# Patient Record
Sex: Female | Born: 1945
Health system: Southern US, Community
[De-identification: ages and names within clinical notes are randomized; demographics above are authoritative.]

## PROBLEM LIST (undated history)

## (undated) DIAGNOSIS — N200 Calculus of kidney: Secondary | ICD-10-CM

## (undated) DIAGNOSIS — Z7901 Long term (current) use of anticoagulants: Secondary | ICD-10-CM

## (undated) DIAGNOSIS — R1013 Epigastric pain: Secondary | ICD-10-CM

## (undated) DIAGNOSIS — I5042 Chronic combined systolic (congestive) and diastolic (congestive) heart failure: Secondary | ICD-10-CM

## (undated) DIAGNOSIS — R079 Chest pain, unspecified: Secondary | ICD-10-CM

## (undated) DIAGNOSIS — K2289 Other specified disease of esophagus: Secondary | ICD-10-CM

## (undated) DIAGNOSIS — I10 Essential (primary) hypertension: Secondary | ICD-10-CM

## (undated) DIAGNOSIS — A419 Sepsis, unspecified organism: Secondary | ICD-10-CM

## (undated) DIAGNOSIS — I5032 Chronic diastolic (congestive) heart failure: Secondary | ICD-10-CM

## (undated) DIAGNOSIS — I5031 Acute diastolic (congestive) heart failure: Secondary | ICD-10-CM

## (undated) DIAGNOSIS — C519 Malignant neoplasm of vulva, unspecified: Secondary | ICD-10-CM

## (undated) DIAGNOSIS — D122 Benign neoplasm of ascending colon: Secondary | ICD-10-CM

## (undated) DIAGNOSIS — K921 Melena: Secondary | ICD-10-CM

## (undated) DIAGNOSIS — Z87442 Personal history of urinary calculi: Secondary | ICD-10-CM

## (undated) DIAGNOSIS — J449 Chronic obstructive pulmonary disease, unspecified: Secondary | ICD-10-CM

## (undated) DIAGNOSIS — I160 Hypertensive urgency: Secondary | ICD-10-CM

## (undated) DIAGNOSIS — A630 Anogenital (venereal) warts: Secondary | ICD-10-CM

## (undated) DIAGNOSIS — Z9289 Personal history of other medical treatment: Secondary | ICD-10-CM

## (undated) DIAGNOSIS — G4733 Obstructive sleep apnea (adult) (pediatric): Secondary | ICD-10-CM

## (undated) DIAGNOSIS — I482 Chronic atrial fibrillation, unspecified: Secondary | ICD-10-CM

## (undated) DIAGNOSIS — I4891 Unspecified atrial fibrillation: Secondary | ICD-10-CM

## (undated) DIAGNOSIS — N209 Urinary calculus, unspecified: Secondary | ICD-10-CM

## (undated) DIAGNOSIS — R0600 Dyspnea, unspecified: Secondary | ICD-10-CM

## (undated) DIAGNOSIS — K219 Gastro-esophageal reflux disease without esophagitis: Secondary | ICD-10-CM

## (undated) DIAGNOSIS — E669 Obesity, unspecified: Secondary | ICD-10-CM

## (undated) DIAGNOSIS — I509 Heart failure, unspecified: Secondary | ICD-10-CM

## (undated) DIAGNOSIS — D124 Benign neoplasm of descending colon: Secondary | ICD-10-CM

## (undated) DIAGNOSIS — Z8489 Family history of other specified conditions: Secondary | ICD-10-CM

## (undated) DIAGNOSIS — R112 Nausea with vomiting, unspecified: Secondary | ICD-10-CM

## (undated) DIAGNOSIS — I4819 Other persistent atrial fibrillation: Secondary | ICD-10-CM

## (undated) DIAGNOSIS — R0602 Shortness of breath: Secondary | ICD-10-CM

## (undated) DIAGNOSIS — E88819 Insulin resistance, unspecified: Secondary | ICD-10-CM

## (undated) DIAGNOSIS — I071 Rheumatic tricuspid insufficiency: Secondary | ICD-10-CM

## (undated) DIAGNOSIS — I340 Nonrheumatic mitral (valve) insufficiency: Secondary | ICD-10-CM

## (undated) DIAGNOSIS — M1990 Unspecified osteoarthritis, unspecified site: Secondary | ICD-10-CM

## (undated) DIAGNOSIS — K228 Other specified diseases of esophagus: Secondary | ICD-10-CM

## (undated) DIAGNOSIS — K5733 Diverticulitis of large intestine without perforation or abscess with bleeding: Secondary | ICD-10-CM

## (undated) DIAGNOSIS — IMO0002 Reserved for concepts with insufficient information to code with codable children: Secondary | ICD-10-CM

## (undated) DIAGNOSIS — K5792 Diverticulitis of intestine, part unspecified, without perforation or abscess without bleeding: Secondary | ICD-10-CM

## (undated) DIAGNOSIS — I119 Hypertensive heart disease without heart failure: Secondary | ICD-10-CM

## (undated) DIAGNOSIS — Z9989 Dependence on other enabling machines and devices: Secondary | ICD-10-CM

## (undated) DIAGNOSIS — C801 Malignant (primary) neoplasm, unspecified: Secondary | ICD-10-CM

## (undated) DIAGNOSIS — D125 Benign neoplasm of sigmoid colon: Secondary | ICD-10-CM

## (undated) DIAGNOSIS — K2970 Gastritis, unspecified, without bleeding: Secondary | ICD-10-CM

## (undated) DIAGNOSIS — I5033 Acute on chronic diastolic (congestive) heart failure: Secondary | ICD-10-CM

## (undated) DIAGNOSIS — R42 Dizziness and giddiness: Secondary | ICD-10-CM

## (undated) DIAGNOSIS — R931 Abnormal findings on diagnostic imaging of heart and coronary circulation: Secondary | ICD-10-CM

## (undated) DIAGNOSIS — E8881 Metabolic syndrome: Secondary | ICD-10-CM

## (undated) DIAGNOSIS — Z9889 Other specified postprocedural states: Secondary | ICD-10-CM

## (undated) DIAGNOSIS — I493 Ventricular premature depolarization: Secondary | ICD-10-CM

## (undated) HISTORY — DX: Reserved for concepts with insufficient information to code with codable children: IMO0002

## (undated) HISTORY — DX: Benign neoplasm of sigmoid colon: D12.5

## (undated) HISTORY — PX: TONSILLECTOMY: SUR1361

## (undated) HISTORY — DX: Anogenital (venereal) warts: A63.0

## (undated) HISTORY — DX: Malignant neoplasm of vulva, unspecified: C51.9

## (undated) HISTORY — PX: CARDIOVERSION: SHX1299

## (undated) HISTORY — DX: Acute diastolic (congestive) heart failure: I50.31

## (undated) HISTORY — DX: Chronic obstructive pulmonary disease, unspecified: J44.9

## (undated) HISTORY — DX: Essential (primary) hypertension: I10

## (undated) HISTORY — DX: Epigastric pain: R10.13

## (undated) HISTORY — DX: Hypertensive urgency: I16.0

## (undated) HISTORY — DX: Malignant (primary) neoplasm, unspecified: C80.1

## (undated) HISTORY — DX: Chronic diastolic (congestive) heart failure: I50.32

## (undated) HISTORY — DX: Sepsis, unspecified organism: A41.9

## (undated) HISTORY — DX: Other specified diseases of esophagus: K22.8

## (undated) HISTORY — DX: Long term (current) use of anticoagulants: Z79.01

## (undated) HISTORY — DX: Heart failure, unspecified: I50.9

## (undated) HISTORY — DX: Acute on chronic diastolic (congestive) heart failure: I50.33

## (undated) HISTORY — DX: Nausea with vomiting, unspecified: R11.2

## (undated) HISTORY — DX: Ventricular premature depolarization: I49.3

## (undated) HISTORY — DX: Gastritis, unspecified, without bleeding: K29.70

## (undated) HISTORY — DX: Obesity, unspecified: E66.9

## (undated) HISTORY — DX: Benign neoplasm of descending colon: D12.4

## (undated) HISTORY — DX: Abnormal findings on diagnostic imaging of heart and coronary circulation: R93.1

## (undated) HISTORY — DX: Other specified disease of esophagus: K22.89

## (undated) HISTORY — DX: Metabolic syndrome: E88.81

## (undated) HISTORY — PX: LITHOTRIPSY: SUR834

## (undated) HISTORY — DX: Chest pain, unspecified: R07.9

## (undated) HISTORY — DX: Insulin resistance, unspecified: E88.819

## (undated) HISTORY — DX: Dizziness and giddiness: R42

## (undated) HISTORY — DX: Diverticulitis of intestine, part unspecified, without perforation or abscess without bleeding: K57.92

## (undated) HISTORY — DX: Melena: K92.1

## (undated) HISTORY — DX: Nonrheumatic mitral (valve) insufficiency: I34.0

## (undated) HISTORY — DX: Other persistent atrial fibrillation: I48.19

## (undated) HISTORY — DX: Obstructive sleep apnea (adult) (pediatric): G47.33

## (undated) HISTORY — PX: APPENDECTOMY: SHX54

## (undated) HISTORY — PX: ABDOMINAL HYSTERECTOMY: SHX81

## (undated) HISTORY — DX: Other specified postprocedural states: Z98.890

## (undated) HISTORY — DX: Personal history of other medical treatment: Z92.89

## (undated) HISTORY — DX: Shortness of breath: R06.02

## (undated) HISTORY — DX: Benign neoplasm of ascending colon: D12.2

## (undated) HISTORY — DX: Chronic combined systolic (congestive) and diastolic (congestive) heart failure: I50.42

## (undated) HISTORY — DX: Urinary calculus, unspecified: N20.9

## (undated) HISTORY — DX: Hypertensive heart disease without heart failure: I11.9

## (undated) HISTORY — DX: Diverticulitis of large intestine without perforation or abscess with bleeding: K57.33

## (undated) HISTORY — DX: Rheumatic tricuspid insufficiency: I07.1

---

## 1981-03-22 DIAGNOSIS — R112 Nausea with vomiting, unspecified: Secondary | ICD-10-CM

## 1981-03-22 HISTORY — DX: Other specified postprocedural states: R11.2

## 2000-05-13 ENCOUNTER — Emergency Department (HOSPITAL_COMMUNITY): Admission: EM | Admit: 2000-05-13 | Discharge: 2000-05-13 | Payer: Self-pay | Admitting: Emergency Medicine

## 2000-05-13 ENCOUNTER — Encounter: Payer: Self-pay | Admitting: Emergency Medicine

## 2000-05-23 ENCOUNTER — Encounter: Payer: Self-pay | Admitting: Urology

## 2000-05-23 ENCOUNTER — Ambulatory Visit (HOSPITAL_BASED_OUTPATIENT_CLINIC_OR_DEPARTMENT_OTHER): Admission: RE | Admit: 2000-05-23 | Discharge: 2000-05-23 | Payer: Self-pay | Admitting: Urology

## 2000-05-23 ENCOUNTER — Encounter: Admission: RE | Admit: 2000-05-23 | Discharge: 2000-05-23 | Payer: Self-pay | Admitting: Urology

## 2000-05-26 ENCOUNTER — Encounter: Payer: Self-pay | Admitting: Urology

## 2000-05-26 ENCOUNTER — Ambulatory Visit (HOSPITAL_COMMUNITY): Admission: RE | Admit: 2000-05-26 | Discharge: 2000-05-26 | Payer: Self-pay | Admitting: Urology

## 2000-06-16 ENCOUNTER — Encounter: Payer: Self-pay | Admitting: Urology

## 2000-06-16 ENCOUNTER — Ambulatory Visit (HOSPITAL_COMMUNITY): Admission: RE | Admit: 2000-06-16 | Discharge: 2000-06-16 | Payer: Self-pay | Admitting: Urology

## 2000-09-27 ENCOUNTER — Ambulatory Visit (HOSPITAL_COMMUNITY): Admission: RE | Admit: 2000-09-27 | Discharge: 2000-09-27 | Payer: Self-pay

## 2000-09-27 ENCOUNTER — Other Ambulatory Visit: Admission: RE | Admit: 2000-09-27 | Discharge: 2000-09-27 | Payer: Self-pay | Admitting: Obstetrics and Gynecology

## 2001-03-22 HISTORY — PX: BILATERAL SALPINGOOPHORECTOMY: SHX1223

## 2001-04-06 ENCOUNTER — Inpatient Hospital Stay (HOSPITAL_COMMUNITY): Admission: AD | Admit: 2001-04-06 | Discharge: 2001-04-09 | Payer: Self-pay | Admitting: Obstetrics and Gynecology

## 2001-04-06 ENCOUNTER — Encounter: Payer: Self-pay | Admitting: Obstetrics and Gynecology

## 2004-06-08 ENCOUNTER — Ambulatory Visit: Payer: Self-pay | Admitting: Internal Medicine

## 2004-06-15 ENCOUNTER — Ambulatory Visit: Payer: Self-pay | Admitting: Internal Medicine

## 2004-07-07 ENCOUNTER — Emergency Department (HOSPITAL_COMMUNITY): Admission: EM | Admit: 2004-07-07 | Discharge: 2004-07-08 | Payer: Self-pay | Admitting: *Deleted

## 2006-06-02 ENCOUNTER — Ambulatory Visit: Payer: Self-pay | Admitting: Family Medicine

## 2006-07-21 ENCOUNTER — Ambulatory Visit: Payer: Self-pay | Admitting: Gastroenterology

## 2007-03-13 ENCOUNTER — Ambulatory Visit: Payer: Self-pay | Admitting: Family Medicine

## 2009-01-09 ENCOUNTER — Ambulatory Visit: Payer: Self-pay | Admitting: Family Medicine

## 2010-04-12 ENCOUNTER — Encounter: Payer: Self-pay | Admitting: *Deleted

## 2012-04-10 ENCOUNTER — Ambulatory Visit: Payer: Self-pay | Admitting: Family Medicine

## 2013-04-16 ENCOUNTER — Ambulatory Visit: Payer: Self-pay | Admitting: Obstetrics and Gynecology

## 2013-04-16 LAB — BASIC METABOLIC PANEL
Anion Gap: 5 — ABNORMAL LOW (ref 7–16)
BUN: 18 mg/dL (ref 7–18)
Calcium, Total: 9.6 mg/dL (ref 8.5–10.1)
Chloride: 103 mmol/L (ref 98–107)
Co2: 27 mmol/L (ref 21–32)
Creatinine: 0.77 mg/dL (ref 0.60–1.30)
EGFR (African American): >60
EGFR (Non-African Amer.): >60
Glucose: 87 mg/dL (ref 65–99)
Osmolality: 271 (ref 275–301)
Potassium: 3.8 mmol/L (ref 3.5–5.1)
Sodium: 135 mmol/L — ABNORMAL LOW (ref 136–145)

## 2013-04-16 LAB — HEMOGLOBIN: HGB: 15.3 g/dL (ref 12.0–16.0)

## 2013-04-23 DIAGNOSIS — R931 Abnormal findings on diagnostic imaging of heart and coronary circulation: Secondary | ICD-10-CM | POA: Insufficient documentation

## 2013-04-23 DIAGNOSIS — I340 Nonrheumatic mitral (valve) insufficiency: Secondary | ICD-10-CM

## 2013-04-23 DIAGNOSIS — I071 Rheumatic tricuspid insufficiency: Secondary | ICD-10-CM

## 2013-04-23 HISTORY — DX: Nonrheumatic mitral (valve) insufficiency: I34.0

## 2013-04-23 HISTORY — DX: Rheumatic tricuspid insufficiency: I07.1

## 2013-06-19 ENCOUNTER — Inpatient Hospital Stay: Payer: Self-pay | Admitting: Cardiology

## 2013-06-19 LAB — CBC WITH DIFFERENTIAL/PLATELET
BASOS PCT: 0.9 %
Basophil #: 0.1 10*3/uL (ref 0.0–0.1)
Eosinophil #: 0.1 10*3/uL (ref 0.0–0.7)
Eosinophil %: 0.8 %
HCT: 43.2 % (ref 35.0–47.0)
HGB: 13.8 g/dL (ref 12.0–16.0)
Lymphocyte #: 2.9 10*3/uL (ref 1.0–3.6)
Lymphocyte %: 22.2 %
MCH: 30.2 pg (ref 26.0–34.0)
MCHC: 31.8 g/dL — AB (ref 32.0–36.0)
MCV: 95 fL (ref 80–100)
Monocyte #: 1.3 x10 3/mm — ABNORMAL HIGH (ref 0.2–0.9)
Monocyte %: 9.8 %
Neutrophil #: 8.6 10*3/uL — ABNORMAL HIGH (ref 1.4–6.5)
Neutrophil %: 66.3 %
Platelet: 259 10*3/uL (ref 150–440)
RBC: 4.56 10*6/uL (ref 3.80–5.20)
RDW: 14.4 % (ref 11.5–14.5)
WBC: 13 10*3/uL — ABNORMAL HIGH (ref 3.6–11.0)

## 2013-06-19 LAB — BASIC METABOLIC PANEL
Anion Gap: 7 (ref 7–16)
BUN: 20 mg/dL — AB (ref 7–18)
CHLORIDE: 105 mmol/L (ref 98–107)
Calcium, Total: 8.9 mg/dL (ref 8.5–10.1)
Co2: 27 mmol/L (ref 21–32)
Creatinine: 1.12 mg/dL (ref 0.60–1.30)
EGFR (African American): 58 — ABNORMAL LOW
EGFR (Non-African Amer.): 50 — ABNORMAL LOW
Glucose: 166 mg/dL — ABNORMAL HIGH (ref 65–99)
Osmolality: 284 (ref 275–301)
Potassium: 3.9 mmol/L (ref 3.5–5.1)
Sodium: 139 mmol/L (ref 136–145)

## 2013-06-19 LAB — CREATININE, SERUM
Creatinine: 1.06 mg/dL (ref 0.60–1.30)
EGFR (Non-African Amer.): 54 — ABNORMAL LOW

## 2013-06-20 LAB — TROPONIN I
TROPONIN-I: 0.04 ng/mL
Troponin-I: 0.07 ng/mL — ABNORMAL HIGH
Troponin-I: 0.04 ng/mL

## 2013-06-20 LAB — CK TOTAL AND CKMB (NOT AT ARMC)
CK, Total: 44 U/L
CK, Total: 40 U/L
CK-MB: 1.5 ng/mL (ref 0.5–3.6)
CK-MB: 1.4 ng/mL (ref 0.5–3.6)

## 2013-06-21 DIAGNOSIS — I509 Heart failure, unspecified: Secondary | ICD-10-CM

## 2013-06-21 DIAGNOSIS — Z9289 Personal history of other medical treatment: Secondary | ICD-10-CM | POA: Insufficient documentation

## 2013-06-21 LAB — CBC WITH DIFFERENTIAL/PLATELET
BASOS ABS: 0.1 10*3/uL (ref 0.0–0.1)
Basophil %: 0.8 %
Eosinophil #: 0.1 10*3/uL (ref 0.0–0.7)
Eosinophil %: 1.6 %
HCT: 41.9 % (ref 35.0–47.0)
HGB: 13.6 g/dL (ref 12.0–16.0)
Lymphocyte #: 2.5 10*3/uL (ref 1.0–3.6)
Lymphocyte %: 26.6 %
MCH: 30.3 pg (ref 26.0–34.0)
MCHC: 32.5 g/dL (ref 32.0–36.0)
MCV: 93 fL (ref 80–100)
Monocyte #: 1.1 x10 3/mm — ABNORMAL HIGH (ref 0.2–0.9)
Monocyte %: 11.7 %
NEUTROS ABS: 5.7 10*3/uL (ref 1.4–6.5)
Neutrophil %: 59.3 %
Platelet: 200 10*3/uL (ref 150–440)
RBC: 4.49 10*6/uL (ref 3.80–5.20)
RDW: 14 % (ref 11.5–14.5)
WBC: 9.6 10*3/uL (ref 3.6–11.0)

## 2013-06-21 LAB — BASIC METABOLIC PANEL
Anion Gap: 5 — ABNORMAL LOW (ref 7–16)
BUN: 18 mg/dL (ref 7–18)
CALCIUM: 8.5 mg/dL (ref 8.5–10.1)
CHLORIDE: 101 mmol/L (ref 98–107)
Co2: 34 mmol/L — ABNORMAL HIGH (ref 21–32)
Creatinine: 1.01 mg/dL (ref 0.60–1.30)
EGFR (African American): >60
EGFR (Non-African Amer.): 57 — ABNORMAL LOW
Glucose: 97 mg/dL (ref 65–99)
Osmolality: 281 (ref 275–301)
Potassium: 3.3 mmol/L — ABNORMAL LOW (ref 3.5–5.1)
SODIUM: 140 mmol/L (ref 136–145)

## 2013-06-21 LAB — CK TOTAL AND CKMB (NOT AT ARMC)
CK, Total: 34 U/L
CK-MB: 1.2 ng/mL (ref 0.5–3.6)

## 2013-06-21 LAB — TROPONIN I: Troponin-I: 0.03 ng/mL

## 2013-06-21 LAB — MAGNESIUM: Magnesium: 1.8 mg/dL

## 2013-06-22 LAB — BASIC METABOLIC PANEL
ANION GAP: 4 — AB (ref 7–16)
BUN: 20 mg/dL — AB (ref 7–18)
CREATININE: 0.99 mg/dL (ref 0.60–1.30)
Calcium, Total: 9 mg/dL (ref 8.5–10.1)
Chloride: 100 mmol/L (ref 98–107)
Co2: 33 mmol/L — ABNORMAL HIGH (ref 21–32)
EGFR (African American): >60
EGFR (Non-African Amer.): 58 — ABNORMAL LOW
Glucose: 113 mg/dL — ABNORMAL HIGH (ref 65–99)
Osmolality: 277 (ref 275–301)
POTASSIUM: 3.7 mmol/L (ref 3.5–5.1)
Sodium: 137 mmol/L (ref 136–145)

## 2013-06-22 LAB — MAGNESIUM: Magnesium: 2 mg/dL

## 2013-06-23 LAB — BASIC METABOLIC PANEL
ANION GAP: 5 — AB (ref 7–16)
BUN: 24 mg/dL — ABNORMAL HIGH (ref 7–18)
CO2: 32 mmol/L (ref 21–32)
Calcium, Total: 9 mg/dL (ref 8.5–10.1)
Chloride: 101 mmol/L (ref 98–107)
Creatinine: 1.07 mg/dL (ref 0.60–1.30)
EGFR (African American): >60
EGFR (Non-African Amer.): 53 — ABNORMAL LOW
GLUCOSE: 130 mg/dL — AB (ref 65–99)
Osmolality: 281 (ref 275–301)
POTASSIUM: 3.8 mmol/L (ref 3.5–5.1)
Sodium: 138 mmol/L (ref 136–145)

## 2013-07-24 ENCOUNTER — Ambulatory Visit: Payer: Self-pay | Admitting: Cardiology

## 2013-08-02 ENCOUNTER — Ambulatory Visit: Payer: Self-pay | Admitting: Cardiology

## 2013-08-03 DIAGNOSIS — I509 Heart failure, unspecified: Secondary | ICD-10-CM | POA: Insufficient documentation

## 2013-08-03 HISTORY — DX: Heart failure, unspecified: I50.9

## 2013-08-20 DIAGNOSIS — R0602 Shortness of breath: Secondary | ICD-10-CM | POA: Insufficient documentation

## 2013-08-20 DIAGNOSIS — R079 Chest pain, unspecified: Secondary | ICD-10-CM

## 2013-08-20 HISTORY — DX: Chest pain, unspecified: R07.9

## 2013-08-20 HISTORY — DX: Shortness of breath: R06.02

## 2014-03-22 DIAGNOSIS — C519 Malignant neoplasm of vulva, unspecified: Secondary | ICD-10-CM

## 2014-03-22 HISTORY — DX: Malignant neoplasm of vulva, unspecified: C51.9

## 2014-03-26 DIAGNOSIS — D398 Neoplasm of uncertain behavior of other specified female genital organs: Secondary | ICD-10-CM | POA: Insufficient documentation

## 2014-03-26 DIAGNOSIS — N3281 Overactive bladder: Secondary | ICD-10-CM | POA: Diagnosis not present

## 2014-04-02 DIAGNOSIS — I5032 Chronic diastolic (congestive) heart failure: Secondary | ICD-10-CM | POA: Diagnosis not present

## 2014-04-02 DIAGNOSIS — I10 Essential (primary) hypertension: Secondary | ICD-10-CM | POA: Diagnosis not present

## 2014-04-02 DIAGNOSIS — I340 Nonrheumatic mitral (valve) insufficiency: Secondary | ICD-10-CM | POA: Diagnosis not present

## 2014-04-02 DIAGNOSIS — I481 Persistent atrial fibrillation: Secondary | ICD-10-CM | POA: Diagnosis not present

## 2014-04-23 DIAGNOSIS — N909 Noninflammatory disorder of vulva and perineum, unspecified: Secondary | ICD-10-CM | POA: Diagnosis not present

## 2014-04-23 DIAGNOSIS — N3281 Overactive bladder: Secondary | ICD-10-CM | POA: Diagnosis not present

## 2014-05-02 ENCOUNTER — Ambulatory Visit: Payer: Self-pay | Admitting: Obstetrics and Gynecology

## 2014-05-02 DIAGNOSIS — R229 Localized swelling, mass and lump, unspecified: Secondary | ICD-10-CM | POA: Diagnosis not present

## 2014-05-02 DIAGNOSIS — Z01812 Encounter for preprocedural laboratory examination: Secondary | ICD-10-CM | POA: Diagnosis not present

## 2014-05-10 ENCOUNTER — Ambulatory Visit: Payer: Self-pay | Admitting: Obstetrics and Gynecology

## 2014-05-10 DIAGNOSIS — G4730 Sleep apnea, unspecified: Secondary | ICD-10-CM | POA: Diagnosis not present

## 2014-05-10 DIAGNOSIS — Z7901 Long term (current) use of anticoagulants: Secondary | ICD-10-CM | POA: Diagnosis not present

## 2014-05-10 DIAGNOSIS — M1990 Unspecified osteoarthritis, unspecified site: Secondary | ICD-10-CM | POA: Diagnosis not present

## 2014-05-10 DIAGNOSIS — C510 Malignant neoplasm of labium majus: Secondary | ICD-10-CM | POA: Diagnosis not present

## 2014-05-10 DIAGNOSIS — Z79899 Other long term (current) drug therapy: Secondary | ICD-10-CM | POA: Diagnosis not present

## 2014-05-10 DIAGNOSIS — Z87442 Personal history of urinary calculi: Secondary | ICD-10-CM | POA: Diagnosis not present

## 2014-05-10 DIAGNOSIS — Z9989 Dependence on other enabling machines and devices: Secondary | ICD-10-CM | POA: Diagnosis not present

## 2014-05-10 DIAGNOSIS — Z87891 Personal history of nicotine dependence: Secondary | ICD-10-CM | POA: Diagnosis not present

## 2014-05-10 DIAGNOSIS — J449 Chronic obstructive pulmonary disease, unspecified: Secondary | ICD-10-CM | POA: Diagnosis not present

## 2014-05-10 DIAGNOSIS — E785 Hyperlipidemia, unspecified: Secondary | ICD-10-CM | POA: Diagnosis not present

## 2014-05-10 DIAGNOSIS — I4891 Unspecified atrial fibrillation: Secondary | ICD-10-CM | POA: Diagnosis not present

## 2014-05-10 DIAGNOSIS — A630 Anogenital (venereal) warts: Secondary | ICD-10-CM | POA: Diagnosis not present

## 2014-05-10 DIAGNOSIS — C519 Malignant neoplasm of vulva, unspecified: Secondary | ICD-10-CM | POA: Insufficient documentation

## 2014-05-10 DIAGNOSIS — K219 Gastro-esophageal reflux disease without esophagitis: Secondary | ICD-10-CM | POA: Diagnosis not present

## 2014-05-10 DIAGNOSIS — I509 Heart failure, unspecified: Secondary | ICD-10-CM | POA: Diagnosis not present

## 2014-05-10 DIAGNOSIS — I10 Essential (primary) hypertension: Secondary | ICD-10-CM | POA: Diagnosis not present

## 2014-05-10 DIAGNOSIS — E669 Obesity, unspecified: Secondary | ICD-10-CM | POA: Diagnosis not present

## 2014-05-10 HISTORY — PX: VULVECTOMY: SHX1086

## 2014-05-10 HISTORY — DX: Malignant neoplasm of vulva, unspecified: C51.9

## 2014-05-21 ENCOUNTER — Ambulatory Visit: Payer: Self-pay | Admitting: Family Medicine

## 2014-05-21 DIAGNOSIS — I10 Essential (primary) hypertension: Secondary | ICD-10-CM | POA: Diagnosis not present

## 2014-05-21 DIAGNOSIS — K219 Gastro-esophageal reflux disease without esophagitis: Secondary | ICD-10-CM | POA: Diagnosis not present

## 2014-05-21 DIAGNOSIS — Z1231 Encounter for screening mammogram for malignant neoplasm of breast: Secondary | ICD-10-CM | POA: Diagnosis not present

## 2014-05-29 ENCOUNTER — Ambulatory Visit
Admit: 2014-05-29 | Disposition: A | Discharge status: Home / Self Care | Payer: Self-pay | Attending: Obstetrics and Gynecology | Admitting: Obstetrics and Gynecology

## 2014-05-29 DIAGNOSIS — A630 Anogenital (venereal) warts: Secondary | ICD-10-CM | POA: Diagnosis not present

## 2014-05-29 DIAGNOSIS — C519 Malignant neoplasm of vulva, unspecified: Secondary | ICD-10-CM | POA: Diagnosis not present

## 2014-05-29 DIAGNOSIS — R87629 Unspecified abnormal cytological findings in specimens from vagina: Secondary | ICD-10-CM | POA: Diagnosis not present

## 2014-05-29 DIAGNOSIS — Z7901 Long term (current) use of anticoagulants: Secondary | ICD-10-CM | POA: Diagnosis not present

## 2014-05-29 DIAGNOSIS — I4891 Unspecified atrial fibrillation: Secondary | ICD-10-CM | POA: Diagnosis not present

## 2014-05-29 DIAGNOSIS — Z79899 Other long term (current) drug therapy: Secondary | ICD-10-CM | POA: Diagnosis not present

## 2014-06-19 DIAGNOSIS — C519 Malignant neoplasm of vulva, unspecified: Secondary | ICD-10-CM

## 2014-06-19 HISTORY — DX: Malignant neoplasm of vulva, unspecified: C51.9

## 2014-06-20 DIAGNOSIS — C519 Malignant neoplasm of vulva, unspecified: Secondary | ICD-10-CM | POA: Diagnosis not present

## 2014-06-20 DIAGNOSIS — Z01818 Encounter for other preprocedural examination: Secondary | ICD-10-CM | POA: Diagnosis not present

## 2014-06-20 DIAGNOSIS — Z5181 Encounter for therapeutic drug level monitoring: Secondary | ICD-10-CM | POA: Diagnosis not present

## 2014-06-20 DIAGNOSIS — E8881 Metabolic syndrome: Secondary | ICD-10-CM | POA: Diagnosis not present

## 2014-06-20 DIAGNOSIS — Z7901 Long term (current) use of anticoagulants: Secondary | ICD-10-CM | POA: Diagnosis not present

## 2014-06-20 DIAGNOSIS — Z79899 Other long term (current) drug therapy: Secondary | ICD-10-CM | POA: Diagnosis not present

## 2014-06-20 DIAGNOSIS — I481 Persistent atrial fibrillation: Secondary | ICD-10-CM | POA: Diagnosis not present

## 2014-06-20 DIAGNOSIS — I10 Essential (primary) hypertension: Secondary | ICD-10-CM | POA: Diagnosis not present

## 2014-06-20 DIAGNOSIS — I361 Nonrheumatic tricuspid (valve) insufficiency: Secondary | ICD-10-CM | POA: Diagnosis not present

## 2014-06-20 DIAGNOSIS — I509 Heart failure, unspecified: Secondary | ICD-10-CM | POA: Diagnosis not present

## 2014-06-20 DIAGNOSIS — I340 Nonrheumatic mitral (valve) insufficiency: Secondary | ICD-10-CM | POA: Diagnosis not present

## 2014-06-21 ENCOUNTER — Ambulatory Visit
Admit: 2014-06-21 | Disposition: A | Discharge status: Home / Self Care | Payer: Self-pay | Attending: Obstetrics and Gynecology | Admitting: Obstetrics and Gynecology

## 2014-07-01 DIAGNOSIS — Z79899 Other long term (current) drug therapy: Secondary | ICD-10-CM | POA: Diagnosis not present

## 2014-07-01 DIAGNOSIS — E8881 Metabolic syndrome: Secondary | ICD-10-CM | POA: Diagnosis not present

## 2014-07-01 DIAGNOSIS — D398 Neoplasm of uncertain behavior of other specified female genital organs: Secondary | ICD-10-CM | POA: Diagnosis not present

## 2014-07-01 DIAGNOSIS — N904 Leukoplakia of vulva: Secondary | ICD-10-CM | POA: Diagnosis not present

## 2014-07-01 DIAGNOSIS — I10 Essential (primary) hypertension: Secondary | ICD-10-CM | POA: Diagnosis not present

## 2014-07-01 DIAGNOSIS — J449 Chronic obstructive pulmonary disease, unspecified: Secondary | ICD-10-CM | POA: Diagnosis not present

## 2014-07-01 DIAGNOSIS — I340 Nonrheumatic mitral (valve) insufficiency: Secondary | ICD-10-CM | POA: Diagnosis not present

## 2014-07-01 DIAGNOSIS — I481 Persistent atrial fibrillation: Secondary | ICD-10-CM | POA: Diagnosis not present

## 2014-07-01 DIAGNOSIS — E785 Hyperlipidemia, unspecified: Secondary | ICD-10-CM | POA: Diagnosis not present

## 2014-07-01 DIAGNOSIS — I509 Heart failure, unspecified: Secondary | ICD-10-CM | POA: Diagnosis not present

## 2014-07-01 DIAGNOSIS — Z720 Tobacco use: Secondary | ICD-10-CM | POA: Diagnosis not present

## 2014-07-01 DIAGNOSIS — Z6841 Body Mass Index (BMI) 40.0 and over, adult: Secondary | ICD-10-CM | POA: Diagnosis not present

## 2014-07-02 DIAGNOSIS — Z6841 Body Mass Index (BMI) 40.0 and over, adult: Secondary | ICD-10-CM | POA: Diagnosis not present

## 2014-07-02 DIAGNOSIS — Z79899 Other long term (current) drug therapy: Secondary | ICD-10-CM | POA: Diagnosis not present

## 2014-07-02 DIAGNOSIS — L900 Lichen sclerosus et atrophicus: Secondary | ICD-10-CM | POA: Diagnosis not present

## 2014-07-02 DIAGNOSIS — I10 Essential (primary) hypertension: Secondary | ICD-10-CM | POA: Diagnosis not present

## 2014-07-02 DIAGNOSIS — E8881 Metabolic syndrome: Secondary | ICD-10-CM | POA: Diagnosis not present

## 2014-07-02 DIAGNOSIS — I340 Nonrheumatic mitral (valve) insufficiency: Secondary | ICD-10-CM | POA: Diagnosis not present

## 2014-07-02 DIAGNOSIS — Z720 Tobacco use: Secondary | ICD-10-CM | POA: Diagnosis not present

## 2014-07-02 DIAGNOSIS — I509 Heart failure, unspecified: Secondary | ICD-10-CM | POA: Diagnosis not present

## 2014-07-02 DIAGNOSIS — J449 Chronic obstructive pulmonary disease, unspecified: Secondary | ICD-10-CM | POA: Diagnosis not present

## 2014-07-02 DIAGNOSIS — I481 Persistent atrial fibrillation: Secondary | ICD-10-CM | POA: Diagnosis not present

## 2014-07-02 DIAGNOSIS — N904 Leukoplakia of vulva: Secondary | ICD-10-CM | POA: Diagnosis not present

## 2014-07-02 DIAGNOSIS — C519 Malignant neoplasm of vulva, unspecified: Secondary | ICD-10-CM | POA: Diagnosis not present

## 2014-07-02 DIAGNOSIS — E785 Hyperlipidemia, unspecified: Secondary | ICD-10-CM | POA: Diagnosis not present

## 2014-07-02 HISTORY — PX: VULVECTOMY PARTIAL: SHX6187

## 2014-07-03 DIAGNOSIS — I340 Nonrheumatic mitral (valve) insufficiency: Secondary | ICD-10-CM | POA: Diagnosis not present

## 2014-07-03 DIAGNOSIS — E8881 Metabolic syndrome: Secondary | ICD-10-CM | POA: Diagnosis not present

## 2014-07-03 DIAGNOSIS — I509 Heart failure, unspecified: Secondary | ICD-10-CM | POA: Diagnosis not present

## 2014-07-03 DIAGNOSIS — Z79899 Other long term (current) drug therapy: Secondary | ICD-10-CM | POA: Diagnosis not present

## 2014-07-03 DIAGNOSIS — E785 Hyperlipidemia, unspecified: Secondary | ICD-10-CM | POA: Diagnosis not present

## 2014-07-03 DIAGNOSIS — Z720 Tobacco use: Secondary | ICD-10-CM | POA: Diagnosis not present

## 2014-07-03 DIAGNOSIS — N904 Leukoplakia of vulva: Secondary | ICD-10-CM | POA: Diagnosis not present

## 2014-07-03 DIAGNOSIS — Z6841 Body Mass Index (BMI) 40.0 and over, adult: Secondary | ICD-10-CM | POA: Diagnosis not present

## 2014-07-03 DIAGNOSIS — I10 Essential (primary) hypertension: Secondary | ICD-10-CM | POA: Diagnosis not present

## 2014-07-03 DIAGNOSIS — I481 Persistent atrial fibrillation: Secondary | ICD-10-CM | POA: Diagnosis not present

## 2014-07-03 DIAGNOSIS — J449 Chronic obstructive pulmonary disease, unspecified: Secondary | ICD-10-CM | POA: Diagnosis not present

## 2014-07-12 DIAGNOSIS — C519 Malignant neoplasm of vulva, unspecified: Secondary | ICD-10-CM | POA: Diagnosis not present

## 2014-07-13 NOTE — Discharge Summary (Signed)
PATIENT NAME:  Holly Smith, Holly Smith MR#:  [id number] DATE OF BIRTH:  05-06-1945  DATE OF ADMISSION:  06/19/2013 DATE OF DISCHARGE:  06/24/2013  FINAL DIAGNOSES: 1.  Congestive heart failure.  2.  Atrial fibrillation  3.  Probable sleep apnea.   DISCHARGE MEDICATIONS:  Xarelto 20 mg daily, amiodarone 400 mg b.i.d., furosemide 40 mg daily p.r.n., lisinopril 2.5 mg daily, VESIcare 5 mg daily, clobetasol topical 0.05% topical cream daily.   PROCEDURES:   1.  Transesophageal echocardiogram 06/19/13.  2.  Cardioversion 06/19/13.   HISTORY OF PRESENT ILLNESS: Please see admission H and Kim COURSE: The patient underwent elective transesophageal echocardiogram and elective cardioversion on 06/19/13 by Dr. Nehemiah Massed. The patient was converted to sinus rhythm; however, the patient developed hypoxia and congestive heart failure following cardioversion. The patient has known moderately reduced left ventricular function with LV ejection fraction of 35%. The patient returned back into atrial fibrillation with a rapid ventricular rate. The patient was treated with an amiodarone bolus and drip. The patient was seen in consultation by Dr. Lavetta Nielsen. The patient was treated with intravenous furosemide with diuresis and overall clinical improvement. While on amiodarone drip, the patient converted to sinus rhythm. The patient was started on amiodarone 400 mg b.i.d. which has been tolerated well. However, the patient again returned to atrial fibrillation with a reasonable ventricular rate of 100 to 110 BPM. The patient is asymptomatic, denies chest pain or palpitations. The patient currently denies shortness of breath and has undergone effective diuresis, has not been on any diuretic for the last 24 to 48 hours.   The patient will be discharged home on amiodarone 400 mg b.i.d. She will be seen in followup this week. The patient will either undergo repeat cardioversion or be referred to [facility] electrophysiology for  consideration of catheter ablation of atrial fibrillation. She was discharged home in stable condition.    ____________________________ Isaias Cowman, MD ap:cs D: 06/24/2013 13:22:29 ET T: 06/24/2013 18:46:49 ET JOB#: [id number]  cc: Isaias Cowman, MD, <Dictator> Isaias Cowman MD ELECTRONICALLY SIGNED 07/24/2013 12:43

## 2014-07-13 NOTE — Consult Note (Signed)
PATIENT NAME:  Holly Smith, Holly Smith MR#:  [id number] DATE OF BIRTH:  09-Nov-1945  DATE OF CONSULTATION:  06/19/2013  REFERRING PHYSICIAN:  Serafina Royals, MD CONSULTING PHYSICIAN:  Aaron Mose. Hower, MD  REASON FOR CONSULTATION:  Respiratory distress.   HISTORY OF PRESENT ILLNESS:  This is a 69 year old Caucasian female with past medical history of atrial fibrillation who presented to the hospital on March 31st for DC cardioversion per Dr. Nehemiah Massed, underwent successful DC cardioversion and is in normal sinus rhythm; however, after cardioversion she became acutely short of breath and hypoxemic, requiring supplemental O2.  He asked for consult for her dual onset of hypoxemia and respiratory distress.  She has received Lasix thus far with some minimal improvement.  She is currently on 6 liters nasal cannula, saturating in the upper 90s.  Apparently they attempted BiPAP therapy.  She was unable to tolerate this secondary to anxiety and agitation.  She still continues to complain of shortness of breath, however denies any chest pain, palpitations.  Denies any recent lower extremity edema, orthopnea, cough, fevers, chills, or any further symptomatology.   REVIEW OF SYSTEMS:  CONSTITUTIONAL:  Denies fever, fatigue, weakness.  EYES:  Denies blurred vision, double vision, eye pain.  EARS, NOSE, THROAT:  Denies tinnitus, ear pain, hearing loss.  RESPIRATORY:  Positive for shortness of breath.  Denies cough or wheeze.  CARDIOVASCULAR:  Denies chest pain, palpitations, orthopnea, edema.  GASTROINTESTINAL:  Denies nausea, vomiting, diarrhea, abdominal pain.  GENITOURINARY:  Denies dysuria, hematuria.  ENDOCRINE:  Denies nocturia or thyroid problems.  HEMATOLOGIC AND LYMPHATIC:  Denies easy bruising, bleeding.  SKIN:  Denies rashes or lesions.  MUSCULOSKELETAL:  Denies pain in neck, back, shoulder, knees, hips or arthritic symptoms. NEUROLOGIC:  Denies paralysis, paresthesia.  PSYCHIATRIC:  Positive for anxiety.   Denies depressive symptoms.   PAST MEDICAL HISTORY:  Atrial fibrillation.  Denies any other history.  She recently had "some breathing test" and may be diagnosed with COPD.  SOCIAL HISTORY:  Denies any alcohol or drug usage.   FAMILY HISTORY:  Denies any cardiovascular or pulmonary illnesses.   ALLERGIES:  No known drug allergies.   HOME MEDICATIONS:  Include Remedol 225 mg by mouth daily, Xarelto 20 mg by mouth daily, metoprolol 50 mg by mouth twice daily, VESIcare 5 mg by mouth daily.   PHYSICAL EXAMINATION: VITAL SIGNS:  Temperature 97.8, heart rate 90, respirations 28, blood pressure 168/112, saturating 96% on 6 liters nasal cannula.  Weight 95.9 kg, BMI at 42.7.  GENERAL:  Well-nourished, well-developed, Caucasian female, currently in moderate distress given respiratory status.  HEAD:  Normocephalic, atraumatic.  EYES:  Pupils equal, round, reactive to light.  Extraocular muscles intact.  No scleral icterus.  MOUTH:  Moist mucosal membranes.  Dentition intact.  No abscess noted.  EAR, NOSE, THROAT:  Throat clear without exudates.  No external lesions.  NECK:  Supple.  No thyromegaly.  No nodules.  No JVD.  PULMONARY:  Diffuse coarse breath sounds bilaterally with scattered rhonchi, she is tachypneic though without use of accessory muscles.  Good respiratory effort.  CHEST:  Nontender to palpation.  CARDIOVASCULAR:  S1, S2, regular rate and rhythm.  No murmurs, rubs, or gallops.  No edema.  Pedal pulses 2+ bilaterally.  GASTROINTESTINAL:  Soft, nontender, nondistended.  No masses.  Positive bowel sounds.  No hepatosplenomegaly.  MUSCULOSKELETAL:  No swelling, clubbing, edema.  Range of motion full in all extremities. NEUROLOGIC:  Cranial nerves II through XII intact.  No gross focal neurological  deficits.  Sensation intact.  Reflexes intact.  SKIN:  No ulcerations, lesions, rashes, cyanosis.  Skin warm, dry.  Turgor intact.  PSYCHIATRIC:  Mood and affect, appears anxious and agitated.   She is awake, alert, oriented x 3.  Insight and judgment intact.   LABORATORY DATA:  Sodium 139, potassium 3.9, chloride 105, bicarb 27, BUN 20, creatinine 1.12, glucose 166.  WBC is 13, hemoglobin 13.8, platelets of 259.  ABG performed, 7.29, CO2 of 54, O2 of 100.  TEE performed earlier today reveals EF of 30% to 35% with moderately decreased global left ventricular systolic dysfunction, mildly dilated left and right atria, mild to moderate mitral and tricuspid regurgitation.   ASSESSMENT AND PLAN:  A 69 year old Caucasian female with history of atrial fibrillation who originally presented for direct current cardioversion, had a successful cardioversion, is now short of breath.  1.  Acute systolic congestive heart failure with ejection fraction of 30% to 35%.  Continue diuresis with Lasix.  Follow ins and outs and daily weights.  Given she is acutely short of breath now we will give Lasix 40 mg intravenous x 1 as well as sublingual nitroglycerin as her blood pressure allows.  Provide DuoNeb q. 4 hours, supplemental O2 to keep oxygen saturation greater than 92%, BiPAP therapy 12/6.  She states that she was previously anxious on BiPAP and thus could not use this.  Give a small dose of Xanax 0.5 mg by mouth once with the initiation of BiPAP therapy if required.   2.  Atrial fibrillation status post DC cardioversion per cardiology, is currently in normal sinus rhythm.  Continue anticoagulation with Xarelto.  3.  Leukocytosis.  No evidence of infection at this time.  This is likely related to her cardioversion.  No antibiotics for now.  4.  Venous thromboembolism prophylaxis with Xarelto.  5.  CODE STATUS:  THE PATIENT IS A FULL CODE.   TIME SPENT:  45 minutes.    ____________________________ Aaron Mose. Hower, MD dkh:ea D: 06/20/2013 00:20:20 ET T: 06/20/2013 01:20:17 ET JOB#: [id number]  cc: Aaron Mose. Hower, MD, <Dictator> DAVID Woodfin Ganja MD ELECTRONICALLY SIGNED 06/27/2013 20:18

## 2014-07-15 LAB — SURGICAL PATHOLOGY

## 2014-07-21 NOTE — Op Note (Signed)
PATIENT NAME:  Holly Smith, Holly Smith MR#:  [id number] DATE OF BIRTH:  1945-10-24  DATE OF PROCEDURE:  05/10/2014  PREOPERATIVE DIAGNOSIS: Right labial mass.   POSTOPERATIVE DIAGNOSIS: Right labial mass.   PROCEDURE: Excisional biopsy of the superior right labial majora mass.   SURGEON: Boykin Nearing, MD  ANESTHESIA:  1. Monitored anesthesia care.  2. Local lidocaine injection.   INDICATION: A 69 year old with a 2-year right labia majora mass. 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 Lower abdomen and vagina were prepped and draped in normal sterile fashion. Bladder was catheterized, yielding 100 mL of clear urine. The superior aspect of the right labia majora mass was infiltrated with 1% lidocaine with 1:100,000 epinephrine. The lesion measures 4 x 3 cm. An elliptical incision was made, with total removal of the mass. A defect of approximately 7 x 4 cm. The defect was closed in 2 layers with a 3-0 Vicryl interrupted subcutaneous closure. The skin was reapproximated with a 4-0 Vicryl subcuticular running suture. Good hemostasis was noted. Bacitracin was applied to the incision, followed by a piece of Telfa and Tegaderm. There were no complications.   ESTIMATED BLOOD LOSS: Minimal.   INTRAOPERATIVE FLUIDS: 400 mL.   The patient tolerated the procedure well and was taken to the recovery room in good condition.    ____________________________ Boykin Nearing, MD tjs:mw D: 05/10/2014 11:24:24 ET T: 05/10/2014 11:52:47 ET JOB#: [id number]  cc: Boykin Nearing, MD, <Dictator> Boykin Nearing MD ELECTRONICALLY SIGNED 05/10/2014 19:05

## 2014-08-02 ENCOUNTER — Other Ambulatory Visit: Payer: Self-pay | Admitting: *Deleted

## 2014-08-02 ENCOUNTER — Encounter: Payer: Self-pay | Admitting: *Deleted

## 2014-08-02 DIAGNOSIS — A5131 Condyloma latum: Secondary | ICD-10-CM | POA: Insufficient documentation

## 2014-08-02 DIAGNOSIS — A630 Anogenital (venereal) warts: Secondary | ICD-10-CM

## 2014-08-02 DIAGNOSIS — Z7901 Long term (current) use of anticoagulants: Secondary | ICD-10-CM

## 2014-08-02 DIAGNOSIS — Z720 Tobacco use: Secondary | ICD-10-CM | POA: Insufficient documentation

## 2014-08-02 DIAGNOSIS — I482 Chronic atrial fibrillation, unspecified: Secondary | ICD-10-CM | POA: Insufficient documentation

## 2014-08-02 HISTORY — DX: Anogenital (venereal) warts: A63.0

## 2014-08-02 HISTORY — DX: Long term (current) use of anticoagulants: Z79.01

## 2014-08-07 ENCOUNTER — Inpatient Hospital Stay: Payer: Medicare Other | Attending: Obstetrics and Gynecology | Admitting: Obstetrics and Gynecology

## 2014-08-07 VITALS — BP 112/60 | HR 70 | Temp 97.9°F | Resp 18 | Ht 61.0 in | Wt 227.1 lb

## 2014-08-07 DIAGNOSIS — C519 Malignant neoplasm of vulva, unspecified: Secondary | ICD-10-CM

## 2014-08-07 NOTE — Patient Instructions (Signed)
Exercise to Lose Weight Exercise and a healthy diet may help you lose weight. Your doctor may suggest specific exercises. EXERCISE IDEAS AND TIPS  Choose low-cost things you enjoy doing, such as walking, bicycling, or exercising to workout videos.  Take stairs instead of the elevator.  Walk during your lunch break.  Park your car further away from work or school.  Go to a gym or an exercise class.  Start with 5 to 10 minutes of exercise each day. Build up to 30 minutes of exercise 4 to 6 days a week.  Wear shoes with good support and comfortable clothes.  Stretch before and after working out.  Work out until you breathe harder and your heart beats faster.  Drink extra water when you exercise.  Do not do so much that you hurt yourself, feel dizzy, or get very short of breath. Exercises that burn about 150 calories:  Running 1  miles in 15 minutes.  Playing volleyball for 45 to 60 minutes.  Washing and waxing a car for 45 to 60 minutes.  Playing touch football for 45 minutes.  Walking 1  miles in 35 minutes.  Pushing a stroller 1  miles in 30 minutes.  Playing basketball for 30 minutes.  Raking leaves for 30 minutes.  Bicycling 5 miles in 30 minutes.  Walking 2 miles in 30 minutes.  Dancing for 30 minutes.  Shoveling snow for 15 minutes.  Swimming laps for 20 minutes.  Walking up stairs for 15 minutes.  Bicycling 4 miles in 15 minutes.  Gardening for 30 to 45 minutes.  Jumping rope for 15 minutes.  Washing windows or floors for 45 to 60 minutes. Document Released: 04/10/2010 Document Revised: 05/31/2011 Document Reviewed: 04/10/2010 Exit[employer] Patient Information 2015 [employer]. This information is not intended to replace advice given to you by your health care provider. Make sure you discuss any questions you have with your health care provider. Calorie Counting for Weight Loss Calories are energy you get from the things you eat and drink. Your  body uses this energy to keep you going throughout the day. The number of calories you eat affects your weight. When you eat more calories than your body needs, your body stores the extra calories as fat. When you eat fewer calories than your body needs, your body burns fat to get the energy it needs. Calorie counting means keeping track of how many calories you eat and drink each day. If you make sure to eat fewer calories than your body needs, you should lose weight. In order for calorie counting to work, you will need to eat the number of calories that are right for you in a day to lose a healthy amount of weight per week. A healthy amount of weight to lose per week is usually 1-2 lb (0.5-0.9 kg). A dietitian can determine how many calories you need in a day and give you suggestions on how to reach your calorie goal.  WHAT IS MY MY PLAN? My goal is to have __________ calories per day.  If I have this many calories per day, I should lose around __________ pounds per week. WHAT DO I NEED TO KNOW ABOUT CALORIE COUNTING? In order to meet your daily calorie goal, you will need to:  Find out how many calories are in each food you would like to eat. Try to do this before you eat.  Decide how much of the food you can eat.  Write down what you ate and   how many calories it had. Doing this is called keeping a food log. WHERE DO I FIND CALORIE INFORMATION? The number of calories in a food can be found on a Nutrition Facts label. Note that all the information on a label is based on a specific serving of the food. If a food does not have a Nutrition Facts label, try to look up the calories online or ask your dietitian for help. HOW DO I DECIDE HOW MUCH TO EAT? To decide how much of the food you can eat, you will need to consider both the number of calories in one serving and the size of one serving. This information can be found on the Nutrition Facts label. If a food does not have a Nutrition Facts label, look  up the information online or ask your dietitian for help. Remember that calories are listed per serving. If you choose to have more than one serving of a food, you will have to multiply the calories per serving by the amount of servings you plan to eat. For example, the label on a package of bread might say that a serving size is 1 slice and that there are 90 calories in a serving. If you eat 1 slice, you will have eaten 90 calories. If you eat 2 slices, you will have eaten 180 calories. HOW DO I KEEP A FOOD LOG? After each meal, record the following information in your food log:  What you ate.  How much of it you ate.  How many calories it had.  Then, add up your calories. Keep your food log near you, such as in a small notebook in your pocket. Another option is to use a mobile app or website. Some programs will calculate calories for you and show you how many calories you have left each time you add an item to the log. WHAT ARE SOME CALORIE COUNTING TIPS?  Use your calories on foods and drinks that will fill you up and not leave you hungry. Some examples of this include foods like nuts and nut butters, vegetables, lean proteins, and high-fiber foods (more than 5 g fiber per serving).  Eat nutritious foods and avoid empty calories. Empty calories are calories you get from foods or beverages that do not have many nutrients, such as candy and soda. It is better to have a nutritious high-calorie food (such as an avocado) than a food with few nutrients (such as a bag of chips).  Know how many calories are in the foods you eat most often. This way, you do not have to look up how many calories they have each time you eat them.  Look out for foods that may seem like low-calorie foods but are really high-calorie foods, such as baked goods, soda, and fat-free candy.  Pay attention to calories in drinks. Drinks such as sodas, specialty coffee drinks, alcohol, and juices have a lot of calories yet do  not fill you up. Choose low-calorie drinks like water and diet drinks.  Focus your calorie counting efforts on higher calorie items. Logging the calories in a garden salad that contains only vegetables is less important than calculating the calories in a milk shake.  Find a way of tracking calories that works for you. Get creative. Most people who are successful find ways to keep track of how much they eat in a day, even if they do not count every calorie. WHAT ARE SOME PORTION CONTROL TIPS?  Know how many calories are in a   serving. This will help you know how many servings of a certain food you can have.  Use a measuring cup to measure serving sizes. This is helpful when you start out. With time, you will be able to estimate serving sizes for some foods.  Take some time to put servings of different foods on your favorite plates, bowls, and cups so you know what a serving looks like.  Try not to eat straight from a bag or box. Doing this can lead to overeating. Put the amount you would like to eat in a cup or on a plate to make sure you are eating the right portion.  Use smaller plates, glasses, and bowls to prevent overeating. This is a quick and easy way to practice portion control. If your plate is smaller, less food can fit on it.  Try not to multitask while eating, such as watching TV or using your computer. If it is time to eat, sit down at a table and enjoy your food. Doing this will help you to start recognizing when you are full. It will also make you more aware of what and how much you are eating. HOW CAN I CALORIE COUNT WHEN EATING OUT?  Ask for smaller portion sizes or child-sized portions.  Consider sharing an entree and sides instead of getting your own entree.  If you get your own entree, eat only half. Ask for a box at the beginning of your meal and put the rest of your entree in it so you are not tempted to eat it.  Look for the calories on the menu. If calories are listed,  choose the lower calorie options.  Choose dishes that include vegetables, fruits, whole grains, low-fat dairy products, and lean protein. Focusing on smart food choices from each of the 5 food groups can help you stay on track at restaurants.  Choose items that are boiled, broiled, grilled, or steamed.  Choose water, milk, unsweetened iced tea, or other drinks without added sugars. If you want an alcoholic beverage, choose a lower calorie option. For example, a regular margarita can have up to 700 calories and a glass of wine has around 150.  Stay away from items that are buttered, battered, fried, or served with cream sauce. Items labeled "crispy" are usually fried, unless stated otherwise.  Ask for dressings, sauces, and syrups on the side. These are usually very high in calories, so do not eat much of them.  Watch out for salads. Many people think salads are a healthy option, but this is often not the case. Many salads come with bacon, fried chicken, lots of cheese, fried chips, and dressing. All of these items have a lot of calories. If you want a salad, choose a garden salad and ask for grilled meats or steak. Ask for the dressing on the side, or ask for olive oil and vinegar or lemon to use as dressing.  Estimate how many servings of a food you are given. For example, a serving of cooked rice is  cup or about the size of half a tennis ball or one cupcake wrapper. Knowing serving sizes will help you be aware of how much food you are eating at restaurants. The list below tells you how big or small some common portion sizes are based on everyday objects.  1 oz--4 stacked dice.  3 oz--1 deck of cards.  1 tsp--1 dice.  1 Tbsp-- a Ping-Pong ball.  2 Tbsp--1 Ping-Pong ball.   cup--1 tennis ball   or 1 cupcake wrapper.  1 cup--1 baseball. Document Released: 03/08/2005 Document Revised: 07/23/2013 Document Reviewed: 01/11/2013 [employer] Patient Information 2015 [employer]. This  information is not intended to replace advice given to you by your health care provider. Make sure you discuss any questions you have with your health care provider.  

## 2014-08-07 NOTE — Progress Notes (Signed)
Gynecologic Oncology Interval Note  Referring MD: Dr. Laverta Baltimore.  Chief Complaint: Vulvar cancer postoperative visit.   Subjective:  Holly Smith is a 69 y.o. woman.  Patient presents today for discussion of pathology regarding her stage IB  Verrucous vulvar cancer.   On 07/02/2014 she underwent Partial right modified radical vulvectomy and right inguinal sentinel node mapping, and biopsy.    DIAGNOSIS    A. Right inguinal sentinel lymph node, excision:  One lymph node, negative for malignancy (0/1).   B. Right inguinal extra lymph node node, excision:  Benign fibroadipose tissue. No lymph node identified.  Note: The specimen was entirely submitted.   C. Right sentinel lymph node, excision:  Two lymph nodes, negative for malignancy (0/2).   D. Right vulva, partial vulvectomy:  Skin and subcutaneous tissue with: - Lichen sclerosus - Post-surgical changes.  - Benign adnexal proliferations. No evidence of residual carcinoma.  See comment.   Her drain came out spontaneously.    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 She underwent Wide local excision of right vulvaon 05/10/2014 and pathology revealed a 3.2 cm tumor,  with 2.4 mm invasion. She opted to undergo sentinel lymph node mapping and biopsy with partial vulvectomy because the tumor has > 72mm invasion.    Problem List: Patient Active Problem List   Diagnosis Date Noted  . Genital warts 08/02/2014  . Condyloma acuminatum of vulva 08/02/2014  . Condylomata lata of vulva 08/02/2014  . History of anticoagulant therapy 08/02/2014  . Atrial fibrillation, persistent 08/02/2014  . Current tobacco use 08/02/2014  . 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 Vulvar cancer 05/10/2014  . Vulvar cancer, carcinoma 05/10/2014    Past Medical History: Past Medical History  Diagnosis Date  . Vulvar cancer     invasive  verrucous carcinoma of the vulva. History of genital warts and vulvar condyloma  . HPV (human papilloma virus) anogenital infection     03/21/12 PAP + HR HPV  . Abnormal Pap smear of vagina and vaginal HPV   . Hypertension   . COPD (chronic obstructive pulmonary disease)   . Obesity   . Chronic anticoagulation     She is on Xarelto for Afib  . Atrial fibrillation   . Echocardiogram abnormal     Mitral regurgitation, tricuspic regurgitation  . Sleep apnea   . Urolithiasis   . Postoperative nausea and vomiting   . CHF (congestive heart failure)   . Insulin resistance   . Hyperlipidemia     Past Surgical History: Past Surgical History  Procedure Laterality Date  . Vulvectomy Right 05/10/2014    Excisional biopsy of the superior right labial majora mass  . Abdominal hysterectomy    . Bilateral salpingoophorectomy  2003    benign ovarian cancer  . Lithotripsy    . Vulvectomy partial  07/02/2014    Re-excision and sentinel node dissection at [facility].     Family History: Family History  Problem Relation Age of Onset  . Prostate cancer Brother 90    still living and well    Social History: History   Social History  . Marital Status: Married    Spouse Name: N/A  . Number of Children: N/A  . Years of Education: N/A   Occupational History  . Not on file.   Social History Main Topics  . Smoking status: Former Smoker -- 1.00 packs/day for 50 years    Types:  Cigarettes, E-cigarettes  . Smokeless tobacco: Not on file  . Alcohol Use: No  . Drug Use: No  . Sexual Activity: Not on file   Other Topics Concern  . Not on file   Social History Narrative    Past Gynecologic History:  Menarche: 10 Last Menstrual Period: 1982 History of OCP/HRT use: none History of Abnormal pap: none per patient. Noted a history of high risk HPV in the chart Last pap: 05/29/2014, normal History of STDs: The patient denies history of sexually transmitted disease. Contraception: status post  hysterectomy       Allergies: No Known Allergies  Current Medications: Current Outpatient Prescriptions  Medication Sig Dispense Refill  . furosemide (LASIX) 40 MG tablet Take 1 tablet by mouth daily.    Marland Kitchen lisinopril-hydrochlorothiazide (PRINZIDE,ZESTORETIC) 10-12.5 MG per tablet Take 1 tablet by mouth daily.    . metoprolol tartrate (LOPRESSOR) 25 MG tablet Take 1 tablet by mouth daily.    Marland Kitchen omeprazole (PRILOSEC) 20 MG capsule Take 20 mg by mouth daily as needed. For GERD    . rivaroxaban (XARELTO) 20 MG TABS tablet Take 20 mg by mouth daily.    . solifenacin (VESICARE) 5 MG tablet Take 1 tablet by mouth daily.    . clobetasol cream (TEMOVATE) 2.11 % Apply 1 application topically 2 (two) times a week.     No current facility-administered medications for this visit.      Review of Systems A comprehensive review of systems was negative other than shortness of breath due to her other medical issues and irritation and pain by the groin and upper vulvar incisions.  Objective:  Physical Examination:  BP 112/60 mmHg  Pulse 70  Temp(Src) 97.9 F (36.6 C) (Tympanic)  Resp 18  Ht 5\' 1"  (1.549 m)  Wt 227 lb 1.2 oz (103.001 kg)  BMI 42.93 kg/m2  ECOG Performance Status: 1 - Symptomatic but completely ambulatory   General appearance: alert, cooperative and appears stated age HEENT: ATNC Extremities: right groin incision is well healed. There may be a small 2 cm lymphocyst.  Neurological exam reveals alert, oriented, normal speech, no focal findings or movement disorder noted.  Pelvic: Vulva: normal appearing vulva other than the healing right incision. There is no erythema, infection, tenderness. There are no masses, tenderness or lesions. There is no evidence of vaginal stenosis.   Lab Review None  Assessment:  Holly Smith with a history of stage IB, grade 1 vulvar cancer with negative SLN and no residual disease on re-exicision.  No evidence of disease.  Medical  co-morbidities complicating care: Atrial fibrillation, CHF, obesity, HTN, insulin resistance.   Plan:   Problem List Items Addressed This Visit      Genitourinary   Vulvar cancer - Primary        Reassurance given. We reviewed risk of recurrence (up to 7-8% with negative SLN; prognosis excellent with approximately 80-95%), reviewed vulvar dystrophy, lifestyle issues such as obesity, exercise, nutrition. She is a former smoker and we congratulated her on quitting.   Follow up 3-6 months for 2 years then 6-12 months for the next 3-5 years then annually. We will begin alternating with Dr. Ouida Sills. She will follow up with Dr. Fransisca Connors in 3 months and we can probably extend her interval visits soon given the early stage disease. The patient understands and agrees with this plan.     Gillis Ends, MD  CC:   Dr. Laverta Baltimore.

## 2014-09-12 ENCOUNTER — Encounter: Payer: Self-pay | Admitting: *Deleted

## 2014-09-16 ENCOUNTER — Ambulatory Visit (INDEPENDENT_AMBULATORY_CARE_PROVIDER_SITE_OTHER): Payer: Medicare Other | Admitting: Interventional Cardiology

## 2014-09-16 ENCOUNTER — Encounter: Payer: Self-pay | Admitting: Interventional Cardiology

## 2014-09-16 VITALS — BP 118/82 | HR 81 | Ht 61.0 in | Wt 230.4 lb

## 2014-09-16 DIAGNOSIS — Z9229 Personal history of other drug therapy: Secondary | ICD-10-CM | POA: Diagnosis not present

## 2014-09-16 DIAGNOSIS — I481 Persistent atrial fibrillation: Secondary | ICD-10-CM

## 2014-09-16 DIAGNOSIS — R0602 Shortness of breath: Secondary | ICD-10-CM

## 2014-09-16 DIAGNOSIS — I10 Essential (primary) hypertension: Secondary | ICD-10-CM | POA: Diagnosis not present

## 2014-09-16 DIAGNOSIS — I4819 Other persistent atrial fibrillation: Secondary | ICD-10-CM

## 2014-09-16 LAB — COMPREHENSIVE METABOLIC PANEL
ALT: 13 U/L (ref 0–35)
AST: 15 U/L (ref 0–37)
Albumin: 3.7 g/dL (ref 3.5–5.2)
Alkaline Phosphatase: 48 U/L (ref 39–117)
BUN: 15 mg/dL (ref 6–23)
CALCIUM: 9.6 mg/dL (ref 8.4–10.5)
CO2: 29 meq/L (ref 19–32)
Chloride: 103 mEq/L (ref 96–112)
Creatinine, Ser: 1.04 mg/dL (ref 0.40–1.20)
GFR: 55.55 mL/min — ABNORMAL LOW (ref >60.00)
GLUCOSE: 113 mg/dL — AB (ref 70–99)
Potassium: 4.3 mEq/L (ref 3.5–5.1)
Sodium: 136 mEq/L (ref 135–145)
Total Bilirubin: 0.4 mg/dL (ref 0.2–1.2)
Total Protein: 7.4 g/dL (ref 6.0–8.3)

## 2014-09-16 LAB — BRAIN NATRIURETIC PEPTIDE: Pro B Natriuretic peptide (BNP): 119 pg/mL — ABNORMAL HIGH (ref 0.0–100.0)

## 2014-09-16 MED ORDER — LISINOPRIL 10 MG PO TABS: 10.0000 mg | ORAL_TABLET | Freq: Every day | ORAL | Status: DC

## 2014-09-16 NOTE — Progress Notes (Signed)
Patient ID: Holly Smith, female   DOB: September 12, 1945, 69 y.o.   MRN: [id number]     Cardiology Office Note   Date:  09/16/2014   ID:  Adriann, Thau [birth year], MRN [id number]  PCP:  Otilio Miu, MD    No chief complaint on file.  Atrial fibrillation, shortness of breath  Wt Readings from Last 3 Encounters:  09/16/14 230 lb 6.4 oz (104.509 kg)  08/07/14 227 lb 1.2 oz (103.001 kg)       History of Present Illness: Hinley Serai Tukes is a 69 y.o. female  Who had AFib for several years.  SHe had a  Cardioversion.  After the cardioversion, she was apparently slow to wake up. She then had significant shortness of breath and was found to be in acute on chronic diastolic heart failure. She was hospitalized and diuresed.  Ablation was considered. She declined. Diuretics were prescribed for medical management. She does not like taking her diuretics because she feels weaker on them. She has gained weight since stopping smoking. She is gained about 30 pounds she states in the past couple of years.  She prefers to come here for her cardiac care. She has not had an echocardiogram in more than a year. She is not feeling palpitations. She is taking her Zaroxolyn regularly for stroke prevention. She denies any bleeding problems.    Past Medical History  Diagnosis Date  . Vulvar cancer     invasive verrucous carcinoma of the vulva. History of genital warts and vulvar condyloma  . HPV (human papilloma virus) anogenital infection     03/21/12 PAP + HR HPV  . Abnormal Pap smear of vagina and vaginal HPV   . Hypertension   . COPD (chronic obstructive pulmonary disease)   . Obesity   . Chronic anticoagulation     She is on Xarelto for Afib  . Atrial fibrillation   . Echocardiogram abnormal     Mitral regurgitation, tricuspic regurgitation  . Sleep apnea   . Urolithiasis   . Postoperative nausea and vomiting   . CHF (congestive heart failure)   . Insulin resistance   .  Hyperlipidemia     Past Surgical History  Procedure Laterality Date  . Vulvectomy Right 05/10/2014    Excisional biopsy of the superior right labial majora mass  . Abdominal hysterectomy    . Bilateral salpingoophorectomy  2003    benign ovarian cancer  . Lithotripsy    . Vulvectomy partial  07/02/2014    Re-excision and sentinel node dissection at [facility].      Current Outpatient Prescriptions  Medication Sig Dispense Refill  . acetaminophen (TYLENOL) 500 MG tablet Take 500 mg by mouth every 6 (six) hours as needed for mild pain.    . clobetasol cream (TEMOVATE) 9.50 % Apply 1 application topically 2 (two) times a week.    . furosemide (LASIX) 40 MG tablet Take 1 tablet by mouth. Take 1 tablet three times a week    . metoprolol tartrate (LOPRESSOR) 25 MG tablet Take 1 tablet by mouth 2 (two) times daily.     Marland Kitchen omeprazole (PRILOSEC) 20 MG capsule Take 20 mg by mouth daily as needed. For GERD    . rivaroxaban (XARELTO) 20 MG TABS tablet Take 20 mg by mouth daily.    . solifenacin (VESICARE) 5 MG tablet Take 1 tablet by mouth daily.    Marland Kitchen lisinopril (PRINIVIL,ZESTRIL) 10 MG tablet Take 1 tablet (10 mg total) by mouth daily. Elberta  tablet 3   No current facility-administered medications for this visit.    Allergies:   Review of patient's allergies indicates no known allergies.    Social History:  The patient  reports that she has quit smoking. Her smoking use included Cigarettes and E-cigarettes. She has a 50 pack-year smoking history. She has never used smokeless tobacco. She reports that she does not drink alcohol or use illicit drugs.   Family History:  The patient's family history includes Diabetes in her mother; Healthy in her sister; Heart attack in her brother and father; Hypertension in her mother; Prostate cancer (age of onset: 13) in her brother; Stroke in her mother.    ROS:  Please see the history of present illness.   Otherwise, review of systems are positive for dyspnea on  exertion, weight gain.   All other systems are reviewed and negative.    PHYSICAL EXAM: VS:  BP 118/82 mmHg  Pulse 81  Ht $R'5\' 1"'Wv$  (1.549 m)  Wt 230 lb 6.4 oz (104.509 kg)  BMI 43.56 kg/m2 , BMI Body mass index is 43.56 kg/(m^2). GEN: Well nourished, well developed, in no acute distress HEENT: normal Neck: no JVD, carotid bruits, or masses Cardiac: irregularly irregular; no murmurs, rubs, or gallops,no edema  Respiratory:  clear to auscultation bilaterally, normal work of breathing GI: soft, nontender, nondistended, + BS, obese MS: no deformity or atrophy Skin: warm and dry, no rash Neuro:  Strength and sensation are intact Psych: euthymic mood, full affect   EKG:   The ekg ordered today demonstrates atrial fibrillation, rate controlled, nonspecific ST segment changes   Recent Labs: No results found for requested labs within last 365 days.   Lipid Panel No results found for: CHOL, TRIG, HDL, CHOLHDL, VLDL, LDLCALC, LDLDIRECT   Other studies Reviewed: Additional studies/ records that were reviewed today with results demonstrating: records from [facility] regarding cardioversion and amiodarone use..   ASSESSMENT AND PLAN:  1. Dyspnea on exertion: This is likely multifactorial. Obesity as well as chronic diastolic heart failure are playing a part. The diastolic heart failure is worsened by her atrial fibrillation even though she is rate controlled. Will check BNP and restart her furosemide at 40 mg 3 times a week. Will change lisinopril/ HCTZ to lisinopril 10 mg daily. Will use the furosemide as her diuretics. 2. Atrial fibrillation: Rate controlled. Anticoagulated for stroke prevention. After her fluid balance is adjusted, will then pursue again a strategy of rhythm control if her symptoms are not improved.  Previously on amiodarone. No longer taking this medicine.  May need electrophysiology evaluation after her diastolic heart failure is better controlled. 3. Obesity: We spoke about the  importance of weight loss. She has had trouble with weight gain since stopping smoking. 4. Hypertension: Continue current medications.blood pressure well controlled today. 5. Mitral regurgitation: Check echocardiogram to reassess if this could be playing a part in her fluid overload and shortness of breath.   Current medicines are reviewed at length with the patient today.  The patient concerns regarding her medicines were addressed.  The following changes have been made:    Labs/ tests ordered today include:   Orders Placed This Encounter  Procedures  . B Nat Peptide  . Comp Met (CMET)  . EKG 12-Lead  . Echocardiogram    Recommend 150 minutes/week of aerobic exercise Low fat, low carb, high fiber diet recommended  Disposition:   FU in 4 weeks   Signed, Jettie Booze., MD  09/16/2014 1:34 PM  [facility] [address], [place], [place]  [address] Phone: [phone]; Fax: [phone]

## 2014-09-16 NOTE — Patient Instructions (Signed)
Medication Instructions:  Stop taking Lisinopril/HCTZ and start taking Lisinopril 10 mg daily Increase Furosemide to 1 tablet three times a week.  Labwork: BNP and CMET today  Testing/Procedures: Your physician has requested that you have an echocardiogram. Echocardiography is a painless test that uses sound waves to create images of your heart. It provides your doctor with information about the size and shape of your heart and how well your heart's chambers and valves are working. This procedure takes approximately one hour. There are no restrictions for this procedure.   Follow-Up: Your physician recommends that you schedule a follow-up appointment in: 4 weeks

## 2014-09-25 ENCOUNTER — Ambulatory Visit (HOSPITAL_COMMUNITY): Payer: Medicare Other | Attending: Interventional Cardiology

## 2014-09-25 ENCOUNTER — Other Ambulatory Visit: Payer: Self-pay

## 2014-09-25 DIAGNOSIS — R0602 Shortness of breath: Secondary | ICD-10-CM | POA: Diagnosis not present

## 2014-09-25 DIAGNOSIS — I059 Rheumatic mitral valve disease, unspecified: Secondary | ICD-10-CM | POA: Insufficient documentation

## 2014-09-25 DIAGNOSIS — I340 Nonrheumatic mitral (valve) insufficiency: Secondary | ICD-10-CM | POA: Insufficient documentation

## 2014-09-25 DIAGNOSIS — I517 Cardiomegaly: Secondary | ICD-10-CM | POA: Insufficient documentation

## 2014-09-27 ENCOUNTER — Telehealth: Payer: Self-pay | Admitting: *Deleted

## 2014-09-27 DIAGNOSIS — R0602 Shortness of breath: Secondary | ICD-10-CM

## 2014-09-27 NOTE — Telephone Encounter (Signed)
Left message to call back  

## 2014-09-27 NOTE — Telephone Encounter (Signed)
Daughter in law returned call. Informed her that referral has been placed for her to see Dr. Elsworth Soho, Dr. Lamonte Sakai and Dr. Chase Caller. Daughter in law verbalized understanding and was in agreement with this plan.

## 2014-09-27 NOTE — Telephone Encounter (Signed)
-----   Message from Jettie Booze, MD sent at 09/26/2014  1:58 PM EDT ----- Normal LV function, normal valvular function.

## 2014-09-27 NOTE — Telephone Encounter (Signed)
DPR on file, informed daughter in law, Karena Addison of pt's echo results. While on the phone daughter in law asked about what to do going forward in regards to pt's [facility]. Daughter in law states that pt has COPD and she feels like that may be the cause but pt doesn't have a pulmonologist. Informed daughter that I would route this information to Dr. Irish Lack and see if he would possibly refer pt to pulmonology. Dee verbalized understanding and was in agreement with this plan.

## 2014-09-27 NOTE — Telephone Encounter (Signed)
OK to refer to Dr. Elsworth Soho, Dr. Lamonte Sakai, Dr. Chase Caller

## 2014-10-07 ENCOUNTER — Ambulatory Visit (INDEPENDENT_AMBULATORY_CARE_PROVIDER_SITE_OTHER)
Admission: RE | Admit: 2014-10-07 | Discharge: 2014-10-07 | Disposition: A | Discharge status: Home / Self Care | Payer: Medicare Other | Source: Ambulatory Visit | Attending: Internal Medicine | Admitting: Internal Medicine

## 2014-10-07 ENCOUNTER — Ambulatory Visit (INDEPENDENT_AMBULATORY_CARE_PROVIDER_SITE_OTHER): Payer: Medicare Other | Admitting: Internal Medicine

## 2014-10-07 ENCOUNTER — Encounter: Payer: Self-pay | Admitting: Internal Medicine

## 2014-10-07 VITALS — BP 112/70 | HR 121 | Ht 61.0 in | Wt 232.0 lb

## 2014-10-07 DIAGNOSIS — I481 Persistent atrial fibrillation: Secondary | ICD-10-CM | POA: Diagnosis not present

## 2014-10-07 DIAGNOSIS — I10 Essential (primary) hypertension: Secondary | ICD-10-CM | POA: Diagnosis not present

## 2014-10-07 DIAGNOSIS — I4819 Other persistent atrial fibrillation: Secondary | ICD-10-CM

## 2014-10-07 DIAGNOSIS — R0600 Dyspnea, unspecified: Secondary | ICD-10-CM

## 2014-10-07 DIAGNOSIS — R0602 Shortness of breath: Secondary | ICD-10-CM | POA: Diagnosis not present

## 2014-10-07 DIAGNOSIS — E669 Obesity, unspecified: Secondary | ICD-10-CM

## 2014-10-07 DIAGNOSIS — J449 Chronic obstructive pulmonary disease, unspecified: Secondary | ICD-10-CM | POA: Diagnosis not present

## 2014-10-07 HISTORY — DX: Essential (primary) hypertension: I10

## 2014-10-07 MED ORDER — IRBESARTAN 75 MG PO TABS: 75.0000 mg | ORAL_TABLET | Freq: Every day | ORAL | Status: DC

## 2014-10-07 NOTE — Assessment & Plan Note (Signed)
ACE inhibitors are problematic in  pts with airway complaints because  even experienced pulmonologists can't always distinguish ace effects from copd/asthma/pnds/ allergies etc.  By themselves they don't actually cause a problem, much like oxygen can't by itself start a fire, but they certainly serve as a powerful catalyst or enhancer for any "fire"  or inflammatory process in the upper airway, be it caused by an ET  tube or more commonly reflux (especially in the obese or pts with known GERD or who are on biphoshonates) or URI's, due to interference with bradykinin clearance.  The effects of acei on bradykinin levels occurs in 100% of pt's on acei (unless they surreptitiously stop the med!) but the classic cough is only reported in 5%.  This leaves 95% of pts on acei's  with a variety of syndromes including no identifiable symptom in most  vs non-specific symptoms that wax and wane depending on what other insult is occuring at the level of the upper airway, which would be likely secondary to gerd here.  rec trial of ibesartan 75 mg daily x 6 weeks, it's the only way to sort out whether acei contributing to any of her symptoms

## 2014-10-07 NOTE — Assessment & Plan Note (Signed)
Has been on amiodarone previously so needs pfts and perhaps hrct   For now rec increase lopressor until rate in the 60-90 range >  See instructions for specific recommendations which were reviewed directly with the patient who was given a copy with highlighter outlining the key components.

## 2014-10-07 NOTE — Assessment & Plan Note (Signed)
Body mass index is 43.86 kg/(m^2).  No results found for: TSH   Contributing to gerd tendency/ doe/ needs to achieve and maintain neg calorie balance > f/u primary care   ? Updated TSH?

## 2014-10-07 NOTE — Progress Notes (Signed)
Subjective:    Patient ID: Holly Smith, female    DOB: 07/25/1945,    MRN: [id number]  HPI  29 yowf quit smoking march 2014 at wt 185  due to bad cough which did get resolve but has persistent sense of head/ throat / chest "congestion" and sob so referred to pulmonary clinic 10/07/2014 by Dr Irish Lack   10/07/2014 1st [facility] office visit/ Wert   Chief Complaint  Patient presents with  . Pulmonary Consult    Referred by Dr. Irish Lack. Pt c/o SOB for the past 2 yrs- progressively getting worse. She states that she is SOB with or without any exertion.  She gets SOB with walking from room to room at home and with taking a shower. She also c/o occ heaviness in chest.    Indolent onset progressive x 2 years Sob some worse with talking or walking / can't talk and walk at all/ assoc with vague sense of chest congestion but no cough   uses cpap at home x one year per Dr Thornton Dales / confused with paperwork/ instructions but sleeping well on it  Assoc with overt hb despite ppi   No obvious ay to day or daytime variabilty or assoc   cp or chest tightness, subjective wheeze overt sinus or hb symptoms. No unusual exp hx or h/o childhood pna/ asthma or knowledge of premature birth.  Sleeping ok without nocturnal  or early am exacerbation  of respiratory  c/o's or need for noct saba. Also denies any obvious fluctuation of symptoms with weather or environmental changes or other aggravating or alleviating factors except as outlined above   Current Medications, Allergies, Complete Past Medical History, Past Surgical History, Family History, and Social History were reviewed in [employer] record.               Review of Systems  Constitutional: Negative for fever, chills and unexpected weight change.  HENT: Positive for congestion. Negative for dental problem, ear pain, nosebleeds, postnasal drip, rhinorrhea, sinus pressure, sneezing, sore throat, trouble swallowing  and voice change.   Eyes: Negative for visual disturbance.  Respiratory: Positive for shortness of breath. Negative for cough and choking.   Cardiovascular: Positive for leg swelling. Negative for chest pain.  Gastrointestinal: Negative for vomiting, abdominal pain and diarrhea.  Genitourinary: Negative for difficulty urinating.       Indigestion Acid heartburn  Musculoskeletal: Negative for arthralgias.  Skin: Negative for rash.  Neurological: Negative for tremors, syncope and headaches.  Hematological: Does not bruise/bleed easily.       Objective:   Physical Exam  amb obese wf nad   Wt Readings from Last 3 Encounters:  10/07/14 232 lb (105.235 kg)  09/16/14 230 lb 6.4 oz (104.509 kg)  08/07/14 227 lb 1.2 oz (103.001 kg)    Vital signs reviewed   HEENT: nl dentition, turbinates, and orophanx. Nl external ear canals without cough reflex   NECK :  without JVD/Nodes/TM/ nl carotid upstrokes bilaterally   LUNGS: no acc muscle use, clear to A and P bilaterally without cough on insp or exp maneuvers   CV:  RRR  no s3 or murmur or increase in P2,  Trace ankle edema bilaterally   ABD:  soft and nontender with nl excursion in the supine position. No bruits or organomegaly, bowel sounds nl  MS:  warm without deformities, calf tenderness, cyanosis or clubbing  SKIN: warm and dry without lesions    NEURO:  alert, approp, no  deficits     CXR PA and Lateral:   10/07/2014 :     I personally reviewed images and agree with radiology impression as follows:     Heart size upper normal and stable and vascular pattern is normal. Mild uncoiling and minimal calcification of the aorta. Vascular pattern normal. Lungs clear. No pleural effusions.  Labs  reviewed:    Lab Results  Component Value Date   PROBNP 119.0* 09/16/2014        Assessment & Plan:

## 2014-10-07 NOTE — Assessment & Plan Note (Addendum)
-   spirometry 10/07/2014 restrictive changes only - 10/07/2014   Walked RA x one lap @ 185 stopped due to  82% though HR erratic signal and pt walked nl pace s sob   Symptoms are markedly disproportionate to objective findings and not clear this is a lung problem but pt does appear to have difficult airway management issues. DDX of  difficult airways management all start with A and  include Adherence, Ace Inhibitors, Acid Reflux, Active Sinus Disease, Alpha 1 Antitripsin deficiency, Anxiety masquerading as Airways dz,  ABPA,  allergy(esp in young), Aspiration (esp in elderly), Adverse effects of meds,  Active smokers, A bunch of PE's (a small clot burden can't cause this syndrome unless there is already severe underlying pulm or vascular dz with poor reserve) plus two Bs  = Bronchiectasis and Beta blocker use..and one C= CHF  Adherence is always the initial "prime suspect" and is a multilayered concern that requires a "trust but verify" approach in every patient - starting with knowing how to use medications, especially inhalers, correctly, keeping up with refills and understanding the fundamental difference between maintenance and prns vs those medications only taken for a very short course and then stopped and not refilled.  - easily confused with details of care / multiple opinions re sob but " no one has explained it yet"  ACEi > note vague sense of throat and chest congestion quite suggestive > see hbp  ? Acid (or non-acid) GERD > always difficult to exclude as up to 75% of pts in some series report no assoc GI/ Heartburn symptoms> rec max (24h)  acid suppression and diet restrictions/ reviewed and instructions given in writing.   ? Anxiety related to wt gain / deconditioning > the obesity could explain the desats with ex if has atx in bases causing R to L shunt   ? A bunch of PE very unlikely on xarelto   CHF very unlikley with bnp so low though note  AFib not well controlled    I had an  extended discussion with the patient reviewing all relevant studies completed to date including lack of resp to rx prior to ov     Each maintenance medication was reviewed in detail including most importantly the difference between maintenance and prns and under what circumstances the prns are to be triggered using an action plan format that is not reflected in the computer generated alphabetically organized AVS.    Please see instructions for details which were reviewed in writing and the patient given a copy highlighting the part that I personally wrote and discussed at today's ov.   Total time = 46m review case with pt/ discussion/ counseling/ giving and going over instructions (see avs)

## 2014-10-07 NOTE — Patient Instructions (Addendum)
Make sure you take your prilosec Take 30-60 min before first meal of the day   GERD (REFLUX)  is an extremely common cause of respiratory symptoms just like yours , many times with no obvious heartburn at all.    It can be treated with medication, but also with lifestyle changes including elevation of the head of your bed (ideally with 6 inch  bed blocks),  Smoking cessation, avoidance of late meals, excessive alcohol, and avoid fatty foods, chocolate, peppermint, colas, red wine, and acidic juices such as orange juice.  NO MINT OR MENTHOL PRODUCTS SO NO COUGH DROPS  USE SUGARLESS CANDY INSTEAD (Jolley ranchers or Stover's or Life Savers) or even ice chips will also do - the key is to swallow to prevent all throat clearing. NO OIL BASED VITAMINS - use powdered substitutes.   Change lopressor to 25 mg three times daily with meals but if pulse is over 90 at rest then take four times a day  Stop lisinopril   Ibesartan 75  mg one daily  For now (break in half if light headed standing)  - this is only way to prove whether or not some of your symptoms aren't related to ACEi   Please see patient coordinator before you leave today  to obtain last sleep study and new cpap from our DME company pending appt with one of our sleep docs  Return to see me between now and appt with the sleep doc if not satisfied your breathing is getting better but it may take up to 6 weeks

## 2014-10-08 ENCOUNTER — Encounter: Payer: Self-pay | Admitting: Internal Medicine

## 2014-10-24 ENCOUNTER — Ambulatory Visit: Payer: Medicare Other | Admitting: Internal Medicine

## 2014-10-24 ENCOUNTER — Encounter: Payer: Self-pay | Admitting: Physician Assistant

## 2014-10-24 ENCOUNTER — Ambulatory Visit (INDEPENDENT_AMBULATORY_CARE_PROVIDER_SITE_OTHER): Payer: Medicare Other | Admitting: Physician Assistant

## 2014-10-24 VITALS — BP 140/80 | HR 99 | Ht 61.0 in | Wt 232.0 lb

## 2014-10-24 DIAGNOSIS — I340 Nonrheumatic mitral (valve) insufficiency: Secondary | ICD-10-CM

## 2014-10-24 DIAGNOSIS — I5032 Chronic diastolic (congestive) heart failure: Secondary | ICD-10-CM | POA: Diagnosis not present

## 2014-10-24 DIAGNOSIS — R079 Chest pain, unspecified: Secondary | ICD-10-CM | POA: Diagnosis not present

## 2014-10-24 DIAGNOSIS — I10 Essential (primary) hypertension: Secondary | ICD-10-CM

## 2014-10-24 DIAGNOSIS — I482 Chronic atrial fibrillation, unspecified: Secondary | ICD-10-CM

## 2014-10-24 DIAGNOSIS — E669 Obesity, unspecified: Secondary | ICD-10-CM

## 2014-10-24 MED ORDER — FUROSEMIDE 20 MG PO TABS
20.0000 mg | ORAL_TABLET | ORAL | Status: DC
Start: 2014-10-24 — End: 2014-11-18

## 2014-10-24 MED ORDER — METOPROLOL TARTRATE 50 MG PO TABS: 50.0000 mg | ORAL_TABLET | Freq: Two times a day (BID) | ORAL | Status: DC

## 2014-10-24 NOTE — Progress Notes (Signed)
Cardiology Office Note   Date:  10/24/2014   ID:  Holly Smith [birth year], MRN [id number]  PCP:  Holly Miu, MD  Cardiologist:  Dr. Casandra Smith   Electrophysiologist:  n/a  Chief Complaint  Patient presents with  . Follow-up    AFib, SOB     History of Present Illness: Holly Smith is a 69 y.o. female with a hx of probable tachycardia mediated cardiomyopathy, systolic HF and persistent/chronic AFib s/p prior DCCV, HTN, HL, OSA.  She was slow to wake up after DCCV in the past and developed acute HF.  She has been refractory to Propafenone and Amiodarone.  She has seen Holly Smith at [facility] in the past.  She declined PVI ablation in the past.  EF has been as low as 35% in the past.  She established with Dr. Casandra Smith 09/16/14 and noted symptoms of DOE.  He placed her on low dose Lasix 3 x a week.  It was felt that if her symptoms do not improve with diuresis, she will need pursue a strategy of rhythm control for her AFib. FU echo demonstrated normal LVF with mild MR.    She returns for FU.  She is here today with her son. I spent greater than 30 minutes reviewing her chart which is available through [facility]. The patient's atrial fibrillation history dates back to February 2014. She notes issues with shortness of breath around this time. She went to the hospital for preoperative evaluation for a GYN surgery. ECG demonstrated atrial fibrillation at that time. She was in the hospital at [facility] in 06/2013 and underwent cardioversion with restoration of normal sinus rhythm. Her EF was 35% at that time. She developed acute systolic heart failure in the hospital and required IV diuresis. She was then placed on IV amiodarone due to rapid rate and converted to normal sinus rhythm. She remained on amiodarone for at least 2-3 months after this. Follow-up notes indicate she was back in atrial fibrillation by June 2015. I suspect her amiodarone was stopped because of  recurrent atrial fibrillation. The patient does not recall ever being on Propafenone. Of note, her breathing was much better when she was on Amiodarone. She tells me that her breathing now is short with any type of activity. She describes NYHA 3 symptoms. She sleeps on 2 pillows chronically. She sleeps with CPAP nightly. She has dependent pedal edema without significant change. She has occasional sharp chest pains. These last a few seconds. She denies exertional symptoms. She does have a cough that is nonproductive. She notes occasional wheezing. She saw Dr. Melvyn Smith recently for pulmonology evaluation. He changed her ACE inhibitor to ARB. He also placed on PPI.      Studies/Reports Reviewed Today:  Echo 09/25/14 EF 55% to 60%, MAC, mild MR  Chest x-ray 10/07/14 No active disease  PFTs 09/2014 FEV1 69% predicted FEV1/FVC 98% predicted Moderate restriction   Past Medical History  Diagnosis Date  . Vulvar cancer     invasive verrucous carcinoma of the vulva. History of genital warts and vulvar condyloma  . HPV (human papilloma virus) anogenital infection     03/21/12 PAP + HR HPV  . Abnormal Pap smear of vagina and vaginal HPV   . Hypertension   . COPD (chronic obstructive pulmonary disease)   . Obesity   . Chronic anticoagulation     She is on Xarelto for Afib  . Atrial fibrillation   . Echocardiogram abnormal  Mitral regurgitation, tricuspic regurgitation  . Sleep apnea   . Urolithiasis   . Postoperative nausea and vomiting   . CHF (congestive heart failure)   . Insulin resistance   . Hyperlipidemia     Past Surgical History  Procedure Laterality Date  . Vulvectomy Right 05/10/2014    Excisional biopsy of the superior right labial majora mass  . Abdominal hysterectomy    . Bilateral salpingoophorectomy  2003    benign ovarian cancer  . Lithotripsy    . Vulvectomy partial  07/02/2014    Re-excision and sentinel node dissection at [facility].      Current Outpatient  Prescriptions  Medication Sig Dispense Refill  . acetaminophen (TYLENOL) 500 MG tablet Take 500 mg by mouth every 6 (six) hours as needed for mild pain.    . furosemide (LASIX) 20 MG tablet Take 1 tablet (20 mg total) by mouth every other day. Take 1 tablet three times a week 30 tablet 11  . irbesartan (AVAPRO) 75 MG tablet Take 1 tablet (75 mg total) by mouth daily. 30 tablet 11  . metoprolol tartrate (LOPRESSOR) 50 MG tablet Take 1 tablet (50 mg total) by mouth 2 (two) times daily. 60 tablet 11  . omeprazole (PRILOSEC) 20 MG capsule Take 20 mg by mouth daily as needed. For GERD    . rivaroxaban (XARELTO) 20 MG TABS tablet Take 20 mg by mouth daily.    . solifenacin (VESICARE) 5 MG tablet Take 1 tablet by mouth daily.     No current facility-administered medications for this visit.    Allergies:   Review of patient's allergies indicates no known allergies.    Social History:  The patient  reports that she quit smoking about 2 years ago. Her smoking use included Cigarettes and E-cigarettes. She has a 50 pack-year smoking history. She has never used smokeless tobacco. She reports that she does not drink alcohol or use illicit drugs.   Family History:  The patient's family history includes Diabetes in her mother; Healthy in her sister; Heart attack in her brother and father; Hypertension in her mother; Prostate cancer (age of onset: 4) in her brother; Stroke in her mother.    ROS:   Please see the history of present illness.   Review of Systems  Constitution: Positive for diaphoresis.  HENT: Positive for headaches.   Cardiovascular: Positive for chest pain, dyspnea on exertion, irregular heartbeat, leg swelling and orthopnea.  Respiratory: Positive for snoring and wheezing.   Hematologic/Lymphatic: Bruises/bleeds easily.  Musculoskeletal: Positive for back pain, joint pain and myalgias.  Gastrointestinal: Positive for abdominal pain, nausea and vomiting.  Neurological: Positive for  dizziness.  Psychiatric/Behavioral: Positive for depression. The patient is nervous/anxious.   All other systems reviewed and are negative.     PHYSICAL EXAM: VS:  BP 140/80 mmHg  Pulse 99  Ht 5\' 1"  (1.549 m)  Wt 232 lb (105.235 kg)  BMI 43.86 kg/m2  SpO2 96%    Wt Readings from Last 3 Encounters:  10/24/14 232 lb (105.235 kg)  10/07/14 232 lb (105.235 kg)  09/16/14 230 lb 6.4 oz (104.509 kg)     GEN: Well nourished, well developed, in no acute distress HEENT: normal Neck: no JVD,  no masses Cardiac:  Normal S1/S2, irreg irreg rhythm; no murmur ,  no rubs or gallops, no edema   Respiratory:  Decreased breath sounds bilaterally, no wheezing, rhonchi or rales. GI: soft, nontender, nondistended, + BS MS: no deformity or atrophy Skin: warm and  dry  Neuro:  CNs II-XII intact, Strength and sensation are intact Psych: Normal affect   EKG:  EKG is ordered today.  It demonstrates:   Atrial fibrillation, HR 99, normal axis   Recent Labs: 09/16/2014: ALT 13; BUN 15; Creatinine, Ser 1.04; Potassium 4.3; Pro B Natriuretic peptide (BNP) 119.0*; Sodium 136    Lipid Panel No results found for: CHOL, TRIG, HDL, CHOLHDL, VLDL, LDLCALC, LDLDIRECT    ASSESSMENT AND PLAN:  Chronic atrial fibrillation:  The patient has had issues with shortness of breath for the past 2 years. Her breathing improved while on amiodarone. It appears that she was at least in normal sinus rhythm for a couple of months. I suspect her shortness of breath is multifactorial and related to obesity, HF, deconditioning and atrial fibrillation. I suspect her atrial fibrillation contributes to worsening shortness of breath. She clearly felt better in sinus rhythm. I believe she needs a rhythm control strategy. As best I can tell, she failed amiodarone therapy. Notes indicate that she may have failed Propafenone therapy as well.  She does not have any known history of ischemic heart disease. However, she does complain of some  atypical chest pains. I am not certain if she could be considered for flecainide. Tikosyn may be the best option to try to restore normal sinus rhythm. She did see a physician at [facility] in 2015 to discuss PVI ablation. She apparently declined this at that time. I believe that she needs to be seen by Dr. Rayann Heman to further evaluate her atrial fibrillation and make a disposition for rhythm control.  CHADS2-VASc=3.  She requires long-term anticoagulation. She has been tolerating Xarelto. Rate control is borderline at best.   -  Continue Xarelto.  -  Increase Metoprolol Tartrate to 50 mg Twice daily   -  Arrange FU BMET, CBC  -  Arrange Lexiscan Myoview to rule out ischemia  -  Refer to Dr. Thompson Grayer for evaluation and management AFib.  Her prior PCP (Dr. Rex Kras) recommended him to her.  Chronic diastolic CHF (congestive heart failure):  Her breathing is better when she takes Lasix.  I suspect she has some diastolic HF driven by AFib.  She felt bad on Lasix 40 mg 3 x per week.    -  Decrease Lasix to 20 mg every other day.  -  BMET 2 weeks.   Essential hypertension:  Borderline control.  Adjust metoprolol as noted.  Mitral regurgitation:  Mild by recent echo.  Obesity:  This is likely contributing to dyspnea as well.   Sleep Apnea:  Continue CPAP.    Medication Changes: Current medicines are reviewed at length with the patient today.  Concerns regarding medicines are as outlined above.  The following changes have been made:   Discontinued Medications   CLOBETASOL CREAM (TEMOVATE) 0.05 %    Apply 1 application topically 2 (two) times a week.   Modified Medications   Modified Medication Previous Medication   FUROSEMIDE (LASIX) 20 MG TABLET furosemide (LASIX) 40 MG tablet      Take 1 tablet (20 mg total) by mouth every other day. Take 1 tablet three times a week    Take 1 tablet by mouth. Take 1 tablet three times a week   METOPROLOL TARTRATE (LOPRESSOR) 50 MG TABLET metoprolol tartrate  (LOPRESSOR) 25 MG tablet      Take 1 tablet (50 mg total) by mouth 2 (two) times daily.    Take 1 tablet by mouth 3 (three) times  daily.    New Prescriptions   No medications on file    Labs/ tests ordered today include:   Orders Placed This Encounter  Procedures  . CBC w/Diff  . Myocardial Perfusion Imaging  . EKG 12-Lead     Disposition:   I spent > 40 minutes reviewing her records and evaluating the patient today.  Face to Face time > 50%.  Refer to Dr. Thompson Grayer    Signed, Richardson Dopp, PA-C, MHS 10/24/2014 5:09 PM    [facility] [address], [place], [place]  [address] Phone: [phone]; Fax: [phone]

## 2014-10-24 NOTE — Patient Instructions (Addendum)
Medication Instructions:  1. INCREASE METOPROLOL TO 50 MG TWICE DAILY  2. CHANGE LASIX TO 20 MG EVERY OTHER DAY; IF AFTER THIS CHANGE AND YOU STILL FEEL BAD PER SCOTT WEAVER, PAC CHANGE LASIX TO 20 MG 3 DAYS A WEEK  Labwork: 2 WEEKS BMET, CBC  Testing/Procedures: Your physician has requested that you have a lexiscan myoview. For further information please visit [email or web address]. Please follow instruction sheet, as given.   Follow-Up:  PER SCOTT WEAVER, PAC YOU NEED TO FOLLOW UP WITH DR. ALLRED IN 2-3 WEEKS  Any Other Special Instructions Will Be Listed Below (If Applicable).

## 2014-10-25 ENCOUNTER — Telehealth (HOSPITAL_COMMUNITY): Payer: Self-pay

## 2014-10-25 NOTE — Telephone Encounter (Signed)
Patient's daughter in law Merriel Zinger) per [facility] given detailed instructions per Myocardial Perfusion Study Information Sheet for test on 10-28-2014 at 1145. Patient's daughter in law Notified to arrive 15 minutes early, and that it is imperative to arrive on time for appointment to keep from having the test rescheduled. Patient's daughter in law verbalized understanding. Oletta Lamas, Jayona Mccaig A

## 2014-10-28 ENCOUNTER — Ambulatory Visit (HOSPITAL_COMMUNITY): Payer: Medicare Other | Attending: Cardiology

## 2014-10-28 DIAGNOSIS — I482 Chronic atrial fibrillation, unspecified: Secondary | ICD-10-CM

## 2014-10-28 DIAGNOSIS — R9439 Abnormal result of other cardiovascular function study: Secondary | ICD-10-CM | POA: Insufficient documentation

## 2014-10-28 MED ORDER — REGADENOSON 0.4 MG/5ML IV SOLN
0.4000 mg | Freq: Once | INTRAVENOUS | Status: AC
  Administered 2014-10-28: 0.4 mg via INTRAVENOUS

## 2014-10-28 MED ORDER — TECHNETIUM TC 99M SESTAMIBI GENERIC - CARDIOLITE
31.9000 | Freq: Once | INTRAVENOUS | Status: AC | PRN
  Administered 2014-10-28: 31.9 via INTRAVENOUS

## 2014-10-29 ENCOUNTER — Ambulatory Visit (HOSPITAL_COMMUNITY): Payer: Medicare Other | Attending: Cardiovascular Disease

## 2014-10-29 LAB — MYOCARDIAL PERFUSION IMAGING
CHL CUP NUCLEAR SDS: 3
CHL CUP NUCLEAR SRS: 4
LV dias vol: 93 mL
LV sys vol: 54 mL
Peak HR: 99 {beats}/min
RATE: 0.31
Rest HR: 78 {beats}/min
SSS: 7
TID: 1

## 2014-10-29 MED ORDER — TECHNETIUM TC 99M SESTAMIBI GENERIC - CARDIOLITE
31.5000 | Freq: Once | INTRAVENOUS | Status: AC | PRN
  Administered 2014-10-29: 32 via INTRAVENOUS

## 2014-10-30 ENCOUNTER — Encounter: Payer: Self-pay | Admitting: Physician Assistant

## 2014-11-01 ENCOUNTER — Telehealth: Payer: Self-pay | Admitting: *Deleted

## 2014-11-01 NOTE — Telephone Encounter (Signed)
Pt notified of myoview results no blockage, recent echo showed normal LV function. Pt verbalized understanding to results given today by phone.

## 2014-11-06 ENCOUNTER — Inpatient Hospital Stay: Payer: Medicare Other

## 2014-11-08 ENCOUNTER — Other Ambulatory Visit (INDEPENDENT_AMBULATORY_CARE_PROVIDER_SITE_OTHER): Payer: Medicare Other

## 2014-11-08 DIAGNOSIS — I482 Chronic atrial fibrillation, unspecified: Secondary | ICD-10-CM

## 2014-11-09 LAB — CBC WITH DIFFERENTIAL/PLATELET
BASOS ABS: 0 10*3/uL (ref 0.0–0.2)
Basos: 0 %
EOS (ABSOLUTE): 0.1 10*3/uL (ref 0.0–0.4)
Eos: 2 %
HEMOGLOBIN: 12.9 g/dL (ref 11.1–15.9)
Hematocrit: 38.9 % (ref 34.0–46.6)
IMMATURE GRANULOCYTES: 0 %
Immature Grans (Abs): 0 10*3/uL (ref 0.0–0.1)
LYMPHS ABS: 2.4 10*3/uL (ref 0.7–3.1)
Lymphs: 30 %
MCH: 30.9 pg (ref 26.6–33.0)
MCHC: 33.2 g/dL (ref 31.5–35.7)
MCV: 93 fL (ref 79–97)
MONOCYTES: 10 %
Monocytes Absolute: 0.8 10*3/uL (ref 0.1–0.9)
NEUTROS PCT: 58 %
Neutrophils Absolute: 4.7 10*3/uL (ref 1.4–7.0)
Platelets: 243 10*3/uL (ref 150–379)
RBC: 4.17 x10E6/uL (ref 3.77–5.28)
RDW: 13.4 % (ref 12.3–15.4)
WBC: 8 10*3/uL (ref 3.4–10.8)

## 2014-11-11 ENCOUNTER — Ambulatory Visit: Payer: Medicare Other | Admitting: Internal Medicine

## 2014-11-11 ENCOUNTER — Telehealth: Payer: Self-pay | Admitting: Physician Assistant

## 2014-11-11 ENCOUNTER — Telehealth: Payer: Self-pay | Admitting: *Deleted

## 2014-11-11 DIAGNOSIS — I10 Essential (primary) hypertension: Secondary | ICD-10-CM

## 2014-11-11 NOTE — Telephone Encounter (Signed)
I rtnd pt's call about lab work. I apologized that BMET was not done and if she could get bmet one day this week. If she wants ok to get in the [place] office. I asked for ptcb to let me know. Pt did cb and will get lab Wed in [place].

## 2014-11-11 NOTE — Telephone Encounter (Signed)
New message      Pt states her son told her she needed another blood test.  Pt had labs drawn last Friday at the [place] office.  Will she need to have more lab work drawn?

## 2014-11-11 NOTE — Telephone Encounter (Signed)
s/w pt's daughter in law Diona and informed that BMET was not done. I asked if pt could come in this today or one day this week. She said she will s/w pt and cb to let me know what day so that I may make appt for lab. Diona aware CBC looks good.

## 2014-11-13 ENCOUNTER — Inpatient Hospital Stay: Payer: Medicare Other | Attending: Obstetrics and Gynecology

## 2014-11-13 ENCOUNTER — Other Ambulatory Visit (INDEPENDENT_AMBULATORY_CARE_PROVIDER_SITE_OTHER): Payer: Medicare Other | Admitting: *Deleted

## 2014-11-13 DIAGNOSIS — I10 Essential (primary) hypertension: Secondary | ICD-10-CM

## 2014-11-14 ENCOUNTER — Telehealth: Payer: Self-pay | Admitting: *Deleted

## 2014-11-14 ENCOUNTER — Encounter: Payer: Self-pay | Admitting: *Deleted

## 2014-11-14 LAB — BASIC METABOLIC PANEL
BUN / CREAT RATIO: 20 (ref 11–26)
BUN: 16 mg/dL (ref 8–27)
CO2: 23 mmol/L (ref 18–29)
CREATININE: 0.82 mg/dL (ref 0.57–1.00)
Calcium: 9.1 mg/dL (ref 8.7–10.3)
Chloride: 103 mmol/L (ref 97–108)
GFR calc Af Amer: 84 mL/min/{1.73_m2} (ref >59)
GFR calc non Af Amer: 73 mL/min/{1.73_m2} (ref >59)
GLUCOSE: 103 mg/dL — AB (ref 65–99)
Potassium: 5 mmol/L (ref 3.5–5.2)
Sodium: 141 mmol/L (ref 134–144)

## 2014-11-14 NOTE — Telephone Encounter (Signed)
Pt notified lab results as well as recommendation to limit dietary K+ . Will repeat bmet at f/u. Pt verbalized understanding.

## 2014-11-14 NOTE — Progress Notes (Signed)
No show on 11/13/14. Scheduling notified to r/s no show with Dr. Theora Gianotti or [facility] with next available gyn oncology clinic apt.

## 2014-11-16 ENCOUNTER — Telehealth: Payer: Self-pay | Admitting: Physician Assistant

## 2014-11-16 DIAGNOSIS — I482 Chronic atrial fibrillation, unspecified: Secondary | ICD-10-CM

## 2014-11-16 MED ORDER — METOPROLOL TARTRATE 50 MG PO TABS: 75.0000 mg | ORAL_TABLET | Freq: Two times a day (BID) | ORAL | Status: DC

## 2014-11-16 NOTE — Telephone Encounter (Signed)
Patient's daughter-in-law called to state her blood pressures been elevated 167/99 on several occasions. She's also complaining of shortness of breath. Her weight is up from 232-236 pounds. Heart rate is 99 206 bpm. She's currently taking Lasix 3 times per week. I've asked her to take an extra dose today and will increase her metoprolol to 75 mg twice daily if she's not improved, he'll call back tomorrow.  Tarri Fuller PAC

## 2014-11-18 ENCOUNTER — Encounter: Payer: Self-pay | Admitting: Internal Medicine

## 2014-11-18 ENCOUNTER — Ambulatory Visit (INDEPENDENT_AMBULATORY_CARE_PROVIDER_SITE_OTHER): Payer: Medicare Other | Admitting: Internal Medicine

## 2014-11-18 VITALS — BP 132/84 | HR 99 | Ht 60.0 in | Wt 232.0 lb

## 2014-11-18 DIAGNOSIS — R0600 Dyspnea, unspecified: Secondary | ICD-10-CM | POA: Diagnosis not present

## 2014-11-18 DIAGNOSIS — E669 Obesity, unspecified: Secondary | ICD-10-CM | POA: Diagnosis not present

## 2014-11-18 DIAGNOSIS — I10 Essential (primary) hypertension: Secondary | ICD-10-CM

## 2014-11-18 NOTE — Progress Notes (Signed)
Subjective:    Patient ID: Holly Smith, female    DOB: 07/29/1945,    MRN: [id number]    Brief patient profile:  45 yowf quit smoking march 2014 at wt 185  due to bad cough which did get resolve but has persistent sense of head/ throat / chest "congestion" and sob so referred to pulmonary clinic 10/07/2014 by Dr Irish Lack   10/07/2014 1st [facility] office visit/ Holly Smith   Chief Complaint  Patient presents with  . Pulmonary Consult    Referred by Dr. Irish Lack. Pt c/o SOB for the past 2 yrs- progressively getting worse. She states that she is SOB with or without any exertion.  She gets SOB with walking from room to room at home and with taking a shower. She also c/o occ heaviness in chest.    Indolent onset progressive x 2 years Sob some worse with talking or walking / can't talk and walk at all/ assoc with vague sense of chest congestion but no cough   uses cpap at home x one year per Dr Thornton Dales / confused with paperwork/ instructions but sleeping well on it  Assoc with overt hb despite ppi  rec  Make sure you take your prilosec Take 30-60 min before first meal of the day  GERD  Diet   Change lopressor to 25 mg three times daily with meals but if pulse is over 90 at rest then take four times a day Stop lisinopril  Ibesartan 75  mg one daily  For now (break in half if light headed standing)  - this is only way to prove whether or not some of your symptoms aren't related to ACEi     11/18/2014 f/u ov/Holly Smith re: unexplained doe  Chief Complaint  Patient presents with  . Follow-up    Pt states breathing has been worse for the past 4-5 days and has also had swelling in her legs.     Dyspnea correlates more with swelling than with cough. Sleep eval pending/ still sob talking/ not following gerd diet but does take ppi ac qd   No obvious day to day or daytime variability or assoc chronic cough or cp or chest tightness, subjective wheeze or overt sinus or hb symptoms. No unusual exp  hx or h/o childhood pna/ asthma or knowledge of premature birth.  Sleeping ok without nocturnal  or early am exacerbation  of respiratory  c/o's or need for noct saba. Also denies any obvious fluctuation of symptoms with weather or environmental changes or other aggravating or alleviating factors except as outlined above   Current Medications, Allergies, Complete Past Medical History, Past Surgical History, Family History, and Social History were reviewed in [employer] record.  ROS  The following are not active complaints unless bolded sore throat, dysphagia, dental problems, itching, sneezing,  nasal congestion or excess/ purulent secretions, ear ache,   fever, chills, sweats, unintended wt loss, classically pleuritic or exertional cp, hemoptysis,  orthopnea pnd or leg swelling, presyncope, palpitations, abdominal pain, anorexia, nausea, vomiting, diarrhea  or change in bowel or bladder habits, change in stools or urine, dysuria,hematuria,  rash, arthralgias, visual complaints, headache, numbness, weakness or ataxia or problems with walking or coordination,  change in mood/affect or memory.            Objective:   Physical Exam  amb obese wf nad  11/18/2014        232  Wt Readings from Last 3 Encounters:  10/07/14 232  lb (105.235 kg)  09/16/14 230 lb 6.4 oz (104.509 kg)  08/07/14 227 lb 1.2 oz (103.001 kg)    Vital signs reviewed   HEENT: nl dentition, turbinates, and orophanx. Nl external ear canals without cough reflex   NECK :  without JVD/Nodes/TM/ nl carotid upstrokes bilaterally   LUNGS: no acc muscle use, clear to A and P bilaterally without cough on insp or exp maneuvers   CV:  RRR  no s3 or murmur or increase in P2,  Trace ankle edema bilaterally   ABD:  soft and nontender with nl excursion in the supine position. No bruits or organomegaly, bowel sounds nl  MS:  warm without deformities, calf tenderness, cyanosis or clubbing  SKIN: warm and  dry without lesions    NEURO:  alert, approp, no deficits     CXR PA and Lateral:   10/07/2014 :     I personally reviewed images and agree with radiology impression as follows:    Heart size upper normal and stable and vascular pattern is normal. Mild uncoiling and minimal calcification of the aorta. Vascular pattern normal. Lungs clear. No pleural effusions.       Assessment & Plan:   Outpatient Encounter Prescriptions as of 11/18/2014  Medication Sig  . acetaminophen (TYLENOL) 500 MG tablet Take 500 mg by mouth every 6 (six) hours as needed for mild pain.  . furosemide (LASIX) 20 MG tablet Take 20 mg by mouth every other day.  . irbesartan (AVAPRO) 75 MG tablet Take 1 tablet (75 mg total) by mouth daily.  . metoprolol (LOPRESSOR) 50 MG tablet Take 75 mg by mouth 2 (two) times daily.  Marland Kitchen omeprazole (PRILOSEC) 20 MG capsule Take 20 mg by mouth daily before breakfast.  . rivaroxaban (XARELTO) 20 MG TABS tablet Take 20 mg by mouth daily.  . solifenacin (VESICARE) 5 MG tablet Take 1 tablet by mouth daily.  . [DISCONTINUED] furosemide (LASIX) 20 MG tablet Take 1 tablet (20 mg total) by mouth every other day. Take 1 tablet three times a week (Patient taking differently: Take 20 mg by mouth every other day. )  . [DISCONTINUED] metoprolol (LOPRESSOR) 50 MG tablet Take 1.5 tablets (75 mg total) by mouth 2 (two) times daily. (Patient not taking: Reported on 11/18/2014)  . [DISCONTINUED] omeprazole (PRILOSEC) 20 MG capsule Take 20 mg by mouth daily as needed. For GERD   No facility-administered encounter medications on file as of 11/18/2014.

## 2014-11-18 NOTE — Patient Instructions (Addendum)
Pepcid 20 mg one at bedtime   GERD (REFLUX)  is an extremely common cause of respiratory symptoms just like yours , many times with no obvious heartburn at all.    It can be treated with medication, but also with lifestyle changes including elevation of the head of your bed (ideally with 6 inch  bed blocks),  Smoking cessation, avoidance of late meals, excessive alcohol, and avoid fatty foods, chocolate, peppermint, colas, red wine, and acidic juices such as orange juice.  NO MINT OR MENTHOL PRODUCTS SO NO COUGH DROPS  USE SUGARLESS CANDY INSTEAD (Jolley ranchers or Stover's or Life Savers) or even ice chips will also do - the key is to swallow to prevent all throat clearing. NO OIL BASED VITAMINS - use powdered substitutes.  See if we can schedule you for pfts when return to see Dr Elsworth Soho  Need to do TSH at some point if not done by  Dr Ronnald Ramp in the past year  - check with him and if not we'll do it here on return if needed

## 2014-11-19 ENCOUNTER — Encounter: Payer: Self-pay | Admitting: Internal Medicine

## 2014-11-19 NOTE — Assessment & Plan Note (Addendum)
Trial off acei 10/07/2014  Due to atypical sob (talking = walking)> no real change 11/18/2014   Adequate control on present rx, reviewed > no change in rx needed    Note: Off acei for a little over 4 weeks s change so unlikely to have acei related symptoms but until we get her sorted out I would avoid rechallenging with acei

## 2014-11-19 NOTE — Assessment & Plan Note (Signed)
Body mass index is 45.31 - no sign change   No results found for: TSH   Contributing to gerd tendency/ doe/reviewed need  achieve and maintain neg calorie balance > defer f/u primary care including intermittently monitoring thyroid status

## 2014-11-19 NOTE — Assessment & Plan Note (Addendum)
-   Echo 09/25/14 Left ventricle: The cavity size was normal. Wall thickness was normal. Systolic function was normal. The estimated ejection fraction was in the range of 55% to 60%. - Mitral valve: Calcified annulus. Mildly thickened leaflets . There was mild regurgitation. - Left atrium: The atrium was mildly dilated. - spirometry 10/07/2014 restrictive changes only - 10/07/2014   Walked RA x one lap @ 185 stopped due to  82% though HR erratic signal and pt walked nl pace s sob  - trial off ACEi 10/07/2014 > no real change as of 11/18/2014  - 11/18/2014  Walked RA x 2 laps @ 185 ft each stopped due to leg pain, no sob or desat/ nl pace   I had an extended final summary discussion with the patient reviewing all relevant studies completed to date and  lasting 15 to 20 minutes of a 25 minute visit on the following issues:    1)  I believe the previous walking sat was erroneous as the erratic signal suggested - she really is not presently limited by sob or pulmonary mechanics  2) her main problem is obesity related restrictive change but needs to be confirmed with formal lung volumes > ordered  3) f/u needed in sleep medicine > to see Dr Elsworth Soho who can also close the loop on the doe issue  4) Each maintenance medication was reviewed in detail including most importantly the difference between maintenance and as needed and under what circumstances the prns are to be used.  Please see instructions for details which were reviewed in writing and the patient given a copy.

## 2014-11-20 ENCOUNTER — Ambulatory Visit (INDEPENDENT_AMBULATORY_CARE_PROVIDER_SITE_OTHER): Payer: Medicare Other | Admitting: Internal Medicine

## 2014-11-20 DIAGNOSIS — R0600 Dyspnea, unspecified: Secondary | ICD-10-CM

## 2014-11-20 LAB — PULMONARY FUNCTION TEST
DL/VA % PRED: 123 %
DL/VA: 5.45 ml/min/mmHg/L
DLCO unc % pred: 91 %
DLCO unc: 18.57 ml/min/mmHg
FEF 25-75 POST: 1.65 L/s
FEF 25-75 Pre: 1.09 L/sec
FEF2575-%CHANGE-POST: 51 %
FEF2575-%PRED-PRE: 64 %
FEF2575-%Pred-Post: 98 %
FEV1-%Change-Post: 9 %
FEV1-%Pred-Post: 69 %
FEV1-%Pred-Pre: 64 %
FEV1-PRE: 1.25 L
FEV1-Post: 1.37 L
FEV1FVC-%CHANGE-POST: 7 %
FEV1FVC-%Pred-Pre: 103 %
FEV6-%Change-Post: 1 %
FEV6-%PRED-PRE: 64 %
FEV6-%Pred-Post: 65 %
FEV6-Post: 1.62 L
FEV6-Pre: 1.59 L
FEV6FVC-%Change-Post: 0 %
FEV6FVC-%Pred-Post: 105 %
FEV6FVC-%Pred-Pre: 104 %
FVC-%CHANGE-POST: 1 %
FVC-%PRED-POST: 62 %
FVC-%PRED-PRE: 61 %
FVC-POST: 1.62 L
FVC-PRE: 1.6 L
POST FEV6/FVC RATIO: 100 %
PRE FEV1/FVC RATIO: 78 %
Post FEV1/FVC ratio: 84 %
Pre FEV6/FVC Ratio: 100 %
RV % pred: 91 %
RV: 1.87 L
TLC % pred: 78 %
TLC: 3.62 L

## 2014-11-20 NOTE — Progress Notes (Signed)
PFT done today. 

## 2014-11-20 NOTE — Progress Notes (Signed)
Quick Note:  Spoke with pt and notified of results per Dr. Wert. Pt verbalized understanding and denied any questions.  ______ 

## 2014-11-27 ENCOUNTER — Ambulatory Visit (INDEPENDENT_AMBULATORY_CARE_PROVIDER_SITE_OTHER): Payer: Medicare Other | Admitting: Pulmonary Disease

## 2014-11-27 ENCOUNTER — Inpatient Hospital Stay: Payer: Medicare Other

## 2014-11-27 ENCOUNTER — Encounter: Payer: Self-pay | Admitting: Pulmonary Disease

## 2014-11-27 VITALS — BP 130/84 | HR 85 | Ht 61.0 in | Wt 233.0 lb

## 2014-11-27 DIAGNOSIS — G4733 Obstructive sleep apnea (adult) (pediatric): Secondary | ICD-10-CM

## 2014-11-27 DIAGNOSIS — R0600 Dyspnea, unspecified: Secondary | ICD-10-CM | POA: Diagnosis not present

## 2014-11-27 HISTORY — DX: Obstructive sleep apnea (adult) (pediatric): G47.33

## 2014-11-27 MED ORDER — ALBUTEROL SULFATE HFA 108 (90 BASE) MCG/ACT IN AERS: 2.0000 | INHALATION_SPRAY | Freq: Three times a day (TID) | RESPIRATORY_TRACT | Status: DC | PRN

## 2014-11-27 NOTE — Progress Notes (Signed)
Subjective:    Patient ID: Holly Smith, female    DOB: 07/18/1945, 69 y.o.   MRN: [id number]  HPI  69 year old obese ex-smoker with persistent dyspnea referred for evaluation of sleep-disordered breathing. Her cardiac evaluation was normal. Spirometry suggested restriction. She smoked about 50 pack years until she quit in 2014-she has gained 36 pounds in the last 2 years. She has chronic atrial fibrillation status post DC CV, maintained on Xarelto. She saw my partner for persistent dyspnea, was taken off ACE inhibitor, but episodes of dyspnea seemed to persist. She is accompanied by her son, vernon today. PSG 07/2013-197 pounds-showed RDI 17/hour, corrected by CPAP of 8 cm. She uses a nasal mask and claims compliance Epworth sleepiness score is 8 Bedtime is around 11 PM, sleep latency is minimal, she sleeps on her side with 2 pillows, reports one nocturnal awakening-denies nocturia, is out of bed by 7 AM feeling tired with occasional dryness of mouth without headaches. She takes nap in the afternoons often and feels rested on waking up. There is no history suggestive of cataplexy, sleep paralysis or parasomnias  She is not compliant with Lasix. Her son has noted her to be in significant dyspnea on multiple occasions. She wonders if this could be related to anxiety  Past Medical History  Diagnosis Date  . Vulvar cancer     invasive verrucous carcinoma of the vulva. History of genital warts and vulvar condyloma  . HPV (human papilloma virus) anogenital infection     03/21/12 PAP + HR HPV  . Abnormal Pap smear of vagina and vaginal HPV   . Hypertension   . COPD (chronic obstructive pulmonary disease)   . Obesity   . Chronic anticoagulation     She is on Xarelto for Afib  . Atrial fibrillation   . Echocardiogram abnormal     Mitral regurgitation, tricuspic regurgitation  . Sleep apnea   . Urolithiasis   . Postoperative nausea and vomiting   . CHF (congestive heart failure)   .  Insulin resistance   . Hyperlipidemia   . History of stress test     Myoview 8/16: EF 42%, anteroseptal, inferoseptal, anterior, apical anterior and apical defect (likely represents breast attenuation versus scar), no ischemia; intermediate risk because of low EF   Past Surgical History  Procedure Laterality Date  . Vulvectomy Right 05/10/2014    Excisional biopsy of the superior right labial majora mass  . Abdominal hysterectomy    . Bilateral salpingoophorectomy  2003    benign ovarian cancer  . Lithotripsy    . Vulvectomy partial  07/02/2014    Re-excision and sentinel node dissection at [facility].     No Known Allergies  Social History   Social History  . Marital Status: Married    Spouse Name: N/A  . Number of Children: N/A  . Years of Education: N/A   Occupational History  . Not on file.   Social History Main Topics  . Smoking status: Former Smoker -- 1.00 packs/day for 50 years    Types: Cigarettes, E-cigarettes    Quit date: 03/22/2012  . Smokeless tobacco: Never Used  . Alcohol Use: No  . Drug Use: No  . Sexual Activity: Not on file   Other Topics Concern  . Not on file   Social History Narrative    Family History  Problem Relation Age of Onset  . Prostate cancer Brother 75    still living and well  . Heart attack Father   .  Stroke Mother   . Diabetes Mother   . Hypertension Mother   . Heart attack Brother   . Healthy Sister     Review of Systems  Constitutional: Negative for fever, chills and unexpected weight change.  HENT: Negative for congestion, dental problem, ear pain, nosebleeds, postnasal drip, rhinorrhea, sinus pressure, sneezing, sore throat, trouble swallowing and voice change.   Eyes: Negative for visual disturbance.  Respiratory: Positive for shortness of breath. Negative for cough and choking.   Cardiovascular: Negative for chest pain and leg swelling.  Gastrointestinal: Negative for vomiting, abdominal pain and diarrhea.    Genitourinary: Negative for difficulty urinating.  Musculoskeletal: Negative for arthralgias.  Skin: Negative for rash.  Neurological: Negative for tremors, syncope and headaches.  Hematological: Does not bruise/bleed easily.       Objective:   Physical Exam  Gen. Pleasant, obese, in no distress, normal affect ENT - no lesions, no post nasal drip, class 2-3 airway Neck: No JVD, no thyromegaly, no carotid bruits Lungs: no use of accessory muscles, no dullness to percussion, decreased without rales or rhonchi  Cardiovascular: Rhythm regular, heart sounds  normal, no murmurs or gallops, no peripheral edema Abdomen: soft and non-tender, no hepatosplenomegaly, BS normal. Musculoskeletal: No deformities, no cyanosis or clubbing Neuro:  alert, non focal, no tremors        Assessment & Plan:

## 2014-11-27 NOTE — Assessment & Plan Note (Signed)
You have moderate obstructive sleep apnea  You are on CPAP 8 cm We will provide you with a chinstrap & set you up with new DME - [facility]  Weight loss encouraged, compliance with goal of at least 4-6 hrs every night is the expectation. Advised against medications with sedative side effects Cautioned against driving when sleepy - understanding that sleepiness will vary on a day to day basis

## 2014-11-27 NOTE — Patient Instructions (Signed)
You have moderate obstructive sleep apnea  You are on CPAP 8 cm We will provide you with a chinstrap & set you up with new DME - [facility] Albuterol MDI 2 puffs as needed every 8h - Excess use may cause your heart rate to increase Take your fluid pill daily - watch your weight daily You also have a component of anxiety caused by your breathing

## 2014-11-27 NOTE — Assessment & Plan Note (Addendum)
It seems that dyspnea is multifactorial-with deconditioning and weight gain, she does not seem to have significant obstructive lung disease or cardiac disease. I will provide her with an albuterol inhaler only for rescue Albuterol MDI 2 puffs as needed every 8h - Excess use may cause your heart rate to increase Take your fluid pill daily - watch your weight daily You also have a component of anxiety caused by your breathing

## 2014-12-09 ENCOUNTER — Encounter: Payer: Self-pay | Admitting: Internal Medicine

## 2014-12-09 ENCOUNTER — Ambulatory Visit (INDEPENDENT_AMBULATORY_CARE_PROVIDER_SITE_OTHER): Payer: Medicare Other | Admitting: Internal Medicine

## 2014-12-09 VITALS — BP 118/72 | HR 96 | Ht 61.0 in | Wt 228.6 lb

## 2014-12-09 DIAGNOSIS — G4733 Obstructive sleep apnea (adult) (pediatric): Secondary | ICD-10-CM

## 2014-12-09 DIAGNOSIS — I482 Chronic atrial fibrillation, unspecified: Secondary | ICD-10-CM

## 2014-12-09 DIAGNOSIS — I10 Essential (primary) hypertension: Secondary | ICD-10-CM | POA: Diagnosis not present

## 2014-12-09 NOTE — Progress Notes (Signed)
Electrophysiology Office Note   Date:  12/09/2014   ID:  Holly, Smith 1945-06-03, MRN [id number]  PCP:  Otilio Miu, MD  Cardiologist:  Previously Dr Gigi Gin, currently Dr Irish Lack Primary Electrophysiologist: Thompson Grayer, MD    Chief Complaint  Patient presents with  . Chronic AFIB     History of Present Illness: Holly Smith is a 69 y.o. female who presents today for electrophysiology evaluation.  She has a h/o tachycardia mediated cardiomyopathy (now improved), longstanding persistent AFib, HTN, HL, OSA and obesity.  She initially presented for routine preoperative GYN evaluation and was found to have afib.  She was minimally symptomatic at that time.  She subsequently developed tachycardia mediated cardiomyopathy.  She underwent cardioversion 06/[facility] at [facility] by Dr Josefa Half previously.  Follow-up notes indicate she was back in atrial fibrillation by June 2015.  She has failed medical therapy with Propafenone and Amiodarone. She established with Dr. Casandra Doffing 09/16/14 and noted symptoms of DOE. He placed her on low dose Lasix 3 x a week. Her symptoms resolved with diuresis.  She has modest obesity.  She has recently started dieting and has lost 6 lbs.  She sleeps with CPAP nightly. She has dependent pedal edema without significant change.  She does have a cough that is nonproductive. She notes occasional wheezing. She saw Dr. Melvyn Novas recently for pulmonology evaluation. He changed her ACE inhibitor to ARB. He also placed on PPI.   Past Medical History  Diagnosis Date  . Vulvar cancer     invasive verrucous carcinoma of the vulva. History of genital warts and vulvar condyloma  . HPV (human papilloma virus) anogenital infection     03/21/12 PAP + HR HPV  . Abnormal Pap smear of vagina and vaginal HPV   . Hypertension   . COPD (chronic obstructive pulmonary disease)   . Obesity   . Chronic anticoagulation     She is on Xarelto for Afib  . Atrial  fibrillation   . Echocardiogram abnormal     Mitral regurgitation, tricuspic regurgitation  . Sleep apnea   . Urolithiasis   . Postoperative nausea and vomiting   . CHF (congestive heart failure)   . Insulin resistance   . Hyperlipidemia   . History of stress test     Myoview 8/16: EF 42%, anteroseptal, inferoseptal, anterior, apical anterior and apical defect (likely represents breast attenuation versus scar), no ischemia; intermediate risk because of low EF   Past Surgical History  Procedure Laterality Date  . Vulvectomy Right 05/10/2014    Excisional biopsy of the superior right labial majora mass  . Abdominal hysterectomy    . Bilateral salpingoophorectomy  2003    benign ovarian cancer  . Lithotripsy    . Vulvectomy partial  07/02/2014    Re-excision and sentinel node dissection at [facility].      Current Outpatient Prescriptions  Medication Sig Dispense Refill  . acetaminophen (TYLENOL) 500 MG tablet Take 500 mg by mouth every 6 (six) hours as needed for mild pain.    Marland Kitchen albuterol (PROVENTIL HFA;VENTOLIN HFA) 108 (90 BASE) MCG/ACT inhaler Inhale 2 puffs into the lungs every 8 (eight) hours as needed for wheezing or shortness of breath. 1 Inhaler 6  . furosemide (LASIX) 20 MG tablet Take 10 mg by mouth daily.     . irbesartan (AVAPRO) 75 MG tablet Take 1/2 tablet by mouth daily    . metoprolol (LOPRESSOR) 50 MG tablet Take 75 mg by mouth 2 (two) times  daily.    . omeprazole (PRILOSEC) 20 MG capsule Take 20 mg by mouth daily before breakfast.    . rivaroxaban (XARELTO) 20 MG TABS tablet Take 20 mg by mouth daily.    . solifenacin (VESICARE) 5 MG tablet Take 1 tablet by mouth daily.     No current facility-administered medications for this visit.    Allergies:   Review of patient's allergies indicates no known allergies.   Social History:  The patient  reports that she quit smoking about 2 years ago. Her smoking use included Cigarettes and E-cigarettes. She has a 50 pack-year  smoking history. She has never used smokeless tobacco. She reports that she does not drink alcohol or use illicit drugs.   Family History:  The patient's  family history includes Diabetes in her mother; Healthy in her sister; Heart attack in her brother and father; Hypertension in her mother; Prostate cancer (age of onset: 16) in her brother; Stroke in her mother.    ROS:  Please see the history of present illness.   All other systems are reviewed and negative.    PHYSICAL EXAM: VS:  BP 118/72 mmHg  Pulse 96  Ht 5\' 1"  (1.549 m)  Wt 228 lb 9.6 oz (103.692 kg)  BMI 43.22 kg/m2 , BMI Body mass index is 43.22 kg/(m^2). GEN: morbidly obese, in no acute distress HEENT: normal Neck: no JVD, carotid bruits, or masses Cardiac: iRRR; no murmurs, rubs, or gallops,no edema  Respiratory:  clear to auscultation bilaterally, normal work of breathing GI: soft, nontender, nondistended, + BS MS: no deformity or atrophy Skin: warm and dry  Neuro:  Strength and sensation are intact Psych: euthymic mood, full affect  EKG:  EKG is ordered today. The ekg ordered today shows afib, V rate 96 bpm, incomplete RBBB, nonspecific St/T changes   Recent Labs: 09/16/2014: ALT 13; Pro B Natriuretic peptide (BNP) 119.0* 11/13/2014: BUN 16; Creatinine, Ser 0.82; Potassium 5.0; Sodium 141    Lipid Panel  No results found for: CHOL, TRIG, HDL, CHOLHDL, VLDL, LDLCALC, LDLDIRECT   Wt Readings from Last 3 Encounters:  12/09/14 228 lb 9.6 oz (103.692 kg)  11/27/14 233 lb (105.688 kg)  11/18/14 232 lb (105.235 kg)      Other studies Reviewed: Additional studies/ records that were reviewed today include: epic records including Dr Hassell Done notes, prior echo, and Leta Speller notes are reviewed   ASSESSMENT AND PLAN:  1.  Longstanding persistent atrial fibrillation She has been in afib for at least a year.  She has failed medical therapy with propafenone and amiodarone.  She has been minimally symptomatic with  afib, but has had some difficulty with CHF in the setting of poor rate control. She seems to be doing a little better at this time. Therapeutic strategies for afib including rate control and rhythm control were discussed in detail with the patient today.  We discussed lifestyle modification including regular exercise and weight reduction strategies.  She is most interested in this approach.  We discussed tikosyn and ablation (reduced anticipated success rates).  She is clear that presently, she would prefer rate control. chads2vasc score is at least 4.  Continue xarelto.  2. CHF EF has improved Continue medical therapy  3. Obesity Body mass index is 43.22 kg/(m^2). Weight reduction strongly encouraged  4. OSA Compliance with CPAP is encouraged    Follow-up:  Return to see me in 3 months  Current medicines are reviewed at length with the patient today.   The  patient does not have concerns regarding her medicines.  The following changes were made today:  none  Signed, Thompson Grayer, MD  12/09/2014 5:17 PM     [address] [place] [address] [phone] (office) [phone] (fax)

## 2014-12-09 NOTE — Patient Instructions (Signed)
Medication Instructions:  Your physician recommends that you continue on your current medications as directed. Please refer to the Current Medication list given to you today.   Labwork: None ordered  Testing/Procedures: None ordered  Follow-Up: Your physician recommends that you schedule a follow-up appointment in: 3 months with Dr Allred   Any Other Special Instructions Will Be Listed Below (If Applicable).   

## 2014-12-11 ENCOUNTER — Ambulatory Visit: Payer: Medicare Other

## 2014-12-16 ENCOUNTER — Encounter: Payer: Self-pay | Admitting: *Deleted

## 2014-12-16 DIAGNOSIS — R0683 Snoring: Secondary | ICD-10-CM | POA: Insufficient documentation

## 2014-12-16 DIAGNOSIS — Z87898 Personal history of other specified conditions: Secondary | ICD-10-CM | POA: Insufficient documentation

## 2014-12-16 DIAGNOSIS — R000 Tachycardia, unspecified: Secondary | ICD-10-CM | POA: Insufficient documentation

## 2014-12-16 DIAGNOSIS — E8881 Metabolic syndrome: Secondary | ICD-10-CM

## 2014-12-16 DIAGNOSIS — J449 Chronic obstructive pulmonary disease, unspecified: Secondary | ICD-10-CM | POA: Insufficient documentation

## 2014-12-16 DIAGNOSIS — Z8768 Personal history of other (corrected) conditions arising in the perinatal period: Secondary | ICD-10-CM | POA: Insufficient documentation

## 2014-12-16 DIAGNOSIS — I493 Ventricular premature depolarization: Secondary | ICD-10-CM

## 2014-12-16 DIAGNOSIS — N393 Stress incontinence (female) (male): Secondary | ICD-10-CM | POA: Insufficient documentation

## 2014-12-16 DIAGNOSIS — I10 Essential (primary) hypertension: Secondary | ICD-10-CM

## 2014-12-16 DIAGNOSIS — E785 Hyperlipidemia, unspecified: Secondary | ICD-10-CM | POA: Insufficient documentation

## 2014-12-16 DIAGNOSIS — N200 Calculus of kidney: Secondary | ICD-10-CM | POA: Insufficient documentation

## 2014-12-16 DIAGNOSIS — N209 Urinary calculus, unspecified: Secondary | ICD-10-CM | POA: Insufficient documentation

## 2014-12-16 HISTORY — DX: Metabolic syndrome: E88.81

## 2014-12-16 HISTORY — DX: Ventricular premature depolarization: I49.3

## 2014-12-16 HISTORY — DX: Chronic obstructive pulmonary disease, unspecified: J44.9

## 2014-12-16 HISTORY — DX: Essential (primary) hypertension: I10

## 2014-12-18 ENCOUNTER — Inpatient Hospital Stay: Payer: Medicare Other | Attending: Obstetrics and Gynecology | Admitting: Obstetrics and Gynecology

## 2014-12-18 VITALS — BP 161/93 | HR 85 | Temp 97.4°F | Wt 228.8 lb

## 2014-12-18 DIAGNOSIS — Z7901 Long term (current) use of anticoagulants: Secondary | ICD-10-CM | POA: Diagnosis not present

## 2014-12-18 DIAGNOSIS — G4733 Obstructive sleep apnea (adult) (pediatric): Secondary | ICD-10-CM | POA: Insufficient documentation

## 2014-12-18 DIAGNOSIS — I482 Chronic atrial fibrillation: Secondary | ICD-10-CM | POA: Diagnosis not present

## 2014-12-18 DIAGNOSIS — G4730 Sleep apnea, unspecified: Secondary | ICD-10-CM | POA: Insufficient documentation

## 2014-12-18 DIAGNOSIS — J449 Chronic obstructive pulmonary disease, unspecified: Secondary | ICD-10-CM | POA: Insufficient documentation

## 2014-12-18 DIAGNOSIS — A630 Anogenital (venereal) warts: Secondary | ICD-10-CM | POA: Insufficient documentation

## 2014-12-18 DIAGNOSIS — Z79899 Other long term (current) drug therapy: Secondary | ICD-10-CM | POA: Insufficient documentation

## 2014-12-18 DIAGNOSIS — C519 Malignant neoplasm of vulva, unspecified: Secondary | ICD-10-CM | POA: Diagnosis not present

## 2014-12-18 DIAGNOSIS — I10 Essential (primary) hypertension: Secondary | ICD-10-CM | POA: Diagnosis not present

## 2014-12-18 DIAGNOSIS — I509 Heart failure, unspecified: Secondary | ICD-10-CM | POA: Diagnosis not present

## 2014-12-18 DIAGNOSIS — I340 Nonrheumatic mitral (valve) insufficiency: Secondary | ICD-10-CM | POA: Insufficient documentation

## 2014-12-18 DIAGNOSIS — Z87891 Personal history of nicotine dependence: Secondary | ICD-10-CM | POA: Insufficient documentation

## 2014-12-18 DIAGNOSIS — E669 Obesity, unspecified: Secondary | ICD-10-CM | POA: Diagnosis not present

## 2014-12-18 NOTE — Progress Notes (Signed)
Assisted MD with pelvic exam

## 2014-12-18 NOTE — Progress Notes (Signed)
Gynecologic Oncology Interval Note  Referring Provider: Dr. Laverta Baltimore  Chief Concern: Vulvar cancer surveillance.   Subjective:  Holly Smith is a 69 y.o. woman who presents today for continued surveillance for history of invasive vulvar cancer.   No new complaints.  Still has urinary leakage for which she uses Vesicare.  PAP 3/16 normal.  Oncology Treatment History:  Patient had warty lesion on the upper right labia for about two years.    Hysterectomy in the past for benign disease.  Then BSO in 2003 for benign ovarian tumor.  03/21/12 PAP + HR HPV vaginal bx 2 o'clock negative, 9 o'clock HPV effect  Vulvar itching and pain.  07/18/12 - vulvar bx showed condyloma  2/29/79 - vulvar bx - lichen sclerosis  8/92/11 - Exophytic mass on right anterior vulva. 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  Negative margins, but within 77mm of cancer.  No LVSI.    We discussed the rationale for re-excision of the scar to insure that there is no residual cancer present in view of the close 2 mm margin and sentinel lymph node mapping and biopsy because the tumor has > 54mm invasion.  Had partial right modified radical vulvectomy on 07/02/14 at [facility] for invasive verrucous carcinoma of the vulva. The final pathology revealed: negative lymph nodes, no evidence of residual disease.      Problem List: Patient Active Problem List   Diagnosis Date Noted  . CAFL (chronic airflow limitation) 12/16/2014  . Personal history of perinatal problems 12/16/2014  . BP (high blood pressure) 12/16/2014  . HLD (hyperlipidemia) 12/16/2014  . Dysmetabolic syndrome 94/17/4081  . Calculus of kidney 12/16/2014  . Beat, premature ventricular 12/16/2014  . Snores 12/16/2014  . Stress incontinence 12/16/2014  . Fast heart beat 12/16/2014  . Calculus (=stone) 12/16/2014  . OSA (obstructive sleep apnea) 11/27/2014  . Dyspnea 10/07/2014  .  Essential hypertension 10/07/2014  . Obesity 10/07/2014  . Genital warts 08/02/2014  . Condyloma acuminatum of vulva 08/02/2014  . Condylomata lata of vulva 08/02/2014  . History of anticoagulant therapy 08/02/2014  . Chronic atrial fibrillation 08/02/2014  . Current tobacco use 08/02/2014  . 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 Vulvar cancer 05/10/2014  . Vulvar cancer, carcinoma 05/10/2014  . 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 Chest pain 08/20/2013  . Breath shortness 08/20/2013  . CCF (congestive cardiac failure) 08/03/2013  . History of biliary T-tube placement 06/21/2013  . Abnormal echocardiogram 04/23/2013  . MI (mitral incompetence) 04/23/2013  . TI (tricuspid incompetence) 04/23/2013    Past Medical History: Past Medical History  Diagnosis Date  . Vulvar cancer     invasive verrucous carcinoma of the vulva. History of genital warts and vulvar condyloma  . HPV (human papilloma virus) anogenital infection     03/21/12 PAP + HR HPV  . Abnormal Pap smear of vagina and vaginal HPV   . Hypertension   . COPD (chronic obstructive pulmonary disease)   . Obesity   . Chronic anticoagulation     She is on Xarelto for Afib  . Atrial fibrillation   . Echocardiogram abnormal     Mitral regurgitation, tricuspic regurgitation  . Sleep apnea   . Urolithiasis   . Postoperative nausea and vomiting   . CHF (congestive heart failure)   . Insulin resistance   . Hyperlipidemia   . History of stress test     Myoview 8/16:  EF 42%, anteroseptal, inferoseptal, anterior, apical anterior and apical defect (likely represents breast attenuation versus scar), no ischemia; intermediate risk because of low EF    Past Surgical History: Past Surgical History  Procedure Laterality Date  . Vulvectomy Right 05/10/2014    Excisional biopsy of the superior right labial majora mass  . Abdominal hysterectomy    . Bilateral salpingoophorectomy  2003    benign ovarian cancer   . Lithotripsy    . Vulvectomy partial  07/02/2014    Re-excision and sentinel node dissection at [facility].     Family History: Family History  Problem Relation Age of Onset  . Prostate cancer Brother 65    still living and well  . Heart attack Father   . Stroke Mother   . Diabetes Mother   . Hypertension Mother   . Heart attack Brother   . Healthy Sister     Social History: Social History   Social History  . Marital Status: Married    Spouse Name: N/A  . Number of Children: N/A  . Years of Education: N/A   Occupational History  . Not on file.   Social History Main Topics  . Smoking status: Former Smoker -- 1.00 packs/day for 50 years    Types: Cigarettes, E-cigarettes    Quit date: 03/22/2012  . Smokeless tobacco: Never Used  . Alcohol Use: No  . Drug Use: No  . Sexual Activity: Not on file   Other Topics Concern  . Not on file   Social History Narrative    Allergies: No Known Allergies  Current Medications: Current Outpatient Prescriptions  Medication Sig Dispense Refill  . acetaminophen (TYLENOL) 500 MG tablet Take 500 mg by mouth every 6 (six) hours as needed for mild pain.    Marland Kitchen albuterol (PROVENTIL HFA;VENTOLIN HFA) 108 (90 BASE) MCG/ACT inhaler Inhale 2 puffs into the lungs every 8 (eight) hours as needed for wheezing or shortness of breath. 1 Inhaler 6  . furosemide (LASIX) 20 MG tablet Take 10 mg by mouth daily.     . irbesartan (AVAPRO) 75 MG tablet Take 1/2 tablet by mouth daily    . metoprolol (LOPRESSOR) 50 MG tablet Take 75 mg by mouth 2 (two) times daily.    Marland Kitchen omeprazole (PRILOSEC) 20 MG capsule Take 20 mg by mouth daily before breakfast.    . rivaroxaban (XARELTO) 20 MG TABS tablet Take 20 mg by mouth daily.    . solifenacin (VESICARE) 5 MG tablet Take 1 tablet by mouth daily.     No current facility-administered medications for this visit.      Review of Systems Pertinent items are noted in HPI.  Objective:  Physical Examination:   There were no vitals taken for this visit.  ECOG Performance Status: 0 - Asymptomatic  General appearance: alert, cooperative and appears stated age HEENT:PERRLA, neck supple with midline trachea and thyroid without masses Lymph node survey: non-palpable, axillary, inguinal, supraclavicular Cardiovascular: regular rate and rhythm, no murmurs or gallops Respiratory: normal air entry, lungs clear to auscultation Breast exam: not examined. Abdomen: soft, non-tender, without masses or organomegaly, no hernias and well healed incision Back: inspection of back is normal Extremities: extremities normal, atraumatic, no cyanosis or edema Skin exam - normal coloration and turgor, no rashes, no suspicious skin lesions noted. Neurological exam reveals alert, oriented, normal speech, no focal findings or movement disorder noted.  Pelvic: exam chaperoned by nurse;  Vulva: normal appearing vulva with no masses, tenderness or lesions s/p partial  vulvectomy; Vagina: normal vagina; Adnexa: normal adnexa in size, nontender and no masses; Rectal: normal rectal, no masses  Assessment:  Marcene Brawn with a history of invasive vulvar cancer s/p excision with negative SLNs.  No evidence of disease.   Plan:   Problem List Items Addressed This Visit      Musculoskeletal and Integument   Condyloma acuminatum of vulva     Genitourinary   Vulvar cancer, carcinoma - Primary       Reassurance given.  Suggested return to clinic in  4 months.  She is comfortable with the plan and had her questions answered. I offered referral to Michelene Heady for management of incontinence and she may consider this next year.   Mellody Drown, MD  CC:  Juline Patch, MD [address] [place], [place] [address] [phone]

## 2014-12-23 ENCOUNTER — Telehealth: Payer: Self-pay | Admitting: Internal Medicine

## 2014-12-23 ENCOUNTER — Telehealth: Payer: Self-pay | Admitting: Physician Assistant

## 2014-12-23 NOTE — Telephone Encounter (Deleted)
Error

## 2014-12-23 NOTE — Telephone Encounter (Signed)
Patient calling the office for samples of medication:   1.  What medication and dosage are you requesting samples for? Xarelto 20mg    2.  Are you currently out of this medication? No   3. Are you requesting samples to get you through until a mail order prescription arrives? No , patient is in the donut hole

## 2014-12-26 DIAGNOSIS — G4733 Obstructive sleep apnea (adult) (pediatric): Secondary | ICD-10-CM | POA: Diagnosis not present

## 2014-12-26 NOTE — Telephone Encounter (Signed)
Provided 1 month supply Xarelto 20 mg for patient

## 2015-01-21 ENCOUNTER — Encounter (HOSPITAL_COMMUNITY): Payer: Self-pay | Admitting: Emergency Medicine

## 2015-01-21 ENCOUNTER — Observation Stay (HOSPITAL_COMMUNITY)
Admission: EM | Admit: 2015-01-21 | Discharge: 2015-01-24 | Disposition: A | Discharge status: Home / Self Care | Payer: Medicare Other | Attending: Cardiology | Admitting: Cardiology

## 2015-01-21 ENCOUNTER — Emergency Department (HOSPITAL_COMMUNITY): Payer: Medicare Other

## 2015-01-21 ENCOUNTER — Telehealth: Payer: Self-pay | Admitting: Internal Medicine

## 2015-01-21 DIAGNOSIS — E785 Hyperlipidemia, unspecified: Secondary | ICD-10-CM | POA: Diagnosis not present

## 2015-01-21 DIAGNOSIS — I5033 Acute on chronic diastolic (congestive) heart failure: Secondary | ICD-10-CM | POA: Diagnosis not present

## 2015-01-21 DIAGNOSIS — I481 Persistent atrial fibrillation: Secondary | ICD-10-CM | POA: Diagnosis not present

## 2015-01-21 DIAGNOSIS — I110 Hypertensive heart disease with heart failure: Principal | ICD-10-CM | POA: Insufficient documentation

## 2015-01-21 DIAGNOSIS — R000 Tachycardia, unspecified: Secondary | ICD-10-CM | POA: Diagnosis not present

## 2015-01-21 DIAGNOSIS — I493 Ventricular premature depolarization: Secondary | ICD-10-CM | POA: Diagnosis not present

## 2015-01-21 DIAGNOSIS — I429 Cardiomyopathy, unspecified: Secondary | ICD-10-CM | POA: Diagnosis not present

## 2015-01-21 DIAGNOSIS — I160 Hypertensive urgency: Secondary | ICD-10-CM | POA: Diagnosis present

## 2015-01-21 DIAGNOSIS — I482 Chronic atrial fibrillation, unspecified: Secondary | ICD-10-CM | POA: Diagnosis present

## 2015-01-21 DIAGNOSIS — I5021 Acute systolic (congestive) heart failure: Secondary | ICD-10-CM | POA: Diagnosis not present

## 2015-01-21 DIAGNOSIS — K625 Hemorrhage of anus and rectum: Secondary | ICD-10-CM | POA: Insufficient documentation

## 2015-01-21 DIAGNOSIS — Z23 Encounter for immunization: Secondary | ICD-10-CM | POA: Insufficient documentation

## 2015-01-21 DIAGNOSIS — Z6841 Body Mass Index (BMI) 40.0 and over, adult: Secondary | ICD-10-CM | POA: Insufficient documentation

## 2015-01-21 DIAGNOSIS — Z7901 Long term (current) use of anticoagulants: Secondary | ICD-10-CM

## 2015-01-21 DIAGNOSIS — R110 Nausea: Secondary | ICD-10-CM | POA: Diagnosis not present

## 2015-01-21 DIAGNOSIS — Z79899 Other long term (current) drug therapy: Secondary | ICD-10-CM | POA: Diagnosis not present

## 2015-01-21 DIAGNOSIS — K921 Melena: Secondary | ICD-10-CM | POA: Diagnosis present

## 2015-01-21 DIAGNOSIS — G4733 Obstructive sleep apnea (adult) (pediatric): Secondary | ICD-10-CM | POA: Diagnosis not present

## 2015-01-21 DIAGNOSIS — Z9229 Personal history of other drug therapy: Secondary | ICD-10-CM | POA: Diagnosis not present

## 2015-01-21 DIAGNOSIS — K219 Gastro-esophageal reflux disease without esophagitis: Secondary | ICD-10-CM | POA: Diagnosis not present

## 2015-01-21 DIAGNOSIS — R0602 Shortness of breath: Secondary | ICD-10-CM | POA: Diagnosis not present

## 2015-01-21 DIAGNOSIS — J449 Chronic obstructive pulmonary disease, unspecified: Secondary | ICD-10-CM | POA: Diagnosis not present

## 2015-01-21 DIAGNOSIS — Z8544 Personal history of malignant neoplasm of other female genital organs: Secondary | ICD-10-CM | POA: Diagnosis not present

## 2015-01-21 DIAGNOSIS — Z87891 Personal history of nicotine dependence: Secondary | ICD-10-CM | POA: Insufficient documentation

## 2015-01-21 DIAGNOSIS — R0989 Other specified symptoms and signs involving the circulatory and respiratory systems: Secondary | ICD-10-CM | POA: Diagnosis not present

## 2015-01-21 DIAGNOSIS — I5031 Acute diastolic (congestive) heart failure: Secondary | ICD-10-CM

## 2015-01-21 HISTORY — DX: Melena: K92.1

## 2015-01-21 HISTORY — DX: Chronic atrial fibrillation, unspecified: I48.20

## 2015-01-21 HISTORY — DX: Calculus of kidney: N20.0

## 2015-01-21 HISTORY — DX: Acute on chronic diastolic (congestive) heart failure: I50.33

## 2015-01-21 HISTORY — DX: Gastro-esophageal reflux disease without esophagitis: K21.9

## 2015-01-21 HISTORY — DX: Unspecified osteoarthritis, unspecified site: M19.90

## 2015-01-21 HISTORY — DX: Hypertensive urgency: I16.0

## 2015-01-21 HISTORY — DX: Acute diastolic (congestive) heart failure: I50.31

## 2015-01-21 HISTORY — DX: Obstructive sleep apnea (adult) (pediatric): G47.33

## 2015-01-21 HISTORY — DX: Obstructive sleep apnea (adult) (pediatric): Z99.89

## 2015-01-21 LAB — CBC WITH DIFFERENTIAL/PLATELET
BASOS ABS: 0 10*3/uL (ref 0.0–0.1)
BASOS PCT: 0 %
Eosinophils Absolute: 0.1 10*3/uL (ref 0.0–0.7)
Eosinophils Relative: 1 %
HEMATOCRIT: 41.8 % (ref 36.0–46.0)
HEMOGLOBIN: 13.5 g/dL (ref 12.0–15.0)
LYMPHS PCT: 15 %
Lymphs Abs: 1.4 10*3/uL (ref 0.7–4.0)
MCH: 30.8 pg (ref 26.0–34.0)
MCHC: 32.3 g/dL (ref 30.0–36.0)
MCV: 95.4 fL (ref 78.0–100.0)
Monocytes Absolute: 0.7 10*3/uL (ref 0.1–1.0)
Monocytes Relative: 8 %
NEUTROS ABS: 7.6 10*3/uL (ref 1.7–7.7)
NEUTROS PCT: 76 %
Platelets: 231 10*3/uL (ref 150–400)
RBC: 4.38 MIL/uL (ref 3.87–5.11)
RDW: 13 % (ref 11.5–15.5)
WBC: 9.8 10*3/uL (ref 4.0–10.5)

## 2015-01-21 LAB — BRAIN NATRIURETIC PEPTIDE: B NATRIURETIC PEPTIDE 5: 651.5 pg/mL — AB (ref 0.0–100.0)

## 2015-01-21 LAB — I-STAT CHEM 8, ED
BUN: 20 mg/dL (ref 6–20)
CHLORIDE: 103 mmol/L (ref 101–111)
CREATININE: 0.9 mg/dL (ref 0.44–1.00)
Calcium, Ion: 1.17 mmol/L (ref 1.13–1.30)
GLUCOSE: 172 mg/dL — AB (ref 65–99)
HEMATOCRIT: 47 % — AB (ref 36.0–46.0)
HEMOGLOBIN: 16 g/dL — AB (ref 12.0–15.0)
POTASSIUM: 3.5 mmol/L (ref 3.5–5.1)
Sodium: 143 mmol/L (ref 135–145)
TCO2: 23 mmol/L (ref 0–100)

## 2015-01-21 LAB — COMPREHENSIVE METABOLIC PANEL
ALK PHOS: 56 U/L (ref 38–126)
ALT: 27 U/L (ref 14–54)
AST: 25 U/L (ref 15–41)
Albumin: 3.6 g/dL (ref 3.5–5.0)
Anion gap: 10 (ref 5–15)
BUN: 16 mg/dL (ref 6–20)
CALCIUM: 9.3 mg/dL (ref 8.9–10.3)
CHLORIDE: 105 mmol/L (ref 101–111)
CO2: 25 mmol/L (ref 22–32)
CREATININE: 0.96 mg/dL (ref 0.44–1.00)
GFR, EST NON AFRICAN AMERICAN: 58 mL/min — AB (ref >60)
Glucose, Bld: 169 mg/dL — ABNORMAL HIGH (ref 65–99)
Potassium: 3.6 mmol/L (ref 3.5–5.1)
Sodium: 140 mmol/L (ref 135–145)
Total Bilirubin: 0.9 mg/dL (ref 0.3–1.2)
Total Protein: 7.1 g/dL (ref 6.5–8.1)

## 2015-01-21 LAB — VITAMIN B12: Vitamin B-12: 362 pg/mL (ref 180–914)

## 2015-01-21 LAB — FERRITIN: Ferritin: 97 ng/mL (ref 11–307)

## 2015-01-21 LAB — PROTIME-INR
INR: 1.85 — ABNORMAL HIGH (ref 0.00–1.49)
PROTHROMBIN TIME: 21.3 s — AB (ref 11.6–15.2)

## 2015-01-21 LAB — FOLATE: FOLATE: 17.1 ng/mL (ref >5.9)

## 2015-01-21 LAB — RETICULOCYTES
RBC.: 4.38 MIL/uL (ref 3.87–5.11)
Retic Count, Absolute: 96.4 10*3/uL (ref 19.0–186.0)
Retic Ct Pct: 2.2 % (ref 0.4–3.1)

## 2015-01-21 LAB — IRON AND TIBC
IRON: 99 ug/dL (ref 28–170)
SATURATION RATIOS: 23 % (ref 10.4–31.8)
TIBC: 435 ug/dL (ref 250–450)
UIBC: 336 ug/dL

## 2015-01-21 LAB — TYPE AND SCREEN
ABO/RH(D): O NEG
Antibody Screen: NEGATIVE

## 2015-01-21 LAB — I-STAT TROPONIN, ED: Troponin i, poc: 0.01 ng/mL (ref 0.00–0.08)

## 2015-01-21 LAB — APTT: APTT: 37 s (ref 24–37)

## 2015-01-21 LAB — ABO/RH: ABO/RH(D): O NEG

## 2015-01-21 MED ORDER — NITROGLYCERIN 0.4 MG SL SUBL: 0.4000 mg | SUBLINGUAL_TABLET | SUBLINGUAL | Status: DC | PRN

## 2015-01-21 MED ORDER — ACETAMINOPHEN 325 MG PO TABS
650.0000 mg | ORAL_TABLET | ORAL | Status: DC | PRN
  Administered 2015-01-21: 650 mg via ORAL
  Filled 2015-01-21 (×2): qty 2

## 2015-01-21 MED ORDER — KETOROLAC TROMETHAMINE 30 MG/ML IJ SOLN
30.0000 mg | Freq: Once | INTRAMUSCULAR | Status: AC
  Administered 2015-01-21: 30 mg via INTRAVENOUS
  Filled 2015-01-21: qty 1

## 2015-01-21 MED ORDER — IRBESARTAN 75 MG PO TABS
37.5000 mg | ORAL_TABLET | Freq: Every day | ORAL | Status: DC
  Administered 2015-01-22 – 2015-01-24 (×3): 37.5 mg via ORAL
  Filled 2015-01-21 (×5): qty 0.5

## 2015-01-21 MED ORDER — FUROSEMIDE 10 MG/ML IJ SOLN
40.0000 mg | INTRAMUSCULAR | Status: AC
  Administered 2015-01-21: 40 mg via INTRAVENOUS
  Filled 2015-01-21: qty 4

## 2015-01-21 MED ORDER — PANTOPRAZOLE SODIUM 40 MG PO TBEC
40.0000 mg | DELAYED_RELEASE_TABLET | Freq: Every day | ORAL | Status: DC
  Administered 2015-01-22 – 2015-01-24 (×3): 40 mg via ORAL
  Filled 2015-01-21 (×3): qty 1

## 2015-01-21 MED ORDER — ONDANSETRON HCL 4 MG/2ML IJ SOLN
4.0000 mg | Freq: Four times a day (QID) | INTRAMUSCULAR | Status: DC | PRN
  Administered 2015-01-23: 4 mg via INTRAVENOUS
  Filled 2015-01-21: qty 2

## 2015-01-21 MED ORDER — FLUTICASONE PROPIONATE 50 MCG/ACT NA SUSP
1.0000 | Freq: Two times a day (BID) | NASAL | Status: DC | PRN
  Filled 2015-01-21: qty 16

## 2015-01-21 MED ORDER — ALBUTEROL SULFATE (2.5 MG/3ML) 0.083% IN NEBU: 3.0000 mL | INHALATION_SOLUTION | Freq: Three times a day (TID) | RESPIRATORY_TRACT | Status: DC | PRN

## 2015-01-21 MED ORDER — RIVAROXABAN 20 MG PO TABS
20.0000 mg | ORAL_TABLET | Freq: Every day | ORAL | Status: DC
  Administered 2015-01-21 – 2015-01-23 (×3): 20 mg via ORAL
  Filled 2015-01-21 (×3): qty 1

## 2015-01-21 MED ORDER — METOPROLOL TARTRATE 100 MG PO TABS
100.0000 mg | ORAL_TABLET | Freq: Two times a day (BID) | ORAL | Status: DC
  Administered 2015-01-21 – 2015-01-24 (×6): 100 mg via ORAL
  Filled 2015-01-21 (×6): qty 1

## 2015-01-21 MED ORDER — FUROSEMIDE 10 MG/ML IJ SOLN
40.0000 mg | Freq: Two times a day (BID) | INTRAMUSCULAR | Status: DC
  Administered 2015-01-21 – 2015-01-24 (×6): 40 mg via INTRAVENOUS
  Filled 2015-01-21 (×6): qty 4

## 2015-01-21 MED ORDER — ASPIRIN EC 81 MG PO TBEC
81.0000 mg | DELAYED_RELEASE_TABLET | Freq: Every day | ORAL | Status: DC
  Administered 2015-01-22 – 2015-01-23 (×2): 81 mg via ORAL
  Filled 2015-01-21 (×2): qty 1

## 2015-01-21 NOTE — ED Notes (Signed)
Patient states congestive heart failure and shortness of breath.  Patient also states has had blood in stools x 2 weeks and on xarelto.   Patient denies other problems.

## 2015-01-21 NOTE — Telephone Encounter (Signed)
Pt c/o Shortness Of Breath: STAT if SOB developed within the last 24 hours or pt is noticeably SOB on the phone  1. Are you currently SOB (can you hear that pt is SOB on the phone)? Yes  2. How long have you been experiencing SOB? Doesn't know  3. Are you SOB when sitting or when up moving around? Sitting  4. Are you currently experiencing any other symptoms? Doesn't know   Pt's daughter in law calling, states she is not with the pt but the pt called her husband about this. Please call back.

## 2015-01-21 NOTE — ED Notes (Signed)
MD at bedside. 

## 2015-01-21 NOTE — Telephone Encounter (Signed)
Spoke with son who was at patient's residence and he says he has "got to get her somewhere now".  He says she has been passing bright red blood from her stool for 2 weeks and yesterday developed SOB and she can "not breath" now.  He says she is "burning up" like she has a fever.  I have advised they call 911 and get her to the hospital but he says if he calls 911 they  will take her to [facility] and "that is not happening" so he is going to bring her to [facility] ER.

## 2015-01-21 NOTE — ED Notes (Signed)
Hemacult negative

## 2015-01-21 NOTE — H&P (Signed)
Patient ID: Holly Smith MRN: [id number], DOB/AGE: [birth year]   Admit date: 01/21/2015  Cardiologist: Previously Dr Gigi Gin, currently Dr Irish Lack Primary Electrophysiologist: Thompson Grayer, MD   HPI:  Holly Smith is a 69 y.o. female with a hx of probable tachycardia mediated cardiomyopathy, systolic HF and persistent/chronic AFib s/p prior DCCV, HTN, HL, OSA on CPAP whopresented to [facility] ED for evaluation worsening sob.   She went to the hospital for preoperative evaluation for a GYN surgery. ECG demonstrated atrial fibrillation at that time. She was in the hospital at [facility] in 06/2013 and underwent cardioversion with restoration of normal sinus rhythm. Her EF was 35% at that time. She developed acute systolic heart failure in the hospital and required IV diuresis. She was then placed on IV amiodarone due to rapid rate and converted to normal sinus rhythm. She remained on amiodarone for at least 2-3 months after this. Follow-up notes indicate she was back in atrial fibrillation by June 2015. She has failed medical therapy with Propafenone and Amiodarone. She has seen Dr. Josefa Half at [facility] in the past. She declined PVI ablation in the past.   She established with Dr. Casandra Doffing 09/16/14 and noted symptoms of DOE. He placed her on low dose Lasix 3 x a week. It was felt that if her symptoms do not improve with diuresis, she will need pursue a strategy of rhythm control for her AFib. FU echo 09/25/14 demonstrated normal LVF (55-60%) with mild MR.   Seen by Richardson Dopp in clinic 10/24/14 and advised to take lasix po 10mg  daily. Myoview 10/28/14 was intermediate risk study due to reduced EF (42%), medium defect of moderate severity present in the basal anteroseptal, basal inferoseptal, mid anterior, apical anterior and apex location. This most likely represents breast attenuation vs. scar  Seen by Dr. Rayann Heman for rate control vs rhythm control. Plan made to lifestyle  modification including regular exercise and weight reduction strategies, she preferred rate control.   Seen by pulmonary 11/17/17 for persistent had a/throat/chest congestion and refractory shortness of breath and felt gerd tendency. Spirometry 10/07/2014 restrictive changes only.  Since then she is compliant with medication. She had made significant lifestyle changes. She states that intermittently she is having shortness of breath for the past few weeks that has gone worsened since Saturday 10/29. She is also complaining of left-sided sharp chest pain. No radiation of pain. She cannot lay flat for the past many months. She is compliant with CPAP. She is admitted to having bright red blood in her stool intermittently for the past 3 weeks. She had a nausea 2 days ago and vomiting this morning. Also having intermittent lower extremity edema and dry cough. The patient denies , fever,  palpitations, dizziness, syncope,  congestion, abdominal pain or Hematochezia.  In ED, CXR showed cardiomegaly with diffuse interstitial prominence, likely interstitial edema. BNP of 651.5. POC troponin negative. EKG showed afib at rate of 110 and PVCs. She was given IV lasix 40mg  x 1 and IV tordadol. Diuresed 1.4L so far. Currently her breathing has improved.  Problem List  Past Medical History  Diagnosis Date  . Vulvar cancer ([place])     invasive verrucous carcinoma of the vulva. History of genital warts and vulvar condyloma  . HPV (human papilloma virus) anogenital infection     03/21/12 PAP + HR HPV  . Abnormal Pap smear of vagina and vaginal HPV   . Hypertension   . COPD (chronic obstructive pulmonary disease) ([place])   .  Obesity   . Chronic anticoagulation     She is on Xarelto for Afib  . Atrial fibrillation ([place])   . Echocardiogram abnormal     Mitral regurgitation, tricuspic regurgitation  . Sleep apnea   . Urolithiasis   . Postoperative nausea and vomiting   . CHF (congestive heart failure) ([place])   .  Insulin resistance   . Hyperlipidemia   . History of stress test     Myoview 8/16: EF 42%, anteroseptal, inferoseptal, anterior, apical anterior and apical defect (likely represents breast attenuation versus scar), no ischemia; intermediate risk because of low EF    Past Surgical History  Procedure Laterality Date  . Vulvectomy Right 05/10/2014    Excisional biopsy of the superior right labial majora mass  . Abdominal hysterectomy    . Bilateral salpingoophorectomy  2003    benign ovarian cancer  . Lithotripsy    . Vulvectomy partial  07/02/2014    Re-excision and sentinel node dissection at [facility].      Allergies  No Known Allergies   Home Medications  Prior to Admission medications   Medication Sig Start Date End Date Taking? Authorizing Provider  acetaminophen (TYLENOL) 500 MG tablet Take 500 mg by mouth every 6 (six) hours as needed for mild pain.   Yes Historical Provider, MD  fluticasone (FLONASE) 50 MCG/ACT nasal spray Place 1 spray into both nostrils 2 (two) times daily as needed for allergies or rhinitis.   Yes Historical Provider, MD  furosemide (LASIX) 20 MG tablet Take 10 mg by mouth daily.    Yes Historical Provider, MD  irbesartan (AVAPRO) 75 MG tablet Take 37.5 mg by mouth daily.    Yes Historical Provider, MD  metoprolol (LOPRESSOR) 50 MG tablet Take 75 mg by mouth 2 (two) times daily.   Yes Historical Provider, MD  omeprazole (PRILOSEC) 20 MG capsule Take 20 mg by mouth daily before breakfast.   Yes Historical Provider, MD  rivaroxaban (XARELTO) 20 MG TABS tablet Take 20 mg by mouth daily with supper.  07/15/14  Yes Historical Provider, MD  albuterol (PROVENTIL HFA;VENTOLIN HFA) 108 (90 BASE) MCG/ACT inhaler Inhale 2 puffs into the lungs every 8 (eight) hours as needed for wheezing or shortness of breath. 11/27/14   Rigoberto Noel, MD    Family History  Family History  Problem Relation Age of Onset  . Prostate cancer Brother 90    still living and well  . Heart  attack Father   . Stroke Mother   . Diabetes Mother   . Hypertension Mother   . Heart attack Brother   . Healthy Sister    Family Status  Relation Status Death Age  . Brother Alive   . Father Deceased   . Mother Deceased   . Brother Alive   . Maternal Grandmother Deceased   . Maternal Grandfather Deceased   . Paternal Grandmother Deceased   . Paternal Grandfather Deceased   . Sister Alive      Social History  Social History   Social History  . Marital Status: Married    Spouse Name: N/A  . Number of Children: N/A  . Years of Education: N/A   Occupational History  . Not on file.   Social History Main Topics  . Smoking status: Former Smoker -- 1.00 packs/day for 50 years    Types: Cigarettes, E-cigarettes    Quit date: 03/22/2012  . Smokeless tobacco: Never Used  . Alcohol Use: No  . Drug Use: No  .  Sexual Activity: Not on file   Other Topics Concern  . Not on file   Social History Narrative     All other systems reviewed and are otherwise negative except as noted above.  Physical Exam  Blood pressure 147/106, pulse 98, resp. rate 28, height 5\' 1"  (1.549 m), weight 231 lb 8 oz (105.008 kg), SpO2 97 %.  General: Pleasant, obese woman in NAD Psych: Normal affect. Neuro: Alert and oriented X 3. Moves all extremities spontaneously. HEENT: Normal  Neck: Supple without bruits.  JVD difficult to assess due to girth.  Lungs:  Resp regular and unlabored, CTA. Heart: IR IR mildly tachycardic no s3, s4, or murmurs. Abdomen: Soft, non-tender, non-distended, BS + x 4. Obese Extremities: No clubbing, cyanosis. Trace BL LE edema L>R. DP/PT/Radials 2+ and equal bilaterally.  Labs  No results for input(s): CKTOTAL, CKMB, TROPONINI in the last 72 hours. Lab Results  Component Value Date   WBC 9.8 01/21/2015   HGB 16.0* 01/21/2015   HCT 47.0* 01/21/2015   MCV 95.4 01/21/2015   PLT 231 01/21/2015    Recent Labs Lab 01/21/15 1109 01/21/15 1125  NA 140 143  K  3.6 3.5  CL 105 103  CO2 25  --   BUN 16 20  CREATININE 0.96 0.90  CALCIUM 9.3  --   PROT 7.1  --   BILITOT 0.9  --   ALKPHOS 56  --   ALT 27  --   AST 25  --   GLUCOSE 169* 172*   Echo 09/25/14 LV EF: 55% -  60%  ------------------------------------------------------------------- Indications:   R06.02 Shortness of Breath.  ------------------------------------------------------------------- History:  PMH: Acquired from the patient and from the patient&'s chart. PMH: CHF. Atrial Fibrillation. COPD. Sleep Apnea. Risk factors: Hypertension. Obese. Dyslipidemia.  ------------------------------------------------------------------- Study Conclusions  - Left ventricle: The cavity size was normal. Wall thickness was normal. Systolic function was normal. The estimated ejection fraction was in the range of 55% to 60%. - Mitral valve: Calcified annulus. Mildly thickened leaflets . There was mild regurgitation. - Left atrium: The atrium was mildly dilated. - Atrial septum: No defect or patent foramen ovale was identified.    Radiology/Studies  Dg Chest 2 View  01/21/2015  CLINICAL DATA:  Shortness of breath for 2 months. Hypertension. Remote history of smoking. EXAM: CHEST  2 VIEW COMPARISON:  10/07/2014 FINDINGS: Cardiomegaly. Diffuse interstitial prominence throughout the lungs could reflect interstitial edema. No confluent opacity or effusion. No acute bony abnormality. IMPRESSION: Cardiomegaly with diffuse interstitial prominence, likely interstitial edema. Electronically Signed   By: Rolm Baptise M.D.   On: 01/21/2015 12:49   ECG  Vent. rate 110 BPM PR interval * ms QRS duration 101 ms QT/QTc 355/480 ms  ASSESSMENT AND PLAN   1. Acute on chronic diastolic CHF - Seems multifactorial - A. Fib/obesity/chornic diastolic CHF. - Chest x-ray shows cardiomegaly with diffuse interstitial edema. She was given IV Lasix 40 mg x 1. Diuresed 1.4L. her breathing has  improved. - She is taking Lasix 10 mg daily. Will place her on IV lasix 40mg  BID.  - Most recent echo Echo 09/25/14 demonstrated normal LVF (55-60%) with mild MR. Previously EF was 35%. - Myoview 10/28/14 was intermediate risk study due to reduced EF (42%), medium defect of moderate severity present in the basal anteroseptal, basal inferoseptal, mid anterior, apical anterior and apex location. This most likely represents breast attenuation vs. scar  2. Persistent A. Fib - She has failed medical therapy with Propafenone  and Amiodarone. -  Seen by Dr. Rayann Heman 12/09/14 for rate control vs rhythm control. Had discussion for tikosyn and ablation. Plan made to lifestyle modification including regular exercise and weight reduction strategies, she preferred rate control.  - Chads2vasc score is at least 4 (sex, age, HTN, CHF). She is on Xarelto for anticoagulation. Intermittently shs noticed blood in her stool. - Will get stool guaiac study. She denies history of hemorrhoids. Continue Xarelto for now. - Her rate in 90-100s. Will increase metoprolol to 100mg  BID. Resume home dose of ARB.   3. Obesity -Estimated body mass index is 43.76 kg/(m^2) as calculated from the following:   Height as of this encounter: 5\' 1"  (1.549 m).   Weight as of this encounter: 231 lb 8 oz (105.008 kg).  - She has made significant lifestyle modification and lost approximately 10 pounds.  4. OSA - On CPAP  5. HTN - Her BP is elevated, BP was 167/104 on admission - Continue BB (increase dose) and ACE   Signed, Bhagat,Bhavinkumar, PA-C 01/21/2015, 4:08 PM Pager [phone]  Personally seen and examined. Agree with above.  69 year old female with morbid obesity admitted with acute on chronic diastolic heart failure (last EF was normal on echocardiogram), former smoker, persistent atrial fibrillation with challenging rate control, failed cardioversions, obstructive sleep apnea.  Acute on chronic diastolic heart failure  -  Agree with above. We will go ahead and place on IV Lasix 40 mg twice a day. She has already had a good response to her IV Lasix. She is feeling more comfortable but still short of breath. Admission.   - Counsel on salt restriction, fluid restriction, diet and exercise.  Hypertensive urgency  - Blood pressure markedly elevated here in the emergency room. This should respond to IV diuresis as well as increase in metoprolol from 75 mg twice a day to 100 mg twice a day which will also improve rate control of her atrial fibrillation. We will also resume her angiotensin receptor blocker irbesartan at 37.5 mg once a day. This will likely need to be increased unless she has a dramatic response in blood pressure reduction with diuresis.  - Her markedly elevated blood pressure will result in significant afterload which may cause pulmonary edema hence shortness of breath.  Former smoker  - Several pack years.  - Prior evaluation by Dr. Melvyn Novas. Unremarkable.  Morbid obesity  - Certainly playing a role in her symptoms as well. Strongly encourage weight loss.  CPAP for obstructive sleep apnea.  Prior history of tachycardia mediated cardiomyopathy  -Prior EF 35% has improved.  Persistent atrial fibrillation  - Previously has been seen by Dr. Rayann Heman.  - Continue with rate control. Increase metoprolol to 100 mg twice a day  - Not a very good candidate for atrial fibrillation ablation or antiarrhythmic therapy, Tikosyn based upon her comorbidity of morbid obesity.  - Previously failed therapy with amiodarone.  - CHADS-Vasc -4   Bright red blood per rectum  - She was attributing this to possible hemorrhoid.  - She has had an episode twice. Not persistent.  - If necessary, contact GI for further evaluation.  - Continue Xarelto  Candee Furbish, MD

## 2015-01-21 NOTE — ED Provider Notes (Signed)
CSN: [id number]     Arrival date & time 01/21/15  1031 History   First MD Initiated Contact with Patient 01/21/15 1046     Chief Complaint  Patient presents with  . Congestive Heart Failure  . Blood In Stools    on a blood thinner     (Consider location/radiation/quality/duration/timing/severity/associated sxs/prior Treatment) HPI Comments: The patient is a 69 year old female, she has a history of atrial fibrillation, she is on anticoagulation with Xarelto, she reports a couple of episodes of blood in her stools over the last week. She also has a history of increased shortness of breath over the last several days. She does not wear oxygen at home, she denies being positive for COPD or any congestive heart failure or obstructive disease. She has been told in the past that she needed to lose weight and that her shortness of breath was related to her weight however she continues to have intermittent exacerbations of this shortness of breath. This has been prickly bad over the last 2 days, family members note that she is very short of breath appearing, much more than usual. She denies smoking cigarettes but was a former smoker.  Echo done in 09/2014 - showed normal EF, no signs of CHF  Patient is a 69 y.o. female presenting with CHF. The history is provided by the patient.  Congestive Heart Failure    Past Medical History  Diagnosis Date  . Vulvar cancer ([place])     invasive verrucous carcinoma of the vulva. History of genital warts and vulvar condyloma  . HPV (human papilloma virus) anogenital infection     03/21/12 PAP + HR HPV  . Abnormal Pap smear of vagina and vaginal HPV   . Hypertension   . COPD (chronic obstructive pulmonary disease) ([place])   . Obesity   . Chronic anticoagulation     She is on Xarelto for Afib  . Atrial fibrillation ([place])   . Echocardiogram abnormal     Mitral regurgitation, tricuspic regurgitation  . Sleep apnea   . Urolithiasis   . Postoperative nausea and  vomiting   . CHF (congestive heart failure) ([place])   . Insulin resistance   . Hyperlipidemia   . History of stress test     Myoview 8/16: EF 42%, anteroseptal, inferoseptal, anterior, apical anterior and apical defect (likely represents breast attenuation versus scar), no ischemia; intermediate risk because of low EF   Past Surgical History  Procedure Laterality Date  . Vulvectomy Right 05/10/2014    Excisional biopsy of the superior right labial majora mass  . Abdominal hysterectomy    . Bilateral salpingoophorectomy  2003    benign ovarian cancer  . Lithotripsy    . Vulvectomy partial  07/02/2014    Re-excision and sentinel node dissection at [facility].    Family History  Problem Relation Age of Onset  . Prostate cancer Brother 55    still living and well  . Heart attack Father   . Stroke Mother   . Diabetes Mother   . Hypertension Mother   . Heart attack Brother   . Healthy Sister    Social History  Substance Use Topics  . Smoking status: Former Smoker -- 1.00 packs/day for 50 years    Types: Cigarettes, E-cigarettes    Quit date: 03/22/2012  . Smokeless tobacco: Never Used  . Alcohol Use: No   OB History    No data available     Review of Systems  All other systems reviewed and  are negative.     Allergies  Review of patient's allergies indicates no known allergies.  Home Medications   Prior to Admission medications   Medication Sig Start Date End Date Taking? Authorizing Provider  acetaminophen (TYLENOL) 500 MG tablet Take 500 mg by mouth every 6 (six) hours as needed for mild pain.   Yes Historical Provider, MD  fluticasone (FLONASE) 50 MCG/ACT nasal spray Place 1 spray into both nostrils 2 (two) times daily as needed for allergies or rhinitis.   Yes Historical Provider, MD  furosemide (LASIX) 20 MG tablet Take 10 mg by mouth daily.    Yes Historical Provider, MD  irbesartan (AVAPRO) 75 MG tablet Take 37.5 mg by mouth daily.    Yes Historical Provider, MD   metoprolol (LOPRESSOR) 50 MG tablet Take 75 mg by mouth 2 (two) times daily.   Yes Historical Provider, MD  omeprazole (PRILOSEC) 20 MG capsule Take 20 mg by mouth daily before breakfast.   Yes Historical Provider, MD  rivaroxaban (XARELTO) 20 MG TABS tablet Take 20 mg by mouth daily with supper.  07/15/14  Yes Historical Provider, MD  albuterol (PROVENTIL HFA;VENTOLIN HFA) 108 (90 BASE) MCG/ACT inhaler Inhale 2 puffs into the lungs every 8 (eight) hours as needed for wheezing or shortness of breath. 11/27/14   Rigoberto Noel, MD   BP 122/93 mmHg  Pulse 82  Temp(Src) 97.7 F (36.5 C) (Oral)  Resp 20  Ht 5\' 1"  (1.549 m)  Wt 227 lb 3.2 oz (103.057 kg)  BMI 42.95 kg/m2  SpO2 98% Physical Exam  Constitutional: She appears well-developed and well-nourished. No distress.  HENT:  Head: Normocephalic and atraumatic.  Mouth/Throat: Oropharynx is clear and moist. No oropharyngeal exudate.  Eyes: Conjunctivae and EOM are normal. Pupils are equal, round, and reactive to light. Right eye exhibits no discharge. Left eye exhibits no discharge. No scleral icterus.  Neck: Normal range of motion. Neck supple. No JVD present. No thyromegaly present.  Cardiovascular: Normal heart sounds and intact distal pulses.  Exam reveals no gallop and no friction rub.   No murmur heard. Tachycardia, afib  Pulmonary/Chest: She is in respiratory distress. She has no wheezes. She has no rales.  Tachypnea, no rales or wheezing  Abdominal: Soft. Bowel sounds are normal. She exhibits no distension and no mass. There is no tenderness.  Genitourinary:  Chaperone present for exam - normal appearing anus - normal tone, no masses, no fissures, no hemorrhoids, no ttp, no blood, brown / yellow stool  Musculoskeletal: Normal range of motion. She exhibits edema ( scant edema, symmetrical). She exhibits no tenderness.  Lymphadenopathy:    She has no cervical adenopathy.  Neurological: She is alert. Coordination normal.  Skin: Skin  is warm and dry. No rash noted. No erythema.  Psychiatric: She has a normal mood and affect. Her behavior is normal.  Nursing note and vitals reviewed.   ED Course  Procedures (including critical care time) Labs Review Labs Reviewed  COMPREHENSIVE METABOLIC PANEL - Abnormal; Notable for the following:    Glucose, Bld 169 (*)    GFR calc non Af Amer 58 (*)    All other components within normal limits  PROTIME-INR - Abnormal; Notable for the following:    Prothrombin Time 21.3 (*)    INR 1.85 (*)    All other components within normal limits  BRAIN NATRIURETIC PEPTIDE - Abnormal; Notable for the following:    B Natriuretic Peptide 651.5 (*)    All other components within  normal limits  BASIC METABOLIC PANEL - Abnormal; Notable for the following:    Potassium 3.1 (*)    Chloride 100 (*)    Glucose, Bld 100 (*)    Creatinine, Ser 1.10 (*)    GFR calc non Af Amer 49 (*)    GFR calc Af Amer 57 (*)    All other components within normal limits  LIPID PANEL - Abnormal; Notable for the following:    HDL 27 (*)    All other components within normal limits  I-STAT CHEM 8, ED - Abnormal; Notable for the following:    Glucose, Bld 172 (*)    Hemoglobin 16.0 (*)    HCT 47.0 (*)    All other components within normal limits  CBC WITH DIFFERENTIAL/PLATELET  APTT  VITAMIN B12  FOLATE  IRON AND TIBC  FERRITIN  RETICULOCYTES  TSH  CBC  OCCULT BLOOD X 1 CARD TO LAB, STOOL  I-STAT TROPOININ, ED  POC OCCULT BLOOD, ED  TYPE AND SCREEN  ABO/RH    Imaging Review Dg Chest 2 View  01/21/2015  CLINICAL DATA:  Shortness of breath for 2 months. Hypertension. Remote history of smoking. EXAM: CHEST  2 VIEW COMPARISON:  10/07/2014 FINDINGS: Cardiomegaly. Diffuse interstitial prominence throughout the lungs could reflect interstitial edema. No confluent opacity or effusion. No acute bony abnormality. IMPRESSION: Cardiomegaly with diffuse interstitial prominence, likely interstitial edema.  Electronically Signed   By: Rolm Baptise M.D.   On: 01/21/2015 12:49   I have personally reviewed and evaluated these images and lab results as part of my medical decision-making.   EKG Interpretation   Date/Time:  Tuesday January 21 2015 10:50:58 EDT Ventricular Rate:  110 PR Interval:    QRS Duration: 101 QT Interval:  355 QTC Calculation: 480 R Axis:   67 Text Interpretation:  Atrial fibrillation Paired ventricular premature  complexes Borderline repolarization abnormality Baseline wander in lead(s)  II III aVF V1 since last tracing no significant change Confirmed by Eddith Mentor   MD, [place] Depaz ([id number]) on 01/21/2015 11:16:46 AM      MDM   Final diagnoses:  Acute systolic congestive heart failure (HCC)    Respiratory distress, she is deathly tachypnea, she is tachycardic and an atrophic relation though her rate is between 101 110. Abdomen is nontender, lungs are clear, check a chest x-ray, workup for GI bleed as well. Patient is in agreement with the plan.  The pt has ongoing dyspnea and tachypnea  - I have personally viewed and interpreted the imaging and agree with radiologist interpretation.She had interstitial edema c/w CHF - BNP > 400, ECG without ischemia, trop neg, labs otherwise unremarkable, lasix ordered.  No gross blood in stool - has no anemia.  Will d/w Cardiology who will admit  Noemi Chapel, MD 01/22/15 [phone]

## 2015-01-22 DIAGNOSIS — Z9229 Personal history of other drug therapy: Secondary | ICD-10-CM | POA: Diagnosis not present

## 2015-01-22 DIAGNOSIS — I5033 Acute on chronic diastolic (congestive) heart failure: Secondary | ICD-10-CM | POA: Diagnosis not present

## 2015-01-22 DIAGNOSIS — I481 Persistent atrial fibrillation: Secondary | ICD-10-CM | POA: Diagnosis not present

## 2015-01-22 DIAGNOSIS — K921 Melena: Secondary | ICD-10-CM | POA: Diagnosis not present

## 2015-01-22 LAB — TSH: TSH: 0.917 u[IU]/mL (ref 0.350–4.500)

## 2015-01-22 LAB — LIPID PANEL
Cholesterol: 118 mg/dL (ref 0–200)
HDL: 27 mg/dL — AB (ref >40)
LDL CALC: 70 mg/dL (ref 0–99)
TRIGLYCERIDES: 103 mg/dL (ref <150)
Total CHOL/HDL Ratio: 4.4 RATIO
VLDL: 21 mg/dL (ref 0–40)

## 2015-01-22 LAB — CBC
HEMATOCRIT: 38.2 % (ref 36.0–46.0)
HEMOGLOBIN: 12.2 g/dL (ref 12.0–15.0)
MCH: 30.5 pg (ref 26.0–34.0)
MCHC: 31.9 g/dL (ref 30.0–36.0)
MCV: 95.5 fL (ref 78.0–100.0)
Platelets: 191 10*3/uL (ref 150–400)
RBC: 4 MIL/uL (ref 3.87–5.11)
RDW: 13 % (ref 11.5–15.5)
WBC: 8.7 10*3/uL (ref 4.0–10.5)

## 2015-01-22 LAB — BASIC METABOLIC PANEL
ANION GAP: 7 (ref 5–15)
BUN: 17 mg/dL (ref 6–20)
CHLORIDE: 100 mmol/L — AB (ref 101–111)
CO2: 32 mmol/L (ref 22–32)
Calcium: 9.1 mg/dL (ref 8.9–10.3)
Creatinine, Ser: 1.1 mg/dL — ABNORMAL HIGH (ref 0.44–1.00)
GFR calc non Af Amer: 49 mL/min — ABNORMAL LOW (ref >60)
GFR, EST AFRICAN AMERICAN: 57 mL/min — AB (ref >60)
GLUCOSE: 100 mg/dL — AB (ref 65–99)
POTASSIUM: 3.1 mmol/L — AB (ref 3.5–5.1)
Sodium: 139 mmol/L (ref 135–145)

## 2015-01-22 MED ORDER — POTASSIUM CHLORIDE CRYS ER 20 MEQ PO TBCR
40.0000 meq | EXTENDED_RELEASE_TABLET | Freq: Three times a day (TID) | ORAL | Status: AC
  Administered 2015-01-22 – 2015-01-23 (×6): 40 meq via ORAL
  Filled 2015-01-22 (×5): qty 2

## 2015-01-22 NOTE — Progress Notes (Signed)
    Subjective:  Feeling better. No signif SOB. No CP.  Out 3 liters  Objective:  Vital Signs in the last 24 hours: Temp:  [97.7 F (36.5 C)-97.9 F (36.6 C)] 97.7 F (36.5 C) (11/02 0900) Pulse Rate:  [59-146] 93 (11/02 0900) Resp:  [13-31] 20 (11/02 0900) BP: (106-172)/(64-109) 106/65 mmHg (11/02 0900) SpO2:  [96 %-100 %] 96 % (11/02 0932) Weight:  [227 lb 3.2 oz (103.057 kg)-231 lb 8 oz (105.008 kg)] 227 lb 3.2 oz (103.057 kg) (11/02 0502)  Intake/Output from previous day: 11/01 0701 - 11/02 0700 In: -  Out: 2350 [Urine:2350]   Physical Exam: General: Well developed, well nourished, in no acute distress. Head:  Normocephalic and atraumatic. Lungs: Clear to auscultation and percussion. Heart: irreg normal rate.  No murmur, rubs or gallops.  Abdomen: soft, non-tender, positive bowel sounds. obese Extremities: No clubbing or cyanosis. No edema. Neurologic: Alert and oriented x 3.    Lab Results:  Recent Labs  01/21/15 1109 01/21/15 1125 01/22/15 0455  WBC 9.8  --  8.7  HGB 13.5 16.0* 12.2  PLT 231  --  191    Recent Labs  01/21/15 1109 01/21/15 1125 01/22/15 0455  NA 140 143 139  K 3.6 3.5 3.1*  CL 105 103 100*  CO2 25  --  32  GLUCOSE 169* 172* 100*  BUN 16 20 17   CREATININE 0.96 0.90 1.10*   No results for input(s): TROPONINI in the last 72 hours.  Invalid input(s): CK, MB Hepatic Function Panel  Recent Labs  01/21/15 1109  PROT 7.1  ALBUMIN 3.6  AST 25  ALT 27  ALKPHOS 56  BILITOT 0.9    Recent Labs  01/22/15 0455  CHOL 118   No results for input(s): PROTIME in the last 72 hours.  Imaging: Dg Chest 2 View  01/21/2015  CLINICAL DATA:  Shortness of breath for 2 months. Hypertension. Remote history of smoking. EXAM: CHEST  2 VIEW COMPARISON:  10/07/2014 FINDINGS: Cardiomegaly. Diffuse interstitial prominence throughout the lungs could reflect interstitial edema. No confluent opacity or effusion. No acute bony abnormality. IMPRESSION:  Cardiomegaly with diffuse interstitial prominence, likely interstitial edema. Electronically Signed   By: Rolm Baptise M.D.   On: 01/21/2015 12:49   Personally viewed.   Telemetry: AFIB better rate control 80-100 Personally viewed.   Cardiac Studies:  ECHO p  Scheduled Meds: . aspirin EC  81 mg Oral Daily  . furosemide  40 mg Intravenous BID  . irbesartan  37.5 mg Oral Daily  . metoprolol  100 mg Oral BID  . pantoprazole  40 mg Oral Daily  . potassium chloride  40 mEq Oral TID  . rivaroxaban  20 mg Oral Q supper   Continuous Infusions:  PRN Meds:.acetaminophen, albuterol, fluticasone, nitroGLYCERIN, ondansetron (ZOFRAN) IV   Assessment/Plan:  Principal Problem:   Acute on chronic diastolic heart failure (HCC) Active Problems:   History of anticoagulant therapy   Chronic atrial fibrillation (HCC)   OSA (obstructive sleep apnea)   Hypertensive urgency   Morbid obesity (HCC)   Blood in stool   Acute diastolic CHF (congestive heart failure) ([place])   - continue with diuresis  - replete KCL  - BP improved with diuresis  - encourage weight loss  - much improved  - continue xarelto. Will stop ASA 81  - AFIB better controlled with increased metoprolol.   - Hemeocult neg.      SKAINS, [place] 01/22/2015, 12:18 PM

## 2015-01-23 ENCOUNTER — Encounter (HOSPITAL_COMMUNITY): Payer: Self-pay | Admitting: General Practice

## 2015-01-23 DIAGNOSIS — I482 Chronic atrial fibrillation: Secondary | ICD-10-CM | POA: Diagnosis not present

## 2015-01-23 DIAGNOSIS — I160 Hypertensive urgency: Secondary | ICD-10-CM | POA: Diagnosis not present

## 2015-01-23 DIAGNOSIS — Z9229 Personal history of other drug therapy: Secondary | ICD-10-CM | POA: Diagnosis not present

## 2015-01-23 DIAGNOSIS — I5033 Acute on chronic diastolic (congestive) heart failure: Secondary | ICD-10-CM | POA: Diagnosis not present

## 2015-01-23 LAB — BASIC METABOLIC PANEL
ANION GAP: 7 (ref 5–15)
BUN: 18 mg/dL (ref 6–20)
CHLORIDE: 99 mmol/L — AB (ref 101–111)
CO2: 32 mmol/L (ref 22–32)
Calcium: 9.5 mg/dL (ref 8.9–10.3)
Creatinine, Ser: 1.09 mg/dL — ABNORMAL HIGH (ref 0.44–1.00)
GFR calc non Af Amer: 50 mL/min — ABNORMAL LOW (ref >60)
GFR, EST AFRICAN AMERICAN: 58 mL/min — AB (ref >60)
GLUCOSE: 142 mg/dL — AB (ref 65–99)
Potassium: 5 mmol/L (ref 3.5–5.1)
Sodium: 138 mmol/L (ref 135–145)

## 2015-01-23 MED ORDER — PNEUMOCOCCAL VAC POLYVALENT 25 MCG/0.5ML IJ INJ
0.5000 mL | INJECTION | INTRAMUSCULAR | Status: AC
  Administered 2015-01-24: 0.5 mL via INTRAMUSCULAR
  Filled 2015-01-23: qty 0.5

## 2015-01-23 MED ORDER — DOCUSATE SODIUM 100 MG PO CAPS
100.0000 mg | ORAL_CAPSULE | Freq: Two times a day (BID) | ORAL | Status: DC
  Administered 2015-01-23 – 2015-01-24 (×3): 100 mg via ORAL
  Filled 2015-01-23 (×3): qty 1

## 2015-01-23 MED ORDER — INFLUENZA VAC SPLIT QUAD 0.5 ML IM SUSY
0.5000 mL | PREFILLED_SYRINGE | INTRAMUSCULAR | Status: AC
  Administered 2015-01-24: 0.5 mL via INTRAMUSCULAR

## 2015-01-23 NOTE — Progress Notes (Signed)
Patient Name: Holly Smith Date of Encounter: 01/23/2015  Principal Problem:   Acute on chronic diastolic heart failure ([place]) Active Problems:   History of anticoagulant therapy   Chronic atrial fibrillation (HCC)   OSA (obstructive sleep apnea)   Hypertensive urgency   Morbid obesity ([place])   Blood in stool   Acute diastolic CHF (congestive heart failure) [facility])   Primary Cardiologist: Dr Irish Lack  Patient Profile: 69 y.o. female with a hx of probable tachycardia mediated cardiomyopathy, systolic HF and persistent/chronic AFib s/p prior DCCV, HTN, HL, OSA on CPAP, Admitted 11/01 w/ SOB.   SUBJECTIVE: Breating is at baseline. Woke up in the middle of the night sweating and was nauseated this am, dry heaves. Ate a small amount which stayed down. Still queasy,   OBJECTIVE Filed Vitals:   01/22/15 0932 01/22/15 1947 01/23/15 0449 01/23/15 1010  BP:  114/79 130/91 137/78  Pulse:  95 103 101  Temp:  97.8 F (36.6 C) 97.6 F (36.4 C)   TempSrc:  Oral Oral   Resp:  20 20   Height:      Weight:   226 lb 14.4 oz (102.921 kg)   SpO2: 96% 93% 97%     Intake/Output Summary (Last 24 hours) at 01/23/15 1227 Last data filed at 01/23/15 1207  Gross per 24 hour  Intake   1080 ml  Output   3900 ml  Net  -2820 ml   Filed Weights   01/21/15 1841 01/22/15 0502 01/23/15 0449  Weight: 227 lb 3.2 oz (103.057 kg) 227 lb 3.2 oz (103.057 kg) 226 lb 14.4 oz (102.921 kg)    PHYSICAL EXAM General: Well developed, well nourished, female in no acute distress. Head: Normocephalic, atraumatic.  Neck: Supple without bruits, JVD not elevated. Lungs:  Resp regular and unlabored, decreased BS bases. Heart: Irreg irreg, S1, S2, no S3, S4, or murmur; no rub. Abdomen: Soft, non-tender, non-distended, BS + x 4.  Extremities: No clubbing, cyanosis, edema.  Neuro: Alert and oriented X 3. Moves all extremities spontaneously. Psych: Normal affect.  LABS: CBC: Recent Labs  01/21/15 1109  01/21/15 1125 01/22/15 0455  WBC 9.8  --  8.7  NEUTROABS 7.6  --   --   HGB 13.5 16.0* 12.2  HCT 41.8 47.0* 38.2  MCV 95.4  --  95.5  PLT 231  --  191   INR: Recent Labs  01/21/15 1109  INR 1.61*   Basic Metabolic Panel: Recent Labs  01/21/15 1109 01/21/15 1125 01/22/15 0455  NA 140 143 139  K 3.6 3.5 3.1*  CL 105 103 100*  CO2 25  --  32  GLUCOSE 169* 172* 100*  BUN 16 20 17   CREATININE 0.96 0.90 1.10*  CALCIUM 9.3  --  9.1   Liver Function Tests: Recent Labs  01/21/15 1109  AST 25  ALT 27  ALKPHOS 56  BILITOT 0.9  PROT 7.1  ALBUMIN 3.6    Recent Labs  01/21/15 1124  TROPIPOC 0.01   BNP:  B NATRIURETIC PEPTIDE  Date/Time Value Ref Range Status  01/21/2015 11:09 AM 651.5* 0.0 - 100.0 pg/mL Final   Fasting Lipid Panel: Recent Labs  01/22/15 0455  CHOL 118  HDL 27*  LDLCALC 70  TRIG 103  CHOLHDL 4.4   Thyroid Function Tests: Recent Labs  01/22/15 0455  TSH 0.917   Anemia Panel: Recent Labs  01/21/15 1109 01/21/15 1145  VITAMINB12 362  --   FOLATE  --  17.1  FERRITIN 97  --   TIBC 435  --   IRON 99  --   RETICCTPCT 2.2  --     TELE:  Atrial fib, rate generally OK, occ PVCs      Radiology/Studies: Dg Chest 2 View  01/21/2015  CLINICAL DATA:  Shortness of breath for 2 months. Hypertension. Remote history of smoking. EXAM: CHEST  2 VIEW COMPARISON:  10/07/2014 FINDINGS: Cardiomegaly. Diffuse interstitial prominence throughout the lungs could reflect interstitial edema. No confluent opacity or effusion. No acute bony abnormality. IMPRESSION: Cardiomegaly with diffuse interstitial prominence, likely interstitial edema. Electronically Signed   By: Rolm Baptise M.D.   On: 01/21/2015 12:49   Current Medications:  . furosemide  40 mg Intravenous BID  . irbesartan  37.5 mg Oral Daily  . metoprolol  100 mg Oral BID  . pantoprazole  40 mg Oral Daily  . potassium chloride  40 mEq Oral TID  . rivaroxaban  20 mg Oral Q supper       ASSESSMENT AND PLAN: Principal Problem:   Acute on chronic diastolic heart failure (HCC) - weight is at baseline, encourage increased activity and keeping Lasix IV one more day. PO tomorrow.  - cannot walk very much on a good day - make sure she is aggressive about managing sodium  Active Problems:   History of anticoagulant therapy - continue Xarelto    Chronic atrial fibrillation (HCC) - rate generally OK, continue BB    OSA (obstructive sleep apnea) - CPAP    Hypertensive urgency - improved w/ diuresis    Morbid obesity (HCC) - encourage dietary health    Blood in stool - no sig anemia, likely hemorrhoids    Nausea, dry heaves - possible constipation - add stool softeners, follow    Hypokalemia - s/p supplement - recheck  Plan: increase activity, d/c when medically stable.  Jonetta Speak , PA-C 12:27 PM 01/23/2015  Personally seen and examined. Agree with above. Nausea mildly improved Cramping (mustard) IV lasix one more day Possible DC tomorrow.   Candee Furbish, MD

## 2015-01-23 NOTE — Care Management Obs Status (Signed)
[place] NOTIFICATION   Patient Details  Name: Holly Smith MRN: [id number] Date of Birth: Jul 17, 1945   Medicare Observation Status Notification Given:  Yes    Royston Bake, RN 01/23/2015, 12:17 PM

## 2015-01-23 NOTE — Progress Notes (Signed)
Heart Failure Navigator Consult Note  Presentation:  Holly Smith is a 69 y.o. female with a hx of probable tachycardia mediated cardiomyopathy, systolic HF and persistent/chronic AFib s/p prior DCCV, HTN, HL, OSA on CPAP whopresented to [facility] ED for evaluation worsening sob.   She went to the hospital for preoperative evaluation for a GYN surgery. ECG demonstrated atrial fibrillation at that time. She was in the hospital at [facility] in 06/2013 and underwent cardioversion with restoration of normal sinus rhythm. Her EF was 35% at that time. She developed acute systolic heart failure in the hospital and required IV diuresis. She was then placed on IV amiodarone due to rapid rate and converted to normal sinus rhythm. She remained on amiodarone for at least 2-3 months after this. Follow-up notes indicate she was back in atrial fibrillation by June 2015. She has failed medical therapy with Propafenone and Amiodarone. She has seen Dr. Josefa Half at [facility] in the past. She declined PVI ablation in the past.   She established with Dr. Casandra Doffing 09/16/14 and noted symptoms of DOE. He placed her on low dose Lasix 3 x a week. It was felt that if her symptoms do not improve with diuresis, she will need pursue a strategy of rhythm control for her AFib. FU echo 09/25/14 demonstrated normal LVF (55-60%) with mild MR.   Seen by Richardson Dopp in clinic 10/24/14 and advised to take lasix po 10mg  daily. Myoview 10/28/14 was intermediate risk study due to reduced EF (42%), medium defect of moderate severity present in the basal anteroseptal, basal inferoseptal, mid anterior, apical anterior and apex location. This most likely represents breast attenuation vs. scar  Seen by Dr. Rayann Heman for rate control vs rhythm control. Plan made to lifestyle modification including regular exercise and weight reduction strategies, she preferred rate control.   Seen by pulmonary 11/17/17 for persistent had a/throat/chest congestion and  refractory shortness of breath and felt gerd tendency. Spirometry 10/07/2014 restrictive changes only.  Since then she is compliant with medication. She had made significant lifestyle changes. She states that intermittently she is having shortness of breath for the past few weeks that has gone worsened since Saturday 10/29. She is also complaining of left-sided sharp chest pain. No radiation of pain. She cannot lay flat for the past many months. She is compliant with CPAP. She is admitted to having bright red blood in her stool intermittently for the past 3 weeks. She had a nausea 2 days ago and vomiting this morning. Also having intermittent lower extremity edema and dry cough. The patient denies , fever, palpitations, dizziness, syncope, congestion, abdominal pain or Hematochezia.  In ED, CXR showed cardiomegaly with diffuse interstitial prominence, likely interstitial edema. BNP of 651.5. POC troponin negative. EKG showed afib at rate of 110 and PVCs. She was given IV lasix 40mg  x 1 and IV tordadol. Diuresed 1.4L so far. Currently her breathing has improved.   Past Medical History  Diagnosis Date  . Vulvar cancer ([place])     invasive verrucous carcinoma of the vulva. History of genital warts and vulvar condyloma  . HPV (human papilloma virus) anogenital infection     03/21/12 PAP + HR HPV  . Abnormal Pap smear of vagina and vaginal HPV   . Hypertension   . COPD (chronic obstructive pulmonary disease) ([place])   . Obesity   . Chronic anticoagulation     She is on Xarelto for Afib  . Atrial fibrillation ([place])   . Echocardiogram abnormal  Mitral regurgitation, tricuspic regurgitation  . Sleep apnea   . Urolithiasis   . Postoperative nausea and vomiting   . CHF (congestive heart failure) ([place])   . Insulin resistance   . Hyperlipidemia   . History of stress test     Myoview 8/16: EF 42%, anteroseptal, inferoseptal, anterior, apical anterior and apical defect (likely represents breast  attenuation versus scar), no ischemia; intermediate risk because of low EF    Social History   Social History  . Marital Status: Married    Spouse Name: N/A  . Number of Children: N/A  . Years of Education: N/A   Social History Main Topics  . Smoking status: Former Smoker -- 1.00 packs/day for 50 years    Types: Cigarettes, E-cigarettes    Quit date: 03/22/2012  . Smokeless tobacco: Never Used  . Alcohol Use: No  . Drug Use: No  . Sexual Activity: Not Asked   Other Topics Concern  . None   Social History Narrative    ECHO:Study Conclusions-- 09/25/14  - Left ventricle: The cavity size was normal. Wall thickness was normal. Systolic function was normal. The estimated ejection fraction was in the range of 55% to 60%. - Mitral valve: Calcified annulus. Mildly thickened leaflets . There was mild regurgitation. - Left atrium: The atrium was mildly dilated. - Atrial septum: No defect or patent foramen ovale was identified.  Transthoracic echocardiography. M-mode, complete 2D, spectral Doppler, and color Doppler. Birthdate: Patient birthdate: 1945-05-11. Age: Patient is 69 yr old. Sex: Gender: female. BMI: 43.5 kg/m^2. Blood pressure:   118/82 Patient status: Outpatient. Study date: Study date: 09/25/2014. Study time: 04:05 PM. Location: [place] [facility]  BNP    Component Value Date/Time   BNP 651.5* 01/21/2015 1109    ProBNP    Component Value Date/Time   PROBNP 119.0* 09/16/2014 1252     Education Assessment and Provision:  Detailed education and instructions provided on heart failure disease management including the following:  Signs and symptoms of Heart Failure When to call the physician Importance of daily weights Low sodium diet Fluid restriction Medication management Anticipated future follow-up appointments  Patient education given on each of the above topics.  Patient acknowledges understanding and acceptance of all  instructions.  I spoke with Ms. Eddington regarding her HF.  She has a scale at home and weighs each day.  I emphasized the importance of daily weights and how they relate to the signs and symptoms of HF.  She does say that she had recent weight gains and "let it go" too far before contacting the physician.  She also admits that she has been eating high sodium--"wrong foods" because she was on "Atkins Diet".  She has been eating pork rinds and mostly meats.  I have encouraged her to pay much closer attention to the sodium in her food choices /reviewed a low sodium diet and high sodium foods to avoid.  She denies any issues with getting or taking prescribed medications.  She follows with Dr Marlou Porch as an outpatient.  Education Materials:  "Living Better With Heart Failure" Booklet, Daily Weight Tracker Tool   High Risk Criteria for Readmission and/or Poor Patient Outcomes:   EF <30%- yes 55-60%  2 or more admissions in 6 months- No  Difficult social situation- No-lives alone however has a son and daughter-in law that live here in [place].  Demonstrates medication noncompliance-denies- No   Barriers of Care:  Knowledge and compliance of HF.  Discharge Planning:  Plans to discharge to home alone to [place], [place].

## 2015-01-24 ENCOUNTER — Encounter (HOSPITAL_COMMUNITY): Payer: Self-pay | Admitting: *Deleted

## 2015-01-24 DIAGNOSIS — I5033 Acute on chronic diastolic (congestive) heart failure: Secondary | ICD-10-CM | POA: Diagnosis not present

## 2015-01-24 DIAGNOSIS — I160 Hypertensive urgency: Secondary | ICD-10-CM | POA: Diagnosis not present

## 2015-01-24 DIAGNOSIS — I482 Chronic atrial fibrillation: Secondary | ICD-10-CM | POA: Diagnosis not present

## 2015-01-24 LAB — BASIC METABOLIC PANEL
ANION GAP: 10 (ref 5–15)
BUN: 20 mg/dL (ref 6–20)
CALCIUM: 9.3 mg/dL (ref 8.9–10.3)
CO2: 28 mmol/L (ref 22–32)
Chloride: 101 mmol/L (ref 101–111)
Creatinine, Ser: 1.2 mg/dL — ABNORMAL HIGH (ref 0.44–1.00)
GFR calc Af Amer: 51 mL/min — ABNORMAL LOW (ref >60)
GFR, EST NON AFRICAN AMERICAN: 44 mL/min — AB (ref >60)
GLUCOSE: 103 mg/dL — AB (ref 65–99)
POTASSIUM: 4.2 mmol/L (ref 3.5–5.1)
Sodium: 139 mmol/L (ref 135–145)

## 2015-01-24 MED ORDER — FUROSEMIDE 40 MG PO TABS: 40.0000 mg | ORAL_TABLET | Freq: Every day | ORAL | Status: DC

## 2015-01-24 MED ORDER — METOPROLOL TARTRATE 100 MG PO TABS: 100.0000 mg | ORAL_TABLET | Freq: Two times a day (BID) | ORAL | Status: DC

## 2015-01-24 NOTE — Care Management Note (Signed)
Case Management Note  Patient Details  Name: Holly Smith MRN: [id number] Date of Birth: January 09, 1946  Subjective/Objective:     Admitted with Heart Failure               Action/Plan: Patient lives alone, continues to work; PCP is Dr Nelda Bucks; has private insurance with [employer] with prescription drug coverage and does not have any problem getting her medication; pharmacy of choice is [facility]. No needs identified. CM will continue to follow for DCP.  Expected Discharge Date:    possibly 01/24/2015              Expected Discharge Plan:  Home/Self Care  Discharge planning Services  CM Consult  Status of Service:  In process, will continue to follow  Royston Bake [place] ,[place] [phone]  01/24/2015, 10:47 AM

## 2015-01-24 NOTE — Progress Notes (Signed)
IV removed per discharge order. Discharge instructions given and explained to patient with teach back. Discharged via wheelchair to daughter's care with Student Nurse present.

## 2015-01-24 NOTE — Discharge Summary (Signed)
Physician Discharge Summary     Cardiologist: Varanasi/Allred Patient ID: Holly Smith MRN: [id number] DOB/AGE: Feb 07, 1946 69 y.o.  Admit date: 01/21/2015 Discharge date: 01/24/2015  Admission Diagnoses: Acute on chronic diastolic heart failure  Discharge Diagnoses:  Principal Problem:   Acute on chronic diastolic heart failure (HCC) Active Problems:   History of anticoagulant therapy   Chronic atrial fibrillation (HCC)   OSA (obstructive sleep apnea)   Hypertensive urgency   Morbid obesity (HCC)   Blood in stool   Acute diastolic CHF (congestive heart failure) ([place])   Discharged Condition: stable  Hospital Course:   Holly Smith is a 69 y.o. female with a hx of probable tachycardia mediated cardiomyopathy, systolic HF and persistent/chronic AFib s/p prior DCCV, HTN, HL, OSA on CPAP whopresented to [facility] ED for evaluation worsening sob.   She went to the hospital for preoperative evaluation for a GYN surgery. ECG demonstrated atrial fibrillation at that time. She was in the hospital at [facility] in 06/2013 and underwent cardioversion with restoration of normal sinus rhythm. Her EF was 35% at that time. She developed acute systolic heart failure in the hospital and required IV diuresis. She was then placed on IV amiodarone due to rapid rate and converted to normal sinus rhythm. She remained on amiodarone for at least 2-3 months after this. Follow-up notes indicate she was back in atrial fibrillation by June 2015. She has failed medical therapy with Propafenone and Amiodarone. She has seen Dr. Josefa Half at [facility] in the past. She declined PVI ablation in the past.   She established with Dr. Casandra Doffing 09/16/14 and noted symptoms of DOE. He placed her on low dose Lasix 3 x a week. FU echo 09/25/14 demonstrated normal LVF (55-60%) with mild MR.   Seen by Richardson Dopp in clinic 10/24/14 and advised to take lasix po 10mg  daily. Myoview 10/28/14 was intermediate risk study due to  reduced EF (42%), medium defect of moderate severity present in the basal anteroseptal, basal inferoseptal, mid anterior, apical anterior and apex location. This most likely represents breast attenuation vs. scar  Seen by Dr. Rayann Heman for rate control vs rhythm control. Plan made to lifestyle modification including regular exercise and weight reduction strategies, she preferred rate control.   Seen by pulmonary 11/17/17 for persistent had a/throat/chest congestion and refractory shortness of breath and felt gerd tendency. Spirometry 10/07/2014 restrictive changes only.  Since then she is compliant with medication. She had made significant lifestyle changes. She states that intermittently she is having shortness of breath for the past few weeks that has gone worsened since Saturday 10/29. She is also complaining of left-sided sharp chest pain. No radiation of pain. She cannot lay flat for the past many months. She is compliant with CPAP. She is admitted to having bright red blood in her stool intermittently for the past 3 weeks. She had a nausea 2 days ago and vomiting this morning. Also having intermittent lower extremity edema and dry cough. The patient denies , fever, palpitations, dizziness, syncope, congestion, abdominal pain or Hematochezia.  In ED, CXR showed cardiomegaly with diffuse interstitial prominence, likely interstitial edema. BNP of 651.5. POC troponin negative. EKG showed afib at rate of 110 and PVCs. She was given IV lasix 40mg  x 1 and IV tordadol. Diuresed 1.4L so far. Currently her breathing has improved.  She was admitted with acute on chronic diastolic heart failure. This was thought to be old defect arterial in relation to her A. fib obesity and chronic diastolic heart  failure. She was started on IV Lasix 40 mg twice daily. Chads2vasc score is at least 4 (sex, age, HTN, CHF).  She was continued on Xarelto. Hemoglobin remained stable. Metoprolol was increased from 75-100 mg twice daily to  help rate control as well as blood pressure. Irbesartan 37.5 mg was continued. Blood pressure improved considerably. She had excellent response to diuresis with 7 L out.  We switched her to 40 mg once daily Lasix.  Low-carb diet was recommended for her obesity.  The patient was seen by Dr. Marlou Porch who felt she was stable for DC home.   Consults: None  Significant Diagnostic Studies:  CHEST 2 VIEW  COMPARISON: 10/07/2014  FINDINGS: Cardiomegaly. Diffuse interstitial prominence throughout the lungs could reflect interstitial edema. No confluent opacity or effusion. No acute bony abnormality.  IMPRESSION: Cardiomegaly with diffuse interstitial prominence, likely interstitial edema.  Treatments: See above  Discharge Exam: Blood pressure 129/75, pulse 98, temperature 98.1 F (36.7 C), temperature source Oral, resp. rate 20, height 5\' 1"  (1.549 m), weight 223 lb 3.8 oz (101.261 kg), SpO2 100 %.   Disposition:       Discharge Instructions    Diet - low sodium heart healthy    Complete by:  As directed      Discharge instructions    Complete by:  As directed   Monitor your weight every morning.  If you gain 3 pounds in 24 hours, or 5 pounds in a week, call the office for instructions.     Increase activity slowly    Complete by:  As directed             Medication List    TAKE these medications        acetaminophen 500 MG tablet  Commonly known as:  TYLENOL  Take 500 mg by mouth every 6 (six) hours as needed for mild pain.     albuterol 108 (90 BASE) MCG/ACT inhaler  Commonly known as:  PROVENTIL HFA;VENTOLIN HFA  Inhale 2 puffs into the lungs every 8 (eight) hours as needed for wheezing or shortness of breath.     fluticasone 50 MCG/ACT nasal spray  Commonly known as:  FLONASE  Place 1 spray into both nostrils 2 (two) times daily as needed for allergies or rhinitis.     furosemide 40 MG tablet  Commonly known as:  LASIX  Take 1 tablet (40 mg total) by mouth  daily.     irbesartan 75 MG tablet  Commonly known as:  AVAPRO  Take 37.5 mg by mouth daily.     metoprolol 100 MG tablet  Commonly known as:  LOPRESSOR  Take 1 tablet (100 mg total) by mouth 2 (two) times daily.     omeprazole 20 MG capsule  Commonly known as:  PRILOSEC  Take 20 mg by mouth daily before breakfast.     rivaroxaban 20 MG Tabs tablet  Commonly known as:  XARELTO  Take 20 mg by mouth daily with supper.       Follow-up Information    Follow up with [facility], NP On 02/07/2015.   Specialties:  Cardiology, Radiology   Why:  10:30    Contact information:   [address] [place] [address] [phone]      Greater than 30 minutes was spent completing the patient's discharge.    SignedTarri Fuller, [place] 01/24/2015, 1:06 PM  Personally seen and examined. Agree with above. -6.8L out 223 pounds Lasix 40 QD Salt  and fluid restrictions AFIB rate stable, Xarelto BP improved with fluid status KCL suppl. OK to DC home TOC 1-2 weeks.   Candee Furbish, MD

## 2015-01-24 NOTE — Progress Notes (Signed)
    Subjective: Breathing a lot better   Objective: Vital signs in last 24 hours: Temp:  [97.6 F (36.4 C)-98.1 F (36.7 C)] 98.1 F (36.7 C) (11/04 0607) Pulse Rate:  [73-101] 98 (11/04 0607) Resp:  [20] 20 (11/04 0607) BP: (112-137)/(40-82) 129/75 mmHg (11/04 0607) SpO2:  [93 %-100 %] 100 % (11/04 0607) Weight:  [223 lb 3.8 oz (101.261 kg)] 223 lb 3.8 oz (101.261 kg) (11/04 0607) Last BM Date: 01/23/15  Intake/Output from previous day: 11/03 0701 - 11/04 0700 In: 960 [P.O.:960] Out: 3200 [Urine:3200] Intake/Output this shift: Total I/O In: 240 [P.O.:240] Out: 100 [Urine:100]  Medications Scheduled Meds: . docusate sodium  100 mg Oral BID  . furosemide  40 mg Intravenous BID  . Influenza vac split quadrivalent PF  0.5 mL Intramuscular Tomorrow-1000  . irbesartan  37.5 mg Oral Daily  . metoprolol  100 mg Oral BID  . pantoprazole  40 mg Oral Daily  . pneumococcal 23 valent vaccine  0.5 mL Intramuscular Tomorrow-1000  . rivaroxaban  20 mg Oral Q supper   Continuous Infusions:  PRN Meds:.acetaminophen, albuterol, fluticasone, nitroGLYCERIN, ondansetron (ZOFRAN) IV  PE: General appearance: alert, cooperative and no distress Lungs: clear to auscultation bilaterally Heart: irregularly irregular rhythm Abdomen: +BS, soft, nontender Extremities: No LEE Pulses: 2+ and symmetric Skin: Warm and dry Neurologic: Grossly normal  Lab Results:   Recent Labs  01/21/15 1109 01/21/15 1125 01/22/15 0455  WBC 9.8  --  8.7  HGB 13.5 16.0* 12.2  HCT 41.8 47.0* 38.2  PLT 231  --  191   BMET  Recent Labs  01/22/15 0455 01/23/15 1445 01/24/15 0241  NA 139 138 139  K 3.1* 5.0 4.2  CL 100* 99* 101  CO2 32 32 28  GLUCOSE 100* 142* 103*  BUN 17 18 20   CREATININE 1.10* 1.09* 1.20*  CALCIUM 9.1 9.5 9.3   PT/INR  Recent Labs  01/21/15 1109  LABPROT 21.3*  INR 1.85*   Cholesterol  Recent Labs  01/22/15 0455  CHOL 118   Lipid Panel     Component Value  Date/Time   CHOL 118 01/22/2015 0455   TRIG 103 01/22/2015 0455   HDL 27* 01/22/2015 0455   CHOLHDL 4.4 01/22/2015 0455   VLDL 21 01/22/2015 0455   LDLCALC 70 01/22/2015 0455     Assessment/Plan  Principal Problem:   Acute on chronic diastolic heart failure (HCC) Net fluids:  -2.2/-7.0 L.  SCr up slightly to 1.20.  On ARB. change to PO lasix 40mg .  Daily weight monitoring and low sodium/carb diet discussed.     History of anticoagulant therapy: Xarelto    Chronic atrial fibrillation (HCC)  Rate stable. On lopressor 100bid.  On Xarelto.     OSA (obstructive sleep apnea)-CPAP   Hypertensive urgency  BP controlled on irbesartan 37.5 and lopressor 100bid.   Morbid obesity ([place])  Low carb diet advised   Blood in stool: No significant anemia.  Iron panel WNL   Acute diastolic CHF (congestive heart failure) ([place])     HAGER, BRYAN PA-C 01/24/2015 9:28 AM  Personally seen and examined. Agree with above. -6.8L out 223 pounds Lasix 40 QD Salt and fluid restrictions AFIB rate stable, Xarelto BP improved with fluid status KCL suppl. OK to DC home TOC 1-2 weeks.   Candee Furbish, MD

## 2015-01-27 ENCOUNTER — Ambulatory Visit (INDEPENDENT_AMBULATORY_CARE_PROVIDER_SITE_OTHER): Payer: Medicare Other | Admitting: Adult Health

## 2015-01-27 ENCOUNTER — Encounter: Payer: Self-pay | Admitting: Adult Health

## 2015-01-27 VITALS — BP 108/74 | HR 94 | Temp 97.6°F | Ht 61.0 in | Wt 223.0 lb

## 2015-01-27 DIAGNOSIS — E669 Obesity, unspecified: Secondary | ICD-10-CM | POA: Diagnosis not present

## 2015-01-27 DIAGNOSIS — G4733 Obstructive sleep apnea (adult) (pediatric): Secondary | ICD-10-CM

## 2015-01-27 NOTE — Patient Instructions (Signed)
Keep up good work with CPAP .At bedtime   Goal is to wear for at least 6hr each night.  Do not drive if sleepy.  Work on weight loss.  Follow up with Cardiology as planned  Follow up  Dr. Elsworth Soho in 4 months and As needed

## 2015-01-27 NOTE — Progress Notes (Signed)
Subjective:    Patient ID: Holly Smith, female    DOB: [birth year], 69 y.o.   MRN: [id number]  HPI 69 yo with moderate OSA    01/27/2015 Follow up : OSA  Pt presents for a  2 month follow up. Pt states breathing has improved and she has no new complaints. Uses CPAP every night, nasal pillows fit good. Wearing CPAP for 7 hrs each night  Says she feels better with CPAP  Download has been requested.  She denies chest pain, orthopnea, increased edema or fever.  Recent admit for CHF-she improved with diuresis .      Past Medical History  Diagnosis Date  . HPV (human papilloma virus) anogenital infection     03/21/12 PAP + HR HPV  . Abnormal Pap smear of vagina and vaginal HPV   . Hypertension   . Obesity   . Chronic anticoagulation     She is on Xarelto for Afib  . Chronic atrial fibrillation ([place])   . Echocardiogram abnormal     Mitral regurgitation, tricuspic regurgitation  . Urolithiasis   . Postoperative nausea and vomiting   . CHF (congestive heart failure) ([place])   . Insulin resistance   . History of stress test     Myoview 8/16: EF 42%, anteroseptal, inferoseptal, anterior, apical anterior and apical defect (likely represents breast attenuation versus scar), no ischemia; intermediate risk because of low EF  . Kidney stones   . Vulvar cancer ([place]) 2016    invasive verrucous carcinoma of the vulva. History of genital warts and vulvar condyloma  . COPD (chronic obstructive pulmonary disease) ([place])     "pulmonary drs said I don't have COPD fall 2016" (01/23/2015)  . OSA on CPAP   . GERD (gastroesophageal reflux disease)   . Arthritis     "bones ache" (01/23/2015)   Current Outpatient Prescriptions on File Prior to Visit  Medication Sig Dispense Refill  . acetaminophen (TYLENOL) 500 MG tablet Take 500 mg by mouth every 6 (six) hours as needed for mild pain.    Marland Kitchen albuterol (PROVENTIL HFA;VENTOLIN HFA) 108 (90 BASE) MCG/ACT inhaler Inhale 2 puffs into the lungs every 8  (eight) hours as needed for wheezing or shortness of breath. 1 Inhaler 6  . fluticasone (FLONASE) 50 MCG/ACT nasal spray Place 1 spray into both nostrils 2 (two) times daily as needed for allergies or rhinitis.    . furosemide (LASIX) 40 MG tablet Take 1 tablet (40 mg total) by mouth daily. 30 tablet 11  . irbesartan (AVAPRO) 75 MG tablet Take 37.5 mg by mouth daily.     . metoprolol (LOPRESSOR) 100 MG tablet Take 1 tablet (100 mg total) by mouth 2 (two) times daily. 60 tablet 11  . omeprazole (PRILOSEC) 20 MG capsule Take 20 mg by mouth daily before breakfast.    . rivaroxaban (XARELTO) 20 MG TABS tablet Take 20 mg by mouth daily with supper.      No current facility-administered medications on file prior to visit.    Review of Systems    Constitutional:   No  weight loss, night sweats,  Fevers, chills, + fatigue, or  lassitude.  HEENT:   No headaches,  Difficulty swallowing,  Tooth/dental problems, or  Sore throat,                No sneezing, itching, ear ache, nasal congestion, post nasal drip,   CV:  No chest pain,  Orthopnea, PND,   anasarca, dizziness, palpitations, syncope.  GI  No heartburn, indigestion, abdominal pain, nausea, vomiting, diarrhea, change in bowel habits, loss of appetite, bloody stools.   Resp:    No excess mucus, no productive cough,  No non-productive cough,  No coughing up of blood.  No change in color of mucus.  No wheezing.  No chest wall deformity  Skin: no rash or lesions.  GU: no dysuria, change in color of urine, no urgency or frequency.  No flank pain, no hematuria   MS:  No joint pain or swelling.  No decreased range of motion.  No back pain.  Psych:  No change in mood or affect. No depression or anxiety.  No memory loss.      Objective:   Physical Exam  GEN: A/Ox3; pleasant , NAD, obese   HEENT:  [place]/AT,  EACs-clear, TMs-wnl, NOSE-clear, THROAT-clear, no lesions, no postnasal drip or exudate noted.   NECK:  Supple w/ fair ROM; no JVD;  normal carotid impulses w/o bruits; no thyromegaly or nodules palpated; no lymphadenopathy.  RESP  Clear  P & A; w/o, wheezes/ rales/ or rhonchi.no accessory muscle use, no dullness to percussion  CARD:  RRR, no m/r/g  , tr  peripheral edema, pulses intact, no cyanosis or clubbing.  GI:   Soft & nt; nml bowel sounds; no organomegaly or masses detected.  Musco: Warm bil, no deformities or joint swelling noted.   Neuro: alert, no focal deficits noted.    Skin: Warm, no lesions or rashes        Assessment & Plan:

## 2015-01-28 ENCOUNTER — Observation Stay (HOSPITAL_COMMUNITY)
Admission: EM | Admit: 2015-01-28 | Discharge: 2015-01-31 | Disposition: A | Discharge status: Home / Self Care | Payer: Medicare Other | Attending: Internal Medicine | Admitting: Internal Medicine

## 2015-01-28 ENCOUNTER — Encounter: Payer: Self-pay | Admitting: Family Medicine

## 2015-01-28 ENCOUNTER — Emergency Department (HOSPITAL_COMMUNITY): Payer: Medicare Other

## 2015-01-28 ENCOUNTER — Encounter (HOSPITAL_COMMUNITY): Payer: Self-pay

## 2015-01-28 ENCOUNTER — Telehealth (HOSPITAL_COMMUNITY): Payer: Self-pay | Admitting: Surgery

## 2015-01-28 ENCOUNTER — Ambulatory Visit (INDEPENDENT_AMBULATORY_CARE_PROVIDER_SITE_OTHER): Payer: Medicare Other | Admitting: Family Medicine

## 2015-01-28 VITALS — BP 100/70 | HR 62 | Ht 61.0 in | Wt 222.0 lb

## 2015-01-28 DIAGNOSIS — Z87891 Personal history of nicotine dependence: Secondary | ICD-10-CM | POA: Insufficient documentation

## 2015-01-28 DIAGNOSIS — Z8544 Personal history of malignant neoplasm of other female genital organs: Secondary | ICD-10-CM | POA: Insufficient documentation

## 2015-01-28 DIAGNOSIS — K5733 Diverticulitis of large intestine without perforation or abscess with bleeding: Secondary | ICD-10-CM | POA: Diagnosis not present

## 2015-01-28 DIAGNOSIS — J449 Chronic obstructive pulmonary disease, unspecified: Secondary | ICD-10-CM | POA: Insufficient documentation

## 2015-01-28 DIAGNOSIS — K5792 Diverticulitis of intestine, part unspecified, without perforation or abscess without bleeding: Secondary | ICD-10-CM | POA: Diagnosis not present

## 2015-01-28 DIAGNOSIS — Z7901 Long term (current) use of anticoagulants: Secondary | ICD-10-CM | POA: Insufficient documentation

## 2015-01-28 DIAGNOSIS — I5042 Chronic combined systolic (congestive) and diastolic (congestive) heart failure: Secondary | ICD-10-CM | POA: Insufficient documentation

## 2015-01-28 DIAGNOSIS — Z79899 Other long term (current) drug therapy: Secondary | ICD-10-CM | POA: Diagnosis not present

## 2015-01-28 DIAGNOSIS — I482 Chronic atrial fibrillation, unspecified: Secondary | ICD-10-CM | POA: Diagnosis present

## 2015-01-28 DIAGNOSIS — K5732 Diverticulitis of large intestine without perforation or abscess without bleeding: Principal | ICD-10-CM | POA: Insufficient documentation

## 2015-01-28 DIAGNOSIS — I10 Essential (primary) hypertension: Secondary | ICD-10-CM | POA: Insufficient documentation

## 2015-01-28 DIAGNOSIS — Z6841 Body Mass Index (BMI) 40.0 and over, adult: Secondary | ICD-10-CM | POA: Insufficient documentation

## 2015-01-28 DIAGNOSIS — I429 Cardiomyopathy, unspecified: Secondary | ICD-10-CM | POA: Insufficient documentation

## 2015-01-28 DIAGNOSIS — L03012 Cellulitis of left finger: Secondary | ICD-10-CM

## 2015-01-28 DIAGNOSIS — R109 Unspecified abdominal pain: Secondary | ICD-10-CM

## 2015-01-28 DIAGNOSIS — G4733 Obstructive sleep apnea (adult) (pediatric): Secondary | ICD-10-CM | POA: Insufficient documentation

## 2015-01-28 HISTORY — DX: Chronic combined systolic (congestive) and diastolic (congestive) heart failure: I50.42

## 2015-01-28 HISTORY — DX: Diverticulitis of large intestine without perforation or abscess with bleeding: K57.33

## 2015-01-28 HISTORY — DX: Diverticulitis of intestine, part unspecified, without perforation or abscess without bleeding: K57.92

## 2015-01-28 LAB — URINALYSIS, ROUTINE W REFLEX MICROSCOPIC
BILIRUBIN URINE: NEGATIVE
Glucose, UA: NEGATIVE mg/dL
Ketones, ur: NEGATIVE mg/dL
Leukocytes, UA: NEGATIVE
NITRITE: POSITIVE — AB
Protein, ur: NEGATIVE mg/dL
SPECIFIC GRAVITY, URINE: 1.027 (ref 1.005–1.030)
UROBILINOGEN UA: 1 mg/dL (ref 0.0–1.0)
pH: 5 (ref 5.0–8.0)

## 2015-01-28 LAB — CBC
HEMATOCRIT: 45.6 % (ref 36.0–46.0)
HEMOGLOBIN: 14.9 g/dL (ref 12.0–15.0)
MCH: 30.6 pg (ref 26.0–34.0)
MCHC: 32.7 g/dL (ref 30.0–36.0)
MCV: 93.6 fL (ref 78.0–100.0)
Platelets: 250 10*3/uL (ref 150–400)
RBC: 4.87 MIL/uL (ref 3.87–5.11)
RDW: 13.1 % (ref 11.5–15.5)
WBC: 14.5 10*3/uL — AB (ref 4.0–10.5)

## 2015-01-28 LAB — URINE MICROSCOPIC-ADD ON

## 2015-01-28 LAB — COMPREHENSIVE METABOLIC PANEL
ALBUMIN: 3.6 g/dL (ref 3.5–5.0)
ALT: 15 U/L (ref 14–54)
AST: 16 U/L (ref 15–41)
Alkaline Phosphatase: 70 U/L (ref 38–126)
Anion gap: 12 (ref 5–15)
BILIRUBIN TOTAL: 1.1 mg/dL (ref 0.3–1.2)
BUN: 18 mg/dL (ref 6–20)
CO2: 22 mmol/L (ref 22–32)
Calcium: 9.5 mg/dL (ref 8.9–10.3)
Chloride: 98 mmol/L — ABNORMAL LOW (ref 101–111)
Creatinine, Ser: 1.16 mg/dL — ABNORMAL HIGH (ref 0.44–1.00)
GFR calc Af Amer: 54 mL/min — ABNORMAL LOW (ref >60)
GFR, EST NON AFRICAN AMERICAN: 46 mL/min — AB (ref >60)
Glucose, Bld: 110 mg/dL — ABNORMAL HIGH (ref 65–99)
POTASSIUM: 4.6 mmol/L (ref 3.5–5.1)
Sodium: 132 mmol/L — ABNORMAL LOW (ref 135–145)
TOTAL PROTEIN: 8.1 g/dL (ref 6.5–8.1)

## 2015-01-28 LAB — POCT URINALYSIS DIPSTICK
BILIRUBIN UA: NEGATIVE
GLUCOSE UA: NEGATIVE
KETONES UA: NEGATIVE
LEUKOCYTES UA: NEGATIVE
Nitrite, UA: NEGATIVE
PROTEIN UA: NEGATIVE
SPEC GRAV UA: 1.015
Urobilinogen, UA: 0.2
pH, UA: 5

## 2015-01-28 LAB — MAGNESIUM: Magnesium: 2.1 mg/dL (ref 1.7–2.4)

## 2015-01-28 LAB — TYPE AND SCREEN
ABO/RH(D): O NEG
ANTIBODY SCREEN: NEGATIVE

## 2015-01-28 LAB — LIPASE, BLOOD: Lipase: 29 U/L (ref 11–51)

## 2015-01-28 LAB — TROPONIN I: Troponin I: 0.05 ng/mL — ABNORMAL HIGH (ref <0.031)

## 2015-01-28 MED ORDER — PIPERACILLIN-TAZOBACTAM 3.375 G IVPB 30 MIN
3.3750 g | Freq: Once | INTRAVENOUS | Status: AC
  Administered 2015-01-28: 3.375 g via INTRAVENOUS

## 2015-01-28 MED ORDER — METRONIDAZOLE IN NACL 5-0.79 MG/ML-% IV SOLN
500.0000 mg | Freq: Once | INTRAVENOUS | Status: AC
  Administered 2015-01-28: 500 mg via INTRAVENOUS
  Filled 2015-01-28: qty 100

## 2015-01-28 MED ORDER — ONDANSETRON HCL 4 MG PO TABS: 4.0000 mg | ORAL_TABLET | Freq: Four times a day (QID) | ORAL | Status: DC | PRN

## 2015-01-28 MED ORDER — SODIUM CHLORIDE 0.9 % IV SOLN
INTRAVENOUS | Status: DC
  Administered 2015-01-28: 23:00:00 via INTRAVENOUS

## 2015-01-28 MED ORDER — MORPHINE SULFATE (PF) 4 MG/ML IV SOLN
4.0000 mg | Freq: Once | INTRAVENOUS | Status: AC
  Administered 2015-01-28: 4 mg via INTRAVENOUS
  Filled 2015-01-28: qty 1

## 2015-01-28 MED ORDER — ACETAMINOPHEN 325 MG PO TABS
650.0000 mg | ORAL_TABLET | Freq: Four times a day (QID) | ORAL | Status: DC | PRN
  Administered 2015-01-29 – 2015-01-30 (×3): 650 mg via ORAL
  Filled 2015-01-28 (×3): qty 2

## 2015-01-28 MED ORDER — IOHEXOL 300 MG/ML  SOLN
80.0000 mL | Freq: Once | INTRAMUSCULAR | Status: AC | PRN
  Administered 2015-01-28: 80 mL via INTRAVENOUS

## 2015-01-28 MED ORDER — PANTOPRAZOLE SODIUM 40 MG PO TBEC
40.0000 mg | DELAYED_RELEASE_TABLET | Freq: Every day | ORAL | Status: DC
  Administered 2015-01-29 – 2015-01-31 (×3): 40 mg via ORAL
  Filled 2015-01-28 (×3): qty 1

## 2015-01-28 MED ORDER — SODIUM CHLORIDE 0.9 % IJ SOLN
3.0000 mL | Freq: Two times a day (BID) | INTRAMUSCULAR | Status: DC
  Administered 2015-01-30 – 2015-01-31 (×2): 3 mL via INTRAVENOUS

## 2015-01-28 MED ORDER — ACETAMINOPHEN 650 MG RE SUPP: 650.0000 mg | Freq: Four times a day (QID) | RECTAL | Status: DC | PRN

## 2015-01-28 MED ORDER — METOPROLOL TARTRATE 50 MG PO TABS
50.0000 mg | ORAL_TABLET | Freq: Two times a day (BID) | ORAL | Status: DC
  Administered 2015-01-28 – 2015-01-31 (×6): 50 mg via ORAL
  Filled 2015-01-28 (×6): qty 1

## 2015-01-28 MED ORDER — PIPERACILLIN-TAZOBACTAM 3.375 G IVPB
3.3750 g | Freq: Three times a day (TID) | INTRAVENOUS | Status: DC
  Administered 2015-01-29 – 2015-01-31 (×7): 3.375 g via INTRAVENOUS
  Filled 2015-01-28 (×10): qty 50

## 2015-01-28 MED ORDER — ONDANSETRON HCL 4 MG/2ML IJ SOLN: 4.0000 mg | Freq: Four times a day (QID) | INTRAMUSCULAR | Status: DC | PRN

## 2015-01-28 MED ORDER — FLUTICASONE PROPIONATE 50 MCG/ACT NA SUSP
1.0000 | Freq: Two times a day (BID) | NASAL | Status: DC | PRN
  Filled 2015-01-28: qty 16

## 2015-01-28 MED ORDER — RIVAROXABAN 20 MG PO TABS
20.0000 mg | ORAL_TABLET | Freq: Every day | ORAL | Status: DC
  Administered 2015-01-29 – 2015-01-30 (×2): 20 mg via ORAL
  Filled 2015-01-28 (×2): qty 1

## 2015-01-28 MED ORDER — CIPROFLOXACIN IN D5W 400 MG/200ML IV SOLN
400.0000 mg | Freq: Once | INTRAVENOUS | Status: DC
  Filled 2015-01-28: qty 200

## 2015-01-28 MED ORDER — ONDANSETRON HCL 4 MG/2ML IJ SOLN
4.0000 mg | Freq: Once | INTRAMUSCULAR | Status: AC
  Administered 2015-01-28: 4 mg via INTRAVENOUS
  Filled 2015-01-28: qty 2

## 2015-01-28 MED ORDER — ALBUTEROL SULFATE (2.5 MG/3ML) 0.083% IN NEBU
2.5000 mg | INHALATION_SOLUTION | Freq: Four times a day (QID) | RESPIRATORY_TRACT | Status: DC
  Administered 2015-01-29: 2.5 mg via RESPIRATORY_TRACT
  Filled 2015-01-28 (×2): qty 3

## 2015-01-28 MED ORDER — SODIUM CHLORIDE 0.9 % IV SOLN
INTRAVENOUS | Status: AC
Start: 2015-01-28 — End: 2015-01-29
  Administered 2015-01-29: via INTRAVENOUS

## 2015-01-28 MED ORDER — SODIUM CHLORIDE 0.9 % IV BOLUS (SEPSIS)
1000.0000 mL | Freq: Once | INTRAVENOUS | Status: AC
  Administered 2015-01-28: 1000 mL via INTRAVENOUS

## 2015-01-28 MED ORDER — ALBUTEROL SULFATE (2.5 MG/3ML) 0.083% IN NEBU: 2.5000 mg | INHALATION_SOLUTION | Freq: Four times a day (QID) | RESPIRATORY_TRACT | Status: DC

## 2015-01-28 NOTE — Progress Notes (Signed)
Name: Holly Smith   MRN: [id number]    DOB: December 05, 1945   Date:01/28/2015       Progress Note  Subjective  Chief Complaint  Chief Complaint  Patient presents with  . Abdominal Pain    pain across front of abdomen radiating to R) side around to back. Had fluid drawn off and has been hurting since then    Abdominal Pain This is a new problem. The current episode started in the past 7 days. The onset quality is sudden. The problem occurs constantly. The most recent episode lasted 4 days. The pain is located in the RLQ and suprapubic region. The pain is at a severity of 5/10. The pain is moderate. The quality of the pain is sharp and aching. The abdominal pain radiates to the back. Associated symptoms include belching, hematochezia, nausea and weight loss. Pertinent negatives include no anorexia, arthralgias, constipation, diarrhea, dysuria, fever, flatus, frequency, headaches, hematuria, melena or vomiting. Associated symptoms comments: Last bm. The pain is aggravated by movement (running over railroad tracks). The pain is relieved by being still. She has tried nothing for the symptoms.    No problem-specific assessment & plan notes found for this encounter.   Past Medical History  Diagnosis Date  . HPV (human papilloma virus) anogenital infection     03/21/12 PAP + HR HPV  . Abnormal Pap smear of vagina and vaginal HPV   . Hypertension   . Obesity   . Chronic anticoagulation     She is on Xarelto for Afib  . Chronic atrial fibrillation ([place])   . Echocardiogram abnormal     Mitral regurgitation, tricuspic regurgitation  . Urolithiasis   . Postoperative nausea and vomiting   . CHF (congestive heart failure) ([place])   . Insulin resistance   . History of stress test     Myoview 8/16: EF 42%, anteroseptal, inferoseptal, anterior, apical anterior and apical defect (likely represents breast attenuation versus scar), no ischemia; intermediate risk because of low EF  . Kidney stones    . Vulvar cancer ([place]) 2016    invasive verrucous carcinoma of the vulva. History of genital warts and vulvar condyloma  . COPD (chronic obstructive pulmonary disease) ([place])     "pulmonary drs said I don't have COPD fall 2016" (01/23/2015)  . OSA on CPAP   . GERD (gastroesophageal reflux disease)   . Arthritis     "bones ache" (01/23/2015)    Past Surgical History  Procedure Laterality Date  . Vulvectomy Right 05/10/2014    Excisional biopsy of the superior right labial majora mass; [facility]  . Bilateral salpingoophorectomy  2003    benign ovarian cancer  . Lithotripsy    . Vulvectomy partial  07/02/2014    Re-excision and sentinel node dissection at [facility].   . Tonsillectomy    . Appendectomy    . Abdominal hysterectomy    . Cardioversion  2014-2015    "[place]"    Family History  Problem Relation Age of Onset  . Prostate cancer Brother 58    still living and well  . Heart attack Father   . Stroke Mother   . Diabetes Mother   . Hypertension Mother   . Heart attack Brother   . Healthy Sister     Social History   Social History  . Marital Status: Divorced    Spouse Name: N/A  . Number of Children: N/A  . Years of Education: N/A   Occupational History  . Not  on file.   Social History Main Topics  . Smoking status: Former Smoker -- 1.00 packs/day for 50 years    Types: Cigarettes, E-cigarettes    Quit date: 05/20/2012  . Smokeless tobacco: Never Used  . Alcohol Use: 0.0 oz/week    0 Standard drinks or equivalent per week     Comment: 01/23/2015 "I' used to drink socially a few times/month"  . Drug Use: No  . Sexual Activity: Not Currently   Other Topics Concern  . Not on file   Social History Narrative    No Known Allergies   Review of Systems  Constitutional: Positive for weight loss. Negative for fever.  Gastrointestinal: Positive for nausea, abdominal pain and hematochezia. Negative for vomiting, diarrhea, constipation, melena,  anorexia and flatus.  Genitourinary: Negative for dysuria, frequency and hematuria.  Musculoskeletal: Negative for arthralgias.  Neurological: Negative for headaches.     Objective  Filed Vitals:   01/28/15 1110  BP: 100/70  Pulse: 62  Height: 5\' 1"  (1.549 m)  Weight: 222 lb (100.699 kg)    Physical Exam  Constitutional: She is well-developed, well-nourished, and in no distress. No distress.  HENT:  Head: Normocephalic and atraumatic.  Right Ear: External ear normal.  Left Ear: External ear normal.  Nose: Nose normal.  Mouth/Throat: Oropharynx is clear and moist.  Eyes: Conjunctivae and EOM are normal. Pupils are equal, round, and reactive to light. Right eye exhibits no discharge. Left eye exhibits no discharge.  Neck: Normal range of motion. Neck supple. No JVD present. No thyromegaly present.  Cardiovascular: Normal rate, regular rhythm, normal heart sounds and intact distal pulses.  Exam reveals no gallop and no friction rub.   No murmur heard. Pulmonary/Chest: Effort normal and breath sounds normal.  Abdominal: Soft. Bowel sounds are normal. She exhibits distension. She exhibits no mass. There is tenderness. There is rebound and guarding.  Musculoskeletal: Normal range of motion. She exhibits no edema.  Lymphadenopathy:    She has no cervical adenopathy.  Neurological: She is alert.  Skin: Skin is warm and dry. She is not diaphoretic.  Psychiatric: Mood and affect normal.      Assessment & Plan  Problem List Items Addressed This Visit    None    Visit Diagnoses    Abdominal pain, unspecified abdominal location    -  Primary    hematochezia/ tenderness with guardibg and rebound/ referral to er for evaluation    Relevant Orders    POCT Urinalysis Dipstick (Completed)      Pt is being sent to ER for further evaluation. She is "seeing blood in stools", having abdominal pain and gross hematuria in urine.    Dr. Mac[facility]  01/28/2015

## 2015-01-28 NOTE — Telephone Encounter (Signed)
Heart Failure Nurse Navigator Post Discharge Telephone Call  I called Holly Smith and spoke with her daughter in law regarding her recent hospitalization.  She tells me that her mother- in- law has been doing well except for today she is "doubled over" with abdominal pain which they believe to be caused from constipation.  She is currently headed to her PCP regarding that issue.  She does say that her mother in law has been weighing each day and her weights "are down".  She also says that she is not having any swelling or SOB.  Her mother- in- law has follow- up scheduled with [facility] on 02/07/15 at 39.  I have encouraged her to call me back if they have any issues or concerns regarding her mother-in-law's HF.

## 2015-01-28 NOTE — ED Provider Notes (Signed)
I saw and evaluated the patient, reviewed the resident's note and I agree with the findings and plan.  Pertinent History: hx of recent admission for SOB, now presents with LLQ abd pain  Pertinent Exam findings: LLQ ttp, no peritoneal signs  Admit for diverticulitis    Final diagnoses:  Diverticulitis of large intestine without perforation or abscess with bleeding      Noemi Chapel, MD 01/31/15 1958

## 2015-01-28 NOTE — ED Notes (Signed)
Pt reports abdominal pain with blood in stools and hematuria x 7 days. Denies N/V/D. Pt denies fever. Denies weakness.

## 2015-01-28 NOTE — ED Provider Notes (Signed)
Arrival Date & Time: 01/28/15 & 1252 History   Chief Complaint  Patient presents with  . Abdominal Pain   HPI Holly Smith is a 69 y.o. female who presents for assessment of abdominal pain for the past week, 7 days in total. Onset sudden, and duration has been constant. Pain located in the RLQ and LLQ. Severity moderate. Worse with movement and subsides with holding still and rest. Per patient has has bright red blood per rectum per stools for 3 months. On xarelto. Mild lower flank pain on right where she has had prior stone requiring lithotripsy.  Patient endorses history of nausea weight loss and emesis with scant amount of blood. Patient has no history of liver failure does have history of CHF with stress test performed in August of 2 and 16 and showed EF of 42% no ischemia however does have wall motion abnormalities prior kidney stones with full fever cancer COPD reflux and surgical history of complete total hysterectomy with bilateral oophorectomy and appendectomy prior cardioversion and vulvectomy due to invasive cancer of vulva.  Past Medical History  I reviewed & agree with nursing's documentation on PMHx, PSHx, SHx and FHx. Past Medical History  Diagnosis Date  . HPV (human papilloma virus) anogenital infection     03/21/12 PAP + HR HPV  . Abnormal Pap smear of vagina and vaginal HPV   . Hypertension   . Obesity   . Chronic anticoagulation     She is on Xarelto for Afib  . Chronic atrial fibrillation ([place])   . Echocardiogram abnormal     Mitral regurgitation, tricuspic regurgitation  . Urolithiasis   . Postoperative nausea and vomiting   . CHF (congestive heart failure) ([place])   . Insulin resistance   . History of stress test     Myoview 8/16: EF 42%, anteroseptal, inferoseptal, anterior, apical anterior and apical defect (likely represents breast attenuation versus scar), no ischemia; intermediate risk because of low EF  . Kidney stones   . Vulvar cancer ([place]) 2016    invasive verrucous carcinoma of the vulva. History of genital warts and vulvar condyloma  . COPD (chronic obstructive pulmonary disease) ([place])     "pulmonary drs said I don't have COPD fall 2016" (01/23/2015)  . OSA on CPAP   . GERD (gastroesophageal reflux disease)   . Arthritis     "bones ache" (01/23/2015)   Past Surgical History  Procedure Laterality Date  . Vulvectomy Right 05/10/2014    [facility]  . Bilateral salpingoophorectomy  2003    benign ovarian cancer  . Lithotripsy    . Vulvectomy partial  07/02/2014    Re-excision and sentinel node dissection at [facility].   . Tonsillectomy    . Appendectomy    . Abdominal hysterectomy    . Cardioversion  2014-2015    "[place]"   Social History   Social History  . Marital Status: Divorced    Spouse Name: N/A  . Number of Children: N/A  . Years of Education: N/A   Social History Main Topics  . Smoking status: Former Smoker -- 1.00 packs/day for 50 years    Types: Cigarettes, E-cigarettes    Quit date: 05/20/2012  . Smokeless tobacco: Never Used  . Alcohol Use: 0.0 oz/week    0 Standard drinks or equivalent per week     Comment: 01/23/2015 "I' used to drink socially a few times/month"  . Drug Use: No  .  Sexual Activity: Not Currently   Other Topics Concern  . None   Social History Narrative   Family History  Problem Relation Age of Onset  . Prostate cancer Brother 21    still living and well  . Heart attack Father   . Stroke Mother   . Diabetes Mother   . Hypertension Mother   . Heart attack Brother   . Healthy Sister     Review of Systems   Complete ROS performed by me, all positives & negatives documented as above in HPI. All other ROS negative.  Allergies  Review of patient's allergies indicates no known allergies.  Home Medications   Prior to Admission medications   Medication Sig Start Date End Date Taking? Authorizing  Provider  acetaminophen (TYLENOL) 500 MG tablet Take 500 mg by mouth every 6 (six) hours as needed for mild pain.    Historical Provider, MD  albuterol (PROVENTIL HFA;VENTOLIN HFA) 108 (90 BASE) MCG/ACT inhaler Inhale 2 puffs into the lungs every 8 (eight) hours as needed for wheezing or shortness of breath. 11/27/14   Rigoberto Noel, MD  fluticasone (FLONASE) 50 MCG/ACT nasal spray Place 1 spray into both nostrils 2 (two) times daily as needed for allergies or rhinitis.    Historical Provider, MD  furosemide (LASIX) 40 MG tablet Take 1 tablet (40 mg total) by mouth daily. 01/24/15   Brett Canales, PA-C  irbesartan (AVAPRO) 75 MG tablet Take 37.5 mg by mouth daily.     Historical Provider, MD  metoprolol (LOPRESSOR) 100 MG tablet Take 1 tablet (100 mg total) by mouth 2 (two) times daily. 01/24/15   Brett Canales, PA-C  omeprazole (PRILOSEC) 20 MG capsule Take 20 mg by mouth daily before breakfast.    Historical Provider, MD  rivaroxaban (XARELTO) 20 MG TABS tablet Take 20 mg by mouth daily with supper.  07/15/14   Historical Provider, MD    Physical Exam  BP 126/75 mmHg  Pulse 100  Temp(Src) 98.1 F (36.7 C) (Oral)  Resp 18  SpO2 96% Physical Exam  Constitutional: She is oriented to person, place, and time. She appears well-developed and well-nourished.  Non-toxic appearance. She does not appear ill. No distress.  HENT:  Head: Normocephalic and atraumatic.  Right Ear: External ear normal.  Left Ear: External ear normal.  Eyes: Pupils are equal, round, and reactive to light. No scleral icterus.  Neck: Normal range of motion. Neck supple. No tracheal deviation present.  Cardiovascular: Normal heart sounds and intact distal pulses.   No murmur heard. Pulmonary/Chest: Effort normal and breath sounds normal. No stridor. No respiratory distress. She has no wheezes. She has no rales.  Abdominal: Soft. Bowel sounds are normal. She exhibits no distension. There is tenderness (generalized worse in LLQ  and RLQ.). There is rebound and guarding.  Genitourinary:  Rectal exam reveals no gross blood. Internal hemorrhoids without prolapse. No external hemorrhoids.   Musculoskeletal: Normal range of motion.  Neurological: She is alert and oriented to person, place, and time. She has normal strength and normal reflexes. No cranial nerve deficit or sensory deficit.  Skin: Skin is warm and dry. No pallor.  Psychiatric: She has a normal mood and affect. Her behavior is normal.  Nursing note and vitals reviewed.   ED Course  Procedures Labs Review Labs Reviewed  COMPREHENSIVE METABOLIC PANEL - Abnormal; Notable for the following:    Sodium 132 (*)    Chloride 98 (*)    Glucose, Bld 110 (*)  Creatinine, Ser 1.16 (*)    GFR calc non Af Amer 46 (*)    GFR calc Af Amer 54 (*)    All other components within normal limits  CBC - Abnormal; Notable for the following:    WBC 14.5 (*)    All other components within normal limits  LIPASE, BLOOD  URINALYSIS, ROUTINE W REFLEX MICROSCOPIC (NOT AT [facility])  TYPE AND SCREEN   Imaging Review No results found.  Laboratory and Imaging results were personally reviewed by myself and used in the medical decision making of this patient's treatment and disposition.  EKG Interpretation  EKG Interpretation  Date/Time:    Ventricular Rate:    PR Interval:    QRS Duration:   QT Interval:    QTC Calculation:   R Axis:     Text Interpretation:        MDM  Rayette Annetta Deiss is a 69 y.o. female with H&P as above. ED clinical course as follows: FOBT negative. Hgb appropriate. No concern for GI bleed at this time.  Abdominal exam concerning for rebound and moderate generalized ttp therefore I obtained a CT ABD PLEV w/ and personal visualization of which reveals large cecal diverticulum with acute diverticulitis. And inflammation of terminal ileum.  Therefore I provided patient with IV flagyl and zosyn along with IVF and Due to concerns for acute  diverticulitis, I consulted the hospitalist service and discussed the patient's complete H&P along with their ED clinical course to now. Specifically discussed the patient's imaging and labs. Based upon that discussion, they state the patient requires admisson for further treatment and evaluation.  Per discussion with admitting service, level of care agreed upon and requested interventions provided prior to transport.  Disposition: Admit  Clinical Impression: No diagnosis found. Patient care discussed with Dr. Sabra Heck, who oversaw their evaluation & treatment & voiced agreement. House Officer: Voncille Lo, MD, Emergency Medicine.  Voncille Lo, MD 01/30/15 [id number]  Noemi Chapel, MD 01/31/15 [id number]

## 2015-01-28 NOTE — ED Notes (Signed)
Report attempted 

## 2015-01-28 NOTE — ED Notes (Signed)
Pt made aware of bed assignement 

## 2015-01-28 NOTE — H&P (Signed)
[facility] History and Physical  Holly Smith ERX:[id number] DOB: May 14, 1945 DOA: 01/28/2015  Referring physician:  PCP: Otilio Miu, MD   Chief Complaint:   HPI:  69 yr old female with a hx of probable tachycardia mediated cardiomyopathy, systolic HF and persistent/chronic AFib s/p prior DCCV, HTN, HL, OSA on CPAP, recently admitted on 11/1-11/4 with acute on chronic diastolic heart failure in the setting of her A. fib obesity and chronic diastolic heart failure, whopresented to ED with abdominal pain which the patient thought was due to constipation.symptoms started prior to her discharge but the patient did not report them. Symptoms worsened after discharge, with worsening abdominal pain with blood in stools and intermittent hematochezia.Pain located in the RLQ and LLQ. Severity moderate. Patient states that she has noted intermittent hematochezia for several weeks and she has mentioned it to a cardiologist and has been recommended to continue with anticoagulation because the bleeding is not severe. Denies N/V/D. Pt denies fever. UA negative. CT showed Acute uncomplicated diverticulitis involving the cecum with a large inflamed cecal diverticulum measuring 3.4 cm.      Review of Systems: negative for the following  Constitutional: Denies fever, chills, diaphoresis, appetite change and fatigue.  HEENT: Denies photophobia, eye pain, redness, hearing loss, ear pain, congestion, sore throat, rhinorrhea, sneezing, mouth sores, trouble swallowing, neck pain, neck stiffness and tinnitus.  Respiratory: Denies SOB, DOE, cough, chest tightness, and wheezing.  Cardiovascular: Denies chest pain, palpitations and leg swelling.  Gastrointestinal: Positive for nausea, denies  vomiting, positive for abdominal pain, diarrhea, constipation, positive for blood in stool and abdominal distention.  Genitourinary: Denies dysuria, urgency, frequency, hematuria, flank pain and difficulty urinating.   Musculoskeletal: Denies myalgias, back pain, joint swelling, arthralgias and gait problem.  Skin: Denies pallor, rash and wound.  Neurological: Denies dizziness, seizures, syncope, weakness, light-headedness, numbness and headaches.  Hematological: Denies adenopathy. Easy bruising, personal or family bleeding history  Psychiatric/Behavioral: Denies suicidal ideation, mood changes, confusion, nervousness, sleep disturbance and agitation       Past Medical History  Diagnosis Date  . HPV (human papilloma virus) anogenital infection     03/21/12 PAP + HR HPV  . Abnormal Pap smear of vagina and vaginal HPV   . Hypertension   . Obesity   . Chronic anticoagulation     She is on Xarelto for Afib  . Chronic atrial fibrillation ([place])   . Echocardiogram abnormal     Mitral regurgitation, tricuspic regurgitation  . Urolithiasis   . Postoperative nausea and vomiting   . CHF (congestive heart failure) ([place])   . Insulin resistance   . History of stress test     Myoview 8/16: EF 42%, anteroseptal, inferoseptal, anterior, apical anterior and apical defect (likely represents breast attenuation versus scar), no ischemia; intermediate risk because of low EF  . Kidney stones   . Vulvar cancer ([place]) 2016    invasive verrucous carcinoma of the vulva. History of genital warts and vulvar condyloma  . COPD (chronic obstructive pulmonary disease) ([place])     "pulmonary drs said I don't have COPD fall 2016" (01/23/2015)  . OSA on CPAP   . GERD (gastroesophageal reflux disease)   . Arthritis     "bones ache" (01/23/2015)     Past Surgical History  Procedure Laterality Date  . Vulvectomy Right 05/10/2014    Excisional biopsy of the superior right labial majora mass; [facility]  . Bilateral salpingoophorectomy  2003    benign ovarian cancer  . Lithotripsy    .  Vulvectomy partial  07/02/2014    Re-excision and sentinel node dissection at [facility]   . Tonsillectomy    . Appendectomy    . Abdominal  hysterectomy    . Cardioversion  2014-2015    "[place]"      Social History:  reports that she quit smoking about 2 years ago. Her smoking use included Cigarettes and E-cigarettes. She has a 50 pack-year smoking history. She has never used smokeless tobacco. She reports that she drinks alcohol. She reports that she does not use illicit drugs.    No Known Allergies  Family History  Problem Relation Age of Onset  . Prostate cancer Brother 53    still living and well  . Heart attack Father   . Stroke Mother   . Diabetes Mother   . Hypertension Mother   . Heart attack Brother   . Healthy Sister           Prior to Admission medications   Medication Sig Start Date End Date Taking? Authorizing Provider  albuterol (PROVENTIL HFA;VENTOLIN HFA) 108 (90 BASE) MCG/ACT inhaler Inhale 2 puffs into the lungs every 8 (eight) hours as needed for wheezing or shortness of breath. 11/27/14  Yes Rigoberto Noel, MD  fluticasone (FLONASE) 50 MCG/ACT nasal spray Place 1 spray into both nostrils 2 (two) times daily as needed for allergies or rhinitis.   Yes Historical Provider, MD  furosemide (LASIX) 40 MG tablet Take 1 tablet (40 mg total) by mouth daily. 01/24/15  Yes Brett Canales, PA-C  irbesartan (AVAPRO) 75 MG tablet Take 37.5 mg by mouth daily.    Yes Historical Provider, MD  metoprolol (LOPRESSOR) 100 MG tablet Take 1 tablet (100 mg total) by mouth 2 (two) times daily. 01/24/15  Yes Brett Canales, PA-C  omeprazole (PRILOSEC) 20 MG capsule Take 20 mg by mouth daily before breakfast.   Yes Historical Provider, MD  rivaroxaban (XARELTO) 20 MG TABS tablet Take 20 mg by mouth daily with supper.  07/15/14  Yes Historical Provider, MD     Physical Exam: Filed Vitals:   01/28/15 1645 01/28/15 1900 01/28/15 2006 01/28/15 2030  BP: 108/81 124/77 103/64 101/76  Pulse: 110 66 107 104  Temp:      TempSrc:      Resp:      SpO2: 95% 99% 90% 95%     Constitutional: Vital signs reviewed. Patient  is a well-developed and well-nourished in no acute distress and cooperative with exam. Alert and oriented x3.  Head: Normocephalic and atraumatic  Ear: TM normal bilaterally  Mouth: no erythema or exudates, MMM  Eyes: PERRL, EOMI, conjunctivae normal, No scleral icterus.  Neck: Supple, Trachea midline normal ROM, No JVD, mass, thyromegaly, or carotid bruit present.  Cardiovascular: RRR, S1 normal, S2 normal, no MRG, pulses symmetric and intact bilaterally  Pulmonary/Chest: Effort normal and breath sounds normal. No stridor. No respiratory distress. She has no wheezes. She has no rales.  Abdominal: Soft. Bowel sounds are normal. She exhibits no distension. There is tenderness (generalized worse in LLQ and RLQ.). There is rebound and guarding.  Genitourinary:  Rectal exam reveals no gross blood. Internal hemorrhoids without prolapse. No external hemorrhoids. Musculoskeletal: No joint deformities, erythema, or stiffness, ROM full and no nontender Ext: no edema and no cyanosis, pulses palpable bilaterally (DP and PT)  Hematology: no cervical, inginal, or axillary adenopathy.  Neurological: A&O x3, Strenght is normal and symmetric bilaterally, cranial nerve II-XII are grossly intact, no focal motor deficit,  sensory intact to light touch bilaterally.  Skin: Warm, dry and intact. No rash, cyanosis, or clubbing.  Psychiatric: Normal mood and affect. speech and behavior is normal. Judgment and thought content normal. Cognition and memory are normal.      Data Review   Micro Results No results found for this or any previous visit (from the past 240 hour(s)).  Radiology Reports Dg Chest 2 View  01/21/2015  CLINICAL DATA:  Shortness of breath for 2 months. Hypertension. Remote history of smoking. EXAM: CHEST  2 VIEW COMPARISON:  10/07/2014 FINDINGS: Cardiomegaly. Diffuse interstitial prominence throughout the lungs could reflect interstitial edema. No confluent opacity or effusion. No acute bony  abnormality. IMPRESSION: Cardiomegaly with diffuse interstitial prominence, likely interstitial edema. Electronically Signed   By: Rolm Baptise M.D.   On: 01/21/2015 12:49   Ct Abdomen Pelvis W Contrast  01/28/2015  CLINICAL DATA:  Right-sided abdominal pain for approximately 1 week. EXAM: CT ABDOMEN AND PELVIS WITH CONTRAST TECHNIQUE: Multidetector CT imaging of the abdomen and pelvis was performed using the standard protocol following bolus administration of intravenous contrast. CONTRAST:  48mL OMNIPAQUE IOHEXOL 300 MG/ML  SOLN COMPARISON:  None available. FINDINGS: Lower chest: Breathing motion artifact but no definite acute pulmonary process or pleural effusion. The heart is normal in size. No pericardial effusion. Coronary artery calcifications are noted. There is a small hiatal hernia. Hepatobiliary: No focal hepatic lesions or intrahepatic biliary dilatation. The gallbladder is normal. No common bile duct dilatation. Pancreas: No mass, inflammation or ductal dilatation. Spleen: Normal size.  No focal lesions. Adrenals/Urinary Tract: The adrenal glands are normal. There are moderate scarring changes involving the left kidney but no worrisome renal lesions, renal calculi or hydronephrosis. No obstructing ureteral calculi or bladder calculi. Stomach/Bowel: The stomach is grossly normal. The duodenum and small bowel are unremarkable. There is moderate inflammation involving the cecum with what appears to be a large cecal diverticulum fill with stool and acute diverticulitis. There is associated inflammation of the adjacent terminal ileum without functional obstruction. The appendix is visualized and is normal. Advanced sigmoid diverticulosis without findings for acute diverticulitis. Vascular/Lymphatic: The aorta and branch vessels are patent. Advanced atherosclerotic calcifications at the major branch vessel ostia and involving the distal aorta and iliac arteries. No mesenteric or retroperitoneal mass or  lymphadenopathy. Small scattered lymph nodes are noted. Other: The uterus is surgically absent. The ovaries are not visualized. The bladder appears normal. No pelvic mass or free pelvic fluid collections. No inguinal mass or adenopathy. Musculoskeletal: No significant bony findings. Degenerative changes and osteoporosis are noted. IMPRESSION: 1. Acute uncomplicated diverticulitis involving the cecum with a large inflamed cecal diverticulum measuring 3.4 cm. There is also adjacent inflammation of the terminal ileum. The appendix is normal. 2. Sigmoid diverticulosis without findings for acute diverticulitis. 3. Scarring changes involving the left kidney. 4. Atherosclerotic calcifications involving the aorta and iliac artery and major branch vessel ostia. 5. Small hiatal hernia. Electronically Signed   By: Marijo Sanes M.D.   On: 01/28/2015 20:11     CBC  Recent Labs Lab 01/22/15 0455 01/28/15 1335  WBC 8.7 14.5*  HGB 12.2 14.9  HCT 38.2 45.6  PLT 191 250  MCV 95.5 93.6  MCH 30.5 30.6  MCHC 31.9 32.7  RDW 13.0 13.1    Chemistries   Recent Labs Lab 01/22/15 0455 01/23/15 1445 01/24/15 0241 01/28/15 1335  NA 139 138 139 132*  K 3.1* 5.0 4.2 4.6  CL 100* 99* 101 98*  CO2 32  32 28 22  GLUCOSE 100* 142* 103* 110*  BUN 17 18 20 18   CREATININE 1.10* 1.09* 1.20* 1.16*  CALCIUM 9.1 9.5 9.3 9.5  AST  --   --   --  16  ALT  --   --   --  15  ALKPHOS  --   --   --  70  BILITOT  --   --   --  1.1   ------------------------------------------------------------------------------------------------------------------ estimated creatinine clearance is 48.5 mL/min (by C-G formula based on Cr of 1.16). ------------------------------------------------------------------------------------------------------------------ No results for input(s): HGBA1C in the last 72 hours. ------------------------------------------------------------------------------------------------------------------ No results for  input(s): CHOL, HDL, LDLCALC, TRIG, CHOLHDL, LDLDIRECT in the last 72 hours. ------------------------------------------------------------------------------------------------------------------ No results for input(s): TSH, T4TOTAL, T3FREE, THYROIDAB in the last 72 hours.  Invalid input(s): FREET3 ------------------------------------------------------------------------------------------------------------------ No results for input(s): VITAMINB12, FOLATE, FERRITIN, TIBC, IRON, RETICCTPCT in the last 72 hours.  Coagulation profile No results for input(s): INR, PROTIME in the last 168 hours.  No results for input(s): DDIMER in the last 72 hours.  Cardiac Enzymes No results for input(s): CKMB, TROPONINI, MYOGLOBIN in the last 168 hours.  Invalid input(s): CK ------------------------------------------------------------------------------------------------------------------ Invalid input(s): POCBNP   CBG: No results for input(s): GLUCAP in the last 168 hours.     EKG: Independently reviewed.      Assessment/Plan Principal Problem: Acute diverticulitis-admit to telemetry, patient will be kept nothing by mouth, patient already received ciprofloxacin and Flagyl in the ER, will continue antibiotics, gentle hydration of the patient will be nothing by mouth and the patient received IV contrast. She did have some rectal bleeding which has been ongoing for several months according to the patient. She has mentioned it to her cardiologist and her PCP and she has been recommended to be continued on anticoagulation    Chronic atrial fibrillation (HCC)-continue xarelto in am if no evidence of hematochezia overnight, hemoglobin stable, patient states that she's had intermittent hematochezia over the last several weeks, she had a colonoscopy 3-4 years ago, she may need another colonoscopy if she continues to have intermittent hematochezia.Chads2vasc score is at least 4 (sex, age, HTN, CHF).     OSA  (obstructive sleep apnea)-we'll order CPAP    Morbid obesity (HCC)Body mass index is 41.97 kg/(m^2).    Chronic combined systolic and diastolic CHF, NYHA class 3 (HCC) resume Lasix prior to discharge, no signs of acute exacerbation.echo 09/25/14 demonstrated normal LVF (55-60%) with mild MR. .       Code Status:   full Family Communication: bedside Disposition Plan: admit   Total time spent 55 minutes.Greater than 50% of this time was spent in counseling, explanation of diagnosis, planning of further management, and coordination of care  [facility] Pager [phone]  If 7PM-7AM, please contact night-coverage [email or web address] Password [facility] 01/28/2015, 9:47 PM

## 2015-01-28 NOTE — Progress Notes (Signed)
ANTIBIOTIC CONSULT NOTE - INITIAL  Pharmacy Consult for Zosyn Indication: intra-abdominal infection  No Known Allergies  Patient Measurements:   Adjusted Body Weight:   Vital Signs: Temp: 98.1 F (36.7 C) (11/08 1332) Temp Source: Oral (11/08 1332) BP: 101/76 mmHg (11/08 2030) Pulse Rate: 104 (11/08 2030) Intake/Output from previous day:   Intake/Output from this shift:    Labs:  Recent Labs  01/28/15 1335  WBC 14.5*  HGB 14.9  PLT 250  CREATININE 1.16*   Estimated Creatinine Clearance: 48.5 mL/min (by C-G formula based on Cr of 1.16). No results for input(s): VANCOTROUGH, VANCOPEAK, VANCORANDOM, GENTTROUGH, GENTPEAK, GENTRANDOM, TOBRATROUGH, TOBRAPEAK, TOBRARND, AMIKACINPEAK, AMIKACINTROU, AMIKACIN in the last 72 hours.   Microbiology: No results found for this or any previous visit (from the past 720 hour(s)).  Medical History: Past Medical History  Diagnosis Date  . HPV (human papilloma virus) anogenital infection     03/21/12 PAP + HR HPV  . Abnormal Pap smear of vagina and vaginal HPV   . Hypertension   . Obesity   . Chronic anticoagulation     She is on Xarelto for Afib  . Chronic atrial fibrillation ([place])   . Echocardiogram abnormal     Mitral regurgitation, tricuspic regurgitation  . Urolithiasis   . Postoperative nausea and vomiting   . CHF (congestive heart failure) ([place])   . Insulin resistance   . History of stress test     Myoview 8/16: EF 42%, anteroseptal, inferoseptal, anterior, apical anterior and apical defect (likely represents breast attenuation versus scar), no ischemia; intermediate risk because of low EF  . Kidney stones   . Vulvar cancer ([place]) 2016    invasive verrucous carcinoma of the vulva. History of genital warts and vulvar condyloma  . COPD (chronic obstructive pulmonary disease) ([place])     "pulmonary drs said I don't have COPD fall 2016" (01/23/2015)  . OSA on CPAP   . GERD (gastroesophageal reflux disease)   . Arthritis    "bones ache" (01/23/2015)    Medications:   (Not in a hospital admission) Scheduled:  .  morphine injection  4 mg Intravenous Once   Infusions:  . ciprofloxacin    . metronidazole 500 mg (01/28/15 2109)   Assessment: 69yo female presents with abdominal pain. Pharmacy is consulted to dose zosyn for suspected intra-abdominal infection. Pt is afebrile, WBC 14.5, sCr 1.16.  Goal of Therapy:  Eradication of infection  Plan:  Zosyn 3.375g IV q8h Follow up culture results, renal function and clinical course  Andrey Cota. Diona Foley, PharmD Clinical Pharmacist Pager [phone] 01/28/2015,9:31 PM

## 2015-01-29 DIAGNOSIS — K5733 Diverticulitis of large intestine without perforation or abscess with bleeding: Secondary | ICD-10-CM | POA: Diagnosis not present

## 2015-01-29 LAB — BASIC METABOLIC PANEL
Anion gap: 9 (ref 5–15)
BUN: 14 mg/dL (ref 6–20)
CALCIUM: 8.7 mg/dL — AB (ref 8.9–10.3)
CO2: 25 mmol/L (ref 22–32)
Chloride: 103 mmol/L (ref 101–111)
Creatinine, Ser: 1.14 mg/dL — ABNORMAL HIGH (ref 0.44–1.00)
GFR calc Af Amer: 55 mL/min — ABNORMAL LOW (ref >60)
GFR, EST NON AFRICAN AMERICAN: 47 mL/min — AB (ref >60)
GLUCOSE: 113 mg/dL — AB (ref 65–99)
Potassium: 4.1 mmol/L (ref 3.5–5.1)
SODIUM: 137 mmol/L (ref 135–145)

## 2015-01-29 LAB — COMPREHENSIVE METABOLIC PANEL
ALBUMIN: 2.7 g/dL — AB (ref 3.5–5.0)
ALK PHOS: 52 U/L (ref 38–126)
ALT: 14 U/L (ref 14–54)
ANION GAP: 8 (ref 5–15)
AST: 13 U/L — ABNORMAL LOW (ref 15–41)
BILIRUBIN TOTAL: 1.1 mg/dL (ref 0.3–1.2)
BUN: 15 mg/dL (ref 6–20)
CALCIUM: 8.8 mg/dL — AB (ref 8.9–10.3)
CO2: 27 mmol/L (ref 22–32)
Chloride: 102 mmol/L (ref 101–111)
Creatinine, Ser: 1.11 mg/dL — ABNORMAL HIGH (ref 0.44–1.00)
GFR calc Af Amer: 57 mL/min — ABNORMAL LOW (ref >60)
GFR, EST NON AFRICAN AMERICAN: 49 mL/min — AB (ref >60)
GLUCOSE: 121 mg/dL — AB (ref 65–99)
Potassium: 4 mmol/L (ref 3.5–5.1)
Sodium: 137 mmol/L (ref 135–145)
TOTAL PROTEIN: 6.3 g/dL — AB (ref 6.5–8.1)

## 2015-01-29 LAB — CBC
HCT: 38.2 % (ref 36.0–46.0)
HEMOGLOBIN: 12.1 g/dL (ref 12.0–15.0)
MCH: 30.4 pg (ref 26.0–34.0)
MCHC: 31.7 g/dL (ref 30.0–36.0)
MCV: 96 fL (ref 78.0–100.0)
Platelets: 214 10*3/uL (ref 150–400)
RBC: 3.98 MIL/uL (ref 3.87–5.11)
RDW: 13.3 % (ref 11.5–15.5)
WBC: 10.4 10*3/uL (ref 4.0–10.5)

## 2015-01-29 LAB — TROPONIN I

## 2015-01-29 LAB — TSH: TSH: 1.023 u[IU]/mL (ref 0.350–4.500)

## 2015-01-29 MED ORDER — ALBUTEROL SULFATE (2.5 MG/3ML) 0.083% IN NEBU: 2.5000 mg | INHALATION_SOLUTION | RESPIRATORY_TRACT | Status: DC | PRN

## 2015-01-29 NOTE — Progress Notes (Signed)
[facility] PROGRESS NOTE  Holly Smith KTG:[id number] DOB: 1945/11/13 DOA: 01/28/2015 PCP: Otilio Miu, MD  Length of stay: 1   Assessment/Plan: Principal Problem:   Diverticulitis large intestine w/o perforation or abscess w/bleeding Active Problems:   Chronic atrial fibrillation (HCC)   OSA (obstructive sleep apnea)   Morbid obesity (HCC)   Chronic combined systolic and diastolic CHF, NYHA class 3 (HCC)   Acute diverticulitis    HPI:  69 yr old female with a hx of probable tachycardia mediated cardiomyopathy, systolic HF and persistent/chronic AFib s/p prior DCCV, HTN, HL, OSA on CPAP, recently admitted on 11/1-11/4 with acute on chronic diastolic heart failure in the setting of her A. fib obesity and chronic diastolic heart failure, whopresented to ED with abdominal pain which the patient thought was due to constipation.symptoms started prior to her discharge but the patient did not report them. Symptoms worsened after discharge, with worsening abdominal pain with blood in stools and intermittent hematochezia.Pain located in the RLQ and LLQ. Severity moderate. Patient states that she has noted intermittent hematochezia for several weeks and she has mentioned it to a cardiologist and has been recommended to continue with anticoagulation because the bleeding is not severe. Denies N/V/D. Pt denies fever. UA negative. CT showed Acute uncomplicated diverticulitis involving the cecum with a large inflamed cecal diverticulum measuring 3.4 cm.   Assessment and plan Acute diverticulitis-cont  telemetry, advance diet to clear liquids,   will continue antibiotics, denies rectal bleeding overnight  But  has had intermittent bleeding   for several months according to the patient. She has mentioned it to her cardiologist and her PCP and she has been recommended to be continued on anticoagulation, resume anticoagulation, dc enteric precautions as no diarrhea     Chronic atrial  fibrillation (HCC)-continue xarelto, no evidence of hematochezia overnight, hemoglobin stable, patient states that she's had intermittent hematochezia over the last several weeks, she had a colonoscopy 3-4 years ago, she may need another colonoscopy if she continues to have intermittent hematochezia.Chads2vasc score is at least 4 (sex, age, HTN, CHF).    OSA (obstructive sleep apnea)-we'll order CPAP   Morbid obesity (HCC)Body mass index is 41.97 kg/(m^2).   Chronic combined systolic and diastolic CHF, NYHA class 3 (HCC) resume Lasix prior to discharge, no signs of acute exacerbation.echo 09/25/14 demonstrated normal LVF (55-60%) with mild MR. . DC IVF     DVT prophylaxsis   Code Status:      Code Status Orders        Start     Ordered   01/28/15 2134  Full code   Continuous     01/28/15 2135     Family Communication: family updated about patient's clinical progress Disposition Plan:  1-2 days     Consultants:  None   Procedures: None   Antibiotics: Anti-infectives    Start     Dose/Rate Route Frequency Ordered Stop   01/29/15 0400  piperacillin-tazobactam (ZOSYN) IVPB 3.375 g     3.375 g 12.5 mL/hr over 240 Minutes Intravenous Every 8 hours 01/28/15 2134     01/28/15 2145  piperacillin-tazobactam (ZOSYN) IVPB 3.375 g     3.375 g 100 mL/hr over 30 Minutes Intravenous  Once 01/28/15 2133 01/28/15 2327   01/28/15 2100  metroNIDAZOLE (FLAGYL) IVPB 500 mg     500 mg 100 mL/hr over 60 Minutes Intravenous  Once 01/28/15 2059 01/28/15 2209   01/28/15 2100  ciprofloxacin (CIPRO) IVPB 400 mg  Status:  Discontinued     400 mg 200 mL/hr over 60 Minutes Intravenous  Once 01/28/15 2059 01/28/15 2133         HPI/Subjective: No rectal bleeding , no nausea , or vomiting   Objective: Filed Vitals:   01/28/15 2340 01/29/15 0130 01/29/15 0430 01/29/15 0522  BP: 98/62  97/63   Pulse: 102  104   Temp: 98.1 F (36.7 C)  97.9 F (36.6 C)   TempSrc: Oral  Oral   Resp:  18  18   Height:      Weight:    101.17 kg (223 lb 0.6 oz)  SpO2: 97% 97% 94%     Intake/Output Summary (Last 24 hours) at 01/29/15 2831 Last data filed at 01/29/15 0210  Gross per 24 hour  Intake      0 ml  Output    400 ml  Net   -400 ml    Exam:  General: No acute respiratory distress Lungs: Clear to auscultation bilaterally without wheezes or crackles Cardiovascular: Regular rate and rhythm without murmur gallop or rub normal S1 and S2 Abdomen: Nontender, nondistended, soft, bowel sounds positive, no rebound, no ascites, no appreciable mass Extremities: No significant cyanosis, clubbing, or edema bilateral lower extremities     Data Review   Micro Results No results found for this or any previous visit (from the past 240 hour(s)).  Radiology Reports Dg Chest 2 View  01/21/2015  CLINICAL DATA:  Shortness of breath for 2 months. Hypertension. Remote history of smoking. EXAM: CHEST  2 VIEW COMPARISON:  10/07/2014 FINDINGS: Cardiomegaly. Diffuse interstitial prominence throughout the lungs could reflect interstitial edema. No confluent opacity or effusion. No acute bony abnormality. IMPRESSION: Cardiomegaly with diffuse interstitial prominence, likely interstitial edema. Electronically Signed   By: Rolm Baptise M.D.   On: 01/21/2015 12:49   Ct Abdomen Pelvis W Contrast  01/28/2015  CLINICAL DATA:  Right-sided abdominal pain for approximately 1 week. EXAM: CT ABDOMEN AND PELVIS WITH CONTRAST TECHNIQUE: Multidetector CT imaging of the abdomen and pelvis was performed using the standard protocol following bolus administration of intravenous contrast. CONTRAST:  35mL OMNIPAQUE IOHEXOL 300 MG/ML  SOLN COMPARISON:  None available. FINDINGS: Lower chest: Breathing motion artifact but no definite acute pulmonary process or pleural effusion. The heart is normal in size. No pericardial effusion. Coronary artery calcifications are noted. There is a small hiatal hernia. Hepatobiliary: No focal  hepatic lesions or intrahepatic biliary dilatation. The gallbladder is normal. No common bile duct dilatation. Pancreas: No mass, inflammation or ductal dilatation. Spleen: Normal size.  No focal lesions. Adrenals/Urinary Tract: The adrenal glands are normal. There are moderate scarring changes involving the left kidney but no worrisome renal lesions, renal calculi or hydronephrosis. No obstructing ureteral calculi or bladder calculi. Stomach/Bowel: The stomach is grossly normal. The duodenum and small bowel are unremarkable. There is moderate inflammation involving the cecum with what appears to be a large cecal diverticulum fill with stool and acute diverticulitis. There is associated inflammation of the adjacent terminal ileum without functional obstruction. The appendix is visualized and is normal. Advanced sigmoid diverticulosis without findings for acute diverticulitis. Vascular/Lymphatic: The aorta and branch vessels are patent. Advanced atherosclerotic calcifications at the major branch vessel ostia and involving the distal aorta and iliac arteries. No mesenteric or retroperitoneal mass or lymphadenopathy. Small scattered lymph nodes are noted. Other: The uterus is surgically absent. The ovaries are not visualized. The bladder appears normal. No pelvic mass or free pelvic fluid collections. No  inguinal mass or adenopathy. Musculoskeletal: No significant bony findings. Degenerative changes and osteoporosis are noted. IMPRESSION: 1. Acute uncomplicated diverticulitis involving the cecum with a large inflamed cecal diverticulum measuring 3.4 cm. There is also adjacent inflammation of the terminal ileum. The appendix is normal. 2. Sigmoid diverticulosis without findings for acute diverticulitis. 3. Scarring changes involving the left kidney. 4. Atherosclerotic calcifications involving the aorta and iliac artery and major branch vessel ostia. 5. Small hiatal hernia. Electronically Signed   By: Marijo Sanes M.D.    On: 01/28/2015 20:11     CBC  Recent Labs Lab 01/28/15 1335 01/29/15 0447  WBC 14.5* 10.4  HGB 14.9 12.1  HCT 45.6 38.2  PLT 250 214  MCV 93.6 96.0  MCH 30.6 30.4  MCHC 32.7 31.7  RDW 13.1 13.3    Chemistries   Recent Labs Lab 01/23/15 1445 01/24/15 0241 01/28/15 1335 01/28/15 2203 01/29/15 0447  NA 138 139 132*  --  137  K 5.0 4.2 4.6  --  4.0  CL 99* 101 98*  --  102  CO2 32 28 22  --  27  GLUCOSE 142* 103* 110*  --  121*  BUN 18 20 18   --  15  CREATININE 1.09* 1.20* 1.16*  --  1.11*  CALCIUM 9.5 9.3 9.5  --  8.8*  MG  --   --   --  2.1  --   AST  --   --  16  --  13*  ALT  --   --  15  --  14  ALKPHOS  --   --  70  --  52  BILITOT  --   --  1.1  --  1.1   ------------------------------------------------------------------------------------------------------------------ estimated creatinine clearance is 50.8 mL/min (by C-G formula based on Cr of 1.11). ------------------------------------------------------------------------------------------------------------------ No results for input(s): HGBA1C in the last 72 hours. ------------------------------------------------------------------------------------------------------------------ No results for input(s): CHOL, HDL, LDLCALC, TRIG, CHOLHDL, LDLDIRECT in the last 72 hours. ------------------------------------------------------------------------------------------------------------------  Recent Labs  01/29/15 0447  TSH 1.023   ------------------------------------------------------------------------------------------------------------------ No results for input(s): VITAMINB12, FOLATE, FERRITIN, TIBC, IRON, RETICCTPCT in the last 72 hours.  Coagulation profile No results for input(s): INR, PROTIME in the last 168 hours.  No results for input(s): DDIMER in the last 72 hours.  Cardiac Enzymes  Recent Labs Lab 01/28/15 2203 01/29/15 0447  TROPONINI 0.05* <0.03    ------------------------------------------------------------------------------------------------------------------ Invalid input(s): POCBNP   CBG: No results for input(s): GLUCAP in the last 168 hours.     Studies: Ct Abdomen Pelvis W Contrast  01/28/2015  CLINICAL DATA:  Right-sided abdominal pain for approximately 1 week. EXAM: CT ABDOMEN AND PELVIS WITH CONTRAST TECHNIQUE: Multidetector CT imaging of the abdomen and pelvis was performed using the standard protocol following bolus administration of intravenous contrast. CONTRAST:  75mL OMNIPAQUE IOHEXOL 300 MG/ML  SOLN COMPARISON:  None available. FINDINGS: Lower chest: Breathing motion artifact but no definite acute pulmonary process or pleural effusion. The heart is normal in size. No pericardial effusion. Coronary artery calcifications are noted. There is a small hiatal hernia. Hepatobiliary: No focal hepatic lesions or intrahepatic biliary dilatation. The gallbladder is normal. No common bile duct dilatation. Pancreas: No mass, inflammation or ductal dilatation. Spleen: Normal size.  No focal lesions. Adrenals/Urinary Tract: The adrenal glands are normal. There are moderate scarring changes involving the left kidney but no worrisome renal lesions, renal calculi or hydronephrosis. No obstructing ureteral calculi or bladder calculi. Stomach/Bowel: The stomach is grossly normal. The duodenum and  small bowel are unremarkable. There is moderate inflammation involving the cecum with what appears to be a large cecal diverticulum fill with stool and acute diverticulitis. There is associated inflammation of the adjacent terminal ileum without functional obstruction. The appendix is visualized and is normal. Advanced sigmoid diverticulosis without findings for acute diverticulitis. Vascular/Lymphatic: The aorta and branch vessels are patent. Advanced atherosclerotic calcifications at the major branch vessel ostia and involving the distal aorta and iliac  arteries. No mesenteric or retroperitoneal mass or lymphadenopathy. Small scattered lymph nodes are noted. Other: The uterus is surgically absent. The ovaries are not visualized. The bladder appears normal. No pelvic mass or free pelvic fluid collections. No inguinal mass or adenopathy. Musculoskeletal: No significant bony findings. Degenerative changes and osteoporosis are noted. IMPRESSION: 1. Acute uncomplicated diverticulitis involving the cecum with a large inflamed cecal diverticulum measuring 3.4 cm. There is also adjacent inflammation of the terminal ileum. The appendix is normal. 2. Sigmoid diverticulosis without findings for acute diverticulitis. 3. Scarring changes involving the left kidney. 4. Atherosclerotic calcifications involving the aorta and iliac artery and major branch vessel ostia. 5. Small hiatal hernia. Electronically Signed   By: Marijo Sanes M.D.   On: 01/28/2015 20:11      No results found for: HGBA1C Lab Results  Component Value Date   LDLCALC 70 01/22/2015   CREATININE 1.11* 01/29/2015       Scheduled Meds: . metoprolol  50 mg Oral BID  . pantoprazole  40 mg Oral Daily  . piperacillin-tazobactam (ZOSYN)  IV  3.375 g Intravenous Q8H  . rivaroxaban  20 mg Oral Q supper  . sodium chloride  3 mL Intravenous Q12H   Continuous Infusions: . sodium chloride 125 mL/hr at 01/29/15 0016    Principal Problem:   Diverticulitis large intestine w/o perforation or abscess w/bleeding Active Problems:   Chronic atrial fibrillation (HCC)   OSA (obstructive sleep apnea)   Morbid obesity (HCC)   Chronic combined systolic and diastolic CHF, NYHA class 3 ([place])   Acute diverticulitis    Time spent: 45 minutes   [facility] Pager [phone]. If 7PM-7AM, please contact night-coverage at [email or web address], password [email or web address] 01/29/2015, 9:09 AM  LOS: 1 day

## 2015-01-29 NOTE — Progress Notes (Signed)
The patient arrived to 3E12 from the ED at 1020.  She was oriented to the unit and placed on telemetry.  CCMD was notified.  She is A&Ox4 and a standby assist to the bathroom.  She states that her abdominal pain is a 0 at this time from receiving the morphine down in the ED.  Her call bell was placed within reach and her bed alarm was turned on.

## 2015-01-30 ENCOUNTER — Encounter (HOSPITAL_COMMUNITY): Payer: Self-pay | Admitting: General Surgery

## 2015-01-30 DIAGNOSIS — K5732 Diverticulitis of large intestine without perforation or abscess without bleeding: Secondary | ICD-10-CM | POA: Diagnosis not present

## 2015-01-30 DIAGNOSIS — K5733 Diverticulitis of large intestine without perforation or abscess with bleeding: Secondary | ICD-10-CM | POA: Diagnosis not present

## 2015-01-30 LAB — CBC
HCT: 39 % (ref 36.0–46.0)
HEMOGLOBIN: 12 g/dL (ref 12.0–15.0)
MCH: 29.9 pg (ref 26.0–34.0)
MCHC: 30.8 g/dL (ref 30.0–36.0)
MCV: 97.3 fL (ref 78.0–100.0)
PLATELETS: 215 10*3/uL (ref 150–400)
RBC: 4.01 MIL/uL (ref 3.87–5.11)
RDW: 13.2 % (ref 11.5–15.5)
WBC: 7 10*3/uL (ref 4.0–10.5)

## 2015-01-30 MED ORDER — OXYCODONE HCL 5 MG PO CAPS: 5.0000 mg | ORAL_CAPSULE | Freq: Four times a day (QID) | ORAL | Status: DC | PRN

## 2015-01-30 MED ORDER — FUROSEMIDE 40 MG PO TABS: 40.0000 mg | ORAL_TABLET | Freq: Every day | ORAL | Status: DC

## 2015-01-30 MED ORDER — CIPROFLOXACIN HCL 500 MG PO TABS: 500.0000 mg | ORAL_TABLET | Freq: Two times a day (BID) | ORAL | Status: DC

## 2015-01-30 MED ORDER — METRONIDAZOLE 500 MG PO TABS: 500.0000 mg | ORAL_TABLET | Freq: Three times a day (TID) | ORAL | Status: DC

## 2015-01-30 NOTE — Progress Notes (Signed)
[facility] PROGRESS NOTE  Holly Smith [id number] DOB: [birth year] DOA: 01/28/2015 PCP: Otilio Miu, MD  Length of stay: 2   Assessment/Plan: Principal Problem:   Diverticulitis large intestine w/o perforation or abscess w/bleeding Active Problems:   Chronic atrial fibrillation (HCC)   OSA (obstructive sleep apnea)   Morbid obesity (HCC)   Chronic combined systolic and diastolic CHF, NYHA class 3 (HCC)   Acute diverticulitis    HPI:  69 yr old female with a hx of probable tachycardia mediated cardiomyopathy, systolic HF and persistent/chronic AFib s/p prior DCCV, HTN, HL, OSA on CPAP, recently admitted on 11/1-11/4 with acute on chronic diastolic heart failure in the setting of her A. fib obesity and chronic diastolic heart failure, whopresented to ED with abdominal pain which the patient thought was due to constipation.symptoms started prior to her discharge but the patient did not report them. Symptoms worsened after discharge, with worsening abdominal pain with blood in stools and intermittent hematochezia.Pain located in the RLQ and LLQ. Severity moderate. Patient states that she has noted intermittent hematochezia for several weeks and she has mentioned it to a cardiologist and has been recommended to continue with anticoagulation because the bleeding is not severe. Denies N/V/D. Pt denies fever. UA negative. CT showed Acute uncomplicated diverticulitis involving the cecum with a large inflamed cecal diverticulum measuring 3.4 cm.   Assessment and plan Acute diverticulitis-cont  telemetry, advance diet to soft  ,   will continue antibiotics, denies rectal bleeding overnight  But  has had intermittent bleeding   for several months according to the patient. She has mentioned it to her cardiologist and her PCP and she has been recommended to be continued on anticoagulation, resume anticoagulation, dc enteric precautions as no diarrhea CCS consult  - recommend low  fiber diet ,follow up with her PCP for a referral for a colonoscopy in 6 weeks .patient would like to see how she tolerates soft  Diet and DC in am.     Chronic atrial fibrillation (HCC)-continue xarelto, no evidence of hematochezia overnight, hemoglobin stable, patient states that she's had intermittent hematochezia over the last several weeks, she had a colonoscopy 3-4 years ago, she may need another colonoscopy if she continues to have intermittent hematochezia.Chads2vasc score is at least 4 (sex, age, HTN, CHF).    OSA (obstructive sleep apnea)-we'll order CPAP   Morbid obesity (HCC)Body mass index is 41.97 kg/(m^2).   Chronic combined systolic and diastolic CHF, NYHA class 3 (HCC) resume Lasix prior to discharge, no signs of acute exacerbation.echo 09/25/14 demonstrated normal LVF (55-60%) with mild MR. . DC IVF     DVT prophylaxsis xarelto  Code Status:      Code Status Orders        Start     Ordered   01/28/15 2134  Full code   Continuous     01/28/15 2135     Family Communication: family updated about patient's clinical progress Disposition Plan:  1-2 days     Consultants:  None   Procedures: None   Antibiotics: Anti-infectives    Start     Dose/Rate Route Frequency Ordered Stop   01/30/15 0000  ciprofloxacin (CIPRO) 500 MG tablet     500 mg Oral 2 times daily 01/30/15 1117     01/30/15 0000  metroNIDAZOLE (FLAGYL) 500 MG tablet     500 mg Oral 3 times daily 01/30/15 1117     01/29/15 0400  piperacillin-tazobactam (ZOSYN) IVPB 3.375  g     3.375 g 12.5 mL/hr over 240 Minutes Intravenous Every 8 hours 01/28/15 2134     01/28/15 2145  piperacillin-tazobactam (ZOSYN) IVPB 3.375 g     3.375 g 100 mL/hr over 30 Minutes Intravenous  Once 01/28/15 2133 01/28/15 2327   01/28/15 2100  metroNIDAZOLE (FLAGYL) IVPB 500 mg     500 mg 100 mL/hr over 60 Minutes Intravenous  Once 01/28/15 2059 01/28/15 2209   01/28/15 2100  ciprofloxacin (CIPRO) IVPB 400 mg  Status:   Discontinued     400 mg 200 mL/hr over 60 Minutes Intravenous  Once 01/28/15 2059 01/28/15 2133         HPI/Subjective: No rectal bleeding , still has some LLL discomfort  Objective: Filed Vitals:   01/29/15 1300 01/29/15 2136 01/30/15 0657 01/30/15 1152  BP: 95/67 107/68 118/75 109/77  Pulse: 80 78 95 95  Temp: 97.6 F (36.4 C) 97.8 F (36.6 C) 97.4 F (36.3 C) 97.4 F (36.3 C)  TempSrc: Oral Oral Oral Oral  Resp: 18 17 18    Height:      Weight:   100.562 kg (221 lb 11.2 oz)   SpO2: 95% 96% 98% 97%    Intake/Output Summary (Last 24 hours) at 01/30/15 1335 Last data filed at 01/30/15 0636  Gross per 24 hour  Intake    817 ml  Output    850 ml  Net    -33 ml    Exam:  General: No acute respiratory distress Lungs: Clear to auscultation bilaterally without wheezes or crackles Cardiovascular: Regular rate and rhythm without murmur gallop or rub normal S1 and S2 Abdomen: Nontender, nondistended, soft, bowel sounds positive, no rebound, no ascites, no appreciable mass Extremities: No significant cyanosis, clubbing, or edema bilateral lower extremities     Data Review   Micro Results No results found for this or any previous visit (from the past 240 hour(s)).  Radiology Reports Dg Chest 2 View  01/21/2015  CLINICAL DATA:  Shortness of breath for 2 months. Hypertension. Remote history of smoking. EXAM: CHEST  2 VIEW COMPARISON:  10/07/2014 FINDINGS: Cardiomegaly. Diffuse interstitial prominence throughout the lungs could reflect interstitial edema. No confluent opacity or effusion. No acute bony abnormality. IMPRESSION: Cardiomegaly with diffuse interstitial prominence, likely interstitial edema. Electronically Signed   By: Rolm Baptise M.D.   On: 01/21/2015 12:49   Ct Abdomen Pelvis W Contrast  01/28/2015  CLINICAL DATA:  Right-sided abdominal pain for approximately 1 week. EXAM: CT ABDOMEN AND PELVIS WITH CONTRAST TECHNIQUE: Multidetector CT imaging of the abdomen  and pelvis was performed using the standard protocol following bolus administration of intravenous contrast. CONTRAST:  53mL OMNIPAQUE IOHEXOL 300 MG/ML  SOLN COMPARISON:  None available. FINDINGS: Lower chest: Breathing motion artifact but no definite acute pulmonary process or pleural effusion. The heart is normal in size. No pericardial effusion. Coronary artery calcifications are noted. There is a small hiatal hernia. Hepatobiliary: No focal hepatic lesions or intrahepatic biliary dilatation. The gallbladder is normal. No common bile duct dilatation. Pancreas: No mass, inflammation or ductal dilatation. Spleen: Normal size.  No focal lesions. Adrenals/Urinary Tract: The adrenal glands are normal. There are moderate scarring changes involving the left kidney but no worrisome renal lesions, renal calculi or hydronephrosis. No obstructing ureteral calculi or bladder calculi. Stomach/Bowel: The stomach is grossly normal. The duodenum and small bowel are unremarkable. There is moderate inflammation involving the cecum with what appears to be a large cecal diverticulum fill with stool and  acute diverticulitis. There is associated inflammation of the adjacent terminal ileum without functional obstruction. The appendix is visualized and is normal. Advanced sigmoid diverticulosis without findings for acute diverticulitis. Vascular/Lymphatic: The aorta and branch vessels are patent. Advanced atherosclerotic calcifications at the major branch vessel ostia and involving the distal aorta and iliac arteries. No mesenteric or retroperitoneal mass or lymphadenopathy. Small scattered lymph nodes are noted. Other: The uterus is surgically absent. The ovaries are not visualized. The bladder appears normal. No pelvic mass or free pelvic fluid collections. No inguinal mass or adenopathy. Musculoskeletal: No significant bony findings. Degenerative changes and osteoporosis are noted. IMPRESSION: 1. Acute uncomplicated diverticulitis  involving the cecum with a large inflamed cecal diverticulum measuring 3.4 cm. There is also adjacent inflammation of the terminal ileum. The appendix is normal. 2. Sigmoid diverticulosis without findings for acute diverticulitis. 3. Scarring changes involving the left kidney. 4. Atherosclerotic calcifications involving the aorta and iliac artery and major branch vessel ostia. 5. Small hiatal hernia. Electronically Signed   By: Marijo Sanes M.D.   On: 01/28/2015 20:11     CBC  Recent Labs Lab 01/28/15 1335 01/29/15 0447 01/30/15 0443  WBC 14.5* 10.4 7.0  HGB 14.9 12.1 12.0  HCT 45.6 38.2 39.0  PLT 250 214 215  MCV 93.6 96.0 97.3  MCH 30.6 30.4 29.9  MCHC 32.7 31.7 30.8  RDW 13.1 13.3 13.2    Chemistries   Recent Labs Lab 01/23/15 1445 01/24/15 0241 01/28/15 1335 01/28/15 2203 01/29/15 0447 01/29/15 0942  NA 138 139 132*  --  137 137  K 5.0 4.2 4.6  --  4.0 4.1  CL 99* 101 98*  --  102 103  CO2 32 28 22  --  27 25  GLUCOSE 142* 103* 110*  --  121* 113*  BUN 18 20 18   --  15 14  CREATININE 1.09* 1.20* 1.16*  --  1.11* 1.14*  CALCIUM 9.5 9.3 9.5  --  8.8* 8.7*  MG  --   --   --  2.1  --   --   AST  --   --  16  --  13*  --   ALT  --   --  15  --  14  --   ALKPHOS  --   --  70  --  52  --   BILITOT  --   --  1.1  --  1.1  --    ------------------------------------------------------------------------------------------------------------------ estimated creatinine clearance is 49.2 mL/min (by C-G formula based on Cr of 1.14). ------------------------------------------------------------------------------------------------------------------ No results for input(s): HGBA1C in the last 72 hours. ------------------------------------------------------------------------------------------------------------------ No results for input(s): CHOL, HDL, LDLCALC, TRIG, CHOLHDL, LDLDIRECT in the last 72  hours. ------------------------------------------------------------------------------------------------------------------  Recent Labs  01/29/15 0447  TSH 1.023   ------------------------------------------------------------------------------------------------------------------ No results for input(s): VITAMINB12, FOLATE, FERRITIN, TIBC, IRON, RETICCTPCT in the last 72 hours.  Coagulation profile No results for input(s): INR, PROTIME in the last 168 hours.  No results for input(s): DDIMER in the last 72 hours.  Cardiac Enzymes  Recent Labs Lab 01/28/15 2203 01/29/15 0447  TROPONINI 0.05* <0.03   ------------------------------------------------------------------------------------------------------------------ Invalid input(s): POCBNP   CBG: No results for input(s): GLUCAP in the last 168 hours.     Studies: Ct Abdomen Pelvis W Contrast  01/28/2015  CLINICAL DATA:  Right-sided abdominal pain for approximately 1 week. EXAM: CT ABDOMEN AND PELVIS WITH CONTRAST TECHNIQUE: Multidetector CT imaging of the abdomen and pelvis was performed using the standard protocol following  bolus administration of intravenous contrast. CONTRAST:  16mL OMNIPAQUE IOHEXOL 300 MG/ML  SOLN COMPARISON:  None available. FINDINGS: Lower chest: Breathing motion artifact but no definite acute pulmonary process or pleural effusion. The heart is normal in size. No pericardial effusion. Coronary artery calcifications are noted. There is a small hiatal hernia. Hepatobiliary: No focal hepatic lesions or intrahepatic biliary dilatation. The gallbladder is normal. No common bile duct dilatation. Pancreas: No mass, inflammation or ductal dilatation. Spleen: Normal size.  No focal lesions. Adrenals/Urinary Tract: The adrenal glands are normal. There are moderate scarring changes involving the left kidney but no worrisome renal lesions, renal calculi or hydronephrosis. No obstructing ureteral calculi or bladder calculi.  Stomach/Bowel: The stomach is grossly normal. The duodenum and small bowel are unremarkable. There is moderate inflammation involving the cecum with what appears to be a large cecal diverticulum fill with stool and acute diverticulitis. There is associated inflammation of the adjacent terminal ileum without functional obstruction. The appendix is visualized and is normal. Advanced sigmoid diverticulosis without findings for acute diverticulitis. Vascular/Lymphatic: The aorta and branch vessels are patent. Advanced atherosclerotic calcifications at the major branch vessel ostia and involving the distal aorta and iliac arteries. No mesenteric or retroperitoneal mass or lymphadenopathy. Small scattered lymph nodes are noted. Other: The uterus is surgically absent. The ovaries are not visualized. The bladder appears normal. No pelvic mass or free pelvic fluid collections. No inguinal mass or adenopathy. Musculoskeletal: No significant bony findings. Degenerative changes and osteoporosis are noted. IMPRESSION: 1. Acute uncomplicated diverticulitis involving the cecum with a large inflamed cecal diverticulum measuring 3.4 cm. There is also adjacent inflammation of the terminal ileum. The appendix is normal. 2. Sigmoid diverticulosis without findings for acute diverticulitis. 3. Scarring changes involving the left kidney. 4. Atherosclerotic calcifications involving the aorta and iliac artery and major branch vessel ostia. 5. Small hiatal hernia. Electronically Signed   By: Marijo Sanes M.D.   On: 01/28/2015 20:11      No results found for: HGBA1C Lab Results  Component Value Date   LDLCALC 70 01/22/2015   CREATININE 1.14* 01/29/2015       Scheduled Meds: . metoprolol  50 mg Oral BID  . pantoprazole  40 mg Oral Daily  . piperacillin-tazobactam (ZOSYN)  IV  3.375 g Intravenous Q8H  . rivaroxaban  20 mg Oral Q supper  . sodium chloride  3 mL Intravenous Q12H   Continuous Infusions:    Principal  Problem:   Diverticulitis large intestine w/o perforation or abscess w/bleeding Active Problems:   Chronic atrial fibrillation (HCC)   OSA (obstructive sleep apnea)   Morbid obesity (HCC)   Chronic combined systolic and diastolic CHF, NYHA class 3 ([place])   Acute diverticulitis    Time spent: 45 minutes   [facility] Pager [phone]. If 7PM-7AM, please contact night-coverage at [email or web address], password [facility] 01/30/2015, 1:35 PM  LOS: 2 days

## 2015-01-30 NOTE — Progress Notes (Signed)
Pt able to tolerate soft diet, good appetite, denies abd tenderness or nausea.  Edward Qualia RN

## 2015-01-30 NOTE — Consult Note (Signed)
Reason for Consult: uncomplicated diverticulitis  Referring Physician: Dr. Reyne Dumas PCP: Dr. Dot Lanes)   HPI: Holly Smith is a 69 year old female with a history of cardiomyopathy, sCHF, persistent chronic AFib, HTN, OSA who was discharged from the hospital on 01/24/15 after a CHF exacerbation.  She was re-admitted 4 days later with diverticulitis.  The patient reports gradual onset lower abdominal pain about 1 week ago, approximately around her discharge date, which progressively worsened.  Location is lower abdominal.  Moderate in severity.  Time pattern is constant.  No aggravating or alleviating factors. Associated with intermittent hematochezia.  Denies nausea, vomiting, fever, chills or sweats.  Denies previous symptoms.  She had a colonoscopy about 4-5 years ago, but does not recall results.  She was not aware of diverticulitis.  She denies changes in bowel pattern, weight loss.  Denies family history of colon cancer. She has a brother in his 45s with pancreatic cancer.  She was seen by her PCP and referred to the ED.  CT of A/P acute diverticulitis involving the cecum and diverticulosis to sigmoid colon.  The patient was admitted and placed on IV antibiotics.  WBC on presentation was 14k and now has normalized.  She has been afebrile with stable vital signs.  Her pain has significantly improved. Today, she is tolerating a soft diet and taking tylenol for pain.  We have been asked to evaluate for diverticulitis and to rule out an abscess.   Past Medical History  Diagnosis Date  . HPV (human papilloma virus) anogenital infection     03/21/12 PAP + HR HPV  . Abnormal Pap smear of vagina and vaginal HPV   . Hypertension   . Obesity   . Chronic anticoagulation     She is on Xarelto for Afib  . Chronic atrial fibrillation ([place])   . Echocardiogram abnormal     Mitral regurgitation, tricuspic regurgitation  . Urolithiasis   . Postoperative nausea and vomiting   . CHF  (congestive heart failure) ([place])   . Insulin resistance   . History of stress test     Myoview 8/16: EF 42%, anteroseptal, inferoseptal, anterior, apical anterior and apical defect (likely represents breast attenuation versus scar), no ischemia; intermediate risk because of low EF  . Kidney stones   . Vulvar cancer ([place]) 2016    invasive verrucous carcinoma of the vulva. History of genital warts and vulvar condyloma  . COPD (chronic obstructive pulmonary disease) ([place])     "pulmonary drs said I don't have COPD fall 2016" (01/23/2015)  . OSA on CPAP   . GERD (gastroesophageal reflux disease)   . Arthritis     "bones ache" (01/23/2015)    Past Surgical History  Procedure Laterality Date  . Vulvectomy Right 05/10/2014    Excisional biopsy of the superior right labial majora mass; [facility]  . Bilateral salpingoophorectomy  2003    benign ovarian cancer  . Lithotripsy    . Vulvectomy partial  07/02/2014    Re-excision and sentinel node dissection at [facility].   . Tonsillectomy    . Appendectomy    . Abdominal hysterectomy    . Cardioversion  2014-2015    "[place]"    Family History  Problem Relation Age of Onset  . Prostate cancer Brother 55    still living and well  . Heart attack Father   . Stroke Mother   . Diabetes Mother   . Hypertension Mother   . Heart attack Brother   .  Pancreatic cancer Brother   . Healthy Sister     Social History:  reports that she quit smoking about 2 years ago. Her smoking use included Cigarettes and E-cigarettes. She has a 50 pack-year smoking history. She has never used smokeless tobacco. She reports that she drinks alcohol. She reports that she does not use illicit drugs.  Allergies: No Known Allergies  Medications:  No current facility-administered medications on file prior to encounter.   Current Outpatient Prescriptions on File Prior to Encounter  Medication Sig Dispense Refill  . albuterol (PROVENTIL HFA;VENTOLIN HFA)  108 (90 BASE) MCG/ACT inhaler Inhale 2 puffs into the lungs every 8 (eight) hours as needed for wheezing or shortness of breath. 1 Inhaler 6  . fluticasone (FLONASE) 50 MCG/ACT nasal spray Place 1 spray into both nostrils 2 (two) times daily as needed for allergies or rhinitis.    Marland Kitchen irbesartan (AVAPRO) 75 MG tablet Take 37.5 mg by mouth daily.     . metoprolol (LOPRESSOR) 100 MG tablet Take 1 tablet (100 mg total) by mouth 2 (two) times daily. 60 tablet 11  . omeprazole (PRILOSEC) 20 MG capsule Take 20 mg by mouth daily before breakfast.    . rivaroxaban (XARELTO) 20 MG TABS tablet Take 20 mg by mouth daily with supper.        Results for orders placed or performed during the hospital encounter of 01/28/15 (from the past 48 hour(s))  Lipase, blood     Status: None   Collection Time: 01/28/15  1:35 PM  Result Value Ref Range   Lipase 29 11 - 51 U/L  Comprehensive metabolic panel     Status: Abnormal   Collection Time: 01/28/15  1:35 PM  Result Value Ref Range   Sodium 132 (L) 135 - 145 mmol/L   Potassium 4.6 3.5 - 5.1 mmol/L   Chloride 98 (L) 101 - 111 mmol/L   CO2 22 22 - 32 mmol/L   Glucose, Bld 110 (H) 65 - 99 mg/dL   BUN 18 6 - 20 mg/dL   Creatinine, Ser 1.16 (H) 0.44 - 1.00 mg/dL   Calcium 9.5 8.9 - 10.3 mg/dL   Total Protein 8.1 6.5 - 8.1 g/dL   Albumin 3.6 3.5 - 5.0 g/dL   AST 16 15 - 41 U/L   ALT 15 14 - 54 U/L   Alkaline Phosphatase 70 38 - 126 U/L   Total Bilirubin 1.1 0.3 - 1.2 mg/dL   GFR calc non Af Amer 46 (L) >60 mL/min   GFR calc Af Amer 54 (L) >60 mL/min    Comment: (NOTE) The eGFR has been calculated using the CKD EPI equation. This calculation has not been validated in all clinical situations. eGFR's persistently <60 mL/min signify possible Chronic Kidney Disease.    Anion gap 12 5 - 15  CBC     Status: Abnormal   Collection Time: 01/28/15  1:35 PM  Result Value Ref Range   WBC 14.5 (H) 4.0 - 10.5 K/uL   RBC 4.87 3.87 - 5.11 MIL/uL   Hemoglobin 14.9 12.0  - 15.0 g/dL   HCT 45.6 36.0 - 46.0 %   MCV 93.6 78.0 - 100.0 fL   MCH 30.6 26.0 - 34.0 pg   MCHC 32.7 30.0 - 36.0 g/dL   RDW 13.1 11.5 - 15.5 %   Platelets 250 150 - 400 K/uL  Type and screen [place]     Status: None   Collection Time: 01/28/15  1:39 PM  Result Value Ref Range   ABO/RH(D) O NEG    Antibody Screen NEG    Sample Expiration 01/31/2015   Urinalysis, Routine w reflex microscopic (not at [facility])     Status: Abnormal   Collection Time: 01/28/15  8:58 PM  Result Value Ref Range   Color, Urine YELLOW YELLOW   APPearance CLEAR CLEAR   Specific Gravity, Urine 1.027 1.005 - 1.030   pH 5.0 5.0 - 8.0   Glucose, UA NEGATIVE NEGATIVE mg/dL   Hgb urine dipstick MODERATE (A) NEGATIVE   Bilirubin Urine NEGATIVE NEGATIVE   Ketones, ur NEGATIVE NEGATIVE mg/dL   Protein, ur NEGATIVE NEGATIVE mg/dL   Urobilinogen, UA 1.0 0.0 - 1.0 mg/dL   Nitrite POSITIVE (A) NEGATIVE   Leukocytes, UA NEGATIVE NEGATIVE  Urine microscopic-add on     Status: Abnormal   Collection Time: 01/28/15  8:58 PM  Result Value Ref Range   Squamous Epithelial / LPF FEW (A) RARE   WBC, UA 0-2 <3 WBC/hpf   RBC / HPF 3-6 <3 RBC/hpf   Bacteria, UA MANY (A) RARE   Casts HYALINE CASTS (A) NEGATIVE  Magnesium     Status: None   Collection Time: 01/28/15 10:03 PM  Result Value Ref Range   Magnesium 2.1 1.7 - 2.4 mg/dL  Troponin I     Status: Abnormal   Collection Time: 01/28/15 10:03 PM  Result Value Ref Range   Troponin I 0.05 (H) <0.031 ng/mL    Comment:        PERSISTENTLY INCREASED TROPONIN VALUES IN THE RANGE OF 0.04-0.49 ng/mL CAN BE SEEN IN:       -UNSTABLE ANGINA       -CONGESTIVE HEART FAILURE       -MYOCARDITIS       -CHEST TRAUMA       -ARRYHTHMIAS       -LATE PRESENTING MYOCARDIAL INFARCTION       -COPD   CLINICAL FOLLOW-UP RECOMMENDED.   Comprehensive metabolic panel     Status: Abnormal   Collection Time: 01/29/15  4:47 AM  Result Value Ref Range   Sodium 137 135 -  145 mmol/L   Potassium 4.0 3.5 - 5.1 mmol/L   Chloride 102 101 - 111 mmol/L   CO2 27 22 - 32 mmol/L   Glucose, Bld 121 (H) 65 - 99 mg/dL   BUN 15 6 - 20 mg/dL   Creatinine, Ser 1.11 (H) 0.44 - 1.00 mg/dL   Calcium 8.8 (L) 8.9 - 10.3 mg/dL   Total Protein 6.3 (L) 6.5 - 8.1 g/dL   Albumin 2.7 (L) 3.5 - 5.0 g/dL   AST 13 (L) 15 - 41 U/L   ALT 14 14 - 54 U/L   Alkaline Phosphatase 52 38 - 126 U/L   Total Bilirubin 1.1 0.3 - 1.2 mg/dL   GFR calc non Af Amer 49 (L) >60 mL/min   GFR calc Af Amer 57 (L) >60 mL/min    Comment: (NOTE) The eGFR has been calculated using the CKD EPI equation. This calculation has not been validated in all clinical situations. eGFR's persistently <60 mL/min signify possible Chronic Kidney Disease.    Anion gap 8 5 - 15  CBC     Status: None   Collection Time: 01/29/15  4:47 AM  Result Value Ref Range   WBC 10.4 4.0 - 10.5 K/uL   RBC 3.98 3.87 - 5.11 MIL/uL   Hemoglobin 12.1 12.0 - 15.0 g/dL   HCT 38.2 36.0 -  46.0 %   MCV 96.0 78.0 - 100.0 fL   MCH 30.4 26.0 - 34.0 pg   MCHC 31.7 30.0 - 36.0 g/dL   RDW 13.3 11.5 - 15.5 %   Platelets 214 150 - 400 K/uL  TSH     Status: None   Collection Time: 01/29/15  4:47 AM  Result Value Ref Range   TSH 1.023 0.350 - 4.500 uIU/mL  Troponin I     Status: None   Collection Time: 01/29/15  4:47 AM  Result Value Ref Range   Troponin I <0.03 <0.031 ng/mL    Comment:        NO INDICATION OF MYOCARDIAL INJURY.   Basic metabolic panel     Status: Abnormal   Collection Time: 01/29/15  9:42 AM  Result Value Ref Range   Sodium 137 135 - 145 mmol/L   Potassium 4.1 3.5 - 5.1 mmol/L   Chloride 103 101 - 111 mmol/L   CO2 25 22 - 32 mmol/L   Glucose, Bld 113 (H) 65 - 99 mg/dL   BUN 14 6 - 20 mg/dL   Creatinine, Ser 1.14 (H) 0.44 - 1.00 mg/dL   Calcium 8.7 (L) 8.9 - 10.3 mg/dL   GFR calc non Af Amer 47 (L) >60 mL/min   GFR calc Af Amer 55 (L) >60 mL/min    Comment: (NOTE) The eGFR has been calculated using the CKD EPI  equation. This calculation has not been validated in all clinical situations. eGFR's persistently <60 mL/min signify possible Chronic Kidney Disease.    Anion gap 9 5 - 15  CBC     Status: None   Collection Time: 01/30/15  4:43 AM  Result Value Ref Range   WBC 7.0 4.0 - 10.5 K/uL   RBC 4.01 3.87 - 5.11 MIL/uL   Hemoglobin 12.0 12.0 - 15.0 g/dL   HCT 39.0 36.0 - 46.0 %   MCV 97.3 78.0 - 100.0 fL   MCH 29.9 26.0 - 34.0 pg   MCHC 30.8 30.0 - 36.0 g/dL   RDW 13.2 11.5 - 15.5 %   Platelets 215 150 - 400 K/uL    Ct Abdomen Pelvis W Contrast  01/28/2015  CLINICAL DATA:  Right-sided abdominal pain for approximately 1 week. EXAM: CT ABDOMEN AND PELVIS WITH CONTRAST TECHNIQUE: Multidetector CT imaging of the abdomen and pelvis was performed using the standard protocol following bolus administration of intravenous contrast. CONTRAST:  42m OMNIPAQUE IOHEXOL 300 MG/ML  SOLN COMPARISON:  None available. FINDINGS: Lower chest: Breathing motion artifact but no definite acute pulmonary process or pleural effusion. The heart is normal in size. No pericardial effusion. Coronary artery calcifications are noted. There is a small hiatal hernia. Hepatobiliary: No focal hepatic lesions or intrahepatic biliary dilatation. The gallbladder is normal. No common bile duct dilatation. Pancreas: No mass, inflammation or ductal dilatation. Spleen: Normal size.  No focal lesions. Adrenals/Urinary Tract: The adrenal glands are normal. There are moderate scarring changes involving the left kidney but no worrisome renal lesions, renal calculi or hydronephrosis. No obstructing ureteral calculi or bladder calculi. Stomach/Bowel: The stomach is grossly normal. The duodenum and small bowel are unremarkable. There is moderate inflammation involving the cecum with what appears to be a large cecal diverticulum fill with stool and acute diverticulitis. There is associated inflammation of the adjacent terminal ileum without functional  obstruction. The appendix is visualized and is normal. Advanced sigmoid diverticulosis without findings for acute diverticulitis. Vascular/Lymphatic: The aorta and branch vessels are patent. Advanced  atherosclerotic calcifications at the major branch vessel ostia and involving the distal aorta and iliac arteries. No mesenteric or retroperitoneal mass or lymphadenopathy. Small scattered lymph nodes are noted. Other: The uterus is surgically absent. The ovaries are not visualized. The bladder appears normal. No pelvic mass or free pelvic fluid collections. No inguinal mass or adenopathy. Musculoskeletal: No significant bony findings. Degenerative changes and osteoporosis are noted. IMPRESSION: 1. Acute uncomplicated diverticulitis involving the cecum with a large inflamed cecal diverticulum measuring 3.4 cm. There is also adjacent inflammation of the terminal ileum. The appendix is normal. 2. Sigmoid diverticulosis without findings for acute diverticulitis. 3. Scarring changes involving the left kidney. 4. Atherosclerotic calcifications involving the aorta and iliac artery and major branch vessel ostia. 5. Small hiatal hernia. Electronically Signed   By: Marijo Sanes M.D.   On: 01/28/2015 20:11    Review of Systems  Constitutional: Negative for fever, chills, weight loss, malaise/fatigue and diaphoresis.  Eyes: Negative for blurred vision, double vision and photophobia.  Respiratory: Negative for cough, hemoptysis, sputum production, shortness of breath and wheezing.   Cardiovascular: Negative for chest pain, palpitations, orthopnea, claudication, leg swelling and PND.  Gastrointestinal: Negative for heartburn, nausea, vomiting, abdominal pain, diarrhea, constipation, blood in stool and melena.  Genitourinary: Negative for dysuria, urgency, frequency, hematuria and flank pain.  Neurological: Negative for dizziness, tingling, focal weakness, seizures, loss of consciousness, weakness and headaches.   Psychiatric/Behavioral: Negative for depression, memory loss and substance abuse. The patient is not nervous/anxious.    Blood pressure 109/77, pulse 95, temperature 97.4 F (36.3 C), temperature source Oral, resp. rate 18, height '5\' 1"'  (1.549 m), weight 100.562 kg (221 lb 11.2 oz), SpO2 97 %. Physical Exam  Constitutional: She is oriented to person, place, and time.  GI: Soft. Bowel sounds are normal. She exhibits no distension and no mass. There is no rebound and no guarding.  Minimal ttp ruq  Neurological: She is alert and oriented to person, place, and time.  Skin: Skin is warm and dry. No rash noted. No erythema. No pallor.  Psychiatric: She has a normal mood and affect. Her behavior is normal. Judgment and thought content normal.    Assessment/Plan: Ms. Alcalde is a 69 year old female with a history of Afib on Xarelto, sCHF, cardiomyopathy, OSA, obesity, abdominal hysterectomy, oophorectomy and appendectomy in the 1980's.   Acute uncomplicated diverticulitis-the patient is tolerating a low fiber diet, non tender, afebrile with a normal white count.  I do not appreciate an abscess on radiologic imaging.  She is stable to discharge from a surgical standpoint with antibiotics to complete her course.  I encouraged her to consume a low fiber diet for the next few days and then advance to high fiber as tolerated.  Would recommend follow up with her PCP for a referral for a colonoscopy in 6 weeks.  She does not need to follow up in our office at this point.      Vonna Brabson ANP-BC 01/30/2015, 12:55 PM

## 2015-01-30 NOTE — Progress Notes (Signed)
Utilization review completed. Sylvanus Telford, RN, BSN. 

## 2015-01-31 ENCOUNTER — Inpatient Hospital Stay: Payer: Self-pay | Admitting: Family Medicine

## 2015-01-31 DIAGNOSIS — K5792 Diverticulitis of intestine, part unspecified, without perforation or abscess without bleeding: Secondary | ICD-10-CM | POA: Diagnosis present

## 2015-01-31 DIAGNOSIS — K5733 Diverticulitis of large intestine without perforation or abscess with bleeding: Secondary | ICD-10-CM | POA: Diagnosis not present

## 2015-01-31 LAB — COMPREHENSIVE METABOLIC PANEL
ALT: 12 U/L — ABNORMAL LOW (ref 14–54)
AST: 12 U/L — AB (ref 15–41)
Albumin: 2.8 g/dL — ABNORMAL LOW (ref 3.5–5.0)
Alkaline Phosphatase: 43 U/L (ref 38–126)
Anion gap: 7 (ref 5–15)
BILIRUBIN TOTAL: 0.5 mg/dL (ref 0.3–1.2)
BUN: 12 mg/dL (ref 6–20)
CHLORIDE: 107 mmol/L (ref 101–111)
CO2: 27 mmol/L (ref 22–32)
Calcium: 9.1 mg/dL (ref 8.9–10.3)
Creatinine, Ser: 1.16 mg/dL — ABNORMAL HIGH (ref 0.44–1.00)
GFR, EST AFRICAN AMERICAN: 54 mL/min — AB (ref >60)
GFR, EST NON AFRICAN AMERICAN: 46 mL/min — AB (ref >60)
Glucose, Bld: 89 mg/dL (ref 65–99)
POTASSIUM: 4.3 mmol/L (ref 3.5–5.1)
Sodium: 141 mmol/L (ref 135–145)
TOTAL PROTEIN: 6.6 g/dL (ref 6.5–8.1)

## 2015-01-31 LAB — CBC
HEMATOCRIT: 37.6 % (ref 36.0–46.0)
Hemoglobin: 11.8 g/dL — ABNORMAL LOW (ref 12.0–15.0)
MCH: 30.1 pg (ref 26.0–34.0)
MCHC: 31.4 g/dL (ref 30.0–36.0)
MCV: 95.9 fL (ref 78.0–100.0)
PLATELETS: 227 10*3/uL (ref 150–400)
RBC: 3.92 MIL/uL (ref 3.87–5.11)
RDW: 13 % (ref 11.5–15.5)
WBC: 6.7 10*3/uL (ref 4.0–10.5)

## 2015-01-31 MED ORDER — OXYCODONE HCL 5 MG PO CAPS: 5.0000 mg | ORAL_CAPSULE | Freq: Four times a day (QID) | ORAL | Status: DC | PRN

## 2015-01-31 MED ORDER — SACCHAROMYCES BOULARDII 250 MG PO CAPS: 250.0000 mg | ORAL_CAPSULE | Freq: Two times a day (BID) | ORAL | Status: DC

## 2015-01-31 MED ORDER — CIPROFLOXACIN HCL 500 MG PO TABS: 500.0000 mg | ORAL_TABLET | Freq: Two times a day (BID) | ORAL | Status: DC

## 2015-01-31 MED ORDER — METRONIDAZOLE 500 MG PO TABS: 500.0000 mg | ORAL_TABLET | Freq: Three times a day (TID) | ORAL | Status: DC

## 2015-01-31 NOTE — Progress Notes (Signed)
Pt d/c to home at 1406 .  Off floor via w/c to awaiting transport.  Karie Kirks, Therapist, sports.

## 2015-01-31 NOTE — Progress Notes (Signed)
All d/c instructions explained as given to pt.  Verbalized understanding.  Viliami Bracco, RN. 

## 2015-01-31 NOTE — Discharge Summary (Signed)
Physician Discharge Summary  Holly Smith MRN: [id number] DOB/AGE: Sep 11, 1945 69 y.o.  PCP: Otilio Miu, MD   Admit date: 01/28/2015 Discharge date: 01/31/2015  Discharge Diagnoses:     Principal Problem:   Diverticulitis large intestine w/o perforation or abscess w/bleeding Active Problems:   Chronic atrial fibrillation (HCC)   OSA (obstructive sleep apnea)   Morbid obesity (HCC)   Chronic combined systolic and diastolic CHF, NYHA class 3 ([place])   Acute diverticulitis    Follow-up recommendations Follow-up with PCP in 3-5 days , including all  additional recommended appointments as below Follow-up CBC, CMP in 3-5 days  Follow-up colonoscopy in 6 weeks      Medication List    TAKE these medications        albuterol 108 (90 BASE) MCG/ACT inhaler  Commonly known as:  PROVENTIL HFA;VENTOLIN HFA  Inhale 2 puffs into the lungs every 8 (eight) hours as needed for wheezing or shortness of breath.     ciprofloxacin 500 MG tablet  Commonly known as:  CIPRO  Take 1 tablet (500 mg total) by mouth 2 (two) times daily.     fluticasone 50 MCG/ACT nasal spray  Commonly known as:  FLONASE  Place 1 spray into both nostrils 2 (two) times daily as needed for allergies or rhinitis.     furosemide 40 MG tablet  Commonly known as:  LASIX  Take 1 tablet (40 mg total) by mouth daily.  Start taking on:  02/03/2015     irbesartan 75 MG tablet  Commonly known as:  AVAPRO  Take 37.5 mg by mouth daily.     metoprolol 100 MG tablet  Commonly known as:  LOPRESSOR  Take 1 tablet (100 mg total) by mouth 2 (two) times daily.     metroNIDAZOLE 500 MG tablet  Commonly known as:  FLAGYL  Take 1 tablet (500 mg total) by mouth 3 (three) times daily.     omeprazole 20 MG capsule  Commonly known as:  PRILOSEC  Take 20 mg by mouth daily before breakfast.     oxycodone 5 MG capsule  Commonly known as:  OXY-IR  Take 1 capsule (5 mg total) by mouth every 6 (six) hours as needed.     rivaroxaban 20 MG Tabs tablet  Commonly known as:  XARELTO  Take 20 mg by mouth daily with supper.     saccharomyces boulardii 250 MG capsule  Commonly known as:  FLORASTOR  Take 1 capsule (250 mg total) by mouth 2 (two) times daily.         Discharge Condition:   Discharge Instructions       Discharge Instructions    Diet - low sodium heart healthy    Complete by:  As directed      Diet - low sodium heart healthy    Complete by:  As directed      Increase activity slowly    Complete by:  As directed      Increase activity slowly    Complete by:  As directed            No Known Allergies    Disposition: 01-Home or Self Care   Consults: Gen. surgery     Significant Diagnostic Studies:  Dg Chest 2 View  01/21/2015  CLINICAL DATA:  Shortness of breath for 2 months. Hypertension. Remote history of smoking. EXAM: CHEST  2 VIEW COMPARISON:  10/07/2014 FINDINGS: Cardiomegaly. Diffuse interstitial prominence throughout the lungs could reflect interstitial edema. No  confluent opacity or effusion. No acute bony abnormality. IMPRESSION: Cardiomegaly with diffuse interstitial prominence, likely interstitial edema. Electronically Signed   By: Rolm Baptise M.D.   On: 01/21/2015 12:49   Ct Abdomen Pelvis W Contrast  01/28/2015  CLINICAL DATA:  Right-sided abdominal pain for approximately 1 week. EXAM: CT ABDOMEN AND PELVIS WITH CONTRAST TECHNIQUE: Multidetector CT imaging of the abdomen and pelvis was performed using the standard protocol following bolus administration of intravenous contrast. CONTRAST:  69m OMNIPAQUE IOHEXOL 300 MG/ML  SOLN COMPARISON:  None available. FINDINGS: Lower chest: Breathing motion artifact but no definite acute pulmonary process or pleural effusion. The heart is normal in size. No pericardial effusion. Coronary artery calcifications are noted. There is a small hiatal hernia. Hepatobiliary: No focal hepatic lesions or intrahepatic biliary dilatation. The  gallbladder is normal. No common bile duct dilatation. Pancreas: No mass, inflammation or ductal dilatation. Spleen: Normal size.  No focal lesions. Adrenals/Urinary Tract: The adrenal glands are normal. There are moderate scarring changes involving the left kidney but no worrisome renal lesions, renal calculi or hydronephrosis. No obstructing ureteral calculi or bladder calculi. Stomach/Bowel: The stomach is grossly normal. The duodenum and small bowel are unremarkable. There is moderate inflammation involving the cecum with what appears to be a large cecal diverticulum fill with stool and acute diverticulitis. There is associated inflammation of the adjacent terminal ileum without functional obstruction. The appendix is visualized and is normal. Advanced sigmoid diverticulosis without findings for acute diverticulitis. Vascular/Lymphatic: The aorta and branch vessels are patent. Advanced atherosclerotic calcifications at the major branch vessel ostia and involving the distal aorta and iliac arteries. No mesenteric or retroperitoneal mass or lymphadenopathy. Small scattered lymph nodes are noted. Other: The uterus is surgically absent. The ovaries are not visualized. The bladder appears normal. No pelvic mass or free pelvic fluid collections. No inguinal mass or adenopathy. Musculoskeletal: No significant bony findings. Degenerative changes and osteoporosis are noted. IMPRESSION: 1. Acute uncomplicated diverticulitis involving the cecum with a large inflamed cecal diverticulum measuring 3.4 cm. There is also adjacent inflammation of the terminal ileum. The appendix is normal. 2. Sigmoid diverticulosis without findings for acute diverticulitis. 3. Scarring changes involving the left kidney. 4. Atherosclerotic calcifications involving the aorta and iliac artery and major branch vessel ostia. 5. Small hiatal hernia. Electronically Signed   By: PMarijo SanesM.D.   On: 01/28/2015 20:11        Filed Weights    01/29/15 0522 01/30/15 0657 01/31/15 0530  Weight: 101.17 kg (223 lb 0.6 oz) 100.562 kg (221 lb 11.2 oz) 100.699 kg (222 lb)     Microbiology: No results found for this or any previous visit (from the past 240 hour(s)).     Blood Culture No results found for: SDES, SPECREQUEST, CULT, REPTSTATUS    Labs: Results for orders placed or performed during the hospital encounter of 01/28/15 (from the past 48 hour(s))  CBC     Status: None   Collection Time: 01/30/15  4:43 AM  Result Value Ref Range   WBC 7.0 4.0 - 10.5 K/uL   RBC 4.01 3.87 - 5.11 MIL/uL   Hemoglobin 12.0 12.0 - 15.0 g/dL   HCT 39.0 36.0 - 46.0 %   MCV 97.3 78.0 - 100.0 fL   MCH 29.9 26.0 - 34.0 pg   MCHC 30.8 30.0 - 36.0 g/dL   RDW 13.2 11.5 - 15.5 %   Platelets 215 150 - 400 K/uL  CBC     Status: Abnormal  Collection Time: 01/31/15  4:02 AM  Result Value Ref Range   WBC 6.7 4.0 - 10.5 K/uL   RBC 3.92 3.87 - 5.11 MIL/uL   Hemoglobin 11.8 (L) 12.0 - 15.0 g/dL   HCT 37.6 36.0 - 46.0 %   MCV 95.9 78.0 - 100.0 fL   MCH 30.1 26.0 - 34.0 pg   MCHC 31.4 30.0 - 36.0 g/dL   RDW 13.0 11.5 - 15.5 %   Platelets 227 150 - 400 K/uL  Comprehensive metabolic panel     Status: Abnormal   Collection Time: 01/31/15  4:02 AM  Result Value Ref Range   Sodium 141 135 - 145 mmol/L   Potassium 4.3 3.5 - 5.1 mmol/L   Chloride 107 101 - 111 mmol/L   CO2 27 22 - 32 mmol/L   Glucose, Bld 89 65 - 99 mg/dL   BUN 12 6 - 20 mg/dL   Creatinine, Ser 1.16 (H) 0.44 - 1.00 mg/dL   Calcium 9.1 8.9 - 10.3 mg/dL   Total Protein 6.6 6.5 - 8.1 g/dL   Albumin 2.8 (L) 3.5 - 5.0 g/dL   AST 12 (L) 15 - 41 U/L   ALT 12 (L) 14 - 54 U/L   Alkaline Phosphatase 43 38 - 126 U/L   Total Bilirubin 0.5 0.3 - 1.2 mg/dL   GFR calc non Af Amer 46 (L) >60 mL/min   GFR calc Af Amer 54 (L) >60 mL/min    Comment: (NOTE) The eGFR has been calculated using the CKD EPI equation. This calculation has not been validated in all clinical situations. eGFR's  persistently <60 mL/min signify possible Chronic Kidney Disease.    Anion gap 7 5 - 15     Lipid Panel     Component Value Date/Time   CHOL 118 01/22/2015 0455   TRIG 103 01/22/2015 0455   HDL 27* 01/22/2015 0455   CHOLHDL 4.4 01/22/2015 0455   VLDL 21 01/22/2015 0455   LDLCALC 70 01/22/2015 0455     No results found for: HGBA1C   Lab Results  Component Value Date   LDLCALC 70 01/22/2015   CREATININE 1.16* 01/31/2015     69 yr old female with a hx of probable tachycardia mediated cardiomyopathy, systolic HF and persistent/chronic AFib s/p prior DCCV, HTN, HL, OSA on CPAP, recently admitted on 11/1-11/4 with acute on chronic diastolic heart failure in the setting of her A. fib obesity and chronic diastolic heart failure, whopresented to ED with abdominal pain which the patient thought was due to constipation.symptoms started prior to her discharge but the patient did not report them. Symptoms worsened after discharge, with worsening abdominal pain with blood in stools and intermittent hematochezia.Pain located in the RLQ and LLQ. Severity moderate. Patient states that she has noted intermittent hematochezia for several weeks and she has mentioned it to a cardiologist and has been recommended to continue with anticoagulation because the bleeding is not severe. Denies N/V/D. Pt denies fever. UA negative. CT showed Acute uncomplicated diverticulitis involving the cecum with a large inflamed cecal diverticulum measuring 3.4 cm.   Assessment and plan Acute uncomplicated diverticulitis-patient was slowly advanced from being nothing by mouth to a soft diet, treated with Zosyn during this hospitalization, now switched to ciprofloxacin and Flagyl 10 days. White blood cell on admission was 14 K and now it has normalized. Patient also evaluated by surgeryand they do not have any further recommendations for this acute uncomplicated diverticulitis. However they do recommend a low fiber diet  and  a follow-up colonoscopy in 6 weeks. The patient has been advised to arrange for colonoscopy through her daughter's gastroenterologist. Patient did have some loose stools during this hospitalization after being started on antibiotic treatment and she is therefore being started on a probiotic . Patient tolerating soft diet at the time of discharge    Chronic atrial fibrillation (HCC)-continue xarelto, no evidence of hematochezia overnight, hemoglobin stable, patient states that she's had intermittent hematochezia over the last several weeks, she had a colonoscopy 3-4 years ago, she may need another colonoscopy if she continues to have intermittent hematochezia.Chads2vasc score is at least 4 (sex, age, HTN, CHF).    OSA (obstructive sleep apnea)-we'll order CPAP   Morbid obesity (HCC)Body mass index is 41.97 kg/(m^2).   Chronic combined systolic and diastolic CHF, NYHA class 3 (HCC) resume Lasix prior to discharge, no signs of acute exacerbation.echo 09/25/14 demonstrated normal LVF (55-60%) with mild MR. . DC IVF     Discharge Exam:   Blood pressure 116/76, pulse 103, temperature 97.9 F (36.6 C), temperature source Oral, resp. rate 18, height _0  (1.549 m), weight 100.699 kg (222 lb), SpO2 100 %.   General: No acute respiratory distress Lungs: Clear to auscultation bilaterally without wheezes or crackles Cardiovascular: Regular rate and rhythm without murmur gallop or rub normal S1 and S2 Abdomen: Nontender, nondistended, soft, bowel sounds positive, no rebound, no ascites, no appreciable mass Extremities: No significant cyanosis, clubbing, or edema bilateral lower extremities     Follow-up Information    Follow up with Otilio Miu, MD On 02/04/2015.   Specialty:  Family Medicine   Why:  @   11:00 am    .....Marland Kitchen confirmed w/ Dyann Kief information:   [address] [place] [address] [phone]       Follow up with Otilio Miu, MD.   Specialty:  [facility]  Medicine   Contact information:   [address] [place] [address] [phone]       Signed: Reyne Dumas 01/31/2015, 10:07 AM        Time spent >45 mins

## 2015-01-31 NOTE — Care Management Obs Status (Signed)
[facility] NOTIFICATION   Patient Details  Name: Holly Smith MRN: [id number] Date of Birth: Oct 24, 1945   Medicare Observation Status Notification Given:       Royston Bake, RN 01/31/2015, 1:26 PM

## 2015-01-31 NOTE — Care Management Important Message (Signed)
Important Message  Patient Details  Name: Holly Smith MRN: [id number] Date of Birth: [birth year]   Medicare Important Message Given:  Yes    Somaly Marteney P Christia Coaxum 01/31/2015, 2:20 PM

## 2015-02-02 NOTE — Assessment & Plan Note (Signed)
Keep up good work with CPAP .At bedtime   Goal is to wear for at least 6hr each night.  Do not drive if sleepy.  Work on weight loss.  Follow up with Cardiology as planned  Follow up  Dr. Alva in 4 months and As needed    

## 2015-02-04 ENCOUNTER — Ambulatory Visit (INDEPENDENT_AMBULATORY_CARE_PROVIDER_SITE_OTHER): Payer: Medicare Other | Admitting: Family Medicine

## 2015-02-04 ENCOUNTER — Encounter: Payer: Self-pay | Admitting: Family Medicine

## 2015-02-04 VITALS — BP 130/80 | HR 62 | Ht 61.0 in | Wt 222.0 lb

## 2015-02-04 DIAGNOSIS — Z09 Encounter for follow-up examination after completed treatment for conditions other than malignant neoplasm: Secondary | ICD-10-CM | POA: Diagnosis not present

## 2015-02-04 DIAGNOSIS — K5733 Diverticulitis of large intestine without perforation or abscess with bleeding: Secondary | ICD-10-CM

## 2015-02-04 DIAGNOSIS — R110 Nausea: Secondary | ICD-10-CM

## 2015-02-04 MED ORDER — PROMETHAZINE HCL 25 MG PO TABS: 25.0000 mg | ORAL_TABLET | Freq: Three times a day (TID) | ORAL | Status: DC | PRN

## 2015-02-04 MED ORDER — AMOXICILLIN-POT CLAVULANATE 875-125 MG PO TABS: 1.0000 | ORAL_TABLET | Freq: Two times a day (BID) | ORAL | Status: DC

## 2015-02-04 NOTE — Assessment & Plan Note (Signed)
Work on weight loss.  Follow up  Dr. Elsworth Soho in 4 months and As needed

## 2015-02-04 NOTE — Progress Notes (Signed)
Name: Holly Smith   MRN: [id number]    DOB: June 20, 1945   Date:02/04/2015       Progress Note  Subjective  Chief Complaint  Chief Complaint  Patient presents with  . Follow-up    Abdominal Pain This is a new (recent hosp for diverticulitis) problem. The current episode started 1 to 4 weeks ago. The onset quality is gradual. The problem occurs daily. The problem has been gradually improving. The pain is located in the RLQ. The pain is at a severity of 3/10. The pain is mild. The quality of the pain is colicky. The abdominal pain does not radiate. Associated symptoms include nausea. Pertinent negatives include no anorexia, constipation, diarrhea, dysuria, fever, frequency, headaches, hematochezia, hematuria, melena, myalgias or weight loss. Associated symptoms comments: prob metronidazole/ somewhat tolerated. Relieved by: antibiotics cipro + metonidazole. She has tried antibiotics for the symptoms. The treatment provided moderate relief. Prior diagnostic workup includes CT scan.    No problem-specific assessment & plan notes found for this encounter.   Past Medical History  Diagnosis Date  . HPV (human papilloma virus) anogenital infection     03/21/12 PAP + HR HPV  . Abnormal Pap smear of vagina and vaginal HPV   . Hypertension   . Obesity   . Chronic anticoagulation     She is on Xarelto for Afib  . Chronic atrial fibrillation ([place])   . Echocardiogram abnormal     Mitral regurgitation, tricuspic regurgitation  . Urolithiasis   . Postoperative nausea and vomiting   . CHF (congestive heart failure) ([place])   . Insulin resistance   . History of stress test     Myoview 8/16: EF 42%, anteroseptal, inferoseptal, anterior, apical anterior and apical defect (likely represents breast attenuation versus scar), no ischemia; intermediate risk because of low EF  . Kidney stones   . Vulvar cancer ([place]) 2016    invasive verrucous carcinoma of the vulva. History of genital warts and vulvar  condyloma  . COPD (chronic obstructive pulmonary disease) ([place])     "pulmonary drs said I don't have COPD fall 2016" (01/23/2015)  . OSA on CPAP   . GERD (gastroesophageal reflux disease)   . Arthritis     "bones ache" (01/23/2015)    Past Surgical History  Procedure Laterality Date  . Vulvectomy Right 05/10/2014    Excisional biopsy of the superior right labial majora mass; [facility]  . Bilateral salpingoophorectomy  2003    benign ovarian cancer  . Lithotripsy    . Vulvectomy partial  07/02/2014    Re-excision and sentinel node dissection at [facility].   . Tonsillectomy    . Appendectomy    . Abdominal hysterectomy    . Cardioversion  2014-2015    "[facility]"    Family History  Problem Relation Age of Onset  . Prostate cancer Brother 103    still living and well  . Heart attack Father   . Stroke Mother   . Diabetes Mother   . Hypertension Mother   . Heart attack Brother   . Pancreatic cancer Brother   . Healthy Sister     Social History   Social History  . Marital Status: Divorced    Spouse Name: N/A  . Number of Children: N/A  . Years of Education: N/A   Occupational History  . Not on file.   Social History Main Topics  . Smoking status: Former Smoker -- 1.00 packs/day for 50 years    Types: Cigarettes,  E-cigarettes    Quit date: 05/20/2012  . Smokeless tobacco: Never Used  . Alcohol Use: 0.0 oz/week    0 Standard drinks or equivalent per week     Comment: 01/23/2015 "I' used to drink socially a few times/month"  . Drug Use: No  . Sexual Activity: Not Currently   Other Topics Concern  . Not on file   Social History Narrative    Allergies  Allergen Reactions  . Ciprofloxacin     SOB     Review of Systems  Constitutional: Negative for fever, chills, weight loss and malaise/fatigue.  HENT: Negative for ear discharge, ear pain and sore throat.   Eyes: Negative for blurred vision.  Respiratory: Negative for cough, sputum production,  shortness of breath and wheezing.   Cardiovascular: Negative for chest pain, palpitations and leg swelling.  Gastrointestinal: Positive for nausea and abdominal pain. Negative for heartburn, diarrhea, constipation, blood in stool, melena, hematochezia and anorexia.  Genitourinary: Negative for dysuria, urgency, frequency and hematuria.  Musculoskeletal: Negative for myalgias, back pain, joint pain and neck pain.  Skin: Negative for rash.  Neurological: Negative for dizziness, tingling, sensory change, focal weakness and headaches.  Endo/Heme/Allergies: Negative for environmental allergies and polydipsia. Does not bruise/bleed easily.  Psychiatric/Behavioral: Negative for depression and suicidal ideas. The patient is not nervous/anxious and does not have insomnia.      Objective  Filed Vitals:   02/04/15 1144  BP: 130/80  Pulse: 62  Height: 5\' 1"  (1.549 m)  Weight: 222 lb (100.699 kg)    Physical Exam  Constitutional: She is well-developed, well-nourished, and in no distress. No distress.  HENT:  Head: Normocephalic and atraumatic.  Right Ear: External ear normal.  Left Ear: External ear normal.  Nose: Nose normal.  Mouth/Throat: Oropharynx is clear and moist.  Eyes: Conjunctivae and EOM are normal. Pupils are equal, round, and reactive to light. Right eye exhibits no discharge. Left eye exhibits no discharge.  Neck: Normal range of motion. Neck supple. No JVD present. No thyromegaly present.  Cardiovascular: Normal rate, regular rhythm, normal heart sounds and intact distal pulses.  Exam reveals no gallop and no friction rub.   No murmur heard. Pulmonary/Chest: Effort normal and breath sounds normal.  Abdominal: Soft. Bowel sounds are normal. She exhibits no distension and no mass. There is tenderness. There is no guarding.  Musculoskeletal: Normal range of motion. She exhibits no edema.  Lymphadenopathy:    She has no cervical adenopathy.  Neurological: She is alert. She has  normal reflexes.  Skin: Skin is warm and dry. She is not diaphoretic.  Psychiatric: Mood and affect normal.  Nursing note and vitals reviewed.     Assessment & Plan  Problem List Items Addressed This Visit      Digestive   Diverticulitis of large intestine without perforation or abscess with bleeding   Relevant Medications   amoxicillin-clavulanate (AUGMENTIN) 875-125 MG tablet   promethazine (PHENERGAN) 25 MG tablet    Other Visit Diagnoses    Hospital discharge follow-up    -  Primary    Nausea        prob metronidazole    Relevant Medications    promethazine (PHENERGAN) 25 MG tablet         Dr. [facility]  02/04/2015

## 2015-02-07 ENCOUNTER — Ambulatory Visit (INDEPENDENT_AMBULATORY_CARE_PROVIDER_SITE_OTHER): Payer: Medicare Other | Admitting: Cardiology

## 2015-02-07 ENCOUNTER — Encounter: Payer: Self-pay | Admitting: Cardiology

## 2015-02-07 VITALS — BP 116/84 | HR 95 | Ht 61.0 in | Wt 222.8 lb

## 2015-02-07 DIAGNOSIS — I5032 Chronic diastolic (congestive) heart failure: Secondary | ICD-10-CM

## 2015-02-07 DIAGNOSIS — R931 Abnormal findings on diagnostic imaging of heart and coronary circulation: Secondary | ICD-10-CM | POA: Diagnosis not present

## 2015-02-07 DIAGNOSIS — G4733 Obstructive sleep apnea (adult) (pediatric): Secondary | ICD-10-CM

## 2015-02-07 DIAGNOSIS — I482 Chronic atrial fibrillation, unspecified: Secondary | ICD-10-CM

## 2015-02-07 DIAGNOSIS — Z9229 Personal history of other drug therapy: Secondary | ICD-10-CM

## 2015-02-07 DIAGNOSIS — I5031 Acute diastolic (congestive) heart failure: Secondary | ICD-10-CM

## 2015-02-07 DIAGNOSIS — I481 Persistent atrial fibrillation: Secondary | ICD-10-CM

## 2015-02-07 DIAGNOSIS — I4819 Other persistent atrial fibrillation: Secondary | ICD-10-CM

## 2015-02-07 DIAGNOSIS — N289 Disorder of kidney and ureter, unspecified: Secondary | ICD-10-CM

## 2015-02-07 LAB — BASIC METABOLIC PANEL
BUN: 15 mg/dL (ref 7–25)
CHLORIDE: 100 mmol/L (ref 98–110)
CO2: 27 mmol/L (ref 20–31)
Calcium: 9.6 mg/dL (ref 8.6–10.4)
Creat: 1.07 mg/dL — ABNORMAL HIGH (ref 0.60–0.93)
GLUCOSE: 103 mg/dL — AB (ref 65–99)
POTASSIUM: 4.4 mmol/L (ref 3.5–5.3)
Sodium: 137 mmol/L (ref 135–146)

## 2015-02-07 NOTE — Progress Notes (Signed)
Cardiology Office Note   Date:  02/07/2015   ID:  Brenlee, Fix [birth year], MRN [id number]  PCP:  Otilio Miu, MD  Cardiologist:  Dr. Irish Lack   EP Dr. Rayann Heman  Chief Complaint  Patient presents with  . chronic A-Fib    recent CHF      History of Present Illness: Holly Smith is a 69 y.o. female who presents for post hospital follow up for CHF and prior to this visit hospitalization for diverticulitis.    Was hospitalized 01/21/15 until 01/24/15 for acute on chronic diastolic HF.  She has chronic a fib. with Chads2vasc score is at least 4 (sex, age, HTN, CHF). She was continued on Xarelto. Hemoglobin remained stable. Metoprolol was increased from 75-100 mg twice daily to help rate control as well as blood pressure. Irbesartan 37.5 mg was continued. Blood pressure improved considerably. She had excellent response to diuresis with 7 L out. Nuc study in 10/2014 was neg. For ischemia.  Last echo EF 55-60% mild MR.   She was readmitted for diverilutis 01/28/15 and d/c'd 01/31/15.  She was placed on ABX and is improving.  She continues on low residue diet. She continues on Xarelto and no bleeding.  Plans are for colonoscopy in 6 weeks.    Today she denies chest pain, she does have DOE but this is chronic.  Her wt is stable. She monitors the salt in her diet.     Past Medical History  Diagnosis Date  . HPV (human papilloma virus) anogenital infection     03/21/12 PAP + HR HPV  . Abnormal Pap smear of vagina and vaginal HPV   . Hypertension   . Obesity   . Chronic anticoagulation     She is on Xarelto for Afib  . Chronic atrial fibrillation ([place])   . Echocardiogram abnormal     Mitral regurgitation, tricuspic regurgitation  . Urolithiasis   . Postoperative nausea and vomiting   . CHF (congestive heart failure) ([place])   . Insulin resistance   . History of stress test     Myoview 8/16: EF 42%, anteroseptal, inferoseptal, anterior, apical anterior and apical defect  (likely represents breast attenuation versus scar), no ischemia; intermediate risk because of low EF  . Kidney stones   . Vulvar cancer ([place]) 2016    invasive verrucous carcinoma of the vulva. History of genital warts and vulvar condyloma  . COPD (chronic obstructive pulmonary disease) ([place])     "pulmonary drs said I don't have COPD fall 2016" (01/23/2015)  . OSA on CPAP   . GERD (gastroesophageal reflux disease)   . Arthritis     "bones ache" (01/23/2015)    Past Surgical History  Procedure Laterality Date  . Vulvectomy Right 05/10/2014    Excisional biopsy of the superior right labial majora mass; [facility]  . Bilateral salpingoophorectomy  2003    benign ovarian cancer  . Lithotripsy    . Vulvectomy partial  07/02/2014    Re-excision and sentinel node dissection at [facility].   . Tonsillectomy    . Appendectomy    . Abdominal hysterectomy    . Cardioversion  2014-2015    "[place]"     Current Outpatient Prescriptions  Medication Sig Dispense Refill  . albuterol (PROVENTIL HFA;VENTOLIN HFA) 108 (90 BASE) MCG/ACT inhaler Inhale 2 puffs into the lungs every 8 (eight) hours as needed for wheezing or shortness of breath. 1 Inhaler 6  . amoxicillin-clavulanate (AUGMENTIN) 875-125 MG tablet Take 1  tablet by mouth 2 (two) times daily. 20 tablet 0  . furosemide (LASIX) 40 MG tablet Take 40 mg by mouth daily.    . irbesartan (AVAPRO) 75 MG tablet Take 37.5 mg by mouth daily.     . metoprolol (LOPRESSOR) 100 MG tablet Take 1 tablet (100 mg total) by mouth 2 (two) times daily. 60 tablet 11  . metroNIDAZOLE (FLAGYL) 500 MG tablet Take 1 tablet (500 mg total) by mouth 3 (three) times daily. 30 tablet 0  . omeprazole (PRILOSEC) 20 MG capsule Take 20 mg by mouth daily before breakfast.    . promethazine (PHENERGAN) 25 MG tablet Take 1 tablet (25 mg total) by mouth every 8 (eight) hours as needed for nausea or vomiting. 20 tablet 1  . rivaroxaban (XARELTO) 20 MG TABS tablet Take  20 mg by mouth daily with supper.     . saccharomyces boulardii (FLORASTOR) 250 MG capsule Take 1 capsule (250 mg total) by mouth 2 (two) times daily. 30 capsule 0   No current facility-administered medications for this visit.    Allergies:   Ciprofloxacin    Social History:  The patient  reports that she quit smoking about 2 years ago. Her smoking use included Cigarettes and E-cigarettes. She has a 50 pack-year smoking history. She has never used smokeless tobacco. She reports that she drinks alcohol. She reports that she does not use illicit drugs.   Family History:  The patient's family history includes Diabetes in her mother; Healthy in her sister; Heart attack in her brother and father; Hypertension in her mother; Pancreatic cancer in her brother; Prostate cancer (age of onset: 47) in her brother; Stroke in her mother.    ROS:  General:no colds or fevers, no weight changes Skin:no rashes or ulcers HEENT:no blurred vision, no congestion CV:see HPI PUL:see HPI GI:no diarrhea constipation or melena, no indigestion, abd pain improving. GU:no hematuria, no dysuria MS:no joint pain, no claudication Neuro:no syncope, no lightheadedness Endo:no diabetes, no thyroid disease  Wt Readings from Last 3 Encounters:  02/07/15 222 lb 12.8 oz (101.061 kg)  02/04/15 222 lb (100.699 kg)  01/31/15 222 lb (100.699 kg)     PHYSICAL EXAM: VS:  BP 116/84 mmHg  Pulse 95  Ht 5\' 1"  (1.549 m)  Wt 222 lb 12.8 oz (101.061 kg)  BMI 42.12 kg/m2 , BMI Body mass index is 42.12 kg/(m^2). General:Pleasant affect, NAD Skin:Warm and dry, brisk capillary refill HEENT:normocephalic, sclera clear, mucus membranes moist Neck:supple, no JVD, no bruits  Heart:irreg irreg without murmur, gallup, rub or click Lungs:clear without rales, rhonchi, or wheezes [id number], mild tenderness, + BS, do not palpate liver spleen or masses Ext:no lower ext edema, 2+ pedal pulses, 2+ radial pulses Neuro:alert and oriented X 3,  MAE, follows commands, + facial symmetry    EKG:  EKG is ordered today. The ekg ordered today demonstrates a fib rate control no acute changes.   Recent Labs: 09/16/2014: Pro B Natriuretic peptide (BNP) 119.0* 01/21/2015: B Natriuretic Peptide 651.5* 01/28/2015: Magnesium 2.1 01/29/2015: TSH 1.023 01/31/2015: ALT 12*; BUN 12; Creatinine, Ser 1.16*; Hemoglobin 11.8*; Platelets 227; Potassium 4.3; Sodium 141    Lipid Panel    Component Value Date/Time   CHOL 118 01/22/2015 0455   TRIG 103 01/22/2015 0455   HDL 27* 01/22/2015 0455   CHOLHDL 4.4 01/22/2015 0455   VLDL 21 01/22/2015 0455   LDLCALC 70 01/22/2015 0455       Other studies Reviewed: Additional studies/ records that were reviewed today  include: a fib rate controlled no acute ST changes from previous. Hospital notes, previous OV notes.   ASSESSMENT AND PLAN:  1.  Chronic diastolic HF stable, with recent admit for acute diastolic HF.  Continue lasix at 40 mg, she did not do well at lower dose.follow up with Dr. Irish Lack in 8 weeks.  2.  Chronic a fib stable with rate control  3. Anticoagulation, on xarelto samples given.  If needs to hold xarelto for colonoscopy stop 2 days before and resume day of or day after per gi.  4. Renal insuff. Will check BMP to see if stable.  5. OSA with CPAP which she uses.  6. HTN controlled.        Current medicines are reviewed with the patient today.  The patient Has no concerns regarding medicines.  The following changes have been made:  See above Labs/ tests ordered today include:see above  Disposition:   FU:  see above  Signed, Isaiah Serge, NP  02/07/2015 10:16 PM    [facility] [place], [place], [place], [place] Phone: [phone]; Fax: [phone]

## 2015-02-07 NOTE — Patient Instructions (Signed)
Medication Instructions:  Your physician recommends that you continue on your current medications as directed. Please refer to the Current Medication list given to you today.   Labwork: TODAY:  BMP  Testing/Procedures: None ordered  Follow-Up: Your physician recommends that you schedule a follow-up appointment in:  [place] DR. VARANASI   Any Other Special Instructions Will Be Listed Below (If Applicable).   If you need a refill on your cardiac medications before your next appointment, please call your pharmacy.

## 2015-02-24 DIAGNOSIS — K625 Hemorrhage of anus and rectum: Secondary | ICD-10-CM | POA: Diagnosis not present

## 2015-02-24 DIAGNOSIS — K5732 Diverticulitis of large intestine without perforation or abscess without bleeding: Secondary | ICD-10-CM | POA: Diagnosis not present

## 2015-03-10 ENCOUNTER — Ambulatory Visit: Payer: Medicare Other | Admitting: Internal Medicine

## 2015-03-10 ENCOUNTER — Ambulatory Visit (INDEPENDENT_AMBULATORY_CARE_PROVIDER_SITE_OTHER): Payer: Medicare Other | Admitting: Internal Medicine

## 2015-03-10 ENCOUNTER — Encounter: Payer: Self-pay | Admitting: Internal Medicine

## 2015-03-10 VITALS — BP 130/84 | HR 102 | Ht 61.0 in | Wt 223.8 lb

## 2015-03-10 DIAGNOSIS — I482 Chronic atrial fibrillation, unspecified: Secondary | ICD-10-CM

## 2015-03-10 DIAGNOSIS — G4733 Obstructive sleep apnea (adult) (pediatric): Secondary | ICD-10-CM

## 2015-03-10 DIAGNOSIS — I5033 Acute on chronic diastolic (congestive) heart failure: Secondary | ICD-10-CM | POA: Diagnosis not present

## 2015-03-10 MED ORDER — METOPROLOL TARTRATE 100 MG PO TABS: 150.0000 mg | ORAL_TABLET | Freq: Two times a day (BID) | ORAL | Status: DC

## 2015-03-10 NOTE — Patient Instructions (Signed)
Medication Instructions:  Your physician has recommended you make the following change in your medication:  1) Increase Furosemide to 80mg  for 5 days then go back to 40 mg daily 2) Increase Metoprolol to 150 mg twice daily   Labwork: None ordered   Testing/Procedures: None ordered   Follow-Up: Your physician recommends that you schedule a follow-up appointment as scheduled with Dr Irish Lack   Any Other Special Instructions Will Be Listed Below (If Applicable).     If you need a refill on your cardiac medications before your next appointment, please call your pharmacy.

## 2015-03-10 NOTE — Progress Notes (Signed)
Electrophysiology Office Note   Date:  03/10/2015   ID:  Holly Smith 29-Feb-1946, MRN [id number]  PCP:  Otilio Miu, MD  Cardiologist:  Previously Dr Gigi Gin, currently Dr Irish Lack Primary Electrophysiologist: Thompson Grayer, MD    Chief Complaint  Patient presents with  . Chronic AFIB     History of Present Illness: Holly Smith is a 69 y.o. female who presents today for electrophysiology evaluation.  She has a h/o tachycardia mediated cardiomyopathy (now improved), longstanding persistent AFib, HTN, HL, OSA and obesity.  She initially presented for routine preoperative GYN evaluation and was found to have afib.  She was minimally symptomatic at that time.  She subsequently developed tachycardia mediated cardiomyopathy.  She underwent cardioversion 06/2013 at [facility] by Dr Josefa Half previously.  Follow-up notes indicate she was back in atrial fibrillation by June 2015.  She has failed medical therapy with Propafenone and Amiodarone. She established with Dr. Casandra Doffing 09/16/14 and noted symptoms of DOE. He placed her on low dose Lasix 3 x a week. Her symptoms resolved with diuresis.  She has modest obesity.  She has recently started dieting and has lost 6 lbs.  She sleeps with CPAP nightly. She has dependent pedal edema without significant change.  Since I saw her last, she was hospitalized for CHF and also for diverticulitis.  She seems to be doing well presently.  She has stable SOB.    Past Medical History  Diagnosis Date  . HPV (human papilloma virus) anogenital infection     03/21/12 PAP + HR HPV  . Abnormal Pap smear of vagina and vaginal HPV   . Hypertension   . Obesity   . Chronic anticoagulation     She is on Xarelto for Afib  . Chronic atrial fibrillation ([place])   . Echocardiogram abnormal     Mitral regurgitation, tricuspic regurgitation  . Urolithiasis   . Postoperative nausea and vomiting   . CHF (congestive heart failure) ([place])   . Insulin  resistance   . History of stress test     Myoview 8/16: EF 42%, anteroseptal, inferoseptal, anterior, apical anterior and apical defect (likely represents breast attenuation versus scar), no ischemia; intermediate risk because of low EF  . Kidney stones   . Vulvar cancer ([place]) 2016    invasive verrucous carcinoma of the vulva. History of genital warts and vulvar condyloma  . COPD (chronic obstructive pulmonary disease) ([place])     "pulmonary drs said I don't have COPD fall 2016" (01/23/2015)  . OSA on CPAP   . GERD (gastroesophageal reflux disease)   . Arthritis     "bones ache" (01/23/2015)   Past Surgical History  Procedure Laterality Date  . Vulvectomy Right 05/10/2014    Excisional biopsy of the superior right labial majora mass; [facility]  . Bilateral salpingoophorectomy  2003    benign ovarian cancer  . Lithotripsy    . Vulvectomy partial  07/02/2014    Re-excision and sentinel node dissection at [facility].   . Tonsillectomy    . Appendectomy    . Abdominal hysterectomy    . Cardioversion  2014-2015    "[place]"     Current Outpatient Prescriptions  Medication Sig Dispense Refill  . albuterol (PROVENTIL HFA;VENTOLIN HFA) 108 (90 BASE) MCG/ACT inhaler Inhale 2 puffs into the lungs every 8 (eight) hours as needed for wheezing or shortness of breath. 1 Inhaler 6  . furosemide (LASIX) 40 MG tablet Take 40 mg by mouth daily.    Marland Kitchen  irbesartan (AVAPRO) 75 MG tablet Take 37.5 mg by mouth daily.     . metoprolol (LOPRESSOR) 100 MG tablet Take 1 tablet (100 mg total) by mouth 2 (two) times daily. 60 tablet 11  . metroNIDAZOLE (FLAGYL) 500 MG tablet Take 1 tablet (500 mg total) by mouth 3 (three) times daily. 30 tablet 0  . omeprazole (PRILOSEC) 20 MG capsule Take 20 mg by mouth daily before breakfast.    . rivaroxaban (XARELTO) 20 MG TABS tablet Take 20 mg by mouth daily with supper.     . saccharomyces boulardii (FLORASTOR) 250 MG capsule Take 1 capsule (250 mg total) by  mouth 2 (two) times daily. 30 capsule 0   No current facility-administered medications for this visit.    Allergies:   Ciprofloxacin   Social History:  The patient  reports that she quit smoking about 2 years ago. Her smoking use included Cigarettes and E-cigarettes. She has a 50 pack-year smoking history. She has never used smokeless tobacco. She reports that she drinks alcohol. She reports that she does not use illicit drugs.   Family History:  The patient's  family history includes Diabetes in her mother; Healthy in her sister; Heart attack in her brother and father; Hypertension in her mother; Pancreatic cancer in her brother; Prostate cancer (age of onset: 44) in her brother; Stroke in her mother.    ROS:  Please see the history of present illness.   All other systems are reviewed and negative.    PHYSICAL EXAM: VS:  BP 130/84 mmHg  Pulse 102  Ht 5\' 1"  (1.549 m)  Wt 223 lb 12.8 oz (101.515 kg)  BMI 42.31 kg/m2  SpO2 89% , BMI Body mass index is 42.31 kg/(m^2). GEN: morbidly obese, in no acute distress HEENT: normal Neck: no JVD, carotid bruits, or masses Cardiac: iRRR; no murmurs, rubs, or gallops,no edema  Respiratory:  clear to auscultation bilaterally, normal work of breathing GI: soft, nontender, nondistended, + BS MS: no deformity or atrophy Skin: warm and dry  Neuro:  Strength and sensation are intact Psych: euthymic mood, full affect  EKG:  EKG is ordered today. The ekg ordered today shows afib, V rate 102 bpm, rsr', PVCs  Recent Labs: 09/16/2014: Pro B Natriuretic peptide (BNP) 119.0* 01/21/2015: B Natriuretic Peptide 651.5* 01/28/2015: Magnesium 2.1 01/29/2015: TSH 1.023 01/31/2015: ALT 12*; Hemoglobin 11.8*; Platelets 227 02/07/2015: BUN 15; Creat 1.07*; Potassium 4.4; Sodium 137    Lipid Panel     Component Value Date/Time   CHOL 118 01/22/2015 0455   TRIG 103 01/22/2015 0455   HDL 27* 01/22/2015 0455   CHOLHDL 4.4 01/22/2015 0455   VLDL 21 01/22/2015  0455   LDLCALC 70 01/22/2015 0455     Wt Readings from Last 3 Encounters:  03/10/15 223 lb 12.8 oz (101.515 kg)  02/07/15 222 lb 12.8 oz (101.061 kg)  02/04/15 222 lb (100.699 kg)      Other studies Reviewed: Additional studies/ records that were reviewed today include: epic records including Dr Hassell Done notes, prior echo, and Leta Speller notes are reviewed   ASSESSMENT AND PLAN:  1.  Longstanding persistent atrial fibrillation She has been in afib for at least a year.  She has failed medical therapy with propafenone and amiodarone.  She has been minimally symptomatic with afib, but has had some difficulty with CHF in the setting of poor rate control.  Therapeutic strategies for afib including rate control and rhythm control were discussed in detail with the patient today.  We discussed lifestyle modification including regular exercise and weight reduction strategies.   Though I think that her success rates with ablation are low, I have offered tikosyn as an alternative.  She will contemplate this option and contact my office if she decides to proceed. Increase metoprolol to 150mg  BID chads2vasc score is at least 4.  Continue xarelto.  2. CHF EF has improved Continue medical therapy Increase lasix to 80mg  daily x 5 days then resume 40mg  daily Daily weights 2gm sodium restriction  3. Obesity Body mass index is 42.31 kg/(m^2). Weight reduction strongly encouraged  4. OSA Compliance with CPAP is encouraged    Follow-up:  Return to see Dr Irish Lack as scheduled in February.  I will see when needed going forward.  Current medicines are reviewed at length with the patient today.   The patient does not have concerns regarding her medicines.  The following changes were made today:  none  Signed, Thompson Grayer, MD  03/10/2015 9:12 AM     [facility] [address] [place] [address] [phone] (office) [phone] (fax)

## 2015-03-20 ENCOUNTER — Ambulatory Visit: Payer: Medicare Other | Admitting: Internal Medicine

## 2015-03-26 ENCOUNTER — Ambulatory Visit: Payer: Self-pay

## 2015-03-27 DIAGNOSIS — I482 Chronic atrial fibrillation: Secondary | ICD-10-CM | POA: Diagnosis not present

## 2015-03-27 DIAGNOSIS — R194 Change in bowel habit: Secondary | ICD-10-CM | POA: Diagnosis not present

## 2015-03-27 DIAGNOSIS — K5792 Diverticulitis of intestine, part unspecified, without perforation or abscess without bleeding: Secondary | ICD-10-CM | POA: Diagnosis not present

## 2015-03-27 DIAGNOSIS — Z79899 Other long term (current) drug therapy: Secondary | ICD-10-CM | POA: Diagnosis not present

## 2015-04-08 ENCOUNTER — Telehealth: Payer: Self-pay | Admitting: Interventional Cardiology

## 2015-04-08 NOTE — Telephone Encounter (Signed)
New message      Request for surgical clearance:  What type of surgery is being performed? colonoscopy 1. When is this surgery scheduled? 05-02-15  Are there any medications that need to be held prior to surgery and how long? Hold xarelto prior? Name of physician performing surgery?  Dr Shary Key 2. What is your office phone and fax number?  Fax [phone]

## 2015-04-15 NOTE — Telephone Encounter (Signed)
How long to hold Xarelto?

## 2015-04-16 ENCOUNTER — Inpatient Hospital Stay: Payer: Medicare Other | Attending: Obstetrics and Gynecology | Admitting: Obstetrics and Gynecology

## 2015-04-16 ENCOUNTER — Telehealth: Payer: Self-pay | Admitting: Interventional Cardiology

## 2015-04-16 ENCOUNTER — Encounter: Payer: Self-pay | Admitting: Obstetrics and Gynecology

## 2015-04-16 VITALS — BP 123/81 | HR 72 | Temp 98.0°F | Ht 61.0 in | Wt 221.0 lb

## 2015-04-16 DIAGNOSIS — I482 Chronic atrial fibrillation: Secondary | ICD-10-CM | POA: Insufficient documentation

## 2015-04-16 DIAGNOSIS — I5042 Chronic combined systolic (congestive) and diastolic (congestive) heart failure: Secondary | ICD-10-CM | POA: Insufficient documentation

## 2015-04-16 DIAGNOSIS — Z87891 Personal history of nicotine dependence: Secondary | ICD-10-CM | POA: Insufficient documentation

## 2015-04-16 DIAGNOSIS — I10 Essential (primary) hypertension: Secondary | ICD-10-CM | POA: Diagnosis not present

## 2015-04-16 DIAGNOSIS — R32 Unspecified urinary incontinence: Secondary | ICD-10-CM | POA: Insufficient documentation

## 2015-04-16 DIAGNOSIS — Z8544 Personal history of malignant neoplasm of other female genital organs: Secondary | ICD-10-CM | POA: Diagnosis not present

## 2015-04-16 DIAGNOSIS — G4733 Obstructive sleep apnea (adult) (pediatric): Secondary | ICD-10-CM | POA: Diagnosis not present

## 2015-04-16 DIAGNOSIS — G4730 Sleep apnea, unspecified: Secondary | ICD-10-CM | POA: Insufficient documentation

## 2015-04-16 DIAGNOSIS — Z7901 Long term (current) use of anticoagulants: Secondary | ICD-10-CM | POA: Diagnosis not present

## 2015-04-16 DIAGNOSIS — Z90722 Acquired absence of ovaries, bilateral: Secondary | ICD-10-CM | POA: Insufficient documentation

## 2015-04-16 DIAGNOSIS — K5732 Diverticulitis of large intestine without perforation or abscess without bleeding: Secondary | ICD-10-CM | POA: Insufficient documentation

## 2015-04-16 DIAGNOSIS — Z90710 Acquired absence of both cervix and uterus: Secondary | ICD-10-CM | POA: Insufficient documentation

## 2015-04-16 DIAGNOSIS — C519 Malignant neoplasm of vulva, unspecified: Secondary | ICD-10-CM

## 2015-04-16 DIAGNOSIS — E785 Hyperlipidemia, unspecified: Secondary | ICD-10-CM | POA: Insufficient documentation

## 2015-04-16 DIAGNOSIS — J449 Chronic obstructive pulmonary disease, unspecified: Secondary | ICD-10-CM | POA: Insufficient documentation

## 2015-04-16 DIAGNOSIS — A630 Anogenital (venereal) warts: Secondary | ICD-10-CM | POA: Diagnosis not present

## 2015-04-16 NOTE — Progress Notes (Signed)
Assisted MD with pelvic exam

## 2015-04-16 NOTE — Progress Notes (Signed)
Gynecologic Oncology Interval Note  Referring Provider: Dr. Laverta Baltimore  Chief Concern: Vulvar cancer surveillance.   Subjective:  Nylia Cindylou Pagan is a 70 y.o. woman who presents today for continued surveillance for history of invasive vulvar cancer.   She was admitted to the hospital about a month ago for diverticulitis and treated with antibiotics.  Will be having a follow up colonoscopy soon.  Has about 3 bowel movements daily.  Still has urinary leakage for which she uses [facility].  PAP 3/16 normal.  No gyn complaints.   Oncology Treatment History:  Patient had warty lesion on the upper right labia for about two years.    Hysterectomy in the past for benign disease.  Then BSO in 2003 for benign ovarian tumor.  03/21/12 PAP + HR HPV vaginal bx 2 o'clock negative, 9 o'clock HPV effect  Vulvar itching and pain.  07/18/12 - vulvar bx showed condyloma  [id number] - vulvar bx - lichen sclerosis  [id number] - Exophytic mass on right anterior vulva. 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  Negative margins, but within 71mm of cancer.  No LVSI.    We discussed the rationale for re-excision of the scar to insure that there is no residual cancer present in view of the close 2 mm margin and sentinel lymph node mapping and biopsy because the tumor has > 39mm invasion.  Had partial right modified radical vulvectomy on 07/02/14 at [facility] for invasive verrucous carcinoma of the vulva. The final pathology revealed: negative lymph nodes, no evidence of residual disease.      Problem List: Patient Active Problem List   Diagnosis Date Noted  . Diverticulitis 01/31/2015  . Diverticulitis large intestine w/o perforation or abscess w/bleeding 01/28/2015  . Chronic combined systolic and diastolic CHF, NYHA class 3 ([place]) 01/28/2015  . Acute diverticulitis 01/28/2015  . Diverticulitis of large intestine without perforation or abscess with  bleeding   . Acute on chronic diastolic heart failure ([place]) 01/21/2015  . Hypertensive urgency 01/21/2015  . Morbid obesity ([place]) 01/21/2015  . Blood in stool 01/21/2015  . Acute diastolic CHF (congestive heart failure) ([place]) 01/21/2015  . CAFL (chronic airflow limitation) ([place]) 12/16/2014  . Personal history of perinatal problems 12/16/2014  . BP (high blood pressure) 12/16/2014  . HLD (hyperlipidemia) 12/16/2014  . Dysmetabolic syndrome 123456  . Calculus of kidney 12/16/2014  . Beat, premature ventricular 12/16/2014  . Snores 12/16/2014  . Stress incontinence 12/16/2014  . Fast heart beat 12/16/2014  . Calculus (=stone) 12/16/2014  . OSA (obstructive sleep apnea) 11/27/2014  . Dyspnea 10/07/2014  . Essential hypertension 10/07/2014  . Obesity 10/07/2014  . Genital warts 08/02/2014  . Condyloma acuminatum of vulva 08/02/2014  . Condylomata lata of vulva 08/02/2014  . History of anticoagulant therapy 08/02/2014  . Chronic atrial fibrillation ([place]) 08/02/2014  . Current tobacco use 08/02/2014  . 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[place]) 06/19/2014  . Vulvar cancer ([place]) 05/10/2014  . Vulvar cancer, carcinoma ([place]) 05/10/2014  . 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 Chest pain 08/20/2013  . Breath shortness 08/20/2013  . CCF (congestive cardiac failure) ([place]) 08/03/2013  . History of biliary T-tube placement 06/21/2013  . Abnormal echocardiogram 04/23/2013  . MI (mitral incompetence) 04/23/2013  . TI (tricuspid incompetence) 04/23/2013    Past Medical History: Past Medical History  Diagnosis Date  . HPV (human papilloma virus) anogenital infection     03/21/12 PAP + HR HPV  .  Abnormal Pap smear of vagina and vaginal HPV   . Hypertension   . Obesity   . Chronic anticoagulation     She is on Xarelto for Afib  . Chronic atrial fibrillation ([place])   . Echocardiogram abnormal     Mitral regurgitation, tricuspic regurgitation  . Urolithiasis   . Postoperative  nausea and vomiting   . CHF (congestive heart failure) ([place])   . Insulin resistance   . History of stress test     Myoview 8/16: EF 42%, anteroseptal, inferoseptal, anterior, apical anterior and apical defect (likely represents breast attenuation versus scar), no ischemia; intermediate risk because of low EF  . Kidney stones   . Vulvar cancer ([place]) 2016    invasive verrucous carcinoma of the vulva. History of genital warts and vulvar condyloma  . COPD (chronic obstructive pulmonary disease) ([place])     "pulmonary drs said I don't have COPD fall 2016" (01/23/2015)  . OSA on CPAP   . GERD (gastroesophageal reflux disease)   . Arthritis     "bones ache" (01/23/2015)  . Cancer [facility])     vulvar cancer    Past Surgical History: Past Surgical History  Procedure Laterality Date  . Vulvectomy Right 05/10/2014    Excisional biopsy of the superior right labial majora mass; [facility]  . Bilateral salpingoophorectomy  2003    benign ovarian cancer  . Lithotripsy    . Vulvectomy partial  07/02/2014    Re-excision and sentinel node dissection at [facility].   . Tonsillectomy    . Appendectomy    . Abdominal hysterectomy    . Cardioversion  2014-2015    "[facility]"    Family History: Family History  Problem Relation Age of Onset  . Prostate cancer Brother 65    still living and well  . Heart attack Father   . Stroke Mother   . Diabetes Mother   . Hypertension Mother   . Heart attack Brother   . Pancreatic cancer Brother   . Healthy Sister     Social History: Social History   Social History  . Marital Status: Divorced    Spouse Name: N/A  . Number of Children: N/A  . Years of Education: N/A   Occupational History  . Not on file.   Social History Main Topics  . Smoking status: Former Smoker -- 1.00 packs/day for 50 years    Types: Cigarettes, E-cigarettes    Quit date: 05/20/2012  . Smokeless tobacco: Never Used  . Alcohol Use: 0.0 oz/week    0 Standard drinks  or equivalent per week     Comment: 01/23/2015 "I' used to drink socially a few times/month"  . Drug Use: No  . Sexual Activity: Not Currently   Other Topics Concern  . Not on file   Social History Narrative    Allergies: Allergies  Allergen Reactions  . Ciprofloxacin     SOB    Current Medications: Current Outpatient Prescriptions  Medication Sig Dispense Refill  . albuterol (PROVENTIL HFA;VENTOLIN HFA) 108 (90 BASE) MCG/ACT inhaler Inhale 2 puffs into the lungs every 8 (eight) hours as needed for wheezing or shortness of breath. 1 Inhaler 6  . furosemide (LASIX) 40 MG tablet Take 40 mg by mouth daily.    . irbesartan (AVAPRO) 75 MG tablet Take 37.5 mg by mouth daily.     . metoprolol (LOPRESSOR) 100 MG tablet Take 1.5 tablets (150 mg total) by mouth 2 (two) times daily. 90 tablet 11  .  omeprazole (PRILOSEC) 20 MG capsule Take 20 mg by mouth daily before breakfast.    . rivaroxaban (XARELTO) 20 MG TABS tablet Take 20 mg by mouth daily with supper.     . saccharomyces boulardii (FLORASTOR) 250 MG capsule Take 1 capsule (250 mg total) by mouth 2 (two) times daily. 30 capsule 0  . metroNIDAZOLE (FLAGYL) 500 MG tablet Take 1 tablet (500 mg total) by mouth 3 (three) times daily. (Patient not taking: Reported on 04/16/2015) 30 tablet 0   No current facility-administered medications for this visit.      Review of Systems Pertinent items are noted in HPI.  Objective:  Physical Examination:  BP 123/81 mmHg  Pulse 72  Temp(Src) 98 F (36.7 C) (Oral)  Ht 5\' 1"  (1.549 m)  Wt 221 lb 0.2 oz (100.25 kg)  BMI 41.78 kg/m2  ECOG Performance Status: 0 - Asymptomatic  General appearance: alert, cooperative and appears stated age HEENT:PERRLA, neck supple with midline trachea and thyroid without masses Lymph node survey: non-palpable, axillary, inguinal, supraclavicular Cardiovascular: regular rate and rhythm, no murmurs or gallops Respiratory: normal air entry, lungs clear to  auscultation Breast exam: not examined. Abdomen: soft, non-tender, without masses or organomegaly, no hernias and well healed incision Back: inspection of back is normal Extremities: extremities normal, atraumatic, no cyanosis or edema Skin exam - normal coloration and turgor, no rashes, no suspicious skin lesions noted. Neurological exam reveals alert, oriented, normal speech, no focal findings or movement disorder noted.  Pelvic: exam chaperoned by nurse;  Vulva: normal appearing vulva with no masses, tenderness or lesions s/p partial vulvectomy; Vagina: normal vagina; Adnexa: normal adnexa in size, nontender and no masses; Rectal: normal rectal, no masses  Assessment:  Marcene Brawn with a history of invasive vulvar cancer s/p excision with negative SLNs.  No evidence of disease.  Recently diagnosed with diverticulitis and will follow up with GI for colonoscopy.  Plan:   Problem List Items Addressed This Visit      Genitourinary   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[place]) - Primary   Vulvar cancer ([place])      Reassurance given.  Suggested return to clinic in  4 months.  She is comfortable with the plan and had her questions answered. I offered referral to Michelene Heady for management of incontinence and she may consider this next year.   Mellody Drown, MD  CC:  Juline Patch, MD [address] [place], [place] [address] [phone]

## 2015-04-16 NOTE — Telephone Encounter (Signed)
Pt has a CHADS score of 2 and no history of TIA or stroke noted.  Okay to hold Xarelto x 24 hours prior to colonoscopy.  Will fax to Dr. Shary Key office.

## 2015-04-16 NOTE — Telephone Encounter (Signed)
Follow up   Office of Dr Shary Key calling again to see if Fax was received for the medication to stop for surgery

## 2015-04-16 NOTE — Telephone Encounter (Signed)
Informed office that clearance to hold Xarelto 24 hours has been faxed. Sarah grateful for call.

## 2015-04-24 ENCOUNTER — Telehealth: Payer: Self-pay | Admitting: Gastroenterology

## 2015-04-24 ENCOUNTER — Other Ambulatory Visit: Payer: Self-pay

## 2015-04-24 ENCOUNTER — Telehealth: Payer: Self-pay | Admitting: *Deleted

## 2015-04-24 DIAGNOSIS — Z8719 Personal history of other diseases of the digestive system: Secondary | ICD-10-CM

## 2015-04-24 DIAGNOSIS — Z1211 Encounter for screening for malignant neoplasm of colon: Secondary | ICD-10-CM

## 2015-04-24 NOTE — Telephone Encounter (Signed)
Patient called for xarelto samples. I mentioned the patient assistance program to her, and she was interested. Paperwork and samples placed at the front desk.

## 2015-04-24 NOTE — Telephone Encounter (Signed)
colonoscopy

## 2015-05-02 ENCOUNTER — Telehealth: Payer: Self-pay

## 2015-05-02 ENCOUNTER — Other Ambulatory Visit: Payer: Self-pay

## 2015-05-02 NOTE — Telephone Encounter (Signed)
Gastroenterology Pre-Procedure Review  Request Date: 06/10/15 Requesting Physician: Dr. Otilio Miu  PATIENT REVIEW QUESTIONS: The patient responded to the following health history questions as indicated:    1. Are you having any GI issues? yes (Diverticulitis) 2. Do you have a personal history of Polyps? yes (adenoma) 3. Do you have a family history of Colon Cancer or Polyps? no 4. Diabetes Mellitus? no 5. Joint replacements in the past 12 months?no 6. Major health problems in the past 3 months?no 7. Any artificial heart valves, MVP, or defibrillator?no    MEDICATIONS & ALLERGIES:    Patient reports the following regarding taking any anticoagulation/antiplatelet therapy:   Plavix, Coumadin, Eliquis, Xarelto, Lovenox, Pradaxa, Brilinta, or Effient? yes (Xarelto) Aspirin? no  Patient confirms/reports the following medications:  Current Outpatient Prescriptions  Medication Sig Dispense Refill  . albuterol (PROVENTIL HFA;VENTOLIN HFA) 108 (90 BASE) MCG/ACT inhaler Inhale 2 puffs into the lungs every 8 (eight) hours as needed for wheezing or shortness of breath. 1 Inhaler 6  . furosemide (LASIX) 40 MG tablet Take 40 mg by mouth daily.    . irbesartan (AVAPRO) 75 MG tablet Take 37.5 mg by mouth daily.     . metoprolol (LOPRESSOR) 100 MG tablet Take 1.5 tablets (150 mg total) by mouth 2 (two) times daily. 90 tablet 11  . metroNIDAZOLE (FLAGYL) 500 MG tablet Take 1 tablet (500 mg total) by mouth 3 (three) times daily. (Patient not taking: Reported on 04/16/2015) 30 tablet 0  . omeprazole (PRILOSEC) 20 MG capsule Take 20 mg by mouth daily before breakfast.    . rivaroxaban (XARELTO) 20 MG TABS tablet Take 20 mg by mouth daily with supper.     . saccharomyces boulardii (FLORASTOR) 250 MG capsule Take 1 capsule (250 mg total) by mouth 2 (two) times daily. 30 capsule 0   No current facility-administered medications for this visit.    Patient confirms/reports the following allergies:   Allergies  Allergen Reactions  . Ciprofloxacin     SOB    No orders of the defined types were placed in this encounter.    AUTHORIZATION INFORMATION Primary Insurance: 1D#: Group #:  Secondary Insurance: 1D#: Group #:  SCHEDULE INFORMATION: Date: 06/10/15 Time: Location: [facility]

## 2015-05-02 NOTE — Telephone Encounter (Signed)
Pt scheduled for colonoscopy at [facility] on 06/10/15. DX: diverticulitis (K57.33) Instructs/rx mailed. Pt has [employer] Medicare. Pt will need precert. Thanks!

## 2015-05-05 ENCOUNTER — Encounter: Payer: Self-pay | Admitting: Interventional Cardiology

## 2015-05-05 ENCOUNTER — Ambulatory Visit (INDEPENDENT_AMBULATORY_CARE_PROVIDER_SITE_OTHER): Payer: Medicare Other | Admitting: Interventional Cardiology

## 2015-05-05 VITALS — BP 115/80 | HR 64 | Ht 61.0 in | Wt 218.0 lb

## 2015-05-05 DIAGNOSIS — I10 Essential (primary) hypertension: Secondary | ICD-10-CM

## 2015-05-05 DIAGNOSIS — I5032 Chronic diastolic (congestive) heart failure: Secondary | ICD-10-CM | POA: Insufficient documentation

## 2015-05-05 DIAGNOSIS — I482 Chronic atrial fibrillation, unspecified: Secondary | ICD-10-CM

## 2015-05-05 HISTORY — DX: Chronic diastolic (congestive) heart failure: I50.32

## 2015-05-05 MED ORDER — FUROSEMIDE 40 MG PO TABS: 80.0000 mg | ORAL_TABLET | Freq: Every day | ORAL | Status: DC

## 2015-05-05 MED ORDER — RIVAROXABAN 20 MG PO TABS: 20.0000 mg | ORAL_TABLET | Freq: Every day | ORAL | Status: DC

## 2015-05-05 MED ORDER — METOPROLOL SUCCINATE ER 100 MG PO TB24: 100.0000 mg | ORAL_TABLET | Freq: Two times a day (BID) | ORAL | Status: DC

## 2015-05-05 MED ORDER — METOPROLOL SUCCINATE ER 50 MG PO TB24: 50.0000 mg | ORAL_TABLET | Freq: Every day | ORAL | Status: DC

## 2015-05-05 NOTE — Progress Notes (Signed)
Patient ID: Holly Smith, female   DOB: 1945-04-04, 70 y.o.   MRN: [id number]     Cardiology Office Note   Date:  05/05/2015   ID:  Holly Smith, Holly Smith 01-02-1946, MRN [id number]  PCP:  Holly Miu, MD    No chief complaint on file. f/u Holly Smith   Wt Readings from Last 3 Encounters:  05/05/15 218 lb (98.884 kg)  04/16/15 221 lb 0.2 oz (100.25 kg)  03/10/15 223 lb 12.8 oz (101.515 kg)       History of Present Illness: Holly Smith is a 70 y.o. female  Who had PAF and a tachycardia mediated cardiomyopathy in 2015.  She was cardioverted at [facility].    She saw me for the first time in 2016.  She feels better taking her diuretics.  She has lost 20+ lbs. She has to stay under her 2000 mg of sodium requirement and she will feel ok.    Dr. Rayann Smith had talked to her about Tikosyn.  She instead took more metoprolol, although not as much as he prescribed.  She is only taking metoprolol 100 mg BID.    She developed diverticulitis and needs a f/u colonoscopy.  Overall, her breathing is better.  SHe continues to lose weight.     Past Medical History  Diagnosis Date  . HPV (human papilloma virus) anogenital infection     03/21/12 PAP + HR HPV  . Abnormal Pap smear of vagina and vaginal HPV   . Hypertension   . Obesity   . Chronic anticoagulation     She is on Xarelto for Afib  . Chronic atrial fibrillation ([facility])   . Echocardiogram abnormal     Mitral regurgitation, tricuspic regurgitation  . Urolithiasis   . Postoperative nausea and vomiting   . CHF (congestive heart failure) ([facility]olly Smith)   . Insulin resistance   . History of stress test     Myoview 8/16: EF 42%, anteroseptal, inferoseptal, anterior, apical anterior and apical defect (likely represents breast attenuation versus scar), no ischemia; intermediate risk because of low EF  . Kidney stones   . Vulvar cancer ([facility]) 2016    invasive verrucous carcinoma of the vulva. History of genital warts and vulvar condyloma  .  COPD (chronic obstructive pulmonary disease) ([facility]olly Smith)     "pulmonary drs said I don't have COPD fall 2016" (01/23/2015)  . OSA on CPAP   . GERD (gastroesophageal reflux disease)   . Arthritis     "bones ache" (01/23/2015)  . Cancer [facility])     vulvar cancer    Past Surgical History  Procedure Laterality Date  . Vulvectomy Right 05/10/2014    Excisional biopsy of the superior right labial majora mass; [facility]  . Bilateral salpingoophorectomy  2003    benign ovarian cancer  . Lithotripsy    . Vulvectomy partial  07/02/2014    Re-excision and sentinel node dissection at [facility].   . Tonsillectomy    . Appendectomy    . Abdominal hysterectomy    . Cardioversion  2014-2015    "[facility]"     Current Outpatient Prescriptions  Medication Sig Dispense Refill  . albuterol (PROVENTIL HFA;VENTOLIN HFA) 108 (90 BASE) MCG/ACT inhaler Inhale 2 puffs into the lungs every 8 (eight) hours as needed for wheezing or shortness of breath. 1 Inhaler 6  . irbesartan (AVAPRO) 75 MG tablet Take 37.5 mg by mouth daily.     Marland Kitchen omeprazole (PRILOSEC) 20 MG capsule Take 20 mg by mouth  daily before breakfast.    . rivaroxaban (XARELTO) 20 MG TABS tablet Take 1 tablet (20 mg total) by mouth daily with supper. 30 tablet 6  . saccharomyces boulardii (FLORASTOR) 250 MG capsule Take 1 capsule (250 mg total) by mouth 2 (two) times daily. 30 capsule 0  . furosemide (LASIX) 40 MG tablet Take 2 tablets (80 mg total) by mouth daily. 60 tablet 6  . metoprolol succinate (TOPROL-XL) 100 MG 24 hr tablet Take 1 tablet (100 mg total) by mouth 2 (two) times daily. Take with or immediately following a meal. 60 tablet 3  . metoprolol succinate (TOPROL-XL) 50 MG 24 hr tablet Take 1 tablet (50 mg total) by mouth daily. Take with or immediately following a meal. 60 tablet 6   No current facility-administered medications for this visit.    Allergies:   Ciprofloxacin    Social History:  The patient  reports that she  quit smoking about 2 years ago. Her smoking use included Cigarettes and E-cigarettes. She has a 50 pack-year smoking history. She has never used smokeless tobacco. She reports that she drinks alcohol. She reports that she does not use illicit drugs.   Family History:  The patient's family history includes Diabetes in her mother; Healthy in her sister; Heart attack in her brother and father; Hypertension in her mother; Pancreatic cancer in her brother; Prostate cancer (age of onset: 54) in her brother; Stroke in her mother.    ROS:  Please see the history of present illness.   Otherwise, review of systems are positive for DOE-improving.   All other systems are reviewed and negative.    PHYSICAL EXAM: VS:  BP 115/80 mmHg  Pulse 64  Ht 5\' 1"  (1.549 m)  Wt 218 lb (98.884 kg)  BMI 41.21 kg/m2 , BMI Body mass index is 41.21 kg/(m^2). GEN: Well nourished, well developed, in no acute distress HEENT: normal Neck: no JVD, carotid bruits, or masses Cardiac: RRR; no murmurs, rubs, or gallops,no edema  Respiratory:  clear to auscultation bilaterally, normal work of breathing GI: soft, nontender, nondistended, + BS, obese MS: no deformity or atrophy Skin: warm and dry, no rash Neuro:  Strength and sensation are intact Psych: euthymic mood, full affect    Recent Labs: 09/16/2014: Pro B Natriuretic peptide (BNP) 119.0* 01/21/2015: B Natriuretic Peptide 651.5* 01/28/2015: Magnesium 2.1 01/29/2015: TSH 1.023 01/31/2015: ALT 12*; Hemoglobin 11.8*; Platelets 227 02/07/2015: BUN 15; Creat 1.07*; Potassium 4.4; Sodium 137   Lipid Panel    Component Value Date/Time   CHOL 118 01/22/2015 0455   TRIG 103 01/22/2015 0455   HDL 27* 01/22/2015 0455   CHOLHDL 4.4 01/22/2015 0455   VLDL 21 01/22/2015 0455   LDLCALC 70 01/22/2015 0455     Other studies Reviewed: Additional studies/ records that were reviewed today with results demonstrating: I reviewed the EP note..   ASSESSMENT AND PLAN:  1. AFib:  rate controlled.  HR 88 apically. Stroke prevention with Xarelto. Increase metoprolol to 150 BID per Dr. Jackalyn Smith instructions. Continues relative for stroke prevention. Her symptoms are well controlled. I don't think she needs to try Tikosyn at this point. 2. Chronic diastolic heart failure: Better controlled with weight loss and higher dose of diuretic. Will call and 80 mg of Lasix daily. 3. Hypertension: Continue current blood pressure medicines.  Blood pressure well controlled. 4. Obesity: I encouraged her to continue to try to lose weight as she is done in the last few months.   Current medicines are reviewed  at length with the patient today.  The patient concerns regarding her medicines were addressed.  The following changes have been made:  As above  Labs/ tests ordered today include:  No orders of the defined types were placed in this encounter.    Recommend 150 minutes/week of aerobic exercise Low fat, low carb, high fiber diet recommended  Disposition:   FU in 6 months   Teresita Madura., MD  05/05/2015 5:03 PM    [facility] [place], [place], [place]  [address] Phone: [phone]; Fax: [phone]

## 2015-05-05 NOTE — Patient Instructions (Addendum)
**Note De-Identified [name] Obfuscation** Medication Instructions:  Increase Furosemide to 80 mg daily and Metoprolol to 150 mg twice daily-All other medications remain the same.  Labwork: None  Testing/Procedures: None  Follow-Up: Your physician wants you to follow-up in: 6 months. You will receive a reminder letter in the mail two months in advance. If you don't receive a letter, please call our office to schedule the follow-up appointment.      If you need a refill on your cardiac medications before your next appointment, please call your pharmacy.

## 2015-05-06 ENCOUNTER — Telehealth: Payer: Self-pay | Admitting: Interventional Cardiology

## 2015-05-06 ENCOUNTER — Telehealth: Payer: Self-pay

## 2015-05-06 MED ORDER — METOPROLOL SUCCINATE ER 50 MG PO TB24: 50.0000 mg | ORAL_TABLET | Freq: Two times a day (BID) | ORAL | Status: DC

## 2015-05-06 NOTE — Telephone Encounter (Signed)
Metoprolol dosing clarified with Estill Bamberg at pharmacy.

## 2015-05-06 NOTE — Telephone Encounter (Signed)
Prior auth for Xarelto 20mg  sent to [facility].

## 2015-05-06 NOTE — Telephone Encounter (Signed)
New message      Calling to get clarification on pts metoprolol prescription.

## 2015-05-07 ENCOUNTER — Telehealth: Payer: Self-pay

## 2015-05-07 NOTE — Telephone Encounter (Signed)
Xarelto approved through 03/21/2016. PA [id number].

## 2015-05-22 ENCOUNTER — Telehealth: Payer: Self-pay

## 2015-05-22 NOTE — Telephone Encounter (Signed)
Patient Assistance forms for Xarelto faxed to [place]

## 2015-05-27 ENCOUNTER — Ambulatory Visit: Payer: Self-pay | Admitting: Pulmonary Disease

## 2015-06-09 ENCOUNTER — Encounter: Payer: Self-pay | Admitting: *Deleted

## 2015-06-10 ENCOUNTER — Ambulatory Visit: Payer: Medicare Other | Admitting: Anesthesiology

## 2015-06-10 ENCOUNTER — Ambulatory Visit
Admission: RE | Admit: 2015-06-10 | Discharge: 2015-06-10 | Disposition: A | Discharge status: Home / Self Care | Payer: Medicare Other | Source: Ambulatory Visit | Attending: Gastroenterology | Admitting: Gastroenterology

## 2015-06-10 ENCOUNTER — Encounter
Admission: RE | Disposition: A | Discharge status: Home / Self Care | Payer: Self-pay | Source: Ambulatory Visit | Attending: Gastroenterology

## 2015-06-10 DIAGNOSIS — M1990 Unspecified osteoarthritis, unspecified site: Secondary | ICD-10-CM | POA: Insufficient documentation

## 2015-06-10 DIAGNOSIS — Z87442 Personal history of urinary calculi: Secondary | ICD-10-CM | POA: Insufficient documentation

## 2015-06-10 DIAGNOSIS — Z6841 Body Mass Index (BMI) 40.0 and over, adult: Secondary | ICD-10-CM | POA: Diagnosis not present

## 2015-06-10 DIAGNOSIS — Z9889 Other specified postprocedural states: Secondary | ICD-10-CM | POA: Diagnosis not present

## 2015-06-10 DIAGNOSIS — Z90710 Acquired absence of both cervix and uterus: Secondary | ICD-10-CM | POA: Insufficient documentation

## 2015-06-10 DIAGNOSIS — I10 Essential (primary) hypertension: Secondary | ICD-10-CM | POA: Insufficient documentation

## 2015-06-10 DIAGNOSIS — D124 Benign neoplasm of descending colon: Secondary | ICD-10-CM | POA: Insufficient documentation

## 2015-06-10 DIAGNOSIS — G4733 Obstructive sleep apnea (adult) (pediatric): Secondary | ICD-10-CM | POA: Diagnosis not present

## 2015-06-10 DIAGNOSIS — K635 Polyp of colon: Secondary | ICD-10-CM | POA: Diagnosis not present

## 2015-06-10 DIAGNOSIS — J449 Chronic obstructive pulmonary disease, unspecified: Secondary | ICD-10-CM | POA: Diagnosis not present

## 2015-06-10 DIAGNOSIS — Z8249 Family history of ischemic heart disease and other diseases of the circulatory system: Secondary | ICD-10-CM | POA: Diagnosis not present

## 2015-06-10 DIAGNOSIS — K5733 Diverticulitis of large intestine without perforation or abscess with bleeding: Secondary | ICD-10-CM | POA: Diagnosis not present

## 2015-06-10 DIAGNOSIS — K5790 Diverticulosis of intestine, part unspecified, without perforation or abscess without bleeding: Secondary | ICD-10-CM | POA: Diagnosis not present

## 2015-06-10 DIAGNOSIS — I509 Heart failure, unspecified: Secondary | ICD-10-CM | POA: Diagnosis not present

## 2015-06-10 DIAGNOSIS — Z7901 Long term (current) use of anticoagulants: Secondary | ICD-10-CM | POA: Insufficient documentation

## 2015-06-10 DIAGNOSIS — K621 Rectal polyp: Secondary | ICD-10-CM | POA: Insufficient documentation

## 2015-06-10 DIAGNOSIS — K5792 Diverticulitis of intestine, part unspecified, without perforation or abscess without bleeding: Secondary | ICD-10-CM | POA: Diagnosis present

## 2015-06-10 DIAGNOSIS — Z8544 Personal history of malignant neoplasm of other female genital organs: Secondary | ICD-10-CM | POA: Diagnosis not present

## 2015-06-10 DIAGNOSIS — Z87891 Personal history of nicotine dependence: Secondary | ICD-10-CM | POA: Insufficient documentation

## 2015-06-10 DIAGNOSIS — I482 Chronic atrial fibrillation: Secondary | ICD-10-CM | POA: Insufficient documentation

## 2015-06-10 DIAGNOSIS — Z823 Family history of stroke: Secondary | ICD-10-CM | POA: Insufficient documentation

## 2015-06-10 DIAGNOSIS — Z90722 Acquired absence of ovaries, bilateral: Secondary | ICD-10-CM | POA: Insufficient documentation

## 2015-06-10 DIAGNOSIS — E669 Obesity, unspecified: Secondary | ICD-10-CM | POA: Insufficient documentation

## 2015-06-10 DIAGNOSIS — K5730 Diverticulosis of large intestine without perforation or abscess without bleeding: Secondary | ICD-10-CM | POA: Insufficient documentation

## 2015-06-10 DIAGNOSIS — K219 Gastro-esophageal reflux disease without esophagitis: Secondary | ICD-10-CM | POA: Insufficient documentation

## 2015-06-10 DIAGNOSIS — D125 Benign neoplasm of sigmoid colon: Secondary | ICD-10-CM | POA: Insufficient documentation

## 2015-06-10 DIAGNOSIS — Z881 Allergy status to other antibiotic agents status: Secondary | ICD-10-CM | POA: Insufficient documentation

## 2015-06-10 DIAGNOSIS — D128 Benign neoplasm of rectum: Secondary | ICD-10-CM | POA: Diagnosis not present

## 2015-06-10 DIAGNOSIS — Z8042 Family history of malignant neoplasm of prostate: Secondary | ICD-10-CM | POA: Insufficient documentation

## 2015-06-10 DIAGNOSIS — Z79899 Other long term (current) drug therapy: Secondary | ICD-10-CM | POA: Insufficient documentation

## 2015-06-10 DIAGNOSIS — Z800 Family history of malignant neoplasm of digestive organs: Secondary | ICD-10-CM | POA: Diagnosis not present

## 2015-06-10 DIAGNOSIS — Z1211 Encounter for screening for malignant neoplasm of colon: Secondary | ICD-10-CM | POA: Diagnosis not present

## 2015-06-10 DIAGNOSIS — Z8543 Personal history of malignant neoplasm of ovary: Secondary | ICD-10-CM | POA: Insufficient documentation

## 2015-06-10 DIAGNOSIS — D122 Benign neoplasm of ascending colon: Secondary | ICD-10-CM | POA: Insufficient documentation

## 2015-06-10 DIAGNOSIS — Z833 Family history of diabetes mellitus: Secondary | ICD-10-CM | POA: Diagnosis not present

## 2015-06-10 HISTORY — PX: COLONOSCOPY WITH PROPOFOL: SHX5780

## 2015-06-10 SURGERY — COLONOSCOPY WITH PROPOFOL
Anesthesia: General

## 2015-06-10 MED ORDER — SODIUM CHLORIDE 0.9 % IV SOLN
INTRAVENOUS | Status: DC
  Administered 2015-06-10: 09:00:00 via INTRAVENOUS
  Administered 2015-06-10: 1000 mL via INTRAVENOUS

## 2015-06-10 MED ORDER — PROPOFOL 500 MG/50ML IV EMUL
INTRAVENOUS | Status: DC | PRN
  Administered 2015-06-10: 50 ug/kg/min via INTRAVENOUS

## 2015-06-10 NOTE — Anesthesia Postprocedure Evaluation (Signed)
Anesthesia Post Note  Patient: Holly Smith  Procedure(s) Performed: Procedure(s) (LRB): COLONOSCOPY WITH PROPOFOL (N/A)  Patient location during evaluation: PACU Anesthesia Type: General Level of consciousness: awake and alert Pain management: pain level controlled Vital Signs Assessment: post-procedure vital signs reviewed and stable Respiratory status: spontaneous breathing, nonlabored ventilation, respiratory function stable and patient connected to nasal cannula oxygen Cardiovascular status: blood pressure returned to baseline and stable Postop Assessment: no signs of nausea or vomiting Anesthetic complications: no    Last Vitals:  Filed Vitals:   06/10/15 1020 06/10/15 1026  BP: 111/41 123/83  Pulse: 78 52  Temp:    Resp: 25 18    Last Pain: There were no vitals filed for this visit.               Molli Barrows

## 2015-06-10 NOTE — H&P (Signed)
[facility]  [address] [place], [place] [address] Phone: [phone] Fax : [phone]  Primary Care Physician:  Otilio Miu, MD Primary Gastroenterologist:  Dr. Allen Norris  Pre-Procedure History & Physical: HPI:  Holly Smith is a 70 y.o. female is here for an colonoscopy.   Past Medical History  Diagnosis Date  . HPV (human papilloma virus) anogenital infection     03/21/12 PAP + HR HPV  . Abnormal Pap smear of vagina and vaginal HPV   . Hypertension   . Obesity   . Chronic anticoagulation     She is on Xarelto for Afib  . Chronic atrial fibrillation ([place])   . Echocardiogram abnormal     Mitral regurgitation, tricuspic regurgitation  . Urolithiasis   . Postoperative nausea and vomiting   . CHF (congestive heart failure) ([place])   . Insulin resistance   . History of stress test     Myoview 8/16: EF 42%, anteroseptal, inferoseptal, anterior, apical anterior and apical defect (likely represents breast attenuation versus scar), no ischemia; intermediate risk because of low EF  . Kidney stones   . Vulvar cancer ([place]) 2016    invasive verrucous carcinoma of the vulva. History of genital warts and vulvar condyloma  . COPD (chronic obstructive pulmonary disease) ([place])     "pulmonary drs said I don't have COPD fall 2016" (01/23/2015)  . OSA on CPAP   . GERD (gastroesophageal reflux disease)   . Arthritis     "bones ache" (01/23/2015)  . Cancer [facility])     vulvar cancer    Past Surgical History  Procedure Laterality Date  . Vulvectomy Right 05/10/2014    Excisional biopsy of the superior right labial majora mass; [facility]  . Bilateral salpingoophorectomy  2003    benign ovarian cancer  . Lithotripsy    . Vulvectomy partial  07/02/2014    Re-excision and sentinel node dissection at [facility].   . Tonsillectomy    . Appendectomy    . Abdominal hysterectomy    . Cardioversion  2014-2015    "[place]"    Prior to Admission  medications   Medication Sig Start Date End Date Taking? Authorizing Provider  furosemide (LASIX) 40 MG tablet Take 2 tablets (80 mg total) by mouth daily. 05/05/15  Yes J[place]ie Booze, MD  irbesartan (AVAPRO) 75 MG tablet Take 37.5 mg by mouth daily.    Yes Historical Provider, MD  metoprolol succinate (TOPROL-XL) 100 MG 24 hr tablet Take 1 tablet (100 mg total) by mouth 2 (two) times daily. Take with or immediately following a meal. 05/05/15  Yes J[place]ie Booze, MD  metoprolol succinate (TOPROL-XL) 50 MG 24 hr tablet Take 1 tablet (50 mg total) by mouth 2 (two) times daily. Take with or immediately following a meal. 05/06/15  Yes J[place]ie Booze, MD  omeprazole (PRILOSEC) 20 MG capsule Take 20 mg by mouth daily before breakfast.   Yes Historical Provider, MD  rivaroxaban (XARELTO) 20 MG TABS tablet Take 1 tablet (20 mg total) by mouth daily with supper. 05/05/15  Yes J[place]ie Booze, MD  saccharomyces boulardii (FLORASTOR) 250 MG capsule Take 1 capsule (250 mg total) by mouth 2 (two) times daily. 01/31/15  Yes Reyne Dumas, MD  albuterol (PROVENTIL HFA;VENTOLIN HFA) 108 (90 BASE) MCG/ACT inhaler Inhale 2 puffs into the lungs every 8 (eight) hours as needed for wheezing or shortness of breath. 11/27/14   Rigoberto Noel, MD    Allergies as of 05/02/2015 -  Review Complete 05/02/2015  Allergen Reaction Noted  . Ciprofloxacin  02/04/2015    Family History  Problem Relation Age of Onset  . Prostate cancer Brother 107    still living and well  . Heart attack Father   . Stroke Mother   . Diabetes Mother   . Hypertension Mother   . Heart attack Brother   . Pancreatic cancer Brother   . Healthy Sister     Social History   Social History  . Marital Status: Divorced    Spouse Name: N/A  . Number of Children: N/A  . Years of Education: N/A   Occupational History  . Not on file.   Social History Main Topics  . Smoking status: Former Smoker -- 1.00 packs/day for 50 years     Types: Cigarettes, E-cigarettes    Quit date: 05/20/2012  . Smokeless tobacco: Never Used  . Alcohol Use: 0.0 oz/week    0 Standard drinks or equivalent per week     Comment: 01/23/2015 "I' used to drink socially a few times/month"  . Drug Use: No  . Sexual Activity: Not Currently   Other Topics Concern  . Not on file   Social History Narrative    Review of Systems: See HPI, otherwise negative ROS  Physical Exam: BP 127/91 mmHg  Pulse 92  Temp(Src) 97.2 F (36.2 C)  Resp 16  Ht 5\' 1"  (1.549 m)  Wt 216 lb (97.977 kg)  BMI 40.83 kg/m2  SpO2 96% General:   Alert,  pleasant and cooperative in NAD Head:  Normocephalic and atraumatic. Neck:  Supple; no masses or thyromegaly. Lungs:  Clear throughout to auscultation.    Heart:  Regular rate and rhythm. Abdomen:  Soft, nontender and nondistended. Normal bowel sounds, without guarding, and without rebound.   Neurologic:  Alert and  oriented x4;  grossly normal neurologically.  Impression/Plan: Holly Smith is here for an colonoscopy to be performed for diverticulitis  Risks, benefits, limitations, and alternatives regarding  colonoscopy have been reviewed with the patient.  Questions have been answered.  All parties agreeable.   Ollen Bowl, MD  06/10/2015, 9:15 AM

## 2015-06-10 NOTE — Anesthesia Preprocedure Evaluation (Signed)
Anesthesia Evaluation  Patient identified by MRN, date of birth, ID band Patient awake    Reviewed: Allergy & Precautions, H&P , NPO status , Patient's Chart, lab work & pertinent test results, reviewed documented beta blocker date and time   History of Anesthesia Complications (+) PONV and history of anesthetic complications  Airway Mallampati: III   Neck ROM: full    Dental  (+) Poor Dentition   Pulmonary neg pulmonary ROS, shortness of breath, sleep apnea , COPD, former smoker,    Pulmonary exam normal        Cardiovascular hypertension, +CHF  negative cardio ROS Normal cardiovascular exam Rate:Normal     Neuro/Psych negative neurological ROS  negative psych ROS   GI/Hepatic negative GI ROS, Neg liver ROS, GERD  ,  Endo/Other  negative endocrine ROS  Renal/GU Renal diseasenegative Renal ROS  negative genitourinary   Musculoskeletal   Abdominal   Peds  Hematology negative hematology ROS (+)   Anesthesia Other Findings Past Medical History:   HPV (human papilloma virus) anogenital infecti*                Comment:03/21/12 PAP + HR HPV   Abnormal Pap smear of vagina and vaginal HPV                 Hypertension                                                 Obesity                                                      Chronic anticoagulation                                        Comment:She is on Xarelto for Afib   Chronic atrial fibrillation ([facility])                            Echocardiogram abnormal                                        Comment:Mitral regurgitation, tricuspic regurgitation   Urolithiasis                                                 Postoperative nausea and vomiting                            CHF (congestive heart failure) ([facility])                         Insulin resistance  History of stress test                                         Comment:Myoview  8/16: EF 42%, anteroseptal,               inferoseptal, anterior, apical anterior and               apical defect (likely represents breast               attenuation versus scar), no ischemia;               intermediate risk because of low EF   Kidney stones                                                Vulvar cancer ([place])                             2016           Comment:invasive verrucous carcinoma of the vulva.               History of genital warts and vulvar condyloma   COPD (chronic obstructive pulmonary disease) (*                Comment:"pulmonary drs said I don't have COPD fall               2016" (01/23/2015)   OSA on CPAP                                                  GERD (gastroesophageal reflux disease)                       Arthritis                                                      Comment:"bones ache" (01/23/2015)   Cancer ([place])                                                   Comment:vulvar cancer Past Surgical History:   VULVECTOMY                                      Right 05/10/2014     Comment:Excisional biopsy of the superior right labial               majora mass; [facility]   BILATERAL SALPINGOOPHORECTOMY                    2003           Comment:benign ovarian cancer  LITHOTRIPSY                                                   VULVECTOMY PARTIAL                               07/02/2014     Comment:Re-excision and sentinel node dissection at               [facility].    TONSILLECTOMY                                                 APPENDECTOMY                                                  ABDOMINAL HYSTERECTOMY                                        CARDIOVERSION                                    2014-2015      Comment:"[facility]"   Reproductive/Obstetrics negative OB ROS                             Anesthesia Physical Anesthesia Plan  ASA: IV  Anesthesia Plan: General   Post-op Pain Management:     Induction:   Airway Management Planned:   Additional Equipment:   Intra-op Plan:   Post-operative Plan:   Informed Consent: I have reviewed the patients History and Physical, chart, labs and discussed the procedure including the risks, benefits and alternatives for the proposed anesthesia with the patient or authorized representative who has indicated his/her understanding and acceptance.   Dental Advisory Given  Plan Discussed with: CRNA  Anesthesia Plan Comments:         Anesthesia Quick Evaluation

## 2015-06-10 NOTE — Op Note (Signed)
[facility] Patient Name: Holly Smith Procedure Date: 06/10/2015 9:23 AM MRN: [id number] Account #: [id number] Date of Birth: 10-19-1945 Admit Type: Outpatient Age: 70 Room: [facility] ENDO ROOM 4 Gender: Female Note Status: Finalized Procedure:            Colonoscopy Indications:          Suspected diverticulitis Providers:            Lucilla Lame, MD Referring MD:         Juline Patch, MD (Referring MD) Medicines:            Propofol per Anesthesia Complications:        No immediate complications. Procedure:            Pre-Anesthesia Assessment:                       - Prior to the procedure, a History and Physical was                        performed, and patient medications and allergies were                        reviewed. The patient's tolerance of previous                        anesthesia was also reviewed. The risks and benefits of                        the procedure and the sedation options and risks were                        discussed with the patient. All questions were                        answered, and informed consent was obtained. Prior                        Anticoagulants: The patient has taken no previous                        anticoagulant or antiplatelet agents. ASA Grade                        Assessment: II - A patient with mild systemic disease.                        After reviewing the risks and benefits, the patient was                        deemed in satisfactory condition to undergo the                        procedure.                       After obtaining informed consent, the colonoscope was                        passed under direct vision. Throughout the procedure,  the patient's blood pressure, pulse, and oxygen                        saturations were monitored continuously. The                        Colonoscope was introduced through the anus and                        advanced to the  the cecum, identified by appendiceal                        orifice and ileocecal valve. The colonoscopy was                        performed without difficulty. The patient tolerated the                        procedure well. The quality of the bowel preparation                        was excellent. Findings:      The perianal and digital rectal examinations were normal.      A 3 mm polyp was found in the ascending colon. The polyp was sessile.       The polyp was removed with a cold snare. Resection was complete, but the       polyp tissue was not retrieved.      A 10 mm polyp was found in the descending colon. The polyp was sessile.       The polyp was removed with a cold snare. Resection and retrieval were       complete.      Four sessile polyps were found in the recto-sigmoid colon. The polyps       were 3 to 5 mm in size. These polyps were removed with a cold snare.       Resection and retrieval were complete.      Multiple small-mouthed diverticula were found in the sigmoid colon. Impression:           - One 3 mm polyp in the ascending colon, removed with a                        cold snare. Complete resection. Polyp tissue not                        retrieved.                       - One 10 mm polyp in the descending colon, removed with                        a cold snare. Resected and retrieved.                       - Four 3 to 5 mm polyps at the recto-sigmoid colon,                        removed with a cold snare. Resected and retrieved.                       -  Diverticulosis in the sigmoid colon. Recommendation:       - Repeat colonoscopy in 5 years if polyp adenoma and 10                        years if hyperplastic Procedure Code(s):    --- Professional ---                       [id number], Colonoscopy, flexible; with removal of tumor(s),                        polyp(s), or other lesion(s) by snare technique Diagnosis Code(s):    --- Professional ---                        D12.2, Benign neoplasm of ascending colon                       D12.4, Benign neoplasm of descending colon                       D12.7, Benign neoplasm of rectosigmoid junction CPT copyright 2016 [employer]. All rights reserved. The codes documented in this report are preliminary and upon coder review may  be revised to meet current compliance requirements. Lucilla Lame, MD 06/10/2015 9:51:02 AM This report has been signed electronically. Number of Addenda: 0 Note Initiated On: 06/10/2015 9:23 AM Scope Withdrawal Time: 0 hours 8 minutes 38 seconds  Total Procedure Duration: 0 hours 18 minutes 52 seconds       [facility]

## 2015-06-10 NOTE — Transfer of Care (Signed)
Immediate Anesthesia Transfer of Care Note  Patient: Holly Smith  Procedure(s) Performed: Procedure(s): COLONOSCOPY WITH PROPOFOL (N/A)  Patient Location: PACU  Anesthesia Type:General  Level of Consciousness: awake, alert  and oriented  Airway & Oxygen Therapy: Patient Spontanous Breathing and Patient connected to nasal cannula oxygen  Post-op Assessment: Report given to RN and Post -op Vital signs reviewed and stable  Post vital signs: Reviewed and stable  Last Vitals:  Filed Vitals:   06/10/15 0833  BP: 127/91  Pulse: 92  Temp: 36.2 C  Resp: 16    Complications: No apparent anesthesia complications

## 2015-06-11 ENCOUNTER — Encounter: Payer: Self-pay | Admitting: Gastroenterology

## 2015-06-11 LAB — SURGICAL PATHOLOGY

## 2015-06-12 ENCOUNTER — Encounter: Payer: Self-pay | Admitting: Gastroenterology

## 2015-07-24 DIAGNOSIS — H524 Presbyopia: Secondary | ICD-10-CM | POA: Diagnosis not present

## 2015-08-13 ENCOUNTER — Inpatient Hospital Stay: Payer: Medicare Other | Attending: Obstetrics and Gynecology | Admitting: Obstetrics and Gynecology

## 2015-08-13 VITALS — BP 144/86 | Temp 97.6°F | Ht 61.0 in | Wt 223.8 lb

## 2015-08-13 DIAGNOSIS — Z90710 Acquired absence of both cervix and uterus: Secondary | ICD-10-CM | POA: Diagnosis not present

## 2015-08-13 DIAGNOSIS — I4819 Other persistent atrial fibrillation: Secondary | ICD-10-CM

## 2015-08-13 DIAGNOSIS — K5732 Diverticulitis of large intestine without perforation or abscess without bleeding: Secondary | ICD-10-CM

## 2015-08-13 DIAGNOSIS — Z6841 Body Mass Index (BMI) 40.0 and over, adult: Secondary | ICD-10-CM | POA: Insufficient documentation

## 2015-08-13 DIAGNOSIS — K219 Gastro-esophageal reflux disease without esophagitis: Secondary | ICD-10-CM | POA: Diagnosis not present

## 2015-08-13 DIAGNOSIS — E785 Hyperlipidemia, unspecified: Secondary | ICD-10-CM | POA: Diagnosis not present

## 2015-08-13 DIAGNOSIS — C519 Malignant neoplasm of vulva, unspecified: Secondary | ICD-10-CM

## 2015-08-13 DIAGNOSIS — I10 Essential (primary) hypertension: Secondary | ICD-10-CM | POA: Diagnosis not present

## 2015-08-13 DIAGNOSIS — D125 Benign neoplasm of sigmoid colon: Secondary | ICD-10-CM

## 2015-08-13 DIAGNOSIS — D124 Benign neoplasm of descending colon: Secondary | ICD-10-CM

## 2015-08-13 DIAGNOSIS — N393 Stress incontinence (female) (male): Secondary | ICD-10-CM

## 2015-08-13 DIAGNOSIS — E669 Obesity, unspecified: Secondary | ICD-10-CM | POA: Insufficient documentation

## 2015-08-13 DIAGNOSIS — Z7901 Long term (current) use of anticoagulants: Secondary | ICD-10-CM | POA: Diagnosis not present

## 2015-08-13 DIAGNOSIS — Z8544 Personal history of malignant neoplasm of other female genital organs: Secondary | ICD-10-CM | POA: Diagnosis not present

## 2015-08-13 DIAGNOSIS — I071 Rheumatic tricuspid insufficiency: Secondary | ICD-10-CM | POA: Diagnosis not present

## 2015-08-13 DIAGNOSIS — R32 Unspecified urinary incontinence: Secondary | ICD-10-CM | POA: Diagnosis not present

## 2015-08-13 DIAGNOSIS — J449 Chronic obstructive pulmonary disease, unspecified: Secondary | ICD-10-CM | POA: Insufficient documentation

## 2015-08-13 DIAGNOSIS — Z79899 Other long term (current) drug therapy: Secondary | ICD-10-CM

## 2015-08-13 DIAGNOSIS — A630 Anogenital (venereal) warts: Secondary | ICD-10-CM | POA: Diagnosis not present

## 2015-08-13 DIAGNOSIS — Z87891 Personal history of nicotine dependence: Secondary | ICD-10-CM | POA: Diagnosis not present

## 2015-08-13 DIAGNOSIS — I5042 Chronic combined systolic (congestive) and diastolic (congestive) heart failure: Secondary | ICD-10-CM | POA: Diagnosis not present

## 2015-08-13 DIAGNOSIS — I481 Persistent atrial fibrillation: Secondary | ICD-10-CM

## 2015-08-13 DIAGNOSIS — D122 Benign neoplasm of ascending colon: Secondary | ICD-10-CM

## 2015-08-13 DIAGNOSIS — G4733 Obstructive sleep apnea (adult) (pediatric): Secondary | ICD-10-CM | POA: Diagnosis not present

## 2015-08-13 DIAGNOSIS — I340 Nonrheumatic mitral (valve) insufficiency: Secondary | ICD-10-CM

## 2015-08-13 DIAGNOSIS — E119 Type 2 diabetes mellitus without complications: Secondary | ICD-10-CM | POA: Diagnosis not present

## 2015-08-13 DIAGNOSIS — Z87898 Personal history of other specified conditions: Secondary | ICD-10-CM | POA: Insufficient documentation

## 2015-08-13 DIAGNOSIS — Z90722 Acquired absence of ovaries, bilateral: Secondary | ICD-10-CM | POA: Insufficient documentation

## 2015-08-13 DIAGNOSIS — E8881 Metabolic syndrome: Secondary | ICD-10-CM | POA: Insufficient documentation

## 2015-08-13 HISTORY — DX: Chronic obstructive pulmonary disease, unspecified: J44.9

## 2015-08-13 HISTORY — DX: Other persistent atrial fibrillation: I48.19

## 2015-08-13 NOTE — Progress Notes (Signed)
Gynecologic Oncology Interval Note  Referring Provider: Dr. Laverta Baltimore  Chief Concern: Vulvar cancer surveillance.   Subjective:  Holly Smith is a 70 y.o. woman who presents today for continued surveillance for history of invasive vulvar cancer.   She has mild vulvar irritation on right.    Still has urinary leakage for which she uses Vesicare.  PAP 3/16 normal.  No gyn complaints.   Oncology Treatment History:  Patient had warty lesion on the upper right labia for about two years.    Hysterectomy in the past for benign disease.  Then BSO in 2003 for benign ovarian tumor.  03/21/12 PAP + HR HPV vaginal bx 2 o'clock negative, 9 o'clock HPV effect  Vulvar itching and pain.  07/18/12 - vulvar bx showed condyloma  [id number] - vulvar bx - lichen sclerosis  [id number] - Exophytic mass on right anterior vulva. 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  Negative margins, but within 38mm of cancer.  No LVSI.    We discussed the rationale for re-excision of the scar to insure that there is no residual cancer present in view of the close 2 mm margin and sentinel lymph node mapping and biopsy because the tumor has > 51mm invasion.  Had partial right modified radical vulve[facility] on 07/02/14 at [facility] for invasive verrucous carcinoma of the vulva. The final pathology revealed: negative lymph nodes, no evidence of residual disease.      Problem List: Patient Active Problem List   Diagnosis Date Noted  . Persistent atrial fibrillation ([place]) 08/13/2015  . Chronic obstructive pulmonary disease ([place]) 08/13/2015  . Personal history of other specified conditions 08/13/2015  . Obesity, diabetes, and hypertension syndrome ([place]) 08/13/2015  . Adiposity 08/13/2015  . Special screening for malignant neoplasms, colon   . Benign neoplasm of ascending colon   . Benign neoplasm of descending colon   . Benign neoplasm of sigmoid colon   .  Chronic diastolic heart failure ([place]) 05/05/2015  . Diverticulitis 01/31/2015  . Diverticulitis large intestine w/o perforation or abscess w/bleeding 01/28/2015  . Chronic combined systolic and diastolic CHF, NYHA class 3 ([place]) 01/28/2015  . Acute diverticulitis 01/28/2015  . Diverticulitis of large intestine without perforation or abscess with bleeding   . Acute on chronic diastolic heart failure ([place]) 01/21/2015  . Hypertensive urgency 01/21/2015  . Morbid obesity ([place]) 01/21/2015  . Blood in stool 01/21/2015  . Acute diastolic CHF (congestive heart failure) ([place]) 01/21/2015  . CAFL (chronic airflow limitation) ([place]) 12/16/2014  . Personal history of perinatal problems 12/16/2014  . BP (high blood pressure) 12/16/2014  . HLD (hyperlipidemia) 12/16/2014  . Dysmetabolic syndrome [id number]  . Calculus of kidney 12/16/2014  . Beat, premature ventricular 12/16/2014  . Snores 12/16/2014  . Stress incontinence 12/16/2014  . Fast heart beat 12/16/2014  . Calculus (=stone) 12/16/2014  . OSA (obstructive sleep apnea) 11/27/2014  . Dyspnea 10/07/2014  . Essential hypertension 10/07/2014  . Obesity 10/07/2014  . Genital warts 08/02/2014  . Condyloma acuminatum of vulva 08/02/2014  . Condylomata lata of vulva 08/02/2014  . History of anticoagulant therapy 08/02/2014  . Chronic atrial fibrillation ([place]) 08/02/2014  . Current tobacco use 08/02/2014  . 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 Malignant neoplasm of vulva ([place]) 06/19/2014  . Vulvar cancer ([place]) 05/10/2014  . Vulvar cancer, carcinoma ([place]) 05/10/2014  . 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 Chest pain 08/20/2013  . Breath shortness  08/20/2013  . CCF (congestive cardiac failure) ([place]) 08/03/2013  . Congestive heart failure ([place]) 08/03/2013  . History of biliary T-tube placement 06/21/2013  . Abnormal echocardiogram 04/23/2013  . MI (mitral incompetence) 04/23/2013  . TI (tricuspid incompetence)  04/23/2013    Past Medical History: Past Medical History  Diagnosis Date  . HPV (human papilloma virus) anogenital infection     03/21/12 PAP + HR HPV  . Abnormal Pap smear of vagina and vaginal HPV   . Hypertension   . Obesity   . Chronic anticoagulation     She is on Xarelto for Afib  . Chronic atrial fibrillation ([place])   . Echocardiogram abnormal     Mitral regurgitation, tricuspic regurgitation  . Urolithiasis   . Postoperative nausea and vomiting   . CHF (congestive heart failure) ([place])   . Insulin resistance   . History of stress test     Myoview 8/16: EF 42%, anteroseptal, inferoseptal, anterior, apical anterior and apical defect (likely represents breast attenuation versus scar), no ischemia; intermediate risk because of low EF  . Kidney stones   . Vulvar cancer ([place]) 2016    invasive verrucous carcinoma of the vulva. History of genital warts and vulvar condyloma  . COPD (chronic obstructive pulmonary disease) ([place])     "pulmonary drs said I don't have COPD fall 2016" (01/23/2015)  . OSA on CPAP   . GERD (gastroesophageal reflux disease)   . Arthritis     "bones ache" (01/23/2015)  . Cancer [facility])     vulvar cancer    Past Surgical History: Past Surgical History  Procedure Laterality Date  . Vulvectomy Right 05/10/2014    Excisional biopsy of the superior right labial majora mass; [facility]  . Bilateral salpingoophorectomy  2003    benign ovarian cancer  . Lithotripsy    . Vulvectomy partial  07/02/2014    Re-excision and sentinel node dissection at [facility].   . Tonsillectomy    . Appendectomy    . Abdominal hysterectomy    . Cardioversion  2014-2015    "[place]"  . Colonoscopy with propofol N/A 06/10/2015    Procedure: COLONOSCOPY WITH PROPOFOL;  Surgeon: Lucilla Lame, MD;  Location: [facility] ENDOSCOPY;  Service: Endoscopy;  Laterality: N/A;    Family History: Family History  Problem Relation Age of Onset  . Prostate cancer Brother 77    still  living and well  . Heart attack Father   . Stroke Mother   . Diabetes Mother   . Hypertension Mother   . Heart attack Brother   . Pancreatic cancer Brother   . Healthy Sister     Social History: Social History   Social History  . Marital Status: Divorced    Spouse Name: N/A  . Number of Children: N/A  . Years of Education: N/A   Occupational History  . Not on file.   Social History Main Topics  . Smoking status: Former Smoker -- 1.00 packs/day for 50 years    Types: Cigarettes, E-cigarettes    Quit date: 05/20/2012  . Smokeless tobacco: Never Used  . Alcohol Use: 0.0 oz/week    0 Standard drinks or equivalent per week     Comment: 01/23/2015 "I' used to drink socially a few times/month"  . Drug Use: No  . Sexual Activity: Not Currently   Other Topics Concern  . Not on file   Social History Narrative    Allergies: Allergies  Allergen Reactions  . Ciprofloxacin  SOB    Current Medications: Current Outpatient Prescriptions  Medication Sig Dispense Refill  . albuterol (PROVENTIL HFA;VENTOLIN HFA) 108 (90 BASE) MCG/ACT inhaler Inhale 2 puffs into the lungs every 8 (eight) hours as needed for wheezing or shortness of breath. 1 Inhaler 6  . furosemide (LASIX) 40 MG tablet Take by mouth.    . irbesartan (AVAPRO) 75 MG tablet Take 37.5 mg by mouth daily.     . metoprolol succinate (TOPROL-XL) 100 MG 24 hr tablet Take 1 tablet (100 mg total) by mouth 2 (two) times daily. Take with or immediately following a meal. 60 tablet 3  . metoprolol succinate (TOPROL-XL) 50 MG 24 hr tablet Take 1 tablet (50 mg total) by mouth 2 (two) times daily. Take with or immediately following a meal. 60 tablet 6  . omeprazole (PRILOSEC) 20 MG capsule Take 20 mg by mouth daily before breakfast.    . rivaroxaban (XARELTO) 20 MG TABS tablet Take 1 tablet (20 mg total) by mouth daily with supper. 30 tablet 6  . saccharomyces boulardii (FLORASTOR) 250 MG capsule Take 1 capsule (250 mg total) by  mouth 2 (two) times daily. 30 capsule 0   No current facility-administered medications for this visit.      Review of Systems Pertinent items are noted in HPI.  Objective:  Physical Examination:  BP 144/86 mmHg  Temp(Src) 97.6 F (36.4 C) (Tympanic)  Ht 5\' 1"  (1.549 m)  Wt 223 lb 12.3 oz (101.5 kg)  BMI 42.30 kg/m2  ECOG Performance Status: 0 - Asymptomatic  General appearance: alert, cooperative and appears stated age HEENT:PERRLA, neck supple with midline trachea and thyroid without masses Lymph node survey: non-palpable, axillary, inguinal, supraclavicular Cardiovascular: regular rate and rhythm, no murmurs or gallops Respiratory: normal air entry, lungs clear to auscultation Breast exam: not examined. Abdomen: soft, non-tender, without masses or organomegaly, no hernias and well healed incision Back: inspection of back is normal Extremities: extremities normal, atraumatic, no cyanosis or edema Skin exam - normal coloration and turgor, no rashes, no suspicious skin lesions noted. Neurological exam reveals alert, oriented, normal speech, no focal findings or movement disorder noted.  Pelvic: exam chaperoned by nurse;  Vulva: normal appearing vulva with no masses, tenderness or lesions s/p partial vulvectomy; Vagina: normal vagina; Adnexa: normal adnexa in size, nontender and no masses; Rectal: normal rectal, no masses  Assessment:  Marcene Brawn with a history of invasive vulvar cancer s/p excision with negative SLNs.  No evidence of disease.   Plan:   Problem List Items Addressed This Visit      Musculoskeletal and Integument   Condyloma acuminatum of vulva - Primary     Genitourinary   Malignant neoplasm of vulva ([place])     Reassurance given.  Suggested return to clinic in  4 months.  She is comfortable with the plan and had her questions answered. I offered referral to Michelene Heady for management of incontinence and she may consider this next year.   Mellody Drown, MD  CC:  Juline Patch, MD [address] [place], [place] [address] [phone]

## 2015-08-13 NOTE — Progress Notes (Signed)
  Oncology Nurse Navigator Documentation  Navigator Location: [facility] (08/13/15 1000) Navigator Encounter Type: [place] Follow-up (08/13/15 1000)           Patient Visit Type: Follow-up (08/13/15 1000)                              Time Spent with Patient: 15 (08/13/15 1000)   Chaperoned with pelvic exam. Follow up in four months

## 2015-09-11 ENCOUNTER — Encounter: Payer: Self-pay | Admitting: Pulmonary Disease

## 2015-10-23 ENCOUNTER — Ambulatory Visit (INDEPENDENT_AMBULATORY_CARE_PROVIDER_SITE_OTHER): Payer: Medicare Other | Admitting: Interventional Cardiology

## 2015-10-23 ENCOUNTER — Other Ambulatory Visit: Payer: Self-pay | Admitting: Family Medicine

## 2015-10-23 ENCOUNTER — Encounter: Payer: Self-pay | Admitting: Interventional Cardiology

## 2015-10-23 VITALS — BP 140/80 | HR 87 | Ht 61.0 in | Wt 228.2 lb

## 2015-10-23 DIAGNOSIS — I5032 Chronic diastolic (congestive) heart failure: Secondary | ICD-10-CM | POA: Diagnosis not present

## 2015-10-23 DIAGNOSIS — E669 Obesity, unspecified: Secondary | ICD-10-CM

## 2015-10-23 DIAGNOSIS — I10 Essential (primary) hypertension: Secondary | ICD-10-CM

## 2015-10-23 DIAGNOSIS — M79601 Pain in right arm: Secondary | ICD-10-CM

## 2015-10-23 DIAGNOSIS — I482 Chronic atrial fibrillation, unspecified: Secondary | ICD-10-CM

## 2015-10-23 DIAGNOSIS — M79602 Pain in left arm: Secondary | ICD-10-CM

## 2015-10-23 NOTE — Patient Instructions (Signed)
Medication Instructions:  Same-no changes  Labwork: None  Testing/Procedures: None  Follow-Up: Your physician wants you to follow-up in: 6 months. You will receive a reminder letter in the mail two months in advance. If you don't receive a letter, please call our office to schedule the follow-up appointment.      If you need a refill on your cardiac medications before your next appointment, please call your pharmacy.   

## 2015-10-23 NOTE — Progress Notes (Signed)
Patient ID: Holly Smith, female   DOB: [birth year], 70 y.o.   MRN: [id number]     Cardiology Office Note   Date:  10/23/2015   ID:  Holly, Smith [birth year], MRN [id number]  PCP:  Otilio Miu, MD    No chief complaint on file. f/u [facility]   Wt Readings from Last 3 Encounters:  10/23/15 228 lb 3.2 oz (103.5 kg)  08/13/15 223 lb 12.3 oz (101.5 kg)  06/10/15 216 lb (98 kg)       History of Present Illness: Holly Smith is a 70 y.o. female  Who had PAF and a tachycardia mediated cardiomyopathy in 2015.  She was cardioverted at [facility].    She saw me for the first time in 2016.  She feels better taking her diuretics.  She had lost 20+ lbs, but gained some of it back. She has to stay under her 2000 mg of sodium requirement and she will feel ok.    Dr. Rayann Heman had talked to her about Tikosyn.  She instead took more metoprolol, although not as much as he prescribed.  She is only taking metoprolol 100 mg BID.    She developed diverticulitis and needs a f/u colonoscopy.  Overall, her breathing is better.  Still was some DOE.  She has occsaional edema. Better with leg elevation.   No palpitations. Does feel palpitations if she misses the metoprolol.  No orthopnea.  SHe uses 2 pillows.  She uses CPAP.  Occasional left arm pain that radiates to her right arm.  Worse with twisting or pulling the arm.      Past Medical History:  Diagnosis Date  . Abnormal Pap smear of vagina and vaginal HPV   . Arthritis    "bones ache" (01/23/2015)  . Cancer ([place])    vulvar cancer  . CHF (congestive heart failure) ([place])   . Chronic anticoagulation    She is on Xarelto for Afib  . Chronic atrial fibrillation ([place])   . COPD (chronic obstructive pulmonary disease) ([place])    "pulmonary drs said I don't have COPD fall 2016" (01/23/2015)  . Echocardiogram abnormal    Mitral regurgitation, tricuspic regurgitation  . GERD (gastroesophageal reflux disease)   . History of stress test    Myoview 8/16: EF 42%, anteroseptal, inferoseptal, anterior, apical anterior and apical defect (likely represents breast attenuation versus scar), no ischemia; intermediate risk because of low EF  . HPV (human papilloma virus) anogenital infection    03/21/12 PAP + HR HPV  . Hypertension   . Insulin resistance   . Kidney stones   . Obesity   . OSA on CPAP   . Postoperative nausea and vomiting   . Urolithiasis   . Vulvar cancer ([place]) 2016   invasive verrucous carcinoma of the vulva. History of genital warts and vulvar condyloma    Past Surgical History:  Procedure Laterality Date  . ABDOMINAL HYSTERECTOMY    . APPENDECTOMY    . BILATERAL SALPINGOOPHORECTOMY  2003   benign ovarian cancer  . CARDIOVERSION  2014-2015   "[place]"  . COLONOSCOPY WITH PROPOFOL N/A 06/10/2015   Procedure: COLONOSCOPY WITH PROPOFOL;  Surgeon: Lucilla Lame, MD;  Location: [facility] ENDOSCOPY;  Service: Endoscopy;  Laterality: N/A;  . LITHOTRIPSY    . TONSILLECTOMY    . VULVECTOMY Right 05/10/2014   Excisional biopsy of the superior right labial majora mass; [place] [facility]  07/02/2014   Re-excision and sentinel node dissection at [facility].  Current Outpatient Prescriptions  Medication Sig Dispense Refill  . albuterol (PROVENTIL HFA;VENTOLIN HFA) 108 (90 BASE) MCG/ACT inhaler Inhale 2 puffs into the lungs every 8 (eight) hours as needed for wheezing or shortness of breath. 1 Inhaler 6  . furosemide (LASIX) 40 MG tablet Take 20 mg by mouth daily.     . irbesartan (AVAPRO) 75 MG tablet Take 37.5 mg by mouth daily.     . metoprolol succinate (TOPROL-XL) 100 MG 24 hr tablet Take 1 tablet (100 mg total) by mouth 2 (two) times daily. Take with or immediately following a meal. 60 tablet 3  . metoprolol succinate (TOPROL-XL) 50 MG 24 hr tablet Take 1 tablet (50 mg total) by mouth 2 (two) times daily. Take with or immediately following a meal. 60 tablet 6  . omeprazole (PRILOSEC) 20 MG  capsule Take 20 mg by mouth daily before breakfast.    . rivaroxaban (XARELTO) 20 MG TABS tablet Take 1 tablet (20 mg total) by mouth daily with supper. 30 tablet 6   No current facility-administered medications for this visit.     Allergies:   Ciprofloxacin    Social History:  The patient  reports that she quit smoking about 3 years ago. Her smoking use included Cigarettes and E-cigarettes. She has a 50.00 pack-year smoking history. She has never used smokeless tobacco. She reports that she drinks alcohol. She reports that she does not use drugs.   Family History:  The patient's family history includes Diabetes in her mother; Healthy in her sister; Heart attack in her brother and father; Hypertension in her mother; Pancreatic cancer in her brother; Prostate cancer (age of onset: 43) in her brother; Stroke in her mother.    ROS:  Please see the history of present illness.   Otherwise, review of systems are positive for DOE-improving.   All other systems are reviewed and negative.    PHYSICAL EXAM: VS:  BP 140/80   Pulse 87   Ht 5\' 1"  (1.549 m)   Wt 228 lb 3.2 oz (103.5 kg)   BMI 43.12 kg/m  , BMI Body mass index is 43.12 kg/m. GEN: Well nourished, well developed, in no acute distress  HEENT: normal  Neck: no JVD, carotid bruits, or masses Cardiac: RRR; no murmurs, rubs, or gallops,no edema  Respiratory:  clear to auscultation bilaterally, normal work of breathing GI: soft, nontender, nondistended, + BS, obese MS: no deformity or atrophy  Skin: warm and dry, no rash Neuro:  Strength and sensation are intact Psych: euthymic mood, full affect    Recent Labs: 01/21/2015: B Natriuretic Peptide 651.5 01/28/2015: Magnesium 2.1 01/29/2015: TSH 1.023 01/31/2015: ALT 12; Hemoglobin 11.8; Platelets 227 02/07/2015: BUN 15; Creat 1.07; Potassium 4.4; Sodium 137   Lipid Panel    Component Value Date/Time   CHOL 118 01/22/2015 0455   TRIG 103 01/22/2015 0455   HDL 27 (L) 01/22/2015  0455   CHOLHDL 4.4 01/22/2015 0455   VLDL 21 01/22/2015 0455   LDLCALC 70 01/22/2015 0455     Other studies Reviewed: Additional studies/ records that were reviewed today with results demonstrating: I reviewed the EP note..   ASSESSMENT AND PLAN:  1. AFib: rate controlled.  HR 87 by ECG. Stroke prevention with Xarelto. Increased metoprolol to 150 BID per Dr. Jackalyn Lombard instructions. Continues Xarelto for stroke prevention. Her symptoms are well controlled. I don't think she needs to try Tikosyn at this point.  Rate controlled.  2. Chronic diastolic heart failure: Better controlled with weight  loss and higher dose of diuretic. Now taking 80 mg of Lasix daily.  Try to get back to losing weight.  3. Hypertension: Continue current blood pressure medicines.  Blood pressure well controlled. 4. Obesity: I encouraged her to continue to try to lose weight as she is done in the last few months. 5. Arm pain: I don't think this is cardiac related. She had a low risk stress test in the past.   Current medicines are reviewed at length with the patient today.  The patient concerns regarding her medicines were addressed.  The following changes have been made:  As above  Labs/ tests ordered today include:  No orders of the defined types were placed in this encounter.   Recommend 150 minutes/week of aerobic exercise Low fat, low carb, high fiber diet recommended  Disposition:   FU in 6 months   Signed, Larae Grooms, MD  10/23/2015 10:00 AM    [address], [place], [place]  [address] Phone: [phone]; Fax: [phone]

## 2015-11-05 ENCOUNTER — Other Ambulatory Visit: Payer: Self-pay | Admitting: Internal Medicine

## 2015-11-14 ENCOUNTER — Other Ambulatory Visit: Payer: Self-pay | Admitting: Interventional Cardiology

## 2015-11-17 ENCOUNTER — Other Ambulatory Visit: Payer: Self-pay | Admitting: *Deleted

## 2015-11-17 MED ORDER — IRBESARTAN 75 MG PO TABS
75.0000 mg | ORAL_TABLET | Freq: Every day | ORAL | 6 refills | Status: DC
Start: 2015-11-17 — End: 2016-12-16

## 2015-11-17 MED ORDER — OMEPRAZOLE 20 MG PO CPDR: DELAYED_RELEASE_CAPSULE | ORAL | 3 refills | Status: DC

## 2015-11-18 ENCOUNTER — Other Ambulatory Visit: Payer: Self-pay | Admitting: Interventional Cardiology

## 2015-11-20 ENCOUNTER — Other Ambulatory Visit: Payer: Self-pay

## 2015-11-26 ENCOUNTER — Encounter: Payer: Self-pay | Admitting: Obstetrics and Gynecology

## 2015-11-26 ENCOUNTER — Encounter (INDEPENDENT_AMBULATORY_CARE_PROVIDER_SITE_OTHER): Payer: Self-pay

## 2015-11-26 ENCOUNTER — Inpatient Hospital Stay: Payer: Medicare Other | Attending: Obstetrics and Gynecology | Admitting: Obstetrics and Gynecology

## 2015-11-26 VITALS — BP 121/85 | HR 76 | Temp 97.3°F | Ht 60.0 in | Wt 226.1 lb

## 2015-11-26 DIAGNOSIS — Z79899 Other long term (current) drug therapy: Secondary | ICD-10-CM | POA: Insufficient documentation

## 2015-11-26 DIAGNOSIS — Z90722 Acquired absence of ovaries, bilateral: Secondary | ICD-10-CM | POA: Insufficient documentation

## 2015-11-26 DIAGNOSIS — N393 Stress incontinence (female) (male): Secondary | ICD-10-CM | POA: Diagnosis not present

## 2015-11-26 DIAGNOSIS — A630 Anogenital (venereal) warts: Secondary | ICD-10-CM

## 2015-11-26 DIAGNOSIS — Z7901 Long term (current) use of anticoagulants: Secondary | ICD-10-CM | POA: Diagnosis not present

## 2015-11-26 DIAGNOSIS — J449 Chronic obstructive pulmonary disease, unspecified: Secondary | ICD-10-CM

## 2015-11-26 DIAGNOSIS — E785 Hyperlipidemia, unspecified: Secondary | ICD-10-CM

## 2015-11-26 DIAGNOSIS — I482 Chronic atrial fibrillation: Secondary | ICD-10-CM | POA: Insufficient documentation

## 2015-11-26 DIAGNOSIS — Z87891 Personal history of nicotine dependence: Secondary | ICD-10-CM

## 2015-11-26 DIAGNOSIS — I10 Essential (primary) hypertension: Secondary | ICD-10-CM | POA: Insufficient documentation

## 2015-11-26 DIAGNOSIS — Z90710 Acquired absence of both cervix and uterus: Secondary | ICD-10-CM

## 2015-11-26 DIAGNOSIS — I340 Nonrheumatic mitral (valve) insufficiency: Secondary | ICD-10-CM | POA: Insufficient documentation

## 2015-11-26 DIAGNOSIS — K5732 Diverticulitis of large intestine without perforation or abscess without bleeding: Secondary | ICD-10-CM | POA: Diagnosis not present

## 2015-11-26 DIAGNOSIS — Z8544 Personal history of malignant neoplasm of other female genital organs: Secondary | ICD-10-CM | POA: Diagnosis present

## 2015-11-26 DIAGNOSIS — G4733 Obstructive sleep apnea (adult) (pediatric): Secondary | ICD-10-CM | POA: Diagnosis not present

## 2015-11-26 DIAGNOSIS — M1990 Unspecified osteoarthritis, unspecified site: Secondary | ICD-10-CM | POA: Diagnosis not present

## 2015-11-26 DIAGNOSIS — C519 Malignant neoplasm of vulva, unspecified: Secondary | ICD-10-CM

## 2015-11-26 DIAGNOSIS — I361 Nonrheumatic tricuspid (valve) insufficiency: Secondary | ICD-10-CM | POA: Diagnosis not present

## 2015-11-26 DIAGNOSIS — I5043 Acute on chronic combined systolic (congestive) and diastolic (congestive) heart failure: Secondary | ICD-10-CM

## 2015-11-26 DIAGNOSIS — Z6841 Body Mass Index (BMI) 40.0 and over, adult: Secondary | ICD-10-CM | POA: Diagnosis not present

## 2015-11-26 NOTE — Progress Notes (Signed)
Gynecologic Oncology Interval Note  Referring Provider: Dr. Laverta Baltimore  Chief Concern: Vulvar cancer surveillance.   Subjective:  Holly Smith is a 70 y.o. woman who presents today for continued surveillance for history of invasive vulvar cancer.   She has mild vulvar irritation on right as in the past.  No new lesions or other complaints.   Still has urinary leakage for which she uses Vesicare.  PAP 3/16 normal.  No gyn complaints.   Oncology Treatment History:  Patient had warty lesion on the upper right labia for about two years.    Hysterectomy in the past for benign disease.  Then BSO in 2003 for benign ovarian tumor.  03/21/12 PAP + HR HPV vaginal bx 2 o'clock negative, 9 o'clock HPV effect  Vulvar itching and pain.  07/18/12 - vulvar bx showed condyloma  [id number] - vulvar bx - lichen sclerosis  [id number] - Exophytic mass on right anterior vulva. 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  Negative margins, but within 49mm of cancer.  No LVSI.    We discussed the rationale for re-excision of the scar to insure that there is no residual cancer present in view of the close 2 mm margin and sentinel lymph node mapping and biopsy because the tumor has > 81mm invasion.  Had partial right modified radical vulvectomy on 07/02/14 at [facility] for invasive verrucous carcinoma of the vulva. The final pathology revealed: negative lymph nodes, no evidence of residual disease.      Problem List: Patient Active Problem List   Diagnosis Date Noted  . Persistent atrial fibrillation ([place]) 08/13/2015  . Chronic obstructive pulmonary disease ([place]) 08/13/2015  . Personal history of other specified conditions 08/13/2015  . Obesity, diabetes, and hypertension syndrome ([place]) 08/13/2015  . Adiposity 08/13/2015  . Special screening for malignant neoplasms, colon   . Benign neoplasm of ascending colon   . Benign neoplasm of descending  colon   . Benign neoplasm of sigmoid colon   . Chronic diastolic heart failure ([place]) 05/05/2015  . Diverticulitis 01/31/2015  . Diverticulitis large intestine w/o perforation or abscess w/bleeding 01/28/2015  . Chronic combined systolic and diastolic CHF, NYHA class 3 ([place]) 01/28/2015  . Acute diverticulitis 01/28/2015  . Diverticulitis of large intestine without perforation or abscess with bleeding   . Acute on chronic diastolic heart failure ([place]) 01/21/2015  . Hypertensive urgency 01/21/2015  . Morbid obesity ([place]) 01/21/2015  . Blood in stool 01/21/2015  . Acute diastolic CHF (congestive heart failure) ([place]) 01/21/2015  . CAFL (chronic airflow limitation) ([place]) 12/16/2014  . Personal history of perinatal problems 12/16/2014  . BP (high blood pressure) 12/16/2014  . HLD (hyperlipidemia) 12/16/2014  . Dysmetabolic syndrome [id number]  . Calculus of kidney 12/16/2014  . Beat, premature ventricular 12/16/2014  . Snores 12/16/2014  . Stress incontinence 12/16/2014  . Fast heart beat 12/16/2014  . Calculus (=stone) 12/16/2014  . OSA (obstructive sleep apnea) 11/27/2014  . Dyspnea 10/07/2014  . Essential hypertension 10/07/2014  . Obesity 10/07/2014  . Genital warts 08/02/2014  . Condyloma acuminatum of vulva 08/02/2014  . Condylomata lata of vulva 08/02/2014  . History of anticoagulant therapy 08/02/2014  . Chronic atrial fibrillation ([place]) 08/02/2014  . Current tobacco use 08/02/2014  . 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 Malignant neoplasm of vulva ([place]) 06/19/2014  . Vulvar cancer ([place]) 05/10/2014  . Vulvar cancer, carcinoma ([place]) 05/10/2014  . 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  03/26/2014  . Chest pain 08/20/2013  . Breath shortness 08/20/2013  . CCF (congestive cardiac failure) ([place]) 08/03/2013  . Congestive heart failure ([place]) 08/03/2013  . History of biliary T-tube placement 06/21/2013  . Abnormal echocardiogram 04/23/2013  . MI (mitral incompetence)  04/23/2013  . TI (tricuspid incompetence) 04/23/2013    Past Medical History: Past Medical History:  Diagnosis Date  . Abnormal Pap smear of vagina and vaginal HPV   . Arthritis    "bones ache" (01/23/2015)  . Cancer ([place])    vulvar cancer  . CHF (congestive heart failure) ([place])   . Chronic anticoagulation    She is on Xarelto for Afib  . Chronic atrial fibrillation ([place])   . COPD (chronic obstructive pulmonary disease) ([place])    "pulmonary drs said I don't have COPD fall 2016" (01/23/2015)  . Echocardiogram abnormal    Mitral regurgitation, tricuspic regurgitation  . GERD (gastroesophageal reflux disease)   . History of stress test    Myoview 8/16: EF 42%, anteroseptal, inferoseptal, anterior, apical anterior and apical defect (likely represents breast attenuation versus scar), no ischemia; intermediate risk because of low EF  . HPV (human papilloma virus) anogenital infection    03/21/12 PAP + HR HPV  . Hypertension   . Insulin resistance   . Kidney stones   . Obesity   . OSA on CPAP   . Postoperative nausea and vomiting   . Urolithiasis   . Vulvar cancer ([place]) 2016   invasive verrucous carcinoma of the vulva. History of genital warts and vulvar condyloma    Past Surgical History: Past Surgical History:  Procedure Laterality Date  . ABDOMINAL HYSTERECTOMY    . APPENDECTOMY    . BILATERAL SALPINGOOPHORECTOMY  2003   benign ovarian cancer  . CARDIOVERSION  2014-2015   "[place]"  . COLONOSCOPY WITH PROPOFOL N/A 06/10/2015   Procedure: COLONOSCOPY WITH PROPOFOL;  Surgeon: Lucilla Lame, MD;  Location: [facility] ENDOSCOPY;  Service: Endoscopy;  Laterality: N/A;  . LITHOTRIPSY    . TONSILLECTOMY    . VULVECTOMY Right 05/10/2014   Excisional biopsy of the superior right labial majora mass; [place] PARTIAL  07/02/2014   Re-excision and sentinel node dissection at [facility].     Family History: Family History  Problem Relation Age of Onset  .  Prostate cancer Brother 73    still living and well  . Heart attack Father   . Stroke Mother   . Diabetes Mother   . Hypertension Mother   . Heart attack Brother   . Pancreatic cancer Brother   . Healthy Sister     Social History: Social History   Social History  . Marital status: Divorced    Spouse name: N/A  . Number of children: N/A  . Years of education: N/A   Occupational History  . Not on file.   Social History Main Topics  . Smoking status: Former Smoker    Packs/day: 1.00    Years: 50.00    Types: Cigarettes, E-cigarettes    Quit date: 05/20/2012  . Smokeless tobacco: Never Used  . Alcohol use 0.0 oz/week     Comment: 01/23/2015 "I' used to drink socially a few times/month"  . Drug use: No  . Sexual activity: Not Currently   Other Topics Concern  . Not on file   Social History Narrative  . No narrative on file    Allergies: Allergies  Allergen Reactions  . Ciprofloxacin Other (See Comments)  SOB    Current Medications: Current Outpatient Prescriptions  Medication Sig Dispense Refill  . albuterol (PROVENTIL HFA;VENTOLIN HFA) 108 (90 BASE) MCG/ACT inhaler Inhale 2 puffs into the lungs every 8 (eight) hours as needed for wheezing or shortness of breath. 1 Inhaler 6  . furosemide (LASIX) 40 MG tablet Take 80 mg by mouth daily.    . irbesartan (AVAPRO) 75 MG tablet Take 1 tablet (75 mg total) by mouth daily. 30 tablet 6  . metoprolol succinate (TOPROL-XL) 100 MG 24 hr tablet Take 1 tablet (100 mg total) by mouth 2 (two) times daily. Take with or immediately following a meal. 60 tablet 3  . metoprolol succinate (TOPROL-XL) 50 MG 24 hr tablet Take 1 tablet (50 mg total) by mouth 2 (two) times daily. Take with or immediately following a meal. 60 tablet 6  . omeprazole (PRILOSEC) 20 MG capsule TAKE 1 CAPSULE (20 MG TOTAL) BY MOUTH ONCE DAILY. 90 capsule 3  . rivaroxaban (XARELTO) 20 MG TABS tablet Take 1 tablet (20 mg total) by mouth daily with supper. 30  tablet 6   No current facility-administered medications for this visit.       Review of Systems Pertinent items are noted in HPI.  Objective:  Physical Examination:  BP 121/85 (BP Location: Left Arm, Patient Position: Sitting)   Pulse 76   Temp 97.3 F (36.3 C) (Tympanic)   Ht 5' (1.524 m)   Wt 226 lb 1.3 oz (102.6 kg)   BMI 44.15 kg/m   ECOG Performance Status: 0 - Asymptomatic  General appearance: alert, cooperative and appears stated age HEENT:PERRLA, neck supple with midline trachea and thyroid without masses Lymph node survey: non-palpable, axillary, inguinal, supraclavicular Cardiovascular: regular rate and rhythm, no murmurs or gallops Respiratory: normal air entry, lungs clear to auscultation Breast exam: not examined. Abdomen: soft, non-tender, without masses or organomegaly, no hernias and well healed incision Back: inspection of back is normal Extremities: extremities normal, atraumatic, no cyanosis or edema Skin exam - normal coloration and turgor, no rashes, no suspicious skin lesions noted. Neurological exam reveals alert, oriented, normal speech, no focal findings or movement disorder noted.  Pelvic: exam chaperoned by nurse;  Vulva: normal appearing vulva with no masses, tenderness or lesions s/p partial vulvectomy; Urethral caruncle; Vagina: normal vagina; Adnexa: normal adnexa in size, nontender and no masses; Rectal: normal rectal, no masses.  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  No evidence of disease.   Plan:   Problem List Items Addressed This Visit      Genitourinary   Malignant neoplasm of vulva ([place]) - Primary    Other Visit Diagnoses   None.    Reassurance given.  Suggested return to clinic in  4 months.  She is comfortable with the plan and had her questions answered. I offered referral to Michelene Heady for management of incontinence and she may consider this next year.   Mellody Drown, MD  CC:  Juline Patch, MD [address] [place], [place] [address] [phone]

## 2015-11-26 NOTE — Progress Notes (Signed)
Patient here for follow up. No complaints today. 

## 2015-11-26 NOTE — Progress Notes (Signed)
  Oncology Nurse Navigator Documentation  Navigator Location: [facility] (11/26/15 1500) Navigator Encounter Type: Clinic/MDC (11/26/15 1500)                                          Time Spent with Patient: 15 (11/26/15 1500)   Chaperoned pelvic exam. Follow up in 4 months.

## 2015-12-01 ENCOUNTER — Other Ambulatory Visit: Payer: Self-pay | Admitting: Interventional Cardiology

## 2015-12-10 ENCOUNTER — Ambulatory Visit: Payer: Self-pay

## 2015-12-17 ENCOUNTER — Other Ambulatory Visit: Payer: Self-pay | Admitting: Interventional Cardiology

## 2015-12-17 ENCOUNTER — Ambulatory Visit: Payer: Self-pay

## 2016-03-18 ENCOUNTER — Encounter: Payer: Self-pay | Admitting: Family Medicine

## 2016-03-18 ENCOUNTER — Ambulatory Visit (INDEPENDENT_AMBULATORY_CARE_PROVIDER_SITE_OTHER): Payer: Medicare Other | Admitting: Family Medicine

## 2016-03-18 VITALS — BP 112/70 | HR 70 | Ht 60.0 in | Wt 226.0 lb

## 2016-03-18 DIAGNOSIS — E8881 Metabolic syndrome: Secondary | ICD-10-CM

## 2016-03-18 DIAGNOSIS — Z1239 Encounter for other screening for malignant neoplasm of breast: Secondary | ICD-10-CM

## 2016-03-18 DIAGNOSIS — M25872 Other specified joint disorders, left ankle and foot: Secondary | ICD-10-CM

## 2016-03-18 DIAGNOSIS — Z0000 Encounter for general adult medical examination without abnormal findings: Secondary | ICD-10-CM

## 2016-03-18 DIAGNOSIS — Z1231 Encounter for screening mammogram for malignant neoplasm of breast: Secondary | ICD-10-CM | POA: Diagnosis not present

## 2016-03-18 DIAGNOSIS — M7752 Other enthesopathy of left foot: Secondary | ICD-10-CM

## 2016-03-18 DIAGNOSIS — F329 Major depressive disorder, single episode, unspecified: Secondary | ICD-10-CM

## 2016-03-18 MED ORDER — SERTRALINE HCL 25 MG PO TABS: 25.0000 mg | ORAL_TABLET | Freq: Every day | ORAL | 1 refills | Status: DC

## 2016-03-18 NOTE — Progress Notes (Signed)
Name: Holly Smith   MRN: [id number]    DOB: 20-Jul-1945   Date:03/18/2016       Progress Note  Subjective  Chief Complaint  Chief Complaint  Patient presents with  . Annual Exam    no pap- needs mammo  . Depression    needs low dose antidepressant    Patient presents for annual physical exam.   Depression       The patient presents with depression.  This is a new problem.  The current episode started more than 1 month ago.   The onset quality is gradual.   The problem occurs daily.  The problem has been gradually worsening since onset.  Associated symptoms include hopelessness, decreased interest and sad.  Associated symptoms include no decreased concentration, no fatigue, no helplessness, does not have insomnia, not irritable, no restlessness, no appetite change, no body aches, no myalgias, no headaches, no indigestion and no suicidal ideas.     The symptoms are aggravated by nothing.  Past treatments include nothing.  Compliance with treatment is good.  Past compliance problems include difficulty understanding directions.  Previous treatment provided mild relief.  Past medical history includes depression.     Pertinent negatives include no chronic illness, no physical disability, no terminal illness, no dementia and no anxiety.   No problem-specific Assessment & Plan notes found for this encounter.   Past Medical History:  Diagnosis Date  . Abnormal Pap smear of vagina and vaginal HPV   . Arthritis    "bones ache" (01/23/2015)  . Cancer ([place])    vulvar cancer  . CHF (congestive heart failure) ([place])   . Chronic anticoagulation    She is on Xarelto for Afib  . Chronic atrial fibrillation ([place])   . COPD (chronic obstructive pulmonary disease) ([place])    "pulmonary drs said I don't have COPD fall 2016" (01/23/2015)  . Echocardiogram abnormal    Mitral regurgitation, tricuspic regurgitation  . GERD (gastroesophageal reflux disease)   . History of stress test    Myoview 8/16:  EF 42%, anteroseptal, inferoseptal, anterior, apical anterior and apical defect (likely represents breast attenuation versus scar), no ischemia; intermediate risk because of low EF  . HPV (human papilloma virus) anogenital infection    03/21/12 PAP + HR HPV  . Hypertension   . Insulin resistance   . Kidney stones   . Obesity   . OSA on CPAP   . Postoperative nausea and vomiting   . Urolithiasis   . Vulvar cancer ([place]) 2016   invasive verrucous carcinoma of the vulva. History of genital warts and vulvar condyloma    Past Surgical History:  Procedure Laterality Date  . ABDOMINAL HYSTERECTOMY    . APPENDECTOMY    . BILATERAL SALPINGOOPHORECTOMY  2003   benign ovarian cancer  . CARDIOVERSION  2014-2015   "[place]"  . COLONOSCOPY WITH PROPOFOL N/A 06/10/2015   Procedure: COLONOSCOPY WITH PROPOFOL;  Surgeon: Lucilla Lame, MD;  Location: [facility] ENDOSCOPY;  Service: Endoscopy;  Laterality: N/A;  . LITHOTRIPSY    . TONSILLECTOMY    . VULVECTOMY Right 05/10/2014   Excisional biopsy of the superior right labial majora mass; [place] PARTIAL  07/02/2014   Re-excision and sentinel node dissection at [facility].     Family History  Problem Relation Age of Onset  . Prostate cancer Brother 26    still living and well  . Heart attack Father   . Stroke Mother   . Diabetes Mother   .  Hypertension Mother   . Heart attack Brother   . Pancreatic cancer Brother   . Healthy Sister     Social History   Social History  . Marital status: Divorced    Spouse name: N/A  . Number of children: N/A  . Years of education: N/A   Occupational History  . Not on file.   Social History Main Topics  . Smoking status: Former Smoker    Packs/day: 1.00    Years: 50.00    Types: Cigarettes, E-cigarettes    Quit date: 05/20/2012  . Smokeless tobacco: Never Used  . Alcohol use 0.0 oz/week     Comment: 01/23/2015 "I' used to drink socially a few times/month"  . Drug use: No  .  Sexual activity: Not Currently   Other Topics Concern  . Not on file   Social History Narrative  . No narrative on file    Allergies  Allergen Reactions  . Ciprofloxacin Other (See Comments)    SOB     Review of Systems  Constitutional: Negative for appetite change, chills, fatigue, fever, malaise/fatigue and weight loss.  HENT: Negative for ear discharge, ear pain and sore throat.   Eyes: Negative for blurred vision.  Respiratory: Negative for cough, sputum production, shortness of breath and wheezing.   Cardiovascular: Negative for chest pain, palpitations and leg swelling.  Gastrointestinal: Negative for abdominal pain, blood in stool, constipation, diarrhea, heartburn, melena and nausea.  Genitourinary: Negative for dysuria, frequency, hematuria and urgency.  Musculoskeletal: Negative for back pain, joint pain, myalgias and neck pain.  Skin: Negative for rash.  Neurological: Negative for dizziness, tingling, sensory change, focal weakness and headaches.  Endo/Heme/Allergies: Negative for environmental allergies and polydipsia. Does not bruise/bleed easily.  Psychiatric/Behavioral: Positive for depression. Negative for decreased concentration and suicidal ideas. The patient is not nervous/anxious and does not have insomnia.      Objective  Vitals:   03/18/16 0954  BP: 112/70  Pulse: 70  Weight: 226 lb (102.5 kg)  Height: 5' (1.524 m)    Physical Exam  Constitutional: She is oriented to person, place, and time and well-developed, well-nourished, and in no distress. Vital signs are normal. She is not irritable. No distress.  HENT:  Head: Normocephalic and atraumatic.  Right Ear: Tympanic membrane, external ear and ear canal normal.  Left Ear: Tympanic membrane, external ear and ear canal normal.  Nose: Nose normal.  Mouth/Throat: Uvula is midline, oropharynx is clear and moist and mucous membranes are normal.  Eyes: Conjunctivae, EOM and lids are normal. Pupils are  equal, round, and reactive to light. Right eye exhibits no discharge. Left eye exhibits no discharge.  Fundoscopic exam:      The right eye shows no arteriolar narrowing, no AV nicking and no papilledema.       The left eye shows no arteriolar narrowing, no AV nicking and no papilledema.  Neck: Trachea normal, normal range of motion and full passive range of motion without pain. Neck supple. Normal carotid pulses, no hepatojugular reflux and no JVD present. Carotid bruit is not present. No thyromegaly present.  Cardiovascular: Normal rate, regular rhythm, S1 normal, S2 normal, normal heart sounds and intact distal pulses.  Exam reveals no gallop, no S3, no S4 and no friction rub.   No murmur heard. Pulmonary/Chest: Effort normal and breath sounds normal. She has no decreased breath sounds. She has no wheezes. She has no rales. Right breast exhibits no inverted nipple, no mass, no nipple discharge, no skin  change and no tenderness. Left breast exhibits no inverted nipple, no mass, no nipple discharge, no skin change and no tenderness. Breasts are symmetrical.  Abdominal: Soft. Bowel sounds are normal. She exhibits no mass. There is no hepatosplenomegaly. There is no tenderness. There is no rebound, no guarding and no CVA tenderness.  Musculoskeletal: Normal range of motion. She exhibits no edema.       Left shoulder: She exhibits tenderness.  Limited abduction/ tenderness  Lymphadenopathy:       Head (right side): No submental and no submandibular adenopathy present.       Head (left side): No submental and no submandibular adenopathy present.    She has no cervical adenopathy.    She has no axillary adenopathy.  Neurological: She is alert and oriented to person, place, and time. She has normal motor skills, normal sensation, normal strength, normal reflexes and intact cranial nerves.  Skin: Skin is warm, dry and intact. No rash noted. She is not diaphoretic.  Psychiatric: Affect normal. She  exhibits a depressed mood.  Nursing note and vitals reviewed.     Assessment & Plan  Problem List Items Addressed This Visit      Other   Dysmetabolic syndrome   Relevant Orders   Renal Function Panel   Lipid Profile   Hemoglobin A1c    Other Visit Diagnoses    Annual physical exam    -  Primary   Impingement syndrome of left ankle       Relevant Orders   Ambulatory referral to Orthopedic Surgery   Major depressive disorder with single episode, remission status unspecified       Relevant Medications   sertraline (ZOLOFT) 25 MG tablet   Breast cancer screening       Relevant Orders   MM Digital Screening        Dr. [facility]  03/18/16

## 2016-03-19 LAB — RENAL FUNCTION PANEL
ALBUMIN: 4 g/dL (ref 3.5–4.8)
BUN/Creatinine Ratio: 17 (ref 12–28)
BUN: 17 mg/dL (ref 8–27)
CHLORIDE: 99 mmol/L (ref 96–106)
CO2: 29 mmol/L (ref 18–29)
Calcium: 9.4 mg/dL (ref 8.7–10.3)
Creatinine, Ser: 0.99 mg/dL (ref 0.57–1.00)
GFR, EST AFRICAN AMERICAN: 66 mL/min/{1.73_m2} (ref >59)
GFR, EST NON AFRICAN AMERICAN: 57 mL/min/{1.73_m2} — AB (ref >59)
GLUCOSE: 170 mg/dL — AB (ref 65–99)
PHOSPHORUS: 3.1 mg/dL (ref 2.5–4.5)
POTASSIUM: 4.1 mmol/L (ref 3.5–5.2)
Sodium: 144 mmol/L (ref 134–144)

## 2016-03-19 LAB — LIPID PANEL
Chol/HDL Ratio: 3.9 ratio units (ref 0.0–4.4)
Cholesterol, Total: 165 mg/dL (ref 100–199)
HDL: 42 mg/dL (ref >39)
LDL Calculated: 89 mg/dL (ref 0–99)
TRIGLYCERIDES: 171 mg/dL — AB (ref 0–149)
VLDL CHOLESTEROL CAL: 34 mg/dL (ref 5–40)

## 2016-03-19 LAB — HEMOGLOBIN A1C
Est. average glucose Bld gHb Est-mCnc: 151 mg/dL
Hgb A1c MFr Bld: 6.9 % — ABNORMAL HIGH (ref 4.8–5.6)

## 2016-03-23 ENCOUNTER — Telehealth: Payer: Self-pay

## 2016-03-23 NOTE — Telephone Encounter (Signed)
  Oncology Nurse Navigator Documentation Notified Ms. Holly Smith that Dr. Fransisca Connors will be in surgery during her appt time. Appt rescheduled for 1/3 at 2:15. Read back performed. Navigator Location: [facility] (03/23/16 0900)   )Navigator Encounter Type: Telephone (03/23/16 0900) Telephone: [name] Call;Appt Confirmation/Clarification (03/23/16 0900)                                                  Time Spent with Patient: 15 (03/23/16 0900)

## 2016-03-24 ENCOUNTER — Ambulatory Visit: Payer: Self-pay

## 2016-03-31 ENCOUNTER — Other Ambulatory Visit: Payer: Self-pay | Admitting: Internal Medicine

## 2016-04-01 ENCOUNTER — Ambulatory Visit
Admission: RE | Admit: 2016-04-01 | Discharge: 2016-04-01 | Disposition: A | Discharge status: Home / Self Care | Payer: PPO | Source: Ambulatory Visit | Attending: Family Medicine | Admitting: Family Medicine

## 2016-04-01 DIAGNOSIS — M7552 Bursitis of left shoulder: Secondary | ICD-10-CM | POA: Diagnosis not present

## 2016-04-01 DIAGNOSIS — Z1239 Encounter for other screening for malignant neoplasm of breast: Secondary | ICD-10-CM

## 2016-04-01 DIAGNOSIS — Z1231 Encounter for screening mammogram for malignant neoplasm of breast: Secondary | ICD-10-CM | POA: Insufficient documentation

## 2016-04-01 NOTE — Telephone Encounter (Signed)
Rx refill sent to pharmacy. 

## 2016-04-14 ENCOUNTER — Inpatient Hospital Stay: Payer: PPO | Attending: Obstetrics and Gynecology | Admitting: Obstetrics and Gynecology

## 2016-04-14 VITALS — BP 145/90 | HR 68 | Temp 97.0°F | Wt 219.5 lb

## 2016-04-14 DIAGNOSIS — K5732 Diverticulitis of large intestine without perforation or abscess without bleeding: Secondary | ICD-10-CM | POA: Diagnosis not present

## 2016-04-14 DIAGNOSIS — J449 Chronic obstructive pulmonary disease, unspecified: Secondary | ICD-10-CM | POA: Diagnosis not present

## 2016-04-14 DIAGNOSIS — I5042 Chronic combined systolic (congestive) and diastolic (congestive) heart failure: Secondary | ICD-10-CM | POA: Diagnosis not present

## 2016-04-14 DIAGNOSIS — M1990 Unspecified osteoarthritis, unspecified site: Secondary | ICD-10-CM | POA: Insufficient documentation

## 2016-04-14 DIAGNOSIS — K219 Gastro-esophageal reflux disease without esophagitis: Secondary | ICD-10-CM | POA: Insufficient documentation

## 2016-04-14 DIAGNOSIS — Z87891 Personal history of nicotine dependence: Secondary | ICD-10-CM | POA: Diagnosis not present

## 2016-04-14 DIAGNOSIS — I110 Hypertensive heart disease with heart failure: Secondary | ICD-10-CM | POA: Insufficient documentation

## 2016-04-14 DIAGNOSIS — N393 Stress incontinence (female) (male): Secondary | ICD-10-CM | POA: Diagnosis not present

## 2016-04-14 DIAGNOSIS — A630 Anogenital (venereal) warts: Secondary | ICD-10-CM | POA: Diagnosis not present

## 2016-04-14 DIAGNOSIS — Z90710 Acquired absence of both cervix and uterus: Secondary | ICD-10-CM | POA: Diagnosis not present

## 2016-04-14 DIAGNOSIS — I340 Nonrheumatic mitral (valve) insufficiency: Secondary | ICD-10-CM | POA: Diagnosis not present

## 2016-04-14 DIAGNOSIS — Z79899 Other long term (current) drug therapy: Secondary | ICD-10-CM | POA: Diagnosis not present

## 2016-04-14 DIAGNOSIS — I482 Chronic atrial fibrillation: Secondary | ICD-10-CM | POA: Diagnosis not present

## 2016-04-14 DIAGNOSIS — Z90722 Acquired absence of ovaries, bilateral: Secondary | ICD-10-CM | POA: Insufficient documentation

## 2016-04-14 DIAGNOSIS — Z8544 Personal history of malignant neoplasm of other female genital organs: Secondary | ICD-10-CM | POA: Diagnosis not present

## 2016-04-14 DIAGNOSIS — E119 Type 2 diabetes mellitus without complications: Secondary | ICD-10-CM | POA: Insufficient documentation

## 2016-04-14 DIAGNOSIS — E785 Hyperlipidemia, unspecified: Secondary | ICD-10-CM | POA: Diagnosis not present

## 2016-04-14 DIAGNOSIS — Z7901 Long term (current) use of anticoagulants: Secondary | ICD-10-CM | POA: Insufficient documentation

## 2016-04-14 DIAGNOSIS — G4733 Obstructive sleep apnea (adult) (pediatric): Secondary | ICD-10-CM | POA: Diagnosis not present

## 2016-04-14 DIAGNOSIS — C519 Malignant neoplasm of vulva, unspecified: Secondary | ICD-10-CM

## 2016-04-14 NOTE — Progress Notes (Signed)
Patient here for follow up. No changes since last appointment.  

## 2016-04-14 NOTE — Progress Notes (Signed)
Gynecologic Oncology Interval Note  Referring Provider: Dr. Laverta [place]  Chief Concern: Vulvar cancer surveillance.   Subjective:  Holly Smith is a 71 y.o. woman who presents today for continued surveillance for history of invasive vulvar cancer.   No new lesions or other complaints today.   Still has urinary leakage for which she uses Vesicare.  PAP 3/16 normal.  No gyn complaints.   Oncology Treatment History:  Patient had warty lesion on the upper right labia for about two years.    Hysterectomy in the past for benign disease.  Then BSO in 2003 for benign ovarian tumor.  03/21/12 PAP + HR HPV vaginal bx 2 o'clock negative, 9 o'clock HPV effect  Vulvar itching and pain.  07/18/12 - vulvar bx showed condyloma  [id number] - vulvar bx - lichen sclerosis  [id number] - Exophytic mass on right anterior vulva. 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  Negative margins, but within 38mm of cancer.  No LVSI.    We discussed the rationale for re-excision of the scar to insure that there is no residual cancer present in view of the close 2 mm margin and sentinel lymph node mapping and biopsy because the tumor has > 70mm invasion.  Had partial right modified radical vulvectomy on 07/02/14 at [facility] for invasive verrucous carcinoma of the vulva. The final pathology revealed: negative lymph nodes, no evidence of residual disease.      Problem List: Patient Active Problem List   Diagnosis Date Noted  . Persistent atrial fibrillation ([place]) 08/13/2015  . Chronic obstructive pulmonary disease ([place]) 08/13/2015  . Personal history of other specified conditions 08/13/2015  . Obesity, diabetes, and hypertension syndrome ([place]) 08/13/2015  . Adiposity 08/13/2015  . Special screening for malignant neoplasms, colon   . Benign neoplasm of ascending colon   . Benign neoplasm of descending colon   . Benign neoplasm of sigmoid colon   .  Chronic diastolic heart failure ([place]) 05/05/2015  . Diverticulitis 01/31/2015  . Diverticulitis large intestine w/o perforation or abscess w/bleeding 01/28/2015  . Chronic combined systolic and diastolic CHF, NYHA class 3 ([place]) 01/28/2015  . Acute diverticulitis 01/28/2015  . Diverticulitis of large intestine without perforation or abscess with bleeding   . Acute on chronic diastolic heart failure ([place]) 01/21/2015  . Hypertensive urgency 01/21/2015  . Morbid obesity ([place]) 01/21/2015  . Blood in stool 01/21/2015  . Acute diastolic CHF (congestive heart failure) ([place]) 01/21/2015  . CAFL (chronic airflow limitation) ([place]) 12/16/2014  . Personal history of perinatal problems 12/16/2014  . BP (high blood pressure) 12/16/2014  . HLD (hyperlipidemia) 12/16/2014  . Dysmetabolic syndrome [id number]  . Calculus of kidney 12/16/2014  . Beat, premature ventricular 12/16/2014  . Snores 12/16/2014  . Stress incontinence 12/16/2014  . Fast heart beat 12/16/2014  . Calculus (=stone) 12/16/2014  . OSA (obstructive sleep apnea) 11/27/2014  . Dyspnea 10/07/2014  . Essential hypertension 10/07/2014  . Obesity 10/07/2014  . Genital warts 08/02/2014  . Condyloma acuminatum of vulva 08/02/2014  . Condylomata lata of vulva 08/02/2014  . History of anticoagulant therapy 08/02/2014  . Chronic atrial fibrillation ([place]) 08/02/2014  . Current tobacco use 08/02/2014  . 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 Malignant neoplasm of vulva ([place]) 06/19/2014  . Vulvar cancer ([place]) 05/10/2014  . Vulvar cancer, carcinoma ([place]) 05/10/2014  . 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 Chest pain 08/20/2013  . Breath shortness 08/20/2013  .  CCF (congestive cardiac failure) ([place]) 08/03/2013  . Congestive heart failure ([place]) 08/03/2013  . History of biliary T-tube placement 06/21/2013  . Abnormal echocardiogram 04/23/2013  . MI (mitral incompetence) 04/23/2013  . TI (tricuspid incompetence)  04/23/2013    Past Medical History: Past Medical History:  Diagnosis Date  . Abnormal Pap smear of vagina and vaginal HPV   . Arthritis    "bones ache" (01/23/2015)  . Cancer ([place])    vulvar cancer  . CHF (congestive heart failure) ([place])   . Chronic anticoagulation    She is on Xarelto for Afib  . Chronic atrial fibrillation ([place])   . COPD (chronic obstructive pulmonary disease) ([place])    "pulmonary drs said I don't have COPD fall 2016" (01/23/2015)  . Echocardiogram abnormal    Mitral regurgitation, tricuspic regurgitation  . GERD (gastroesophageal reflux disease)   . History of stress test    Myoview 8/16: EF 42%, anteroseptal, inferoseptal, anterior, apical anterior and apical defect (likely represents breast attenuation versus scar), no ischemia; intermediate risk because of low EF  . HPV (human papilloma virus) anogenital infection    03/21/12 PAP + HR HPV  . Hypertension   . Insulin resistance   . Kidney stones   . Obesity   . OSA on CPAP   . Postoperative nausea and vomiting   . Urolithiasis   . Vulvar cancer ([place]) 2016   invasive verrucous carcinoma of the vulva. History of genital warts and vulvar condyloma    Past Surgical History: Past Surgical History:  Procedure Laterality Date  . ABDOMINAL HYSTERECTOMY    . APPENDECTOMY    . BILATERAL SALPINGOOPHORECTOMY  2003   benign ovarian cancer  . CARDIOVERSION  2014-2015   "[place]"  . COLONOSCOPY WITH PROPOFOL N/A 06/10/2015   Procedure: COLONOSCOPY WITH PROPOFOL;  Surgeon: Lucilla Lame, MD;  Location: [facility] ENDOSCOPY;  Service: Endoscopy;  Laterality: N/A;  . LITHOTRIPSY    . TONSILLECTOMY    . VULVECTOMY Right 05/10/2014   Excisional biopsy of the superior right labial majora mass; [place] PARTIAL  07/02/2014   Re-excision and sentinel node dissection at [facility].     Family History: Family History  Problem Relation Age of Onset  . Prostate cancer Brother 36    still living and  well  . Heart attack Father   . Stroke Mother   . Diabetes Mother   . Hypertension Mother   . Heart attack Brother   . Pancreatic cancer Brother   . Healthy Sister   . Breast cancer Neg Hx     Social History: Social History   Social History  . Marital status: Divorced    Spouse name: N/A  . Number of children: N/A  . Years of education: N/A   Occupational History  . Not on file.   Social History Main Topics  . Smoking status: Former Smoker    Packs/day: 1.00    Years: 50.00    Types: Cigarettes, E-cigarettes    Quit date: 05/20/2012  . Smokeless tobacco: Never Used  . Alcohol use 0.0 oz/week     Comment: 01/23/2015 "I' used to drink socially a few times/month"  . Drug use: No  . Sexual activity: Not Currently   Other Topics Concern  . Not on file   Social History Narrative  . No narrative on file    Allergies: Allergies  Allergen Reactions  . Ciprofloxacin Other (See Comments)    SOB    Current Medications:  Current Outpatient Prescriptions  Medication Sig Dispense Refill  . albuterol (PROVENTIL HFA;VENTOLIN HFA) 108 (90 BASE) MCG/ACT inhaler Inhale 2 puffs into the lungs every 8 (eight) hours as needed for wheezing or shortness of breath. 1 Inhaler 6  . furosemide (LASIX) 40 MG tablet TAKE 2 TABLETS (80 MG TOTAL) BY MOUTH DAILY. 60 tablet 11  . irbesartan (AVAPRO) 75 MG tablet Take 1 tablet (75 mg total) by mouth daily. 30 tablet 6  . metoprolol (LOPRESSOR) 100 MG tablet TAKE 1 AND 1/2 TABLETS (150 MG TOTAL) BY MOUTH 2 (TWO) TIMES DAILY. 90 tablet 1  . metoprolol succinate (TOPROL-XL) 100 MG 24 hr tablet Take 1 tablet (100 mg total) by mouth 2 (two) times daily. Take with or immediately following a meal. 60 tablet 3  . omeprazole (PRILOSEC) 20 MG capsule TAKE 1 CAPSULE (20 MG TOTAL) BY MOUTH ONCE DAILY. 90 capsule 3  . sertraline (ZOLOFT) 25 MG tablet Take 1 tablet (25 mg total) by mouth daily. 30 tablet 1  . XARELTO 20 MG TABS tablet TAKE 1 TABLET (20 MG  TOTAL) BY MOUTH DAILY WITH SUPPER. (COPAY IS $215.00 WITH INSURANCE) 30 tablet 10   No current facility-administered medications for this visit.       Review of Systems Pertinent items are noted in HPI.  Objective:  Physical Examination:  BP (!) 145/90 (BP Location: Right Wrist, Patient Position: Sitting)   Pulse 68   Temp 97 F (36.1 C) (Tympanic)   Wt 219 lb 7.5 oz (99.6 kg)   BMI 42.86 kg/m   ECOG Performance Status: 0 - Asymptomatic  General appearance: alert, cooperative and appears stated age HEENT:PERRLA, neck supple with midline trachea and thyroid without masses Lymph node survey: non-palpable, axillary, inguinal, supraclavicular Cardiovascular: regular rate and rhythm, no murmurs or gallops Respiratory: normal air entry, lungs clear to auscultation Breast exam: not examined. Abdomen: soft, non-tender, without masses or organomegaly, no hernias and well healed incision Back: inspection of back is normal Extremities: extremities normal, atraumatic, no cyanosis or edema Skin exam - normal coloration and turgor, no rashes, no suspicious skin lesions noted. Neurological exam reveals alert, oriented, normal speech, no focal findings or movement disorder noted.  Pelvic: exam chaperoned by nurse;  Vulva: normal appearing vulva with no masses, tenderness or lesions s/p partial vulvectomy; Urethral caruncle; Vagina: normal vagina; Adnexa: normal adnexa in size, nontender and no masses; Rectal: normal rectal, no masses.  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  No evidence of disease.   Plan:   Problem List Items Addressed This Visit      Genitourinary   Vulvar cancer, carcinoma ([place]) - Primary     Reassurance given.  Suggested return to clinic in  4 months.  She is comfortable with the plan and had her questions answered. I offered  referral to [facility] for management of incontinence in the past, but she declined.  Just changed insurance and may seek referral after it is clarified where she can go for care.   Mellody Drown, MD  CC:  Juline Patch, MD [address] [place], [place] [address] [phone]

## 2016-04-14 NOTE — Progress Notes (Signed)
  Oncology Nurse Navigator Documentation Chaperoned pelvic exam. Follow up in 4 months Navigator Location: [facility] (04/14/16 1000)   )Navigator Encounter Type: Follow-up Appt (04/14/16 1000)                     Patient Visit Type: GynOnc (04/14/16 1000)                              Time Spent with Patient: 15 (04/14/16 1000)

## 2016-04-15 ENCOUNTER — Encounter: Payer: Self-pay | Admitting: Family Medicine

## 2016-04-15 ENCOUNTER — Ambulatory Visit (INDEPENDENT_AMBULATORY_CARE_PROVIDER_SITE_OTHER): Payer: PPO | Admitting: Family Medicine

## 2016-04-15 VITALS — BP 120/80 | HR 78 | Ht 60.0 in | Wt 219.0 lb

## 2016-04-15 DIAGNOSIS — F329 Major depressive disorder, single episode, unspecified: Secondary | ICD-10-CM

## 2016-04-15 DIAGNOSIS — F32A Depression, unspecified: Secondary | ICD-10-CM

## 2016-04-15 DIAGNOSIS — F418 Other specified anxiety disorders: Secondary | ICD-10-CM

## 2016-04-15 DIAGNOSIS — F419 Anxiety disorder, unspecified: Principal | ICD-10-CM

## 2016-04-15 MED ORDER — SERTRALINE HCL 25 MG PO TABS: 25.0000 mg | ORAL_TABLET | Freq: Every day | ORAL | 1 refills | Status: DC

## 2016-04-15 NOTE — Progress Notes (Signed)
Name: Holly Smith   MRN: [id number]    DOB: 07-Jun-1945   Date:04/15/2016       Progress Note  Subjective  Chief Complaint  Chief Complaint  Patient presents with  . Depression    feeling better- feels like it wears off around 9:00 at night/ gets "jittery"    Depression       The patient presents with depression.  This is a new problem.  The onset quality is gradual.   The problem has been gradually improving since onset.  Associated symptoms include no decreased concentration, no fatigue, no helplessness, no hopelessness, does not have insomnia, not irritable, no restlessness, no appetite change, no body aches, no myalgias, no headaches, no indigestion, not sad and no suicidal ideas.     The symptoms are aggravated by nothing.  Past treatments include SSRIs - Selective serotonin reuptake inhibitors.  Compliance with treatment is good.  Previous treatment provided significant relief.  Past medical history includes anxiety and depression.     Pertinent negatives include no obsessive-compulsive disorder, no post-traumatic stress disorder and no suicide attempts.   No problem-specific Assessment & Plan notes found for this encounter.   Past Medical History:  Diagnosis Date  . Abnormal Pap smear of vagina and vaginal HPV   . Arthritis    "bones ache" (01/23/2015)  . Cancer ([place])    vulvar cancer  . CHF (congestive heart failure) ([place])   . Chronic anticoagulation    She is on Xarelto for Afib  . Chronic atrial fibrillation ([place])   . COPD (chronic obstructive pulmonary disease) ([place])    "pulmonary drs said I don't have COPD fall 2016" (01/23/2015)  . Echocardiogram abnormal    Mitral regurgitation, tricuspic regurgitation  . GERD (gastroesophageal reflux disease)   . History of stress test    Myoview 8/16: EF 42%, anteroseptal, inferoseptal, anterior, apical anterior and apical defect (likely represents breast attenuation versus scar), no ischemia; intermediate risk because of low  EF  . HPV (human papilloma virus) anogenital infection    03/21/12 PAP + HR HPV  . Hypertension   . Insulin resistance   . Kidney stones   . Obesity   . OSA on CPAP   . Postoperative nausea and vomiting   . Urolithiasis   . Vulvar cancer ([place]) 2016   invasive verrucous carcinoma of the vulva. History of genital warts and vulvar condyloma    Past Surgical History:  Procedure Laterality Date  . ABDOMINAL HYSTERECTOMY    . APPENDECTOMY    . BILATERAL SALPINGOOPHORECTOMY  2003   benign ovarian cancer  . CARDIOVERSION  2014-2015   "[place]"  . COLONOSCOPY WITH PROPOFOL N/A 06/10/2015   Procedure: COLONOSCOPY WITH PROPOFOL;  Surgeon: Lucilla Lame, MD;  Location: [facility] ENDOSCOPY;  Service: Endoscopy;  Laterality: N/A;  . LITHOTRIPSY    . TONSILLECTOMY    . VULVECTOMY Right 05/10/2014   Excisional biopsy of the superior right labial majora mass; [place] PARTIAL  07/02/2014   Re-excision and sentinel node dissection at [facility].     Family History  Problem Relation Age of Onset  . Prostate cancer Brother 58    still living and well  . Heart attack Father   . Stroke Mother   . Diabetes Mother   . Hypertension Mother   . Heart attack Brother   . Pancreatic cancer Brother   . Healthy Sister   . Breast cancer Neg Hx     Social History  Social History  . Marital status: Divorced    Spouse name: N/A  . Number of children: N/A  . Years of education: N/A   Occupational History  . Not on file.   Social History Main Topics  . Smoking status: Former Smoker    Packs/day: 1.00    Years: 50.00    Types: Cigarettes, E-cigarettes    Quit date: 05/20/2012  . Smokeless tobacco: Never Used  . Alcohol use 0.0 oz/week     Comment: 01/23/2015 "I' used to drink socially a few times/month"  . Drug use: No  . Sexual activity: Not Currently   Other Topics Concern  . Not on file   Social History Narrative  . No narrative on file    Allergies  Allergen  Reactions  . Ciprofloxacin Other (See Comments)    SOB     Review of Systems  Constitutional: Negative for appetite change, chills, fatigue, fever, malaise/fatigue and weight loss.  HENT: Negative for ear discharge, ear pain and sore throat.   Eyes: Negative for blurred vision.  Respiratory: Negative for cough, sputum production, shortness of breath and wheezing.   Cardiovascular: Negative for chest pain, palpitations and leg swelling.  Gastrointestinal: Negative for abdominal pain, blood in stool, constipation, diarrhea, heartburn, melena and nausea.  Genitourinary: Negative for dysuria, frequency, hematuria and urgency.  Musculoskeletal: Negative for back pain, joint pain, myalgias and neck pain.  Skin: Negative for rash.  Neurological: Negative for dizziness, tingling, sensory change, focal weakness and headaches.  Endo/Heme/Allergies: Negative for environmental allergies and polydipsia. Does not bruise/bleed easily.  Psychiatric/Behavioral: Positive for depression. Negative for decreased concentration and suicidal ideas. The patient is not nervous/anxious and does not have insomnia.      Objective  Vitals:   04/15/16 1009  BP: 120/80  Pulse: 78  Weight: 219 lb (99.3 kg)  Height: 5' (1.524 m)    Physical Exam  Constitutional: She is well-developed, well-nourished, and in no distress. She is not irritable. No distress.  HENT:  Head: Normocephalic and atraumatic.  Right Ear: External ear normal.  Left Ear: External ear normal.  Nose: Nose normal.  Mouth/Throat: Oropharynx is clear and moist.  Eyes: Conjunctivae and EOM are normal. Pupils are equal, round, and reactive to light. Right eye exhibits no discharge. Left eye exhibits no discharge.  Neck: Normal range of motion. Neck supple. No JVD present. No thyromegaly present.  Cardiovascular: Normal rate, regular rhythm, normal heart sounds and intact distal pulses.  Exam reveals no gallop and no friction rub.   No murmur  heard. Pulmonary/Chest: Effort normal and breath sounds normal. She has no wheezes. She has no rales.  Abdominal: Soft. Bowel sounds are normal. She exhibits no mass. There is no tenderness. There is no guarding.  Musculoskeletal: Normal range of motion. She exhibits no edema.  Lymphadenopathy:    She has no cervical adenopathy.  Neurological: She is alert.  Skin: Skin is warm and dry. She is not diaphoretic.  Psychiatric: Mood and affect normal.  Nursing note and vitals reviewed.     Assessment & Plan  Problem List Items Addressed This Visit    None    Visit Diagnoses    Anxiety and depression    -  Primary   Relevant Medications   sertraline (ZOLOFT) 25 MG tablet   Major depressive disorder with single episode, remission status unspecified       Relevant Medications   sertraline (ZOLOFT) 25 MG tablet  Dr. Macon [facility]  04/15/16

## 2016-04-19 NOTE — Progress Notes (Deleted)
Patient ID: Holly Smith, female   DOB: 1945/07/24, 71 y.o.   MRN: [id number]     Cardiology Office Note   Date:  04/19/2016   ID:  Holly, Smith [birth year], MRN [id number]  PCP:  Otilio Miu, MD    No chief complaint on file. f/u [facility]   Wt Readings from Last 3 Encounters:  04/15/16 219 lb (99.3 kg)  04/14/16 219 lb 7.5 oz (99.6 kg)  03/18/16 226 lb (102.5 kg)       History of Present Illness: Holly Smith is a 71 y.o. female  Who had PAF and a tachycardia mediated cardiomyopathy in 2015.  She was cardioverted at [facility].    She saw me for the first time in 2016.  She feels better taking her diuretics.  She had lost 20+ lbs, but gained some of it back. She has to stay under her 2000 mg of sodium requirement and she will feel ok.    Dr. Rayann Heman had talked to her about Tikosyn.  She instead took more metoprolol, although not as much as he prescribed.  She is only taking metoprolol 100 mg BID.    She developed diverticulitis and needs a f/u colonoscopy.  Overall, her breathing is better.  Still was some DOE.  She has occsaional edema. Better with leg elevation.   No palpitations. Does feel palpitations if she misses the metoprolol.  No orthopnea.  SHe uses 2 pillows.  She uses CPAP.  Occasional left arm pain that radiates to her right arm.  Worse with twisting or pulling the arm.      Past Medical History:  Diagnosis Date  . Abnormal Pap smear of vagina and vaginal HPV   . Arthritis    "bones ache" (01/23/2015)  . Cancer ([place])    vulvar cancer  . CHF (congestive heart failure) ([place])   . Chronic anticoagulation    She is on Xarelto for Afib  . Chronic atrial fibrillation ([place])   . COPD (chronic obstructive pulmonary disease) ([place])    "pulmonary drs said I don't have COPD fall 2016" (01/23/2015)  . Echocardiogram abnormal    Mitral regurgitation, tricuspic regurgitation  . GERD (gastroesophageal reflux disease)   . History of stress test    Myoview  8/16: EF 42%, anteroseptal, inferoseptal, anterior, apical anterior and apical defect (likely represents breast attenuation versus scar), no ischemia; intermediate risk because of low EF  . HPV (human papilloma virus) anogenital infection    03/21/12 PAP + HR HPV  . Hypertension   . Insulin resistance   . Kidney stones   . Obesity   . OSA on CPAP   . Postoperative nausea and vomiting   . Urolithiasis   . Vulvar cancer ([place]) 2016   invasive verrucous carcinoma of the vulva. History of genital warts and vulvar condyloma    Past Surgical History:  Procedure Laterality Date  . ABDOMINAL HYSTERECTOMY    . APPENDECTOMY    . BILATERAL SALPINGOOPHORECTOMY  2003   benign ovarian cancer  . CARDIOVERSION  2014-2015   "[place]"  . COLONOSCOPY WITH PROPOFOL N/A 06/10/2015   Procedure: COLONOSCOPY WITH PROPOFOL;  Surgeon: Lucilla Lame, MD;  Location: [facility] ENDOSCOPY;  Service: Endoscopy;  Laterality: N/A;  . LITHOTRIPSY    . TONSILLECTOMY    . VULVECTOMY Right 05/10/2014   Excisional biopsy of the superior right labial majora mass; [place] PARTIAL  07/02/2014   Re-excision and sentinel node dissection at [facility].  Current Outpatient Prescriptions  Medication Sig Dispense Refill  . albuterol (PROVENTIL HFA;VENTOLIN HFA) 108 (90 BASE) MCG/ACT inhaler Inhale 2 puffs into the lungs every 8 (eight) hours as needed for wheezing or shortness of breath. 1 Inhaler 6  . furosemide (LASIX) 40 MG tablet TAKE 2 TABLETS (80 MG TOTAL) BY MOUTH DAILY. 60 tablet 11  . irbesartan (AVAPRO) 75 MG tablet Take 1 tablet (75 mg total) by mouth daily. 30 tablet 6  . metoprolol (LOPRESSOR) 100 MG tablet TAKE 1 AND 1/2 TABLETS (150 MG TOTAL) BY MOUTH 2 (TWO) TIMES DAILY. (Patient taking differently: TAKE 1 AND 1/2 TABLETS (150 MG TOTAL) BY MOUTH 2 (TWO) TIMES DAILY. cardio in [place]) 90 tablet 1  . metoprolol succinate (TOPROL-XL) 100 MG 24 hr tablet Take 1 tablet (100 mg total) by  mouth 2 (two) times daily. Take with or immediately following a meal. (Patient taking differently: Take 100 mg by mouth 2 (two) times daily. Take with or immediately following a meal.- cardio in [place]) 60 tablet 3  . omeprazole (PRILOSEC) 20 MG capsule TAKE 1 CAPSULE (20 MG TOTAL) BY MOUTH ONCE DAILY. 90 capsule 3  . sertraline (ZOLOFT) 25 MG tablet Take 1 tablet (25 mg total) by mouth daily. 90 tablet 1  . XARELTO 20 MG TABS tablet TAKE 1 TABLET (20 MG TOTAL) BY MOUTH DAILY WITH SUPPER. (COPAY IS $215.00 WITH INSURANCE) 30 tablet 10   No current facility-administered medications for this visit.     Allergies:   Ciprofloxacin    Social History:  The patient  reports that she quit smoking about 3 years ago. Her smoking use included Cigarettes and E-cigarettes. She has a 50.00 pack-year smoking history. She has never used smokeless tobacco. She reports that she drinks alcohol. She reports that she does not use drugs.   Family History:  The patient's family history includes Diabetes in her mother; Healthy in her sister; Heart attack in her brother and father; Hypertension in her mother; Pancreatic cancer in her brother; Prostate cancer (age of onset: 30) in her brother; Stroke in her mother.    ROS:  Please see the history of present illness.   Otherwise, review of systems are positive for DOE-improving.   All other systems are reviewed and negative.    PHYSICAL EXAM: VS:  There were no vitals taken for this visit. , BMI There is no height or weight on file to calculate BMI. GEN: Well nourished, well developed, in no acute distress  HEENT: normal  Neck: no JVD, carotid bruits, or masses Cardiac: RRR; no murmurs, rubs, or gallops,no edema  Respiratory:  clear to auscultation bilaterally, normal work of breathing GI: soft, nontender, nondistended, + BS, obese MS: no deformity or atrophy  Skin: warm and dry, no rash Neuro:  Strength and sensation are intact Psych: euthymic mood, full  affect    Recent Labs: 03/18/2016: BUN 17; Creatinine, Ser 0.99; Potassium 4.1; Sodium 144   Lipid Panel    Component Value Date/Time   CHOL 165 03/18/2016 1056   TRIG 171 (H) 03/18/2016 1056   HDL 42 03/18/2016 1056   CHOLHDL 3.9 03/18/2016 1056   CHOLHDL 4.4 01/22/2015 0455   VLDL 21 01/22/2015 0455   LDLCALC 89 03/18/2016 1056     Other studies Reviewed: Additional studies/ records that were reviewed today with results demonstrating: I reviewed the EP note..   ASSESSMENT AND PLAN:  1. AFib: rate controlled.  HR 87 by ECG. Stroke prevention with Xarelto. Increased metoprolol to  150 BID per Dr. Jackalyn [place] instructions. Continues Xarelto for stroke prevention. Her symptoms are well controlled. I don't think she needs to try Tikosyn at this point.  Rate controlled.  2. Chronic diastolic heart failure: Better controlled with weight loss and higher dose of diuretic. Now taking 80 mg of Lasix daily.  Try to get back to losing weight.  3. Hypertension: Continue current blood pressure medicines.  Blood pressure well controlled. 4. Obesity: I encouraged her to continue to try to lose weight as she is done in the last few months. 5. Arm pain: I don't think this is cardiac related. She had a low risk stress test in the past.   Current medicines are reviewed at length with the patient today.  The patient concerns regarding her medicines were addressed.  The following changes have been made:  As above  Labs/ tests ordered today include:  No orders of the defined types were placed in this encounter.   Recommend 150 minutes/week of aerobic exercise Low fat, low carb, high fiber diet recommended  Disposition:   FU in 6 months   Signed, Larae Grooms, MD  04/19/2016 2:55 PM    [address], [place],   [address] Phone: [phone]; Fax: [phone]

## 2016-04-20 ENCOUNTER — Ambulatory Visit: Payer: Self-pay | Admitting: Interventional Cardiology

## 2016-04-23 ENCOUNTER — Other Ambulatory Visit: Payer: Self-pay

## 2016-04-23 DIAGNOSIS — R0981 Nasal congestion: Secondary | ICD-10-CM

## 2016-04-23 DIAGNOSIS — R059 Cough, unspecified: Secondary | ICD-10-CM

## 2016-04-23 DIAGNOSIS — R05 Cough: Secondary | ICD-10-CM

## 2016-04-23 MED ORDER — AZITHROMYCIN 250 MG PO TABS: ORAL_TABLET | ORAL | 0 refills | Status: DC

## 2016-04-29 ENCOUNTER — Ambulatory Visit (INDEPENDENT_AMBULATORY_CARE_PROVIDER_SITE_OTHER): Payer: Self-pay | Admitting: Family Medicine

## 2016-04-29 ENCOUNTER — Encounter: Payer: Self-pay | Admitting: Family Medicine

## 2016-04-29 VITALS — BP 120/68 | HR 70 | Temp 97.5°F | Ht 60.0 in | Wt 216.0 lb

## 2016-04-29 DIAGNOSIS — J440 Chronic obstructive pulmonary disease with acute lower respiratory infection: Secondary | ICD-10-CM

## 2016-04-29 DIAGNOSIS — J0100 Acute maxillary sinusitis, unspecified: Secondary | ICD-10-CM

## 2016-04-29 DIAGNOSIS — M940 Chondrocostal junction syndrome [Tietze]: Secondary | ICD-10-CM

## 2016-04-29 DIAGNOSIS — J209 Acute bronchitis, unspecified: Secondary | ICD-10-CM

## 2016-04-29 MED ORDER — ALBUTEROL SULFATE (2.5 MG/3ML) 0.083% IN NEBU: 2.5000 mg | INHALATION_SOLUTION | Freq: Once | RESPIRATORY_TRACT | Status: DC

## 2016-04-29 MED ORDER — DOXYCYCLINE HYCLATE 100 MG PO TABS: 100.0000 mg | ORAL_TABLET | Freq: Two times a day (BID) | ORAL | 0 refills | Status: DC

## 2016-04-29 MED ORDER — GUAIFENESIN-CODEINE 100-10 MG/5ML PO SYRP: 5.0000 mL | ORAL_SOLUTION | Freq: Three times a day (TID) | ORAL | 0 refills | Status: DC | PRN

## 2016-04-29 MED ORDER — ALBUTEROL SULFATE HFA 108 (90 BASE) MCG/ACT IN AERS: 2.0000 | INHALATION_SPRAY | Freq: Three times a day (TID) | RESPIRATORY_TRACT | 6 refills | Status: DC | PRN

## 2016-04-29 NOTE — Patient Instructions (Signed)
Metered Dose Inhaler With Spacer Inhaled medicines are the basis of treatment of asthma and other breathing problems. Inhaled medicine can only be effective if used properly. Good technique assures that the medicine reaches the lungs. Your health care provider has asked you to use a spacer with your inhaler to help you take the medicine more effectively. A spacer is a plastic tube with a mouthpiece on one end and an opening that connects to the inhaler on the other end. Metered dose inhalers (MDIs) are used to deliver a variety of inhaled medicines. These include quick relief or rescue medicines (such as bronchodilators) and controller medicines (such as corticosteroids). The medicine is delivered by pushing down on a metal canister to release a set amount of spray. If you are using different kinds of inhalers, use your quick relief medicine to open the airways 10-15 minutes before using a steroid if instructed to do so by your health care provider. If you are unsure which inhalers to use and the order of using them, ask your health care provider, nurse, or respiratory therapist. HOW TO USE THE INHALER WITH A SPACER 1. Remove cap from inhaler. 2. If you are using the inhaler for the first time, you will need to prime it. Shake the inhaler for 5 seconds and release four puffs into the air, away from your face. Ask your health care provider or pharmacist if you have questions about priming your inhaler. 3. Shake inhaler for 5 seconds before each breath in (inhalation). 4. Place the open end of the spacer onto the mouthpiece of the inhaler. 5. Position the inhaler so that the top of the canister faces up and the spacer mouthpiece faces you. 6. Put your index finger on the top of the medicine canister. Your thumb supports the bottom of the inhaler and the spacer. 7. Breathe out (exhale) normally and as completely as possible. 8. Immediately after exhaling, place the spacer between your teeth and into your  mouth. Close your mouth tightly around the spacer. 9. Press the canister down with the index finger to release the medicine. 10. At the same time as the canister is pressed, inhale deeply and slowly until the lungs are completely filled. This should take 4-6 seconds. Keep your tongue down and out of the way. 11. Hold the medicine in your lungs for 5-10 seconds (10 seconds is best). This helps the medicine get into the small airways of your lungs. Exhale. 12. Repeat inhaling deeply through the spacer mouthpiece. Again hold that breath for up to 10 seconds (10 seconds is best). Exhale slowly. If it is difficult to take this second deep breath through the spacer, breathe normally several times through the spacer. Remove the spacer from your mouth. 13. Wait at least 15-30 seconds between puffs. Continue with the above steps until you have taken the number of puffs your health care provider has ordered. Do not use the inhaler more than your health care provider directs you to. 14. Remove spacer from the inhaler and place cap on inhaler. 15. Follow the directions from your health care provider or the inhaler insert for cleaning the inhaler and spacer. If you are using a steroid inhaler, rinse your mouth with water after your last puff, gargle, and spit out the water. Do not swallow the water. AVOID:   Inhaling before or after starting the spray of medicine. It takes practice to coordinate your breathing with triggering the spray.  Inhaling through the nose (rather than the mouth) when  triggering the spray. HOW TO DETERMINE IF YOUR INHALER IS FULL OR NEARLY EMPTY You cannot know when an inhaler is empty by shaking it. A few inhalers are now being made with dose counters. Ask your health care provider for a prescription that has a dose counter if you feel you need that extra help. If your inhaler does not have a counter, ask your health care provider to help you determine the date you need to refill your  inhaler. Write the refill date on a calendar or your inhaler canister. Refill your inhaler 7-10 days before it runs out. Be sure to keep an adequate supply of medicine. This includes making sure it is not expired, and you have a spare inhaler.  SEEK MEDICAL CARE IF:   Symptoms are only partially relieved with your inhaler.  You are having trouble using your inhaler.  You experience some increase in phlegm. SEEK IMMEDIATE MEDICAL CARE IF:   You feel little or no relief with your inhalers. You are still wheezing and are feeling shortness of breath or tightness in your chest or both.  You have dizziness, headaches, or fast heart rate.  You have chills, fever, or night sweats.  There is a noticeable increase in phlegm production, or there is blood in the phlegm. This information is not intended to replace advice given to you by your health care provider. Make sure you discuss any questions you have with your health care provider. Document Released: 03/08/2005 Document Revised: 07/23/2014 Document Reviewed: 08/24/2012 Elsevier Interactive Patient Education  2017 [employer].

## 2016-04-29 NOTE — Progress Notes (Signed)
Name: Holly Smith   MRN: [id number]    DOB: 05-13-1945   Date:04/29/2016       Progress Note  Subjective  Chief Complaint  Chief Complaint  Patient presents with  . Sinusitis    is on day 7 of ZPack- still has cough and cong and gets worse when lays down at night    Sinusitis  This is a new problem. The current episode started in the past 7 days. The problem has been waxing and waning since onset. There has been no fever. The pain is mild. Associated symptoms include chills, coughing and a sore throat. Pertinent negatives include no ear pain, headaches, neck pain or shortness of breath. Past treatments include antibiotics (azith). The treatment provided mild relief.  Cough  This is a new problem. The current episode started in the past 7 days. The problem has been waxing and waning. The cough is productive of purulent sputum (pale yellow). Associated symptoms include chest pain, chills, nasal congestion, postnasal drip, a sore throat and wheezing. Pertinent negatives include no ear pain, fever, headaches, heartburn, hemoptysis, myalgias, rash, shortness of breath or weight loss. There is no history of environmental allergies.    No problem-specific Assessment & Plan notes found for this encounter.   Past Medical History:  Diagnosis Date  . Abnormal Pap smear of vagina and vaginal HPV   . Arthritis    "bones ache" (01/23/2015)  . Cancer ([place])    vulvar cancer  . CHF (congestive heart failure) ([place])   . Chronic anticoagulation    She is on Xarelto for Afib  . Chronic atrial fibrillation ([place])   . COPD (chronic obstructive pulmonary disease) ([place])    "pulmonary drs said I don't have COPD fall 2016" (01/23/2015)  . Echocardiogram abnormal    Mitral regurgitation, tricuspic regurgitation  . GERD (gastroesophageal reflux disease)   . History of stress test    Myoview 8/16: EF 42%, anteroseptal, inferoseptal, anterior, apical anterior and apical defect (likely represents breast  attenuation versus scar), no ischemia; intermediate risk because of low EF  . HPV (human papilloma virus) anogenital infection    03/21/12 PAP + HR HPV  . Hypertension   . Insulin resistance   . Kidney stones   . Obesity   . OSA on CPAP   . Postoperative nausea and vomiting   . Urolithiasis   . Vulvar cancer ([place]) 2016   invasive verrucous carcinoma of the vulva. History of genital warts and vulvar condyloma    Past Surgical History:  Procedure Laterality Date  . ABDOMINAL HYSTERECTOMY    . APPENDECTOMY    . BILATERAL SALPINGOOPHORECTOMY  2003   benign ovarian cancer  . CARDIOVERSION  2014-2015   "[place]"  . COLONOSCOPY WITH PROPOFOL N/A 06/10/2015   Procedure: COLONOSCOPY WITH PROPOFOL;  Surgeon: Lucilla Lame, MD;  Location: [facility] ENDOSCOPY;  Service: Endoscopy;  Laterality: N/A;  . LITHOTRIPSY    . TONSILLECTOMY    . VULVECTOMY Right 05/10/2014   Excisional biopsy of the superior right labial majora mass; [place] PARTIAL  07/02/2014   Re-excision and sentinel node dissection at [facility].     Family History  Problem Relation Age of Onset  . Prostate cancer Brother 64    still living and well  . Heart attack Father   . Stroke Mother   . Diabetes Mother   . Hypertension Mother   . Heart attack Brother   . Pancreatic cancer Brother   .  Healthy Sister   . Breast cancer Neg Hx     Social History   Social History  . Marital status: Divorced    Spouse name: N/A  . Number of children: N/A  . Years of education: N/A   Occupational History  . Not on file.   Social History Main Topics  . Smoking status: Former Smoker    Packs/day: 1.00    Years: 50.00    Types: Cigarettes, E-cigarettes    Quit date: 05/20/2012  . Smokeless tobacco: Never Used  . Alcohol use 0.0 oz/week     Comment: 01/23/2015 "I' used to drink socially a few times/month"  . Drug use: No  . Sexual activity: Not Currently   Other Topics Concern  . Not on file    Social History Narrative  . No narrative on file    Allergies  Allergen Reactions  . Ciprofloxacin Other (See Comments)    SOB     Review of Systems  Constitutional: Positive for chills. Negative for fever, malaise/fatigue and weight loss.  HENT: Positive for postnasal drip and sore throat. Negative for ear discharge and ear pain.   Eyes: Negative for blurred vision.  Respiratory: Positive for cough and wheezing. Negative for hemoptysis, sputum production and shortness of breath.   Cardiovascular: Positive for chest pain. Negative for palpitations and leg swelling.  Gastrointestinal: Negative for abdominal pain, blood in stool, constipation, diarrhea, heartburn, melena and nausea.  Genitourinary: Negative for dysuria, frequency, hematuria and urgency.  Musculoskeletal: Negative for back pain, joint pain, myalgias and neck pain.  Skin: Negative for rash.  Neurological: Negative for dizziness, tingling, sensory change, focal weakness and headaches.  Endo/Heme/Allergies: Negative for environmental allergies and polydipsia. Does not bruise/bleed easily.  Psychiatric/Behavioral: Negative for depression and suicidal ideas. The patient is not nervous/anxious and does not have insomnia.      Objective  Vitals:   04/29/16 1411  BP: 120/68  Pulse: 70  Temp: 97.5 F (36.4 C)  TempSrc: Oral  SpO2: 96%  Weight: 216 lb (98 kg)  Height: 5' (1.524 m)    Physical Exam  Constitutional: She is well-developed, well-nourished, and in no distress. No distress.  HENT:  Head: Normocephalic and atraumatic.  Right Ear: External ear and ear canal normal. Tympanic membrane is retracted.  Left Ear: External ear and ear canal normal. Tympanic membrane is retracted.  Nose: Nose normal.  Mouth/Throat: Oropharynx is clear and moist.  Eyes: Conjunctivae and EOM are normal. Pupils are equal, round, and reactive to light. Right eye exhibits no discharge. Left eye exhibits no discharge.  Neck:  Normal range of motion. Neck supple. No JVD present. No thyromegaly present.  Cardiovascular: Normal rate, regular rhythm, normal heart sounds and intact distal pulses.  Exam reveals no gallop and no friction rub.   No murmur heard. Pulmonary/Chest: Effort normal. She has wheezes. She has no rales. She exhibits tenderness.  Abdominal: Soft. Bowel sounds are normal. She exhibits no mass. There is no tenderness. There is no guarding.  Musculoskeletal: Normal range of motion. She exhibits no edema.  Lymphadenopathy:    She has no cervical adenopathy.  Neurological: She is alert. She has normal reflexes.  Skin: Skin is warm and dry. She is not diaphoretic.  Psychiatric: Mood and affect normal.  Nursing note and vitals reviewed.     Assessment & Plan  Problem List Items Addressed This Visit    None    Visit Diagnoses    Acute bronchitis with COPD ([place])    -  Primary   Relevant Medications   doxycycline (VIBRA-TABS) 100 MG tablet   albuterol (PROVENTIL) (2.5 MG/3ML) 0.083% nebulizer solution 2.5 mg   albuterol (PROVENTIL HFA;VENTOLIN HFA) 108 (90 Base) MCG/ACT inhaler   guaiFENesin-codeine (ROBITUSSIN AC) 100-10 MG/5ML syrup   Acute maxillary sinusitis, recurrence not specified       Relevant Medications   doxycycline (VIBRA-TABS) 100 MG tablet   guaiFENesin-codeine (ROBITUSSIN AC) 100-10 MG/5ML syrup   Costochondritis, acute       aleve suggested   Relevant Medications   guaiFENesin-codeine (ROBITUSSIN AC) 100-10 MG/5ML syrup        Dr. Camdyn Beske [facility]  04/29/16

## 2016-05-12 ENCOUNTER — Encounter: Payer: Self-pay | Admitting: Interventional Cardiology

## 2016-05-12 ENCOUNTER — Ambulatory Visit (INDEPENDENT_AMBULATORY_CARE_PROVIDER_SITE_OTHER): Payer: Medicare Other | Admitting: Interventional Cardiology

## 2016-05-12 VITALS — BP 116/74 | HR 70 | Ht 60.0 in | Wt 219.8 lb

## 2016-05-12 DIAGNOSIS — I119 Hypertensive heart disease without heart failure: Secondary | ICD-10-CM | POA: Insufficient documentation

## 2016-05-12 DIAGNOSIS — I482 Chronic atrial fibrillation, unspecified: Secondary | ICD-10-CM

## 2016-05-12 DIAGNOSIS — I5032 Chronic diastolic (congestive) heart failure: Secondary | ICD-10-CM | POA: Diagnosis not present

## 2016-05-12 HISTORY — DX: Hypertensive heart disease without heart failure: I11.9

## 2016-05-12 NOTE — Patient Instructions (Signed)
Medication Instructions:  None  Labwork: None  Testing/Procedures: None  Follow-Up: Your physician wants you to follow-up in: 1 year with Dr. Varanasi.  You will receive a reminder letter in the mail two months in advance. If you don't receive a letter, please call our office to schedule the follow-up appointment.   Any Other Special Instructions Will Be Listed Below (If Applicable).     If you need a refill on your cardiac medications before your next appointment, please call your pharmacy.   

## 2016-05-12 NOTE — Progress Notes (Signed)
Patient ID: Holly Smith, female   DOB: [birth year], 70 y.o.   MRN: [id number]     Cardiology Office Note   Date:  05/12/2016   ID:  Holly, Smith 1946-03-16, MRN [id number]  PCP:  Otilio Miu, MD    Chief Complaint  Patient presents with  . Chronic Atrial Fibrillation  f/u [facility]   Wt Readings from Last 3 Encounters:  05/12/16 219 lb 12.8 oz (99.7 kg)  04/29/16 216 lb (98 kg)  04/15/16 219 lb (99.3 kg)       History of Present Illness: Holly Smith is a 71 y.o. female  Who had PAF and a tachycardia mediated cardiomyopathy in 2015.  She was cardioverted at [facility].    She saw me for the first time in 2016.  She feels better taking her diuretics.  She had lost 20+ lbs, but gained some of it back. She has to stay under her 2000 mg of sodium requirement and she will feel ok.    Dr. Rayann Heman had talked to her about Tikosyn.  She instead took more metoprolol, although not as much as he prescribed.  She is only taking metoprolol 100 mg BID.    She developed diverticulitis and had a f/u colonoscopy which was ok.  Since her last visit:  Overall, her breathing is better.  Still was some DOE.  Leg edema has resolved. She has derceased Lasix to once a day.  Rare palpitations- lasting only a few seconds. Does feel palpitations if she misses the metoprolol.  No orthopnea.  SHe uses 2 pillows.  She uses CPAP.  Arm pain better after cortisone shot.    She has a persistent cough, and has been treated for bronchitis.    Past Medical History:  Diagnosis Date  . Abnormal Pap smear of vagina and vaginal HPV   . Arthritis    "bones ache" (01/23/2015)  . Cancer ([place])    vulvar cancer  . CHF (congestive heart failure) ([place])   . Chronic anticoagulation    She is on Xarelto for Afib  . Chronic atrial fibrillation ([place])   . COPD (chronic obstructive pulmonary disease) ([place])    "pulmonary drs said I don't have COPD fall 2016" (01/23/2015)  . Echocardiogram abnormal    Mitral regurgitation, tricuspic regurgitation  . GERD (gastroesophageal reflux disease)   . History of stress test    Myoview 8/16: EF 42%, anteroseptal, inferoseptal, anterior, apical anterior and apical defect (likely represents breast attenuation versus scar), no ischemia; intermediate risk because of low EF  . HPV (human papilloma virus) anogenital infection    03/21/12 PAP + HR HPV  . Hypertension   . Insulin resistance   . Kidney stones   . Obesity   . OSA on CPAP   . Postoperative nausea and vomiting   . Urolithiasis   . Vulvar cancer ([place]) 2016   invasive verrucous carcinoma of the vulva. History of genital warts and vulvar condyloma    Past Surgical History:  Procedure Laterality Date  . ABDOMINAL HYSTERECTOMY    . APPENDECTOMY    . BILATERAL SALPINGOOPHORECTOMY  2003   benign ovarian cancer  . CARDIOVERSION  2014-2015   "[place]"  . COLONOSCOPY WITH PROPOFOL N/A 06/10/2015   Procedure: COLONOSCOPY WITH PROPOFOL;  Surgeon: Lucilla Lame, MD;  Location: [facility] ENDOSCOPY;  Service: Endoscopy;  Laterality: N/A;  . LITHOTRIPSY    . TONSILLECTOMY    . VULVECTOMY Right 05/10/2014   Excisional biopsy of the superior  right labial majora mass; [place] PARTIAL  07/02/2014   Re-excision and sentinel node dissection at [facility].      Current Outpatient Prescriptions  Medication Sig Dispense Refill  . albuterol (PROVENTIL HFA;VENTOLIN HFA) 108 (90 Base) MCG/ACT inhaler Inhale 2 puffs into the lungs every 8 (eight) hours as needed for wheezing or shortness of breath. 1 Inhaler 6  . doxycycline (VIBRA-TABS) 100 MG tablet Take 1 tablet (100 mg total) by mouth 2 (two) times daily. 20 tablet 0  . furosemide (LASIX) 40 MG tablet TAKE 2 TABLETS (80 MG TOTAL) BY MOUTH DAILY. 60 tablet 11  . guaiFENesin-codeine (ROBITUSSIN AC) 100-10 MG/5ML syrup Take 5 mLs by mouth 3 (three) times daily as needed for cough. 150 mL 0  . irbesartan (AVAPRO) 75 MG tablet Take 1  tablet (75 mg total) by mouth daily. 30 tablet 6  . metoprolol succinate (TOPROL-XL) 100 MG 24 hr tablet Take 100 mg by mouth 2 (two) times daily. Take with or immediately following a meal.    . metoprolol succinate (TOPROL-XL) 50 MG 24 hr tablet Take 50 mg by mouth 2 (two) times daily. Take with 100mg  tablet to equal 150 mg by mouth twice daily.   Take with or immediately following a meal.    . omeprazole (PRILOSEC) 20 MG capsule TAKE 1 CAPSULE (20 MG TOTAL) BY MOUTH ONCE DAILY. 90 capsule 3  . sertraline (ZOLOFT) 25 MG tablet Take 1 tablet (25 mg total) by mouth daily. 90 tablet 1  . XARELTO 20 MG TABS tablet TAKE 1 TABLET (20 MG TOTAL) BY MOUTH DAILY WITH SUPPER. (COPAY IS $215.00 WITH INSURANCE) 30 tablet 10   Current Facility-Administered Medications  Medication Dose Route Frequency Provider Last Rate Last Dose  . albuterol (PROVENTIL) (2.5 MG/3ML) 0.083% nebulizer solution 2.5 mg  2.5 mg Nebulization Once Juline Patch, MD        Allergies:   Ciprofloxacin    Social History:  The patient  reports that she quit smoking about 3 years ago. Her smoking use included Cigarettes and E-cigarettes. She has a 50.00 pack-year smoking history. She has never used smokeless tobacco. She reports that she drinks alcohol. She reports that she does not use drugs.   Family History:  The patient's family history includes Diabetes in her mother; Healthy in her sister; Heart attack in her brother and father; Hypertension in her mother; Pancreatic cancer in her brother; Prostate cancer (age of onset: 36) in her brother; Stroke in her mother.    ROS:  Please see the history of present illness.   Otherwise, review of systems are positive for DOE-improving.   All other systems are reviewed and negative.    PHYSICAL EXAM: VS:  BP 116/74   Pulse 70   Ht 5' (1.524 m)   Wt 219 lb 12.8 oz (99.7 kg)   SpO2 94%   BMI 42.93 kg/m  , BMI Body mass index is 42.93 kg/m. GEN: Well nourished, well developed, in no  acute distress  HEENT: normal  Neck: no JVD, carotid bruits, or masses Cardiac: RRR; no murmurs, rubs, or gallops,no edema  Respiratory:  clear to auscultation bilaterally, normal work of breathing GI: soft, nontender, nondistended, + BS, obese MS: no deformity or atrophy  Skin: warm and dry, no rash Neuro:  Strength and sensation are intact Psych: euthymic mood, full affect    Recent Labs: 03/18/2016: BUN 17; Creatinine, Ser 0.99; Potassium 4.1; Sodium 144   Lipid Panel  Component Value Date/Time   CHOL 165 03/18/2016 1056   TRIG 171 (H) 03/18/2016 1056   HDL 42 03/18/2016 1056   CHOLHDL 3.9 03/18/2016 1056   CHOLHDL 4.4 01/22/2015 0455   VLDL 21 01/22/2015 0455   LDLCALC 89 03/18/2016 1056     Other studies Reviewed: Additional studies/ records that were reviewed today with results demonstrating: .Marland Kitchen   ASSESSMENT AND PLAN:  1. AFib: rate controlled.  HR 84. Stroke prevention with Xarelto. Doing well on metoprolol to 150 BID per Dr. Jackalyn Lombard instructions. Continues Xarelto for stroke prevention. Her symptoms are well controlled. I don't think she needs to try Tikosyn at this point.  Rate controlled.  2. Chronic diastolic heart failure: Better controlled with weight loss and higher dose of diuretic. Now taking 80 mg of Lasix daily.  Try to get back to losing weight.  3. Hypertensive heart disease: Continue current blood pressure medicines.  Blood pressure well controlled. 4. Obesity: I encouraged her to continue to try to lose weight as she is done in the last few months.    Current medicines are reviewed at length with the patient today.  The patient concerns regarding her medicines were addressed.  The following changes have been made:  As above  Labs/ tests ordered today include:  No orders of the defined types were placed in this encounter.   Recommend 150 minutes/week of aerobic exercise Low fat, low carb, high fiber diet recommended  Disposition:   FU in 6  months   Signed, Larae Grooms, MD  05/12/2016 1:39 PM    [facility] [address], [place], [place]  [address] Phone: [phone]; Fax: [phone]

## 2016-05-25 ENCOUNTER — Other Ambulatory Visit: Payer: Self-pay | Admitting: Interventional Cardiology

## 2016-05-25 ENCOUNTER — Other Ambulatory Visit: Payer: Self-pay | Admitting: Internal Medicine

## 2016-05-25 MED ORDER — METOPROLOL SUCCINATE ER 50 MG PO TB24: ORAL_TABLET | ORAL | 11 refills | Status: DC

## 2016-05-25 MED ORDER — METOPROLOL SUCCINATE ER 100 MG PO TB24: 100.0000 mg | ORAL_TABLET | Freq: Two times a day (BID) | ORAL | 11 refills | Status: DC

## 2016-06-04 ENCOUNTER — Ambulatory Visit: Payer: Self-pay | Admitting: Interventional Cardiology

## 2016-07-06 ENCOUNTER — Other Ambulatory Visit: Payer: Self-pay

## 2016-07-07 ENCOUNTER — Ambulatory Visit (INDEPENDENT_AMBULATORY_CARE_PROVIDER_SITE_OTHER): Payer: Medicare Other | Admitting: Family Medicine

## 2016-07-07 VITALS — BP 120/80 | HR 64 | Ht 60.0 in | Wt 222.0 lb

## 2016-07-07 DIAGNOSIS — L570 Actinic keratosis: Secondary | ICD-10-CM | POA: Diagnosis not present

## 2016-07-07 DIAGNOSIS — L821 Other seborrheic keratosis: Secondary | ICD-10-CM | POA: Diagnosis not present

## 2016-07-07 DIAGNOSIS — J0140 Acute pansinusitis, unspecified: Secondary | ICD-10-CM

## 2016-07-07 DIAGNOSIS — J209 Acute bronchitis, unspecified: Secondary | ICD-10-CM | POA: Diagnosis not present

## 2016-07-07 DIAGNOSIS — J40 Bronchitis, not specified as acute or chronic: Secondary | ICD-10-CM

## 2016-07-07 DIAGNOSIS — L918 Other hypertrophic disorders of the skin: Secondary | ICD-10-CM | POA: Diagnosis not present

## 2016-07-07 DIAGNOSIS — L814 Other melanin hyperpigmentation: Secondary | ICD-10-CM | POA: Diagnosis not present

## 2016-07-07 DIAGNOSIS — J0100 Acute maxillary sinusitis, unspecified: Secondary | ICD-10-CM

## 2016-07-07 DIAGNOSIS — J440 Chronic obstructive pulmonary disease with acute lower respiratory infection: Secondary | ICD-10-CM

## 2016-07-07 MED ORDER — DOXYCYCLINE HYCLATE 100 MG PO TABS: 100.0000 mg | ORAL_TABLET | Freq: Two times a day (BID) | ORAL | 0 refills | Status: DC

## 2016-07-07 NOTE — Progress Notes (Signed)
Name: Holly Smith   MRN: [id number]    DOB: December 26, 1945   Date:07/07/2016       Progress Note  Subjective  Chief Complaint  Chief Complaint  Patient presents with  . Sinusitis    R) sided facial pressure- R) ear pain- started 5 days ago. some cough with green production    Sinusitis  This is a new problem. The current episode started in the past 7 days. The problem has been gradually worsening since onset. There has been no fever. The fever has been present for less than 1 day. Her pain is at a severity of 7/10. The pain is moderate. Associated symptoms include congestion, coughing, diaphoresis, ear pain, headaches, a hoarse voice, sinus pressure, sneezing and swollen glands. Pertinent negatives include no chills, neck pain, shortness of breath or sore throat. Past treatments include acetaminophen. The treatment provided mild relief.  Cough  This is a new problem. The current episode started in the past 7 days. The problem has been gradually worsening. The cough is productive of purulent sputum (green). Associated symptoms include chest pain, ear pain, headaches, nasal congestion and rhinorrhea. Pertinent negatives include no chills, ear congestion, fever, heartburn, myalgias, rash, sore throat, shortness of breath, weight loss or wheezing. The symptoms are aggravated by pollens. There is no history of environmental allergies.    No problem-specific Assessment & Plan notes found for this encounter.   Past Medical History:  Diagnosis Date  . Abnormal Pap smear of vagina and vaginal HPV   . Arthritis    "bones ache" (01/23/2015)  . Cancer ([place])    vulvar cancer  . CHF (congestive heart failure) ([place])   . Chronic anticoagulation    She is on Xarelto for Afib  . Chronic atrial fibrillation ([place])   . COPD (chronic obstructive pulmonary disease) ([place])    "pulmonary drs said I don't have COPD fall 2016" (01/23/2015)  . Echocardiogram abnormal    Mitral regurgitation, tricuspic  regurgitation  . GERD (gastroesophageal reflux disease)   . History of stress test    Myoview 8/16: EF 42%, anteroseptal, inferoseptal, anterior, apical anterior and apical defect (likely represents breast attenuation versus scar), no ischemia; intermediate risk because of low EF  . HPV (human papilloma virus) anogenital infection    03/21/12 PAP + HR HPV  . Hypertension   . Insulin resistance   . Kidney stones   . Obesity   . OSA on CPAP   . Postoperative nausea and vomiting   . Urolithiasis   . Vulvar cancer ([place]) 2016   invasive verrucous carcinoma of the vulva. History of genital warts and vulvar condyloma    Past Surgical History:  Procedure Laterality Date  . ABDOMINAL HYSTERECTOMY    . APPENDECTOMY    . BILATERAL SALPINGOOPHORECTOMY  2003   benign ovarian cancer  . CARDIOVERSION  2014-2015   "[place]"  . COLONOSCOPY WITH PROPOFOL N/A 06/10/2015   Procedure: COLONOSCOPY WITH PROPOFOL;  Surgeon: Lucilla Lame, MD;  Location: [facility] ENDOSCOPY;  Service: Endoscopy;  Laterality: N/A;  . LITHOTRIPSY    . TONSILLECTOMY    . VULVECTOMY Right 05/10/2014   Excisional biopsy of the superior right labial majora mass; [place] PARTIAL  07/02/2014   Re-excision and sentinel node dissection at [facility].     Family History  Problem Relation Age of Onset  . Prostate cancer Brother 74    still living and well  . Heart attack Father   . Stroke Mother   .  Diabetes Mother   . Hypertension Mother   . Heart attack Brother   . Pancreatic cancer Brother   . Healthy Sister   . Breast cancer Neg Hx     Social History   Social History  . Marital status: Divorced    Spouse name: N/A  . Number of children: N/A  . Years of education: N/A   Occupational History  . Not on file.   Social History Main Topics  . Smoking status: Former Smoker    Packs/day: 1.00    Years: 50.00    Types: Cigarettes, E-cigarettes    Quit date: 05/20/2012  . Smokeless tobacco:  Never Used  . Alcohol use 0.0 oz/week     Comment: 01/23/2015 "I' used to drink socially a few times/month"  . Drug use: No  . Sexual activity: Not Currently   Other Topics Concern  . Not on file   Social History Narrative  . No narrative on file    Allergies  Allergen Reactions  . Ciprofloxacin Other (See Comments)    SOB    Outpatient Medications Prior to Visit  Medication Sig Dispense Refill  . albuterol (PROVENTIL HFA;VENTOLIN HFA) 108 (90 Base) MCG/ACT inhaler Inhale 2 puffs into the lungs every 8 (eight) hours as needed for wheezing or shortness of breath. 1 Inhaler 6  . furosemide (LASIX) 40 MG tablet TAKE 2 TABLETS (80 MG TOTAL) BY MOUTH DAILY. 60 tablet 11  . irbesartan (AVAPRO) 75 MG tablet Take 1 tablet (75 mg total) by mouth daily. 30 tablet 6  . metoprolol succinate (TOPROL-XL) 100 MG 24 hr tablet Take 1 tablet (100 mg total) by mouth 2 (two) times daily. Take with or immediately following a meal. 60 tablet 11  . metoprolol succinate (TOPROL-XL) 50 MG 24 hr tablet Take one tablet by mouth twice a day along with 100mg  tablet to equal 150 mg. Take with or immediately following a meal. 60 tablet 11  . omeprazole (PRILOSEC) 20 MG capsule TAKE 1 CAPSULE (20 MG TOTAL) BY MOUTH ONCE DAILY. 90 capsule 3  . sertraline (ZOLOFT) 25 MG tablet Take 1 tablet (25 mg total) by mouth daily. 90 tablet 1  . XARELTO 20 MG TABS tablet TAKE 1 TABLET (20 MG TOTAL) BY MOUTH DAILY WITH SUPPER. (COPAY IS $215.00 WITH INSURANCE) 30 tablet 10  . doxycycline (VIBRA-TABS) 100 MG tablet Take 1 tablet (100 mg total) by mouth 2 (two) times daily. 20 tablet 0  . guaiFENesin-codeine (ROBITUSSIN AC) 100-10 MG/5ML syrup Take 5 mLs by mouth 3 (three) times daily as needed for cough. 150 mL 0   Facility-Administered Medications Prior to Visit  Medication Dose Route Frequency Provider Last Rate Last Dose  . albuterol (PROVENTIL) (2.5 MG/3ML) 0.083% nebulizer solution 2.5 mg  2.5 mg Nebulization Once Juline Patch, MD        Review of Systems  Constitutional: Positive for diaphoresis. Negative for chills, fever, malaise/fatigue and weight loss.  HENT: Positive for congestion, ear pain, hoarse voice, rhinorrhea, sinus pressure and sneezing. Negative for ear discharge and sore throat.   Eyes: Negative for blurred vision.  Respiratory: Positive for cough. Negative for sputum production, shortness of breath and wheezing.   Cardiovascular: Positive for chest pain. Negative for palpitations and leg swelling.  Gastrointestinal: Negative for abdominal pain, blood in stool, constipation, diarrhea, heartburn, melena and nausea.  Genitourinary: Negative for dysuria, frequency, hematuria and urgency.  Musculoskeletal: Negative for back pain, joint pain, myalgias and neck pain.  Skin: Negative  for rash.  Neurological: Positive for headaches. Negative for dizziness, tingling, sensory change and focal weakness.  Endo/Heme/Allergies: Negative for environmental allergies and polydipsia. Does not bruise/bleed easily.  Psychiatric/Behavioral: Negative for depression and suicidal ideas. The patient is not nervous/anxious and does not have insomnia.      Objective  Vitals:   07/07/16 1041  BP: 120/80  Pulse: 64  Weight: 222 lb (100.7 kg)  Height: 5' (1.524 m)    Physical Exam  Constitutional: She is well-developed, well-nourished, and in no distress. No distress.  HENT:  Head: Normocephalic and atraumatic.  Right Ear: Tympanic membrane, external ear and ear canal normal.  Left Ear: Tympanic membrane, external ear and ear canal normal.  Nose: Right sinus exhibits maxillary sinus tenderness and frontal sinus tenderness. Left sinus exhibits maxillary sinus tenderness and frontal sinus tenderness.  Mouth/Throat: Oropharynx is clear and moist.  Eyes: Conjunctivae and EOM are normal. Pupils are equal, round, and reactive to light. Right eye exhibits no discharge. Left eye exhibits no discharge.  Neck: Normal  range of motion. Neck supple. No JVD present. No thyromegaly present.  Cardiovascular: Normal rate, regular rhythm, normal heart sounds and intact distal pulses.  Exam reveals no gallop and no friction rub.   No murmur heard. Pulmonary/Chest: Effort normal and breath sounds normal. She has no wheezes. She has no rales.  Abdominal: Soft. Bowel sounds are normal. She exhibits no mass. There is no tenderness. There is no guarding.  Musculoskeletal: Normal range of motion. She exhibits no edema.  Lymphadenopathy:    She has no cervical adenopathy.  Neurological: She is alert. She has normal reflexes.  Skin: Skin is warm and dry. She is not diaphoretic.  Psychiatric: Mood and affect normal.  Nursing note and vitals reviewed.     Assessment & Plan  Problem List Items Addressed This Visit    None    Visit Diagnoses    Acute non-recurrent pansinusitis    -  Primary   Relevant Medications   doxycycline (VIBRA-TABS) 100 MG tablet   Bronchitis       cough med at home   Relevant Medications   doxycycline (VIBRA-TABS) 100 MG tablet   Acute bronchitis with COPD (HCC)       Relevant Medications   doxycycline (VIBRA-TABS) 100 MG tablet   Acute maxillary sinusitis, recurrence not specified       Relevant Medications   doxycycline (VIBRA-TABS) 100 MG tablet      Meds ordered this encounter  Medications  . doxycycline (VIBRA-TABS) 100 MG tablet    Sig: Take 1 tablet (100 mg total) by mouth 2 (two) times daily.    Dispense:  20 tablet    Refill:  0      Dr. Deanna Jones [facility]  07/07/16

## 2016-07-20 ENCOUNTER — Other Ambulatory Visit: Payer: Self-pay | Admitting: *Deleted

## 2016-07-20 MED ORDER — METOPROLOL SUCCINATE ER 100 MG PO TB24: 100.0000 mg | ORAL_TABLET | Freq: Two times a day (BID) | ORAL | 11 refills | Status: DC

## 2016-07-20 MED ORDER — METOPROLOL SUCCINATE ER 50 MG PO TB24: ORAL_TABLET | ORAL | 11 refills | Status: DC

## 2016-07-21 ENCOUNTER — Telehealth: Payer: Self-pay | Admitting: *Deleted

## 2016-07-21 NOTE — Telephone Encounter (Signed)
Called and spoke with patient. She states that before this past Sunday she was taking Metoprolol tartrate 100 mg BID and metoprolol succinate 50MG  BID. She states that she went to get her refill this past Sunday and since then she has been taking metoprolol succinate 100MG  BID and metoprolol succinate 50MG  BID (both time released). She states that the 100 mg time released is too much for her that she feels "wound up" and she wants to go back to taking the Metoprolol tartrate 100MG  BID, instead of the time released, and keep taking the metoprolol succinate 50 MG BID.   Per Dr. Hassell Done notes, metoprolol  is per Dr. Jackalyn Lombard recommendations. Please advise on which metoprolol patient should be taking.

## 2016-07-22 MED ORDER — METOPROLOL TARTRATE 100 MG PO TABS: 100.0000 mg | ORAL_TABLET | Freq: Two times a day (BID) | ORAL | 3 refills | Status: DC

## 2016-07-22 NOTE — Telephone Encounter (Signed)
confirmed with pt that she wanted Metoprolol (lopressor) for the 100 mg & Metoprolol (Toprol-Xl) for the 50 mg, sent RX accordingly via DR Allred's note and confirmation with pt.

## 2016-07-22 NOTE — Telephone Encounter (Signed)
I would adjust as to what she was taking and doing well with. Metoprolol tartrate 100MG  BID, instead of the time released, and keep taking the metoprolol succinate 50 MG BID.  Please confirm with patient.  Thanks!

## 2016-08-11 ENCOUNTER — Inpatient Hospital Stay: Payer: Medicare Other

## 2016-08-25 ENCOUNTER — Inpatient Hospital Stay: Payer: Medicare Other | Attending: Obstetrics and Gynecology | Admitting: Obstetrics and Gynecology

## 2016-08-25 ENCOUNTER — Encounter: Payer: Self-pay | Admitting: Obstetrics and Gynecology

## 2016-08-25 VITALS — BP 138/82 | HR 91 | Temp 97.7°F | Ht 61.0 in | Wt 214.7 lb

## 2016-08-25 DIAGNOSIS — E785 Hyperlipidemia, unspecified: Secondary | ICD-10-CM | POA: Diagnosis not present

## 2016-08-25 DIAGNOSIS — C519 Malignant neoplasm of vulva, unspecified: Secondary | ICD-10-CM | POA: Diagnosis present

## 2016-08-25 DIAGNOSIS — K5732 Diverticulitis of large intestine without perforation or abscess without bleeding: Secondary | ICD-10-CM | POA: Diagnosis not present

## 2016-08-25 DIAGNOSIS — Z87891 Personal history of nicotine dependence: Secondary | ICD-10-CM | POA: Diagnosis not present

## 2016-08-25 DIAGNOSIS — I071 Rheumatic tricuspid insufficiency: Secondary | ICD-10-CM

## 2016-08-25 DIAGNOSIS — Z79899 Other long term (current) drug therapy: Secondary | ICD-10-CM | POA: Diagnosis not present

## 2016-08-25 DIAGNOSIS — G4733 Obstructive sleep apnea (adult) (pediatric): Secondary | ICD-10-CM

## 2016-08-25 DIAGNOSIS — Z7901 Long term (current) use of anticoagulants: Secondary | ICD-10-CM | POA: Diagnosis not present

## 2016-08-25 DIAGNOSIS — I110 Hypertensive heart disease with heart failure: Secondary | ICD-10-CM | POA: Diagnosis not present

## 2016-08-25 DIAGNOSIS — I340 Nonrheumatic mitral (valve) insufficiency: Secondary | ICD-10-CM | POA: Insufficient documentation

## 2016-08-25 DIAGNOSIS — Z90710 Acquired absence of both cervix and uterus: Secondary | ICD-10-CM | POA: Diagnosis not present

## 2016-08-25 DIAGNOSIS — A630 Anogenital (venereal) warts: Secondary | ICD-10-CM | POA: Insufficient documentation

## 2016-08-25 DIAGNOSIS — I5042 Chronic combined systolic (congestive) and diastolic (congestive) heart failure: Secondary | ICD-10-CM

## 2016-08-25 DIAGNOSIS — Z90722 Acquired absence of ovaries, bilateral: Secondary | ICD-10-CM | POA: Diagnosis not present

## 2016-08-25 DIAGNOSIS — I361 Nonrheumatic tricuspid (valve) insufficiency: Secondary | ICD-10-CM | POA: Diagnosis not present

## 2016-08-25 DIAGNOSIS — I481 Persistent atrial fibrillation: Secondary | ICD-10-CM

## 2016-08-25 DIAGNOSIS — J449 Chronic obstructive pulmonary disease, unspecified: Secondary | ICD-10-CM | POA: Insufficient documentation

## 2016-08-25 DIAGNOSIS — E669 Obesity, unspecified: Secondary | ICD-10-CM

## 2016-08-25 NOTE — Progress Notes (Signed)
Gynecologic Oncology Interval Note  Referring Provider: Dr. Laverta Baltimore  Chief Concern: Vulvar cancer surveillance.   Subjective:  Holly Smith is a 71 y.o. woman who presents today for continued surveillance for history of invasive vulvar cancer.   No new lesions or other vulvar complaints today.  Still has urinary leakage for which she uses Vesicare.  PAP 3/16 normal.  No gyn complaints.   She has been having some abdominal complaints and is going to be evaluated in follow up by GI for recurrence of diverticulitis.   She was in the hospital last year for treatment of this condition.  Oncology Treatment History:  Patient had warty lesion on the upper right labia for about two years.    Hysterectomy in the past for benign disease.  Then BSO in 2003 for benign ovarian tumor.  03/21/12 PAP + HR HPV vaginal bx 2 o'clock negative, 9 o'clock HPV effect  Vulvar itching and pain.  07/18/12 - vulvar bx showed condyloma  6/72/09 - vulvar bx - lichen sclerosis  4/70/96 - Exophytic mass on right anterior vulva. 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  Negative margins, but within 40mm of cancer.  No LVSI.    We discussed the rationale for re-excision of the scar to insure that there is no residual cancer present in view of the close 2 mm margin and sentinel lymph node mapping and biopsy because the tumor has > 60mm invasion.  Had partial right modified radical vulvectomy on 07/02/14 at [facility] for invasive verrucous carcinoma of the vulva. The final pathology revealed: negative lymph nodes, no evidence of residual disease.      Problem List: Patient Active Problem List   Diagnosis Date Noted  . Hypertensive heart disease 05/12/2016  . Persistent atrial fibrillation ([place]) 08/13/2015  . Chronic obstructive pulmonary disease ([place]) 08/13/2015  . Personal history of other specified conditions 08/13/2015  . Obesity, diabetes,  and hypertension syndrome ([place]) 08/13/2015  . Adiposity 08/13/2015  . Special screening for malignant neoplasms, colon   . Benign neoplasm of ascending colon   . Benign neoplasm of descending colon   . Benign neoplasm of sigmoid colon   . Chronic diastolic heart failure ([place]) 05/05/2015  . Diverticulitis 01/31/2015  . Diverticulitis large intestine w/o perforation or abscess w/bleeding 01/28/2015  . Chronic combined systolic and diastolic CHF, NYHA class 3 ([place]) 01/28/2015  . Acute diverticulitis 01/28/2015  . Diverticulitis of large intestine without perforation or abscess with bleeding   . Acute on chronic diastolic heart failure ([place]) 01/21/2015  . Hypertensive urgency 01/21/2015  . Morbid obesity ([place]) 01/21/2015  . Blood in stool 01/21/2015  . Acute diastolic CHF (congestive heart failure) ([place]) 01/21/2015  . CAFL (chronic airflow limitation) ([place]) 12/16/2014  . Personal history of perinatal problems 12/16/2014  . BP (high blood pressure) 12/16/2014  . HLD (hyperlipidemia) 12/16/2014  . Dysmetabolic syndrome 28/36/6294  . Calculus of kidney 12/16/2014  . Beat, premature ventricular 12/16/2014  . Snores 12/16/2014  . Stress incontinence 12/16/2014  . Fast heart beat 12/16/2014  . Calculus (=stone) 12/16/2014  . OSA (obstructive sleep apnea) 11/27/2014  . Dyspnea 10/07/2014  . Essential hypertension 10/07/2014  . Obesity 10/07/2014  . Genital warts 08/02/2014  . Condyloma acuminatum of vulva 08/02/2014  . Condylomata lata of vulva 08/02/2014  . History of anticoagulant therapy 08/02/2014  . Chronic atrial fibrillation ([place]) 08/02/2014  . Current tobacco use 08/02/2014  . 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 Malignant neoplasm of vulva ([place]) 06/19/2014  . Vulvar cancer ([place]) 05/10/2014  . Vulvar cancer, carcinoma ([place]) 05/10/2014  . 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 Chest pain 08/20/2013  . Breath shortness 08/20/2013  . CCF (congestive  cardiac failure) ([place]) 08/03/2013  . Congestive heart failure ([place]) 08/03/2013  . History of biliary T-tube placement 06/21/2013  . Abnormal echocardiogram 04/23/2013  . MI (mitral incompetence) 04/23/2013  . TI (tricuspid incompetence) 04/23/2013    Past Medical History: Past Medical History:  Diagnosis Date  . Abnormal Pap smear of vagina and vaginal HPV   . Arthritis    "bones ache" (01/23/2015)  . Cancer ([place])    vulvar cancer  . CHF (congestive heart failure) ([place])   . Chronic anticoagulation    She is on Xarelto for Afib  . Chronic atrial fibrillation ([place])   . COPD (chronic obstructive pulmonary disease) ([place])    "pulmonary drs said I don't have COPD fall 2016" (01/23/2015)  . Echocardiogram abnormal    Mitral regurgitation, tricuspic regurgitation  . GERD (gastroesophageal reflux disease)   . History of stress test    Myoview 8/16: EF 42%, anteroseptal, inferoseptal, anterior, apical anterior and apical defect (likely represents breast attenuation versus scar), no ischemia; intermediate risk because of low EF  . HPV (human papilloma virus) anogenital infection    03/21/12 PAP + HR HPV  . Hypertension   . Insulin resistance   . Kidney stones   . Obesity   . OSA on CPAP   . Postoperative nausea and vomiting   . Urolithiasis   . Vulvar cancer ([place]) 2016   invasive verrucous carcinoma of the vulva. History of genital warts and vulvar condyloma    Past Surgical History: Past Surgical History:  Procedure Laterality Date  . ABDOMINAL HYSTERECTOMY    . APPENDECTOMY    . BILATERAL SALPINGOOPHORECTOMY  2003   benign ovarian cancer  . CARDIOVERSION  2014-2015   "[place]"  . COLONOSCOPY WITH PROPOFOL N/A 06/10/2015   Procedure: COLONOSCOPY WITH PROPOFOL;  Surgeon: Lucilla Lame, MD;  Location: [facility] ENDOSCOPY;  Service: Endoscopy;  Laterality: N/A;  . LITHOTRIPSY    . TONSILLECTOMY    . VULVECTOMY Right 05/10/2014   Excisional biopsy of the superior right labial  majora mass; [place] PARTIAL  07/02/2014   Re-excision and sentinel node dissection at [facility].     Family History: Family History  Problem Relation Age of Onset  . Prostate cancer Brother 68       still living and well  . Heart attack Father   . Stroke Mother   . Diabetes Mother   . Hypertension Mother   . Heart attack Brother   . Pancreatic cancer Brother   . Healthy Sister   . Breast cancer Neg Hx     Social History: Social History   Social History  . Marital status: Divorced    Spouse name: N/A  . Number of children: N/A  . Years of education: N/A   Occupational History  . Not on file.   Social History Main Topics  . Smoking status: Former Smoker    Packs/day: 1.00    Years: 50.00    Types: Cigarettes, E-cigarettes    Quit date: 05/20/2012  . Smokeless tobacco: Never Used  . Alcohol use 0.0 oz/week     Comment: 01/23/2015 "I' used to drink socially a few times/month"  . Drug use: No  .  Sexual activity: Not Currently   Other Topics Concern  . Not on file   Social History Narrative  . No narrative on file    Allergies: Allergies  Allergen Reactions  . Ciprofloxacin Other (See Comments)    SOB    Current Medications: Current Outpatient Prescriptions  Medication Sig Dispense Refill  . albuterol (PROVENTIL HFA;VENTOLIN HFA) 108 (90 Base) MCG/ACT inhaler Inhale 2 puffs into the lungs every 8 (eight) hours as needed for wheezing or shortness of breath. 1 Inhaler 6  . doxycycline (VIBRA-TABS) 100 MG tablet Take 1 tablet (100 mg total) by mouth 2 (two) times daily. 20 tablet 0  . furosemide (LASIX) 40 MG tablet TAKE 2 TABLETS (80 MG TOTAL) BY MOUTH DAILY. 60 tablet 11  . irbesartan (AVAPRO) 75 MG tablet Take 1 tablet (75 mg total) by mouth daily. 30 tablet 6  . metoprolol (LOPRESSOR) 100 MG tablet Take 1 tablet (100 mg total) by mouth 2 (two) times daily. 180 tablet 3  . metoprolol succinate (TOPROL-XL) 50 MG 24 hr tablet Take one tablet  by mouth twice a day along with 100mg  tablet to equal 150 mg. Take with or immediately following a meal. 60 tablet 11  . omeprazole (PRILOSEC) 20 MG capsule TAKE 1 CAPSULE (20 MG TOTAL) BY MOUTH ONCE DAILY. 90 capsule 3  . sertraline (ZOLOFT) 25 MG tablet Take 1 tablet (25 mg total) by mouth daily. 90 tablet 1  . XARELTO 20 MG TABS tablet TAKE 1 TABLET (20 MG TOTAL) BY MOUTH DAILY WITH SUPPER. (COPAY IS $215.00 WITH INSURANCE) 30 tablet 10   Current Facility-Administered Medications  Medication Dose Route Frequency Provider Last Rate Last Dose  . albuterol (PROVENTIL) (2.5 MG/3ML) 0.083% nebulizer solution 2.5 mg  2.5 mg Nebulization Once Juline Patch, MD          Review of Systems Pertinent items are noted in HPI.  Objective:  Physical Examination:  BP 138/82 (BP Location: Left Arm, Patient Position: Sitting)   Pulse 91   Temp 97.7 F (36.5 C) (Oral)   Ht 5\' 1"  (1.549 m)   Wt 214 lb 11.2 oz (97.4 kg)   BMI 40.57 kg/m   ECOG Performance Status: 0 - Asymptomatic  General appearance: alert, cooperative and appears stated age HEENT:PERRLA, neck supple with midline trachea and thyroid without masses Lymph node survey: non-palpable, axillary, inguinal, supraclavicular Cardiovascular: regular rate and rhythm, no murmurs or gallops Respiratory: normal air entry, lungs clear to auscultation Breast exam: not examined. Abdomen: soft, non-tender, without masses or organomegaly, no hernias and well healed incision Back: inspection of back is normal Extremities: extremities normal, atraumatic, no cyanosis or edema Skin exam - normal coloration and turgor, no rashes, no suspicious skin lesions noted. Neurological exam reveals alert, oriented, normal speech, no focal findings or movement disorder noted.  Pelvic: exam chaperoned by nurse;  Vulva: normal appearing vulva with no masses, tenderness or lesions s/p partial right vulvectomy; Urethral meatus discolored significantly but no  palpable mass; Vagina: normal vagina; Adnexa: normal adnexa in size, nontender and no masses; Rectal: normal rectal, no masses.  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  No evidence of disease.   Urethral meatus is discolored.  Not sure if this is a urethral caruncle or due to irritation from pads she wears for urinary incontinence, but would like to refer her to Dr.  Erlene Quan in Urology for evaluation.  Plan:   Problem List Items Addressed This Visit      Other   Malignant neoplasm of vulva ([place]) - Primary     Reassurance given with respect to vulvar cancer.  Suggested return to clinic in  6 months.  She is comfortable with the plan and had her questions answered. I offered referral to Michelene Heady for management of incontinence in the past, but she declined.  Will see Dr. Erlene Quan in Urology for evaluation of abnormal appearing urethral meatus and incontinence.  She has been having some abdominal complaints and is going to be evaluated in follow up by GI for recurrence of diverticulitis.   She was in the hospital last year for treatment of this condition.   Mellody Drown, MD  CC:  Juline Patch, MD [address] [place], [place] [address] [phone]

## 2016-08-25 NOTE — Progress Notes (Signed)
  Oncology Nurse Navigator Documentation Chaperoned pelvic exam. Referral entered for [facility] for urethral meatus lesion evaluation.  Navigator Location: [facility] On[facility] (08/25/16 1000)   )Navigator Encounter Type: Follow-up Appt (08/25/16 1000)                     Patient Visit Type: GynOnc (08/25/16 1000)                              Time Spent with Patient: 15 (08/25/16 1000)

## 2016-09-01 ENCOUNTER — Encounter: Payer: Self-pay | Admitting: Urology

## 2016-09-01 ENCOUNTER — Ambulatory Visit (INDEPENDENT_AMBULATORY_CARE_PROVIDER_SITE_OTHER): Payer: Medicare Other | Admitting: Urology

## 2016-09-01 VITALS — BP 108/73 | HR 79 | Ht 61.0 in | Wt 212.7 lb

## 2016-09-01 DIAGNOSIS — N393 Stress incontinence (female) (male): Secondary | ICD-10-CM | POA: Diagnosis not present

## 2016-09-01 DIAGNOSIS — N3281 Overactive bladder: Secondary | ICD-10-CM | POA: Diagnosis not present

## 2016-09-01 DIAGNOSIS — Z87442 Personal history of urinary calculi: Secondary | ICD-10-CM | POA: Diagnosis not present

## 2016-09-01 DIAGNOSIS — N362 Urethral caruncle: Secondary | ICD-10-CM | POA: Diagnosis not present

## 2016-09-01 NOTE — Progress Notes (Signed)
New patient

## 2016-09-01 NOTE — Progress Notes (Signed)
09/01/2016 10:14 AM   Holly Smith Sep 25, 1945 [id number]  Referring provider: Juline Patch, MD [address] [place], [place] [address]  Chief Complaint  Patient presents with  . New Patient (Initial Visit)    malignant neoplasm of vulva referred by Dr. Fransisca Connors    HPI: 71 year old female who presents today referred by Dr. Fransisca Connors (gyn/onc) referred for further evaluation of urethral meatus abnormality as well as incontinence.  She underwent excision of a right anterior vulvar mass on 2/16 showed a grade 1 verrucous carcinoma with 2.4 mm of invasion. The margins were negative but within 2 mm of the cancer. No lymphovascular invasion.  She returned for reexcision as well as sentinel node mapping and biopsy status post right partially modified vulvectomy. Final pathology revealed no evidence of residual disease and negative lymph nodes.   At the time of her most recent exam, she was noted to have a discolored urethral meatus. She was referred for further evaluation.   She reports that she leaks when she laughs, coughs and lifts, strains.  She wears about 3-4 ppd but not completely saturated.  This is long-standing. She does have a history of obesity.  She as not tried PT or kegels.     She does have urgency and urge incontinence on a daily basis. Her symptoms are primarily in the daytime, especially after taking Lasix at which time she voids at least every 20 minutes. She denies significant nocturia. She does feel that she is able to empty her bladder. She has tried [employer] in the past which helped with her symptoms but was prohivitviely expensive.  She also tried one which started with a "t" but which caused phycological symptoms.    She is on lasix 80 mg daily for CHF.  Two vaginal deliveries int he past.  She is s/p open hysterectomy followed by BSO 2003.     She also has a personal history of kidney stones. She was previously followed by Dr. Diona Fanti in  [place] and underwent shockwave lithotripsy at least 10 years ago. Most recent cross-sectional imaging in the form of CT abdomen pelvis in 2016 showed no obvious nephrolithiasis. She denies any flank pain or gross hematuria.   PMH: Past Medical History:  Diagnosis Date  . Abnormal Pap smear of vagina and vaginal HPV   . Arthritis    "bones ache" (01/23/2015)  . Cancer ([place])    vulvar cancer  . CHF (congestive heart failure) ([place])   . Chronic anticoagulation    She is on Xarelto for Afib  . Chronic atrial fibrillation ([place])   . Echocardiogram abnormal    Mitral regurgitation, tricuspic regurgitation  . GERD (gastroesophageal reflux disease)   . History of stress test    Myoview 8/16: EF 42%, anteroseptal, inferoseptal, anterior, apical anterior and apical defect (likely represents breast attenuation versus scar), no ischemia; intermediate risk because of low EF  . HPV (human papilloma virus) anogenital infection    03/21/12 PAP + HR HPV  . Hypertension   . Insulin resistance   . Kidney stones   . Obesity   . OSA on CPAP   . Postoperative nausea and vomiting   . Urolithiasis   . Vulvar cancer ([place]) 2016   invasive verrucous carcinoma of the vulva. History of genital warts and vulvar condyloma    Surgical History: Past Surgical History:  Procedure Laterality Date  . ABDOMINAL HYSTERECTOMY    . APPENDECTOMY    . BILATERAL SALPINGOOPHORECTOMY  2003  benign ovarian cancer  . CARDIOVERSION  2014-2015   "[place]"  . COLONOSCOPY WITH PROPOFOL N/A 06/10/2015   Procedure: COLONOSCOPY WITH PROPOFOL;  Surgeon: Lucilla Lame, MD;  Location: [facility] ENDOSCOPY;  Service: Endoscopy;  Laterality: N/A;  . LITHOTRIPSY    . TONSILLECTOMY    . VULVECTOMY Right 05/10/2014   Excisional biopsy of the superior right labial majora mass; [place] PARTIAL  07/02/2014   Re-excision and sentinel node dissection at [facility].     Home Medications:  Allergies as of  09/01/2016      Reactions   Ciprofloxacin Other (See Comments)   SOB      Medication List       Accurate as of 09/01/16 11:59 PM. Always use your most recent med list.          albuterol 108 (90 Base) MCG/ACT inhaler Commonly known as:  PROVENTIL HFA;VENTOLIN HFA Inhale 2 puffs into the lungs every 8 (eight) hours as needed for wheezing or shortness of breath.   furosemide 40 MG tablet Commonly known as:  LASIX TAKE 2 TABLETS (80 MG TOTAL) BY MOUTH DAILY.   irbesartan 75 MG tablet Commonly known as:  AVAPRO Take 1 tablet (75 mg total) by mouth daily.   metoprolol succinate 50 MG 24 hr tablet Commonly known as:  TOPROL-XL Take one tablet by mouth twice a day along with 100mg  tablet to equal 150 mg. Take with or immediately following a meal.   metoprolol tartrate 100 MG tablet Commonly known as:  LOPRESSOR Take 1 tablet (100 mg total) by mouth 2 (two) times daily.   omeprazole 20 MG capsule Commonly known as:  PRILOSEC TAKE 1 CAPSULE (20 MG TOTAL) BY MOUTH ONCE DAILY.   sertraline 25 MG tablet Commonly known as:  ZOLOFT Take 1 tablet (25 mg total) by mouth daily.   vitamin B-12 1000 MCG tablet Commonly known as:  CYANOCOBALAMIN Take 1,000 mcg by mouth daily.   XARELTO 20 MG Tabs tablet Generic drug:  rivaroxaban TAKE 1 TABLET (20 MG TOTAL) BY MOUTH DAILY WITH SUPPER. (COPAY IS $215.00 WITH INSURANCE)       Allergies:  Allergies  Allergen Reactions  . Ciprofloxacin Other (See Comments)    SOB    Family History: Family History  Problem Relation Age of Onset  . Prostate cancer Brother 68       still living and well  . Heart attack Father   . Stroke Mother   . Diabetes Mother   . Hypertension Mother   . Heart attack Brother   . Pancreatic cancer Brother   . Healthy Sister   . Breast cancer Neg Hx   . Kidney cancer Neg Hx   . Bladder Cancer Neg Hx     Social History:  reports that she quit smoking about 4 years ago. Her smoking use included  Cigarettes and E-cigarettes. She has a 50.00 pack-year smoking history. She has never used smokeless tobacco. She reports that she drinks alcohol. She reports that she does not use drugs.  ROS: UROLOGY Frequent Urination?: Yes Hard to postpone urination?: Yes Burning/pain with urination?: No Get up at night to urinate?: Yes Leakage of urine?: Yes Urine stream starts and stops?: No Trouble starting stream?: No Do you have to strain to urinate?: No Blood in urine?: No Urinary tract infection?: No Sexually transmitted disease?: No Injury to kidneys or bladder?: No Painful intercourse?: No Weak stream?: No Currently pregnant?: No Vaginal bleeding?: No Last menstrual period?:  n  Gastrointestinal Nausea?: Yes Vomiting?: Yes Indigestion/heartburn?: No Diarrhea?: Yes Constipation?: No  Constitutional Fever: No Night sweats?: Yes Weight loss?: Yes Fatigue?: Yes  Skin Skin rash/lesions?: No Itching?: No  Eyes Blurred vision?: Yes Double vision?: No  Ears/Nose/Throat Sore throat?: Yes Sinus problems?: Yes  Hematologic/Lymphatic Swollen glands?: No Easy bruising?: Yes  Cardiovascular Leg swelling?: Yes Chest pain?: Yes  Respiratory Cough?: No Shortness of breath?: Yes  Endocrine Excessive thirst?: No  Musculoskeletal Back pain?: Yes Joint pain?: Yes  Neurological Headaches?: Yes Dizziness?: No  Psychologic Depression?: Yes Anxiety?: No  Physical Exam: BP 108/73   Pulse 79   Ht 5\' 1"  (1.549 m)   Wt 212 lb 11.2 oz (96.5 kg)   BMI 40.19 kg/m   Constitutional:  Alert and oriented, No acute distress.   HEENT: [place] AT, moist mucus membranes.  Trachea midline, no masses. Cardiovascular: No clubbing, cyanosis, or edema. Respiratory: Normal respiratory effort, no increased work of breathing. GI: Abdomen is soft, nontender, nondistended, no abdominal masses.  Obese.   GU: No CVA tenderness.  Pelvic: Right labia surgically absent.  Mildly atrophic  vaginitis appreciated. Shortened vaginal vault without significant prolapse. Urethral meatus with small caruncle at 6:00, somewhat hypopigmented in appearance. She also has whitish adherent plaque-like material on her anterior vaginal wall, ? Lichens   Skin: No rashes, bruises or suspicious lesions. Neurologic: Grossly intact, no focal deficits, moving all 4 extremities. Psychiatric: Normal mood and affect.  Laboratory Data: Lab Results  Component Value Date   WBC 6.7 01/31/2015   HGB 11.8 (L) 01/31/2015   HCT 37.6 01/31/2015   MCV 95.9 01/31/2015   PLT 227 01/31/2015    Lab Results  Component Value Date   CREATININE 0.99 03/18/2016    Lab Results  Component Value Date   HGBA1C 6.9 (H) 03/18/2016    .this Urinalysis N/a  Pertinent Imaging: n/a  Assessment & Plan:    1. Urethral caruncle Pelvic exam findings most consistent with urethral caruncle, although it is somewhat hypopigmented. I recommended proceeding with cystoscopy to further evaluate the urethra supper caution. She is agreeable to this plan.  2. Stress incontinence of urine Long-standing stress urinary incontinence Discussed contrubiting factors including pelvic floor laxity, lack of support, and obesity Options for treatment were reviewed today, which recommended starting conservatively with weight loss and pelvic floor exercises Offered pelvic floor therapy but declined, information given today about Kegel exercises Discussed surgery as well, not a great surgical candidate given comorbidities and patient is not interested in this  3. Overactive bladder Behavioral modification was discussed She has multiple contributing factors including medications (Lasix), OSA, and prediabetes amongst others. We'll consider addition of OAB med for urinary urgency/frequency, and urge incontinence which may help increase bladder capacity with Lasix administration. Mybetriq 25 mg 3 weeks samples given today, we'll reassess  her next visit  4. History of kidney stones Currently asymptomatic No stone seen on CT scan on 01/2015 --> personally reviewed  Return in about 3 weeks (around 09/22/2016) for cysto, recheck bladder symptoms, PVR.  Hollice Espy, MD  [facility] [address] [place], [place] [address] [phone]

## 2016-09-03 ENCOUNTER — Telehealth: Payer: Self-pay

## 2016-09-03 ENCOUNTER — Other Ambulatory Visit: Payer: Self-pay

## 2016-09-03 ENCOUNTER — Ambulatory Visit: Payer: Self-pay | Admitting: Family Medicine

## 2016-09-03 MED ORDER — DOXYCYCLINE HYCLATE 100 MG PO TABS: 100.0000 mg | ORAL_TABLET | Freq: Two times a day (BID) | ORAL | 0 refills | Status: DC

## 2016-09-03 NOTE — Telephone Encounter (Signed)
Pt called and said she wanted a refill on antibiotic- she's had a lot of doctor's appts. Called in Doxy and told her to call on Monday if not better

## 2016-09-07 ENCOUNTER — Ambulatory Visit (INDEPENDENT_AMBULATORY_CARE_PROVIDER_SITE_OTHER): Payer: Medicare Other | Admitting: Gastroenterology

## 2016-09-07 ENCOUNTER — Other Ambulatory Visit: Payer: Self-pay

## 2016-09-07 ENCOUNTER — Encounter: Payer: Self-pay | Admitting: Gastroenterology

## 2016-09-07 ENCOUNTER — Other Ambulatory Visit
Admission: RE | Admit: 2016-09-07 | Discharge: 2016-09-07 | Disposition: A | Discharge status: Home / Self Care | Payer: Medicare Other | Source: Ambulatory Visit | Attending: Gastroenterology | Admitting: Gastroenterology

## 2016-09-07 ENCOUNTER — Ambulatory Visit: Payer: Medicare Other | Admitting: Gastroenterology

## 2016-09-07 VITALS — BP 136/79 | HR 96 | Temp 98.1°F | Ht 61.0 in | Wt 212.8 lb

## 2016-09-07 DIAGNOSIS — R1013 Epigastric pain: Secondary | ICD-10-CM | POA: Diagnosis not present

## 2016-09-07 DIAGNOSIS — R634 Abnormal weight loss: Secondary | ICD-10-CM

## 2016-09-07 DIAGNOSIS — H16202 Unspecified keratoconjunctivitis, left eye: Secondary | ICD-10-CM | POA: Diagnosis not present

## 2016-09-07 DIAGNOSIS — R1011 Right upper quadrant pain: Secondary | ICD-10-CM | POA: Diagnosis not present

## 2016-09-07 DIAGNOSIS — Z87898 Personal history of other specified conditions: Secondary | ICD-10-CM | POA: Insufficient documentation

## 2016-09-07 LAB — CBC WITH DIFFERENTIAL/PLATELET
BASOS ABS: 0.1 10*3/uL (ref 0–0.1)
Basophils Relative: 1 %
Eosinophils Absolute: 0.2 10*3/uL (ref 0–0.7)
Eosinophils Relative: 2 %
HEMATOCRIT: 46.4 % (ref 35.0–47.0)
HEMOGLOBIN: 15.4 g/dL (ref 12.0–16.0)
LYMPHS PCT: 26 %
Lymphs Abs: 2.5 10*3/uL (ref 1.0–3.6)
MCH: 29.9 pg (ref 26.0–34.0)
MCHC: 33.1 g/dL (ref 32.0–36.0)
MCV: 90.3 fL (ref 80.0–100.0)
MONO ABS: 1 10*3/uL — AB (ref 0.2–0.9)
Monocytes Relative: 10 %
NEUTROS ABS: 6.1 10*3/uL (ref 1.4–6.5)
NEUTROS PCT: 61 %
Platelets: 249 10*3/uL (ref 150–440)
RBC: 5.14 MIL/uL (ref 3.80–5.20)
RDW: 13.6 % (ref 11.5–14.5)
WBC: 9.9 10*3/uL (ref 3.6–11.0)

## 2016-09-07 LAB — COMPREHENSIVE METABOLIC PANEL
ALBUMIN: 3.9 g/dL (ref 3.5–5.0)
ALT: 14 U/L (ref 14–54)
AST: 19 U/L (ref 15–41)
Alkaline Phosphatase: 85 U/L (ref 38–126)
Anion gap: 11 (ref 5–15)
BUN: 19 mg/dL (ref 6–20)
CO2: 29 mmol/L (ref 22–32)
Calcium: 9.4 mg/dL (ref 8.9–10.3)
Chloride: 100 mmol/L — ABNORMAL LOW (ref 101–111)
Creatinine, Ser: 1.03 mg/dL — ABNORMAL HIGH (ref 0.44–1.00)
GFR calc Af Amer: >60 mL/min (ref >60)
GFR calc non Af Amer: 53 mL/min — ABNORMAL LOW (ref >60)
GLUCOSE: 114 mg/dL — AB (ref 65–99)
Potassium: 3.1 mmol/L — ABNORMAL LOW (ref 3.5–5.1)
SODIUM: 140 mmol/L (ref 135–145)
TOTAL PROTEIN: 8.6 g/dL — AB (ref 6.5–8.1)
Total Bilirubin: 0.9 mg/dL (ref 0.3–1.2)

## 2016-09-07 LAB — TSH: TSH: 2.05 u[IU]/mL (ref 0.350–4.500)

## 2016-09-07 MED ORDER — DICYCLOMINE HCL 10 MG PO CAPS: 10.0000 mg | ORAL_CAPSULE | Freq: Three times a day (TID) | ORAL | 0 refills | Status: DC

## 2016-09-07 NOTE — Progress Notes (Signed)
Jonathon Bellows MD, MRCP(U.K) [address]  [place]  [place], [place] [address]  Main: [phone]  Fax: [phone]   Gastroenterology Consultation  Referring Provider:     Juline Patch, MD Primary Care Physician:  Juline Patch, MD Primary Gastroenterologist:  Dr. Jonathon Bellows  Reason for Consultation: abdominal pain    Abdominal pain: Onset: 3-4 years back , occurs daily lasting a few minutes  Site :RUQ,RLQ,epigastric Radiation: localized  Severity :mild  Petra Kuba of pain:  Sharp  Aggravating factors: laying on the right side , eating , usually within 20 mins , followed by a watery bowel movement which relives the pain  Relieving factors :bowel movement  Weight loss: lost 15 lbs last 5 months-unintentionally -scared to eat from the pain  NSAID use: none  PPI use :Omeprazole  Gall bladder surgery: no Frequency of bowel movements: everytime she eats-ongoing for the past few years and worse last 2-3 years Change in bowel movements: last 2-3 years  Relief with bowel movements: yes  Gas/Bloating/Abdominal distension: yes , abdominal bloating ,     Her sister , mother both had gall stones.   Past Medical History:  Diagnosis Date  . Abnormal Pap smear of vagina and vaginal HPV   . Arthritis    "bones ache" (01/23/2015)  . Cancer ([place])    vulvar cancer  . CHF (congestive heart failure) ([place])   . Chronic anticoagulation    She is on Xarelto for Afib  . Chronic atrial fibrillation ([place])   . Echocardiogram abnormal    Mitral regurgitation, tricuspic regurgitation  . GERD (gastroesophageal reflux disease)   . History of stress test    Myoview 8/16: EF 42%, anteroseptal, inferoseptal, anterior, apical anterior and apical defect (likely represents breast attenuation versus scar), no ischemia; intermediate risk because of low EF  . HPV (human papilloma virus) anogenital infection    03/21/12 PAP + HR HPV  . Hypertension   . Insulin resistance   . Kidney stones   .  Obesity   . OSA on CPAP   . Postoperative nausea and vomiting   . Urolithiasis   . Vulvar cancer ([place]) 2016   invasive verrucous carcinoma of the vulva. History of genital warts and vulvar condyloma    Past Surgical History:  Procedure Laterality Date  . ABDOMINAL HYSTERECTOMY    . APPENDECTOMY    . BILATERAL SALPINGOOPHORECTOMY  2003   benign ovarian cancer  . CARDIOVERSION  2014-2015   "[place]"  . COLONOSCOPY WITH PROPOFOL N/A 06/10/2015   Procedure: COLONOSCOPY WITH PROPOFOL;  Surgeon: Lucilla Lame, MD;  Location: [facility] ENDOSCOPY;  Service: Endoscopy;  Laterality: N/A;  . LITHOTRIPSY    . TONSILLECTOMY    . VULVECTOMY Right 05/10/2014   Excisional biopsy of the superior right labial majora mass; [place] PARTIAL  07/02/2014   Re-excision and sentinel node dissection at [facility].     Prior to Admission medications   Medication Sig Start Date End Date Taking? Authorizing Provider  albuterol (PROVENTIL HFA;VENTOLIN HFA) 108 (90 Base) MCG/ACT inhaler Inhale 2 puffs into the lungs every 8 (eight) hours as needed for wheezing or shortness of breath. 04/29/16   Juline Patch, MD  doxycycline (VIBRA-TABS) 100 MG tablet Take 1 tablet (100 mg total) by mouth 2 (two) times daily. 09/03/16   Juline Patch, MD  furosemide (LASIX) 40 MG tablet TAKE 2 TABLETS (80 MG TOTAL) BY MOUTH DAILY. 12/01/15   Jettie Booze, MD  irbesartan (AVAPRO) 75 MG tablet Take 1 tablet (75 mg total) by mouth daily. 11/17/15   Jettie Booze, MD  metoprolol (LOPRESSOR) 100 MG tablet Take 1 tablet (100 mg total) by mouth 2 (two) times daily. 07/22/16 10/20/16  Allred, Jeneen Rinks, MD  metoprolol succinate (TOPROL-XL) 50 MG 24 hr tablet Take one tablet by mouth twice a day along with 100mg  tablet to equal 150 mg. Take with or immediately following a meal. 07/20/16   Jettie Booze, MD  omeprazole (PRILOSEC) 20 MG capsule TAKE 1 CAPSULE (20 MG TOTAL) BY MOUTH ONCE DAILY. 11/17/15    Jettie Booze, MD  sertraline (ZOLOFT) 25 MG tablet Take 1 tablet (25 mg total) by mouth daily. 04/15/16   Juline Patch, MD  vitamin B-12 (CYANOCOBALAMIN) 1000 MCG tablet Take 1,000 mcg by mouth daily.    [provider]  XARELTO 20 MG TABS tablet TAKE 1 TABLET (20 MG TOTAL) BY MOUTH DAILY WITH SUPPER. (COPAY IS $215.00 WITH INSURANCE) 12/17/15   Jettie Booze, MD    Family History  Problem Relation Age of Onset  . Prostate cancer Brother 9       still living and well  . Heart attack Father   . Stroke Mother   . Diabetes Mother   . Hypertension Mother   . Heart attack Brother   . Pancreatic cancer Brother   . Healthy Sister   . Breast cancer Neg Hx   . Kidney cancer Neg Hx   . Bladder Cancer Neg Hx      Social History  Substance Use Topics  . Smoking status: Former Smoker    Packs/day: 1.00    Years: 50.00    Types: Cigarettes, E-cigarettes    Quit date: 05/20/2012  . Smokeless tobacco: Never Used  . Alcohol use 0.0 oz/week     Comment: 01/23/2015 "I' used to drink socially a few times/month"    Allergies as of 09/07/2016 - Review Complete 09/02/2016  Allergen Reaction Noted  . Ciprofloxacin Other (See Comments) 02/04/2015    Review of Systems:    All systems reviewed and negative except where noted in HPI.   Physical Exam:  There were no vitals taken for this visit. No LMP recorded. Patient has had a hysterectomy. Psych:  Alert and cooperative. Normal mood and affect. General:   Alert,  Well-developed, well-nourished, pleasant and cooperative in NAD Head:  Normocephalic and atraumatic. Eyes:  Sclera clear, no icterus.   Conjunctiva pink. Ears:  Normal auditory acuity. Nose:  No deformity, discharge, or lesions. Mouth:  No deformity or lesions,oropharynx pink & moist. Neck:  Supple; no masses or thyromegaly. Lungs:  Respirations even and unlabored.  Clear throughout to auscultation.   No wheezes, crackles, or rhonchi. No acute  distress. Heart:  Regular rate and rhythm; no murmurs, clicks, rubs, or gallops. Abdomen:  Normal bowel sounds.  No bruits.  Soft,  and non-distended without masses, hepatosplenomegaly or hernias noted.  No guarding or rebound tenderness.   RUQ mild tenderness  Msk:  Symmetrical without gross deformities. Good, equal movement & strength bilaterally. Neurologic:  Alert and oriented x3;  grossly normal neurologically. Skin:  Intact without significant lesions or rashes. No jaundice. Lymph Nodes:  No significant cervical adenopathy. Psych:  Alert and cooperative. Normal mood and affect.  Imaging Studies: No results found.  Assessment and Plan:   Holly Smith is a 71 y.o. y/o female has been referred for abdominal pain , post prandial diarrhea and relief  of pain very suggestive of IBS-D. She also has RUQ pain after meals which can be related to biliary colic.Some weight loss which she attributes to fear of eating   Plan  1. EGD to evaluate for dyspesia 2. RUQ USG to r/o gall stones 3. CBC,CMP,Stool H pylori antigen , celiac serology  4. IF EGD is negative then will need CT abdomen  5. Trial of Bentyl   I have discussed alternative options, risks & benefits,  which include, but are not limited to, bleeding, infection, perforation,respiratory complication & drug reaction.  The patient agrees with this plan & written consent will be obtained.    Follow up in 6-8 weeks   Dr Jonathon Bellows MD,MRCP(U.K)

## 2016-09-08 ENCOUNTER — Telehealth: Payer: Self-pay

## 2016-09-08 ENCOUNTER — Telehealth: Payer: Self-pay | Admitting: Gastroenterology

## 2016-09-08 NOTE — Telephone Encounter (Signed)
LVM for patient callback for results per Dr. Vicente Males.   Potassium a bit low- drink some orange juice - if having any diarrhea to let us know. LFT's,CBC normal

## 2016-09-08 NOTE — Telephone Encounter (Signed)
Pt takes Xarelto for afib with CHADS2 score of 3 (HTN, CHF, ?DM - insulin resistance and A1c diagnostic of DM but not on meds) and CHADS2VASc of 5 (age, sex, HTN, CHF, DM). Ok to hold Xarelto for 24 hours prior to EGD per protocol. Clearance faxed to [phone].

## 2016-09-08 NOTE — Telephone Encounter (Signed)
Request for clearance:  1. What type of procedure is being performed? EGD   2. When is this procedure scheduled? 10/05/16   3. Are there any medications that need to be held prior to procedure and how long? Xarelto   4. Name of physician performing procedure? Dr. Vicente Males   5. What is your office phone and fax number? P- [phone], [phone]

## 2016-09-08 NOTE — Telephone Encounter (Signed)
-----   Message from Jonathon Bellows, MD sent at 09/08/2016 12:35 PM EDT ----- Potassium a bit low- drink some orange juice - if having any diarrhea to let us know. LFT's,CBC normal

## 2016-09-08 NOTE — Telephone Encounter (Signed)
09/08/16 [employer]HC website NO prior auth required for EGD 43235 / R10.00, R63.4  Decision ID#: [id number].

## 2016-09-09 LAB — CELIAC DISEASE PANEL
ENDOMYSIAL ANTIBODY IGA: NEGATIVE
IGA: 388 mg/dL (ref 64–422)
Tissue Transglutaminase Ab, IgA: <2 U/mL (ref 0–3)

## 2016-09-10 ENCOUNTER — Telehealth: Payer: Self-pay

## 2016-09-10 ENCOUNTER — Other Ambulatory Visit
Admission: RE | Admit: 2016-09-10 | Discharge: 2016-09-10 | Disposition: A | Discharge status: Home / Self Care | Payer: Medicare Other | Source: Ambulatory Visit | Attending: Gastroenterology | Admitting: Gastroenterology

## 2016-09-10 DIAGNOSIS — R1013 Epigastric pain: Secondary | ICD-10-CM | POA: Insufficient documentation

## 2016-09-10 NOTE — Telephone Encounter (Signed)
-----   Message from Jonathon Bellows, MD sent at 09/08/2016 12:35 PM EDT ----- Potassium a bit low- drink some orange juice - if having any diarrhea to let us know. LFT's,CBC normal

## 2016-09-10 NOTE — Telephone Encounter (Signed)
Advised patient of results per Dr. Vicente Males.   Potassium a bit low- drink some orange juice - if having any diarrhea to let us know. LFT's,CBC normal

## 2016-09-10 NOTE — Telephone Encounter (Signed)
Pt received clearance from Dr. Irish Lack for 24 hours prior to procedure stop Xarelto. Resume following day after procedure per protocol.   See media tab for scanned clearance letter.  Encouraged patient to add fiber to her diet to aid in constipation.

## 2016-09-13 LAB — H. PYLORI ANTIGEN, STOOL: H. PYLORI STOOL AG, EIA: NEGATIVE

## 2016-09-14 ENCOUNTER — Ambulatory Visit
Admission: RE | Admit: 2016-09-14 | Discharge: 2016-09-14 | Disposition: A | Discharge status: Home / Self Care | Payer: Medicare Other | Source: Ambulatory Visit | Attending: Gastroenterology | Admitting: Gastroenterology

## 2016-09-14 DIAGNOSIS — K824 Cholesterolosis of gallbladder: Secondary | ICD-10-CM | POA: Diagnosis not present

## 2016-09-14 DIAGNOSIS — H16202 Unspecified keratoconjunctivitis, left eye: Secondary | ICD-10-CM | POA: Diagnosis not present

## 2016-09-14 DIAGNOSIS — R1011 Right upper quadrant pain: Secondary | ICD-10-CM | POA: Diagnosis not present

## 2016-09-21 ENCOUNTER — Telehealth: Payer: Self-pay

## 2016-09-21 NOTE — Telephone Encounter (Signed)
-----   Message from Jonathon Bellows, MD sent at 09/19/2016 12:46 PM EDT ----- Limited view due to body habitus- was normal in what they could see , can discuss MRI at next visit

## 2016-09-21 NOTE — Telephone Encounter (Signed)
Advised patient of results per Dr. Vicente Males.   H pylori stool antigen negative. Limited view due to body habitus- was normal in what they could see , can discuss MRI at next visit

## 2016-09-27 ENCOUNTER — Other Ambulatory Visit: Payer: Self-pay | Admitting: Gastroenterology

## 2016-09-27 DIAGNOSIS — R1013 Epigastric pain: Secondary | ICD-10-CM

## 2016-10-04 ENCOUNTER — Encounter: Payer: Self-pay | Admitting: *Deleted

## 2016-10-05 ENCOUNTER — Ambulatory Visit: Payer: Medicare Other | Admitting: Registered Nurse

## 2016-10-05 ENCOUNTER — Ambulatory Visit
Admission: RE | Admit: 2016-10-05 | Discharge: 2016-10-05 | Disposition: A | Discharge status: Home / Self Care | Payer: Medicare Other | Source: Ambulatory Visit | Attending: Gastroenterology | Admitting: Gastroenterology

## 2016-10-05 ENCOUNTER — Encounter
Admission: RE | Disposition: A | Discharge status: Home / Self Care | Payer: Self-pay | Source: Ambulatory Visit | Attending: Gastroenterology

## 2016-10-05 ENCOUNTER — Encounter: Payer: Self-pay | Admitting: *Deleted

## 2016-10-05 DIAGNOSIS — I10 Essential (primary) hypertension: Secondary | ICD-10-CM | POA: Diagnosis not present

## 2016-10-05 DIAGNOSIS — K449 Diaphragmatic hernia without obstruction or gangrene: Secondary | ICD-10-CM | POA: Diagnosis not present

## 2016-10-05 DIAGNOSIS — I509 Heart failure, unspecified: Secondary | ICD-10-CM | POA: Insufficient documentation

## 2016-10-05 DIAGNOSIS — K2970 Gastritis, unspecified, without bleeding: Secondary | ICD-10-CM

## 2016-10-05 DIAGNOSIS — K2270 Barrett's esophagus without dysplasia: Secondary | ICD-10-CM | POA: Diagnosis not present

## 2016-10-05 DIAGNOSIS — K228 Other specified diseases of esophagus: Secondary | ICD-10-CM

## 2016-10-05 DIAGNOSIS — Z87891 Personal history of nicotine dependence: Secondary | ICD-10-CM | POA: Insufficient documentation

## 2016-10-05 DIAGNOSIS — I482 Chronic atrial fibrillation: Secondary | ICD-10-CM | POA: Insufficient documentation

## 2016-10-05 DIAGNOSIS — G4733 Obstructive sleep apnea (adult) (pediatric): Secondary | ICD-10-CM | POA: Insufficient documentation

## 2016-10-05 DIAGNOSIS — Z8042 Family history of malignant neoplasm of prostate: Secondary | ICD-10-CM | POA: Insufficient documentation

## 2016-10-05 DIAGNOSIS — K210 Gastro-esophageal reflux disease with esophagitis: Secondary | ICD-10-CM | POA: Insufficient documentation

## 2016-10-05 DIAGNOSIS — K319 Disease of stomach and duodenum, unspecified: Secondary | ICD-10-CM | POA: Diagnosis not present

## 2016-10-05 DIAGNOSIS — I110 Hypertensive heart disease with heart failure: Secondary | ICD-10-CM | POA: Insufficient documentation

## 2016-10-05 DIAGNOSIS — Z7901 Long term (current) use of anticoagulants: Secondary | ICD-10-CM | POA: Diagnosis not present

## 2016-10-05 DIAGNOSIS — K209 Esophagitis, unspecified: Secondary | ICD-10-CM | POA: Diagnosis not present

## 2016-10-05 DIAGNOSIS — Z803 Family history of malignant neoplasm of breast: Secondary | ICD-10-CM | POA: Insufficient documentation

## 2016-10-05 DIAGNOSIS — R1011 Right upper quadrant pain: Secondary | ICD-10-CM

## 2016-10-05 DIAGNOSIS — M1990 Unspecified osteoarthritis, unspecified site: Secondary | ICD-10-CM | POA: Insufficient documentation

## 2016-10-05 DIAGNOSIS — Z8052 Family history of malignant neoplasm of bladder: Secondary | ICD-10-CM | POA: Insufficient documentation

## 2016-10-05 DIAGNOSIS — Z90710 Acquired absence of both cervix and uterus: Secondary | ICD-10-CM | POA: Diagnosis not present

## 2016-10-05 DIAGNOSIS — Z8544 Personal history of malignant neoplasm of other female genital organs: Secondary | ICD-10-CM | POA: Diagnosis not present

## 2016-10-05 DIAGNOSIS — R1013 Epigastric pain: Secondary | ICD-10-CM | POA: Diagnosis not present

## 2016-10-05 DIAGNOSIS — Z881 Allergy status to other antibiotic agents status: Secondary | ICD-10-CM | POA: Diagnosis not present

## 2016-10-05 DIAGNOSIS — Z6841 Body Mass Index (BMI) 40.0 and over, adult: Secondary | ICD-10-CM | POA: Diagnosis not present

## 2016-10-05 DIAGNOSIS — K2950 Unspecified chronic gastritis without bleeding: Secondary | ICD-10-CM | POA: Diagnosis not present

## 2016-10-05 DIAGNOSIS — E669 Obesity, unspecified: Secondary | ICD-10-CM | POA: Insufficient documentation

## 2016-10-05 DIAGNOSIS — K2289 Other specified disease of esophagus: Secondary | ICD-10-CM

## 2016-10-05 HISTORY — PX: ESOPHAGOGASTRODUODENOSCOPY (EGD) WITH PROPOFOL: SHX5813

## 2016-10-05 HISTORY — DX: Dyspnea, unspecified: R06.00

## 2016-10-05 HISTORY — DX: Unspecified atrial fibrillation: I48.91

## 2016-10-05 SURGERY — ESOPHAGOGASTRODUODENOSCOPY (EGD) WITH PROPOFOL
Anesthesia: General

## 2016-10-05 MED ORDER — MIDAZOLAM HCL 2 MG/2ML IJ SOLN
INTRAMUSCULAR | Status: DC | PRN
  Administered 2016-10-05 (×2): 1 mg via INTRAVENOUS

## 2016-10-05 MED ORDER — GLYCOPYRROLATE 0.2 MG/ML IJ SOLN
INTRAMUSCULAR | Status: DC | PRN
  Administered 2016-10-05: 0.2 mg via INTRAVENOUS

## 2016-10-05 MED ORDER — SODIUM CHLORIDE 0.9 % IV SOLN
INTRAVENOUS | Status: DC
  Administered 2016-10-05: 1000 mL via INTRAVENOUS

## 2016-10-05 MED ORDER — LIDOCAINE HCL (PF) 2 % IJ SOLN
INTRAMUSCULAR | Status: AC
  Filled 2016-10-05: qty 2

## 2016-10-05 MED ORDER — LIDOCAINE HCL (CARDIAC) 20 MG/ML IV SOLN
INTRAVENOUS | Status: DC | PRN
  Administered 2016-10-05: 40 mg via INTRAVENOUS

## 2016-10-05 MED ORDER — PROPOFOL 10 MG/ML IV BOLUS
INTRAVENOUS | Status: DC | PRN
  Administered 2016-10-05: 70 mg via INTRAVENOUS

## 2016-10-05 MED ORDER — GLYCOPYRROLATE 0.2 MG/ML IJ SOLN
INTRAMUSCULAR | Status: AC
  Filled 2016-10-05: qty 2

## 2016-10-05 MED ORDER — PROPOFOL 10 MG/ML IV BOLUS
INTRAVENOUS | Status: AC
  Filled 2016-10-05: qty 40

## 2016-10-05 MED ORDER — SUCCINYLCHOLINE CHLORIDE 20 MG/ML IJ SOLN
INTRAMUSCULAR | Status: AC
  Filled 2016-10-05: qty 1

## 2016-10-05 MED ORDER — MIDAZOLAM HCL 2 MG/2ML IJ SOLN
INTRAMUSCULAR | Status: AC
  Filled 2016-10-05: qty 2

## 2016-10-05 MED ORDER — PROPOFOL 500 MG/50ML IV EMUL
INTRAVENOUS | Status: DC | PRN
  Administered 2016-10-05: 150 ug/kg/min via INTRAVENOUS

## 2016-10-05 NOTE — Anesthesia Preprocedure Evaluation (Addendum)
Anesthesia Evaluation  Patient identified by MRN, date of birth, ID band Patient awake    Reviewed: Allergy & Precautions, H&P , NPO status , Patient's Chart, lab work & pertinent test results, reviewed documented beta blocker date and time   History of Anesthesia Complications (+) PONV and history of anesthetic complications  Airway Mallampati: I  TM Distance: >3 FB Neck ROM: full    Dental  (+) Teeth Intact   Pulmonary shortness of breath and with exertion, sleep apnea , neg COPD, neg recent URI, former smoker,           Cardiovascular Exercise Tolerance: Good hypertension, (-) angina+CHF  (-) CAD, (-) Past MI, (-) Cardiac Stents and (-) CABG + dysrhythmias Atrial Fibrillation + Valvular Problems/Murmurs MR      Neuro/Psych negative neurological ROS  negative psych ROS   GI/Hepatic Neg liver ROS, GERD  ,  Endo/Other  neg diabetesMorbid obesity  Renal/GU Renal disease (kidney stones)  negative genitourinary   Musculoskeletal   Abdominal   Peds  Hematology negative hematology ROS (+)   Anesthesia Other Findings Past Medical History: No date: A-fib ([facility]) No date: Abnormal Pap smear of vagina and vaginal HPV No date: Arthritis     Comment:  "bones ache" (01/23/2015) No date: Cancer ([place])     Comment:  vulvar cancer No date: CHF (congestive heart failure) ([facility]) No date: Chronic anticoagulation     Comment:  She is on Xarelto for Afib No date: Chronic atrial fibrillation ([facility]) No date: Dyspnea No date: Echocardiogram abnormal     Comment:  Mitral regurgitation, tricuspic regurgitation No date: GERD (gastroesophageal reflux disease) No date: History of stress test     Comment:  Myoview 8/16: EF 42%, anteroseptal, inferoseptal,               anterior, apical anterior and apical defect (likely               represents breast attenuation versus scar), no ischemia;               intermediate risk because of low  EF No date: HPV (human papilloma virus) anogenital infection     Comment:  03/21/12 PAP + HR HPV No date: Hypertension No date: Insulin resistance No date: Kidney stones No date: Obesity No date: OSA on CPAP No date: Postoperative nausea and vomiting No date: Urolithiasis 2016: Vulvar cancer ([place])     Comment:  invasive verrucous carcinoma of the vulva. History of               genital warts and vulvar condyloma   Reproductive/Obstetrics negative OB ROS                           Anesthesia Physical Anesthesia Plan  ASA: III  Anesthesia Plan: General   Post-op Pain Management:    Induction: Intravenous  PONV Risk Score and Plan: 4 or greater and Propofol and Treatment may vary due to age or medical condition  Airway Management Planned: Natural Airway and Nasal Cannula  Additional Equipment:   Intra-op Plan:   Post-operative Plan:   Informed Consent: I have reviewed the patients History and Physical, chart, labs and discussed the procedure including the risks, benefits and alternatives for the proposed anesthesia with the patient or authorized representative who has indicated his/her understanding and acceptance.   Dental Advisory Given  Plan Discussed with: Anesthesiologist, CRNA and Surgeon  Anesthesia Plan Comments:  Anesthesia Quick Evaluation  

## 2016-10-05 NOTE — Anesthesia Post-op Follow-up Note (Cosign Needed)
Anesthesia QCDR form completed.        

## 2016-10-05 NOTE — Anesthesia Postprocedure Evaluation (Signed)
Anesthesia Post Note  Patient: Holly Smith  Procedure(s) Performed: Procedure(s) (LRB): ESOPHAGOGASTRODUODENOSCOPY (EGD) WITH PROPOFOL (N/A)  Patient location during evaluation: Endoscopy Anesthesia Type: General Level of consciousness: awake and alert Pain management: pain level controlled Vital Signs Assessment: post-procedure vital signs reviewed and stable Respiratory status: spontaneous breathing, nonlabored ventilation, respiratory function stable and patient connected to nasal cannula oxygen Cardiovascular status: blood pressure returned to baseline and stable Postop Assessment: no signs of nausea or vomiting Anesthetic complications: no     Last Vitals:  Vitals:   10/05/16 0903 10/05/16 0913  BP: 127/81 132/81  Pulse: 80 85  Resp: (!) 22 (!) 25  Temp:      Last Pain:  Vitals:   10/05/16 0843  TempSrc: Tympanic                 Martha Clan

## 2016-10-05 NOTE — Op Note (Signed)
[facility] Patient Name: Holly Smith Procedure Date: 10/05/2016 8:14 AM MRN: [id number] Account #: [id number] Date of Birth: 28-Feb-1946 Admit Type: Outpatient Age: 71 Room: [facility] ENDO ROOM 4 Gender: Female Note Status: Finalized Procedure:            Upper GI endoscopy Indications:          Dyspepsia Providers:            Lucilla Lame MD, MD Referring MD:         Juline Patch, MD (Referring MD) Medicines:            Propofol per Anesthesia Complications:        No immediate complications. Procedure:            Pre-Anesthesia Assessment:                       - Prior to the procedure, a History and Physical was                        performed, and patient medications and allergies were                        reviewed. The patient's tolerance of previous                        anesthesia was also reviewed. The risks and benefits of                        the procedure and the sedation options and risks were                        discussed with the patient. All questions were                        answered, and informed consent was obtained. Prior                        Anticoagulants: The patient has taken no previous                        anticoagulant or antiplatelet agents. ASA Grade                        Assessment: II - A patient with mild systemic disease.                        After reviewing the risks and benefits, the patient was                        deemed in satisfactory condition to undergo the                        procedure.                       After obtaining informed consent, the endoscope was                        passed under direct vision. Throughout the procedure,  the patient's blood pressure, pulse, and oxygen                        saturations were monitored continuously. The Endoscope                        was introduced through the mouth, and advanced to the                        second  part of duodenum. The upper GI endoscopy was                        accomplished without difficulty. The patient tolerated                        the procedure well. Findings:      There were esophageal mucosal changes suspicious for long-segment       Barrett's esophagus present in the lower third of the esophagus. The       maximum longitudinal extent of these mucosal changes was 6 cm in length.       Mucosa was biopsied with a cold forceps for histology in 3 sections at       intervals of 1 cm from 28 to 33 cm from the incisors.      Localized mild inflammation characterized by erythema was found in the       gastric antrum. Biopsies were taken with a cold forceps for histology.      The examined duodenum was normal. Impression:           - Esophageal mucosal changes suspicious for                        long-segment Barrett's esophagus. Biopsied.                       - Gastritis. Biopsied.                       - Normal examined duodenum. Recommendation:       - Discharge patient to home.                       - Resume previous diet.                       - Continue present medications.                       - Await pathology results. Procedure Code(s):    --- Professional ---                       [phone], Esophagogastroduodenoscopy, flexible, transoral;                        with biopsy, single or multiple Diagnosis Code(s):    --- Professional ---                       R10.13, Epigastric pain                       K29.70, Gastritis, unspecified, without bleeding  K22.8, Other specified diseases of esophagus CPT copyright 2016 American Medical Association. All rights reserved. The codes documented in this report are preliminary and upon coder review may  be revised to meet current compliance requirements. Lucilla Lame MD, MD 10/05/2016 8:40:33 AM This report has been signed electronically. Number of Addenda: 0 Note Initiated On: 10/05/2016 8:14 AM       [facility]

## 2016-10-05 NOTE — Transfer of Care (Signed)
Immediate Anesthesia Transfer of Care Note  Patient: Holly Smith  Procedure(s) Performed: Procedure(s): ESOPHAGOGASTRODUODENOSCOPY (EGD) WITH PROPOFOL (N/A)  Patient Location: PACU and Endoscopy Unit  Anesthesia Type:General  Level of Consciousness: sedated  Airway & Oxygen Therapy: Patient Spontanous Breathing and Patient connected to nasal cannula oxygen  Post-op Assessment: Report given to RN and Post -op Vital signs reviewed and stable  Post vital signs: Reviewed and stable  Last Vitals:  Vitals:   10/05/16 0749 10/05/16 0843  BP: (!) 168/97 105/64  Pulse:  87  Resp:  (!) 24  Temp:  29.5 C    Complications: No apparent anesthesia complications

## 2016-10-05 NOTE — H&P (Signed)
Holly Lame, MD [address] [place], [place] [address] Phone:[phone] Fax : [phone]  Primary Care Physician:  Juline Patch, MD Primary Gastroenterologist:  Dr. Allen Norris  Pre-Procedure History & Physical: HPI:  Holly Smith is a 71 y.o. female is here for an endoscopy.   Past Medical History:  Diagnosis Date  . A-fib ([place])   . Abnormal Pap smear of vagina and vaginal HPV   . Arthritis    "bones ache" (01/23/2015)  . Cancer ([place])    vulvar cancer  . CHF (congestive heart failure) ([place])   . Chronic anticoagulation    She is on Xarelto for Afib  . Chronic atrial fibrillation ([place])   . Dyspnea   . Echocardiogram abnormal    Mitral regurgitation, tricuspic regurgitation  . GERD (gastroesophageal reflux disease)   . History of stress test    Myoview 8/16: EF 42%, anteroseptal, inferoseptal, anterior, apical anterior and apical defect (likely represents breast attenuation versus scar), no ischemia; intermediate risk because of low EF  . HPV (human papilloma virus) anogenital infection    03/21/12 PAP + HR HPV  . Hypertension   . Insulin resistance   . Kidney stones   . Obesity   . OSA on CPAP   . Postoperative nausea and vomiting   . Urolithiasis   . Vulvar cancer ([place]) 2016   invasive verrucous carcinoma of the vulva. History of genital warts and vulvar condyloma    Past Surgical History:  Procedure Laterality Date  . ABDOMINAL HYSTERECTOMY    . APPENDECTOMY    . BILATERAL SALPINGOOPHORECTOMY  2003   benign ovarian cancer  . CARDIOVERSION  2014-2015   "[place]"  . COLONOSCOPY WITH PROPOFOL N/A 06/10/2015   Procedure: COLONOSCOPY WITH PROPOFOL;  Surgeon: Holly Lame, MD;  Location: [facility] ENDOSCOPY;  Service: Endoscopy;  Laterality: N/A;  . LITHOTRIPSY    . TONSILLECTOMY    . VULVECTOMY Right 05/10/2014   Excisional biopsy of the superior right labial majora mass; [place] PARTIAL  07/02/2014   Re-excision and sent[facility] node [facility] at [facility].     Prior to Admission medications   Medication Sig Start Date End Date Taking? Authorizing Provider  metoprolol (LOPRESSOR) 100 MG tablet Take 1 tablet (100 mg total) by mouth 2 (two) times daily. 07/22/16 10/20/16 Yes Allred, Jeneen Rinks, MD  metoprolol succinate (TOPROL-XL) 50 MG 24 hr tablet Take one tablet by mouth twice a day along with 100mg  tablet to equal 150 mg. Take with or immediately following a meal. 07/20/16  Yes Jettie Booze, MD  albuterol (PROVENTIL HFA;VENTOLIN HFA) 108 (90 Base) MCG/ACT inhaler Inhale 2 puffs into the lungs every 8 (eight) hours as needed for wheezing or shortness of breath. 04/29/16   Juline Patch, MD  dicyclomine (BENTYL) 10 MG capsule TAKE 1 CAPSULE (10 MG TOTAL) BY MOUTH 4 (FOUR) TIMES DAILY - BEFORE MEALS AND AT BEDTIME. 09/27/16   Jonathon Bellows, MD  doxycycline (VIBRA-TABS) 100 MG tablet Take 1 tablet (100 mg total) by mouth 2 (two) times daily. 09/03/16   Juline Patch, MD  furosemide (LASIX) 40 MG tablet TAKE 2 TABLETS (80 MG TOTAL) BY MOUTH DAILY. 12/01/15   Jettie Booze, MD  irbesartan (AVAPRO) 75 MG tablet Take 1 tablet (75 mg total) by mouth daily. 11/17/15   Jettie Booze, MD  omeprazole (PRILOSEC) 20 MG capsule TAKE 1 CAPSULE (20 MG TOTAL) BY MOUTH ONCE DAILY. 11/17/15   Jettie Booze,  MD  sertraline (ZOLOFT) 25 MG tablet Take 1 tablet (25 mg total) by mouth daily. 04/15/16   Juline Patch, MD  vitamin B-12 (CYANOCOBALAMIN) 1000 MCG tablet Take 1,000 mcg by mouth daily.    [provider]  XARELTO 20 MG TABS tablet TAKE 1 TABLET (20 MG TOTAL) BY MOUTH DAILY WITH SUPPER. (COPAY IS $215.00 WITH INSURANCE) 12/17/15   Jettie Booze, MD    Allergies as of 09/07/2016 - Review Complete 09/02/2016  Allergen Reaction Noted  . Ciprofloxacin Other (See Comments) 02/04/2015    Family History  Problem Relation Age of Onset  . Prostate cancer Brother 33       still living and  well  . Heart attack Father   . Stroke Mother   . Diabetes Mother   . Hypertension Mother   . Heart attack Brother   . Pancreatic cancer Brother   . Healthy Sister   . Breast cancer Neg Hx   . Kidney cancer Neg Hx   . Bladder Cancer Neg Hx     Social History   Social History  . Marital status: Divorced    Spouse name: N/A  . Number of children: N/A  . Years of education: N/A   Occupational History  . Not on file.   Social History Main Topics  . Smoking status: Former Smoker    Packs/day: 1.00    Years: 50.00    Types: Cigarettes, E-cigarettes    Quit date: 05/20/2012  . Smokeless tobacco: Never Used  . Alcohol use 0.0 oz/week     Comment: 01/23/2015 "I' used to drink socially a few times/month"  . Drug use: No  . Sexual activity: Not Currently   Other Topics Concern  . Not on file   Social History Narrative  . No narrative on file    Review of Systems: See HPI, otherwise negative ROS  Physical Exam: BP (!) 168/97   Pulse 75   Temp 98.1 F (36.7 C) (Tympanic)   Resp 18   Ht 5' (1.524 m)   Wt 213 lb (96.6 kg)   SpO2 95%   BMI 41.60 kg/m  General:   Alert,  pleasant and cooperative in NAD Head:  Normocephalic and atraumatic. Neck:  Supple; no masses or thyromegaly. Lungs:  Clear throughout to auscultation.    Heart:  Regular rate and rhythm. Abdomen:  Soft, nontender and nondistended. Normal bowel sounds, without guarding, and without rebound.   Neurologic:  Alert and  oriented x4;  grossly normal neurologically.  Impression/Plan: Holly Smith is here for an endoscopy to be performed for dyspepsia  Risks, benefits, limitations, and alternatives regarding  endoscopy have been reviewed with the patient.  Questions have been answered.  All parties agreeable.   Holly Lame, MD  10/05/2016, 8:00 AM

## 2016-10-06 ENCOUNTER — Ambulatory Visit: Payer: Medicare Other | Admitting: Urology

## 2016-10-06 ENCOUNTER — Encounter: Payer: Self-pay | Admitting: Gastroenterology

## 2016-10-06 VITALS — BP 113/63 | HR 87 | Ht 60.0 in | Wt 215.0 lb

## 2016-10-06 DIAGNOSIS — N3281 Overactive bladder: Secondary | ICD-10-CM | POA: Diagnosis not present

## 2016-10-06 DIAGNOSIS — N898 Other specified noninflammatory disorders of vagina: Secondary | ICD-10-CM

## 2016-10-06 DIAGNOSIS — N329 Bladder disorder, unspecified: Secondary | ICD-10-CM

## 2016-10-06 LAB — URINALYSIS, COMPLETE
Bilirubin, UA: NEGATIVE
Glucose, UA: NEGATIVE
Ketones, UA: NEGATIVE
NITRITE UA: NEGATIVE
Protein, UA: NEGATIVE
Specific Gravity, UA: 1.015 (ref 1.005–1.030)
UUROB: 0.2 mg/dL (ref 0.2–1.0)
pH, UA: 7 (ref 5.0–7.5)

## 2016-10-06 LAB — MICROSCOPIC EXAMINATION

## 2016-10-06 LAB — BLADDER SCAN AMB NON-IMAGING

## 2016-10-06 MED ORDER — LIDOCAINE HCL 2 % EX GEL
1.0000 "application " | Freq: Once | CUTANEOUS | Status: AC
  Administered 2016-10-06: 1 via URETHRAL

## 2016-10-06 MED ORDER — SULFAMETHOXAZOLE-TRIMETHOPRIM 800-160 MG PO TABS
1.0000 | ORAL_TABLET | Freq: Two times a day (BID) | ORAL | Status: DC
Start: 2016-10-06 — End: 2016-10-06

## 2016-10-06 MED ORDER — SULFAMETHOXAZOLE-TRIMETHOPRIM 800-160 MG PO TABS
1.0000 | ORAL_TABLET | Freq: Once | ORAL | Status: AC
  Administered 2016-10-06: 1 via ORAL

## 2016-10-06 NOTE — Progress Notes (Signed)
   10/06/16  CC:  Chief Complaint  Patient presents with  . 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  On follow up with her GYN oncologist, Dr. Isaiah Blakes, she is noted to have been in usual urethral caruncle. This was somewhat hypopigmented. She returned today for cystoscopy for further evaluation to evaluate the urethra.  In addition, she has stress urinary incontinence as well as overactivity. She was given 3 weeks of Mybetriq 25 mg at her last appointment.  She is not taking this medication as she is worried that she will be able to urinate.  Results for orders placed or performed in visit on 10/06/16  BLADDER SCAN AMB NON-IMAGING  Result Value Ref Range   Scan Result 29ml     Blood pressure 113/63, pulse 87, height 5' (1.524 m), weight 215 lb (97.5 kg). NED. A&Ox3.   No respiratory distress   Abd soft, NT, ND Normal external genitalia with patent urethral meatus  Cystoscopy Procedure Note  Patient identification was confirmed, informed consent was obtained, and patient was prepped using Betadine solution.  Lidocaine jelly was administered per urethral meatus.  Of note on examination, she did have a small urethral caruncle.  Additionally, she has a whitish plaque-like appearance on her anterior vaginal wall.  Preoperative abx where received prior to procedure.    Procedure: - Flexible cystoscope introduced, without any difficulty.   - Thorough search of the bladder revealed:    Multiple small punctate lesions throughout the bladder, greatest on left lateral wall of the bladder along with confluences of patchy erythema    no stones    no ulcers     no tumors    no urethral polyps    no trabeculation  - Ureteral orifices were normal in position and appearance.  Changes consistent with squamous metaplasia appreciated bladder neck.  Post-Procedure: - Patient tolerated the  procedure well  Assessment/ Plan:  1. Bladder lesions-most likely cystitis cystica but not pathognomonic for this. I highly recommended bladder biopsy of these lesions. Risks and benefits were discussed in detail including risk of bleeding, infection, damage to surrounding structures. Plan for concomitant retrograde pyelogram further workup. All questions were answered.   2. Vaginal lesion-white plaque-like material on anterior vaginal wall. Since we are going to the operating room to biopsy of bladder, we'll proceed with vaginal biopsies well. She is agreeable with this plan.   Hollice Espy, MD

## 2016-10-07 ENCOUNTER — Telehealth: Payer: Self-pay

## 2016-10-07 ENCOUNTER — Other Ambulatory Visit: Payer: Self-pay | Admitting: Radiology

## 2016-10-07 ENCOUNTER — Telehealth: Payer: Self-pay | Admitting: Radiology

## 2016-10-07 DIAGNOSIS — N898 Other specified noninflammatory disorders of vagina: Secondary | ICD-10-CM

## 2016-10-07 DIAGNOSIS — N329 Bladder disorder, unspecified: Secondary | ICD-10-CM

## 2016-10-07 LAB — SURGICAL PATHOLOGY

## 2016-10-07 NOTE — Telephone Encounter (Signed)
Request for Cardiac clearance:  1. What type of surgery is being performed? Vaginal Biopsy, Bladder Biopsy, Retrograde Pyelogram  2. When is this surgery scheduled? 10/19/16   3. Are there any medications that need to be held prior to surgery and how long? Xarelto 3 days prior to surgery   4. Name of physician performing surgery? Dr. Erlene Quan   5. What is your office phone and fax number? P- [phone], [phone]

## 2016-10-07 NOTE — Telephone Encounter (Signed)
Vaginal wall & bladder biopsies scheduled with Dr Erlene Quan on 10/19/16 per pt request. Pt aware of surgery date, pre-admit testing appt, to call day prior to surgery for arrival time to [facility] & follow up appt with Dr Erlene Quan. Questions answered. Pt voices understanding.

## 2016-10-08 NOTE — Telephone Encounter (Signed)
Pt on Xarelto for Afib with CHADS<4 (CHF, HTN) and not history of stroke. OK to hold 3 days for procedure.

## 2016-10-08 NOTE — Telephone Encounter (Signed)
Clearance faxed attn: Amy to Dr. Cherrie Gauze office. Confirmation received that fax went through.

## 2016-10-08 NOTE — Telephone Encounter (Signed)
No further cardiac testing needed before surgery. Avoid excessive IV fluids to prevent CHF exacerbation.  Anticoag recs per pharmD.

## 2016-10-11 ENCOUNTER — Other Ambulatory Visit: Payer: Self-pay

## 2016-10-11 ENCOUNTER — Encounter
Admission: RE | Admit: 2016-10-11 | Discharge: 2016-10-11 | Disposition: A | Discharge status: Home / Self Care | Payer: Medicare Other | Source: Ambulatory Visit | Attending: Urology | Admitting: Urology

## 2016-10-11 ENCOUNTER — Telehealth: Payer: Self-pay

## 2016-10-11 DIAGNOSIS — I4510 Unspecified right bundle-branch block: Secondary | ICD-10-CM | POA: Insufficient documentation

## 2016-10-11 DIAGNOSIS — Z01812 Encounter for preprocedural laboratory examination: Secondary | ICD-10-CM | POA: Insufficient documentation

## 2016-10-11 DIAGNOSIS — I10 Essential (primary) hypertension: Secondary | ICD-10-CM | POA: Diagnosis not present

## 2016-10-11 DIAGNOSIS — N899 Noninflammatory disorder of vagina, unspecified: Secondary | ICD-10-CM | POA: Insufficient documentation

## 2016-10-11 DIAGNOSIS — N329 Bladder disorder, unspecified: Secondary | ICD-10-CM | POA: Diagnosis not present

## 2016-10-11 DIAGNOSIS — R9431 Abnormal electrocardiogram [ECG] [EKG]: Secondary | ICD-10-CM | POA: Diagnosis not present

## 2016-10-11 DIAGNOSIS — Z01818 Encounter for other preprocedural examination: Secondary | ICD-10-CM | POA: Insufficient documentation

## 2016-10-11 DIAGNOSIS — I4891 Unspecified atrial fibrillation: Secondary | ICD-10-CM | POA: Diagnosis not present

## 2016-10-11 HISTORY — DX: Family history of other specified conditions: Z84.89

## 2016-10-11 HISTORY — DX: Personal history of urinary calculi: Z87.442

## 2016-10-11 LAB — URINALYSIS, ROUTINE W REFLEX MICROSCOPIC
BILIRUBIN URINE: NEGATIVE
Glucose, UA: NEGATIVE mg/dL
KETONES UR: NEGATIVE mg/dL
NITRITE: NEGATIVE
Protein, ur: 30 mg/dL — AB
SPECIFIC GRAVITY, URINE: 1.025 (ref 1.005–1.030)
pH: 6 (ref 5.0–8.0)

## 2016-10-11 LAB — POTASSIUM: Potassium: 3.2 mmol/L — ABNORMAL LOW (ref 3.5–5.1)

## 2016-10-11 NOTE — Patient Instructions (Signed)
  Your procedure is scheduled on: Tuesday October 19, 2016. Report to Same Day Surgery. To find out your arrival time please call [phone] between 1PM - 3PM on Monday October 18, 2016.  Remember: Instructions that are not followed completely may result in serious medical risk, up to and including death, or upon the discretion of your surgeon and anesthesiologist your surgery may need to be rescheduled.    _x___ 1. Do not eat food or drink liquids after midnight. No gum chewing or hard candies.     ____ 2. No Alcohol for 24 hours before or after surgery.   ____ 3. Bring all medications with you on the day of surgery if instructed.    __x__ 4. Notify your doctor if there is any change in your medical condition     (cold, fever, infections).    _____ 5. No smoking 24 hours prior to surgery.     Do not wear jewelry, make-up, hairpins, clips or nail polish.  Do not wear lotions, powders, or perfumes.   Do not shave 48 hours prior to surgery. Men may shave face and neck.  Do not bring valuables to the hospital.    [facility] is not responsible for any belongings or valuables.               Contacts, dentures or bridgework may not be worn into surgery.  Leave your suitcase in the car. After surgery it may be brought to your room.  For patients admitted to the hospital, discharge time is determined by your treatment team.   Patients discharged the day of surgery will not be allowed to drive home.    Please read over the following fact sheets that you were given:   [facility] Preparing for Surgery  _x___ Take these medicines the morning of surgery with A SIP OF WATER:    1. irbesartan (AVAPRO)  2. metoprolol (LOPRESSOR)  3. omeprazole (PRILOSEC)   4. sertraline (ZOLOFT)   ____ Fleet Enema (as directed)   ____ Use CHG Soap as directed on instruction sheet  __x__ Use inhalers on the day of surgery and bring to hospital day of surgery  ____ Stop metformin 2 days prior to  surgery    ____ Take 1/2 of usual insulin dose the night before surgery and none on the morning of surgery.   _x__ Stop Xarelto 3 days prior to surgery per cardiologist  _x__ Stop Anti-inflammatories such as Advil, Aleve, Ibuprofen, Motrin, Naproxen,  Naprosyn, Goodies powders or aspirin products. OK to take Tylenol.   ____ Stop supplements until after surgery.    _x___ Bring C-Pap to the hospital.

## 2016-10-11 NOTE — Telephone Encounter (Signed)
-----   Message from Lucilla Lame, MD sent at 10/07/2016  9:11 PM EDT ----- The patient will need a repeat EGD in 3 years.

## 2016-10-11 NOTE — Pre-Procedure Instructions (Signed)
Compared Today's EKG with one done on 10/23/15 per [place] OK to proceed.

## 2016-10-11 NOTE — Telephone Encounter (Signed)
Pt notified of EGD results. Pt added to 3 year recall.

## 2016-10-11 NOTE — Telephone Encounter (Signed)
-----   Message from Lucilla Lame, MD sent at 10/07/2016  9:10 PM EDT ----- That the patient know that her biopsies of her esophagus showed Barrett's esophagus as was suspected.  If the patient is on a PPI She should continue taking the PPI.

## 2016-10-11 NOTE — Pre-Procedure Instructions (Signed)
Today's potassium level result faxed to Dr. Cherrie Gauze office with request for potassium supplement, potassium level will be rechecked the morning of surgery.

## 2016-10-12 NOTE — Pre-Procedure Instructions (Signed)
CLEARED BY DR Christ Kick 10/08/16

## 2016-10-12 NOTE — Telephone Encounter (Signed)
Advised pt to hold Xarelto beginning 10/16/16 prior to surgery per clearance obtained from Dr Irish Lack. Also encouraged pt to eat 3 bananas on the evening prior to surgery for mildly decreased potassium level. Pt voices understanding.

## 2016-10-13 LAB — URINE CULTURE

## 2016-10-14 ENCOUNTER — Other Ambulatory Visit: Payer: Self-pay | Admitting: Family Medicine

## 2016-10-14 DIAGNOSIS — F329 Major depressive disorder, single episode, unspecified: Secondary | ICD-10-CM

## 2016-10-14 DIAGNOSIS — F32A Depression, unspecified: Secondary | ICD-10-CM

## 2016-10-14 DIAGNOSIS — F419 Anxiety disorder, unspecified: Secondary | ICD-10-CM

## 2016-10-15 ENCOUNTER — Other Ambulatory Visit: Payer: Self-pay | Admitting: Interventional Cardiology

## 2016-10-19 ENCOUNTER — Other Ambulatory Visit: Payer: Self-pay | Admitting: Gastroenterology

## 2016-10-19 ENCOUNTER — Ambulatory Visit: Payer: Medicare Other | Admitting: Anesthesiology

## 2016-10-19 ENCOUNTER — Encounter: Payer: Self-pay | Admitting: *Deleted

## 2016-10-19 ENCOUNTER — Encounter
Admission: RE | Disposition: A | Discharge status: Home / Self Care | Payer: Self-pay | Source: Ambulatory Visit | Attending: Urology

## 2016-10-19 ENCOUNTER — Ambulatory Visit
Admission: RE | Admit: 2016-10-19 | Discharge: 2016-10-19 | Disposition: A | Discharge status: Home / Self Care | Payer: Medicare Other | Source: Ambulatory Visit | Attending: Urology | Admitting: Urology

## 2016-10-19 DIAGNOSIS — N3090 Cystitis, unspecified without hematuria: Secondary | ICD-10-CM | POA: Diagnosis not present

## 2016-10-19 DIAGNOSIS — R1013 Epigastric pain: Secondary | ICD-10-CM

## 2016-10-19 DIAGNOSIS — N898 Other specified noninflammatory disorders of vagina: Secondary | ICD-10-CM | POA: Diagnosis not present

## 2016-10-19 DIAGNOSIS — L900 Lichen sclerosus et atrophicus: Secondary | ICD-10-CM | POA: Insufficient documentation

## 2016-10-19 DIAGNOSIS — I110 Hypertensive heart disease with heart failure: Secondary | ICD-10-CM | POA: Diagnosis not present

## 2016-10-19 DIAGNOSIS — N33 Bladder disorders in diseases classified elsewhere: Secondary | ICD-10-CM | POA: Diagnosis not present

## 2016-10-19 DIAGNOSIS — N329 Bladder disorder, unspecified: Secondary | ICD-10-CM | POA: Diagnosis not present

## 2016-10-19 DIAGNOSIS — N362 Urethral caruncle: Secondary | ICD-10-CM | POA: Diagnosis not present

## 2016-10-19 DIAGNOSIS — N393 Stress incontinence (female) (male): Secondary | ICD-10-CM | POA: Diagnosis not present

## 2016-10-19 DIAGNOSIS — D494 Neoplasm of unspecified behavior of bladder: Secondary | ICD-10-CM | POA: Diagnosis not present

## 2016-10-19 DIAGNOSIS — I509 Heart failure, unspecified: Secondary | ICD-10-CM | POA: Diagnosis not present

## 2016-10-19 HISTORY — PX: CYSTOSCOPY WITH BIOPSY: SHX5122

## 2016-10-19 HISTORY — PX: CYSTOSCOPY W/ RETROGRADES: SHX1426

## 2016-10-19 LAB — POCT I-STAT 4, (NA,K, GLUC, HGB,HCT)
GLUCOSE: 145 mg/dL — AB (ref 65–99)
HEMATOCRIT: 40 % (ref 36.0–46.0)
Hemoglobin: 13.6 g/dL (ref 12.0–15.0)
POTASSIUM: 3.3 mmol/L — AB (ref 3.5–5.1)
Sodium: 141 mmol/L (ref 135–145)

## 2016-10-19 SURGERY — CYSTOSCOPY, WITH BIOPSY
Anesthesia: General | Site: Ureter | Wound class: Clean Contaminated

## 2016-10-19 MED ORDER — IOTHALAMATE MEGLUMINE 43 % IV SOLN
INTRAVENOUS | Status: DC | PRN
  Administered 2016-10-19: 15 mL

## 2016-10-19 MED ORDER — CEFAZOLIN SODIUM-DEXTROSE 2-4 GM/100ML-% IV SOLN
2.0000 g | INTRAVENOUS | Status: AC
  Administered 2016-10-19: 2 g via INTRAVENOUS

## 2016-10-19 MED ORDER — CEFAZOLIN SODIUM-DEXTROSE 2-4 GM/100ML-% IV SOLN
INTRAVENOUS | Status: AC
  Filled 2016-10-19: qty 100

## 2016-10-19 MED ORDER — ONDANSETRON HCL 4 MG/2ML IJ SOLN: 4.0000 mg | Freq: Once | INTRAMUSCULAR | Status: DC | PRN

## 2016-10-19 MED ORDER — DEXAMETHASONE SODIUM PHOSPHATE 10 MG/ML IJ SOLN
INTRAMUSCULAR | Status: AC
  Filled 2016-10-19: qty 1

## 2016-10-19 MED ORDER — FENTANYL CITRATE (PF) 100 MCG/2ML IJ SOLN
25.0000 ug | INTRAMUSCULAR | Status: DC | PRN
  Administered 2016-10-19 (×4): 25 ug via INTRAVENOUS

## 2016-10-19 MED ORDER — FENTANYL CITRATE (PF) 100 MCG/2ML IJ SOLN
INTRAMUSCULAR | Status: AC
  Administered 2016-10-19: 25 ug via INTRAVENOUS
  Filled 2016-10-19: qty 2

## 2016-10-19 MED ORDER — EPHEDRINE SULFATE 50 MG/ML IJ SOLN
INTRAMUSCULAR | Status: AC
  Filled 2016-10-19: qty 1

## 2016-10-19 MED ORDER — HYDROCODONE-ACETAMINOPHEN 5-325 MG PO TABS: 1.0000 | ORAL_TABLET | Freq: Four times a day (QID) | ORAL | 0 refills | Status: DC | PRN

## 2016-10-19 MED ORDER — PROPOFOL 10 MG/ML IV BOLUS
INTRAVENOUS | Status: DC | PRN
  Administered 2016-10-19: 150 mg via INTRAVENOUS

## 2016-10-19 MED ORDER — HYDROCODONE-ACETAMINOPHEN 5-325 MG PO TABS
1.0000 | ORAL_TABLET | Freq: Four times a day (QID) | ORAL | Status: DC | PRN
  Administered 2016-10-19: 1 via ORAL

## 2016-10-19 MED ORDER — PHENYLEPHRINE HCL 10 MG/ML IJ SOLN
INTRAMUSCULAR | Status: AC
  Filled 2016-10-19: qty 1

## 2016-10-19 MED ORDER — OXYBUTYNIN CHLORIDE 5 MG PO TABS: 5.0000 mg | ORAL_TABLET | Freq: Three times a day (TID) | ORAL | 0 refills | Status: DC | PRN

## 2016-10-19 MED ORDER — LACTATED RINGERS IV SOLN
INTRAVENOUS | Status: DC
  Administered 2016-10-19: 07:00:00 via INTRAVENOUS

## 2016-10-19 MED ORDER — DEXAMETHASONE SODIUM PHOSPHATE 10 MG/ML IJ SOLN
INTRAMUSCULAR | Status: DC | PRN
  Administered 2016-10-19: 10 mg via INTRAVENOUS

## 2016-10-19 MED ORDER — PROPOFOL 10 MG/ML IV BOLUS
INTRAVENOUS | Status: AC
  Filled 2016-10-19: qty 20

## 2016-10-19 MED ORDER — LIDOCAINE HCL (PF) 2 % IJ SOLN
INTRAMUSCULAR | Status: AC
  Filled 2016-10-19: qty 2

## 2016-10-19 MED ORDER — ONDANSETRON HCL 4 MG/2ML IJ SOLN
INTRAMUSCULAR | Status: AC
  Filled 2016-10-19: qty 2

## 2016-10-19 MED ORDER — HYDROCODONE-ACETAMINOPHEN 5-325 MG PO TABS
ORAL_TABLET | ORAL | Status: AC
  Filled 2016-10-19: qty 1

## 2016-10-19 MED ORDER — PHENYLEPHRINE HCL 10 MG/ML IJ SOLN
INTRAMUSCULAR | Status: DC | PRN
  Administered 2016-10-19 (×2): 100 ug via INTRAVENOUS

## 2016-10-19 MED ORDER — LIDOCAINE HCL (CARDIAC) 20 MG/ML IV SOLN
INTRAVENOUS | Status: DC | PRN
  Administered 2016-10-19: 50 mg via INTRAVENOUS

## 2016-10-19 MED ORDER — FENTANYL CITRATE (PF) 100 MCG/2ML IJ SOLN
INTRAMUSCULAR | Status: DC | PRN
  Administered 2016-10-19 (×4): 25 ug via INTRAVENOUS

## 2016-10-19 MED ORDER — FENTANYL CITRATE (PF) 100 MCG/2ML IJ SOLN
INTRAMUSCULAR | Status: AC
  Filled 2016-10-19: qty 2

## 2016-10-19 MED ORDER — ONDANSETRON HCL 4 MG/2ML IJ SOLN
INTRAMUSCULAR | Status: DC | PRN
  Administered 2016-10-19: 4 mg via INTRAVENOUS

## 2016-10-19 SURGICAL SUPPLY — 27 items
BAG DRAIN CYSTO-URO LG1000N (MISCELLANEOUS) ×3 IMPLANT
CATH URETL 5X70 OPEN END (CATHETERS) ×3 IMPLANT
CONRAY 43 FOR UROLOGY 50M (MISCELLANEOUS) ×3 IMPLANT
DRAPE UTILITY 15X26 TOWEL STRL (DRAPES) ×3 IMPLANT
DRSG TELFA 4X3 1S NADH ST (GAUZE/BANDAGES/DRESSINGS) ×3 IMPLANT
ELECT REM PT RETURN 9FT ADLT (ELECTROSURGICAL) ×3
ELECTRODE REM PT RTRN 9FT ADLT (ELECTROSURGICAL) ×2 IMPLANT
GAUZE PACK 2X3YD (MISCELLANEOUS) ×1 IMPLANT
GLOVE BIO SURGEON STRL SZ 6.5 (GLOVE) ×3 IMPLANT
GOWN STRL REUS W/ TWL LRG LVL3 (GOWN DISPOSABLE) ×4 IMPLANT
GOWN STRL REUS W/TWL LRG LVL3 (GOWN DISPOSABLE) ×6
KIT RM TURNOVER CYSTO AR (KITS) ×3 IMPLANT
NDL SAFETY ECLIPSE 18X1.5 (NEEDLE) ×2 IMPLANT
NEEDLE HYPO 18GX1.5 SHARP (NEEDLE) ×3
PACK CYSTO AR (MISCELLANEOUS) ×3 IMPLANT
SCRUB POVIDONE IODINE 4 OZ (MISCELLANEOUS) ×3 IMPLANT
SENSORWIRE 0.038 NOT ANGLED (WIRE) ×3
SET CYSTO W/LG BORE CLAMP LF (SET/KITS/TRAYS/PACK) ×3 IMPLANT
SOL .9 NS 3000ML IRR  AL (IV SOLUTION) ×1
SOL .9 NS 3000ML IRR AL (IV SOLUTION) ×2
SOL .9 NS 3000ML IRR UROMATIC (IV SOLUTION) ×2 IMPLANT
SURGILUBE 2OZ TUBE FLIPTOP (MISCELLANEOUS) ×3 IMPLANT
SUT VIC AB 3-0 SH 27 (SUTURE) ×3
SUT VIC AB 3-0 SH 27X BRD (SUTURE) ×1 IMPLANT
WATER STERILE IRR 1000ML POUR (IV SOLUTION) ×3 IMPLANT
WATER STERILE IRR 3000ML UROMA (IV SOLUTION) ×3 IMPLANT
WIRE SENSOR 0.038 NOT ANGLED (WIRE) ×2 IMPLANT

## 2016-10-19 NOTE — Anesthesia Procedure Notes (Signed)
Procedure Name: LMA Insertion Date/Time: 10/19/2016 7:53 AM Performed by: Jonna Clark Pre-anesthesia Checklist: Patient identified, Patient being monitored, Timeout performed, Emergency Drugs available and Suction available Patient Re-evaluated:Patient Re-evaluated prior to induction Oxygen Delivery Method: Circle system utilized Preoxygenation: Pre-oxygenation with 100% oxygen Induction Type: IV induction Ventilation: Mask ventilation without difficulty LMA: LMA inserted LMA Size: 3.5 Tube type: Oral Number of attempts: 1 Placement Confirmation: positive ETCO2 and breath sounds checked- equal and bilateral Tube secured with: Tape Dental Injury: Teeth and Oropharynx as per pre-operative assessment

## 2016-10-19 NOTE — Anesthesia Postprocedure Evaluation (Signed)
Anesthesia Post Note  Patient: Holly Smith  Procedure(s) Performed: Procedure(s) (LRB): CYSTOSCOPY WITH BLADDER BIOPSY, VAGINAL WALL BIOPSY (N/A) CYSTOSCOPY WITH RETROGRADE PYELOGRAM (Bilateral)  Patient location during evaluation: PACU Anesthesia Type: General Level of consciousness: awake and alert Pain management: pain level controlled Vital Signs Assessment: post-procedure vital signs reviewed and stable Respiratory status: spontaneous breathing, nonlabored ventilation, respiratory function stable and patient connected to nasal cannula oxygen Cardiovascular status: blood pressure returned to baseline and stable Postop Assessment: no signs of nausea or vomiting Anesthetic complications: no     Last Vitals:  Vitals:   10/19/16 0948 10/19/16 1025  BP: (!) 152/77 (!) 140/96  Pulse: 75 80  Resp: 18 16  Temp: 36.4 C     Last Pain:  Vitals:   10/19/16 1025  TempSrc:   PainSc: 5                  Molli Barrows

## 2016-10-19 NOTE — Discharge Instructions (Signed)
AMBULATORY SURGERY  DISCHARGE INSTRUCTIONS   1) The drugs that you were given will stay in your system until tomorrow so for the next 24 hours you should not:  A) Drive an automobile B) Make any legal decisions C) Drink any alcoholic beverage   2) You may resume regular meals tomorrow.  Today it is better to start with liquids and gradually work up to solid foods.  You may eat anything you prefer, but it is better to start with liquids, then soup and crackers, and gradually work up to solid foods.   3) Please notify your doctor immediately if you have any unusual bleeding, trouble breathing, redness and pain at the surgery site, drainage, fever, or pain not relieved by medication. 4)   5) Your post-operative visit with Dr.                                     is: Date:                        Time:    Please call to schedule your post-operative visit.  6) Additional Instructions:      Transurethral Resection of Bladder Tumor (TURBT) or Bladder Biopsy   Definition:  Transurethral Resection of the Bladder Tumor is a surgical procedure used to diagnose and remove tumors within the bladder. TURBT is the most common treatment for early stage bladder cancer.  General instructions:     Your recent bladder surgery requires very little post hospital care but some definite precautions.  Despite the fact that no skin incisions were used, the area around the bladder incisions are raw and covered with scabs to promote healing and prevent bleeding. Certain precautions are needed to insure that the scabs are not disturbed over the next 2-4 weeks while the healing proceeds.  Because the raw surface inside your bladder and the irritating effects of urine you may expect frequency of urination and/or urgency (a stronger desire to urinate) and perhaps even getting up at night more often. This will usually resolve or improve slowly over the healing period. You may see some blood in your urine  over the first 6 weeks. Do not be alarmed, even if the urine was clear for a while. Get off your feet and drink lots of fluids until clearing occurs. If you start to pass clots or don't improve call us.  Diet:  You may return to your normal diet immediately. Because of the raw surface of your bladder, alcohol, spicy foods, foods high in acid and drinks with caffeine may cause irritation or frequency and should be used in moderation. To keep your urine flowing freely and avoid constipation, drink plenty of fluids during the day (8-10 glasses). Tip: Avoid cranberry juice because it is very acidic.  Activity:  Your physical activity doesn't need to be restricted. However, if you are very active, you may see some blood in the urine. We suggest that you reduce your activity under the circumstances until the bleeding has stopped.  Bowels:  It is important to keep your bowels regular during the postoperative period. Straining with bowel movements can cause bleeding. A bowel movement every other day is reasonable. Use a mild laxative if needed, such as milk of magnesia 2-3 tablespoons, or 2 Dulcolax tablets. Call if you continue to have problems. If you had been taking narcotics for pain, before, during or  after your surgery, you may be constipated. Take a laxative if necessary.    Medication:  You should resume your pre-surgery medications unless told not to. In addition you may be given an antibiotic to prevent or treat infection. Antibiotics are not always necessary. All medication should be taken as prescribed until the bottles are finished unless you are having an unusual reaction to one of the drugs.  Continue to hold her Xarelto until you have no more blood in urine or in the vagina.  Remove your vaginal packing later today.   [place], [place] [address] [phone]

## 2016-10-19 NOTE — Anesthesia Post-op Follow-up Note (Cosign Needed)
Anesthesia QCDR form completed.        

## 2016-10-19 NOTE — Interval H&P Note (Signed)
History and Physical Interval Note:  10/19/2016 7:25 AM  Holly Smith  has presented today for surgery, with the diagnosis of BLADDER LESIONS, VAGINAL WALL LESION  The various methods of treatment have been discussed with the patient and family. After consideration of risks, benefits and other options for treatment, the patient has consented to  Procedure(s): CYSTOSCOPY WITH BLADDER BIOPSY, VAGINAL WALL BIOPSY (N/A) CYSTOSCOPY WITH RETROGRADE PYELOGRAM (Bilateral) as a surgical intervention .  The patient's history has been reviewed, patient examined, no change in status, stable for surgery.  I have reviewed the patient's chart and labs.  Questions were answered to the patient's satisfaction.    RRR CTAB  Hollice Espy

## 2016-10-19 NOTE — Op Note (Signed)
Date of procedure: 10/19/16  Preoperative diagnosis:  1. Bladder erythema 2. Vaginal lesion   Postoperative diagnosis:  1. Same as above   Procedure: 1. Cystoscopy 2. Bilateral retrograde pyelogram 3. Bladder biopsy 4. Vaginal biopsy  Surgeon: Hollice Espy, MD  Anesthesia: General  Complications: None  Intraoperative findings: Round erythematous lesions diffusely and bladder, hypervascular appearance (see pics).  Squamous metaplasia-like appearance of bladder neck. Normal bilateral retrograde pyelogram.  Whitish plaque-like appearance on anterior vaginal wall on erythematous base, somewhat mottled appearance.  EBL: Minimal  Specimens: Bladder biopsy of erythematous patches, bladder neck biopsies, anterior vaginal wall biopsy  Drains: Vaginal packing  Indication: Holly Smith is a 71 y.o. patient with history of vulvar cancer initially referred for possible urethral abnormality. She underwent cystoscopy which revealed incidental.  After reviewing the management options for treatment, he elected to proceed with the above surgical procedure(s). We have discussed the potential benefits and risks of the procedure, side effects of the proposed treatment, the likelihood of the patient achieving the goals of the procedure, and any potential problems that might occur during the procedure or recuperation. Informed consent has been obtained.  Description of procedure:  The patient was taken to the operating room and general anesthesia was induced.  The patient was placed in the dorsal lithotomy position, prepped and draped in the usual sterile fashion, and preoperative antibiotics were administered. A preoperative time-out was performed.   A 21 French scope was advanced per urethra into the bladder. The bladder was carefully inspected and several abnormalities were identified. There is a whitish plaque-like material at the bladder neck extending towards the trigone most consistent  with squamous metaplasia. In addition to this, there were diffuse changes within the bladder is speckled throughout which were round erythematous lesions slightly larger than glomerulations. These are present without any sort of bladder filling or manipulation. There were no papillary or obvious tumors within the bladder. The trigone was normal in appearance with bilateral ureteral orifice ease with clear yellow reflux of urine. Attention was then turned to the right ureteral orifice which was cannulated using a 5 [place] open-ended ureteral catheter. Gentle retropyelogram was performed which revealed no filling defects or hydroureteronephrosis. The same exact procedure was performed on the left side which was also unremarkable in appearance without hydroureteronephrosis.  Next, cold cup biopsy forceps were used to biopsy approximately 5-6 of the round red lesions within the bladder biopsying ultralow locations in the bladder and the posterior wall, lateral walls, and towards the dome of the bladder. Next, biopsies of the bladder neck were performed. These were passed off the field as separate specimens. Bugbee electrocautery was then used to fulgurate these areas until adequate hemostasis was achieved. The bladder was then drained.  Finally, the vagina was carefully inspected. There was a whitish plaque-like appearance on the anterior vaginal wall which was somewhat mottled in appearance. This did not appear to involve the urethra but was just below this area. I tempted to wipe the whitish plaque-like material off unsuccessfully. The decision was made to proceed with a small vaginal biopsy of this area. An 11 blade was used to excise a small vertical ellipse within the anterior vaginal wall. This was passed off the field as vaginal biopsy. A figure of 8 3-0 Vicryl suture as well as 2 interrupted 3-0 Vicryl sutures were used to reapproximate this area. There was some slight oozing from the superior apex of the  biopsy site. Vaginal packing was applied which seemed to achieve  adequate hemostasis. There she was then cleaned and dried, repositioned supine position, reversed from anesthesia, and taken to the PACU in stable condition  Plan: Patient will follow up next week for biopsy results. Options were answered. She will remove her vaginal packing later today. She may resume anticoagulation once her hematuria and vaginal bleeding subsides.  Hollice Espy, M.D.

## 2016-10-19 NOTE — H&P (View-Only) (Signed)
   10/06/16  CC:  Chief Complaint  Patient presents with  . 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  On follow up with her GYN oncologist, Dr. Isaiah Blakes, she is noted to have been in usual urethral caruncle. This was somewhat hypopigmented. She returned today for cystoscopy for further evaluation to evaluate the urethra.  In addition, she has stress urinary incontinence as well as overactivity. She was given 3 weeks of Mybetriq 25 mg at her last appointment.  She is not taking this medication as she is worried that she will be able to urinate.  Results for orders placed or performed in visit on 10/06/16  BLADDER SCAN AMB NON-IMAGING  Result Value Ref Range   Scan Result 58ml     Blood pressure 113/63, pulse 87, height 5' (1.524 m), weight 215 lb (97.5 kg). NED. A&Ox3.   No respiratory distress   Abd soft, NT, ND Normal external genitalia with patent urethral meatus  Cystoscopy Procedure Note  Patient identification was confirmed, informed consent was obtained, and patient was prepped using Betadine solution.  Lidocaine jelly was administered per urethral meatus.  Of note on examination, she did have a small urethral caruncle.  Additionally, she has a whitish plaque-like appearance on her anterior vaginal wall.  Preoperative abx where received prior to procedure.    Procedure: - Flexible cystoscope introduced, without any difficulty.   - Thorough search of the bladder revealed:    Multiple small punctate lesions throughout the bladder, greatest on left lateral wall of the bladder along with confluences of patchy erythema    no stones    no ulcers     no tumors    no urethral polyps    no trabeculation  - Ureteral orifices were normal in position and appearance.  Changes consistent with squamous metaplasia appreciated bladder neck.  Post-Procedure: - Patient tolerated the  procedure well  Assessment/ Plan:  1. Bladder lesions-most likely cystitis cystica but not pathognomonic for this. I highly recommended bladder biopsy of these lesions. Risks and benefits were discussed in detail including risk of bleeding, infection, damage to surrounding structures. Plan for concomitant retrograde pyelogram further workup. All questions were answered.   2. Vaginal lesion-white plaque-like material on anterior vaginal wall. Since we are going to the operating room to biopsy of bladder, we'll proceed with vaginal biopsies well. She is agreeable with this plan.   Hollice Espy, MD

## 2016-10-19 NOTE — Anesthesia Preprocedure Evaluation (Signed)
Anesthesia Evaluation  Patient identified by MRN, date of birth, ID band Patient awake    Reviewed: Allergy & Precautions, H&P , NPO status , Patient's Chart, lab work & pertinent test results, reviewed documented beta blocker date and time   History of Anesthesia Complications (+) PONV, Family history of anesthesia reaction and history of anesthetic complications  Airway Mallampati: II  TM Distance: >3 FB Neck ROM: full    Dental  (+) Teeth Intact   Pulmonary neg pulmonary ROS, shortness of breath and with exertion, sleep apnea and Continuous Positive Airway Pressure Ventilation , COPD,  COPD inhaler, former smoker,    Pulmonary exam normal        Cardiovascular Exercise Tolerance: Poor hypertension, Pt. on medications +CHF  negative cardio ROS Normal cardiovascular exam Rate:Normal     Neuro/Psych negative neurological ROS  negative psych ROS   GI/Hepatic negative GI ROS, Neg liver ROS, GERD  Medicated,  Endo/Other  negative endocrine ROS  Renal/GU Renal diseasenegative Renal ROS  negative genitourinary   Musculoskeletal  (+) Arthritis ,   Abdominal   Peds  Hematology negative hematology ROS (+)   Anesthesia Other Findings   Reproductive/Obstetrics negative OB ROS                             Anesthesia Physical Anesthesia Plan  ASA: III  Anesthesia Plan: General LMA   Post-op Pain Management:    Induction:   PONV Risk Score and Plan:   Airway Management Planned:   Additional Equipment:   Intra-op Plan:   Post-operative Plan:   Informed Consent: I have reviewed the patients History and Physical, chart, labs and discussed the procedure including the risks, benefits and alternatives for the proposed anesthesia with the patient or authorized representative who has indicated his/her understanding and acceptance.     Plan Discussed with: CRNA  Anesthesia Plan Comments:          Anesthesia Quick Evaluation

## 2016-10-19 NOTE — Transfer of Care (Signed)
Immediate Anesthesia Transfer of Care Note  Patient: Holly Smith  Procedure(s) Performed: Procedure(s): CYSTOSCOPY WITH BLADDER BIOPSY, VAGINAL WALL BIOPSY (N/A) CYSTOSCOPY WITH RETROGRADE PYELOGRAM (Bilateral)  Patient Location: PACU  Anesthesia Type:General  Level of Consciousness: awake, alert  and oriented  Airway & Oxygen Therapy: Patient Spontanous Breathing and Patient connected to face mask oxygen  Post-op Assessment: Report given to RN and Post -op Vital signs reviewed and stable  Post vital signs: Reviewed and stable  Last Vitals:  Vitals:   10/19/16 0618 10/19/16 0837  BP: (!) 143/78 111/86  Pulse: 98 84  Resp: 12 18  Temp: (!) 36.3 C (!) 36.3 C    Last Pain:  Vitals:   10/19/16 0618  TempSrc: Tympanic         Complications: No apparent anesthesia complications

## 2016-10-20 LAB — SURGICAL PATHOLOGY

## 2016-10-26 ENCOUNTER — Ambulatory Visit: Payer: Medicare Other | Admitting: Urology

## 2016-10-26 VITALS — BP 119/83 | HR 102 | Ht 60.0 in | Wt 213.0 lb

## 2016-10-26 DIAGNOSIS — N3281 Overactive bladder: Secondary | ICD-10-CM

## 2016-10-26 DIAGNOSIS — N329 Bladder disorder, unspecified: Secondary | ICD-10-CM | POA: Diagnosis not present

## 2016-10-26 DIAGNOSIS — N904 Leukoplakia of vulva: Secondary | ICD-10-CM

## 2016-10-26 MED ORDER — MIRABEGRON ER 25 MG PO TB24: 25.0000 mg | ORAL_TABLET | Freq: Every day | ORAL | 11 refills | Status: DC

## 2016-10-26 NOTE — Progress Notes (Signed)
10/26/2016 3:32 PM   Holly Smith 1946/01/11 [id number]  Referring provider: Juline Patch, MD [address] [place], [place] [address]  No chief complaint on file.   HPI: 71 year old female initially referred for further evaluation of possible urethral abnormality. She is ultimately found to have several erythematous lesions in her bladder as well as changes at the bladder neck. She was taken to the operating room for cystoscopy, bladder biopsy and bilateral retrograde pyelogram. She has underwent vaginal biopsy due to presence of a whitish adherent plaque on the anterior vaginal wall.  Surgical pathology was reviewed, erythematous lesions consistent with follicular cystitis. She also has squamous metaplasia at the bladder neck. Vaginal biopsies consistent with lichen sclerosis of the vaginal mucosa.  She did have some mild vaginal bleeding following the procedure but this is improved. Her urine remained start but not bloody. She does have some increased urgency or frequency.  She does have baseline stress urinary incontinence as well as bladder overactivity. She was given samples of Mybetriq 25 mg which is helped significantly with her urinary symptoms. She would like a prescription for this.   PMH: Past Medical History:  Diagnosis Date  . A-fib ([place])   . Abnormal Pap smear of vagina and vaginal HPV   . Arthritis    "bones ache" (01/23/2015)  . Cancer ([place])    vulvar cancer  . CHF (congestive heart failure) ([place])   . Chronic anticoagulation    She is on Xarelto for Afib  . Chronic atrial fibrillation ([place])   . Dyspnea   . Echocardiogram abnormal    Mitral regurgitation, tricuspic regurgitation  . Family history of adverse reaction to anesthesia    sister gets very nauseated and vomits  . GERD (gastroesophageal reflux disease)   . History of kidney stones   . History of stress test    Myoview 8/16: EF 42%, anteroseptal, inferoseptal, anterior, apical  anterior and apical defect (likely represents breast attenuation versus scar), no ischemia; intermediate risk because of low EF  . HPV (human papilloma virus) anogenital infection    03/21/12 PAP + HR HPV  . Hypertension   . Insulin resistance   . Kidney stones   . Obesity   . OSA on CPAP   . Postoperative nausea and vomiting 1983   many years ago with hysterectomy  . Urolithiasis   . Vulvar cancer ([place]) 2016   invasive verrucous carcinoma of the vulva. History of genital warts and vulvar condyloma    Surgical History: Past Surgical History:  Procedure Laterality Date  . ABDOMINAL HYSTERECTOMY    . APPENDECTOMY    . BILATERAL SALPINGOOPHORECTOMY  2003   benign ovarian cancer  . CARDIOVERSION  2014-2015   "[place]"  . COLONOSCOPY WITH PROPOFOL N/A 06/10/2015   Procedure: COLONOSCOPY WITH PROPOFOL;  Surgeon: Lucilla Lame, MD;  Location: [facility] ENDOSCOPY;  Service: Endoscopy;  Laterality: N/A;  . CYSTOSCOPY W/ RETROGRADES Bilateral 10/19/2016   Procedure: CYSTOSCOPY WITH RETROGRADE PYELOGRAM;  Surgeon: Hollice Espy, MD;  Location: [facility] ORS;  Service: Urology;  Laterality: Bilateral;  . CYSTOSCOPY WITH BIOPSY N/A 10/19/2016   Procedure: CYSTOSCOPY WITH BLADDER BIOPSY, VAGINAL WALL BIOPSY;  Surgeon: Hollice Espy, MD;  Location: [facility] ORS;  Service: Urology;  Laterality: N/A;  . ESOPHAGOGASTRODUODENOSCOPY (EGD) WITH PROPOFOL N/A 10/05/2016   Surgeon: Lucilla Lame, MD;  Location: [facility] ENDOSCOPY;  Results: Barrett's Esophagus- repeat in 3 years 09/2019  . LITHOTRIPSY    . TONSILLECTOMY    . VULVECTOMY  Right 05/10/2014   Excisional biopsy of the superior right labial majora mass; [place] PARTIAL  07/02/2014   Re-excision and sentinel node dissection at [facility].     Home Medications:  Allergies as of 10/26/2016      Reactions   Ciprofloxacin Other (See Comments)   SOB      Medication List       Accurate as of 10/26/16  3:32 PM. Always use your most  recent med list.          albuterol 108 (90 Base) MCG/ACT inhaler Commonly known as:  PROVENTIL HFA;VENTOLIN HFA Inhale 2 puffs into the lungs every 8 (eight) hours as needed for wheezing or shortness of breath.   dicyclomine 10 MG capsule Commonly known as:  BENTYL TAKE 1 CAPSULE (10 MG TOTAL) BY MOUTH 4 (FOUR) TIMES DAILY - BEFORE MEALS AND AT BEDTIME.   furosemide 40 MG tablet Commonly known as:  LASIX TAKE 2 TABLETS (80 MG TOTAL) BY MOUTH DAILY.   HYDROcodone-acetaminophen 5-325 MG tablet Commonly known as:  NORCO/VICODIN Take 1-2 tablets by mouth every 6 (six) hours as needed for moderate pain.   irbesartan 75 MG tablet Commonly known as:  AVAPRO Take 1 tablet (75 mg total) by mouth daily.   metoprolol succinate 50 MG 24 hr tablet Commonly known as:  TOPROL-XL Take one tablet by mouth twice a day along with 100mg  tablet to equal 150 mg. Take with or immediately following a meal.   metoprolol tartrate 100 MG tablet Commonly known as:  LOPRESSOR Take 1 tablet (100 mg total) by mouth 2 (two) times daily.   mirabegron ER 25 MG Tb24 tablet Commonly known as:  MYRBETRIQ Take 25 mg by mouth every morning.   mirabegron ER 25 MG Tb24 tablet Commonly known as:  MYRBETRIQ Take 1 tablet (25 mg total) by mouth daily.   omeprazole 20 MG capsule Commonly known as:  PRILOSEC TAKE 1 CAPSULE (20 MG TOTAL) BY MOUTH ONCE DAILY.   oxybutynin 5 MG tablet Commonly known as:  DITROPAN Take 1 tablet (5 mg total) by mouth every 8 (eight) hours as needed for bladder spasms.   sertraline 25 MG tablet Commonly known as:  ZOLOFT TAKE 1 TABLET (25 MG TOTAL) BY MOUTH DAILY.   vitamin B-12 1000 MCG tablet Commonly known as:  CYANOCOBALAMIN Take 1,000 mcg by mouth every other day.   XARELTO 20 MG Tabs tablet Generic drug:  rivaroxaban TAKE 1 TABLET (20 MG TOTAL) BY MOUTH DAILY WITH SUPPER. (COPAY IS $215.00 WITH INSURANCE)       Allergies:  Allergies  Allergen Reactions  .  Ciprofloxacin Other (See Comments)    SOB    Family History: Family History  Problem Relation Age of Onset  . Prostate cancer Brother 80       still living and well  . Heart attack Father   . Stroke Mother   . Diabetes Mother   . Hypertension Mother   . Heart attack Brother   . Pancreatic cancer Brother   . Healthy Sister   . Breast cancer Neg Hx   . Kidney cancer Neg Hx   . Bladder Cancer Neg Hx     Social History:  reports that she quit smoking about 4 years ago. Her smoking use included Cigarettes and E-cigarettes. She has a 50.00 pack-year smoking history. She has never used smokeless tobacco. She reports that she does not drink alcohol or use drugs.  ROS: UROLOGY Frequent Urination?: No  Hard to postpone urination?: No Burning/pain with urination?: No Get up at night to urinate?: No Leakage of urine?: No Urine stream starts and stops?: No Trouble starting stream?: No Do you have to strain to urinate?: No Blood in urine?: No Urinary tract infection?: No Sexually transmitted disease?: No Injury to kidneys or bladder?: No Painful intercourse?: No Weak stream?: No Currently pregnant?: No Vaginal bleeding?: No Last menstrual period?: n  Gastrointestinal Nausea?: No Vomiting?: No Indigestion/heartburn?: No Diarrhea?: No Constipation?: No  Constitutional Fever: No Night sweats?: No Weight loss?: No Fatigue?: No  Skin Skin rash/lesions?: No Itching?: No  Eyes Blurred vision?: No Double vision?: No  Ears/Nose/Throat Sore throat?: No Sinus problems?: No  Hematologic/Lymphatic Swollen glands?: No Easy bruising?: No  Cardiovascular Leg swelling?: No Chest pain?: No  Respiratory Cough?: No Shortness of breath?: No  Endocrine Excessive thirst?: No  Musculoskeletal Back pain?: No Joint pain?: No  Neurological Headaches?: No Dizziness?: No  Psychologic Depression?: No Anxiety?: No  Physical Exam: BP 119/83 (BP Location: Left Arm,  Patient Position: Sitting, Cuff Size: Large)   Pulse (!) 102   Ht 5' (1.524 m)   Wt 213 lb (96.6 kg)   BMI 41.60 kg/m   Constitutional:  Alert and oriented, No acute distress.  Accompanied by friend today. HEENT: [place] AT, moist mucus membranes.  Trachea midline, no masses. Cardiovascular: No clubbing, cyanosis, or edema. Respiratory: Normal respiratory effort, no increased work of breathing. Skin: No rashes, bruises or suspicious lesions. Neurologic: Grossly intact, no focal deficits, moving all 4 extremities. Psychiatric: Normal mood and affect.  Laboratory Data: Lab Results  Component Value Date   WBC 9.9 09/07/2016   HGB 13.6 10/19/2016   HCT 40.0 10/19/2016   MCV 90.3 09/07/2016   PLT 249 09/07/2016    Lab Results  Component Value Date   CREATININE 1.03 (H) 09/07/2016    Lab Results  Component Value Date   HGBA1C 6.9 (H) 03/18/2016    Urinalysis n/a  Pertinent Imaging: Bilateral RTG negative  Assessment & Plan:    1. Lesion of bladder Bladder biopsy pathology benign-consistent with squamous metaplasia and follicular cystitis No further follow-up for this needed Resume Xarelto  2. OAB (overactive bladder) Doing well on Mybetriq  25 mg-prescription sent to pharmacy Recheck symptoms in 6 months  3. Lichen sclerosus of female genitalia Vaginal biopsies consistent with lichen sclerosus of the vagina-advised to follow up with Dr. Fransisca Connors   Return in about 6 months (around 04/28/2017) for pvr/ symptoms recheck.  Hollice Espy, MD  [place] [facility] [address] [place], [place] [address] [phone]  CC: Dr. Fransisca Connors

## 2016-11-01 ENCOUNTER — Telehealth: Payer: Self-pay

## 2016-11-01 NOTE — Telephone Encounter (Signed)
Pt called stating 2 weeks ago she had a bladder bx done and came last week for her results. Pt stated that since starting her xarelto again she has seen blood in her urine. Reinforced with pt that this can happen for several more weeks and to increase fluid in take. Pt denied n/v, f/c, dysuria, and back pain. Reinforced with pt if she develops any of these s/s to call back to have a urine cx completed. Pt voiced understanding of whole conversation.

## 2016-11-23 ENCOUNTER — Other Ambulatory Visit: Payer: Self-pay | Admitting: Interventional Cardiology

## 2016-11-23 ENCOUNTER — Other Ambulatory Visit: Payer: Self-pay | Admitting: Gastroenterology

## 2016-12-13 ENCOUNTER — Emergency Department: Payer: Medicare Other

## 2016-12-13 ENCOUNTER — Encounter: Payer: Self-pay | Admitting: Emergency Medicine

## 2016-12-13 ENCOUNTER — Other Ambulatory Visit: Payer: Self-pay

## 2016-12-13 ENCOUNTER — Inpatient Hospital Stay
Admission: EM | Admit: 2016-12-13 | Discharge: 2016-12-16 | DRG: 871 | Disposition: A | Discharge status: Home / Self Care | Payer: Medicare Other | Attending: Internal Medicine | Admitting: Internal Medicine

## 2016-12-13 DIAGNOSIS — I110 Hypertensive heart disease with heart failure: Secondary | ICD-10-CM | POA: Diagnosis present

## 2016-12-13 DIAGNOSIS — I4891 Unspecified atrial fibrillation: Secondary | ICD-10-CM | POA: Diagnosis not present

## 2016-12-13 DIAGNOSIS — R1110 Vomiting, unspecified: Secondary | ICD-10-CM | POA: Diagnosis not present

## 2016-12-13 DIAGNOSIS — Z7901 Long term (current) use of anticoagulants: Secondary | ICD-10-CM

## 2016-12-13 DIAGNOSIS — J9601 Acute respiratory failure with hypoxia: Secondary | ICD-10-CM | POA: Diagnosis present

## 2016-12-13 DIAGNOSIS — Z79899 Other long term (current) drug therapy: Secondary | ICD-10-CM | POA: Diagnosis not present

## 2016-12-13 DIAGNOSIS — I700 Atherosclerosis of aorta: Secondary | ICD-10-CM | POA: Diagnosis not present

## 2016-12-13 DIAGNOSIS — G4733 Obstructive sleep apnea (adult) (pediatric): Secondary | ICD-10-CM | POA: Diagnosis present

## 2016-12-13 DIAGNOSIS — A419 Sepsis, unspecified organism: Secondary | ICD-10-CM | POA: Diagnosis not present

## 2016-12-13 DIAGNOSIS — I509 Heart failure, unspecified: Secondary | ICD-10-CM | POA: Diagnosis not present

## 2016-12-13 DIAGNOSIS — I5032 Chronic diastolic (congestive) heart failure: Secondary | ICD-10-CM | POA: Diagnosis present

## 2016-12-13 DIAGNOSIS — Z87891 Personal history of nicotine dependence: Secondary | ICD-10-CM | POA: Diagnosis not present

## 2016-12-13 DIAGNOSIS — E876 Hypokalemia: Secondary | ICD-10-CM | POA: Diagnosis not present

## 2016-12-13 DIAGNOSIS — N132 Hydronephrosis with renal and ureteral calculous obstruction: Secondary | ICD-10-CM | POA: Diagnosis not present

## 2016-12-13 DIAGNOSIS — R0902 Hypoxemia: Secondary | ICD-10-CM

## 2016-12-13 DIAGNOSIS — N201 Calculus of ureter: Secondary | ICD-10-CM

## 2016-12-13 DIAGNOSIS — Z881 Allergy status to other antibiotic agents status: Secondary | ICD-10-CM

## 2016-12-13 DIAGNOSIS — R319 Hematuria, unspecified: Secondary | ICD-10-CM

## 2016-12-13 DIAGNOSIS — R0602 Shortness of breath: Secondary | ICD-10-CM | POA: Diagnosis not present

## 2016-12-13 DIAGNOSIS — R310 Gross hematuria: Secondary | ICD-10-CM | POA: Diagnosis not present

## 2016-12-13 DIAGNOSIS — N390 Urinary tract infection, site not specified: Secondary | ICD-10-CM | POA: Diagnosis not present

## 2016-12-13 DIAGNOSIS — I081 Rheumatic disorders of both mitral and tricuspid valves: Secondary | ICD-10-CM | POA: Diagnosis present

## 2016-12-13 DIAGNOSIS — N136 Pyonephrosis: Secondary | ICD-10-CM | POA: Diagnosis not present

## 2016-12-13 DIAGNOSIS — R7989 Other specified abnormal findings of blood chemistry: Secondary | ICD-10-CM | POA: Diagnosis not present

## 2016-12-13 DIAGNOSIS — Z9989 Dependence on other enabling machines and devices: Secondary | ICD-10-CM

## 2016-12-13 DIAGNOSIS — K219 Gastro-esophageal reflux disease without esophagitis: Secondary | ICD-10-CM | POA: Diagnosis present

## 2016-12-13 DIAGNOSIS — I482 Chronic atrial fibrillation: Secondary | ICD-10-CM | POA: Diagnosis not present

## 2016-12-13 DIAGNOSIS — Z8249 Family history of ischemic heart disease and other diseases of the circulatory system: Secondary | ICD-10-CM | POA: Diagnosis not present

## 2016-12-13 HISTORY — DX: Sepsis, unspecified organism: A41.9

## 2016-12-13 LAB — COMPREHENSIVE METABOLIC PANEL
ALT: 11 U/L — ABNORMAL LOW (ref 14–54)
ANION GAP: 12 (ref 5–15)
AST: 17 U/L (ref 15–41)
Albumin: 3.6 g/dL (ref 3.5–5.0)
Alkaline Phosphatase: 67 U/L (ref 38–126)
BUN: 16 mg/dL (ref 6–20)
CALCIUM: 9.2 mg/dL (ref 8.9–10.3)
CO2: 29 mmol/L (ref 22–32)
Chloride: 94 mmol/L — ABNORMAL LOW (ref 101–111)
Creatinine, Ser: 0.95 mg/dL (ref 0.44–1.00)
GFR, EST NON AFRICAN AMERICAN: 58 mL/min — AB (ref >60)
Glucose, Bld: 153 mg/dL — ABNORMAL HIGH (ref 65–99)
Potassium: 3.3 mmol/L — ABNORMAL LOW (ref 3.5–5.1)
Sodium: 135 mmol/L (ref 135–145)
Total Bilirubin: 0.9 mg/dL (ref 0.3–1.2)
Total Protein: 8.1 g/dL (ref 6.5–8.1)

## 2016-12-13 LAB — URINALYSIS, COMPLETE (UACMP) WITH MICROSCOPIC
BILIRUBIN URINE: NEGATIVE
Bacteria, UA: NONE SEEN
GLUCOSE, UA: NEGATIVE mg/dL
KETONES UR: NEGATIVE mg/dL
Nitrite: NEGATIVE
PH: 7 (ref 5.0–8.0)
Protein, ur: 100 mg/dL — AB
SPECIFIC GRAVITY, URINE: 1.02 (ref 1.005–1.030)

## 2016-12-13 LAB — LIPASE, BLOOD: LIPASE: 26 U/L (ref 11–51)

## 2016-12-13 LAB — CBC
HCT: 43.3 % (ref 35.0–47.0)
HEMOGLOBIN: 14.7 g/dL (ref 12.0–16.0)
MCH: 30.3 pg (ref 26.0–34.0)
MCHC: 34.1 g/dL (ref 32.0–36.0)
MCV: 89.1 fL (ref 80.0–100.0)
PLATELETS: 244 10*3/uL (ref 150–440)
RBC: 4.86 MIL/uL (ref 3.80–5.20)
RDW: 13.8 % (ref 11.5–14.5)
WBC: 13.3 10*3/uL — AB (ref 3.6–11.0)

## 2016-12-13 LAB — LACTIC ACID, PLASMA: LACTIC ACID, VENOUS: 1.3 mmol/L (ref 0.5–1.9)

## 2016-12-13 LAB — BRAIN NATRIURETIC PEPTIDE: B Natriuretic Peptide: 155 pg/mL — ABNORMAL HIGH (ref 0.0–100.0)

## 2016-12-13 MED ORDER — INFLUENZA VAC SPLIT HIGH-DOSE 0.5 ML IM SUSY
0.5000 mL | PREFILLED_SYRINGE | INTRAMUSCULAR | Status: DC
  Filled 2016-12-13: qty 0.5

## 2016-12-13 MED ORDER — IOPAMIDOL (ISOVUE-300) INJECTION 61%: 30.0000 mL | Freq: Once | INTRAVENOUS | Status: DC | PRN

## 2016-12-13 MED ORDER — METOPROLOL TARTRATE 5 MG/5ML IV SOLN: 5.0000 mg | INTRAVENOUS | Status: DC | PRN

## 2016-12-13 MED ORDER — MORPHINE SULFATE (PF) 4 MG/ML IV SOLN
4.0000 mg | Freq: Once | INTRAVENOUS | Status: AC
  Administered 2016-12-13: 4 mg via INTRAVENOUS

## 2016-12-13 MED ORDER — IOPAMIDOL (ISOVUE-300) INJECTION 61%
100.0000 mL | Freq: Once | INTRAVENOUS | Status: AC | PRN
  Administered 2016-12-13: 100 mL via INTRAVENOUS

## 2016-12-13 MED ORDER — METOCLOPRAMIDE HCL 5 MG/ML IJ SOLN
10.0000 mg | Freq: Once | INTRAMUSCULAR | Status: AC
  Administered 2016-12-13: 10 mg via INTRAVENOUS
  Filled 2016-12-13: qty 2

## 2016-12-13 MED ORDER — MIRABEGRON ER 25 MG PO TB24
25.0000 mg | ORAL_TABLET | Freq: Every day | ORAL | Status: DC
  Administered 2016-12-14 – 2016-12-16 (×3): 25 mg via ORAL
  Filled 2016-12-13 (×3): qty 1

## 2016-12-13 MED ORDER — ACETAMINOPHEN 650 MG RE SUPP: 650.0000 mg | Freq: Four times a day (QID) | RECTAL | Status: DC | PRN

## 2016-12-13 MED ORDER — METOPROLOL TARTRATE 50 MG PO TABS
100.0000 mg | ORAL_TABLET | Freq: Two times a day (BID) | ORAL | Status: DC
  Administered 2016-12-13 – 2016-12-16 (×6): 100 mg via ORAL
  Filled 2016-12-13 (×6): qty 2

## 2016-12-13 MED ORDER — ONDANSETRON HCL 4 MG/2ML IJ SOLN
4.0000 mg | Freq: Once | INTRAMUSCULAR | Status: AC
  Administered 2016-12-13: 4 mg via INTRAVENOUS
  Filled 2016-12-13: qty 2

## 2016-12-13 MED ORDER — PANTOPRAZOLE SODIUM 40 MG PO TBEC
40.0000 mg | DELAYED_RELEASE_TABLET | Freq: Every day | ORAL | Status: DC
  Administered 2016-12-14 – 2016-12-16 (×3): 40 mg via ORAL
  Filled 2016-12-13 (×3): qty 1

## 2016-12-13 MED ORDER — MORPHINE SULFATE (PF) 4 MG/ML IV SOLN
4.0000 mg | Freq: Once | INTRAVENOUS | Status: AC
  Administered 2016-12-13: 4 mg via INTRAVENOUS
  Filled 2016-12-13: qty 1

## 2016-12-13 MED ORDER — HYDROCODONE-ACETAMINOPHEN 5-325 MG PO TABS
1.0000 | ORAL_TABLET | ORAL | Status: DC | PRN
Start: 2016-12-13 — End: 2016-12-16

## 2016-12-13 MED ORDER — CEFTRIAXONE SODIUM IN DEXTROSE 20 MG/ML IV SOLN
1.0000 g | Freq: Once | INTRAVENOUS | Status: AC
  Administered 2016-12-13: 1 g via INTRAVENOUS
  Filled 2016-12-13: qty 50

## 2016-12-13 MED ORDER — ONDANSETRON HCL 4 MG PO TABS: 4.0000 mg | ORAL_TABLET | Freq: Four times a day (QID) | ORAL | Status: DC | PRN

## 2016-12-13 MED ORDER — POTASSIUM CHLORIDE CRYS ER 20 MEQ PO TBCR
40.0000 meq | EXTENDED_RELEASE_TABLET | Freq: Once | ORAL | Status: AC
  Administered 2016-12-13: 40 meq via ORAL
  Filled 2016-12-13: qty 2

## 2016-12-13 MED ORDER — ONDANSETRON HCL 4 MG/2ML IJ SOLN
4.0000 mg | Freq: Four times a day (QID) | INTRAMUSCULAR | Status: DC | PRN
  Administered 2016-12-14 (×2): 4 mg via INTRAVENOUS
  Filled 2016-12-13: qty 2

## 2016-12-13 MED ORDER — SODIUM CHLORIDE 0.9% FLUSH
3.0000 mL | Freq: Two times a day (BID) | INTRAVENOUS | Status: DC
  Administered 2016-12-13 – 2016-12-15 (×5): 3 mL via INTRAVENOUS

## 2016-12-13 MED ORDER — ALBUTEROL SULFATE (2.5 MG/3ML) 0.083% IN NEBU: 2.5000 mg | INHALATION_SOLUTION | RESPIRATORY_TRACT | Status: DC | PRN

## 2016-12-13 MED ORDER — SERTRALINE HCL 50 MG PO TABS
25.0000 mg | ORAL_TABLET | Freq: Every day | ORAL | Status: DC
  Administered 2016-12-14 – 2016-12-16 (×3): 25 mg via ORAL
  Filled 2016-12-13 (×3): qty 1

## 2016-12-13 MED ORDER — MORPHINE SULFATE (PF) 4 MG/ML IV SOLN
INTRAVENOUS | Status: AC
  Administered 2016-12-13: 4 mg via INTRAVENOUS
  Filled 2016-12-13: qty 1

## 2016-12-13 MED ORDER — POTASSIUM CHLORIDE IN NACL 20-0.9 MEQ/L-% IV SOLN
INTRAVENOUS | Status: AC
  Administered 2016-12-13 – 2016-12-14 (×2): via INTRAVENOUS
  Filled 2016-12-13 (×2): qty 1000

## 2016-12-13 MED ORDER — ACETAMINOPHEN 325 MG PO TABS
650.0000 mg | ORAL_TABLET | Freq: Four times a day (QID) | ORAL | Status: DC | PRN
  Administered 2016-12-14 – 2016-12-15 (×3): 650 mg via ORAL
  Filled 2016-12-13 (×3): qty 2

## 2016-12-13 MED ORDER — ENOXAPARIN SODIUM 40 MG/0.4ML ~~LOC~~ SOLN
40.0000 mg | SUBCUTANEOUS | Status: DC
  Administered 2016-12-13 – 2016-12-15 (×3): 40 mg via SUBCUTANEOUS
  Filled 2016-12-13 (×3): qty 0.4

## 2016-12-13 MED ORDER — MORPHINE SULFATE (PF) 2 MG/ML IV SOLN
2.0000 mg | INTRAVENOUS | Status: DC | PRN
  Administered 2016-12-14: 2 mg via INTRAVENOUS
  Filled 2016-12-13: qty 1

## 2016-12-13 MED ORDER — DICYCLOMINE HCL 10 MG PO CAPS
10.0000 mg | ORAL_CAPSULE | Freq: Three times a day (TID) | ORAL | Status: DC
  Administered 2016-12-14 – 2016-12-16 (×8): 10 mg via ORAL
  Filled 2016-12-13 (×8): qty 1

## 2016-12-13 MED ORDER — FUROSEMIDE 10 MG/ML IJ SOLN
40.0000 mg | Freq: Once | INTRAMUSCULAR | Status: DC
  Administered 2016-12-13: 40 mg via INTRAVENOUS
  Filled 2016-12-13: qty 4

## 2016-12-13 MED ORDER — POLYETHYLENE GLYCOL 3350 17 G PO PACK: 17.0000 g | PACK | Freq: Every day | ORAL | Status: DC | PRN

## 2016-12-13 MED ORDER — SODIUM CHLORIDE 0.9 % IV BOLUS (SEPSIS)
1000.0000 mL | Freq: Once | INTRAVENOUS | Status: AC
  Administered 2016-12-13: 1000 mL via INTRAVENOUS

## 2016-12-13 MED ORDER — HEPARIN BOLUS VIA INFUSION
4000.0000 [IU] | Freq: Once | INTRAVENOUS | Status: DC
  Filled 2016-12-13: qty 4000

## 2016-12-13 MED ORDER — CEFTRIAXONE SODIUM IN DEXTROSE 20 MG/ML IV SOLN
1.0000 g | INTRAVENOUS | Status: DC
  Filled 2016-12-13: qty 50

## 2016-12-13 MED ORDER — HEPARIN (PORCINE) IN NACL 100-0.45 UNIT/ML-% IJ SOLN
1150.0000 [IU]/h | INTRAMUSCULAR | Status: DC
  Filled 2016-12-13: qty 250

## 2016-12-13 NOTE — H&P (Signed)
[facility] [place] NAME: Holly Smith    MR#:  [id number]  DATE OF BIRTH:  09/30/1945  DATE OF ADMISSION:  12/13/2016  PRIMARY CARE PHYSICIAN: Juline Patch, MD   REQUESTING/REFERRING PHYSICIAN: Dr. Cherylann Banas  CHIEF COMPLAINT:   Chief Complaint  Patient presents with  . Abdominal Pain  . Weakness    HISTORY OF PRESENT ILLNESS:  Holly Smith  is a 71 y.o. female with a known history of Chronic diastolic CHF, hypertension, sleep apnea, ureteral stones presents to the emergency room complaining of 3 days of left flank pain and decreased urination. Here she has been found to have a 5 mm left ureteral stone with hydronephrosis and UTI with sepsis. Urology was consulted and initially suggested discharging patient home on oral antibiotics and follow-up in the office. The patient has atrial fibrillation with rapid ventricular rate along with episodes of hypoxia in the emergency room. Chest x-ray clear. Patient initially received 1 L normal saline bolus and later Lasix 40 mg for concern for CHF. She does not complain of any shortness of breath. His CPAP at home. Patient is being admitted for sepsis with UTI and left ureteral stone.  PAST MEDICAL HISTORY:   Past Medical History:  Diagnosis Date  . A-fib ([place])   . Abnormal Pap smear of vagina and vaginal HPV   . Arthritis    "bones ache" (01/23/2015)  . Cancer ([place])    vulvar cancer  . CHF (congestive heart failure) ([place])   . Chronic anticoagulation    She is on Xarelto for Afib  . Chronic atrial fibrillation ([place])   . Dyspnea   . Echocardiogram abnormal    Mitral regurgitation, tricuspic regurgitation  . Family history of adverse reaction to anesthesia    sister gets very nauseated and vomits  . GERD (gastroesophageal reflux disease)   . History of kidney stones   . History of stress test    Myoview 8/16: EF 42%, anteroseptal, inferoseptal, anterior, apical anterior and apical defect  (likely represents breast attenuation versus scar), no ischemia; intermediate risk because of low EF  . HPV (human papilloma virus) anogenital infection    03/21/12 PAP + HR HPV  . Hypertension   . Insulin resistance   . Kidney stones   . Obesity   . OSA on CPAP   . Postoperative nausea and vomiting 1983   many years ago with hysterectomy  . Urolithiasis   . Vulvar cancer ([place]) 2016   invasive verrucous carcinoma of the vulva. History of genital warts and vulvar condyloma    PAST SURGICAL HISTORY:   Past Surgical History:  Procedure Laterality Date  . ABDOMINAL HYSTERECTOMY    . APPENDECTOMY    . BILATERAL SALPINGOOPHORECTOMY  2003   benign ovarian cancer  . CARDIOVERSION  2014-2015   "[place]"  . COLONOSCOPY WITH PROPOFOL N/A 06/10/2015   Procedure: COLONOSCOPY WITH PROPOFOL;  Surgeon: Lucilla Lame, MD;  Location: [facility] ENDOSCOPY;  Service: Endoscopy;  Laterality: N/A;  . CYSTOSCOPY W/ RETROGRADES Bilateral 10/19/2016   Procedure: CYSTOSCOPY WITH RETROGRADE PYELOGRAM;  Surgeon: Hollice Espy, MD;  Location: [facility] ORS;  Service: Urology;  Laterality: Bilateral;  . CYSTOSCOPY WITH BIOPSY N/A 10/19/2016   Procedure: CYSTOSCOPY WITH BLADDER BIOPSY, VAGINAL WALL BIOPSY;  Surgeon: Hollice Espy, MD;  Location: [facility] ORS;  Service: Urology;  Laterality: N/A;  . ESOPHAGOGASTRODUODENOSCOPY (EGD) WITH PROPOFOL N/A 10/05/2016   Surgeon: Lucilla Lame, MD;  Location: [facility] ENDOSCOPY;  Results: Barrett's  Esophagus- repeat in 3 years 09/2019  . LITHOTRIPSY    . TONSILLECTOMY    . VULVECTOMY Right 05/10/2014   Excisional biopsy of the superior right labial majora mass; [place] PARTIAL  07/02/2014   Re-excision and sentinel node dissection at [facility].     SOCIAL HISTORY:   Social History  Substance Use Topics  . Smoking status: Former Smoker    Packs/day: 1.00    Years: 50.00    Types: Cigarettes, E-cigarettes    Quit date: 05/20/2012  . Smokeless tobacco:  Never Used  . Alcohol use No     Comment: 01/23/2015 "I' used to drink socially a few times/month"    FAMILY HISTORY:   Family History  Problem Relation Age of Onset  . Prostate cancer Brother 75       still living and well  . Heart attack Father   . Stroke Mother   . Diabetes Mother   . Hypertension Mother   . Heart attack Brother   . Pancreatic cancer Brother   . Healthy Sister   . Breast cancer Neg Hx   . Kidney cancer Neg Hx   . Bladder Cancer Neg Hx     DRUG ALLERGIES:   Allergies  Allergen Reactions  . Ciprofloxacin Other (See Comments)    SOB    REVIEW OF SYSTEMS:   Review of Systems  Constitutional: Positive for malaise/fatigue. Negative for chills, fever and weight loss.  HENT: Negative for hearing loss and nosebleeds.   Eyes: Negative for blurred vision, double vision and pain.  Respiratory: Negative for cough, hemoptysis, sputum production, shortness of breath and wheezing.   Cardiovascular: Negative for chest pain, palpitations, orthopnea and leg swelling.  Gastrointestinal: Negative for abdominal pain, constipation, diarrhea, nausea and vomiting.  Genitourinary: Positive for flank pain and frequency. Negative for dysuria and hematuria.  Musculoskeletal: Negative for back pain, falls and myalgias.  Skin: Negative for rash.  Neurological: Positive for weakness. Negative for dizziness, tremors, sensory change, speech change, focal weakness, seizures and headaches.  Endo/Heme/Allergies: Does not bruise/bleed easily.  Psychiatric/Behavioral: Negative for depression and memory loss. The patient is not nervous/anxious.     MEDICATIONS AT HOME:   Prior to Admission medications   Medication Sig Start Date End Date Taking? Authorizing Provider  albuterol (PROVENTIL HFA;VENTOLIN HFA) 108 (90 Base) MCG/ACT inhaler Inhale 2 puffs into the lungs every 8 (eight) hours as needed for wheezing or shortness of breath. 04/29/16  Yes Juline Patch, MD  dicyclomine (BENTYL)  10 MG capsule Take 10 mg by mouth 4 (four) times daily -  before meals and at bedtime.   Yes [provider]  furosemide (LASIX) 40 MG tablet TAKE 2 TABLETS (80 MG TOTAL) BY MOUTH DAILY. Patient taking differently: Take 80 mg by mouth every morning.  12/01/15  Yes Jettie Booze, MD  irbesartan (AVAPRO) 75 MG tablet Take 1 tablet (75 mg total) by mouth daily. Patient taking differently: Take 37.5 mg by mouth daily.  11/17/15  Yes Jettie Booze, MD  metoprolol tartrate (LOPRESSOR) 100 MG tablet Take 150 mg by mouth 2 (two) times daily.   Yes [provider]  mirabegron ER (MYRBETRIQ) 25 MG TB24 tablet Take 1 tablet (25 mg total) by mouth daily. 10/26/16  Yes Hollice Espy, MD  omeprazole (PRILOSEC) 20 MG capsule Take 20 mg by mouth daily.   Yes [provider]  oxybutynin (DITROPAN) 5 MG tablet Take 1 tablet (5 mg total) by mouth  every 8 (eight) hours as needed for bladder spasms. 10/19/16  Yes Hollice Espy, MD  rivaroxaban (XARELTO) 20 MG TABS tablet Take 20 mg by mouth daily with supper.   Yes [provider]  sertraline (ZOLOFT) 25 MG tablet Take 25 mg by mouth daily.   Yes [provider]  vitamin B-12 (CYANOCOBALAMIN) 1000 MCG tablet Take 1,000 mcg by mouth every other day.    Yes [provider]     VITAL SIGNS:  Blood pressure 105/75, pulse (!) 110, temperature 98.4 F (36.9 C), temperature source Oral, resp. rate (!) 28, height 5\' 1"  (1.549 m), weight 95.3 kg (210 lb), SpO2 95 %.  PHYSICAL EXAMINATION:  Physical Exam  GENERAL:  72 y.o.-year-old patient lying in the bed with no acute distress.  EYES: Pupils equal, round, reactive to light and accommodation. No scleral icterus. Extraocular muscles intact.  HEENT: Head atraumatic, normocephalic. Oropharynx and nasopharynx clear. No oropharyngeal erythema, moist oral mucosa  NECK:  Supple, no jugular venous distention. No thyroid enlargement, no tenderness.  LUNGS: Normal  breath sounds bilaterally, no wheezing, rales, rhonchi. No use of accessory muscles of respiration.  CARDIOVASCULAR: S1, S2 normal. No murmurs, rubs, or gallops.  ABDOMEN: Soft, , nondistended. Bowel sounds present. No organomegaly or mass. Left abdominal pain EXTREMITIES: No pedal edema, cyanosis, or clubbing. + 2 pedal & radial pulses b/l.   NEUROLOGIC: Cranial nerves II through XII are intact. No focal Motor or sensory deficits appreciated b/l PSYCHIATRIC: The patient is alert and oriented x 3. Good affect.  SKIN: No obvious rash, lesion, or ulcer.   LABORATORY PANEL:   CBC  Recent Labs Lab 12/13/16 1324  WBC 13.3*  HGB 14.7  HCT 43.3  PLT 244   ------------------------------------------------------------------------------------------------------------------  Chemistries   Recent Labs Lab 12/13/16 1324  NA 135  K 3.3*  CL 94*  CO2 29  GLUCOSE 153*  BUN 16  CREATININE 0.95  CALCIUM 9.2  AST 17  ALT 11*  ALKPHOS 67  BILITOT 0.9   ------------------------------------------------------------------------------------------------------------------  Cardiac Enzymes No results for input(s): TROPONINI in the last 168 hours. ------------------------------------------------------------------------------------------------------------------  RADIOLOGY:  Dg Chest 2 View  Result Date: 12/13/2016 CLINICAL DATA:  Shortness of breath EXAM: CHEST  2 VIEW COMPARISON:  01/21/2015 FINDINGS: Low lung volumes. The cardio pericardial silhouette is enlarged. The visualized bony structures of the thorax are intact. IMPRESSION: Low volume film without acute cardiopulmonary findings. Electronically Signed   By: Misty Stanley M.D.   On: 12/13/2016 19:49   Ct Abdomen Pelvis W Contrast  Result Date: 12/13/2016 CLINICAL DATA:  71 year old female with left abdominal and pelvic pain for 5 days with vomiting. History of vulvar cancer. EXAM: CT ABDOMEN AND PELVIS WITH CONTRAST TECHNIQUE:  Multidetector CT imaging of the abdomen and pelvis was performed using the standard protocol following bolus administration of intravenous contrast. CONTRAST:  167mL ISOVUE-300 IOPAMIDOL (ISOVUE-300) INJECTION 61% COMPARISON:  01/28/2015 CT FINDINGS: Lower chest: No acute abnormality Hepatobiliary: Mild hepatic steatosis noted. Gallbladder is unremarkable. No biliary dilatation. Pancreas: Unremarkable Spleen: Unremarkable Adrenals/Urinary Tract: A 5 mm proximal left ureteral calculus causes moderate left hydronephrosis and perinephric inflammation. The right kidney, adrenal glands and bladder are unremarkable. Stomach/Bowel: No bowel obstruction, bowel wall thickening or inflammatory changes. Colonic diverticulosis noted without diverticulitis. A small to moderate hiatal hernia is present. The appendix is normal. Vascular/Lymphatic: Aortic atherosclerosis. No enlarged abdominal or pelvic lymph nodes. Reproductive: Status post hysterectomy. No adnexal masses. Other: No ascites, focal collection or pneumoperitoneum. Musculoskeletal: No acute  or significant osseous findings. IMPRESSION: 1. Obstructing 5 mm proximal left ureteral calculus causing moderate left hydronephrosis. 2. Small-moderate hiatal hernia 3. Mild hepatic steatosis 4.  Aortic Atherosclerosis (ICD10-I70.0). Electronically Signed   By: Margarette Canada M.D.   On: 12/13/2016 17:11     IMPRESSION AND PLAN:   * Left ureteral stone with hydronephrosis and UTI. Sepsis present on admission. Patient received fluid bolus. Continue maintenance normal saline. Pain medications added. Ceftriaxone. Urology consulted from emergency room. Suggested no immediate procedure at this time. Ordered urine culture and blood cultures.  * Acute hypoxic respiratory failure likely due to pain and poor inspiratory effort. Chest x-ray clear. Will add incentive spirometer. She did receive 1 dose of IV Lasix in the emergency room but I doubt this is congestive heart failure.  Has no lower extremity edema or JVD or pulmonary edema on chest x-ray. CPAP at night. Wean oxygen as tolerated.  * Atrial fibrillation with rapid ventricular rate due to sepsis. Presently heart rate in the 120s. We'll continue home medications at this time. Should improve with fluids and infection getting better.  Add IV Lopressor. Will hold Xarelto if patient and set up needing a ureteral stent placed.  * Hypokalemia. Replace orally.  * DVT prophylaxis with Lovenox  All the records are reviewed and case discussed with ED provider. Management plans discussed with the patient, family and they are in agreement.  CODE STATUS: FULL CODE  TOTAL TIME TAKING CARE OF THIS PATIENT: 40 minutes.   Hillary Bow R M.D on 12/13/2016 at 9:03 PM  Between 7am to 6pm - Pager - [phone]  After 6pm go to [email or web address] - password [email or web address] [facility]  Office  [phone]  CC: Primary care physician; Juline Patch, MD  Note: This dictation was prepared with Dragon dictation along with smaller phrase technology. Any transcriptional errors that result from this process are unintentional.

## 2016-12-13 NOTE — ED Notes (Signed)
Patient transported to 260.

## 2016-12-13 NOTE — ED Notes (Signed)
Pt reports concern about not urinating all day and is requesting lasix if she will be kept in ED longer. MD made aware. MD also made aware that pt was drowsy and 2L oxygen was placed back on pt because pt was sleeping intermittently and oxygen saturation was dropping.

## 2016-12-13 NOTE — ED Provider Notes (Signed)
[facility] Emergency Department Provider Note ____________________________________________   First MD Initiated Contact with Patient 12/13/16 1510     (approximate)  I have reviewed the triage vital signs and the nursing notes.   HISTORY  Chief Complaint Abdominal Pain and Weakness    HPI Holly Smith is a 71 y.o. female with extensive past medical history as below who presents with abdominal pain for the last 4 days, primarily in the left lower quadrant and radiating to the right side, associated with nausea and several episodes of vomiting, as well as with generalized malaise. Patient denies diarrhea or urinary symptoms. She states the pain is worse when she takes deep breath. She denies fever.  Past Medical History:  Diagnosis Date  . A-fib ([place])   . Abnormal Pap smear of vagina and vaginal HPV   . Arthritis    "bones ache" (01/23/2015)  . Cancer ([place])    vulvar cancer  . CHF (congestive heart failure) ([place])   . Chronic anticoagulation    She is on Xarelto for Afib  . Chronic atrial fibrillation ([place])   . Dyspnea   . Echocardiogram abnormal    Mitral regurgitation, tricuspic regurgitation  . Family history of adverse reaction to anesthesia    sister gets very nauseated and vomits  . GERD (gastroesophageal reflux disease)   . History of kidney stones   . History of stress test    Myoview 8/16: EF 42%, anteroseptal, inferoseptal, anterior, apical anterior and apical defect (likely represents breast attenuation versus scar), no ischemia; intermediate risk because of low EF  . HPV (human papilloma virus) anogenital infection    03/21/12 PAP + HR HPV  . Hypertension   . Insulin resistance   . Kidney stones   . Obesity   . OSA on CPAP   . Postoperative nausea and vomiting 1983   many years ago with hysterectomy  . Urolithiasis   . Vulvar cancer ([place]) 2016   invasive verrucous carcinoma of the vulva. History of genital warts and vulvar  condyloma    Patient Active Problem List   Diagnosis Date Noted  . Dyspepsia   . Gastritis without bleeding   . Other specified diseases of esophagus   . History of alcohol use 09/07/2016  . Hypertensive heart disease 05/12/2016  . Atrial fibrillation, persistent ([place]) 08/13/2015  . COPD (chronic obstructive pulmonary disease) ([place]) 08/13/2015  . Personal history of other specified conditions 08/13/2015  . Insulin resistance 08/13/2015  . Obesity, unspecified 08/13/2015  . Special screening for malignant neoplasms, colon   . Benign neoplasm of ascending colon   . Benign neoplasm of descending colon   . Benign neoplasm of sigmoid colon   . Chronic diastolic heart failure ([place]) 05/05/2015  . Diverticulitis 01/31/2015  . Diverticulitis large intestine w/o perforation or abscess w/bleeding 01/28/2015  . Chronic combined systolic and diastolic CHF, NYHA class 3 ([place]) 01/28/2015  . Acute diverticulitis 01/28/2015  . Diverticulitis of large intestine without perforation or abscess with bleeding   . Acute on chronic diastolic heart failure ([place]) 01/21/2015  . Hypertensive urgency 01/21/2015  . Morbid obesity ([place]) 01/21/2015  . Blood in stool 01/21/2015  . Acute diastolic CHF (congestive heart failure) ([place]) 01/21/2015  . CAFL (chronic airflow limitation) ([place]) 12/16/2014  . Personal history of perinatal problems 12/16/2014  . HTN (hypertension) 12/16/2014  . Hyperlipidemia, unspecified 12/16/2014  . Dysmetabolic syndrome 61/44/3154  . Kidney stones 12/16/2014  . PVC (premature ventricular contraction) 12/16/2014  .  Snoring 12/16/2014  . Stress incontinence 12/16/2014  . Fast heart beat 12/16/2014  . Calculus (=stone) 12/16/2014  . OSA (obstructive sleep apnea) 11/27/2014  . Dyspnea 10/07/2014  . Essential hypertension 10/07/2014  . Obesity 10/07/2014  . Genital warts 08/02/2014  . Condyloma acuminatum of vulva 08/02/2014  . Condylomata lata of vulva 08/02/2014  .  Anticoagulation adequate 08/02/2014  . Chronic atrial fibrillation ([place]) 08/02/2014  . Current tobacco use 08/02/2014  . 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 Malignant neoplasm of vulva ([place]) 06/19/2014  . Vulvar cancer ([place]) 05/10/2014  . Vulvar cancer, carcinoma ([place]) 05/10/2014  . 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 Chest pain 08/20/2013  . Breath shortness 08/20/2013  . CCF (congestive cardiac failure) ([place]) 08/03/2013  . CHF (congestive heart failure) ([place]) 08/03/2013  . H/O transesophageal echocardiography (TEE) for monitoring 06/21/2013  . Echocardiogram abnormal 04/23/2013  . Mitral regurgitation 04/23/2013  . TI (tricuspid incompetence) 04/23/2013    Past Surgical History:  Procedure Laterality Date  . ABDOMINAL HYSTERECTOMY    . APPENDECTOMY    . BILATERAL SALPINGOOPHORECTOMY  2003   benign ovarian cancer  . CARDIOVERSION  2014-2015   "[place]"  . COLONOSCOPY WITH PROPOFOL N/A 06/10/2015   Procedure: COLONOSCOPY WITH PROPOFOL;  Surgeon: Lucilla Lame, MD;  Location: [facility] ENDOSCOPY;  Service: Endoscopy;  Laterality: N/A;  . CYSTOSCOPY W/ RETROGRADES Bilateral 10/19/2016   Procedure: CYSTOSCOPY WITH RETROGRADE PYELOGRAM;  Surgeon: Hollice Espy, MD;  Location: [facility] ORS;  Service: Urology;  Laterality: Bilateral;  . CYSTOSCOPY WITH BIOPSY N/A 10/19/2016   Procedure: CYSTOSCOPY WITH BLADDER BIOPSY, VAGINAL WALL BIOPSY;  Surgeon: Hollice Espy, MD;  Location: [facility] ORS;  Service: Urology;  Laterality: N/A;  . ESOPHAGOGASTRODUODENOSCOPY (EGD) WITH PROPOFOL N/A 10/05/2016   Surgeon: Lucilla Lame, MD;  Location: [facility] ENDOSCOPY;  Results: Barrett's Esophagus- repeat in 3 years 09/2019  . LITHOTRIPSY    . TONSILLECTOMY    . VULVECTOMY Right 05/10/2014   Excisional biopsy of the superior right labial majora mass; [place] PARTIAL  07/02/2014   Re-excision and sentinel node dissection at [facility].     Prior to Admission  medications   Medication Sig Start Date End Date Taking? Authorizing Provider  albuterol (PROVENTIL HFA;VENTOLIN HFA) 108 (90 Base) MCG/ACT inhaler Inhale 2 puffs into the lungs every 8 (eight) hours as needed for wheezing or shortness of breath. 04/29/16   Juline Patch, MD  dicyclomine (BENTYL) 10 MG capsule TAKE 1 CAPSULE (10 MG TOTAL) BY MOUTH 4 (FOUR) TIMES DAILY - BEFORE MEALS AND AT BEDTIME. 10/19/16   Jonathon Bellows, MD  furosemide (LASIX) 40 MG tablet TAKE 2 TABLETS (80 MG TOTAL) BY MOUTH DAILY. Patient taking differently: Take 80 mg by mouth every morning.  12/01/15   Jettie Booze, MD  HYDROcodone-acetaminophen (NORCO/VICODIN) 5-325 MG tablet Take 1-2 tablets by mouth every 6 (six) hours as needed for moderate pain. 10/19/16   Hollice Espy, MD  irbesartan (AVAPRO) 75 MG tablet Take 1 tablet (75 mg total) by mouth daily. Patient taking differently: Take 75 mg by mouth daily. 1/2 tablet in am. 11/17/15   Jettie Booze, MD  metoprolol (LOPRESSOR) 100 MG tablet Take 1 tablet (100 mg total) by mouth 2 (two) times daily. Patient taking differently: Take 100 mg by mouth 2 (two) times daily. 100 mg tablet + 50 mg tablet twice a day =150 mg twice a day. 07/22/16 10/20/16  Thompson Grayer, MD  metoprolol succinate (TOPROL-XL) 50  MG 24 hr tablet Take one tablet by mouth twice a day along with 100mg  tablet to equal 150 mg. Take with or immediately following a meal. 07/20/16   Jettie Booze, MD  mirabegron ER (MYRBETRIQ) 25 MG TB24 tablet Take 25 mg by mouth every morning.    [provider]  mirabegron ER (MYRBETRIQ) 25 MG TB24 tablet Take 1 tablet (25 mg total) by mouth daily. 10/26/16   Hollice Espy, MD  omeprazole (PRILOSEC) 20 MG capsule TAKE 1 CAPSULE (20 MG TOTAL) BY MOUTH ONCE DAILY. 11/25/16   Lucilla Lame, MD  oxybutynin (DITROPAN) 5 MG tablet Take 1 tablet (5 mg total) by mouth every 8 (eight) hours as needed for bladder spasms. 10/19/16   Hollice Espy, MD  sertraline  (ZOLOFT) 25 MG tablet TAKE 1 TABLET (25 MG TOTAL) BY MOUTH DAILY. 10/14/16   Juline Patch, MD  vitamin B-12 (CYANOCOBALAMIN) 1000 MCG tablet Take 1,000 mcg by mouth every other day.     [provider]  XARELTO 20 MG TABS tablet TAKE 1 TABLET (20 MG TOTAL) BY MOUTH DAILY WITH SUPPER. (COPAY IS $215.00 WITH INSURANCE) Patient not taking: Reported on 10/26/2016 10/15/16   Jettie Booze, MD    Allergies Ciprofloxacin  Family History  Problem Relation Age of Onset  . Prostate cancer Brother 74       still living and well  . Heart attack Father   . Stroke Mother   . Diabetes Mother   . Hypertension Mother   . Heart attack Brother   . Pancreatic cancer Brother   . Healthy Sister   . Breast cancer Neg Hx   . Kidney cancer Neg Hx   . Bladder Cancer Neg Hx     Social History Social History  Substance Use Topics  . Smoking status: Former Smoker    Packs/day: 1.00    Years: 50.00    Types: Cigarettes, E-cigarettes    Quit date: 05/20/2012  . Smokeless tobacco: Never Used  . Alcohol use No     Comment: 01/23/2015 "I' used to drink socially a few times/month"    Review of Systems  Constitutional: No fever. Eyes: No visual changes. ENT: No sore throat. Cardiovascular: Denies chest pain. Respiratory: Denies shortness of breath. Gastrointestinal: Positive for nausea and vomiting.   Genitourinary: Negative for dysuria.  Musculoskeletal: Negative for back pain. Skin: Negative for rash. Neurological: Negative for headaches.   ____________________________________________   PHYSICAL EXAM:  VITAL SIGNS: ED Triage Vitals  Enc Vitals Group     BP 12/13/16 1319 140/85     Pulse Rate 12/13/16 1319 (!) 117     Resp 12/13/16 1319 20     Temp 12/13/16 1319 98.4 F (36.9 C)     Temp Source 12/13/16 1319 Oral     SpO2 12/13/16 1319 91 %     Weight 12/13/16 1320 210 lb (95.3 kg)     Height 12/13/16 1320 5\' 1"  (1.549 m)     Head Circumference --      Peak Flow --       Pain Score 12/13/16 1318 8     Pain Loc --      Pain Edu? --      Excl. in [place]? --     Constitutional: Alert and oriented. Relatively well appearing and in no acute distress. Eyes: Conjunctivae are normal. No scleral icterus.  Head: Atraumatic. Nose: No congestion/rhinnorhea. Mouth/Throat: Mucous membranes are moist.   Neck: Normal range of motion.  Cardiovascular: Normal rate, regular rhythm. Grossly normal heart sounds.  Good peripheral circulation. Respiratory: Normal respiratory effort.  No retractions. Lungs CTAB. Gastrointestinal: Soft with significant bilat lower quadrant tenderness. No distention.  Genitourinary: No CVA tenderness. Musculoskeletal: No lower extremity edema.  Extremities warm and well perfused.  Neurologic:  Normal speech and language. No gross focal neurologic deficits are appreciated.  Skin:  Skin is warm and dry. No rash noted. Psychiatric: Mood and affect are normal. Speech and behavior are normal.  ____________________________________________   LABS (all labs ordered are listed, but only abnormal results are displayed)  Labs Reviewed  COMPREHENSIVE METABOLIC PANEL - Abnormal; Notable for the following:       Result Value   Potassium 3.3 (*)    Chloride 94 (*)    Glucose, Bld 153 (*)    ALT 11 (*)    GFR calc non Af Amer 58 (*)    All other components within normal limits  CBC - Abnormal; Notable for the following:    WBC 13.3 (*)    All other components within normal limits  URINALYSIS, COMPLETE (UACMP) WITH MICROSCOPIC - Abnormal; Notable for the following:    Color, Urine AMBER (*)    APPearance TURBID (*)    Hgb urine dipstick SMALL (*)    Protein, ur 100 (*)    Leukocytes, UA MODERATE (*)    Squamous Epithelial / LPF 6-30 (*)    All other components within normal limits  LIPASE, BLOOD  LACTIC ACID, PLASMA  LACTIC ACID, PLASMA   ____________________________________________  EKG  ED ECG REPORT I, Arta Silence, the attending  physician, personally viewed and interpreted this ECG.  Date: 12/13/2016 EKG Time: 1956 Rate: 100 Rhythm: atrial fibrillation, PVCs QRS Axis: normal Intervals: prolonged QT ST/T Wave abnormalities: T wave flattening laterally  Narrative Interpretation: no evidence of acute ischemia; no significant change when compared to EKG of 10/23/2015  ____________________________________________  RADIOLOGY  Chest x-ray with no acute cardiopulmonary findings  CT abdomen with 51mm left proximal ureteral stone, with hydronephrosis.   ____________________________________________   PROCEDURES  Procedure(s) performed: No    Critical Care performed: No ____________________________________________   INITIAL IMPRESSION / ASSESSMENT AND PLAN / ED COURSE  Pertinent labs & imaging results that were available during my care of the patient were reviewed by me and considered in my medical decision making (see chart for details).  71 year old female with extensive past medical history as noted above presents with several days of primarily left lower quadrant pain associated with nausea and vomiting. Patient is slightly tachycardic, other vital signs within normal limits, her exam is as described with significant bilateral lower quadrant tenderness. Differential includes diverticulitis, colitis, urinary tract infection and cystitis, less likely ureteral stone, other enteritis, or hepatobiliary cause.  Plan: labs, fluids, antiemetic, analgesia, and CT abdomen then reassess.  Although pt has hx of CHF she does not appear fluid overloaded currently and has had dec urine output, so may be intravascularly volume depleted - will reassess fluid status after bolus.     ----------------------------------------- 8:20 PM on 12/13/2016 -----------------------------------------  CT revealed proximal 5 mm ureteral stone with hydronephrosis.  White blood cell count is slightly elevated and the UA shows WBCs and RBCs,  concerning for infection.  I discussed the case with Dr. Junious Silk from [facility] urology who recommended that patient could be treated with antibiotic as outpatient and follow up closely with their practice this week for further potential intervention. He expressed strong desire to go home. However  on reassessment I'm somewhat concerned for patient's respiratory status. Patient is baseline hypoxic to 91 and uses CPAP at night, and here has been dipping into the high 80s on room air and mid 90s on 2 L nasal cannula. She is also persistently tachycardic with heart rate ranging from 100-125, and is in A. Fib.  I believe that this may be partly due to pain with deep breathing and decreased respiratory effort in the last several days, however there may in fact be component of CHF. Patient had no significant worsening after the fluids but did not improve either.  She also now states that she did not take her medication today, including her lasix.  I will give lasix, IV ceftriaxone for the UTI, and admit due to the hypoxia and tachycardia.   ----------------------------------------- 8:31 PM on 12/13/2016 -----------------------------------------  Signed out to hospitalist.  ____________________________________________   FINAL CLINICAL IMPRESSION(S) / ED DIAGNOSES  Final diagnoses:  Left ureteral stone  Urinary tract infection with hematuria, site unspecified  Hypoxia      NEW MEDICATIONS STARTED DURING THIS VISIT:  New Prescriptions   No medications on file     Note:  This document was prepared using Dragon voice recognition software and may include unintentional dictation errors.    Arta Silence, MD 12/13/16 2032

## 2016-12-13 NOTE — ED Triage Notes (Signed)
States lower L abd pain and weakness x 4 days.

## 2016-12-13 NOTE — ED Notes (Signed)
Admitting MD at bedside with patient and patient's family.

## 2016-12-13 NOTE — Progress Notes (Addendum)
ANTICOAGULATION CONSULT NOTE - Initial Consult  Pharmacy Consult for heparin Indication: PVD with acute embolus  Allergies  Allergen Reactions  . Ciprofloxacin Other (See Comments)    SOB    Patient Measurements: Height: 5\' 1"  (154.9 cm) Weight: 210 lb (95.3 kg) IBW/kg (Calculated) : 47.8 Heparin Dosing Weight: 70.4 kg  Vital Signs: Temp: 98.4 F (36.9 C) (09/24 1319) Temp Source: Oral (09/24 1319) BP: 140/85 (09/24 1319) Pulse Rate: 117 (09/24 1319)  Labs:  Recent Labs  12/13/16 1324  HGB 14.7  HCT 43.3  PLT 244  CREATININE 0.95    Estimated Creatinine Clearance: 55.6 mL/min (by C-G formula based on SCr of 0.95 mg/dL).   Medical History: Past Medical History:  Diagnosis Date  . A-fib ([place])   . Abnormal Pap smear of vagina and vaginal HPV   . Arthritis    "bones ache" (01/23/2015)  . Cancer ([place])    vulvar cancer  . CHF (congestive heart failure) ([place])   . Chronic anticoagulation    She is on Xarelto for Afib  . Chronic atrial fibrillation ([place])   . Dyspnea   . Echocardiogram abnormal    Mitral regurgitation, tricuspic regurgitation  . Family history of adverse reaction to anesthesia    sister gets very nauseated and vomits  . GERD (gastroesophageal reflux disease)   . History of kidney stones   . History of stress test    Myoview 8/16: EF 42%, anteroseptal, inferoseptal, anterior, apical anterior and apical defect (likely represents breast attenuation versus scar), no ischemia; intermediate risk because of low EF  . HPV (human papilloma virus) anogenital infection    03/21/12 PAP + HR HPV  . Hypertension   . Insulin resistance   . Kidney stones   . Obesity   . OSA on CPAP   . Postoperative nausea and vomiting 1983   many years ago with hysterectomy  . Urolithiasis   . Vulvar cancer ([place]) 2016   invasive verrucous carcinoma of the vulva. History of genital warts and vulvar condyloma    Medications:  Infusions:  . heparin       Assessment: 71 yof cc abdominal pain/weakness with extensive PMH including AF on Xarelto (question last time patient took this med), CHF, HTN, insulin resistance, OSA on CPAP. Has had abdominal pain x 4 days, LLQ, radiating to right side, worse on inspiration. Tachycardia noted, other vital signs normal. No imaging available for review at this time. EDP orders UFH PTD for PVD/acute embolus.   Goal of Therapy:  Heparin level 0.3-0.7 units/ml Monitor platelets by anticoagulation protocol: Yes   Plan:  Give 4000 units bolus x 1 Start heparin infusion at 1150 units/hr Check anti-Xa level in 8 hours and daily while on heparin Continue to monitor H&H and platelets  9/24 1600 ordering EDP discontinues heparin and consult.   Laural Benes, Pharm.D., BCPS Clinical Pharmacist 12/13/2016,3:59 PM

## 2016-12-13 NOTE — ED Notes (Addendum)
Patient transported to X-ray 

## 2016-12-13 NOTE — ED Notes (Signed)
Pt currently drinking CT oral contrast.

## 2016-12-13 NOTE — ED Notes (Signed)
Patient transported to X-ray 

## 2016-12-13 NOTE — ED Notes (Signed)
Pt able to ambulate to restroom with one assist. Pt is unsteady on feet and should not ambulate independently.

## 2016-12-13 NOTE — ED Notes (Signed)
Pt placed on monitor after medication administration and pt had oxygen saturation of 79% on RA. Pt reports she wears C-pap at home to sleep but denies oxygen use at home. Pt placed on 2L and MD made aware.

## 2016-12-14 ENCOUNTER — Encounter: Payer: Self-pay | Admitting: Anesthesiology

## 2016-12-14 ENCOUNTER — Inpatient Hospital Stay: Payer: Medicare Other | Admitting: Anesthesiology

## 2016-12-14 ENCOUNTER — Encounter
Admission: EM | Disposition: A | Discharge status: Home / Self Care | Payer: Self-pay | Source: Home / Self Care | Attending: Internal Medicine

## 2016-12-14 DIAGNOSIS — N201 Calculus of ureter: Secondary | ICD-10-CM

## 2016-12-14 DIAGNOSIS — R319 Hematuria, unspecified: Secondary | ICD-10-CM

## 2016-12-14 DIAGNOSIS — N132 Hydronephrosis with renal and ureteral calculous obstruction: Secondary | ICD-10-CM | POA: Diagnosis not present

## 2016-12-14 DIAGNOSIS — N390 Urinary tract infection, site not specified: Secondary | ICD-10-CM

## 2016-12-14 HISTORY — PX: CYSTOSCOPY WITH STENT PLACEMENT: SHX5790

## 2016-12-14 LAB — BASIC METABOLIC PANEL
Anion gap: 7 (ref 5–15)
BUN: 15 mg/dL (ref 6–20)
CALCIUM: 8.1 mg/dL — AB (ref 8.9–10.3)
CO2: 30 mmol/L (ref 22–32)
Chloride: 97 mmol/L — ABNORMAL LOW (ref 101–111)
Creatinine, Ser: 0.97 mg/dL (ref 0.44–1.00)
GFR calc Af Amer: >60 mL/min (ref >60)
GFR, EST NON AFRICAN AMERICAN: 57 mL/min — AB (ref >60)
GLUCOSE: 142 mg/dL — AB (ref 65–99)
POTASSIUM: 3.7 mmol/L (ref 3.5–5.1)
Sodium: 134 mmol/L — ABNORMAL LOW (ref 135–145)

## 2016-12-14 LAB — CBC
HEMATOCRIT: 38.5 % (ref 35.0–47.0)
Hemoglobin: 13.1 g/dL (ref 12.0–16.0)
MCH: 30.6 pg (ref 26.0–34.0)
MCHC: 34 g/dL (ref 32.0–36.0)
MCV: 89.9 fL (ref 80.0–100.0)
Platelets: 217 10*3/uL (ref 150–440)
RBC: 4.28 MIL/uL (ref 3.80–5.20)
RDW: 13.9 % (ref 11.5–14.5)
WBC: 12.9 10*3/uL — ABNORMAL HIGH (ref 3.6–11.0)

## 2016-12-14 LAB — LACTIC ACID, PLASMA: LACTIC ACID, VENOUS: 1 mmol/L (ref 0.5–1.9)

## 2016-12-14 SURGERY — CYSTOSCOPY, WITH STENT INSERTION
Anesthesia: Choice | Site: Ureter | Laterality: Left | Wound class: Clean Contaminated

## 2016-12-14 MED ORDER — FENTANYL CITRATE (PF) 100 MCG/2ML IJ SOLN
INTRAMUSCULAR | Status: DC | PRN
  Administered 2016-12-14: 50 ug via INTRAVENOUS

## 2016-12-14 MED ORDER — PROPOFOL 10 MG/ML IV BOLUS
INTRAVENOUS | Status: AC
  Filled 2016-12-14: qty 20

## 2016-12-14 MED ORDER — FENTANYL CITRATE (PF) 250 MCG/5ML IJ SOLN
INTRAMUSCULAR | Status: AC
  Filled 2016-12-14: qty 5

## 2016-12-14 MED ORDER — DEXAMETHASONE SODIUM PHOSPHATE 10 MG/ML IJ SOLN
INTRAMUSCULAR | Status: AC
  Filled 2016-12-14: qty 1

## 2016-12-14 MED ORDER — CEFTRIAXONE SODIUM 1 G IJ SOLR
INTRAMUSCULAR | Status: AC
  Filled 2016-12-14: qty 10

## 2016-12-14 MED ORDER — SUCCINYLCHOLINE CHLORIDE 20 MG/ML IJ SOLN
INTRAMUSCULAR | Status: DC | PRN
  Administered 2016-12-14: 100 mg via INTRAVENOUS

## 2016-12-14 MED ORDER — DEXTROSE 5 % IV SOLN
1.0000 g | INTRAVENOUS | Status: DC
  Administered 2016-12-14 – 2016-12-15 (×2): 1 g via INTRAVENOUS
  Filled 2016-12-14 (×3): qty 10

## 2016-12-14 MED ORDER — ESMOLOL HCL 100 MG/10ML IV SOLN
INTRAVENOUS | Status: DC | PRN
  Administered 2016-12-14: 15 mg via INTRAVENOUS

## 2016-12-14 MED ORDER — FENTANYL CITRATE (PF) 100 MCG/2ML IJ SOLN: 25.0000 ug | INTRAMUSCULAR | Status: DC | PRN

## 2016-12-14 MED ORDER — IOTHALAMATE MEGLUMINE 43 % IV SOLN
INTRAVENOUS | Status: DC | PRN
  Administered 2016-12-14: 4 mL

## 2016-12-14 MED ORDER — PHENYLEPHRINE HCL 10 MG/ML IJ SOLN
INTRAMUSCULAR | Status: DC | PRN
  Administered 2016-12-14: 200 ug via INTRAVENOUS
  Administered 2016-12-14: 300 ug via INTRAVENOUS

## 2016-12-14 MED ORDER — MIDAZOLAM HCL 2 MG/2ML IJ SOLN
INTRAMUSCULAR | Status: DC | PRN
  Administered 2016-12-14: 2 mg via INTRAVENOUS

## 2016-12-14 MED ORDER — LACTATED RINGERS IV SOLN
INTRAVENOUS | Status: DC | PRN
  Administered 2016-12-14: 09:00:00 via INTRAVENOUS

## 2016-12-14 MED ORDER — ONDANSETRON HCL 4 MG/2ML IJ SOLN
INTRAMUSCULAR | Status: AC
  Filled 2016-12-14: qty 2

## 2016-12-14 MED ORDER — MIDAZOLAM HCL 2 MG/2ML IJ SOLN
INTRAMUSCULAR | Status: AC
  Filled 2016-12-14: qty 2

## 2016-12-14 MED ORDER — VASOPRESSIN 20 UNIT/ML IV SOLN
INTRAVENOUS | Status: DC | PRN
  Administered 2016-12-14: 1 [IU] via INTRAVENOUS

## 2016-12-14 MED ORDER — SEVOFLURANE IN SOLN
RESPIRATORY_TRACT | Status: AC
  Filled 2016-12-14: qty 250

## 2016-12-14 MED ORDER — PROPOFOL 10 MG/ML IV BOLUS
INTRAVENOUS | Status: DC | PRN
  Administered 2016-12-14: 150 mg via INTRAVENOUS

## 2016-12-14 MED ORDER — ONDANSETRON HCL 4 MG/2ML IJ SOLN: 4.0000 mg | Freq: Once | INTRAMUSCULAR | Status: DC | PRN

## 2016-12-14 SURGICAL SUPPLY — 20 items
BAG DRAIN CYSTO-URO LG1000N (MISCELLANEOUS) ×2 IMPLANT
CATH URETL 5X70 OPEN END (CATHETERS) ×2 IMPLANT
CONRAY 43 FOR UROLOGY 50M (MISCELLANEOUS) ×2 IMPLANT
GLOVE BIO SURGEON STRL SZ 6.5 (GLOVE) ×2 IMPLANT
GOWN STRL REUS W/ TWL LRG LVL3 (GOWN DISPOSABLE) ×2 IMPLANT
GOWN STRL REUS W/TWL LRG LVL3 (GOWN DISPOSABLE) ×4
KIT RM TURNOVER CYSTO AR (KITS) ×2 IMPLANT
PACK CYSTO AR (MISCELLANEOUS) ×2 IMPLANT
SCRUB POVIDONE IODINE 4 OZ (MISCELLANEOUS) IMPLANT
SENSORWIRE 0.038 NOT ANGLED (WIRE) ×2
SET CYSTO W/LG BORE CLAMP LF (SET/KITS/TRAYS/PACK) ×2 IMPLANT
SOL .9 NS 3000ML IRR  AL (IV SOLUTION) ×1
SOL .9 NS 3000ML IRR AL (IV SOLUTION) ×1
SOL .9 NS 3000ML IRR UROMATIC (IV SOLUTION) ×1 IMPLANT
STENT URET 6FRX24 CONTOUR (STENTS) ×1 IMPLANT
STENT URET 6FRX26 CONTOUR (STENTS) IMPLANT
SURGILUBE 2OZ TUBE FLIPTOP (MISCELLANEOUS) ×2 IMPLANT
SYRINGE IRR TOOMEY STRL 70CC (SYRINGE) IMPLANT
WATER STERILE IRR 1000ML POUR (IV SOLUTION) ×2 IMPLANT
WIRE SENSOR 0.038 NOT ANGLED (WIRE) ×1 IMPLANT

## 2016-12-14 NOTE — Anesthesia Post-op Follow-up Note (Signed)
Anesthesia QCDR form completed.        

## 2016-12-14 NOTE — Anesthesia Preprocedure Evaluation (Addendum)
Anesthesia Evaluation  Patient identified by MRN, date of birth, ID band Patient awake    Reviewed: Allergy & Precautions, NPO status , Patient's Chart, lab work & pertinent test results  History of Anesthesia Complications (+) PONV  Airway Mallampati: III       Dental   Pulmonary sleep apnea and Continuous Positive Airway Pressure Ventilation , COPD,  COPD inhaler, former smoker,           Cardiovascular hypertension, Pt. on medications +CHF  + dysrhythmias (Afib with RVR) Atrial Fibrillation      Neuro/Psych neg Seizures    GI/Hepatic GERD  Medicated,  Endo/Other    Renal/GU Renal disease (stones with UTI and sepsis)     Musculoskeletal   Abdominal   Peds  Hematology   Anesthesia Other Findings   Reproductive/Obstetrics                            Anesthesia Physical Anesthesia Plan  ASA: III and emergent  Anesthesia Plan: General   Post-op Pain Management:    Induction: Intravenous  PONV Risk Score and Plan: 4 or greater and Ondansetron, Dexamethasone, Midazolam, Scopolamine patch - Pre-op and Treatment may vary due to age or medical condition  Airway Management Planned: Oral ETT  Additional Equipment:   Intra-op Plan:   Post-operative Plan:   Informed Consent: I have reviewed the patients History and Physical, chart, labs and discussed the procedure including the risks, benefits and alternatives for the proposed anesthesia with the patient or authorized representative who has indicated his/her understanding and acceptance.     Plan Discussed with:   Anesthesia Plan Comments:         Anesthesia Quick Evaluation

## 2016-12-14 NOTE — Progress Notes (Signed)
Dr. Ronelle Nigh wants patient back on her cpap machine. Called floor and will have them bring for her to use.

## 2016-12-14 NOTE — Transfer of Care (Signed)
Immediate Anesthesia Transfer of Care Note  Patient: Holly Smith  Procedure(s) Performed: Procedure(s): CYSTOSCOPY WITH STENT PLACEMENT (Left)  Patient Location: PACU  Anesthesia Type:General  Level of Consciousness: sedated  Airway & Oxygen Therapy: Patient Spontanous Breathing and Patient connected to face mask oxygen  Post-op Assessment: Report given to RN and Post -op Vital signs reviewed and stable  Post vital signs: Reviewed and stable  Last Vitals:  Vitals:   12/14/16 0933 12/14/16 0934  BP:  112/71  Pulse: (!) 130   Resp: (!) 36   Temp:  36.4 C  SpO2: 100% 98%    Last Pain:  Vitals:   12/14/16 0816  TempSrc: Oral  PainSc: 0-No pain         Complications: No apparent anesthesia complications

## 2016-12-14 NOTE — Plan of Care (Signed)
Problem: Education: Goal: Knowledge of [facility] Education information/materials will improve Outcome: Progressing Pt admitted from ED, with sepsis due to UTI. IV fluids with 20 of K, potassium back up to 3.7. Pt can be incontinent at times, otherwise up to [facility] with one assist. No complaints of pain, wore CPAP all night.

## 2016-12-14 NOTE — Progress Notes (Signed)
15 minute call to floor. 

## 2016-12-14 NOTE — Consult Note (Signed)
Urology Consult  I have been asked to see the patient by Dr. Margaretmary Eddy Dr. Cherylann Banas, for evaluation and management of obstructing left ureteral calculus.  Chief Complaint: left flank pain  History of Present Illness: Holly Smith is a 71 y.o. year old with a 5 mm obstructing left proximal ureteral stone with moderate hydroureteronephrosis admitted to the hospitalist service last night in the setting of a positive urinalysis, leukocytosis, episode of A. Fib with RVR and hypoxia concerning for sepsis. This morning, she spent a low-grade temp to 100.5.   She reports that she is been experiencing left lower quadrant pain with associated nausea and vomiting for at least 4 days prior to the admission. She also has generalized malaise and fatigue.She did have gross hematuria following her bladder biopsy which subsequently resolved. She denies any dysuria.  I was not personally made aware of this patient overnight or updated status of her admission.  Past Medical History:  Diagnosis Date  . A-fib ([place])   . Abnormal Pap smear of vagina and vaginal HPV   . Arthritis    "bones ache" (01/23/2015)  . Cancer ([place])    vulvar cancer  . CHF (congestive heart failure) ([place])   . Chronic anticoagulation    She is on Xarelto for Afib  . Chronic atrial fibrillation ([place])   . Dyspnea   . Echocardiogram abnormal    Mitral regurgitation, tricuspic regurgitation  . Family history of adverse reaction to anesthesia    sister gets very nauseated and vomits  . GERD (gastroesophageal reflux disease)   . History of kidney stones   . History of stress test    Myoview 8/16: EF 42%, anteroseptal, inferoseptal, anterior, apical anterior and apical defect (likely represents breast attenuation versus scar), no ischemia; intermediate risk because of low EF  . HPV (human papilloma virus) anogenital infection    03/21/12 PAP + HR HPV  . Hypertension   . Insulin resistance   . Kidney stones   . Obesity   .  OSA on CPAP   . Postoperative nausea and vomiting 1983   many years ago with hysterectomy  . Urolithiasis   . Vulvar cancer ([place]) 2016   invasive verrucous carcinoma of the vulva. History of genital warts and vulvar condyloma    Past Surgical History:  Procedure Laterality Date  . ABDOMINAL HYSTERECTOMY    . APPENDECTOMY    . BILATERAL SALPINGOOPHORECTOMY  2003   benign ovarian cancer  . CARDIOVERSION  2014-2015   "[place]"  . COLONOSCOPY WITH PROPOFOL N/A 06/10/2015   Procedure: COLONOSCOPY WITH PROPOFOL;  Surgeon: Lucilla Lame, MD;  Location: [facility] ENDOSCOPY;  Service: Endoscopy;  Laterality: N/A;  . CYSTOSCOPY W/ RETROGRADES Bilateral 10/19/2016   Procedure: CYSTOSCOPY WITH RETROGRADE PYELOGRAM;  Surgeon: Hollice Espy, MD;  Location: [facility] ORS;  Service: Urology;  Laterality: Bilateral;  . CYSTOSCOPY WITH BIOPSY N/A 10/19/2016   Procedure: CYSTOSCOPY WITH BLADDER BIOPSY, VAGINAL WALL BIOPSY;  Surgeon: Hollice Espy, MD;  Location: [facility] ORS;  Service: Urology;  Laterality: N/A;  . ESOPHAGOGASTRODUODENOSCOPY (EGD) WITH PROPOFOL N/A 10/05/2016   Surgeon: Lucilla Lame, MD;  Location: [facility] ENDOSCOPY;  Results: Barrett's Esophagus- repeat in 3 years 09/2019  . LITHOTRIPSY    . TONSILLECTOMY    . VULVECTOMY Right 05/10/2014   Excisional biopsy of the superior right labial majora mass; [place] PARTIAL  07/02/2014   Re-excision and sentinel node dissection at [facility]     Home Medications:  Current  Facility-Administered Medications for the 12/13/16 encounter [facility] Encounter)  Medication  . albuterol (PROVENTIL) (2.5 MG/3ML) 0.083% nebulizer solution 2.5 mg   Current Meds  Medication Sig  . albuterol (PROVENTIL HFA;VENTOLIN HFA) 108 (90 Base) MCG/ACT inhaler Inhale 2 puffs into the lungs every 8 (eight) hours as needed for wheezing or shortness of breath.  . dicyclomine (BENTYL) 10 MG capsule Take 10 mg by mouth 4 (four) times daily -  before meals and  at bedtime.  . furosemide (LASIX) 40 MG tablet TAKE 2 TABLETS (80 MG TOTAL) BY MOUTH DAILY. (Patient taking differently: Take 80 mg by mouth every morning. )  . irbesartan (AVAPRO) 75 MG tablet Take 1 tablet (75 mg total) by mouth daily. (Patient taking differently: Take 37.5 mg by mouth daily. )  . metoprolol tartrate (LOPRESSOR) 100 MG tablet Take 150 mg by mouth 2 (two) times daily.  . mirabegron ER (MYRBETRIQ) 25 MG TB24 tablet Take 1 tablet (25 mg total) by mouth daily.  Marland Kitchen omeprazole (PRILOSEC) 20 MG capsule Take 20 mg by mouth daily.  Marland Kitchen oxybutynin (DITROPAN) 5 MG tablet Take 1 tablet (5 mg total) by mouth every 8 (eight) hours as needed for bladder spasms.  . rivaroxaban (XARELTO) 20 MG TABS tablet Take 20 mg by mouth daily with supper.  . sertraline (ZOLOFT) 25 MG tablet Take 25 mg by mouth daily.  . vitamin B-12 (CYANOCOBALAMIN) 1000 MCG tablet Take 1,000 mcg by mouth every other day.     Allergies:  Allergies  Allergen Reactions  . Ciprofloxacin Other (See Comments)    SOB    Family History  Problem Relation Age of Onset  . Prostate cancer Brother 51       still living and well  . Heart attack Father   . Stroke Mother   . Diabetes Mother   . Hypertension Mother   . Heart attack Brother   . Pancreatic cancer Brother   . Healthy Sister   . Breast cancer Neg Hx   . Kidney cancer Neg Hx   . Bladder Cancer Neg Hx     Social History:  reports that she quit smoking about 4 years ago. Her smoking use included Cigarettes and E-cigarettes. She has a 50.00 pack-year smoking history. She has never used smokeless tobacco. She reports that she does not drink alcohol or use drugs.  ROS: A complete review of systems was performed.  All systems are negative except for pertinent findings as noted.  Physical Exam:  Vital signs in last 24 hours: Temp:  [98.4 F (36.9 C)-100.5 F (38.1 C)] 100.5 F (38.1 C) (09/25 0431) Pulse Rate:  [100-131] 109 (09/25 0431) Resp:  [18-31] 18  (09/24 2220) BP: (105-140)/(69-87) 131/69 (09/25 0431) SpO2:  [82 %-97 %] 92 % (09/25 0431) Weight:  [210 lb (95.3 kg)-216 lb 8 oz (98.2 kg)] 215 lb 1.6 oz (97.6 kg) (09/25 0431) Constitutional:  Alert and oriented, No acute distress HEENT: [place] AT, moist mucus membranes.  Trachea midline, no masses Cardiovascular: Regular rate and rhythm, no clubbing, cyanosis, or edema. Respiratory: Normal respiratory effort, lungs clear bilaterally GI: Abdomen is soft, nontender, nondistended, no abdominal masses.  Obese.  GU: Mild left CVA tenderness with  abdominal tenderness in LLQ. Skin: No rashes, bruises or suspicious lesions Neurologic: Grossly intact, no focal deficits, moving all 4 extremities Psychiatric: Normal mood and affect   Laboratory Data:   Recent Labs  12/13/16 1324 12/14/16 0219  WBC 13.3* 12.9*  HGB 14.7 13.1  HCT 43.3  38.5    Recent Labs  12/13/16 1324 12/14/16 0219  NA 135 134*  K 3.3* 3.7  CL 94* 97*  CO2 29 30  GLUCOSE 153* 142*  BUN 16 15  CREATININE 0.95 0.97  CALCIUM 9.2 8.1*   No results for input(s): LABPT, INR in the last 72 hours. No results for input(s): LABURIN in the last 72 hours. Results for orders placed or performed during the hospital encounter of 10/11/16  Urine culture     Status: Abnormal   Collection Time: 10/11/16 12:35 PM  Result Value Ref Range Status   Specimen Description URINE, CLEAN CATCH  Final   Special Requests NONE  Final   Culture (A)  Final    <10,000 COLONIES/mL INSIGNIFICANT GROWTH Performed at [facility], [address], [place], [place] [address]    Report Status 10/13/2016 FINAL  Final    Component     Latest Ref Rng & Units 12/13/2016          Color, Urine     YELLOW AMBER (A)  Appearance     CLEAR TURBID (A)  Specific Gravity, Urine     1.005 - 1.030 1.020  pH     5.0 - 8.0 7.0  Glucose     NEGATIVE mg/dL NEGATIVE  Hgb urine dipstick     NEGATIVE SMALL (A)  Bilirubin Urine     NEGATIVE  NEGATIVE  Ketones, ur     NEGATIVE mg/dL NEGATIVE  Protein     NEGATIVE mg/dL 100 (A)  Nitrite     NEGATIVE NEGATIVE  Leukocytes, UA     NEGATIVE MODERATE (A)  RBC / HPF     0 - 5 RBC/hpf TOO NUMEROUS TO COUNT  WBC, UA     0 - 5 WBC/hpf TOO NUMEROUS TO COUNT  Bacteria, UA     NONE SEEN NONE SEEN  Squamous Epithelial / LPF     NONE SEEN 6-30 (A)  WBC Clumps      PRESENT  Mucus      PRESENT    Radiologic Imaging: Dg Chest 2 View  Result Date: 12/13/2016 CLINICAL DATA:  Shortness of breath EXAM: CHEST  2 VIEW COMPARISON:  01/21/2015 FINDINGS: Low lung volumes. The cardio pericardial silhouette is enlarged. The visualized bony structures of the thorax are intact. IMPRESSION: Low volume film without acute cardiopulmonary findings. Electronically Signed   By: Misty Stanley M.D.   On: 12/13/2016 19:49   Ct Abdomen Pelvis W Contrast  Result Date: 12/13/2016 CLINICAL DATA:  71 year old female with left abdominal and pelvic pain for 5 days with vomiting. History of vulvar cancer. EXAM: CT ABDOMEN AND PELVIS WITH CONTRAST TECHNIQUE: Multidetector CT imaging of the abdomen and pelvis was performed using the standard protocol following bolus administration of intravenous contrast. CONTRAST:  138mL ISOVUE-300 IOPAMIDOL (ISOVUE-300) INJECTION 61% COMPARISON:  01/28/2015 CT FINDINGS: Lower chest: No acute abnormality Hepatobiliary: Mild hepatic steatosis noted. Gallbladder is unremarkable. No biliary dilatation. Pancreas: Unremarkable Spleen: Unremarkable Adrenals/Urinary Tract: A 5 mm proximal left ureteral calculus causes moderate left hydronephrosis and perinephric inflammation. The right kidney, adrenal glands and bladder are unremarkable. Stomach/Bowel: No bowel obstruction, bowel wall thickening or inflammatory changes. Colonic diverticulosis noted without diverticulitis. A small to moderate hiatal hernia is present. The appendix is normal. Vascular/Lymphatic: Aortic atherosclerosis. No enlarged  abdominal or pelvic lymph nodes. Reproductive: Status post hysterectomy. No adnexal masses. Other: No ascites, focal collection or pneumoperitoneum. Musculoskeletal: No acute or significant osseous findings. IMPRESSION: 1.  Obstructing 5 mm proximal left ureteral calculus causing moderate left hydronephrosis. 2. Small-moderate hiatal hernia 3. Mild hepatic steatosis 4.  Aortic Atherosclerosis (ICD10-I70.0). Electronically Signed   By: Margarette Canada M.D.   On: 12/13/2016 17:11   CT scan imaging partial reviewed today.  Impression/plan:  1. 5 mm left proximal obstructing ureteral calculus with concern for sepsis (+ UA, elevated WBC, low grade temp, tachycardia)-  I have recommended urgent left ureteral stent placement given concern for infection.  Risk and benefits were carefully reviewed. She understands that this is a staged procedure will need to return at a later date to address her stone. All questions were answered. She is not currently nothing by mouth and has been taking sips of clear water, otherwise no food since yesterday.  Continue IV antibiotics, currently on ceftriaxone.  Adjust based on culture and sensitivity data  Supportive care per primary team  2. Hydronephrosis- secondary to #1  12/14/2016, 8:04 AM  Hollice Espy,  MD

## 2016-12-14 NOTE — Progress Notes (Signed)
Pt alert and oriented x4, no complaints of pain or discomfort.  Bed in low position, call bell within reach.  Bed alarms on and functioning.  Assessment done and charted.  Will continue to monitor and do hourly rounding throughout the shift  Pt leaving to go to have a stent put in at the procedure area.

## 2016-12-14 NOTE — Op Note (Signed)
Date of procedure: 12/14/16  Preoperative diagnosis:  1. Left obstructing ureteral calculus 2.  UTI   Postoperative diagnosis:  1. Same as above   Procedure: 1. Cystoscopy 2.  left retrograde pyelogram 3. Left ureteral stent placement  Surgeon: Hollice Espy, MD  Anesthesia: General  Complications: None  Intraoperative findings: left stent placed without difficulty.Renal pelvic fullness noted on retrograde pyelogram.  EBL: medical  Specimens: none  Drains: 6 x 24 French double-J ureteral stent on left  Indication: Holly Smith is a 71 y.o. patient with with an obstructing 5 mm proximal ureteral calculus, UTI, low-grade temp concerning for early sepsis.  After reviewing the management options for treatment, she elected to proceed with the above surgical procedure(s). We have discussed the potential benefits and risks of the procedure, side effects of the proposed treatment, the likelihood of the patient achieving the goals of the procedure, and any potential problems that might occur during the procedure or recuperation. Informed consent has been obtained.  Description of procedure:  The patient was taken to the operating room and general anesthesia was induced.  The patient was placed in the dorsal lithotomy position, prepped and draped in the usual sterile fashion, and preoperative antibiotics were administered. A preoperative time-out was performed.   A 21 French scope was advanced per urethra into the bladder. The bladder was inspected and noted to be mildly erythematous with patches of healing scar from previous bladder biopsy. Return was somewhat cloudy. Attention was then turned to the left ureteral orifice which was cannulated using 5 French open ended ureteral catheter. Very gentle RTG performed to outline the collecting system which revealed a mostly decompressed ureter with dilation of the renal pelvis and proximal ureter consistent with known stone.Wire was then  placed up to level of the renal pelvis without difficulty by the stone.  The stent was then passed along the wire up to level of the renal pelvis. The wire was partially drawn until full coil was noted within the renal pelvis. Wire was then fully withdrawn and full coil was noted within the bladder. The bladder was then drained. She was then reversed from anesthesia and taken to the PACU in stable condition.    Plan: Patient will continue IV antibiotics which will be adjusted based on her culture and sensitivity data. Continue supportive care. She will need to be discharged on 7-10 days of oral antibiotics. We will plan for outpatient follow-up to arrange for definitive management of her stone.  Hollice Espy, M.D.

## 2016-12-14 NOTE — Progress Notes (Signed)
Pt back to room .  Site on abd noted to be clean and dry

## 2016-12-14 NOTE — Anesthesia Procedure Notes (Signed)
Procedure Name: Intubation Date/Time: 12/14/2016 8:53 AM Performed by: Justus Memory Pre-anesthesia Checklist: Patient identified, Patient being monitored, Timeout performed, Emergency Drugs available and Suction available Patient Re-evaluated:Patient Re-evaluated prior to induction Oxygen Delivery Method: Circle system utilized Preoxygenation: Pre-oxygenation with 100% oxygen Induction Type: IV induction Ventilation: Mask ventilation without difficulty Laryngoscope Size: Mac and 3 Grade View: Grade II Tube type: Oral Tube size: 7.0 mm Number of attempts: 1 Airway Equipment and Method: Stylet Placement Confirmation: ETT inserted through vocal cords under direct vision,  positive ETCO2 and breath sounds checked- equal and bilateral Secured at: 21 cm Tube secured with: Tape Dental Injury: Teeth and Oropharynx as per pre-operative assessment

## 2016-12-14 NOTE — Transfer of Care (Signed)
Immediate Anesthesia Transfer of Care Note  Patient: Holly Smith  Procedure(s) Performed: Procedure(s): [place] (Left)  Patient Location: PACU  Anesthesia Type:General  Level of Consciousness: sedated  Airway & Oxygen Therapy: Patient Spontanous Breathing and Patient connected to face mask oxygen  Post-op Assessment: Report given to RN and Post -op Vital signs reviewed and stable  Post vital signs: Reviewed and stable  Last Vitals:  Vitals:   12/14/16 0945 12/14/16 0949  BP:  113/61  Pulse: (!) 125   Resp: (!) 33 (!) 34  Temp:    SpO2: 100%     Last Pain:  Vitals:   12/14/16 0816  TempSrc: Oral  PainSc: 0-No pain         Complications: tachypneic shallow breathing MD aware

## 2016-12-14 NOTE — Progress Notes (Signed)
Dr. Ronelle Nigh back In to evaluate patient, resp. Also Back at bedside to place oxygen into cpap machine. Patient arouses easily to voice.  Denies any pain.

## 2016-12-14 NOTE — Progress Notes (Signed)
Awaiting Resp. To come and connect cpap for patient.

## 2016-12-14 NOTE — Progress Notes (Signed)
Resp. Here to place cpap back on pt.

## 2016-12-14 NOTE — Anesthesia Postprocedure Evaluation (Signed)
Anesthesia Post Note  Patient: Holly Smith  Procedure(s) Performed: Procedure(s) (LRB): CYSTOSCOPY WITH STENT PLACEMENT (Left)  Patient location during evaluation: PACU Anesthesia Type: General Level of consciousness: awake and alert Pain management: pain level controlled Vital Signs Assessment: post-procedure vital signs reviewed and stable Respiratory status: spontaneous breathing and respiratory function stable Cardiovascular status: stable Anesthetic complications: no     Last Vitals:  Vitals:   12/14/16 1015 12/14/16 1019  BP:  116/78  Pulse: (!) 128 (!) 122  Resp: (!) 33 (!) 29  Temp:    SpO2: 92% 93%    Last Pain:  Vitals:   12/14/16 0816  TempSrc: Oral  PainSc: 0-No pain                 KEPHART,WILLIAM K

## 2016-12-14 NOTE — Progress Notes (Signed)
[facility] [place] NAME: Danielly Ackerley    MR#:  [id number]  DATE OF BIRTH:  [birth year]  SUBJECTIVE:  CHIEF COMPLAINT:  Pt was seen and evaluated after cystoscopy and stent placement. Denies any pain.  REVIEW OF SYSTEMS:  CONSTITUTIONAL: No fever, fatigue or weakness.  EYES: No blurred or double vision.  EARS, NOSE, AND THROAT: No tinnitus or ear pain.  RESPIRATORY: No cough, shortness of breath, wheezing or hemoptysis.  CARDIOVASCULAR: No chest pain, orthopnea, edema.  GASTROINTESTINAL: No nausea, vomiting, diarrhea or abdominal pain.  GENITOURINARY: No dysuria, hematuria.  ENDOCRINE: No polyuria, nocturia,  HEMATOLOGY: No anemia, easy bruising or bleeding SKIN: No rash or lesion. MUSCULOSKELETAL: No joint pain or arthritis.   NEUROLOGIC: No tingling, numbness, weakness.  PSYCHIATRY: No anxiety or depression.   DRUG ALLERGIES:   Allergies  Allergen Reactions  . Ciprofloxacin Other (See Comments)    SOB    VITALS:  Blood pressure 106/78, pulse (!) 122, temperature 98 F (36.7 C), temperature source Oral, resp. rate 19, height 5\' 1"  (1.549 m), weight 97.6 kg (215 lb 1.6 oz), SpO2 97 %.  PHYSICAL EXAMINATION:  GENERAL:  71 y.o.-year-old patient lying in the bed with no acute distress.  EYES: Pupils equal, round, reactive to light and accommodation. No scleral icterus. Extraocular muscles intact.  HEENT: Head atraumatic, normocephalic. Oropharynx and nasopharynx clear.  NECK:  Supple, no jugular venous distention. No thyroid enlargement, no tenderness.  LUNGS: Normal breath sounds bilaterally, no wheezing, rales,rhonchi or crepitation. No use of accessory muscles of respiration.CPAP discontinued  CARDIOVASCULAR: S1, S2 normal. No murmurs, rubs, or gallops.  ABDOMEN: Soft, nontender, nondistended. Bowel sounds present. No organomegaly or mass.  EXTREMITIES: No pedal edema, cyanosis, or clubbing.  NEUROLOGIC: Cranial nerves II  through XII are intact. Muscle strength 5/5 in all extremities. Sensation intact. Gait not checked.  PSYCHIATRIC: The patient is alert and oriented x 3.  SKIN: No obvious rash, lesion, or ulcer.    LABORATORY PANEL:   CBC  Recent Labs Lab 12/14/16 0219  WBC 12.9*  HGB 13.1  HCT 38.5  PLT 217   ------------------------------------------------------------------------------------------------------------------  Chemistries   Recent Labs Lab 12/13/16 1324 12/14/16 0219  NA 135 134*  K 3.3* 3.7  CL 94* 97*  CO2 29 30  GLUCOSE 153* 142*  BUN 16 15  CREATININE 0.95 0.97  CALCIUM 9.2 8.1*  AST 17  --   ALT 11*  --   ALKPHOS 67  --   BILITOT 0.9  --    ------------------------------------------------------------------------------------------------------------------  Cardiac Enzymes No results for input(s): TROPONINI in the last 168 hours. ------------------------------------------------------------------------------------------------------------------  RADIOLOGY:  Dg Chest 2 View  Result Date: 12/13/2016 CLINICAL DATA:  Shortness of breath EXAM: CHEST  2 VIEW COMPARISON:  01/21/2015 FINDINGS: Low lung volumes. The cardio pericardial silhouette is enlarged. The visualized bony structures of the thorax are intact. IMPRESSION: Low volume film without acute cardiopulmonary findings. Electronically Signed   By: Misty Stanley M.D.   On: 12/13/2016 19:49   Ct Abdomen Pelvis W Contrast  Result Date: 12/13/2016 CLINICAL DATA:  71 year old female with left abdominal and pelvic pain for 5 days with vomiting. History of vulvar cancer. EXAM: CT ABDOMEN AND PELVIS WITH CONTRAST TECHNIQUE: Multidetector CT imaging of the abdomen and pelvis was performed using the standard protocol following bolus administration of intravenous contrast. CONTRAST:  159mL ISOVUE-300 IOPAMIDOL (ISOVUE-300) INJECTION 61% COMPARISON:  01/28/2015 CT FINDINGS: Lower chest: No acute abnormality Hepatobiliary: Mild  hepatic steatosis noted. Gallbladder is unremarkable. No biliary dilatation. Pancreas: Unremarkable Spleen: Unremarkable Adrenals/Urinary Tract: A 5 mm proximal left ureteral calculus causes moderate left hydronephrosis and perinephric inflammation. The right kidney, adrenal glands and bladder are unremarkable. Stomach/Bowel: No bowel obstruction, bowel wall thickening or inflammatory changes. Colonic diverticulosis noted without diverticulitis. A small to moderate hiatal hernia is present. The appendix is normal. Vascular/Lymphatic: Aortic atherosclerosis. No enlarged abdominal or pelvic lymph nodes. Reproductive: Status post hysterectomy. No adnexal masses. Other: No ascites, focal collection or pneumoperitoneum. Musculoskeletal: No acute or significant osseous findings. IMPRESSION: 1. Obstructing 5 mm proximal left ureteral calculus causing moderate left hydronephrosis. 2. Small-moderate hiatal hernia 3. Mild hepatic steatosis 4.  Aortic Atherosclerosis (ICD10-I70.0). Electronically Signed   By: Margarette Canada M.D.   On: 12/13/2016 17:11    EKG:   Orders placed or performed during the hospital encounter of 12/13/16  . ED EKG  . ED EKG    ASSESSMENT AND PLAN:    * sepsis from Left ureteral stone with hydronephrosis and UTI.  Sepsis present on admission. Continue patient on IV fluids Pain meds as needed Continue Ceftriaxone.  Urology consulted ,patient had cystont placement today tolerated procedure well Follow-up urine culture and blood cultures.  * Acute hypoxic respiratory failure likely due to pain and poor inspiratory effort. Chest x-ray clear.  incentive spirometer. She did receive 1 dose of IV Lasix in the emergency room but I doubt this is congestive heart failure. Has no lower extremity edema or JVD or pulmonary edema on chest x-ray. CPAP at night. Wean oxygen as tolerated.  * Atrial fibrillation with rapid ventricular rate due to sepsis. Presently heart rate in the 120s. We'll  continue home medications at this time. Should improve with fluids and infection getting better.  Add IV Lopressor. Will resume  Xarelto from tomorrow if ok with urology  * Hypokalemia. Replace orally.  * DVT prophylaxis with Lovenox    All the records are reviewed and case discussed with Care Management/Social Workerr. Management plans discussed with the patient, family-son and daughter-in-law over phone and they are in agreement.  CODE STATUS: fc   TOTAL TIME TAKING CARE OF THIS PATIENT: 36  minutes.   POSSIBLE D/C IN 2  DAYS, DEPENDING ON CLINICAL CONDITION.  Note: This dictation was prepared with Dragon dictation along with smaller phrase technology. Any transcriptional errors that result from this process are unintentional.   Nicholes Mango M.D on 12/14/2016 at 2:52 PM  Between 7am to 6pm - Pager - [phone] After 6pm go to [email or web address] - password [email or web address] [facility]  Office  [phone]  CC: Primary care physician; Juline Patch, MD

## 2016-12-15 ENCOUNTER — Inpatient Hospital Stay: Payer: Medicare Other

## 2016-12-15 ENCOUNTER — Telehealth: Payer: Self-pay | Admitting: Urology

## 2016-12-15 LAB — CBC
HEMATOCRIT: 35 % (ref 35.0–47.0)
HEMOGLOBIN: 11.6 g/dL — AB (ref 12.0–16.0)
MCH: 30.2 pg (ref 26.0–34.0)
MCHC: 33.3 g/dL (ref 32.0–36.0)
MCV: 90.5 fL (ref 80.0–100.0)
Platelets: 175 10*3/uL (ref 150–440)
RBC: 3.86 MIL/uL (ref 3.80–5.20)
RDW: 14.2 % (ref 11.5–14.5)
WBC: 11.3 10*3/uL — ABNORMAL HIGH (ref 3.6–11.0)

## 2016-12-15 LAB — BASIC METABOLIC PANEL
Anion gap: 7 (ref 5–15)
BUN: 12 mg/dL (ref 6–20)
CALCIUM: 8.6 mg/dL — AB (ref 8.9–10.3)
CHLORIDE: 102 mmol/L (ref 101–111)
CO2: 28 mmol/L (ref 22–32)
CREATININE: 0.77 mg/dL (ref 0.44–1.00)
GFR calc non Af Amer: >60 mL/min (ref >60)
Glucose, Bld: 144 mg/dL — ABNORMAL HIGH (ref 65–99)
Potassium: 3.5 mmol/L (ref 3.5–5.1)
SODIUM: 137 mmol/L (ref 135–145)

## 2016-12-15 MED ORDER — IOPAMIDOL (ISOVUE-370) INJECTION 76%
75.0000 mL | Freq: Once | INTRAVENOUS | Status: AC | PRN
  Administered 2016-12-15: 75 mL via INTRAVENOUS

## 2016-12-15 NOTE — Telephone Encounter (Signed)
Patient still in hospital, but she will need a follow up appointment to discuss stone management in 1 to 2 weeks.

## 2016-12-15 NOTE — Telephone Encounter (Signed)
App made ° ° °Michelle  °

## 2016-12-15 NOTE — Progress Notes (Signed)
[facility] [place] NAME: Holly Smith    MR#:  [id number]  DATE OF BIRTH:  [birth year]  SUBJECTIVE:  CHIEF COMPLAINT:  Pt Is resting comfortably but becoming hypoxemic to 85% on room air requiring 3 L of oxygen. Discussed with son Mr. Lynnae Sandhoff, concerned about his mom's progressively worsening shortness of breath  REVIEW OF SYSTEMS:  CONSTITUTIONAL: No fever, fatigue or weakness.  EYES: No blurred or double vision.  EARS, NOSE, AND THROAT: No tinnitus or ear pain.  RESPIRATORY: No cough, shortness of breath With exertion , no wheezing or hemoptysis.  CARDIOVASCULAR: No chest pain, orthopnea, edema.  GASTROINTESTINAL: No nausea, vomiting, diarrhea or abdominal pain.  GENITOURINARY: No dysuria, hematuria.  ENDOCRINE: No polyuria, nocturia,  HEMATOLOGY: No anemia, easy bruising or bleeding SKIN: No rash or lesion. MUSCULOSKELETAL: No joint pain or arthritis.   NEUROLOGIC: No tingling, numbness, weakness.  PSYCHIATRY: No anxiety or depression.   DRUG ALLERGIES:   Allergies  Allergen Reactions  . Ciprofloxacin Other (See Comments)    SOB    VITALS:  Blood pressure 108/70, pulse 90, temperature 98.3 F (36.8 C), temperature source Oral, resp. rate 20, height 5\' 1"  (1.549 m), weight 97.3 kg (214 lb 6.4 oz), SpO2 100 %.  PHYSICAL EXAMINATION:  GENERAL:  71 y.o.-year-old patient lying in the bed with no acute distress.  EYES: Pupils equal, round, reactive to light and accommodation. No scleral icterus. Extraocular muscles intact.  HEENT: Head atraumatic, normocephalic. Oropharynx and nasopharynx clear.  NECK:  Supple, no jugular venous distention. No thyroid enlargement, no tenderness.  LUNGS: Moderate  breath sounds bilaterally, no wheezing, rales,rhonchi or crepitation. No use of accessory muscles of respiration.CPAP discontinued  CARDIOVASCULAR: S1, S2 normal. No murmurs, rubs, or gallops.  ABDOMEN: Soft, nontender, nondistended.  Bowel sounds present. No organomegaly or mass.  EXTREMITIES: No pedal edema, cyanosis, or clubbing.  NEUROLOGIC: Cranial nerves II through XII are intact. Muscle strength 5/5 in all extremities. Sensation intact. Gait not checked.  PSYCHIATRIC: The patient is alert and oriented x 3.  SKIN: No obvious rash, lesion, or ulcer.    LABORATORY PANEL:   CBC  Recent Labs Lab 12/15/16 0545  WBC 11.3*  HGB 11.6*  HCT 35.0  PLT 175   ------------------------------------------------------------------------------------------------------------------  Chemistries   Recent Labs Lab 12/13/16 1324  12/15/16 0545  NA 135  < > 137  K 3.3*  < > 3.5  CL 94*  < > 102  CO2 29  < > 28  GLUCOSE 153*  < > 144*  BUN 16  < > 12  CREATININE 0.95  < > 0.77  CALCIUM 9.2  < > 8.6*  AST 17  --   --   ALT 11*  --   --   ALKPHOS 67  --   --   BILITOT 0.9  --   --   < > = values in this interval not displayed. ------------------------------------------------------------------------------------------------------------------  Cardiac Enzymes No results for input(s): TROPONINI in the last 168 hours. ------------------------------------------------------------------------------------------------------------------  RADIOLOGY:  Dg Chest 2 View  Result Date: 12/13/2016 CLINICAL DATA:  Shortness of breath EXAM: CHEST  2 VIEW COMPARISON:  01/21/2015 FINDINGS: Low lung volumes. The cardio pericardial silhouette is enlarged. The visualized bony structures of the thorax are intact. IMPRESSION: Low volume film without acute cardiopulmonary findings. Electronically Signed   By: Misty Stanley M.D.   On: 12/13/2016 19:49   Ct Abdomen Pelvis W Contrast  Result Date: 12/13/2016 CLINICAL  DATA:  71 year old female with left abdominal and pelvic pain for 5 days with vomiting. History of vulvar cancer. EXAM: CT ABDOMEN AND PELVIS WITH CONTRAST TECHNIQUE: Multidetector CT imaging of the abdomen and pelvis was performed  using the standard protocol following bolus administration of intravenous contrast. CONTRAST:  169mL ISOVUE-300 IOPAMIDOL (ISOVUE-300) INJECTION 61% COMPARISON:  01/28/2015 CT FINDINGS: Lower chest: No acute abnormality Hepatobiliary: Mild hepatic steatosis noted. Gallbladder is unremarkable. No biliary dilatation. Pancreas: Unremarkable Spleen: Unremarkable Adrenals/Urinary Tract: A 5 mm proximal left ureteral calculus causes moderate left hydronephrosis and perinephric inflammation. The right kidney, adrenal glands and bladder are unremarkable. Stomach/Bowel: No bowel obstruction, bowel wall thickening or inflammatory changes. Colonic diverticulosis noted without diverticulitis. A small to moderate hiatal hernia is present. The appendix is normal. Vascular/Lymphatic: Aortic atherosclerosis. No enlarged abdominal or pelvic lymph nodes. Reproductive: Status post hysterectomy. No adnexal masses. Other: No ascites, focal collection or pneumoperitoneum. Musculoskeletal: No acute or significant osseous findings. IMPRESSION: 1. Obstructing 5 mm proximal left ureteral calculus causing moderate left hydronephrosis. 2. Small-moderate hiatal hernia 3. Mild hepatic steatosis 4.  Aortic Atherosclerosis (ICD10-I70.0). Electronically Signed   By: Margarette Canada M.D.   On: 12/13/2016 17:11    EKG:   Orders placed or performed during the hospital encounter of 12/13/16  . ED EKG  . ED EKG    ASSESSMENT AND PLAN:    * sepsis from Left ureteral stone with hydronephrosis and UTI.  Sepsis present on admission. Continue patient on IV fluids Pain meds as needed Continue Ceftriaxone.  Urology consulted ,patient had cystont placement today tolerated procedure well Follow-up urine culture With  100,000 colonies of gram-negative bacteria and blood cultures with no growth  * Acute hypoxic respiratory failure likely due to pain and poor inspiratory effort. Chest x-ray clear.  incentive spirometer. We do CT angiogram of the  chest to rule out pulmonary embolism  Pulmonology consult for worsening of shortness of breath  She did receive 1 dose of IV Lasix in the emergency room but I doubt this is congestive heart failure. Has no lower extremity edema or JVD or pulmonary edema on chest x-ray. CPAP at night. Wean oxygen as tolerated.  * Atrial fibrillation with rapid ventricular rate due to sepsis. Presently heart rate in the 120s. We'll continue home medications at this time. Should improve with fluids and infection getting better.  Add IV Lopressor. Will resume  Xarelto from tomorrow if ok with urology  * Hypokalemia. Replace orally.  * DVT prophylaxis with Lovenox    All the records are reviewed and case discussed with Care Management/Social Workerr. Management plans discussed with the patient, family-son Mr. Lynnae Sandhoff  over phone and they are in agreement.  CODE STATUS: fc   TOTAL TIME TAKING CARE OF THIS PATIENT: 36  minutes.   POSSIBLE D/C IN 2  DAYS, DEPENDING ON CLINICAL CONDITION.  Note: This dictation was prepared with Dragon dictation along with smaller phrase technology. Any transcriptional errors that result from this process are unintentional.   Nicholes Mango M.D on 12/15/2016 at 11:46 AM  Between 7am to 6pm - Pager - [phone] After 6pm go to [email or web address] - password [email or web address] [facility]  Office  [phone]  CC: Primary care physician; Juline Patch, MD

## 2016-12-15 NOTE — Progress Notes (Signed)
Urology Consult Follow Up  Subjective: POD # 1  S/p left ureteral stent placement for obstructing calculus.  Patient without complaint this a.m.  Afebrile, tachy -but has a history of afib  Cr 0.77 down from 0.97   WBC  11.3 down from 12.9  Urine has been clear per patient  Anti-infectives: Anti-infectives    Start     Dose/Rate Route Frequency Ordered Stop   12/14/16 2100  cefTRIAXone (ROCEPHIN) 1 g in dextrose 5 % 50 mL IVPB - Premix  Status:  Discontinued     1 g 100 mL/hr over 30 Minutes Intravenous Every 24 hours 12/13/16 2058 12/14/16 0824   12/14/16 1800  cefTRIAXone (ROCEPHIN) 1 g in dextrose 5 % 50 mL IVPB     1 g 120 mL/hr over 30 Minutes Intravenous Every 24 hours 12/14/16 0824     12/13/16 2030  cefTRIAXone (ROCEPHIN) 1 g in dextrose 5 % 50 mL IVPB - Premix     1 g 100 mL/hr over 30 Minutes Intravenous  Once 12/13/16 2025 12/13/16 2130      Current Facility-Administered Medications  Medication Dose Route Frequency Provider Last Rate Last Dose  . acetaminophen (TYLENOL) tablet 650 mg  650 mg Oral Q6H PRN Hillary Bow, MD   650 mg at 12/14/16 2038   Or  . acetaminophen (TYLENOL) suppository 650 mg  650 mg Rectal Q6H PRN Sudini, Alveta Heimlich, MD      . albuterol (PROVENTIL) (2.5 MG/3ML) 0.083% nebulizer solution 2.5 mg  2.5 mg Nebulization Q2H PRN Sudini, Srikar, MD      . cefTRIAXone (ROCEPHIN) 1 g in dextrose 5 % 50 mL IVPB  1 g Intravenous Q24H Hillary Bow, MD   Stopped at 12/14/16 1855  . dicyclomine (BENTYL) capsule 10 mg  10 mg Oral TID AC & HS Hillary Bow, MD   10 mg at 12/14/16 2152  . enoxaparin (LOVENOX) injection 40 mg  40 mg Subcutaneous Q24H Hillary Bow, MD   40 mg at 12/14/16 2152  . HYDROcodone-acetaminophen (NORCO/VICODIN) 5-325 MG per tablet 1 tablet  1 tablet Oral Q4H PRN Sudini, Srikar, MD      . Influenza vac split quadrivalent PF (FLUZONE HIGH-DOSE) injection 0.5 mL  0.5 mL Intramuscular Tomorrow-1000 Gouru, Aruna, MD      . iopamidol (ISOVUE-300) 61  % injection 30 mL  30 mL Oral Once PRN Arta Silence, MD      . metoprolol tartrate (LOPRESSOR) injection 5 mg  5 mg Intravenous Q4H PRN Sudini, Srikar, MD      . metoprolol tartrate (LOPRESSOR) tablet 100 mg  100 mg Oral BID Hillary Bow, MD   100 mg at 12/14/16 2152  . mirabegron ER (MYRBETRIQ) tablet 25 mg  25 mg Oral Daily Hillary Bow, MD   25 mg at 12/14/16 1114  . morphine 2 MG/ML injection 2 mg  2 mg Intravenous Q4H PRN Hillary Bow, MD   2 mg at 12/14/16 1620  . ondansetron (ZOFRAN) tablet 4 mg  4 mg Oral Q6H PRN Hillary Bow, MD       Or  . ondansetron (ZOFRAN) injection 4 mg  4 mg Intravenous Q6H PRN Hillary Bow, MD   4 mg at 12/14/16 1618  . pantoprazole (PROTONIX) EC tablet 40 mg  40 mg Oral Daily Hillary Bow, MD   40 mg at 12/14/16 1112  . polyethylene glycol (MIRALAX / GLYCOLAX) packet 17 g  17 g Oral Daily PRN Hillary Bow, MD      . sertraline (  ZOLOFT) tablet 25 mg  25 mg Oral Daily Hillary Bow, MD   25 mg at 12/14/16 1113  . sodium chloride flush (NS) 0.9 % injection 3 mL  3 mL Intravenous Q12H Hillary Bow, MD   3 mL at 12/14/16 2153     Objective: Vital signs in last 24 hours: Temp:  [97.6 F (36.4 C)-99.5 F (37.5 C)] 97.7 F (36.5 C) (09/26 0740) Pulse Rate:  [100-131] 106 (09/26 0740) Resp:  [18-35] 22 (09/26 0740) BP: (92-132)/(52-83) 132/70 (09/26 0740) SpO2:  [90 %-100 %] 97 % (09/26 0740) Weight:  [214 lb 6.4 oz (97.3 kg)] 214 lb 6.4 oz (97.3 kg) (09/26 0500)  Intake/Output from previous day: 09/25 0701 - 09/26 0700 In: 2296.3 [P.O.:480; I.V.:1756.3; IV Piggyback:60] Out: 505 [Urine:500; Blood:5] Intake/Output this shift: No intake/output data recorded.   Physical Exam Constitutional: Well nourished. Alert and oriented, No acute distress. HEENT:  AT, moist mucus membranes. Trachea midline, no masses. Cardiovascular: No clubbing, cyanosis, or edema. Respiratory: Normal respiratory effort, no increased work of  breathing. GI: Abdomen is soft, non tender, non distended, no abdominal masses. Liver and spleen not palpable.  No hernias appreciated.  Stool sample for occult testing is not indicated.   GU: No CVA tenderness.  No bladder fullness or masses.  Skin: No rashes, bruises or suspicious lesions. Lymph: No cervical or inguinal adenopathy. Neurologic: Grossly intact, no focal deficits, moving all 4 extremities. Psychiatric: Normal mood and affect.  Lab Results:   Recent Labs  12/14/16 0219 12/15/16 0545  WBC 12.9* 11.3*  HGB 13.1 11.6*  HCT 38.5 35.0  PLT 217 175   BMET  Recent Labs  12/14/16 0219 12/15/16 0545  NA 134* 137  K 3.7 3.5  CL 97* 102  CO2 30 28  GLUCOSE 142* 144*  BUN 15 12  CREATININE 0.97 0.77  CALCIUM 8.1* 8.6*   PT/INR No results for input(s): LABPROT, INR in the last 72 hours. ABG No results for input(s): PHART, HCO3 in the last 72 hours.  Invalid input(s): PCO2, PO2  Studies/Results: Dg Chest 2 View  Result Date: 12/13/2016 CLINICAL DATA:  Shortness of breath EXAM: CHEST  2 VIEW COMPARISON:  01/21/2015 FINDINGS: Low lung volumes. The cardio pericardial silhouette is enlarged. The visualized bony structures of the thorax are intact. IMPRESSION: Low volume film without acute cardiopulmonary findings. Electronically Signed   By: Misty Stanley M.D.   On: 12/13/2016 19:49   Ct Abdomen Pelvis W Contrast  Result Date: 12/13/2016 CLINICAL DATA:  71 year old female with left abdominal and pelvic pain for 5 days with vomiting. History of vulvar cancer. EXAM: CT ABDOMEN AND PELVIS WITH CONTRAST TECHNIQUE: Multidetector CT imaging of the abdomen and pelvis was performed using the standard protocol following bolus administration of intravenous contrast. CONTRAST:  120mL ISOVUE-300 IOPAMIDOL (ISOVUE-300) INJECTION 61% COMPARISON:  01/28/2015 CT FINDINGS: Lower chest: No acute abnormality Hepatobiliary: Mild hepatic steatosis noted. Gallbladder is unremarkable. No  biliary dilatation. Pancreas: Unremarkable Spleen: Unremarkable Adrenals/Urinary Tract: A 5 mm proximal left ureteral calculus causes moderate left hydronephrosis and perinephric inflammation. The right kidney, adrenal glands and bladder are unremarkable. Stomach/Bowel: No bowel obstruction, bowel wall thickening or inflammatory changes. Colonic diverticulosis noted without diverticulitis. A small to moderate hiatal hernia is present. The appendix is normal. Vascular/Lymphatic: Aortic atherosclerosis. No enlarged abdominal or pelvic lymph nodes. Reproductive: Status post hysterectomy. No adnexal masses. Other: No ascites, focal collection or pneumoperitoneum. Musculoskeletal: No acute or significant osseous findings. IMPRESSION: 1. Obstructing 5 mm proximal left  ureteral calculus causing moderate left hydronephrosis. 2. Small-moderate hiatal hernia 3. Mild hepatic steatosis 4.  Aortic Atherosclerosis (ICD10-I70.0). Electronically Signed   By: Margarette Canada M.D.   On: 12/13/2016 17:11     Assessment and Plan s/p Procedure(s): CYSTOSCOPY WITH STENT PLACEMENT  1. 5 mm left proximal obstructing ureteral calculus with concern for sepsis (+ UA, elevated WBC, low grade temp, tachycardia)-   - s/p left ureteral stent placement - currently afebrile, Cr and WBC improved  - Will need to be discharged on 7-10 days of oral antibiotics - urine culture is still pending  - Continue IV antibiotics, currently on ceftriaxone.  Adjust based on culture and sensitivity data  - will plan for outpatient follow-up to arrange for definitive management of her stone.  Supportive care per primary team  2. Hydronephrosis- secondary to #1    LOS: 2 days    [facility] 12/15/2016

## 2016-12-16 LAB — BASIC METABOLIC PANEL
Anion gap: 6 (ref 5–15)
BUN: 11 mg/dL (ref 6–20)
CHLORIDE: 102 mmol/L (ref 101–111)
CO2: 30 mmol/L (ref 22–32)
Calcium: 8.8 mg/dL — ABNORMAL LOW (ref 8.9–10.3)
Creatinine, Ser: 0.89 mg/dL (ref 0.44–1.00)
GFR calc Af Amer: >60 mL/min (ref >60)
GFR calc non Af Amer: >60 mL/min (ref >60)
GLUCOSE: 135 mg/dL — AB (ref 65–99)
POTASSIUM: 3.6 mmol/L (ref 3.5–5.1)
Sodium: 138 mmol/L (ref 135–145)

## 2016-12-16 LAB — CBC WITH DIFFERENTIAL/PLATELET
Basophils Absolute: 0 10*3/uL (ref 0–0.1)
Basophils Relative: 0 %
Eosinophils Absolute: 0.1 10*3/uL (ref 0–0.7)
Eosinophils Relative: 1 %
HCT: 33.1 % — ABNORMAL LOW (ref 35.0–47.0)
HEMOGLOBIN: 11.4 g/dL — AB (ref 12.0–16.0)
LYMPHS ABS: 1.2 10*3/uL (ref 1.0–3.6)
LYMPHS PCT: 14 %
MCH: 31.3 pg (ref 26.0–34.0)
MCHC: 34.4 g/dL (ref 32.0–36.0)
MCV: 91 fL (ref 80.0–100.0)
MONOS PCT: 15 %
Monocytes Absolute: 1.3 10*3/uL — ABNORMAL HIGH (ref 0.2–0.9)
NEUTROS PCT: 70 %
Neutro Abs: 6.1 10*3/uL (ref 1.4–6.5)
Platelets: 173 10*3/uL (ref 150–440)
RBC: 3.64 MIL/uL — AB (ref 3.80–5.20)
RDW: 14.2 % (ref 11.5–14.5)
WBC: 8.7 10*3/uL (ref 3.6–11.0)

## 2016-12-16 LAB — HEMOGLOBIN A1C
Hgb A1c MFr Bld: 6.6 % — ABNORMAL HIGH (ref 4.8–5.6)
Mean Plasma Glucose: 142.72 mg/dL

## 2016-12-16 LAB — URINE CULTURE

## 2016-12-16 MED ORDER — OXYCODONE HCL 5 MG PO TABS: 5.0000 mg | ORAL_TABLET | Freq: Four times a day (QID) | ORAL | Status: DC | PRN

## 2016-12-16 MED ORDER — INFLUENZA VAC SPLIT HIGH-DOSE 0.5 ML IM SUSY: 0.5000 mL | PREFILLED_SYRINGE | INTRAMUSCULAR | 0 refills | Status: AC

## 2016-12-16 MED ORDER — ACETAMINOPHEN 325 MG PO TABS: 650.0000 mg | ORAL_TABLET | Freq: Four times a day (QID) | ORAL | Status: DC | PRN

## 2016-12-16 MED ORDER — OXYCODONE HCL 5 MG PO TABS: 5.0000 mg | ORAL_TABLET | Freq: Four times a day (QID) | ORAL | 0 refills | Status: DC | PRN

## 2016-12-16 MED ORDER — CEPHALEXIN 500 MG PO CAPS: 500.0000 mg | ORAL_CAPSULE | Freq: Three times a day (TID) | ORAL | 0 refills | Status: DC

## 2016-12-16 NOTE — Care Management (Signed)
No discharge needs identified by members of the care team 

## 2016-12-16 NOTE — Progress Notes (Signed)
Patient off of CPAP and expressing need to go to bathroom.  Took her to [facility] on [facility] and checked 02 saturations.  They remained above 92%.   Patient asked whether she will need 02 at home and I explained she would need to qualify for 02.  At rest she does not qualify.  We will ambulate her later and see if she meets criteria.

## 2016-12-16 NOTE — Plan of Care (Signed)
Problem: Pain Managment: Goal: General experience of comfort will improve Outcome: Progressing Pt complained of a headache once, treated with tylenol, which gave relief. Pt still on 2L O2 which is acute, and wearing the CPAP at night. Pt had BM this shift.

## 2016-12-16 NOTE — Progress Notes (Signed)
Patient alert and oriented, vss, on room air.  Patient ambulated around nursing station with no complaints of SOB.  D/C telemetry and d/c PIV.  Patient to be escorted out of hospital via wheelchair by volunteers.

## 2016-12-16 NOTE — Discharge Summary (Signed)
[facility] NAME: Holly Smith    MR#:  [id number]  DATE OF BIRTH:  19-Nov-1945  DATE OF ADMISSION:  12/13/2016 ADMITTING PHYSICIAN: Hillary Bow, MD  DATE OF DISCHARGE:  12/16/16 PRIMARY CARE PHYSICIAN: Juline Patch, MD    ADMISSION DIAGNOSIS:  Hypoxia [R09.02] Urinary tract infection with hematuria, site unspecified [N39.0, R31.9] Left ureteral stone [N20.1]  DISCHARGE DIAGNOSIS:  Active Problems:   Sepsis ([place])   SECONDARY DIAGNOSIS:   Past Medical History:  Diagnosis Date  . A-fib ([place])   . Abnormal Pap smear of vagina and vaginal HPV   . Arthritis    "bones ache" (01/23/2015)  . Cancer ([place])    vulvar cancer  . CHF (congestive heart failure) ([place])   . Chronic anticoagulation    She is on Xarelto for Afib  . Chronic atrial fibrillation ([place])   . Dyspnea   . Echocardiogram abnormal    Mitral regurgitation, tricuspic regurgitation  . Family history of adverse reaction to anesthesia    sister gets very nauseated and vomits  . GERD (gastroesophageal reflux disease)   . History of kidney stones   . History of stress test    Myoview 8/16: EF 42%, anteroseptal, inferoseptal, anterior, apical anterior and apical defect (likely represents breast attenuation versus scar), no ischemia; intermediate risk because of low EF  . HPV (human papilloma virus) anogenital infection    03/21/12 PAP + HR HPV  . Hypertension   . Insulin resistance   . Kidney stones   . Obesity   . OSA on CPAP   . Postoperative nausea and vomiting 1983   many years ago with hysterectomy  . Urolithiasis   . 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 female with a known history of Chronic diastolic CHF, hypertension, sleep apnea, ureteral stones presents to the emergency room complaining of 3 days of left  flank pain and decreased urination. Here she has been found to have a 5 mm left ureteral stone with hydronephrosis and UTI with sepsis. Urology was consulted and initially suggested discharging patient home on oral antibiotics and follow-up in the office. The patient has atrial fibrillation with rapid ventricular rate along with episodes of hypoxia in the emergency room. Chest x-ray clear. Patient initially received 1 L normal saline bolus and later Lasix 40 mg for concern for CHF. She does not complain of any shortness of breath. His CPAP at home. Patient is being admitted for sepsis with UTI and left ureteral stone.  * sepsis from Left ureteral stone with hydronephrosis and UTI.  Sepsis present on admission.  patient  Was given IV fluids Pain meds as needed Continued Ceftriaxone. Urine culture has revealed Proteus Mirabilis, will d/c pt with po keflex  Urology consulted ,patient had cystoscopy and stent placement  tolerated procedure well  blood cultures with no growth  * Acute hypoxic respiratory failure likely due to pain and poor inspiratory effort. Chest x-ray clear.  incentive spirometer. CT angiogram of the chest  No acute abnoralities  Outpatient pulmonology follow-withDr khan  She did receive 1 dose of IV Lasix in the emergency room but I doubt this is congestive heart failure. Has no lower extremity edema or JVD or pulmonary edema on chest x-ray. CPAP at night. Wean oxygen as tolerated.  saturating 92% o room air at rest.  Outpatient follow-up ith pulmonolog- dr.[email or web address]  * Atrial fibrillation with rapid ventricular rate due to sepsis. Presently heart rate in the 120s. We'll continue home medications at this time. Should improve with fluids and infection getting better.  Prn IV Lopressor. Will resume  Xarelto  Outpatient fllow-up with the primary cardio  * Hypokalemia. ReplaceD orally.  * DVT prophylaxis with Lovenox    DISCHARGE CONDITIONS:   FAIR  CONSULTS OBTAINED:   Treatment Team:  Hollice Espy, MD Erby Pian, MD   PROCEDURES   s/p left ureteral stent placement   DRUG ALLERGIES:   Allergies  Allergen Reactions  . Ciprofloxacin Other (See Comments)    SOB    DISCHARGE MEDICATIONS:   Current Discharge Medication List    START taking these medications   Details  acetaminophen (TYLENOL) 325 MG tablet Take 2 tablets (650 mg total) by mouth every 6 (six) hours as needed for mild pain (or Fever >/= 101).    cephALEXin (KEFLEX) 500 MG capsule Take 1 capsule (500 mg total) by mouth 3 (three) times daily. Qty: 21 capsule, Refills: 0    Influenza vac split quadrivalent PF (FLUZONE HIGH-DOSE) 0.5 ML injection Inject 0.5 mLs into the muscle tomorrow at 10 am. Qty: 0.5 mL, Refills: 0    oxyCODONE (OXY IR/ROXICODONE) 5 MG immediate release tablet Take 1 tablet (5 mg total) by mouth every 6 (six) hours as needed for moderate pain, severe pain or breakthrough pain. Qty: 20 tablet, Refills: 0      CONTINUE these medications which have NOT CHANGED   Details  albuterol (PROVENTIL HFA;VENTOLIN HFA) 108 (90 Base) MCG/ACT inhaler Inhale 2 puffs into the lungs every 8 (eight) hours as needed for wheezing or shortness of breath. Qty: 1 Inhaler, Refills: 6   Associated Diagnoses: Acute bronchitis with COPD (HCC)    dicyclomine (BENTYL) 10 MG capsule Take 10 mg by mouth 4 (four) times daily -  before meals and at bedtime.    metoprolol tartrate (LOPRESSOR) 100 MG tablet Take 150 mg by mouth 2 (two) times daily.    mirabegron ER (MYRBETRIQ) 25 MG TB24 tablet Take 1 tablet (25 mg total) by mouth daily. Qty: 30 tablet, Refills: 11    omeprazole (PRILOSEC) 20 MG capsule Take 20 mg by mouth daily.    oxybutynin (DITROPAN) 5 MG tablet Take 1 tablet (5 mg total) by mouth every 8 (eight) hours as needed for bladder spasms. Qty: 30 tablet, Refills: 0    rivaroxaban (XARELTO) 20 MG TABS tablet Take 20 mg by mouth daily with supper.    sertraline  (ZOLOFT) 25 MG tablet Take 25 mg by mouth daily.    vitamin B-12 (CYANOCOBALAMIN) 1000 MCG tablet Take 1,000 mcg by mouth every other day.       STOP taking these medications     furosemide (LASIX) 40 MG tablet      irbesartan (AVAPRO) 75 MG tablet          DISCHARGE INSTRUCTIONS:    follow-up with primary care physician, urolgy, pulmonology and cardio  1- 2 week  DIET:  Cardiac diet  DISCHARGE CONDITION:  Stable  ACTIVITY:  Activity as tolerated per PT  OXYGEN:  Home Oxygen: No.   Oxygen Delivery: room air  DISCHARGE LOCATION:  HOME    If you experience worsening of your admission symptoms, develop shortness of breath, life threatening emergency, suicidal or homicidal thoughts you must seek medical attention immediately by calling  911 or calling your MD immediately  if symptoms less severe.  You Must read complete instructions/literature along with all the possible adverse reactions/side effects for all the Medicines you take and that have been prescribed to you. Take any new Medicines after you have completely understood and accpet all the possible adverse reactions/side effects.   Please note  You were cared for by a hospitalist during your hospital stay. If you have any questions about your discharge medications or the care you received while you were in the hospital after you are discharged, you can call the unit and asked to speak with the hospitalist on call if the hospitalist that took care of you is not available. Once you are discharged, your primary care physician will handle any further medical issues. Please note that NO REFILLS for any discharge medications will be authorized once you are discharged, as it is imperative that you return to your primary care physician (or establish a relationship with a primary care physician if you do not have one) for your aftercare needs so that they can reassess your need for medications and monitor your lab  values.     Today  Chief Complaint  Patient presents with  . Abdominal Pain  . Weakness     patient is  feeling much bette.denies any shortness of breath. Not on o2 while resting. Feels better, wants to go home   ROS:  CONSTITUTIONAL: Denies fevers, chills. Denies any fatigue, weakness.  EYES: Denies blurry vision, double vision, eye pain. EARS, NOSE, THROAT: Denies tinnitus, ear pain, hearing loss. RESPIRATORY: Denies cough, wheeze, shortness of breath.  CARDIOVASCULAR: Denies chest pain, palpitations, edema.  GASTROINTESTINAL: Denies nausea, vomiting, diarrhea, abdominal pain. Denies bright red blood per rectum. GENITOURINARY: Denies dysuria, hematuria. ENDOCRINE: Denies nocturia or thyroid problems. HEMATOLOGIC AND LYMPHATIC: Denies easy bruising or bleeding. SKIN: Denies rash or lesion. MUSCULOSKELETAL: Denies pain in neck, back, shoulder, knees, hips or arthritic symptoms.  NEUROLOGIC: Denies paralysis, paresthesias.  PSYCHIATRIC: Denies anxiety or depressive symptoms.   VITAL SIGNS:  Blood pressure 106/67, pulse 92, temperature 98 F (36.7 C), resp. rate 19, height 5\' 1"  (1.549 m), weight 99.2 kg (218 lb 11.2 oz), SpO2 94 %.  I/O:    Intake/Output Summary (Last 24 hours) at 12/16/16 1359 Last data filed at 12/16/16 1026  Gross per 24 hour  Intake              420 ml  Output              700 ml  Net             -280 ml    PHYSICAL EXAMINATION:  GENERAL:  71 y.o.-year-old patient lying in the bed with no acute distress.  EYES: Pupils equal, round, reactive to light and accommodation. No scleral icterus. Extraocular muscles intact.  HEENT: Head atraumatic, normocephalic. Oropharynx and nasopharynx clear.  NECK:  Supple, no jugular venous distention. No thyroid enlargement, no tenderness.  LUNGS: Normal breath sounds bilaterally, no wheezing, rales,rhonchi or crepitation. No use of accessory muscles of respiration.  CARDIOVASCULAR: S1, S2 normal. No murmurs,  rubs, or gallops.  ABDOMEN: Soft, non-tender, non-distended. Bowel sounds present. No organomegaly or mass.  EXTREMITIES: No pedal edema, cyanosis, or clubbing.  NEUROLOGIC: Cranial nerves II through XII are intact. Muscle strength 5/5 in all extremities. Sensation intact. Gait not checked.  PSYCHIATRIC: The patient is alert and oriented x 3.  SKIN: No obvious rash, lesion, or ulcer.   DATA REVIEW:  CBC  Recent Labs Lab 12/16/16 0602  WBC 8.7  HGB 11.4*  HCT 33.1*  PLT 173    Chemistries   Recent Labs Lab 12/13/16 1324  12/16/16 0602  NA 135  < > 138  K 3.3*  < > 3.6  CL 94*  < > 102  CO2 29  < > 30  GLUCOSE 153*  < > 135*  BUN 16  < > 11  CREATININE 0.95  < > 0.89  CALCIUM 9.2  < > 8.8*  AST 17  --   --   ALT 11*  --   --   ALKPHOS 67  --   --   BILITOT 0.9  --   --   < > = values in this interval not displayed.  Cardiac Enzymes No results for input(s): TROPONINI in the last 168 hours.  Microbiology Results  Results for orders placed or performed during the hospital encounter of 12/13/16  Urine Culture     Status: Abnormal   Collection Time: 12/13/16  1:24 PM  Result Value Ref Range Status   Specimen Description URINE, RANDOM  Final   Special Requests NONE  Final   Culture >=100,000 COLONIES/mL PROTEUS MIRABILIS (A)  Final   Report Status 12/16/2016 FINAL  Final   Organism ID, Bacteria PROTEUS MIRABILIS (A)  Final      Susceptibility   Proteus mirabilis - MIC*    AMPICILLIN <=2 SENSITIVE Sensitive     CEFAZOLIN <=4 SENSITIVE Sensitive     CEFTRIAXONE <=1 SENSITIVE Sensitive     CIPROFLOXACIN <=0.25 SENSITIVE Sensitive     GENTAMICIN <=1 SENSITIVE Sensitive     IMIPENEM 2 SENSITIVE Sensitive     NITROFURANTOIN 128 RESISTANT Resistant     TRIMETH/SULFA <=20 SENSITIVE Sensitive     AMPICILLIN/SULBACTAM <=2 SENSITIVE Sensitive     PIP/TAZO <=4 SENSITIVE Sensitive     * >=100,000 COLONIES/mL PROTEUS MIRABILIS  CULTURE, BLOOD (ROUTINE X 2) w Reflex to ID  Panel     Status: None (Preliminary result)   Collection Time: 12/13/16 10:29 PM  Result Value Ref Range Status   Specimen Description BLOOD RIGHT HAND  Final   Special Requests   Final    BOTTLES DRAWN AEROBIC AND ANAEROBIC Blood Culture results may not be optimal due to an excessive volume of blood received in culture bottles   Culture NO GROWTH 3 DAYS  Final   Report Status PENDING  Incomplete  CULTURE, BLOOD (ROUTINE X 2) w Reflex to ID Panel     Status: None (Preliminary result)   Collection Time: 12/13/16 10:44 PM  Result Value Ref Range Status   Specimen Description BLOOD LAC  Final   Special Requests   Final    BOTTLES DRAWN AEROBIC AND ANAEROBIC Blood Culture adequate volume   Culture NO GROWTH 3 DAYS  Final   Report Status PENDING  Incomplete    RADIOLOGY:  Dg Chest 2 View  Result Date: 12/13/2016 CLINICAL DATA:  Shortness of breath EXAM: CHEST  2 VIEW COMPARISON:  01/21/2015 FINDINGS: Low lung volumes. The cardio pericardial silhouette is enlarged. The visualized bony structures of the thorax are intact. IMPRESSION: Low volume film without acute cardiopulmonary findings. Electronically Signed   By: Misty Stanley M.D.   On: 12/13/2016 19:49   Ct Angio Chest Pe W Or Wo Contrast  Result Date: 12/15/2016 CLINICAL DATA:  Pt has positive D-dimer and shortness of breath xtoday. PE suspected. EXAM: CT ANGIOGRAPHY CHEST WITH CONTRAST TECHNIQUE: Multidetector  CT imaging of the chest was performed using the standard protocol during bolus administration of intravenous contrast. Multiplanar CT image reconstructions and MIPs were obtained to evaluate the vascular anatomy. CONTRAST:  75 mL of Isovue 370 intravenous contrast COMPARISON:  Chest radiograph, 12/13/2016.  Chest CT, 06/20/2013. FINDINGS: Cardiovascular: There is satisfactory opacification of the pulmonary arteries to the segmental level. There is no evidence of a pulmonary embolism. Heart is mildly enlarged. There are mild  three-vessel coronary artery calcifications. The aorta is normal in caliber. No evidence of dissection. Mild atherosclerotic calcifications are noted along the thoracic aorta. Mediastinum/Nodes: No neck base or axillary masses or enlarged lymph nodes. No mediastinal or hilar masses or adenopathy. Trachea is widely patent. Small sliding hiatal hernia. Esophagus otherwise unremarkable. Lungs/Pleura: Minimal dependent subsegmental atelectasis. No evidence of pneumonia or pulmonary edema. No mass or suspicious nodule. No pleural effusion or pneumothorax. Upper Abdomen: No acute finding.  Hepatic steatosis. Musculoskeletal: No fracture or acute finding. No osteoblastic or osteolytic lesions. Review of the MIP images confirms the above findings. IMPRESSION: 1. No evidence of pulmonary embolism.  No acute findings. 2. Mild cardiomegaly coronary artery calcifications. 3. Aortic atherosclerosis. Aortic Atherosclerosis (ICD10-I70.0). Electronically Signed   By: Lajean Manes M.D.   On: 12/15/2016 13:47   Ct Abdomen Pelvis W Contrast  Result Date: 12/13/2016 CLINICAL DATA:  71 year old female with left abdominal and pelvic pain for 5 days with vomiting. History of vulvar cancer. EXAM: CT ABDOMEN AND PELVIS WITH CONTRAST TECHNIQUE: Multidetector CT imaging of the abdomen and pelvis was performed using the standard protocol following bolus administration of intravenous contrast. CONTRAST:  124mL ISOVUE-300 IOPAMIDOL (ISOVUE-300) INJECTION 61% COMPARISON:  01/28/2015 CT FINDINGS: Lower chest: No acute abnormality Hepatobiliary: Mild hepatic steatosis noted. Gallbladder is unremarkable. No biliary dilatation. Pancreas: Unremarkable Spleen: Unremarkable Adrenals/Urinary Tract: A 5 mm proximal left ureteral calculus causes moderate left hydronephrosis and perinephric inflammation. The right kidney, adrenal glands and bladder are unremarkable. Stomach/Bowel: No bowel obstruction, bowel wall thickening or inflammatory changes.  Colonic diverticulosis noted without diverticulitis. A small to moderate hiatal hernia is present. The appendix is normal. Vascular/Lymphatic: Aortic atherosclerosis. No enlarged abdominal or pelvic lymph nodes. Reproductive: Status post hysterectomy. No adnexal masses. Other: No ascites, focal collection or pneumoperitoneum. Musculoskeletal: No acute or significant osseous findings. IMPRESSION: 1. Obstructing 5 mm proximal left ureteral calculus causing moderate left hydronephrosis. 2. Small-moderate hiatal hernia 3. Mild hepatic steatosis 4.  Aortic Atherosclerosis (ICD10-I70.0). Electronically Signed   By: Margarette Canada M.D.   On: 12/13/2016 17:11    EKG:   Orders placed or performed during the hospital encounter of 12/13/16  . ED EKG  . ED EKG      Management plans discussed with the patient, family and they are in agreement.  CODE STATUS:     Code Status Orders        Start     Ordered   12/13/16 2101  Full code  Continuous     12/13/16 2102    Code Status History    Date Active Date Inactive Code Status Order ID Comments User Context   01/28/2015  9:36 PM 01/31/2015  5:05 PM Full Code [id number]  Reyne Dumas, MD ED   01/21/2015  6:51 PM 01/24/2015  6:07 PM Full Code [id number]  Leanor Kail, PA Inpatient      TOTAL TIME TAKING CARE OF THIS PATIENT: 45  minutes.   Note: This dictation was prepared with Dragon dictation along with smaller phrase technology. Any transcriptional  errors that result from this process are unintentional.   @[facility] @  on 12/16/2016 at 1:59 PM  Between 7am to 6pm - Pager - [phone]  After 6pm go to [email or web address] - password [email or web address] [facility]  Office  [phone]  CC: Primary care physician; Juline Patch, MD

## 2016-12-16 NOTE — Evaluation (Signed)
Physical Therapy Evaluation Patient Details Name: Holly Smith MRN: [id number] DOB: Oct 14, 1945 Today's Date: 12/16/2016   History of Present Illness  Holly Smith is a 71 y.o. female with a known history of chronic diastolic CHF, hypertension, sleep apnea, ureteral stones presents to the emergency room complaining of 3 days of left flank pain and decreased urination. Here she has been found to have a 5 mm left ureteral stone with hydronephrosis and UTI with sepsis. Urology was consulted and initially suggested discharging patient home on oral antibiotics and follow-up in the office. The patient has atrial fibrillation with rapid ventricular rate along with episodes of hypoxia in the emergency room. Chest x-ray clear. Patient initially received 1 L normal saline bolus and later Lasix 40 mg for concern for CHF. She does not complain of any shortness of breath. Patient is being admitted for sepsis with UTI and left ureteral stone. She is now POD # 2 s/p left ureteral stent placement.   Clinical Impression  Pt is independent with transfers and ambulation. Pt demonstrates good speed and stability with ambulation around RN station. SaO2 91% on room air at rest. Improves to 93-95% on room air with exertion. Pt denies denies DOE with ambulation. HR around 100 bpm with ambulation. She has some higher level balance deficits in tandem and single leg stance but not related to current admission. However no need for assistive device. She could benefit from OP PT to work on her balance but can obtain an order from her PCP if she is interested. No further acute care needs identified. Will complete order and sign off. Please enter new order if status or needs change.     Follow Up Recommendations Outpatient PT;Other (comment) (For balance, can get order from PCP)    Equipment Recommendations  None recommended by PT    Recommendations for Other Services       Precautions / Restrictions  Precautions Precautions: Fall Restrictions Weight Bearing Restrictions: No      Mobility  Bed Mobility               General bed mobility comments: Pt received upright at EOB and left upright at EOB. Bed mobility not assessed  Transfers Overall transfer level: Independent Equipment used: None             General transfer comment: Pt demonstrates good speed, sequencing, and stability with transfers  Ambulation/Gait Ambulation/Gait assistance: Independent Ambulation Distance (Feet): 200 Feet Assistive device: None Gait Pattern/deviations: WFL(Within Functional Limits) Gait velocity: WFL Gait velocity interpretation: at or above normal speed for age/gender General Gait Details: Pt demonstrates good speed and stability with ambulation around RN station. SaO2 91% on room air at rest. Improves to 93-95% on room air with exertion. Pt denies denies DOE with ambulation. HR around 100 bpm with ambulation.   Stairs            Wheelchair Mobility    Modified Rankin (Stroke Patients Only)       Balance Overall balance assessment: Needs assistance Sitting-balance support: No upper extremity supported Sitting balance-Leahy Scale: Good     Standing balance support: No upper extremity supported Standing balance-Leahy Scale: Good Standing balance comment: Negative [place]berg. Tandem stance 2-3 seconds, single leg balance 3 seconds                             Pertinent Vitals/Pain Pain Assessment: No/denies pain    Home Living Family/patient expects to be  discharged to:: Private residence Living Arrangements: Alone Available Help at Discharge: Family Type of Home: House Home Access: Stairs to enter Entrance Stairs-Rails: Left Entrance Stairs-Number of Steps: 2 Home Layout: One level Home Equipment: None Additional Comments: No walker, cane, wc, hospital bed, oxygen    Prior Function Level of Independence: Independent         Comments:  Independent community ambulator without assistive device. No falls. Independent with ADLs/IADLs. Drives     Hand Dominance   Dominant Hand: Right    Extremity/Trunk Assessment   Upper Extremity Assessment Upper Extremity Assessment: Overall WFL for tasks assessed    Lower Extremity Assessment Lower Extremity Assessment: Overall WFL for tasks assessed       Communication   Communication: No difficulties  Cognition Arousal/Alertness: Awake/alert Behavior During Therapy: WFL for tasks assessed/performed Overall Cognitive Status: Within Functional Limits for tasks assessed                                        General Comments      Exercises     Assessment/Plan    PT Assessment Patent does not need any further PT services  PT Problem List Decreased balance       PT Treatment Interventions      PT Goals (Current goals can be found in the Care Plan section)  Acute Rehab PT Goals PT Goal Formulation: All assessment and education complete, DC therapy    Frequency     Barriers to discharge        Co-evaluation               AM-PAC PT "6 Clicks" Daily Activity  Outcome Measure Difficulty turning over in bed (including adjusting bedclothes, sheets and blankets)?: None Difficulty moving from lying on back to sitting on the side of the bed? : None Difficulty sitting down on and standing up from a chair with arms (e.g., wheelchair, bedside commode, etc,.)?: None Help needed moving to and from a bed to chair (including a wheelchair)?: None Help needed walking in hospital room?: None Help needed climbing 3-5 steps with a railing? : None 6 Click Score: 24    End of Session Equipment Utilized During Treatment: Gait belt Activity Tolerance: Patient tolerated treatment well Patient left: in bed;with call bell/phone within reach;with nursing/sitter in room;Other (comment) (sitting on EOB) Nurse Communication: Mobility status;Other (comment) (SaO2  readings) PT Visit Diagnosis: Difficulty in walking, not elsewhere classified (R26.2)    Time: 1100-1114 PT Time Calculation (min) (ACUTE ONLY): 14 min   Charges:   PT Evaluation $PT Eval Low Complexity: 1 Low     PT G Codes:   PT G-Codes **NOT FOR INPATIENT CLASS** Functional Assessment Tool Used: AM-PAC 6 Clicks Basic Mobility Functional Limitation: Mobility: Walking and moving around Mobility: Walking and Moving Around Current Status (F0932): 0 percent impaired, limited or restricted Mobility: Walking and Moving Around Goal Status (T5573): 0 percent impaired, limited or restricted Mobility: Walking and Moving Around Discharge Status (U2025): 0 percent impaired, limited or restricted    Phillips Grout PT, DPT    Huprich,Jason 12/16/2016, 12:17 PM

## 2016-12-17 ENCOUNTER — Telehealth: Payer: Self-pay

## 2016-12-17 NOTE — Telephone Encounter (Signed)
I have made the 1st attempt to contact the patient or family member in charge, in order to follow up from recently being discharged from the hospital. I left a message on voicemail but I will make another attempt at a different time.  

## 2016-12-18 LAB — CULTURE, BLOOD (ROUTINE X 2)
CULTURE: NO GROWTH
CULTURE: NO GROWTH
SPECIAL REQUESTS: ADEQUATE

## 2016-12-20 NOTE — Progress Notes (Signed)
12/22/2016 12:49 PM   Holly Smith Dec 29, 1945 [id number]  Referring provider: Juline Patch, MD [address] [place], [place] [address]  Chief Complaint  Patient presents with  . Follow-up    2 week follow up by stone management    HPI: Patient is a 71 year old Caucasian female who was recently hospitalized for an obstructing 5 mm ureteral stone associated with heart arrhythmias and leukocytosis concerning for sepsis.  Background history Holly Smith is a 71 y.o. year old with a 5 mm obstructing left proximal ureteral stone with moderate hydroureteronephrosis admitted to the hospitalist service last night in the setting of a positive urinalysis, leukocytosis, episode of A. Fib with RVR and hypoxia concerning for sepsis. This morning, she spent a low-grade temp to 100.5.   She reports that she is been experiencing left lower quadrant pain with associated nausea and vomiting for at least 4 days prior to the admission. She also has generalized malaise and fatigue.  She did have gross hematuria following her bladder biopsy which subsequently resolved. She denies any dysuria.  She is status post left ureteral stent placement with Dr. Erlene Quan on 12/14/2016.    Today, she is having gross hematuria, urgency, frequency and diarrhea.  She has not had fevers, chills, nausea or vomiting.  She is not experiencing any flank pain.  She is not passed any stone fragments.  Her UA today is 8 trach positive and greater than 30 RBCs per high-power field.  She will finish her Keflex today.    She states the diarrhea began during hospitalization.  Upon further questioning she does have IBS and can have episodes of diarrhea, she does states it is worse since being in the hospital.  She also had questions as to why her Lasix and Avapro were discontinued upon discharge from the hospital.        PMH: Past Medical History:  Diagnosis Date  . A-fib ([place])   . Abnormal Pap smear  of vagina and vaginal HPV   . Arthritis    "bones ache" (01/23/2015)  . Cancer ([place])    vulvar cancer  . CHF (congestive heart failure) ([place])   . Chronic anticoagulation    She is on Xarelto for Afib  . Chronic atrial fibrillation ([place])   . Dyspnea   . Echocardiogram abnormal    Mitral regurgitation, tricuspic regurgitation  . Family history of adverse reaction to anesthesia    sister gets very nauseated and vomits  . GERD (gastroesophageal reflux disease)   . History of kidney stones   . History of stress test    Myoview 8/16: EF 42%, anteroseptal, inferoseptal, anterior, apical anterior and apical defect (likely represents breast attenuation versus scar), no ischemia; intermediate risk because of low EF  . HPV (human papilloma virus) anogenital infection    03/21/12 PAP + HR HPV  . Hypertension   . Insulin resistance   . Kidney stones   . Obesity   . OSA on CPAP   . Postoperative nausea and vomiting 1983   many years ago with hysterectomy  . Urolithiasis   . Vulvar cancer ([place]) 2016   invasive verrucous carcinoma of the vulva. History of genital warts and vulvar condyloma    Surgical History: Past Surgical History:  Procedure Laterality Date  . ABDOMINAL HYSTERECTOMY    . APPENDECTOMY    . BILATERAL SALPINGOOPHORECTOMY  2003   benign ovarian cancer  . CARDIOVERSION  2014-2015   "[place]"  .  COLONOSCOPY WITH PROPOFOL N/A 06/10/2015   Procedure: COLONOSCOPY WITH PROPOFOL;  Surgeon: Lucilla Lame, MD;  Location: [facility] END[facility]COPY;  Service: Endoscopy;  Laterality: N/A;  . CYSTOSCOPY W/ RETROGRADES Bilateral 10/19/2016   Procedure: CYSTOSCOPY WITH RETROGRADE PYELOGRAM;  Surgeon: Hollice Espy, MD;  Location: [facility] ORS;  Service: Urology;  Laterality: Bilateral;  . CYSTOSCOPY WITH BIOPSY N/A 10/19/2016   Procedure: CYSTOSCOPY WITH BLADDER BIOPSY, VAGINAL WALL BIOPSY;  Surgeon: Hollice Espy, MD;  Location: [facility] ORS;  Service: Urology;  Laterality: N/A;  . CYSTOSCOPY  WITH STENT PLACEMENT Left 12/14/2016   Procedure: CYSTOSCOPY WITH STENT PLACEMENT;  Surgeon: Hollice Espy, MD;  Location: [facility] ORS;  Service: Urology;  Laterality: Left;  . ESOPHAGOGASTRODUODENOSCOPY (EGD) WITH PROPOFOL N/A 10/05/2016   Surgeon: Lucilla Lame, MD;  Location: [facility] ENDOSCOPY;  Results: Barrett's Esophagus- repeat in 3 years 09/2019  . LITHOTRIPSY    . TONSILLECTOMY    . VULVECTOMY Right 05/10/2014   Excisional biopsy of the superior right labial majora mass; [place] PARTIAL  07/02/2014   Re-excision and sentinel node dissection at [facility].     Home Medications:  Allergies as of 12/22/2016      Reactions   Ciprofloxacin Other (See Comments)   SOB      Medication List       Accurate as of 12/22/16 12:49 PM. Always use your most recent med list.          acetaminophen 325 MG tablet Commonly known as:  TYLENOL Take 2 tablets (650 mg total) by mouth every 6 (six) hours as needed for mild pain (or Fever >/= 101).   albuterol 108 (90 Base) MCG/ACT inhaler Commonly known as:  PROVENTIL HFA;VENTOLIN HFA Inhale 2 puffs into the lungs every 8 (eight) hours as needed for wheezing or shortness of breath.   cephALEXin 500 MG capsule Commonly known as:  KEFLEX Take 1 capsule (500 mg total) by mouth 3 (three) times daily.   dicyclomine 10 MG capsule Commonly known as:  BENTYL Take 10 mg by mouth 4 (four) times daily -  before meals and at bedtime.   metoprolol tartrate 100 MG tablet Commonly known as:  LOPRESSOR Take 150 mg by mouth 2 (two) times daily.   mirabegron ER 25 MG Tb24 tablet Commonly known as:  MYRBETRIQ Take 1 tablet (25 mg total) by mouth daily.   omeprazole 20 MG capsule Commonly known as:  PRILOSEC Take 20 mg by mouth daily.   oxybutynin 5 MG tablet Commonly known as:  DITROPAN Take 1 tablet (5 mg total) by mouth every 8 (eight) hours as needed for bladder spasms.   oxyCODONE 5 MG immediate release tablet Commonly known as:   Oxy IR/ROXICODONE Take 1 tablet (5 mg total) by mouth every 6 (six) hours as needed for moderate pain, severe pain or breakthrough pain.   rivaroxaban 20 MG Tabs tablet Commonly known as:  XARELTO Take 20 mg by mouth daily with supper.   sertraline 25 MG tablet Commonly known as:  ZOLOFT Take 25 mg by mouth daily.   vitamin B-12 1000 MCG tablet Commonly known as:  CYANOCOBALAMIN Take 1,000 mcg by mouth every other day.       Allergies:  Allergies  Allergen Reactions  . Ciprofloxacin Other (See Comments)    SOB    Family History: Family History  Problem Relation Age of Onset  . Prostate cancer Brother 32       still living and well  . Heart attack Father   .  Stroke Mother   . Diabetes Mother   . Hypertension Mother   . Heart attack Brother   . Pancreatic cancer Brother   . Healthy Sister   . Breast cancer Neg Hx   . Kidney cancer Neg Hx   . Bladder Cancer Neg Hx     Social History:  reports that she quit smoking about 4 years ago. Her smoking use included Cigarettes and E-cigarettes. She has a 50.00 pack-year smoking history. She has never used smokeless tobacco. She reports that she does not drink alcohol or use drugs.  ROS: UROLOGY Frequent Urination?: No Hard to postpone urination?: No Burning/pain with urination?: No Get up at night to urinate?: No Leakage of urine?: No Urine stream starts and stops?: No Trouble starting stream?: No Do you have to strain to urinate?: No Blood in urine?: Yes Urinary tract infection?: No Sexually transmitted disease?: No Injury to kidneys or bladder?: No Painful intercourse?: No Weak stream?: No Currently pregnant?: No Vaginal bleeding?: No Last menstrual period?: n  Gastrointestinal Nausea?: No Vomiting?: No Indigestion/heartburn?: No Diarrhea?: No Constipation?: No  Constitutional Fever: No Night sweats?: No Weight loss?: No Fatigue?: No  Skin Skin rash/lesions?: No Itching?: No  Eyes Blurred  vision?: No Double vision?: No  Ears/Nose/Throat Sore throat?: No Sinus problems?: No  Hematologic/Lymphatic Swollen glands?: No Easy bruising?: No  Cardiovascular Leg swelling?: No Chest pain?: No  Respiratory Cough?: No Shortness of breath?: No  Endocrine Excessive thirst?: No  Musculoskeletal Back pain?: No Joint pain?: No  Neurological Headaches?: No Dizziness?: No  Psychologic Depression?: No Anxiety?: No  Physical Exam: BP (!) 159/101   Pulse 89   Ht 5\' 1"  (1.549 m)   Wt 213 lb (96.6 kg)   BMI 40.25 kg/m   Constitutional: Well nourished. Alert and oriented, No acute distress. HEENT: [place] AT, moist mucus membranes. Trachea midline, no masses. Cardiovascular: No clubbing, cyanosis, or edema. Respiratory: Normal respiratory effort, no increased work of breathing. GI: Abdomen is soft, non tender, non distended, no abdominal masses. Liver and spleen not palpable.  No hernias appreciated.  Stool sample for occult testing is not indicated.   GU: No CVA tenderness.  No bladder fullness or masses.   Skin: No rashes, bruises or suspicious lesions. Lymph: No cervical or inguinal adenopathy. Neurologic: Grossly intact, no focal deficits, moving all 4 extremities. Psychiatric: Normal mood and affect.  Laboratory Data: Lab Results  Component Value Date   WBC 8.7 12/16/2016   HGB 11.4 (L) 12/16/2016   HCT 33.1 (L) 12/16/2016   MCV 91.0 12/16/2016   PLT 173 12/16/2016    Lab Results  Component Value Date   CREATININE 0.89 12/16/2016     Lab Results  Component Value Date   HGBA1C 6.6 (H) 12/15/2016    Lab Results  Component Value Date   TSH 2.050 09/07/2016       Component Value Date/Time   CHOL 165 03/18/2016 1056   HDL 42 03/18/2016 1056   CHOLHDL 3.9 03/18/2016 1056   CHOLHDL 4.4 01/22/2015 0455   VLDL 21 01/22/2015 0455   LDLCALC 89 03/18/2016 1056    Lab Results  Component Value Date   AST 17 12/13/2016   Lab Results  Component  Value Date   ALT 11 (L) 12/13/2016    Urinalysis > 30 RBC's.   Nitrite positive.  See EPIC.  I have reviewed the labs.   Pertinent Imaging: CLINICAL DATA:  71 year old female with left abdominal and pelvic pain for 5 days  with vomiting. History of vulvar cancer.  EXAM: CT ABDOMEN AND PELVIS WITH CONTRAST  TECHNIQUE: Multidetector CT imaging of the abdomen and pelvis was performed using the standard protocol following bolus administration of intravenous contrast.  CONTRAST:  129mL ISOVUE-300 IOPAMIDOL (ISOVUE-300) INJECTION 61%  COMPARISON:  01/28/2015 CT  FINDINGS: Lower chest: No acute abnormality  Hepatobiliary: Mild hepatic steatosis noted. Gallbladder is unremarkable. No biliary dilatation.  Pancreas: Unremarkable  Spleen: Unremarkable  Adrenals/Urinary Tract: A 5 mm proximal left ureteral calculus causes moderate left hydronephrosis and perinephric inflammation. The right kidney, adrenal glands and bladder are unremarkable.  Stomach/Bowel: No bowel obstruction, bowel wall thickening or inflammatory changes. Colonic diverticulosis noted without diverticulitis. A small to moderate hiatal hernia is present. The appendix is normal.  Vascular/Lymphatic: Aortic atherosclerosis. No enlarged abdominal or pelvic lymph nodes.  Reproductive: Status post hysterectomy. No adnexal masses.  Other: No ascites, focal collection or pneumoperitoneum.  Musculoskeletal: No acute or significant osseous findings.  IMPRESSION: 1. Obstructing 5 mm proximal left ureteral calculus causing moderate left hydronephrosis. 2. Small-moderate hiatal hernia 3. Mild hepatic steatosis 4.  Aortic Atherosclerosis (ICD10-I70.0).   Electronically Signed   By: Margarette Canada M.D.   On: 12/13/2016 17:11 I have independently reviewed the films.    Assessment & Plan:    1. Left ureteral stone   - s/p left ureteral stent placement  - schedule left ureteroscopy with laser  lithotripsy and ureteral stent exchange - will need cardiac clearance prior to procedure to stop Xarelto  - explained to the patient how the procedure is performed and the risks involved  - informed patient that they will have a stent placed during the procedure and will remain in place after the procedure for a short time.   - stent may be removed in the office with a cystoscope or patient may be instructed to remove the stent themselves by the string  - described "stent pain" as feelings of needing to urinate/overactive bladder and a warm, tingling sensation to intense pain in the affected flank  - residual stones within the kidney or ureter may be present after the procedure and may need to have these addressed at a different encounter  - injury to the ureter is the most common intra-operative risk, it may result in an open procedure to correct the defect  - infection and bleeding are also risks  - explained the risks of general anesthesia, such as: MI, CVA, paralysis, coma and/or death.  - advised to contact our office or seek treatment in the ED if becomes febrile or pain/ vomiting are difficult control in order to arrange for emergent/urgent intervention   2. Left hydronephrosis  - obtain RUS to ensure the hydronephrosis has resolved once she has recovered from URS  3. Microscopic hematuria  - UA today demonstrates > 30 RBC's and nitrite positive - we'll send for culture - we will hold on prescribing an antibiotic due to her increase of diarrhea until culture result is available ? Possible C.diff  - continue to monitor the patient's UA after the treatment/passage of the stone to ensure the hematuria has resolved  - if hematuria persists, we will pursue a hematuria workup with CT Urogram and cystoscopy if appropriate.  4. Diarrhea  - Patient will contact her GI physician regarding further management of her diarrhea as she has IBS   5. CHF  - patient will contact her cardiologist  regarding her questions concerning the Lasix and Avapro   Return for left URS/LL/ureteral stent  exchange.  These notes generated with voice recognition software. I apologize for typographical errors.  Zara Council, [facility] [address], [place], [place] [address] [phone]

## 2016-12-22 ENCOUNTER — Ambulatory Visit (INDEPENDENT_AMBULATORY_CARE_PROVIDER_SITE_OTHER): Payer: Medicare Other | Admitting: Urology

## 2016-12-22 ENCOUNTER — Encounter: Payer: Self-pay | Admitting: Urology

## 2016-12-22 VITALS — BP 159/101 | HR 89 | Ht 61.0 in | Wt 213.0 lb

## 2016-12-22 DIAGNOSIS — N201 Calculus of ureter: Secondary | ICD-10-CM

## 2016-12-22 DIAGNOSIS — N132 Hydronephrosis with renal and ureteral calculous obstruction: Secondary | ICD-10-CM

## 2016-12-22 DIAGNOSIS — R3129 Other microscopic hematuria: Secondary | ICD-10-CM

## 2016-12-22 LAB — URINALYSIS, COMPLETE
BILIRUBIN UA: NEGATIVE
NITRITE UA: POSITIVE — AB
SPEC GRAV UA: 1.015 (ref 1.005–1.030)
UUROB: 2 mg/dL — AB (ref 0.2–1.0)
pH, UA: 5.5 (ref 5.0–7.5)

## 2016-12-22 LAB — MICROSCOPIC EXAMINATION: WBC, UA: NONE SEEN /hpf (ref 0–?)

## 2016-12-22 NOTE — Progress Notes (Signed)
Gave patient strainer and stone kit.

## 2016-12-23 ENCOUNTER — Telehealth: Payer: Self-pay | Admitting: Interventional Cardiology

## 2016-12-23 NOTE — Telephone Encounter (Signed)
New message      [facility].  She has a kidney inf and a kidney stone.  They are treating the infection then will crush the stone.  Pt states that the doctor in the hosp want her to stop her fluid pill and bp pill.  Patient does not want to stop any medications unless Dr Irish Lack says it is ok.  She did not know the name of the fluid or bp pill

## 2016-12-23 NOTE — Telephone Encounter (Signed)
OK to continue Lasix and irbesartan.  Kidney function was stable on last lab check.

## 2016-12-23 NOTE — Telephone Encounter (Signed)
Patient calling and states that she was recently in the hospital at [facility] for kidney infection and kidney stone. She states that when they discharged her on 9/27 that they told her to stop her lasix and irbesartan and she does not know why. Patient states that before she went to the hospital she was taking lasix 80 mg QD and irbesartan 37.5 mg QD. She states that she did not want to stop her lasix or irbesartan without consulting with her heart doctor first. She states that she did cut her lasix back to 40 mg QD and has continued to take her irbesartan 37.5 mg QD. Patient also is taking metoprolol tartrate 150 mg BID. Patient states she does not have a way to check her BP at home, but when she was seen by urology yesterday her BP was 159/101 and HR 89. Urology advised her to contact cardiology regarding her lasix and irbesartan. Patient wanting Dr. Irish Lack to advise on lasix and irbesartan.

## 2016-12-24 ENCOUNTER — Other Ambulatory Visit: Payer: Self-pay | Admitting: Radiology

## 2016-12-24 DIAGNOSIS — N201 Calculus of ureter: Secondary | ICD-10-CM

## 2016-12-24 NOTE — Telephone Encounter (Signed)
Returned call to pt to let her know that Dr. Irish Lack stated that it was ok for her to continue her Irbesartan and Lasix, that her labs were stable on the last check.  Pt thanked me for the call.

## 2016-12-27 ENCOUNTER — Encounter
Admission: RE | Admit: 2016-12-27 | Discharge: 2016-12-27 | Disposition: A | Discharge status: Home / Self Care | Payer: Medicare Other | Source: Ambulatory Visit | Attending: Urology | Admitting: Urology

## 2016-12-27 DIAGNOSIS — I081 Rheumatic disorders of both mitral and tricuspid valves: Secondary | ICD-10-CM | POA: Diagnosis not present

## 2016-12-27 DIAGNOSIS — Z87442 Personal history of urinary calculi: Secondary | ICD-10-CM | POA: Diagnosis not present

## 2016-12-27 DIAGNOSIS — Z87891 Personal history of nicotine dependence: Secondary | ICD-10-CM | POA: Diagnosis not present

## 2016-12-27 DIAGNOSIS — Z6839 Body mass index (BMI) 39.0-39.9, adult: Secondary | ICD-10-CM | POA: Diagnosis not present

## 2016-12-27 DIAGNOSIS — Z7901 Long term (current) use of anticoagulants: Secondary | ICD-10-CM | POA: Diagnosis not present

## 2016-12-27 DIAGNOSIS — K760 Fatty (change of) liver, not elsewhere classified: Secondary | ICD-10-CM | POA: Diagnosis not present

## 2016-12-27 DIAGNOSIS — E8881 Metabolic syndrome: Secondary | ICD-10-CM | POA: Diagnosis not present

## 2016-12-27 DIAGNOSIS — G4733 Obstructive sleep apnea (adult) (pediatric): Secondary | ICD-10-CM | POA: Diagnosis not present

## 2016-12-27 DIAGNOSIS — K580 Irritable bowel syndrome with diarrhea: Secondary | ICD-10-CM | POA: Diagnosis not present

## 2016-12-27 DIAGNOSIS — N132 Hydronephrosis with renal and ureteral calculous obstruction: Secondary | ICD-10-CM | POA: Diagnosis not present

## 2016-12-27 DIAGNOSIS — R0902 Hypoxemia: Secondary | ICD-10-CM | POA: Diagnosis not present

## 2016-12-27 DIAGNOSIS — K449 Diaphragmatic hernia without obstruction or gangrene: Secondary | ICD-10-CM | POA: Diagnosis not present

## 2016-12-27 DIAGNOSIS — D72829 Elevated white blood cell count, unspecified: Secondary | ICD-10-CM | POA: Diagnosis not present

## 2016-12-27 DIAGNOSIS — I482 Chronic atrial fibrillation: Secondary | ICD-10-CM | POA: Diagnosis not present

## 2016-12-27 DIAGNOSIS — M1990 Unspecified osteoarthritis, unspecified site: Secondary | ICD-10-CM | POA: Diagnosis not present

## 2016-12-27 DIAGNOSIS — Z8543 Personal history of malignant neoplasm of ovary: Secondary | ICD-10-CM | POA: Diagnosis not present

## 2016-12-27 DIAGNOSIS — I110 Hypertensive heart disease with heart failure: Secondary | ICD-10-CM | POA: Diagnosis not present

## 2016-12-27 DIAGNOSIS — K219 Gastro-esophageal reflux disease without esophagitis: Secondary | ICD-10-CM | POA: Diagnosis not present

## 2016-12-27 DIAGNOSIS — R0600 Dyspnea, unspecified: Secondary | ICD-10-CM | POA: Diagnosis not present

## 2016-12-27 DIAGNOSIS — E669 Obesity, unspecified: Secondary | ICD-10-CM | POA: Diagnosis not present

## 2016-12-27 DIAGNOSIS — R310 Gross hematuria: Secondary | ICD-10-CM | POA: Diagnosis not present

## 2016-12-27 DIAGNOSIS — Z8589 Personal history of malignant neoplasm of other organs and systems: Secondary | ICD-10-CM | POA: Diagnosis not present

## 2016-12-27 DIAGNOSIS — I700 Atherosclerosis of aorta: Secondary | ICD-10-CM | POA: Diagnosis not present

## 2016-12-27 DIAGNOSIS — I509 Heart failure, unspecified: Secondary | ICD-10-CM | POA: Diagnosis not present

## 2016-12-27 HISTORY — DX: Calculus of kidney: N20.0

## 2016-12-27 LAB — CULTURE, URINE COMPREHENSIVE

## 2016-12-27 NOTE — Patient Instructions (Signed)
Your procedure is scheduled on: 12/29/16 Wed Report to Same Day Surgery 2nd floor medical mall [facility] Entrance-take elevator on left to 2nd floor.  Check in with surgery information desk.) To find out your arrival time please call [phone] between 1PM - 3PM on 12/28/16 Tues  Remember: Instructions that are not followed completely may result in serious medical risk, up to and including death, or upon the discretion of your surgeon and anesthesiologist your surgery may need to be rescheduled.    _x___ 1. Do not eat food after midnight the night before your procedure. You may drink clear liquids up to 2 hours before you are scheduled to arrive at the hospital for your procedure.  Do not drink clear liquids within 2 hours of your scheduled arrival to the hospital.  Clear liquids include  --Water or Apple juice without pulp  --Clear carbohydrate beverage such as ClearFast or Gatorade  --Black Coffee or Clear Tea (No milk, no creamers, do not add anything to                  the coffee or Tea Type 1 and type 2 diabetics should only drink water.  No gum chewing or hard candies.     __x__ 2. No Alcohol for 24 hours before or after surgery.   __x__3. No Smoking for 24 prior to surgery.   ____  4. Bring all medications with you on the day of surgery if instructed.    __x__ 5. Notify your doctor if there is any change in your medical condition     (cold, fever, infections).     Do not wear jewelry, make-up, hairpins, clips or nail polish.  Do not wear lotions, powders, or perfumes. You may wear deodorant.  Do not shave 48 hours prior to surgery. Men may shave face and neck.  Do not bring valuables to the hospital.    [facility] is not responsible for any belongings or valuables.               Contacts, dentures or bridgework may not be worn into surgery.  Leave your suitcase in the car. After surgery it may be brought to your room.  For patients admitted to the hospital, discharge  time is determined by your                       treatment team.   Patients discharged the day of surgery will not be allowed to drive home.  You will need someone to drive you home and stay with you the night of your procedure.    Please read over the following fact sheets that you were given:   [facility] Preparing for Surgery and or MRSA Information   _x___ Take anti-hypertensive listed below, cardiac, seizure, asthma,     anti-reflux and psychiatric medicines. These include:  1. metoprolol tartrate (LOPRESSOR  2.omeprazole (PRILOSEC) 20 MG capsule  3.sertraline (ZOLOFT) 25 MG tablet  4.  5.  6.  ____Fleets enema or Magnesium Citrate as directed.   _x___ Use CHG Soap or sage wipes as directed on instruction sheet   ____ Use inhalers on the day of surgery and bring to hospital day of surgery  ____ Stop Metformin and Janumet 2 days prior to surgery.    ____ Take 1/2 of usual insulin dose the night before surgery and none on the morning     surgery.   _x___ Follow recommendations from Cardiologist, Pulmonologist or  PCP regarding          stopping Aspirin, Coumadin, Plavix ,Eliquis, Effient, or Pradaxa, and Pletal.  Stop Xarelto 3 days before surgery per doctor order  X____Stop Anti-inflammatories such as Advil, Aleve, Ibuprofen, Motrin, Naproxen, Naprosyn, Goodies powders or aspirin products. OK to take Tylenol and                          Celebrex.   _x___ Stop supplements until after surgery.  But may continue Vitamin D, Vitamin B,       and multivitamin.   ____ Bring C-Pap to the hospital.

## 2016-12-28 MED ORDER — CEFAZOLIN SODIUM-DEXTROSE 1-4 GM/50ML-% IV SOLN: 1.0000 g | INTRAVENOUS | Status: DC

## 2016-12-29 ENCOUNTER — Ambulatory Visit: Payer: Medicare Other | Admitting: Anesthesiology

## 2016-12-29 ENCOUNTER — Encounter: Payer: Self-pay | Admitting: *Deleted

## 2016-12-29 ENCOUNTER — Ambulatory Visit
Admission: RE | Admit: 2016-12-29 | Discharge: 2016-12-29 | Disposition: A | Discharge status: Home / Self Care | Payer: Medicare Other | Source: Ambulatory Visit | Attending: Urology | Admitting: Urology

## 2016-12-29 ENCOUNTER — Encounter
Admission: RE | Disposition: A | Discharge status: Home / Self Care | Payer: Self-pay | Source: Ambulatory Visit | Attending: Urology

## 2016-12-29 DIAGNOSIS — I509 Heart failure, unspecified: Secondary | ICD-10-CM | POA: Diagnosis not present

## 2016-12-29 DIAGNOSIS — Z90710 Acquired absence of both cervix and uterus: Secondary | ICD-10-CM | POA: Insufficient documentation

## 2016-12-29 DIAGNOSIS — R310 Gross hematuria: Secondary | ICD-10-CM | POA: Diagnosis not present

## 2016-12-29 DIAGNOSIS — K760 Fatty (change of) liver, not elsewhere classified: Secondary | ICD-10-CM | POA: Insufficient documentation

## 2016-12-29 DIAGNOSIS — R0600 Dyspnea, unspecified: Secondary | ICD-10-CM | POA: Insufficient documentation

## 2016-12-29 DIAGNOSIS — R0902 Hypoxemia: Secondary | ICD-10-CM | POA: Diagnosis not present

## 2016-12-29 DIAGNOSIS — K449 Diaphragmatic hernia without obstruction or gangrene: Secondary | ICD-10-CM | POA: Diagnosis not present

## 2016-12-29 DIAGNOSIS — J449 Chronic obstructive pulmonary disease, unspecified: Secondary | ICD-10-CM | POA: Diagnosis not present

## 2016-12-29 DIAGNOSIS — Z7901 Long term (current) use of anticoagulants: Secondary | ICD-10-CM | POA: Insufficient documentation

## 2016-12-29 DIAGNOSIS — E8881 Metabolic syndrome: Secondary | ICD-10-CM | POA: Insufficient documentation

## 2016-12-29 DIAGNOSIS — I700 Atherosclerosis of aorta: Secondary | ICD-10-CM | POA: Insufficient documentation

## 2016-12-29 DIAGNOSIS — Z87891 Personal history of nicotine dependence: Secondary | ICD-10-CM | POA: Insufficient documentation

## 2016-12-29 DIAGNOSIS — N132 Hydronephrosis with renal and ureteral calculous obstruction: Secondary | ICD-10-CM | POA: Insufficient documentation

## 2016-12-29 DIAGNOSIS — I482 Chronic atrial fibrillation: Secondary | ICD-10-CM | POA: Diagnosis not present

## 2016-12-29 DIAGNOSIS — D72829 Elevated white blood cell count, unspecified: Secondary | ICD-10-CM | POA: Diagnosis not present

## 2016-12-29 DIAGNOSIS — Z6839 Body mass index (BMI) 39.0-39.9, adult: Secondary | ICD-10-CM | POA: Insufficient documentation

## 2016-12-29 DIAGNOSIS — N201 Calculus of ureter: Secondary | ICD-10-CM | POA: Diagnosis not present

## 2016-12-29 DIAGNOSIS — M1990 Unspecified osteoarthritis, unspecified site: Secondary | ICD-10-CM | POA: Insufficient documentation

## 2016-12-29 DIAGNOSIS — K219 Gastro-esophageal reflux disease without esophagitis: Secondary | ICD-10-CM | POA: Insufficient documentation

## 2016-12-29 DIAGNOSIS — I110 Hypertensive heart disease with heart failure: Secondary | ICD-10-CM | POA: Insufficient documentation

## 2016-12-29 DIAGNOSIS — E669 Obesity, unspecified: Secondary | ICD-10-CM | POA: Insufficient documentation

## 2016-12-29 DIAGNOSIS — I081 Rheumatic disorders of both mitral and tricuspid valves: Secondary | ICD-10-CM | POA: Insufficient documentation

## 2016-12-29 DIAGNOSIS — I4891 Unspecified atrial fibrillation: Secondary | ICD-10-CM | POA: Diagnosis not present

## 2016-12-29 DIAGNOSIS — K580 Irritable bowel syndrome with diarrhea: Secondary | ICD-10-CM | POA: Insufficient documentation

## 2016-12-29 DIAGNOSIS — Z8589 Personal history of malignant neoplasm of other organs and systems: Secondary | ICD-10-CM | POA: Insufficient documentation

## 2016-12-29 DIAGNOSIS — Z87442 Personal history of urinary calculi: Secondary | ICD-10-CM | POA: Diagnosis not present

## 2016-12-29 DIAGNOSIS — Z881 Allergy status to other antibiotic agents status: Secondary | ICD-10-CM | POA: Insufficient documentation

## 2016-12-29 DIAGNOSIS — Z8543 Personal history of malignant neoplasm of ovary: Secondary | ICD-10-CM | POA: Insufficient documentation

## 2016-12-29 DIAGNOSIS — G4733 Obstructive sleep apnea (adult) (pediatric): Secondary | ICD-10-CM | POA: Insufficient documentation

## 2016-12-29 HISTORY — PX: URETEROSCOPY: SHX842

## 2016-12-29 HISTORY — PX: CYSTOSCOPY W/ URETERAL STENT PLACEMENT: SHX1429

## 2016-12-29 HISTORY — PX: STONE EXTRACTION WITH BASKET: SHX5318

## 2016-12-29 SURGERY — URETEROSCOPY
Anesthesia: General | Site: Ureter | Laterality: Left | Wound class: Clean Contaminated

## 2016-12-29 MED ORDER — OXYCODONE HCL 5 MG PO TABS: 5.0000 mg | ORAL_TABLET | Freq: Once | ORAL | 0 refills | Status: DC | PRN

## 2016-12-29 MED ORDER — GLYCOPYRROLATE 0.2 MG/ML IJ SOLN
INTRAMUSCULAR | Status: AC
Start: 2016-12-29 — End: 2016-12-29
  Filled 2016-12-29: qty 1

## 2016-12-29 MED ORDER — LIDOCAINE HCL (PF) 2 % IJ SOLN
INTRAMUSCULAR | Status: AC
  Filled 2016-12-29: qty 10

## 2016-12-29 MED ORDER — FENTANYL CITRATE (PF) 100 MCG/2ML IJ SOLN
INTRAMUSCULAR | Status: AC
  Administered 2016-12-29: 25 ug via INTRAVENOUS
  Filled 2016-12-29: qty 2

## 2016-12-29 MED ORDER — DEXAMETHASONE SODIUM PHOSPHATE 10 MG/ML IJ SOLN
INTRAMUSCULAR | Status: DC | PRN
  Administered 2016-12-29: 5 mg via INTRAVENOUS

## 2016-12-29 MED ORDER — OXYCODONE HCL 5 MG/5ML PO SOLN: 5.0000 mg | Freq: Once | ORAL | Status: DC | PRN

## 2016-12-29 MED ORDER — GLYCOPYRROLATE 0.2 MG/ML IJ SOLN
INTRAMUSCULAR | Status: DC | PRN
  Administered 2016-12-29: 0.2 mg via INTRAVENOUS

## 2016-12-29 MED ORDER — OXYCODONE HCL 5 MG PO TABS: 5.0000 mg | ORAL_TABLET | Freq: Once | ORAL | Status: DC | PRN

## 2016-12-29 MED ORDER — FENTANYL CITRATE (PF) 100 MCG/2ML IJ SOLN
INTRAMUSCULAR | Status: DC | PRN
  Administered 2016-12-29 (×2): 25 ug via INTRAVENOUS

## 2016-12-29 MED ORDER — PHENYLEPHRINE HCL 10 MG/ML IJ SOLN
INTRAMUSCULAR | Status: DC | PRN
  Administered 2016-12-29: 100 ug via INTRAVENOUS

## 2016-12-29 MED ORDER — ONDANSETRON HCL 4 MG/2ML IJ SOLN
INTRAMUSCULAR | Status: AC
  Filled 2016-12-29: qty 2

## 2016-12-29 MED ORDER — FENTANYL CITRATE (PF) 100 MCG/2ML IJ SOLN
25.0000 ug | INTRAMUSCULAR | Status: DC | PRN
  Administered 2016-12-29: 25 ug via INTRAVENOUS

## 2016-12-29 MED ORDER — ONDANSETRON HCL 4 MG/2ML IJ SOLN
INTRAMUSCULAR | Status: DC | PRN
  Administered 2016-12-29: 4 mg via INTRAVENOUS

## 2016-12-29 MED ORDER — PROPOFOL 10 MG/ML IV BOLUS
INTRAVENOUS | Status: DC | PRN
  Administered 2016-12-29: 150 mg via INTRAVENOUS

## 2016-12-29 MED ORDER — MIDAZOLAM HCL 2 MG/2ML IJ SOLN
INTRAMUSCULAR | Status: DC | PRN
  Administered 2016-12-29: 2 mg via INTRAVENOUS

## 2016-12-29 MED ORDER — MIDAZOLAM HCL 2 MG/2ML IJ SOLN
INTRAMUSCULAR | Status: AC
  Filled 2016-12-29: qty 2

## 2016-12-29 MED ORDER — DEXAMETHASONE SODIUM PHOSPHATE 10 MG/ML IJ SOLN
INTRAMUSCULAR | Status: AC
  Filled 2016-12-29: qty 1

## 2016-12-29 MED ORDER — LACTATED RINGERS IV SOLN
INTRAVENOUS | Status: DC
  Administered 2016-12-29: 12:00:00 via INTRAVENOUS

## 2016-12-29 MED ORDER — LIDOCAINE HCL (CARDIAC) 20 MG/ML IV SOLN
INTRAVENOUS | Status: DC | PRN
  Administered 2016-12-29: 100 mg via INTRAVENOUS

## 2016-12-29 MED ORDER — CEFAZOLIN SODIUM-DEXTROSE 2-4 GM/100ML-% IV SOLN
2.0000 g | INTRAVENOUS | Status: AC
  Administered 2016-12-29: 2 g via INTRAVENOUS

## 2016-12-29 MED ORDER — CEFAZOLIN SODIUM-DEXTROSE 2-4 GM/100ML-% IV SOLN
INTRAVENOUS | Status: AC
  Filled 2016-12-29: qty 100

## 2016-12-29 MED ORDER — IOTHALAMATE MEGLUMINE 43 % IV SOLN
INTRAVENOUS | Status: DC | PRN
Start: 2016-12-29 — End: 2016-12-29
  Administered 2016-12-29: 15 mL via URETHRAL

## 2016-12-29 MED ORDER — FENTANYL CITRATE (PF) 100 MCG/2ML IJ SOLN
INTRAMUSCULAR | Status: AC
  Filled 2016-12-29: qty 2

## 2016-12-29 SURGICAL SUPPLY — 32 items
BAG DRAIN CYSTO-URO LG1000N (MISCELLANEOUS) ×3 IMPLANT
BASKET ZERO TIP 1.9FR (BASKET) ×2 IMPLANT
BRUSH SCRUB EZ 1% IODOPHOR (MISCELLANEOUS) ×3 IMPLANT
BSKT STON RTRVL ZERO TP 1.9FR (BASKET) ×2
CATH URETL 5X70 OPEN END (CATHETERS) ×3 IMPLANT
CNTNR SPEC 2.5X3XGRAD LEK (MISCELLANEOUS)
CONRAY 43 FOR UROLOGY 50M (MISCELLANEOUS) ×3 IMPLANT
CONT SPEC 4OZ STER OR WHT (MISCELLANEOUS)
CONT SPEC 4OZ STRL OR WHT (MISCELLANEOUS)
CONTAINER SPEC 2.5X3XGRAD LEK (MISCELLANEOUS) IMPLANT
DRAPE UTILITY 15X26 TOWEL STRL (DRAPES) ×3 IMPLANT
FIBER LASER LITHO 273 (Laser) IMPLANT
GLOVE BIO SURGEON STRL SZ 6.5 (GLOVE) ×3 IMPLANT
GOWN STRL REUS W/ TWL LRG LVL3 (GOWN DISPOSABLE) ×4 IMPLANT
GOWN STRL REUS W/TWL LRG LVL3 (GOWN DISPOSABLE) ×6
GUIDEWIRE GREEN .038 145CM (MISCELLANEOUS) IMPLANT
INFUSOR MANOMETER BAG 3000ML (MISCELLANEOUS) ×3 IMPLANT
INTRODUCER DILATOR DOUBLE (INTRODUCER) IMPLANT
KIT RM TURNOVER CYSTO AR (KITS) ×3 IMPLANT
PACK CYSTO AR (MISCELLANEOUS) ×3 IMPLANT
SENSORWIRE 0.038 NOT ANGLED (WIRE) ×6
SET CYSTO W/LG BORE CLAMP LF (SET/KITS/TRAYS/PACK) ×3 IMPLANT
SHEATH URETERAL 12FRX35CM (MISCELLANEOUS) IMPLANT
SOL .9 NS 3000ML IRR  AL (IV SOLUTION) ×1
SOL .9 NS 3000ML IRR AL (IV SOLUTION) ×2
SOL .9 NS 3000ML IRR UROMATIC (IV SOLUTION) ×2 IMPLANT
STENT URET 6FRX22 CONTOUR (STENTS) ×2 IMPLANT
STENT URET 6FRX24 CONTOUR (STENTS) IMPLANT
STENT URET 6FRX26 CONTOUR (STENTS) IMPLANT
SURGILUBE 2OZ TUBE FLIPTOP (MISCELLANEOUS) ×3 IMPLANT
WATER STERILE IRR 1000ML POUR (IV SOLUTION) ×3 IMPLANT
WIRE SENSOR 0.038 NOT ANGLED (WIRE) ×4 IMPLANT

## 2016-12-29 NOTE — Discharge Instructions (Signed)
You have a ureteral stent in place.  This is a tube that extends from your kidney to your bladder.  This may cause urinary bleeding, burning with urination, and urinary frequency.  Please call our office or present to the ED if you develop fevers >101 or pain which is not able to be controlled with oral pain medications.  You may be given either Flomax and/ or ditropan to help with bladder spasms and stent pain in addition to pain medications.    Your stent is then advanced on a string. This is attached in the standard fashion. On Sunday, and tape and pulled gently until the entire stent is removed.      [facility] [address], [place], [place] [address] [phone]        AMBULATORY SURGERY  DISCHARGE INSTRUCTIONS   1) The drugs that you were given will stay in your system until tomorrow so for the next 24 hours you should not:  A) Drive an automobile B) Make any legal decisions C) Drink any alcoholic beverage   2) You may resume regular meals tomorrow.  Today it is better to start with liquids and gradually work up to solid foods.  You may eat anything you prefer, but it is better to start with liquids, then soup and crackers, and gradually work up to solid foods.   3) Please notify your doctor immediately if you have any unusual bleeding, trouble breathing, redness and pain at the surgery site, drainage, fever, or pain not relieved by medication.    4) Additional Instructions:        Please contact your physician with any problems or Same Day Surgery at [phone], Monday through Friday 6 am to 4 pm, or [place] [facility] number at [phone].

## 2016-12-29 NOTE — Anesthesia Preprocedure Evaluation (Addendum)
Anesthesia Evaluation  Patient identified by MRN, date of birth, ID band Patient awake    Reviewed: Allergy & Precautions, H&P , NPO status , Patient's Chart, lab work & pertinent test results  History of Anesthesia Complications (+) PONV, Family history of anesthesia reaction and history of anesthetic complications  Airway Mallampati: III  TM Distance: <3 FB Neck ROM: limited    Dental  (+) Poor Dentition, Chipped, Missing, Caps   Pulmonary neg shortness of breath, sleep apnea and Continuous Positive Airway Pressure Ventilation , COPD, former smoker,           Cardiovascular Exercise Tolerance: Good hypertension, (-) angina+CHF  (-) Past MI and (-) DOE + dysrhythmias Atrial Fibrillation      Neuro/Psych negative neurological ROS  negative psych ROS   GI/Hepatic Neg liver ROS, GERD  ,  Endo/Other  negative endocrine ROS  Renal/GU Renal disease     Musculoskeletal  (+) Arthritis ,   Abdominal   Peds  Hematology negative hematology ROS (+)   Anesthesia Other Findings Past Medical History: No date: A-fib (HCC) No date: Abnormal Pap smear of vagina and vaginal HPV No date: Arthritis     Comment:  "bones ache" (01/23/2015) No date: Cancer ([place])     Comment:  vulvar cancer No date: CHF (congestive heart failure) (HCC) No date: Chronic anticoagulation     Comment:  She is on Xarelto for Afib No date: Chronic atrial fibrillation (HCC) No date: Dyspnea No date: Echocardiogram abnormal     Comment:  Mitral regurgitation, tricuspic regurgitation No date: Family history of adverse reaction to anesthesia     Comment:  sister gets very nauseated and vomits No date: GERD (gastroesophageal reflux disease) No date: History of kidney stones No date: History of stress test     Comment:  Myoview 8/16: EF 42%, anteroseptal, inferoseptal,               anterior, apical anterior and apical defect (likely                represents breast attenuation versus scar), no ischemia;               intermediate risk because of low EF No date: HPV (human papilloma virus) anogenital infection     Comment:  03/21/12 PAP + HR HPV No date: Hypertension No date: Insulin resistance No date: Kidney stone No date: Kidney stones No date: Obesity No date: OSA on CPAP 1983: Postoperative nausea and vomiting     Comment:  many years ago with hysterectomy No date: Urolithiasis 2016: Vulvar cancer ([place])     Comment:  invasive verrucous carcinoma of the vulva. History of               genital warts and vulvar condyloma  Past Surgical History: No date: ABDOMINAL HYSTERECTOMY No date: APPENDECTOMY 2003: BILATERAL SALPINGOOPHORECTOMY     Comment:  benign ovarian cancer 2014-2015: CARDIOVERSION     Comment:  "[facility]" 06/10/2015: COLONOSCOPY WITH PROPOFOL; N/A     Comment:  Procedure: COLONOSCOPY WITH PROPOFOL;  Surgeon: Lucilla Lame, MD;  Location: [facility] ENDOSCOPY;  Service: Endoscopy;              Laterality: N/A; 10/19/2016: CYSTOSCOPY W/ RETROGRADES; Bilateral     Comment:  Procedure: CYSTOSCOPY WITH RETROGRADE PYELOGRAM;                Surgeon: Erlene Quan,  Caryl Pina, MD;  Location: [facility] ORS;                Service: Urology;  Laterality: Bilateral; 10/19/2016: CYSTOSCOPY WITH BIOPSY; N/A     Comment:  Procedure: CYSTOSCOPY WITH BLADDER BIOPSY, VAGINAL WALL               BIOPSY;  Surgeon: Hollice Espy, MD;  Location: [facility]               ORS;  Service: Urology;  Laterality: N/A; 12/14/2016: CYSTOSCOPY WITH STENT PLACEMENT; Left     Comment:  Procedure: CYSTOSCOPY WITH STENT PLACEMENT;  Surgeon:               Hollice Espy, MD;  Location: [facility] ORS;  Service:               Urology;  Laterality: Left; 10/05/2016: ESOPHAGOGASTRODUODENOSCOPY (EGD) WITH PROPOFOL; N/A     Comment:  Surgeon: Lucilla Lame, MD;  Location: [facility] ENDOSCOPY;                Results: Barrett's Esophagus- repeat in 3 years 09/2019 No  date: LITHOTRIPSY No date: TONSILLECTOMY 05/10/2014: VULVECTOMY; Right     Comment:  Excisional biopsy of the superior right labial majora               mass; [facility] 07/02/2014: VULVECTOMY PARTIAL     Comment:  Re-excision and sentinel node dissection at [facility].      Reproductive/Obstetrics negative OB ROS                            Anesthesia Physical Anesthesia Plan  ASA: III  Anesthesia Plan: General LMA   Post-op Pain Management:    Induction: Intravenous  PONV Risk Score and Plan: 4 or greater and Ondansetron, Dexamethasone and Treatment may vary due to age or medical condition  Airway Management Planned: LMA  Additional Equipment:   Intra-op Plan:   Post-operative Plan: Extubation in OR  Informed Consent: I have reviewed the patients History and Physical, chart, labs and discussed the procedure including the risks, benefits and alternatives for the proposed anesthesia with the patient or authorized representative who has indicated his/her understanding and acceptance.   Dental Advisory Given  Plan Discussed with: Anesthesiologist, CRNA and Surgeon  Anesthesia Plan Comments: (Patient reports that she will remove her contact lenses before the case  Patient consented for risks of anesthesia including but not limited to:  - adverse reactions to medications - damage to teeth, lips or other oral mucosa - sore throat or hoarseness - Damage to heart, brain, lungs or loss of life  Patient voiced understanding.)        Anesthesia Quick Evaluation

## 2016-12-29 NOTE — H&P (View-Only) (Signed)
12/22/2016 12:49 PM   Antonella Blinda Leatherwood January 08, 1946 [id number]  Referring provider: Juline Patch, MD [address] [place], [place] [address]  Chief Complaint  Patient presents with  . Follow-up    2 week follow up by stone management    HPI: Patient is a 71 year old Caucasian female who was recently hospitalized for an obstructing 5 mm ureteral stone associated with heart arrhythmias and leukocytosis concerning for sepsis.  Background history Virlee Kylani Wires is a 71 y.o. year old with a 5 mm obstructing left proximal ureteral stone with moderate hydroureteronephrosis admitted to the hospitalist service last night in the setting of a positive urinalysis, leukocytosis, episode of A. Fib with RVR and hypoxia concerning for sepsis. This morning, she spent a low-grade temp to 100.5.   She reports that she is been experiencing left lower quadrant pain with associated nausea and vomiting for at least 4 days prior to the admission. She also has generalized malaise and fatigue.  She did have gross hematuria following her bladder biopsy which subsequently resolved. She denies any dysuria.  She is status post left ureteral stent placement with Dr. Erlene Quan on 12/14/2016.    Today, she is having gross hematuria, urgency, frequency and diarrhea.  She has not had fevers, chills, nausea or vomiting.  She is not experiencing any flank pain.  She is not passed any stone fragments.  Her UA today is 8 trach positive and greater than 30 RBCs per high-power field.  She will finish her Keflex today.    She states the diarrhea began during hospitalization.  Upon further questioning she does have IBS and can have episodes of diarrhea, she does states it is worse since being in the hospital.  She also had questions as to why her Lasix and Avapro were discontinued upon discharge from the hospital.        PMH: Past Medical History:  Diagnosis Date  . A-fib ([place])   . Abnormal Pap smear  of vagina and vaginal HPV   . Arthritis    "bones ache" (01/23/2015)  . Cancer ([place])    vulvar cancer  . CHF (congestive heart failure) ([place])   . Chronic anticoagulation    She is on Xarelto for Afib  . Chronic atrial fibrillation ([place])   . Dyspnea   . Echocardiogram abnormal    Mitral regurgitation, tricuspic regurgitation  . Family history of adverse reaction to anesthesia    sister gets very nauseated and vomits  . GERD (gastroesophageal reflux disease)   . History of kidney stones   . History of stress test    Myoview 8/16: EF 42%, anteroseptal, inferoseptal, anterior, apical anterior and apical defect (likely represents breast attenuation versus scar), no ischemia; intermediate risk because of low EF  . HPV (human papilloma virus) anogenital infection    03/21/12 PAP + HR HPV  . Hypertension   . Insulin resistance   . Kidney stones   . Obesity   . OSA on CPAP   . Postoperative nausea and vomiting 1983   many years ago with hysterectomy  . Urolithiasis   . Vulvar cancer ([place]) 2016   invasive verrucous carcinoma of the vulva. History of genital warts and vulvar condyloma    Surgical History: Past Surgical History:  Procedure Laterality Date  . ABDOMINAL HYSTERECTOMY    . APPENDECTOMY    . BILATERAL SALPINGOOPHORECTOMY  2003   benign ovarian cancer  . CARDIOVERSION  2014-2015   "[facility]"  .  COLONOSCOPY WITH PROPOFOL N/A 06/10/2015   Procedure: COLONOSCOPY WITH PROPOFOL;  Surgeon: Lucilla Lame, MD;  Location: [facility];  Service: Endoscopy;  Laterality: N/A;  . CYSTOSCOPY W/ RETROGRADES Bilateral 10/19/2016   Procedure: CYSTOSCOPY WITH RETROGRADE PYELOGRAM;  Surgeon: Hollice Espy, MD;  Location: [facility] ORS;  Service: Urology;  Laterality: Bilateral;  . CYSTOSCOPY WITH BIOPSY N/A 10/19/2016   Procedure: CYSTOSCOPY WITH BLADDER BIOPSY, VAGINAL WALL BIOPSY;  Surgeon: Hollice Espy, MD;  Location: [facility] ORS;  Service: Urology;  Laterality: N/A;  . CYSTOSCOPY  WITH STENT PLACEMENT Left 12/14/2016   Procedure: CYSTOSCOPY WITH STENT PLACEMENT;  Surgeon: Hollice Espy, MD;  Location: [facility] ORS;  Service: Urology;  Laterality: Left;  . ESOPHAGOGASTRODUODENOSCOPY (EGD) WITH PROPOFOL N/A 10/05/2016   Surgeon: Lucilla Lame, MD;  Location: [facility] END[facility];  Results: Barrett's Esophagus- repeat in 3 years 09/2019  . LITHOTRIPSY    . TONSILLECTOMY    . VULVECTOMY Right 05/10/2014   Excisional biopsy of the superior right labial majora mass; [place] PARTIAL  07/02/2014   Re-excision and sentinel node dissection at [facility].     Home Medications:  Allergies as of 12/22/2016      Reactions   Ciprofloxacin Other (See Comments)   SOB      Medication List       Accurate as of 12/22/16 12:49 PM. Always use your most recent med list.          acetaminophen 325 MG tablet Commonly known as:  TYLENOL Take 2 tablets (650 mg total) by mouth every 6 (six) hours as needed for mild pain (or Fever >/= 101).   albuterol 108 (90 Base) MCG/ACT inhaler Commonly known as:  PROVENTIL HFA;VENTOLIN HFA Inhale 2 puffs into the lungs every 8 (eight) hours as needed for wheezing or shortness of breath.   cephALEXin 500 MG capsule Commonly known as:  KEFLEX Take 1 capsule (500 mg total) by mouth 3 (three) times daily.   dicyclomine 10 MG capsule Commonly known as:  BENTYL Take 10 mg by mouth 4 (four) times daily -  before meals and at bedtime.   metoprolol tartrate 100 MG tablet Commonly known as:  LOPRESSOR Take 150 mg by mouth 2 (two) times daily.   mirabegron ER 25 MG Tb24 tablet Commonly known as:  MYRBETRIQ Take 1 tablet (25 mg total) by mouth daily.   omeprazole 20 MG capsule Commonly known as:  PRILOSEC Take 20 mg by mouth daily.   oxybutynin 5 MG tablet Commonly known as:  DITROPAN Take 1 tablet (5 mg total) by mouth every 8 (eight) hours as needed for bladder spasms.   oxyCODONE 5 MG immediate release tablet Commonly known as:   Oxy IR/ROXICODONE Take 1 tablet (5 mg total) by mouth every 6 (six) hours as needed for moderate pain, severe pain or breakthrough pain.   rivaroxaban 20 MG Tabs tablet Commonly known as:  XARELTO Take 20 mg by mouth daily with supper.   sertraline 25 MG tablet Commonly known as:  ZOLOFT Take 25 mg by mouth daily.   vitamin B-12 1000 MCG tablet Commonly known as:  CYANOCOBALAMIN Take 1,000 mcg by mouth every other day.       Allergies:  Allergies  Allergen Reactions  . Ciprofloxacin Other (See Comments)    SOB    Family History: Family History  Problem Relation Age of Onset  . Prostate cancer Brother 38       still living and well  . Heart attack Father   .  Stroke Mother   . Diabetes Mother   . Hypertension Mother   . Heart attack Brother   . Pancreatic cancer Brother   . Healthy Sister   . Breast cancer Neg Hx   . Kidney cancer Neg Hx   . Bladder Cancer Neg Hx     Social History:  reports that she quit smoking about 4 years ago. Her smoking use included Cigarettes and E-cigarettes. She has a 50.00 pack-year smoking history. She has never used smokeless tobacco. She reports that she does not drink alcohol or use drugs.  ROS: UROLOGY Frequent Urination?: No Hard to postpone urination?: No Burning/pain with urination?: No Get up at night to urinate?: No Leakage of urine?: No Urine stream starts and stops?: No Trouble starting stream?: No Do you have to strain to urinate?: No Blood in urine?: Yes Urinary tract infection?: No Sexually transmitted disease?: No Injury to kidneys or bladder?: No Painful intercourse?: No Weak stream?: No Currently pregnant?: No Vaginal bleeding?: No Last menstrual period?: n  Gastrointestinal Nausea?: No Vomiting?: No Indigestion/heartburn?: No Diarrhea?: No Constipation?: No  Constitutional Fever: No Night sweats?: No Weight loss?: No Fatigue?: No  Skin Skin rash/lesions?: No Itching?: No  Eyes Blurred  vision?: No Double vision?: No  Ears/Nose/Throat Sore throat?: No Sinus problems?: No  Hematologic/Lymphatic Swollen glands?: No Easy bruising?: No  Cardiovascular Leg swelling?: No Chest pain?: No  Respiratory Cough?: No Shortness of breath?: No  Endocrine Excessive thirst?: No  Musculoskeletal Back pain?: No Joint pain?: No  Neurological Headaches?: No Dizziness?: No  Psychologic Depression?: No Anxiety?: No  Physical Exam: BP (!) 159/101   Pulse 89   Ht 5\' 1"  (1.549 m)   Wt 213 lb (96.6 kg)   BMI 40.25 kg/m   Constitutional: Well nourished. Alert and oriented, No acute distress. HEENT: [place] AT, moist mucus membranes. Trachea midline, no masses. Cardiovascular: No clubbing, cyanosis, or edema. Respiratory: Normal respiratory effort, no increased work of breathing. GI: Abdomen is soft, non tender, non distended, no abdominal masses. Liver and spleen not palpable.  No hernias appreciated.  Stool sample for occult testing is not indicated.   GU: No CVA tenderness.  No bladder fullness or masses.   Skin: No rashes, bruises or suspicious lesions. Lymph: No cervical or inguinal adenopathy. Neurologic: Grossly intact, no focal deficits, moving all 4 extremities. Psychiatric: Normal mood and affect.  Laboratory Data: Lab Results  Component Value Date   WBC 8.7 12/16/2016   HGB 11.4 (L) 12/16/2016   HCT 33.1 (L) 12/16/2016   MCV 91.0 12/16/2016   PLT 173 12/16/2016    Lab Results  Component Value Date   CREATININE 0.89 12/16/2016     Lab Results  Component Value Date   HGBA1C 6.6 (H) 12/15/2016    Lab Results  Component Value Date   TSH 2.050 09/07/2016       Component Value Date/Time   CHOL 165 03/18/2016 1056   HDL 42 03/18/2016 1056   CHOLHDL 3.9 03/18/2016 1056   CHOLHDL 4.4 01/22/2015 0455   VLDL 21 01/22/2015 0455   LDLCALC 89 03/18/2016 1056    Lab Results  Component Value Date   AST 17 12/13/2016   Lab Results  Component  Value Date   ALT 11 (L) 12/13/2016    Urinalysis > 30 RBC's.   Nitrite positive.  See EPIC.  I have reviewed the labs.   Pertinent Imaging: CLINICAL DATA:  71 year old female with left abdominal and pelvic pain for 5 days  with vomiting. History of vulvar cancer.  EXAM: CT ABDOMEN AND PELVIS WITH CONTRAST  TECHNIQUE: Multidetector CT imaging of the abdomen and pelvis was performed using the standard protocol following bolus administration of intravenous contrast.  CONTRAST:  153mL ISOVUE-300 IOPAMIDOL (ISOVUE-300) INJECTION 61%  COMPARISON:  01/28/2015 CT  FINDINGS: Lower chest: No acute abnormality  Hepatobiliary: Mild hepatic steatosis noted. Gallbladder is unremarkable. No biliary dilatation.  Pancreas: Unremarkable  Spleen: Unremarkable  Adrenals/Urinary Tract: A 5 mm proximal left ureteral calculus causes moderate left hydronephrosis and perinephric inflammation. The right kidney, adrenal glands and bladder are unremarkable.  Stomach/Bowel: No bowel obstruction, bowel wall thickening or inflammatory changes. Colonic diverticulosis noted without diverticulitis. A small to moderate hiatal hernia is present. The appendix is normal.  Vascular/Lymphatic: Aortic atherosclerosis. No enlarged abdominal or pelvic lymph nodes.  Reproductive: Status post hysterectomy. No adnexal masses.  Other: No ascites, focal collection or pneumoperitoneum.  Musculoskeletal: No acute or significant osseous findings.  IMPRESSION: 1. Obstructing 5 mm proximal left ureteral calculus causing moderate left hydronephrosis. 2. Small-moderate hiatal hernia 3. Mild hepatic steatosis 4.  Aortic Atherosclerosis (ICD10-I70.0).   Electronically Signed   By: Margarette Canada M.D.   On: 12/13/2016 17:11 I have independently reviewed the films.    Assessment & Plan:    1. Left ureteral stone   - s/p left ureteral stent placement  - schedule left ureteroscopy with laser  lithotripsy and ureteral stent exchange - will need cardiac clearance prior to procedure to stop Xarelto  - explained to the patient how the procedure is performed and the risks involved  - informed patient that they will have a stent placed during the procedure and will remain in place after the procedure for a short time.   - stent may be removed in the office with a cystoscope or patient may be instructed to remove the stent themselves by the string  - described "stent pain" as feelings of needing to urinate/overactive bladder and a warm, tingling sensation to intense pain in the affected flank  - residual stones within the kidney or ureter may be present after the procedure and may need to have these addressed at a different encounter  - injury to the ureter is the most common intra-operative risk, it may result in an open procedure to correct the defect  - infection and bleeding are also risks  - explained the risks of general anesthesia, such as: MI, CVA, paralysis, coma and/or death.  - advised to contact our office or seek treatment in the ED if becomes febrile or pain/ vomiting are difficult control in order to arrange for emergent/urgent intervention   2. Left hydronephrosis  - obtain RUS to ensure the hydronephrosis has resolved once she has recovered from URS  3. Microscopic hematuria  - UA today demonstrates > 30 RBC's and nitrite positive - we'll send for culture - we will hold on prescribing an antibiotic due to her increase of diarrhea until culture result is available ? Possible C.diff  - continue to monitor the patient's UA after the treatment/passage of the stone to ensure the hematuria has resolved  - if hematuria persists, we will pursue a hematuria workup with CT Urogram and cystoscopy if appropriate.  4. Diarrhea  - Patient will contact her GI physician regarding further management of her diarrhea as she has IBS   5. CHF  - patient will contact her cardiologist  regarding her questions concerning the Lasix and Avapro   Return for left URS/LL/ureteral stent  exchange.  These notes generated with voice recognition software. I apologize for typographical errors.  Zara Council, [facility] [address] [place], [place] [address] [phone]

## 2016-12-29 NOTE — Anesthesia Procedure Notes (Signed)
Procedure Name: LMA Insertion Date/Time: 12/29/2016 1:41 PM Performed by: Doreen Salvage Pre-anesthesia Checklist: Patient identified, Patient being monitored, Timeout performed, Emergency Drugs available and Suction available Patient Re-evaluated:Patient Re-evaluated prior to induction Oxygen Delivery Method: Circle system utilized Preoxygenation: Pre-oxygenation with 100% oxygen Induction Type: IV induction Ventilation: Mask ventilation without difficulty LMA: LMA inserted LMA Size: 4.5 Tube type: Oral Number of attempts: 1 Placement Confirmation: positive ETCO2 and breath sounds checked- equal and bilateral Tube secured with: Tape Dental Injury: Teeth and Oropharynx as per pre-operative assessment

## 2016-12-29 NOTE — Op Note (Signed)
Date of procedure: 12/29/16  Preoperative diagnosis:  1. Left ureteral calculus 2. History of urosepsis   Postoperative diagnosis:  1. Same as above   Procedure: 1. Left ureteroscopy with basket extraction of stent 2. Left ureteral stent exchange  Surgeon: Hollice Espy, MD  Anesthesia: General  Complications: None  Intraoperative findings: 5 mm left proximal ureteral stone identified, extracted without fragmentation via basket. Stent exchange.  EBL: Minimal  Specimens: stone   Drains: 6 x 22 French ureteral stent on left (stent left in string)  Indication: Holly Smith is a 70 y.o. patient with 5 mm obstructing left proximal ureteral stone who previously underwent emergent ureteral stenting in the setting of sepsis. She returns today for definitive management of her stone..  After reviewing the management options for treatment, she elected to proceed with the above surgical procedure(s). We have discussed the potential benefits and risks of the procedure, side effects of the proposed treatment, the likelihood of the patient achieving the goals of the procedure, and any potential problems that might occur during the procedure or recuperation. Informed consent has been obtained.  Description of procedure:  The patient was taken to the operating room and general anesthesia was induced.  The patient was placed in the dorsal lithotomy position, prepped and draped in the usual sterile fashion, and preoperative antibiotics were administered. A preoperative time-out was performed.   A 21 French cystoscope was advanced per urethra into the bladder. Seizures into the left ureteral orifice right ureteral stent was seen emanating. The distal coil of the stent was grasped and brought to the level of the urethral meatus. The stent was then cannulated using a sensor at the level of the kidney leaving the line placement thereby removing the stent. This is done to place a safety wire. A 4.5  semirigid ureteroscope was advanced up to level of the proximal ureter where the stone could be seen partially embedded into the wall of the ureter.  It is somewhat oblong in shape and appeared to be amenable to basket. His telemetry freed off the wall of the ureter and extended using 1.9 [place] tipless nitinol basket. Since able to removed in entirety and passed off field to stone specimen. The scope was then readvanced the level of the stone and there is no injuries appreciated or any residual stone fragments. Scope was then removed. A 6 x 22 French double-J ureteral stent was then advanced over the level of the kidney. Wires percent onto a partial clot was noted looped over the lower pole calyx. The wire was then completely withdrawn a focal as noted within the bladder. The bladder was then drained using the sheath of the scope. This string was left attached to the stent. It is affixed to the patient's left thigh using Mastisol and Tegaderm. She is then created, repositioned the supine position, reversed anesthesia, taken to PACU in stable condition.  Plan: Patient will remove her stent on Sunday. She'll return to the office in 1 month with renal ultrasound just prior.  Hollice Espy, M.D.

## 2016-12-29 NOTE — Transfer of Care (Signed)
Immediate Anesthesia Transfer of Care Note  Patient: Holly Smith  Procedure(s) Performed: Procedure(s): CYSTOSCOPY/URETEROSCOPY/HOLMIUM LASER/STENT EXCHANGE (Left)  Patient Location: PACU  Anesthesia Type:General  Level of Consciousness: sedated  Airway & Oxygen Therapy: Patient Spontanous Breathing and Patient connected to face mask oxygen  Post-op Assessment: Report given to RN and Post -op Vital signs reviewed and stable  Post vital signs: Reviewed and stable  Last Vitals:  Vitals:   12/29/16 1217 12/29/16 1412  BP: (!) 180/103 122/84  Pulse: 76 94  Resp: 20 (!) 24  Temp: 36.8 C 36.7 C  SpO2: 87% 21%    Complications: No apparent anesthesia complications

## 2016-12-29 NOTE — Anesthesia Post-op Follow-up Note (Signed)
Anesthesia QCDR form completed.        

## 2016-12-29 NOTE — Interval H&P Note (Signed)
History and Physical Interval Note:  12/29/2016 1:18 PM  Holly Smith  has presented today for surgery, with the diagnosis of Left ureteral stone  The various methods of treatment have been discussed with the patient and family. After consideration of risks, benefits and other options for treatment, the patient has consented to  Procedure(s): CYSTOSCOPY/URETEROSCOPY/HOLMIUM LASER/STENT EXCHANGE (Left) as a surgical intervention .  The patient's history has been reviewed, patient examined, no change in status, stable for surgery.  I have reviewed the patient's chart and labs.  Questions were answered to the patient's satisfaction.    RRR CTAB  Hollice Espy

## 2016-12-29 NOTE — Anesthesia Postprocedure Evaluation (Signed)
Anesthesia Post Note  Patient: Holly Smith  Procedure(s) Performed: URETEROSCOPY (Left Ureter) CYSTOSCOPY WITH STENT REPLACEMENT (Left Ureter) STONE EXTRACTION WITH BASKET (Left Ureter)  Patient location during evaluation: PACU Anesthesia Type: General Level of consciousness: awake and alert Pain management: pain level controlled Vital Signs Assessment: post-procedure vital signs reviewed and stable Respiratory status: spontaneous breathing, nonlabored ventilation, respiratory function stable and patient connected to nasal cannula oxygen Cardiovascular status: blood pressure returned to baseline and stable Postop Assessment: no apparent nausea or vomiting Anesthetic complications: no     Last Vitals:  Vitals:   12/29/16 1500 12/29/16 1516  BP: (!) 149/87 (!) 156/79  Pulse: 84   Resp: 16   Temp:    SpO2: 96%     Last Pain:  Vitals:   12/29/16 1427  TempSrc:   PainSc: 4                  Kynzi Levay K Jairon Ripberger

## 2016-12-30 ENCOUNTER — Encounter: Payer: Self-pay | Admitting: Urology

## 2017-01-01 ENCOUNTER — Emergency Department
Admission: EM | Admit: 2017-01-01 | Discharge: 2017-01-01 | Disposition: A | Discharge status: Home / Self Care | Payer: Medicare Other | Attending: Emergency Medicine | Admitting: Emergency Medicine

## 2017-01-01 ENCOUNTER — Encounter: Payer: Self-pay | Admitting: Emergency Medicine

## 2017-01-01 DIAGNOSIS — J449 Chronic obstructive pulmonary disease, unspecified: Secondary | ICD-10-CM | POA: Diagnosis not present

## 2017-01-01 DIAGNOSIS — I5042 Chronic combined systolic (congestive) and diastolic (congestive) heart failure: Secondary | ICD-10-CM | POA: Diagnosis not present

## 2017-01-01 DIAGNOSIS — Z87412 Personal history of vulvar dysplasia: Secondary | ICD-10-CM | POA: Diagnosis not present

## 2017-01-01 DIAGNOSIS — Z87891 Personal history of nicotine dependence: Secondary | ICD-10-CM | POA: Insufficient documentation

## 2017-01-01 DIAGNOSIS — Z79899 Other long term (current) drug therapy: Secondary | ICD-10-CM | POA: Insufficient documentation

## 2017-01-01 DIAGNOSIS — I110 Hypertensive heart disease with heart failure: Secondary | ICD-10-CM | POA: Diagnosis not present

## 2017-01-01 DIAGNOSIS — R319 Hematuria, unspecified: Secondary | ICD-10-CM

## 2017-01-01 DIAGNOSIS — Z7901 Long term (current) use of anticoagulants: Secondary | ICD-10-CM | POA: Diagnosis not present

## 2017-01-01 LAB — URINALYSIS, COMPLETE (UACMP) WITH MICROSCOPIC
BILIRUBIN URINE: NEGATIVE
GLUCOSE, UA: NEGATIVE mg/dL
KETONES UR: NEGATIVE mg/dL
Nitrite: NEGATIVE
PROTEIN: 100 mg/dL — AB
Specific Gravity, Urine: 1.011 (ref 1.005–1.030)
pH: 8 (ref 5.0–8.0)

## 2017-01-01 LAB — CBC
HEMATOCRIT: 39.9 % (ref 35.0–47.0)
Hemoglobin: 13.3 g/dL (ref 12.0–16.0)
MCH: 30.6 pg (ref 26.0–34.0)
MCHC: 33.4 g/dL (ref 32.0–36.0)
MCV: 91.8 fL (ref 80.0–100.0)
PLATELETS: 244 10*3/uL (ref 150–440)
RBC: 4.34 MIL/uL (ref 3.80–5.20)
RDW: 14.4 % (ref 11.5–14.5)
WBC: 10 10*3/uL (ref 3.6–11.0)

## 2017-01-01 LAB — BASIC METABOLIC PANEL
Anion gap: 12 (ref 5–15)
BUN: 15 mg/dL (ref 6–20)
CALCIUM: 9.1 mg/dL (ref 8.9–10.3)
CO2: 28 mmol/L (ref 22–32)
CREATININE: 0.81 mg/dL (ref 0.44–1.00)
Chloride: 98 mmol/L — ABNORMAL LOW (ref 101–111)
GFR calc Af Amer: >60 mL/min (ref >60)
GLUCOSE: 116 mg/dL — AB (ref 65–99)
Potassium: 3.1 mmol/L — ABNORMAL LOW (ref 3.5–5.1)
Sodium: 138 mmol/L (ref 135–145)

## 2017-01-01 MED ORDER — ACETAMINOPHEN 325 MG PO TABS
ORAL_TABLET | ORAL | Status: AC
  Filled 2017-01-01: qty 2

## 2017-01-01 MED ORDER — ACETAMINOPHEN 325 MG PO TABS
650.0000 mg | ORAL_TABLET | Freq: Once | ORAL | Status: AC
  Administered 2017-01-01: 650 mg via ORAL

## 2017-01-01 NOTE — ED Notes (Signed)
ED Provider at bedside. 

## 2017-01-01 NOTE — ED Provider Notes (Addendum)
[facility]  ____________________________________________   I have reviewed the triage vital signs and the nursing notes.   HISTORY  Chief Complaint Hematuria    HPI Holly Smith is a 71 y.o. female With a history of atrial fibrillation on anticoagulation, Xarelto, who restarted her Xarelto yesterday and began to have bleeding and clot passage immediately thereafter from her ureteral stent was was placed for a kidney stone on the 10th of this month. Patient states she has had decreased urination since 1:00 this afternoon and she has ongoing discomfort. She is supposed to pull out the ureteral stent tomorrow. She is here because she is concerned about the hematuria that she has been having since starting her Xarelto, and her decreased urination. She denies any fever or chills nausea vomiting. She has had "a little bit" of urine during the course of the day but not a significant amount according to her. She is taking normal by mouth. She feels that she may have some degree of obstruction. She has had pain since her seizure and that seems to be about constant. Nothing makes her symptoms today better or worse and she has no other alleviating or remitting symptoms       Past Medical History:  Diagnosis Date  . A-fib ([place])   . Abnormal Pap smear of vagina and vaginal HPV   . Arthritis    "bones ache" (01/23/2015)  . Cancer ([place])    vulvar cancer  . CHF (congestive heart failure) ([place])   . Chronic anticoagulation    She is on Xarelto for Afib  . Chronic atrial fibrillation ([place])   . Dyspnea   . Echocardiogram abnormal    Mitral regurgitation, tricuspic regurgitation  . Family history of adverse reaction to anesthesia    sister gets very nauseated and vomits  . GERD (gastroesophageal reflux disease)   . History of kidney stones   . History of stress test    Myoview 8/16: EF 42%, anteroseptal, inferoseptal,  anterior, apical anterior and apical defect (likely represents breast attenuation versus scar), no ischemia; intermediate risk because of low EF  . HPV (human papilloma virus) anogenital infection    03/21/12 PAP + HR HPV  . Hypertension   . Insulin resistance   . Kidney stone   . Kidney stones   . Obesity   . OSA on CPAP   . Postoperative nausea and vomiting 1983   many years ago with hysterectomy  . Urolithiasis   . Vulvar cancer ([place]) 2016   invasive verrucous carcinoma of the vulva. History of genital warts and vulvar condyloma    Patient Active Problem List   Diagnosis Date Noted  . Sepsis ([place]) 12/13/2016  . Dyspepsia   . Gastritis without bleeding   . Other specified diseases of esophagus   . History of alcohol use 09/07/2016  . Hypertensive heart disease 05/12/2016  . Atrial fibrillation, persistent ([place]) 08/13/2015  . COPD (chronic obstructive pulmonary disease) ([place]) 08/13/2015  . Personal history of other specified conditions 08/13/2015  . Insulin resistance 08/13/2015  . Obesity, unspecified 08/13/2015  . Special screening for malignant neoplasms, colon   . Benign neoplasm of ascending colon   . Benign neoplasm of descending colon   . Benign neoplasm of sigmoid colon   . Chronic diastolic heart failure ([place]) 05/05/2015  . Diverticulitis 01/31/2015  . Diverticulitis large intestine w/o perforation or abscess w/bleeding 01/28/2015  . Chronic combined systolic and diastolic CHF, NYHA class  3 ([place]) 01/28/2015  . Acute diverticulitis 01/28/2015  . Diverticulitis of large intestine without perforation or abscess with bleeding   . Acute on chronic diastolic heart failure ([place]) 01/21/2015  . Hypertensive urgency 01/21/2015  . Morbid obesity ([place]) 01/21/2015  . Blood in stool 01/21/2015  . Acute diastolic CHF (congestive heart failure) ([place]) 01/21/2015  . CAFL (chronic airflow limitation) ([place]) 12/16/2014  . Personal history of perinatal problems 12/16/2014  . HTN  (hypertension) 12/16/2014  . Hyperlipidemia, unspecified 12/16/2014  . Dysmetabolic syndrome 76/16/0737  . Kidney stones 12/16/2014  . PVC (premature ventricular contraction) 12/16/2014  . Snoring 12/16/2014  . Stress incontinence 12/16/2014  . Fast heart beat 12/16/2014  . Calculus (=stone) 12/16/2014  . OSA (obstructive sleep apnea) 11/27/2014  . Dyspnea 10/07/2014  . Essential hypertension 10/07/2014  . Obesity 10/07/2014  . Genital warts 08/02/2014  . Condyloma acuminatum of vulva 08/02/2014  . Condylomata lata of vulva 08/02/2014  . Anticoagulation adequate 08/02/2014  . Chronic atrial fibrillation ([place]) 08/02/2014  . Current tobacco use 08/02/2014  . 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 Malignant neoplasm of vulva ([place]) 06/19/2014  . Vulvar cancer ([place]) 05/10/2014  . Vulvar cancer, carcinoma ([place]) 05/10/2014  . 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 Chest pain 08/20/2013  . Breath shortness 08/20/2013  . CCF (congestive cardiac failure) ([place]) 08/03/2013  . CHF (congestive heart failure) ([place]) 08/03/2013  . H/O transesophageal echocardiography (TEE) for monitoring 06/21/2013  . Echocardiogram abnormal 04/23/2013  . Mitral regurgitation 04/23/2013  . TI (tricuspid incompetence) 04/23/2013    Past Surgical History:  Procedure Laterality Date  . ABDOMINAL HYSTERECTOMY    . APPENDECTOMY    . BILATERAL SALPINGOOPHORECTOMY  2003   benign ovarian cancer  . CARDIOVERSION  2014-2015   "[place]"  . COLONOSCOPY WITH PROPOFOL N/A 06/10/2015   Procedure: COLONOSCOPY WITH PROPOFOL;  Surgeon: Lucilla Lame, MD;  Location: [facility] ENDOSCOPY;  Service: Endoscopy;  Laterality: N/A;  . CYSTOSCOPY W/ RETROGRADES Bilateral 10/19/2016   Procedure: CYSTOSCOPY WITH RETROGRADE PYELOGRAM;  Surgeon: Hollice Espy, MD;  Location: [facility] ORS;  Service: Urology;  Laterality: Bilateral;  . CYSTOSCOPY W/ URETERAL STENT PLACEMENT Left 12/29/2016   Procedure: CYSTOSCOPY  WITH STENT REPLACEMENT;  Surgeon: Hollice Espy, MD;  Location: [facility] ORS;  Service: Urology;  Laterality: Left;  . CYSTOSCOPY WITH BIOPSY N/A 10/19/2016   Procedure: CYSTOSCOPY WITH BLADDER BIOPSY, VAGINAL WALL BIOPSY;  Surgeon: Hollice Espy, MD;  Location: [facility] ORS;  Service: Urology;  Laterality: N/A;  . CYSTOSCOPY WITH STENT PLACEMENT Left 12/14/2016   Procedure: CYSTOSCOPY WITH STENT PLACEMENT;  Surgeon: Hollice Espy, MD;  Location: [facility] ORS;  Service: Urology;  Laterality: Left;  . ESOPHAGOGASTRODUODENOSCOPY (EGD) WITH PROPOFOL N/A 10/05/2016   Surgeon: Lucilla Lame, MD;  Location: [facility] ENDOSCOPY;  Results: Barrett's Esophagus- repeat in 3 years 09/2019  . LITHOTRIPSY    . STONE EXTRACTION WITH BASKET Left 12/29/2016   Procedure: STONE EXTRACTION WITH BASKET;  Surgeon: Hollice Espy, MD;  Location: [facility] ORS;  Service: Urology;  Laterality: Left;  . TONSILLECTOMY    . URETEROSCOPY Left 12/29/2016   Procedure: URETEROSCOPY;  Surgeon: Hollice Espy, MD;  Location: [facility] ORS;  Service: Urology;  Laterality: Left;  [facility] VULVECTOMY Right 05/10/2014   Excisional biopsy of the superior right labial majora mass; [place] PARTIAL  07/02/2014   Re-excision and sentinel node dissection at [facility].     Prior to Admission medications   Medication Sig Start Date End Date  Taking? Authorizing Provider  acetaminophen (TYLENOL) 325 MG tablet Take 2 tablets (650 mg total) by mouth every 6 (six) hours as needed for mild pain (or Fever >/= 101). 12/16/16   Gouru, Illene Silver, MD  albuterol (PROVENTIL HFA;VENTOLIN HFA) 108 (90 Base) MCG/ACT inhaler Inhale 2 puffs into the lungs every 8 (eight) hours as needed for wheezing or shortness of breath. 04/29/16   Juline Patch, MD  dicyclomine (BENTYL) 10 MG capsule Take 10 mg by mouth 4 (four) times daily -  before meals and at bedtime.    [provider]  furosemide (LASIX) 40 MG tablet Take 80 mg by mouth daily.    [provider]  irbesartan (AVAPRO) 75 MG tablet Take 37.5 mg by mouth daily.    [provider]  metoprolol tartrate (LOPRESSOR) 100 MG tablet Take 150 mg by mouth 2 (two) times daily.    [provider]  mirabegron ER (MYRBETRIQ) 25 MG TB24 tablet Take 1 tablet (25 mg total) by mouth daily. 10/26/16   Hollice Espy, MD  omeprazole (PRILOSEC) 20 MG capsule Take 20 mg by mouth daily.    [provider]  oxybutynin (DITROPAN) 5 MG tablet Take 1 tablet (5 mg total) by mouth every 8 (eight) hours as needed for bladder spasms. 10/19/16   Hollice Espy, MD  oxyCODONE (OXY IR/ROXICODONE) 5 MG immediate release tablet Take 1 tablet (5 mg total) by mouth every 6 (six) hours as needed for moderate pain, severe pain or breakthrough pain. 12/16/16   Gouru, Illene Silver, MD  oxyCODONE (OXY IR/ROXICODONE) 5 MG immediate release tablet Take 1 tablet (5 mg total) by mouth once as needed (for pain score of 1-4). 12/29/16   Hollice Espy, MD  rivaroxaban (XARELTO) 20 MG TABS tablet Take 20 mg by mouth daily with supper.    [provider]  sertraline (ZOLOFT) 25 MG tablet Take 25 mg by mouth daily.    [provider]  triamcinolone (NASACORT ALLERGY 24HR) 55 MCG/ACT AERO nasal inhaler Place 2 sprays into the nose daily.    [provider]  vitamin B-12 (CYANOCOBALAMIN) 1000 MCG tablet Take 1,000 mcg by mouth every other day.     [provider]    Allergies Ciprofloxacin  Family History  Problem Relation Age of Onset  . Prostate cancer Brother 71       still living and well  . Heart attack Father   . Stroke Mother   . Diabetes Mother   . Hypertension Mother   . Heart attack Brother   . Pancreatic cancer Brother   . Healthy Sister   . Breast cancer Neg Hx   . Kidney cancer Neg Hx   . Bladder Cancer Neg Hx     Social History Social History  Substance Use Topics  . Smoking status: Former Smoker    Packs/day: 1.00    Years: 50.00    Types: Cigarettes,  E-cigarettes    Quit date: 05/20/2012  . Smokeless tobacco: Never Used  . Alcohol use No     Comment: 01/23/2015 "I' used to drink socially a few times/month"    Review of Systems  Constitutional: No fever/chills Eyes: No visual changes. ENT: No sore throat. No stiff neck no neck pain Cardiovascular: Denies chest pain. Respiratory: Denies shortness of breath. Gastrointestinal:   no vomiting.  No diarrhea.  No constipation. Genitourinary: Negative for dysuria.positive hematuria Musculoskeletal: Negative lower extremity swelling Skin: Negative for rash. Neurological: Negative for severe headaches, focal weakness or  numbness.   ____________________________________________   PHYSICAL EXAM:  VITAL SIGNS: ED Triage Vitals  Enc Vitals Group     BP 01/01/17 1526 (!) 159/97     Pulse Rate 01/01/17 1526 80     Resp 01/01/17 1526 20     Temp 01/01/17 1526 98.1 F (36.7 C)     Temp Source 01/01/17 1526 Oral     SpO2 01/01/17 1526 95 %     Weight 01/01/17 1526 210 lb (95.3 kg)     Height --      Head Circumference --      Peak Flow --      Pain Score 01/01/17 1524 8     Pain Loc --      Pain Edu? --      Excl. in [place]? --     Constitutional: Alert and oriented. Well appearing and in no acute distress. Eyes: Conjunctivae are normal Head: Atraumatic HEENT: No congestion/rhinnorhea. Mucous membranes are moist.  Oropharynx non-erythematous Neck:   Nontender with no meningismus, no masses, no stridor Cardiovascular: Normal rate, regular rhythm. Grossly normal heart sounds.  Good peripheral circulation. Respiratory: Normal respiratory effort.  No retractions. Lungs CTAB. Abdominal: Soft and mild suprapubic tenderness with mild left-sided tenderness. No distention. No guarding no rebound Back:  There is no focal tenderness or step off.  there is no midline tenderness there are no lesions noted. there is leftCVA tenderness   Musculoskeletal: No lower extremity tenderness, no upper  extremity tenderness. No joint effusions, no DVT signs strong distal pulses no edema Neurologic:  Normal speech and language. No gross focal neurologic deficits are appreciated.  Skin:  Skin is warm, dry and intact. No rash noted. Psychiatric: Mood and affect are normal. Speech and behavior are normal.  ____________________________________________   LABS (all labs ordered are listed, but only abnormal results are displayed)  Labs Reviewed  URINALYSIS, COMPLETE (UACMP) WITH MICROSCOPIC - Abnormal; Notable for the following:       Result Value   Color, Urine AMBER (*)    APPearance HAZY (*)    Hgb urine dipstick LARGE (*)    Protein, ur 100 (*)    Leukocytes, UA TRACE (*)    Bacteria, UA RARE (*)    Squamous Epithelial / LPF 0-5 (*)    All other components within normal limits  BASIC METABOLIC PANEL - Abnormal; Notable for the following:    Potassium 3.1 (*)    Chloride 98 (*)    Glucose, Bld 116 (*)    All other components within normal limits  CBC    Pertinent labs  results that were available during my care of the patient were reviewed by me and considered in my medical decision making (see chart for details). ____________________________________________  EKG  I personally interpreted any EKGs ordered by me or triage   ____________________________________________  RADIOLOGY  Pertinent labs & imaging results that were available during my care of the patient were reviewed by me and considered in my medical decision making (see chart for details). If possible, patient and/or family made aware of any abnormal findings. ____________________________________________    PROCEDURES  Procedure(s) performed: None  Procedures  Critical Care performed: None  ____________________________________________   INITIAL IMPRESSION / ASSESSMENT AND PLAN / ED COURSE  Pertinent labs & imaging results that were available during my care of the patient were reviewed by me and  considered in my medical decision making (see chart for details).  patient very well-appearing, has hematuria after  restarting her anticoagulation medication in the context of ureteral stent. No fevers or chills. No dysuria. She has had a little bit of urine around the urethral stent at her bladder we are able to catch some of that and send it, it does show trace leuks and blood but no nitrates, we will do a postvoid residual volume with a ultrasound and we will discuss with urology whichsteps he would like [place] to take next.  ----------------------------------------- 8:08 PM on 01/01/2017 -----------------------------------------  d/w dr. Jeffie Pollock, patient does have pain but is declining any pain medication aside from Tylenol. This is the same pain she's had since his stent was placed. urology recommends that I remove the stent as it is scheduled, tomorrow. Patient has been having some drainage in the stent appears to be partially out already. It turns that she hasn't been anorexic today, she actually has been steadily draining in fact. And her postvoid residual shows 25 cc.   ----------------------------------------- 8:16 PM on 01/01/2017 ----------------------------------------- Stent was partially out.  Stent now completely out;  pt tol well no complications, per urology, we  will stop xarelto b/c of bleeding for a few days.  and she will f/u with urology  risks of going off banticoagulation explained to patient, she is comfortable doing so.  ----------------------------------------- 8:44 PM on 01/01/2017 -----------------------------------------  He should in no acute distress she would prefer to go home at this time.    ____________________________________________   FINAL CLINICAL IMPRESSION(S) / ED DIAGNOSES  Final diagnoses:  None      This chart was dictated using voice recognition software.  Despite best efforts to proofread,  errors can occur which can change meaning.       Schuyler Amor, MD 01/01/17 1933    Schuyler Amor, MD 01/01/17 2009    Schuyler Amor, MD 01/01/17 2017    Schuyler Amor, MD 01/01/17 2044

## 2017-01-01 NOTE — ED Notes (Signed)
RN to bedside to perform bladder scan. Patient has large quantity of urine on bedding. Linens changed, patient and bed cleaned, incontinence brief and linen protector placed on bed.

## 2017-01-01 NOTE — ED Notes (Signed)
Stent removed.

## 2017-01-01 NOTE — Discharge Instructions (Signed)
stop taking your Xarelto for a day or 2, follow closely with Dr. Erlene Quan and your primary care doctor if you have severe pain, fever, vomiting, numbness, weakness or any other new or worrisome symptoms return to the emergency department.

## 2017-01-01 NOTE — ED Notes (Signed)
Reviewed d/c instructions, follow-up care with patient. Pt verbalized understanding.  

## 2017-01-01 NOTE — ED Triage Notes (Signed)
Pt to ed with c/o left side lower back pain and flank pain.  Pt states had emergent stent placement on Oct 3rd for large kidney stone, admitted to hospital for 4 days,  then stopped xarelto on Oct 7th, had kidney stone removal of Oct 13th, stent remains in pt at this time, restarted xarelto on Oct 12th and then last night and today, passing large clots through urination.

## 2017-01-03 ENCOUNTER — Telehealth: Payer: Self-pay | Admitting: Urology

## 2017-01-03 NOTE — Telephone Encounter (Signed)
If she is doing fine, follow up as planned.  Hollice Espy, MD

## 2017-01-03 NOTE — Telephone Encounter (Signed)
Patient called in today and said that was feeling a lot better, no blood and was wanting to know if she needed to come in to be seen after her visit from the ED?  Please advise   Sharyn Lull

## 2017-01-06 LAB — STONE ANALYSIS
AMMONIUM ACID URATE: 5 %
CA OXALATE, MONOHYDR.: 30 %
Ca Oxalate,Dihydrate: 5 %
Ca phos cry stone ql IR: 45 %
MAGNESIUM AMMON PHOS: 15 %
Stone Weight KSTONE: 36 mg

## 2017-01-12 ENCOUNTER — Other Ambulatory Visit: Payer: Self-pay | Admitting: Interventional Cardiology

## 2017-01-12 ENCOUNTER — Other Ambulatory Visit: Payer: Self-pay | Admitting: Family Medicine

## 2017-01-12 DIAGNOSIS — F329 Major depressive disorder, single episode, unspecified: Secondary | ICD-10-CM

## 2017-01-12 DIAGNOSIS — F419 Anxiety disorder, unspecified: Secondary | ICD-10-CM

## 2017-01-12 DIAGNOSIS — F32A Depression, unspecified: Secondary | ICD-10-CM

## 2017-01-12 NOTE — Telephone Encounter (Signed)
Note    Returned call to pt to let her know that Dr. Irish Lack stated that it was ok for her to continue her Irbesartan and Lasix, that her labs were stable on the last check.  Pt thanked me for the call.    1:31 PM    Jeanann Lewandowsky, RMA contacted Victoria H. Nikki Dom  December 23, 2016  Jettie Booze, MD  to Drue Novel I, RN     8:06 PM  Note    OK to continue Lasix and irbesartan.  Kidney function was stable on last lab check.           4:04 PM  Cleon Gustin, RN routed this conversation to Jettie Booze, MD . Drue Novel I, RN  Drue Novel I, RN     3:01 PM  Note    Patient calling and states that she was recently in the hospital at [facility] for kidney infection and kidney stone. She states that when they discharged her on 9/27 that they told her to stop her lasix and irbesartan and she does not know why. Patient states that before she went to the hospital she was taking lasix 80 mg QD and irbesartan 37.5 mg QD. She states that she did not want to stop her lasix or irbesartan without consulting with her heart doctor first. She states that she did cut her lasix back to 40 mg QD and has continued to take her irbesartan 37.5 mg QD. Patient also is taking metoprolol tartrate 150 mg BID. Patient states she does not have a way to check her BP at home, but when she was seen by urology yesterday her BP was 159/101 and HR 89. Urology advised her to contact cardiology regarding her lasix and irbesartan. Patient wanting Dr. Irish Lack to advise on lasix and irbesartan.

## 2017-01-27 ENCOUNTER — Ambulatory Visit: Payer: Self-pay | Admitting: Urology

## 2017-01-31 ENCOUNTER — Ambulatory Visit
Admission: RE | Admit: 2017-01-31 | Discharge: 2017-01-31 | Disposition: A | Discharge status: Home / Self Care | Payer: Medicare Other | Source: Ambulatory Visit | Attending: Urology | Admitting: Urology

## 2017-01-31 DIAGNOSIS — N201 Calculus of ureter: Secondary | ICD-10-CM | POA: Diagnosis not present

## 2017-02-09 ENCOUNTER — Ambulatory Visit (INDEPENDENT_AMBULATORY_CARE_PROVIDER_SITE_OTHER): Payer: Medicare Other | Admitting: Urology

## 2017-02-09 ENCOUNTER — Encounter: Payer: Self-pay | Admitting: Urology

## 2017-02-09 VITALS — BP 158/72 | HR 87 | Ht 60.0 in | Wt 213.0 lb

## 2017-02-09 DIAGNOSIS — N3281 Overactive bladder: Secondary | ICD-10-CM | POA: Diagnosis not present

## 2017-02-09 DIAGNOSIS — Z87442 Personal history of urinary calculi: Secondary | ICD-10-CM

## 2017-02-09 NOTE — Progress Notes (Signed)
02/09/2017 2:22 PM   Holly Smith 10-20-1945 [id number]  Referring provider: Juline Patch, MD [address] [place], [place] [address]  Chief Complaint  Patient presents with  . RUS    1 month    HPI: 71 year old female with left obstructing ureteral calculus who recently underwent definitive management of her stone on 12/29/2016.  She initially presented with urosepsis and underwent stenting followed by a prolonged hospital admission with pulmonary complications.  Ultimately, she was discharged, her infection was treated, she returned to the operating room electively to treat her stone.  She did have one emergency room visit shortly following surgery for gross hematuria.  This resolved after her stent was removed.  She denies any ongoing gross hematuria, dysuria, flank pain, or any other urinary symptoms.  Follow-up renal ultrasound on 01/31/2017 shows no hydronephrosis bilaterally or stones in the kidneys.  Stone composition consistent with calcium oxalate dihydrate 5%, calcium oxalate monohydrate 30%, calcium phosphate 45%, magnesium ammonium phosphate 15%, and ammonium acid urate 5%.  Prior to this, she has a very remote history of kidney stones.  She has not had an episode for many years.  She has had lithotripsy in the past for stones.  She does also have a personal history of overactive bladder.  She was previously prescribed Myrbetriq 25 mg.  She reports most recently, her urinary symptoms have improved dramatically and she no longer needs this medication.  This is a personal history of vulvar cancer.  She was referred earlier this year for evaluation of possible urethral abnormality.  Incidental bladder findings were noted biopsied, and negative for malignancy.  PMH: Past Medical History:  Diagnosis Date  . A-fib ([place])   . Abnormal Pap smear of vagina and vaginal HPV   . Arthritis    "bones ache" (01/23/2015)  . Cancer ([place])    vulvar cancer  .  CHF (congestive heart failure) ([place])   . Chronic anticoagulation    She is on Xarelto for Afib  . Chronic atrial fibrillation ([place])   . Dyspnea   . Echocardiogram abnormal    Mitral regurgitation, tricuspic regurgitation  . Family history of adverse reaction to anesthesia    sister gets very nauseated and vomits  . GERD (gastroesophageal reflux disease)   . History of kidney stones   . History of stress test    Myoview 8/16: EF 42%, anteroseptal, inferoseptal, anterior, apical anterior and apical defect (likely represents breast attenuation versus scar), no ischemia; intermediate risk because of low EF  . HPV (human papilloma virus) anogenital infection    03/21/12 PAP + HR HPV  . Hypertension   . Insulin resistance   . Kidney stone   . Kidney stones   . Obesity   . OSA on CPAP   . Postoperative nausea and vomiting 1983   many years ago with hysterectomy  . Urolithiasis   . Vulvar cancer ([place]) 2016   invasive verrucous carcinoma of the vulva. History of genital warts and vulvar condyloma    Surgical History: Past Surgical History:  Procedure Laterality Date  . ABDOMINAL HYSTERECTOMY    . APPENDECTOMY    . BILATERAL SALPINGOOPHORECTOMY  2003   benign ovarian cancer  . CARDIOVERSION  2014-2015   "[facility]"  . COLONOSCOPY WITH PROPOFOL N/A 06/10/2015   Procedure: COLONOSCOPY WITH PROPOFOL;  Surgeon: Lucilla Lame, MD;  Location: [facility];  Service: Endoscopy;  Laterality: N/A;  . CYSTOSCOPY W/ RETROGRADES Bilateral 10/19/2016   Procedure: CYSTOSCOPY WITH  RETROGRADE PYELOGRAM;  Surgeon: Hollice Espy, MD;  Location: [facility];  Service: Urology;  Laterality: Bilateral;  . CYSTOSCOPY W/ URETERAL STENT PLACEMENT Left 12/29/2016   Procedure: CYSTOSCOPY WITH STENT REPLACEMENT;  Surgeon: Hollice Espy, MD;  Location: [facility];  Service: Urology;  Laterality: Left;  . CYSTOSCOPY WITH BIOPSY N/A 10/19/2016   Procedure: CYSTOSCOPY WITH BLADDER BIOPSY, VAGINAL WALL BIOPSY;   Surgeon: Hollice Espy, MD;  Location: [facility];  Service: Urology;  Laterality: N/A;  . CYSTOSCOPY WITH STENT PLACEMENT Left 12/14/2016   Procedure: CYSTOSCOPY WITH STENT PLACEMENT;  Surgeon: Hollice Espy, MD;  Location: [facility];  Service: Urology;  Laterality: Left;  . ESOPHAGOGASTRODUODENOSCOPY (EGD) WITH PROPOFOL N/A 10/05/2016   Surgeon: Lucilla Lame, MD;  Location: [facility];  Results: Barrett's Esophagus- repeat in 3 years 09/2019  . LITHOTRIPSY    . STONE EXTRACTION WITH BASKET Left 12/29/2016   Procedure: STONE EXTRACTION WITH BASKET;  Surgeon: Hollice Espy, MD;  Location: [facility];  Service: Urology;  Laterality: Left;  . TONSILLECTOMY    . URETEROSCOPY Left 12/29/2016   Procedure: URETEROSCOPY;  Surgeon: Hollice Espy, MD;  Location: [facility];  Service: Urology;  Laterality: Left;  Marland Kitchen VULVECTOMY Right 05/10/2014   Excisional biopsy of the superior right labial majora mass; [place] PARTIAL  07/02/2014   Re-excision and sentinel node dissection at [facility].     Home Medications:  Allergies as of 02/09/2017      Reactions   Ciprofloxacin Other (See Comments)   SOB      Medication List        Accurate as of 02/09/17 11:59 PM. Always use your most recent med list.          acetaminophen 325 MG tablet Commonly known as:  TYLENOL Take 2 tablets (650 mg total) by mouth every 6 (six) hours as needed for mild pain (or Fever >/= 101).   albuterol 108 (90 Base) MCG/ACT inhaler Commonly known as:  PROVENTIL HFA;VENTOLIN HFA Inhale 2 puffs into the lungs every 8 (eight) hours as needed for wheezing or shortness of breath.   furosemide 40 MG tablet Commonly known as:  LASIX Take 80 mg by mouth daily.   irbesartan 75 MG tablet Commonly known as:  AVAPRO Take 37.5 mg by mouth daily.   metoprolol tartrate 100 MG tablet Commonly known as:  LOPRESSOR Take 150 mg by mouth 2 (two) times daily.   NASACORT ALLERGY 24HR 55 MCG/ACT Aero nasal  inhaler Generic drug:  triamcinolone Place 2 sprays into the nose daily.   omeprazole 20 MG capsule Commonly known as:  PRILOSEC Take 20 mg by mouth daily.   rivaroxaban 20 MG Tabs tablet Commonly known as:  XARELTO Take 20 mg by mouth daily with supper.   sertraline 25 MG tablet Commonly known as:  ZOLOFT Take 25 mg by mouth daily.   vitamin B-12 1000 MCG tablet Commonly known as:  CYANOCOBALAMIN Take 1,000 mcg by mouth every other day.       Allergies:  Allergies  Allergen Reactions  . Ciprofloxacin Other (See Comments)    SOB    Family History: Family History  Problem Relation Age of Onset  . Prostate cancer Brother 59       still living and well  . Heart attack Father   . Stroke Mother   . Diabetes Mother   . Hypertension Mother   . Heart attack Brother   . Pancreatic cancer Brother   . Healthy Sister   .  Breast cancer Neg Hx   . Kidney cancer Neg Hx   . Bladder Cancer Neg Hx     Social History:  reports that she quit smoking about 4 years ago. Her smoking use included cigarettes and e-cigarettes. She has a 50.00 pack-year smoking history. she has never used smokeless tobacco. She reports that she does not drink alcohol or use drugs.  ROS: UROLOGY Frequent Urination?: No Hard to postpone urination?: No Burning/pain with urination?: No Get up at night to urinate?: No Leakage of urine?: No Urine stream starts and stops?: No Trouble starting stream?: No Do you have to strain to urinate?: No Blood in urine?: No Urinary tract infection?: No Sexually transmitted disease?: No Injury to kidneys or bladder?: No Painful intercourse?: No Weak stream?: No Currently pregnant?: No Vaginal bleeding?: No Last menstrual period?: n  Gastrointestinal Nausea?: No Vomiting?: No Indigestion/heartburn?: No Diarrhea?: No Constipation?: No  Constitutional Fever: No Night sweats?: No Weight loss?: No Fatigue?: No  Skin Skin rash/lesions?: No Itching?:  No  Eyes Blurred vision?: No Double vision?: No  Ears/Nose/Throat Sore throat?: No Sinus problems?: No  Hematologic/Lymphatic Swollen glands?: No Easy bruising?: No  Cardiovascular Leg swelling?: No Chest pain?: No  Respiratory Cough?: No Shortness of breath?: No  Endocrine Excessive thirst?: No  Musculoskeletal Back pain?: Yes Joint pain?: No  Neurological Headaches?: No Dizziness?: No  Psychologic Depression?: No Anxiety?: No  Physical Exam: BP (!) 158/72   Pulse 87   Ht 5' (1.524 m)   Wt 213 lb (96.6 kg)   BMI 41.60 kg/m   Constitutional:  Alert and oriented, No acute distress. HEENT: [place] AT, moist mucus membranes.  Trachea midline, no masses. Cardiovascular: No clubbing, cyanosis, or edema. Respiratory: Normal respiratory effort, no increased work of breathing. GI: Abdomen is soft, nontender, nondistended, no abdominal masses GU: No CVA tenderness.  Skin: No rashes, bruises or suspicious lesions. Neurologic: Grossly intact, no focal deficits, moving all 4 extremities. Psychiatric: Normal mood and affect.  Laboratory Data: Lab Results  Component Value Date   WBC 10.0 01/01/2017   HGB 13.3 01/01/2017   HCT 39.9 01/01/2017   MCV 91.8 01/01/2017   PLT 244 01/01/2017    Lab Results  Component Value Date   CREATININE 0.81 01/01/2017    Lab Results  Component Value Date   HGBA1C 6.6 (H) 12/15/2016    Urinalysis Lab Results  Component Value Date   SPECGRAV 1.015 12/22/2016   PHUR 5.5 12/22/2016   COLORU Red (A) 12/22/2016   APPEARANCEUR HAZY (A) 01/01/2017   LEUKOCYTESUR TRACE (A) 01/01/2017   PROTEINUR 100 (A) 01/01/2017   GLUCOSEU NEGATIVE 01/01/2017   KETONESU 2+ (A) 12/22/2016   RBCU TOO NUMEROUS TO COUNT 01/01/2017   BILIRUBINUR NEGATIVE 01/01/2017   UUROB 2.0 (H) 12/22/2016   NITRITE NEGATIVE 01/01/2017    Lab Results  Component Value Date   LABMICR See below: 12/22/2016   WBCUA None seen 12/22/2016   RBCUA >30 (H)  12/22/2016   LABEPIT 0-10 12/22/2016   BACTERIA RARE (A) 01/01/2017    Pertinent Imaging: Results for orders placed during the hospital encounter of 01/31/17  US RENAL   Narrative CLINICAL DATA:  Left ureteral stone.  Left ureteroscopy 01/10/2017  EXAM: RENAL / URINARY TRACT ULTRASOUND COMPLETE  COMPARISON:  Abdominal CT 12/13/2016  FINDINGS: Right Kidney:  Length: 12 cm. Echogenicity within normal limits. No mass or hydronephrosis visualized.  Left Kidney:  Length: 11 cm. Echogenicity within normal limits. No mass or hydronephrosis visualized.  Bladder:  Appears normal for degree of bladder distention. Ureteral jet seen on the left at least.  IMPRESSION: Resolved left hydronephrosis.  A left ureteral jet was documented.   Electronically Signed   By: Monte Fantasia M.D.   On: 01/31/2017 15:46    RUS personally reviewed.  Assessment & Plan:    1. History of kidney stones S/p URS for obstructing stone Composition reviewed-component of struvite stone identified Renal ultrasound without any evidence of ongoing hydronephrosis or residual stone burden We discussed general stone prevention techniques including increasing citric acid intake, avoidance of high oxalate containing foods, and decreased salt intake.  Information about dietary recommendations given today.   Unable to increase fluid due to CHF Recommend f/u in 6 months with KUB - DG Abd 1 View; Future  2. OAB (overactive bladder) Improved D/c mybetriq   Return in about 6 months (around 08/09/2017) for KUB.  Hollice Espy, MD  [facility] [address] [place], [place] [address] [phone]

## 2017-02-15 ENCOUNTER — Other Ambulatory Visit: Payer: Self-pay | Admitting: Family Medicine

## 2017-02-15 DIAGNOSIS — F329 Major depressive disorder, single episode, unspecified: Secondary | ICD-10-CM

## 2017-02-15 DIAGNOSIS — F32A Depression, unspecified: Secondary | ICD-10-CM

## 2017-02-15 DIAGNOSIS — F419 Anxiety disorder, unspecified: Secondary | ICD-10-CM

## 2017-02-23 ENCOUNTER — Inpatient Hospital Stay: Payer: Medicare Other | Attending: Obstetrics and Gynecology | Admitting: Obstetrics and Gynecology

## 2017-02-23 VITALS — BP 159/91 | HR 53 | Temp 98.1°F | Resp 18 | Ht 60.0 in | Wt 216.0 lb

## 2017-02-23 DIAGNOSIS — K219 Gastro-esophageal reflux disease without esophagitis: Secondary | ICD-10-CM | POA: Insufficient documentation

## 2017-02-23 DIAGNOSIS — Z7901 Long term (current) use of anticoagulants: Secondary | ICD-10-CM | POA: Diagnosis not present

## 2017-02-23 DIAGNOSIS — I110 Hypertensive heart disease with heart failure: Secondary | ICD-10-CM | POA: Diagnosis not present

## 2017-02-23 DIAGNOSIS — Z87891 Personal history of nicotine dependence: Secondary | ICD-10-CM | POA: Diagnosis not present

## 2017-02-23 DIAGNOSIS — A630 Anogenital (venereal) warts: Secondary | ICD-10-CM | POA: Insufficient documentation

## 2017-02-23 DIAGNOSIS — J449 Chronic obstructive pulmonary disease, unspecified: Secondary | ICD-10-CM | POA: Diagnosis not present

## 2017-02-23 DIAGNOSIS — Z90722 Acquired absence of ovaries, bilateral: Secondary | ICD-10-CM | POA: Diagnosis not present

## 2017-02-23 DIAGNOSIS — Z90710 Acquired absence of both cervix and uterus: Secondary | ICD-10-CM | POA: Insufficient documentation

## 2017-02-23 DIAGNOSIS — C519 Malignant neoplasm of vulva, unspecified: Secondary | ICD-10-CM

## 2017-02-23 DIAGNOSIS — I5042 Chronic combined systolic (congestive) and diastolic (congestive) heart failure: Secondary | ICD-10-CM | POA: Diagnosis not present

## 2017-02-23 DIAGNOSIS — I493 Ventricular premature depolarization: Secondary | ICD-10-CM | POA: Diagnosis not present

## 2017-02-23 DIAGNOSIS — I481 Persistent atrial fibrillation: Secondary | ICD-10-CM | POA: Diagnosis not present

## 2017-02-23 DIAGNOSIS — E785 Hyperlipidemia, unspecified: Secondary | ICD-10-CM

## 2017-02-23 DIAGNOSIS — I340 Nonrheumatic mitral (valve) insufficiency: Secondary | ICD-10-CM | POA: Insufficient documentation

## 2017-02-23 DIAGNOSIS — Z79899 Other long term (current) drug therapy: Secondary | ICD-10-CM | POA: Diagnosis not present

## 2017-02-23 DIAGNOSIS — Z8544 Personal history of malignant neoplasm of other female genital organs: Secondary | ICD-10-CM | POA: Diagnosis not present

## 2017-02-23 NOTE — Progress Notes (Signed)
  Oncology Nurse Navigator Documentation Chaperoned pelvic exam. Follow up in 6 months with Dr. Fransisca Connors Navigator Location: [facility] (02/23/17 1200)   )Navigator Encounter Type: Follow-up Appt (02/23/17 1200)                     Patient Visit Type: GynOnc (02/23/17 1200)   Barriers/Navigation Needs: No barriers at this time (02/23/17 1200)                          Time Spent with Patient: 15 (02/23/17 1200)

## 2017-02-23 NOTE — Progress Notes (Signed)
Pt has no gyn complaints but she did go see Dr. Erlene Quan and she did cystoscopy and bx, she had stones and had to be removed and stented.  She still has bladder issues and following with Erlene Quan.

## 2017-02-23 NOTE — Progress Notes (Signed)
Gynecologic Oncology Interval Note  Referring Provider: Dr. Laverta Baltimore  Chief Concern: Vulvar cancer surveillance.   Subjective:  Holly Smith is a 71 y.o. woman who presents today for continued surveillance for history of invasive vulvar cancer.   She was referred to Dr Erlene Quan in Urology because of abnormal appearing urethral meatus.  Had cysto and only squamous metaplasia found.  Biopsy from vagina showed Lichen Sclerosis.  She had left ureteral stone removed with ureteroscope and got septic and was hospitalized.  No vulvar complaints today.  Still has urinary leakage for which she uses Vesicare.  PAP 3/16 normal.  No gyn complaints.    Oncology Treatment History:  Patient had warty lesion on the upper right labia for about two years.    Hysterectomy in the past for benign disease.  Then BSO in 2003 for benign ovarian tumor.  03/21/12 PAP + HR HPV vaginal bx 2 o'clock negative, 9 o'clock HPV effect  Vulvar itching and pain.  07/18/12 - vulvar bx showed condyloma  9/70/26 - vulvar bx - lichen sclerosis  3/78/58 - Exophytic mass on right anterior vulva. 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  Negative margins, but within 23mm of cancer.  No LVSI.    We discussed the rationale for re-excision of the scar to insure that there is no residual cancer present in view of the close 2 mm margin and sentinel lymph node mapping and biopsy because the tumor has > 56mm invasion.  Had partial right modified radical vulvectomy on 07/02/14 [facility] for invasive verrucous carcinoma of the vulva. The final pathology revealed: negative lymph nodes, no evidence of residual disease.      Problem List: Patient Active Problem List   Diagnosis Date Noted  . Sepsis ([place]) 12/13/2016  . Dyspepsia   . Gastritis without bleeding   . Other specified diseases of esophagus   . History of alcohol use 09/07/2016  . Hypertensive heart  disease 05/12/2016  . Atrial fibrillation, persistent ([place]) 08/13/2015  . COPD (chronic obstructive pulmonary disease) ([place]) 08/13/2015  . Personal history of other specified conditions 08/13/2015  . Insulin resistance 08/13/2015  . Obesity, unspecified 08/13/2015  . Special screening for malignant neoplasms, colon   . Benign neoplasm of ascending colon   . Benign neoplasm of descending colon   . Benign neoplasm of sigmoid colon   . Chronic diastolic heart failure ([place]) 05/05/2015  . Diverticulitis 01/31/2015  . Diverticulitis large intestine w/o perforation or abscess w/bleeding 01/28/2015  . Chronic combined systolic and diastolic CHF, NYHA class 3 ([place]) 01/28/2015  . Acute diverticulitis 01/28/2015  . Diverticulitis of large intestine without perforation or abscess with bleeding   . Acute on chronic diastolic heart failure ([place]) 01/21/2015  . Hypertensive urgency 01/21/2015  . Morbid obesity ([place]) 01/21/2015  . Blood in stool 01/21/2015  . Acute diastolic CHF (congestive heart failure) ([place]) 01/21/2015  . CAFL (chronic airflow limitation) ([place]) 12/16/2014  . Personal history of perinatal problems 12/16/2014  . HTN (hypertension) 12/16/2014  . Hyperlipidemia, unspecified 12/16/2014  . Dysmetabolic syndrome 85/04/7739  . Kidney stones 12/16/2014  . PVC (premature ventricular contraction) 12/16/2014  . Snoring 12/16/2014  . Stress incontinence 12/16/2014  . Fast heart beat 12/16/2014  . Calculus (=stone) 12/16/2014  . OSA (obstructive sleep apnea) 11/27/2014  . Dyspnea 10/07/2014  . Essential hypertension 10/07/2014  . Obesity 10/07/2014  . Genital warts 08/02/2014  . Condyloma acuminatum of vulva 08/02/2014  .  Condylomata lata of vulva 08/02/2014  . Anticoagulation adequate 08/02/2014  . Chronic atrial fibrillation ([place]) 08/02/2014  . Current tobacco use 08/02/2014  . 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 Malignant neoplasm of vulva ([place]) 06/19/2014  . Vulvar cancer  ([place]) 05/10/2014  . Vulvar cancer, carcinoma ([place]) 05/10/2014  . 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 Chest pain 08/20/2013  . Breath shortness 08/20/2013  . CCF (congestive cardiac failure) ([place]) 08/03/2013  . CHF (congestive heart failure) ([place]) 08/03/2013  . H/O transesophageal echocardiography (TEE) for monitoring 06/21/2013  . Echocardiogram abnormal 04/23/2013  . Mitral regurgitation 04/23/2013  . TI (tricuspid incompetence) 04/23/2013    Past Medical History: Past Medical History:  Diagnosis Date  . A-fib ([place])   . Abnormal Pap smear of vagina and vaginal HPV   . Arthritis    "bones ache" (01/23/2015)  . Cancer ([place])    vulvar cancer  . CHF (congestive heart failure) ([place])   . Chronic anticoagulation    She is on Xarelto for Afib  . Chronic atrial fibrillation ([place])   . Dyspnea   . Echocardiogram abnormal    Mitral regurgitation, tricuspic regurgitation  . Family history of adverse reaction to anesthesia    sister gets very nauseated and vomits  . GERD (gastroesophageal reflux disease)   . History of kidney stones   . History of stress test    Myoview 8/16: EF 42%, anteroseptal, inferoseptal, anterior, apical anterior and apical defect (likely represents breast attenuation versus scar), no ischemia; intermediate risk because of low EF  . HPV (human papilloma virus) anogenital infection    03/21/12 PAP + HR HPV  . Hypertension   . Insulin resistance   . Kidney stone   . Kidney stones   . Obesity   . OSA on CPAP   . Postoperative nausea and vomiting 1983   many years ago with hysterectomy  . Urolithiasis   . Vulvar cancer ([place]) 2016   invasive verrucous carcinoma of the vulva. History of genital warts and vulvar condyloma    Past Surgical History: Past Surgical History:  Procedure Laterality Date  . ABDOMINAL HYSTERECTOMY    . APPENDECTOMY    . BILATERAL SALPINGOOPHORECTOMY  2003   benign ovarian cancer  . CARDIOVERSION  2014-2015    "[place]"  . COLONOSCOPY WITH PROPOFOL N/A 06/10/2015   Procedure: COLONOSCOPY WITH PROPOFOL;  Surgeon: Lucilla Lame, MD;  Location: [facility] ENDOSCOPY;  Service: Endoscopy;  Laterality: N/A;  . CYSTOSCOPY W/ RETROGRADES Bilateral 10/19/2016   Procedure: CYSTOSCOPY WITH RETROGRADE PYELOGRAM;  Surgeon: Hollice Espy, MD;  Location: [facility] ORS;  Service: Urology;  Laterality: Bilateral;  . CYSTOSCOPY W/ URETERAL STENT PLACEMENT Left 12/29/2016   Procedure: CYSTOSCOPY WITH STENT REPLACEMENT;  Surgeon: Hollice Espy, MD;  Location: [facility] ORS;  Service: Urology;  Laterality: Left;  . CYSTOSCOPY WITH BIOPSY N/A 10/19/2016   Procedure: CYSTOSCOPY WITH BLADDER BIOPSY, VAGINAL WALL BIOPSY;  Surgeon: Hollice Espy, MD;  Location: [facility] ORS;  Service: Urology;  Laterality: N/A;  . CYSTOSCOPY WITH STENT PLACEMENT Left 12/14/2016   Procedure: CYSTOSCOPY WITH STENT PLACEMENT;  Surgeon: Hollice Espy, MD;  Location: [facility] ORS;  Service: Urology;  Laterality: Left;  . ESOPHAGOGASTRODUODENOSCOPY (EGD) WITH PROPOFOL N/A 10/05/2016   Surgeon: Lucilla Lame, MD;  Location: [facility] ENDOSCOPY;  Results: Barrett's Esophagus- repeat in 3 years 09/2019  . LITHOTRIPSY    . STONE EXTRACTION WITH BASKET Left 12/29/2016   Procedure: STONE EXTRACTION WITH BASKET;  Surgeon: Hollice Espy, MD;  Location: [facility] ORS;  Service: Urology;  Laterality: Left;  . TONSILLECTOMY    . URETEROSCOPY Left 12/29/2016   Procedure: URETEROSCOPY;  Surgeon: Hollice Espy, MD;  Location: [facility] ORS;  Service: Urology;  Laterality: Left;  Marland Kitchen VULVECTOMY Right 05/10/2014   Excisional biopsy of the superior right labial majora mass; [place] PARTIAL  07/02/2014   Re-excision and sentinel node dissection at [facility].     Family History: Family History  Problem Relation Age of Onset  . Prostate cancer Brother 7       still living and well  . Heart attack Father   . Stroke Mother   . Diabetes Mother   . Hypertension  Mother   . Heart attack Brother   . Pancreatic cancer Brother   . Healthy Sister   . Breast cancer Neg Hx   . Kidney cancer Neg Hx   . Bladder Cancer Neg Hx     Social History: Social History   Socioeconomic History  . Marital status: Divorced    Spouse name: Not on file  . Number of children: Not on file  . Years of education: Not on file  . Highest education level: Not on file  Social Needs  . Financial resource strain: Not on file  . Food insecurity - worry: Not on file  . Food insecurity - inability: Not on file  . Transportation needs - medical: Not on file  . Transportation needs - non-medical: Not on file  Occupational History  . Not on file  Tobacco Use  . Smoking status: Former Smoker    Packs/day: 1.00    Years: 50.00    Pack years: 50.00    Types: Cigarettes, E-cigarettes    Last attempt to quit: 05/20/2012    Years since quitting: 4.7  . Smokeless tobacco: Never Used  Substance and Sexual Activity  . Alcohol use: No    Alcohol/week: 0.0 oz    Comment: 01/23/2015 "I' used to drink socially a few times/month"  . Drug use: No  . Sexual activity: Not Currently  Other Topics Concern  . Not on file  Social History Narrative  . Not on file    Allergies: Allergies  Allergen Reactions  . Ciprofloxacin Other (See Comments)    SOB    Current Medications: Current Outpatient Medications  Medication Sig Dispense Refill  . acetaminophen (TYLENOL) 325 MG tablet Take 2 tablets (650 mg total) by mouth every 6 (six) hours as needed for mild pain (or Fever >/= 101).    Marland Kitchen albuterol (PROVENTIL HFA;VENTOLIN HFA) 108 (90 Base) MCG/ACT inhaler Inhale 2 puffs into the lungs every 8 (eight) hours as needed for wheezing or shortness of breath. 1 Inhaler 6  . furosemide (LASIX) 40 MG tablet Take 80 mg by mouth daily.    . irbesartan (AVAPRO) 75 MG tablet Take 37.5 mg by mouth daily.    . metoprolol tartrate (LOPRESSOR) 100 MG tablet Take 150 mg by mouth 2 (two) times daily.     Marland Kitchen omeprazole (PRILOSEC) 20 MG capsule Take 20 mg by mouth daily.    . rivaroxaban (XARELTO) 20 MG TABS tablet Take 20 mg by mouth daily with supper.    . sertraline (ZOLOFT) 25 MG tablet TAKE 1 TABLET BY MOUTH EVERY DAY 30 tablet 1  . triamcinolone (NASACORT ALLERGY 24HR) 55 MCG/ACT AERO nasal inhaler Place 2 sprays into the nose daily as needed.     . vitamin B-12 (CYANOCOBALAMIN) 1000 MCG tablet Take 1,000  mcg by mouth every other day.     . sertraline (ZOLOFT) 25 MG tablet Take 25 mg by mouth daily.     Current Facility-Administered Medications  Medication Dose Route Frequency Provider Last Rate Last Dose  . albuterol (PROVENTIL) (2.5 MG/3ML) 0.083% nebulizer solution 2.5 mg  2.5 mg Nebulization Once Juline Patch, MD          Review of Systems Pertinent items are noted in HPI.  Objective:  Physical Examination:  BP (!) 159/91   Pulse (!) 53   Temp 98.1 F (36.7 C) (Tympanic)   Resp 18   Ht 5' (1.524 m)   Wt 216 lb (98 kg)   BMI 42.18 kg/m   ECOG Performance Status: 0 - Asymptomatic  General appearance: alert, cooperative and appears stated age HEENT:PERRLA, neck supple with midline trachea and thyroid without masses Lymph node survey: non-palpable, axillary, inguinal, supraclavicular Cardiovascular: regular rate and rhythm, no murmurs or gallops Respiratory: normal air entry, lungs clear to auscultation Breast exam: not examined. Abdomen: soft, non-tender, without masses or organomegaly, no hernias and well healed incision Back: inspection of back is normal Extremities: extremities normal, atraumatic, no cyanosis or edema Skin exam - normal coloration and turgor, no rashes, no suspicious skin lesions noted. Neurological exam reveals alert, oriented, normal speech, no focal findings or movement disorder noted.  Pelvic: exam chaperoned by nurse;  Vulva: normal appearing vulva with no masses, tenderness or lesions s/p partial right vulvectomy; Vagina: normal vagina;  Adnexa: normal adnexa in size, nontender and no masses; Rectal: normal rectal, no masses.  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  No evidence of disease.   Vaginal biopsy showed lichen sclerosis, but not symptomatic.   Dr. Erlene Quan in Urology saw her for urethral discoloration and no pathology found, but she had a left ureteral stone removed and got septic in 10/18.  Doing well now.     Plan:   Problem List Items Addressed This Visit      Other   Malignant neoplasm of vulva ([place]) - Primary     Reassurance given with respect to vulvar cancer.  Suggested return to clinic in  6 months.   Can follow up with Dr Erlene Quan in Urology as needed.    Mellody Drown, MD  CC:  Juline Patch, MD [address] [place], [place] [address] [phone]

## 2017-03-11 ENCOUNTER — Emergency Department: Payer: Medicare Other

## 2017-03-11 ENCOUNTER — Other Ambulatory Visit: Payer: Self-pay

## 2017-03-11 ENCOUNTER — Observation Stay
Admission: EM | Admit: 2017-03-11 | Discharge: 2017-03-13 | Disposition: A | Discharge status: Home Health | Payer: Medicare Other | Attending: Internal Medicine | Admitting: Internal Medicine

## 2017-03-11 ENCOUNTER — Encounter: Payer: Self-pay | Admitting: Emergency Medicine

## 2017-03-11 DIAGNOSIS — J209 Acute bronchitis, unspecified: Secondary | ICD-10-CM | POA: Insufficient documentation

## 2017-03-11 DIAGNOSIS — Z7901 Long term (current) use of anticoagulants: Secondary | ICD-10-CM | POA: Insufficient documentation

## 2017-03-11 DIAGNOSIS — I5042 Chronic combined systolic (congestive) and diastolic (congestive) heart failure: Secondary | ICD-10-CM | POA: Insufficient documentation

## 2017-03-11 DIAGNOSIS — E876 Hypokalemia: Secondary | ICD-10-CM | POA: Diagnosis not present

## 2017-03-11 DIAGNOSIS — N390 Urinary tract infection, site not specified: Principal | ICD-10-CM | POA: Insufficient documentation

## 2017-03-11 DIAGNOSIS — Z87442 Personal history of urinary calculi: Secondary | ICD-10-CM | POA: Diagnosis not present

## 2017-03-11 DIAGNOSIS — E785 Hyperlipidemia, unspecified: Secondary | ICD-10-CM | POA: Diagnosis not present

## 2017-03-11 DIAGNOSIS — R42 Dizziness and giddiness: Secondary | ICD-10-CM | POA: Insufficient documentation

## 2017-03-11 DIAGNOSIS — Z79899 Other long term (current) drug therapy: Secondary | ICD-10-CM | POA: Diagnosis not present

## 2017-03-11 DIAGNOSIS — B9620 Unspecified Escherichia coli [E. coli] as the cause of diseases classified elsewhere: Secondary | ICD-10-CM | POA: Insufficient documentation

## 2017-03-11 DIAGNOSIS — R110 Nausea: Secondary | ICD-10-CM | POA: Diagnosis not present

## 2017-03-11 DIAGNOSIS — J9601 Acute respiratory failure with hypoxia: Secondary | ICD-10-CM | POA: Diagnosis not present

## 2017-03-11 DIAGNOSIS — N201 Calculus of ureter: Secondary | ICD-10-CM | POA: Diagnosis not present

## 2017-03-11 DIAGNOSIS — I340 Nonrheumatic mitral (valve) insufficiency: Secondary | ICD-10-CM | POA: Diagnosis not present

## 2017-03-11 DIAGNOSIS — Z8544 Personal history of malignant neoplasm of other female genital organs: Secondary | ICD-10-CM | POA: Insufficient documentation

## 2017-03-11 DIAGNOSIS — I482 Chronic atrial fibrillation: Secondary | ICD-10-CM | POA: Diagnosis not present

## 2017-03-11 DIAGNOSIS — A419 Sepsis, unspecified organism: Secondary | ICD-10-CM | POA: Diagnosis not present

## 2017-03-11 DIAGNOSIS — K219 Gastro-esophageal reflux disease without esophagitis: Secondary | ICD-10-CM | POA: Insufficient documentation

## 2017-03-11 DIAGNOSIS — J440 Chronic obstructive pulmonary disease with acute lower respiratory infection: Secondary | ICD-10-CM

## 2017-03-11 DIAGNOSIS — G4733 Obstructive sleep apnea (adult) (pediatric): Secondary | ICD-10-CM | POA: Insufficient documentation

## 2017-03-11 DIAGNOSIS — I110 Hypertensive heart disease with heart failure: Secondary | ICD-10-CM | POA: Diagnosis not present

## 2017-03-11 DIAGNOSIS — R55 Syncope and collapse: Secondary | ICD-10-CM | POA: Diagnosis not present

## 2017-03-11 DIAGNOSIS — Z87891 Personal history of nicotine dependence: Secondary | ICD-10-CM | POA: Diagnosis not present

## 2017-03-11 LAB — COMPREHENSIVE METABOLIC PANEL
ALBUMIN: 3.9 g/dL (ref 3.5–5.0)
ALT: 16 U/L (ref 14–54)
AST: 22 U/L (ref 15–41)
Alkaline Phosphatase: 75 U/L (ref 38–126)
Anion gap: 9 (ref 5–15)
BUN: 17 mg/dL (ref 6–20)
CHLORIDE: 103 mmol/L (ref 101–111)
CO2: 27 mmol/L (ref 22–32)
CREATININE: 0.81 mg/dL (ref 0.44–1.00)
Calcium: 9.2 mg/dL (ref 8.9–10.3)
GFR calc non Af Amer: >60 mL/min (ref >60)
GLUCOSE: 188 mg/dL — AB (ref 65–99)
Potassium: 3.1 mmol/L — ABNORMAL LOW (ref 3.5–5.1)
SODIUM: 139 mmol/L (ref 135–145)
Total Bilirubin: 0.9 mg/dL (ref 0.3–1.2)
Total Protein: 8.5 g/dL — ABNORMAL HIGH (ref 6.5–8.1)

## 2017-03-11 LAB — URINALYSIS, COMPLETE (UACMP) WITH MICROSCOPIC
BILIRUBIN URINE: NEGATIVE
Glucose, UA: NEGATIVE mg/dL
KETONES UR: NEGATIVE mg/dL
Nitrite: NEGATIVE
Protein, ur: 100 mg/dL — AB
SPECIFIC GRAVITY, URINE: 1.015 (ref 1.005–1.030)
pH: 7 (ref 5.0–8.0)

## 2017-03-11 LAB — CBC
HCT: 40.4 % (ref 35.0–47.0)
Hemoglobin: 13.3 g/dL (ref 12.0–16.0)
MCH: 30 pg (ref 26.0–34.0)
MCHC: 32.9 g/dL (ref 32.0–36.0)
MCV: 91.4 fL (ref 80.0–100.0)
PLATELETS: 214 10*3/uL (ref 150–440)
RBC: 4.42 MIL/uL (ref 3.80–5.20)
RDW: 14 % (ref 11.5–14.5)
WBC: 8 10*3/uL (ref 3.6–11.0)

## 2017-03-11 LAB — LIPASE, BLOOD: LIPASE: 34 U/L (ref 11–51)

## 2017-03-11 MED ORDER — CEFTRIAXONE SODIUM IN DEXTROSE 20 MG/ML IV SOLN
1.0000 g | Freq: Once | INTRAVENOUS | Status: AC
  Administered 2017-03-11: 1 g via INTRAVENOUS
  Filled 2017-03-11: qty 50

## 2017-03-11 MED ORDER — SODIUM CHLORIDE 0.9 % IV BOLUS (SEPSIS)
1000.0000 mL | Freq: Once | INTRAVENOUS | Status: AC
  Administered 2017-03-11: 1000 mL via INTRAVENOUS

## 2017-03-11 MED ORDER — CEFTRIAXONE SODIUM IN DEXTROSE 20 MG/ML IV SOLN
1.0000 g | INTRAVENOUS | Status: DC
  Filled 2017-03-11: qty 50

## 2017-03-11 MED ORDER — DIAZEPAM 5 MG/ML IJ SOLN: 5.0000 mg | Freq: Once | INTRAMUSCULAR | Status: DC

## 2017-03-11 MED ORDER — POTASSIUM CHLORIDE 20 MEQ PO PACK
PACK | ORAL | Status: AC
  Administered 2017-03-11: 40 meq via ORAL
  Filled 2017-03-11: qty 2

## 2017-03-11 MED ORDER — POTASSIUM CHLORIDE 20 MEQ PO PACK
40.0000 meq | PACK | Freq: Once | ORAL | Status: AC
  Administered 2017-03-11: 40 meq via ORAL

## 2017-03-11 MED ORDER — MECLIZINE HCL 25 MG PO TABS
50.0000 mg | ORAL_TABLET | Freq: Once | ORAL | Status: AC
  Administered 2017-03-11: 50 mg via ORAL
  Filled 2017-03-11: qty 2

## 2017-03-11 MED ORDER — HYDRALAZINE HCL 20 MG/ML IJ SOLN
10.0000 mg | INTRAMUSCULAR | Status: DC | PRN
  Administered 2017-03-11: 10 mg via INTRAVENOUS
  Filled 2017-03-11: qty 1

## 2017-03-11 MED ORDER — LORAZEPAM 2 MG/ML IJ SOLN
1.0000 mg | Freq: Once | INTRAMUSCULAR | Status: AC
  Administered 2017-03-11: 1 mg via INTRAVENOUS
  Filled 2017-03-11: qty 1

## 2017-03-11 NOTE — ED Notes (Addendum)
Pt stating that she began with severe dizziness around noon. Pt stating that she is still dizzy with her eyes closed but that it is better. Pt stating that she does have some "stuffiness" in her ears, worse on the right. Pt denying any fevers. Pt stating that she began with n/v soon after the dizziness. Pt last emesis was in the parking lot of the ED. Pt's daughter stating that she it took her a hour to get her up and to the car because of her nausea. Pt is sweaty but denying any pain at this time. Pt has hx of a-fib and CHF. Pt stating that she had a few palpitations today. VS stable on the monitor.

## 2017-03-11 NOTE — ED Notes (Signed)
Nurse called pharmacy about dose of Valium. Valium is supposed to be being sent at this time.

## 2017-03-11 NOTE — H&P (Signed)
[facility] [place] NAME: Jeanetta Alonzo    MR#:  [id number]  DATE OF BIRTH:  [birth year]  DATE OF ADMISSION:  03/11/2017  PRIMARY CARE PHYSICIAN: Juline Patch, MD   REQUESTING/REFERRING PHYSICIAN:   CHIEF COMPLAINT:   Chief Complaint  Patient presents with  . Dizziness  . Emesis    HISTORY OF PRESENT ILLNESS: Gitel Beste  is a 71 y.o. female with a known history per below, presenting with acute dizziness/vertigo started around noon today associated with nausea and emesis, in the emergency room patient found to have potassium 3.1, urinalysis suspicious for UTI, MRI brain done noted for old left basal ganglia infarct, patient resting comfortably in bed, family at the bedside, patient is now being referred to the observation unit for acute vertigo, probable UTI and hypokalemia.  PAST MEDICAL HISTORY:   Past Medical History:  Diagnosis Date  . A-fib ([place])   . Abnormal Pap smear of vagina and vaginal HPV   . Arthritis    "bones ache" (01/23/2015)  . Cancer ([place])    vulvar cancer  . CHF (congestive heart failure) ([place])   . Chronic anticoagulation    She is on Xarelto for Afib  . Chronic atrial fibrillation ([place])   . Dyspnea   . Echocardiogram abnormal    Mitral regurgitation, tricuspic regurgitation  . Family history of adverse reaction to anesthesia    sister gets very nauseated and vomits  . GERD (gastroesophageal reflux disease)   . History of kidney stones   . History of stress test    Myoview 8/16: EF 42%, anteroseptal, inferoseptal, anterior, apical anterior and apical defect (likely represents breast attenuation versus scar), no ischemia; intermediate risk because of low EF  . HPV (human papilloma virus) anogenital infection    03/21/12 PAP + HR HPV  . Hypertension   . Insulin resistance   . Kidney stone   . Kidney stones   . Obesity   . OSA on CPAP   . Postoperative nausea and vomiting 1983   many years ago with  hysterectomy  . Urolithiasis   . Vulvar cancer ([place]) 2016   invasive verrucous carcinoma of the vulva. History of genital warts and vulvar condyloma    PAST SURGICAL HISTORY:  Past Surgical History:  Procedure Laterality Date  . ABDOMINAL HYSTERECTOMY    . APPENDECTOMY    . BILATERAL SALPINGOOPHORECTOMY  2003   benign ovarian cancer  . CARDIOVERSION  2014-2015   "[place]"  . COLONOSCOPY WITH PROPOFOL N/A 06/10/2015   Procedure: COLONOSCOPY WITH PROPOFOL;  Surgeon: Lucilla Lame, MD;  Location: [facility];  Service: Endoscopy;  Laterality: N/A;  . CYSTOSCOPY W/ RETROGRADES Bilateral 10/19/2016   Procedure: CYSTOSCOPY WITH RETROGRADE PYELOGRAM;  Surgeon: Hollice Espy, MD;  Location: [facility] ORS;  Service: Urology;  Laterality: Bilateral;  . CYSTOSCOPY W/ URETERAL STENT PLACEMENT Left 12/29/2016   Procedure: CYSTOSCOPY WITH STENT REPLACEMENT;  Surgeon: Hollice Espy, MD;  Location: [facility] ORS;  Service: Urology;  Laterality: Left;  . CYSTOSCOPY WITH BIOPSY N/A 10/19/2016   Procedure: CYSTOSCOPY WITH BLADDER BIOPSY, VAGINAL WALL BIOPSY;  Surgeon: Hollice Espy, MD;  Location: [facility] ORS;  Service: Urology;  Laterality: N/A;  . CYSTOSCOPY WITH STENT PLACEMENT Left 12/14/2016   Procedure: CYSTOSCOPY WITH STENT PLACEMENT;  Surgeon: Hollice Espy, MD;  Location: [facility] OR[facility];  Service: Urology;  Laterality: Left;  . ESOPHAGOGASTRODUODENOSCOPY (EGD) WITH PROPOFOL N/A 10/05/2016   Surgeon: Lucilla Lame, MD;  Location: [facility] END[facility];  Results: Barrett's Esophagus- repeat in 3 years 09/2019  . LITHOTRIPSY    . STONE EXTRACTION WITH BASKET Left 12/29/2016   Procedure: STONE EXTRACTION WITH BASKET;  Surgeon: Hollice Espy, MD;  Location: [facility] ORS;  Service: Urology;  Laterality: Left;  . TONSILLECTOMY    . URETEROSCOPY Left 12/29/2016   Procedure: URETEROSCOPY;  Surgeon: Hollice Espy, MD;  Location: [facility] OR[facility];  Service: Urology;  Laterality: Left;  [facility] VULVECTOMY Right 05/10/2014    Excisional biopsy of the superior right labial majora mass; [place] PARTIAL  07/02/2014   Re-excision and sentinel node dissection at [facility].     SOCIAL HISTORY:  Social History   Tobacco Use  . Smoking status: Former Smoker    Packs/day: 1.00    Years: 50.00    Pack years: 50.00    Types: Cigarettes, E-cigarettes    Last attempt to quit: 05/20/2012    Years since quitting: 4.8  . Smokeless tobacco: Never Used  Substance Use Topics  . Alcohol use: No    Alcohol/week: 0.0 oz    Comment: 01/23/2015 "I' used to drink socially a few times/month"    FAMILY HISTORY:  Family History  Problem Relation Age of Onset  . Prostate cancer Brother 30       still living and well  . Heart attack Father   . Stroke Mother   . Diabetes Mother   . Hypertension Mother   . Heart attack Brother   . Pancreatic cancer Brother   . Healthy Sister   . Breast cancer Neg Hx   . Kidney cancer Neg Hx   . Bladder Cancer Neg Hx     DRUG ALLERGIES:  Allergies  Allergen Reactions  . Ciprofloxacin Other (See Comments)    SOB    REVIEW OF SYSTEMS:   CONSTITUTIONAL: No fever,+ fatigue/weakness.  EYES: No blurred or double vision.  EARS, NOSE, AND THROAT: No tinnitus or ear pain.  RESPIRATORY: No cough, shortness of breath, wheezing or hemoptysis.  CARDIOVASCULAR: No chest pain, orthopnea, edema.  GASTROINTESTINAL: + nausea/vomiting, no diarrhea or abdominal pain.  GENITOURINARY: No dysuria, hematuria.  ENDOCRINE: No polyuria, nocturia,  HEMATOLOGY: No anemia, easy bruising or bleeding SKIN: No rash or lesion. MUSCULOSKELETAL: No joint pain or arthritis.   NEUROLOGIC: No tingling, numbness, weakness.  Vertigo/dizziness PSYCHIATRY: No anxiety or depression.   MEDICATIONS AT HOME:  Prior to Admission medications   Medication Sig Start Date End Date Taking? Authorizing Provider  acetaminophen (TYLENOL) 325 MG tablet Take 2 tablets (650 mg total) by mouth every 6 (six) hours  as needed for mild pain (or Fever >/= 101). 12/16/16   Gouru, Illene Silver, MD  albuterol (PROVENTIL HFA;VENTOLIN HFA) 108 (90 Base) MCG/ACT inhaler Inhale 2 puffs into the lungs every 8 (eight) hours as needed for wheezing or shortness of breath. 04/29/16   Juline Patch, MD  furosemide (LASIX) 40 MG tablet Take 80 mg by mouth daily.    [provider]  irbesartan (AVAPRO) 75 MG tablet Take 37.5 mg by mouth daily.    [provider]  metoprolol tartrate (LOPRESSOR) 100 MG tablet Take 150 mg by mouth 2 (two) times daily.    [provider]  omeprazole (PRILOSEC) 20 MG capsule Take 20 mg by mouth daily.    [provider]  rivaroxaban (XARELTO) 20 MG TABS tablet Take 20 mg by mouth daily with supper.    [provider]  sertraline (ZOLOFT) 25 MG tablet Take 25 mg by  mouth daily.    [provider]  sertraline (ZOLOFT) 25 MG tablet TAKE 1 TABLET BY MOUTH EVERY DAY 02/15/17   Juline Patch, MD  triamcinolone (NASACORT ALLERGY 24HR) 55 MCG/ACT AERO nasal inhaler Place 2 sprays into the nose daily as needed.     [provider]  vitamin B-12 (CYANOCOBALAMIN) 1000 MCG tablet Take 1,000 mcg by mouth every other day.     [provider]      PHYSICAL EXAMINATION:   VITAL SIGNS: Blood pressure (!) 145/100, pulse 78, temperature (!) 97.4 F (36.3 C), temperature source Oral, resp. rate 18, height 5' (1.524 m), weight 98 kg (216 lb), SpO2 98 %.  GENERAL:  71 y.o.-year-old patient lying in the bed with no acute distress.  Morbidly obese, nontoxic-appearing EYES: Pupils equal, round, reactive to light and accommodation. No scleral icterus. Extraocular muscles intact.  HEENT: Head atraumatic, normocephalic. Oropharynx and nasopharynx clear.  NECK:  Supple, no jugular venous distention. No thyroid enlargement, no tenderness.  LUNGS: Normal breath sounds bilaterally, no wheezing, rales,rhonchi or crepitation. No use of accessory muscles of  respiration.  CARDIOVASCULAR: S1, S2 normal. No murmurs, rubs, or gallops.  ABDOMEN: Soft, nontender, nondistended. Bowel sounds present. No organomegaly or mass.  EXTREMITIES: No pedal edema, cyanosis, or clubbing.  NEUROLOGIC: Cranial nerves II through XII are intact. Muscle strength 5/5 in all extremities. Gait not checked.  PSYCHIATRIC: The patient is alert and oriented x 3.  SKIN: No obvious rash, lesion, or ulcer.   LABORATORY PANEL:   CBC Recent Labs  Lab 03/11/17 1722  WBC 8.0  HGB 13.3  HCT 40.4  PLT 214  MCV 91.4  MCH 30.0  MCHC 32.9  RDW 14.0   ------------------------------------------------------------------------------------------------------------------  Chemistries  Recent Labs  Lab 03/11/17 1722  NA 139  K 3.1*  CL 103  CO2 27  GLUCOSE 188*  BUN 17  CREATININE 0.81  CALCIUM 9.2  AST 22  ALT 16  ALKPHOS 75  BILITOT 0.9   ------------------------------------------------------------------------------------------------------------------ estimated creatinine clearance is 64.9 mL/min (by C-G formula based on SCr of 0.81 mg/dL). ------------------------------------------------------------------------------------------------------------------ No results for input(s): TSH, T4TOTAL, T3FREE, THYROIDAB in the last 72 hours.  Invalid input(s): FREET3   Coagulation profile No results for input(s): INR, PROTIME in the last 168 hours. ------------------------------------------------------------------------------------------------------------------- No results for input(s): DDIMER in the last 72 hours. -------------------------------------------------------------------------------------------------------------------  Cardiac Enzymes No results for input(s): CKMB, TROPONINI, MYOGLOBIN in the last 168 hours.  Invalid input(s): CK ------------------------------------------------------------------------------------------------------------------ Invalid  input(s): POCBNP  ---------------------------------------------------------------------------------------------------------------  Urinalysis    Component Value Date/Time   COLORURINE YELLOW (A) 03/11/2017 1956   APPEARANCEUR HAZY (A) 03/11/2017 1956   APPEARANCEUR Cloudy (A) 12/22/2016 1158   LABSPEC 1.015 03/11/2017 1956   PHURINE 7.0 03/11/2017 1956   GLUCOSEU NEGATIVE 03/11/2017 1956   HGBUR MODERATE (A) 03/11/2017 1956   BILIRUBINUR NEGATIVE 03/11/2017 1956   BILIRUBINUR Negative 12/22/2016 1158   KETONESUR NEGATIVE 03/11/2017 1956   PROTEINUR 100 (A) 03/11/2017 1956   UROBILINOGEN 1.0 01/28/2015 2058   NITRITE NEGATIVE 03/11/2017 1956   LEUKOCYTESUR MODERATE (A) 03/11/2017 1956   LEUKOCYTESUR 3+ (A) 12/22/2016 1158     RADIOLOGY: Ct Head Wo Contrast  Result Date: 03/11/2017 CLINICAL DATA:  Dizziness, nausea long, diarrhea started today. EXAM: CT HEAD WITHOUT CONTRAST TECHNIQUE: Contiguous axial images were obtained from the base of the skull through the vertex without intravenous contrast. COMPARISON:  None. FINDINGS: Brain: No evidence of acute infarction, hemorrhage, extra-axial collection, ventriculomegaly, or mass effect. Generalized cerebral  atrophy. Periventricular white matter low attenuation likely secondary to microangiopathy. Vascular: Cerebrovascular atherosclerotic calcifications are noted. Skull: Negative for fracture or focal lesion. Sinuses/Orbits: Visualized portions of the orbits are unremarkable. Visualized portions of the paranasal sinuses and mastoid air cells are unremarkable. Other: None. IMPRESSION: No acute intracranial pathology. Electronically Signed   By: Kathreen Devoid   On: 03/11/2017 18:24   Mr Brain Wo Contrast  Result Date: 03/11/2017 CLINICAL DATA:  Central vertigo and diarrhea EXAM: MRI HEAD WITHOUT CONTRAST TECHNIQUE: Multiplanar, multiecho pulse sequences of the brain and surrounding structures were obtained without intravenous contrast.  COMPARISON:  Head CT 03/11/2017 FINDINGS: Brain: The midline structures are normal. There is no acute infarct or acute hemorrhage. No mass lesion, hydrocephalus, dural abnormality or extra-axial collection. There is multifocal white matter hyperintensity suggesting chronic ischemic microangiopathy. Old left basal ganglia lacunar infarct. Generalized atrophy without lobar predilection. No chronic microhemorrhage or superficial siderosis. Vascular: Major intracranial arterial and venous sinus flow voids are preserved. Skull and upper cervical spine: The visualized skull base, calvarium, upper cervical spine and extracranial soft tissues are normal. Sinuses/Orbits: No fluid levels or advanced mucosal thickening. No mastoid or middle ear effusion. Normal orbits. IMPRESSION: 1. No acute intracranial abnormality or specific finding to explain the reported dizziness. 2. Mild sequelae of chronic ischemic microangiopathy. Old left basal ganglia lacunar infarct. Electronically Signed   By: Ulyses Jarred M.D.   On: 03/11/2017 22:04    EKG: Orders placed or performed during the hospital encounter of 12/13/16  . ED EKG  . ED EKG    IMPRESSION AND PLAN: 1 acute vertigo ?  Secondary to UTI, MRI of the brain negative for acute cerebrovascular accident Referred to the observation unit, gentle IV fluids for rehydration given history of heart failure, meclizine as needed, fall precautions, and continue close medical monitoring  2 acute hypokalemia Replete with IV fluids/p.o. potassium, check magnesium level and BMP in the morning  3 acute probable urinary tract infection Rocephin IV and follow-up on cultures  4 chronic atrial fibrillation Stable Continue Lopressor, Xarelto  Full code Condition stable Prognosis fair DVT prophylaxis-on Xarelto Disposition Home in 1-2 days barring any complications  All the records are reviewed and case discussed with ED provider. Management plans discussed with the  patient, family and they are in agreement.  CODE STATUS: Code Status History    Date Active Date Inactive Code Status Order ID Comments User Context   12/13/2016 21:02 12/16/2016 18:58 Full Code [id number]  Hillary Bow, MD ED   01/28/2015 21:36 01/31/2015 17:05 Full Code [id number]  Reyne Dumas, MD ED   01/21/2015 18:51 01/24/2015 18:07 Full Code [id number]  Leanor Kail, PA Inpatient       TOTAL TIME TAKING CARE OF THIS PATIENT: 45 minutes.    Avel Peace Salary M.D on 03/11/2017   Between 7am to 6pm - Pager - [phone]  After 6pm go to [email or web address] - password [email or web address] [facility]  Office  [phone]  CC: Primary care physician; Juline Patch, MD   Note: This dictation was prepared with Dragon dictation along with smaller phrase technology. Any transcriptional errors that result from this process are unintentional.

## 2017-03-11 NOTE — ED Notes (Signed)
Dr. Kerman Passey stating that it was okay to stop fluids at half a liter.

## 2017-03-11 NOTE — ED Notes (Signed)
[facility] MRI phone given to pt

## 2017-03-11 NOTE — ED Provider Notes (Signed)
[facility] Emergency Department Provider Note  Time seen: 5:55 PM  I have reviewed the triage vital signs and the nursing notes.   HISTORY  Chief Complaint Dizziness and Emesis    HPI Holly Smith is a 71 y.o. female with a past medical history of atrial fibrillation on Xarelto, CHF, hypertension, presents to the emergency department with dizziness.  According to the patient beginning this morning she has felt extremely dizzy which she describes as a room spinning sensation with nausea and frequent vomiting.  Patient also states she has noted some upper abdominal pain which started after vomiting.  On review of systems the patient notes cloudy urine with a foul smell for the past several days but denies dysuria.  Denies fever, cough, congestion, chest pain, diarrhea.  Patient continues to state significant dizziness worse with movement, better when she shuts her eyes.  Past Medical History:  Diagnosis Date  . A-fib ([place])   . Abnormal Pap smear of vagina and vaginal HPV   . Arthritis    "bones ache" (01/23/2015)  . Cancer ([place])    vulvar cancer  . CHF (congestive heart failure) ([place])   . Chronic anticoagulation    She is on Xarelto for Afib  . Chronic atrial fibrillation ([place])   . Dyspnea   . Echocardiogram abnormal    Mitral regurgitation, tricuspic regurgitation  . Family history of adverse reaction to anesthesia    sister gets very nauseated and vomits  . GERD (gastroesophageal reflux disease)   . History of kidney stones   . History of stress test    Myoview 8/16: EF 42%, anteroseptal, inferoseptal, anterior, apical anterior and apical defect (likely represents breast attenuation versus scar), no ischemia; intermediate risk because of low EF  . HPV (human papilloma virus) anogenital infection    03/21/12 PAP + HR HPV  . Hypertension   . Insulin resistance   . Kidney stone   . Kidney stones   . Obesity   . OSA on CPAP   . Postoperative  nausea and vomiting 1983   many years ago with hysterectomy  . Urolithiasis   . Vulvar cancer ([place]) 2016   invasive verrucous carcinoma of the vulva. History of genital warts and vulvar condyloma    Patient Active Problem List   Diagnosis Date Noted  . Sepsis ([place]) 12/13/2016  . Dyspepsia   . Gastritis without bleeding   . Other specified diseases of esophagus   . History of alcohol use 09/07/2016  . Hypertensive heart disease 05/12/2016  . Atrial fibrillation, persistent ([place]) 08/13/2015  . COPD (chronic obstructive pulmonary disease) ([place]) 08/13/2015  . Personal history of other specified conditions 08/13/2015  . Insulin resistance 08/13/2015  . Obesity, unspecified 08/13/2015  . Special screening for malignant neoplasms, colon   . Benign neoplasm of ascending colon   . Benign neoplasm of descending colon   . Benign neoplasm of sigmoid colon   . Chronic diastolic heart failure ([place]) 05/05/2015  . Diverticulitis 01/31/2015  . Diverticulitis large intestine w/o perforation or abscess w/bleeding 01/28/2015  . Chronic combined systolic and diastolic CHF, NYHA class 3 ([place]) 01/28/2015  . Acute diverticulitis 01/28/2015  . Diverticulitis of large intestine without perforation or abscess with bleeding   . Acute on chronic diastolic heart failure ([employer]) 01/21/2015  . Hypertensive urgency 01/21/2015  . Morbid obesity ([place]) 01/21/2015  . Blood in stool 01/21/2015  . Acute diastolic CHF (congestive heart failure) ([place]) 01/21/2015  . CAFL (chronic airflow  limitation) ([place]) 12/16/2014  . Personal history of perinatal problems 12/16/2014  . HTN (hypertension) 12/16/2014  . Hyperlipidemia, unspecified 12/16/2014  . Dysmetabolic syndrome 50/93/2671  . Kidney stones 12/16/2014  . PVC (premature ventricular contraction) 12/16/2014  . Snoring 12/16/2014  . Stress incontinence 12/16/2014  . Fast heart beat 12/16/2014  . Calculus (=stone) 12/16/2014  . OSA (obstructive sleep apnea)  11/27/2014  . Dyspnea 10/07/2014  . Essential hypertension 10/07/2014  . Obesity 10/07/2014  . Genital warts 08/02/2014  . Condyloma acuminatum of vulva 08/02/2014  . Condylomata lata of vulva 08/02/2014  . Anticoagulation adequate 08/02/2014  . Chronic atrial fibrillation ([place]) 08/02/2014  . Current tobacco use 08/02/2014  . 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 Malignant neoplasm of vulva ([place]) 06/19/2014  . Vulvar cancer ([place]) 05/10/2014  . Vulvar cancer, carcinoma ([place]) 05/10/2014  . 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 Chest pain 08/20/2013  . Breath shortness 08/20/2013  . CCF (congestive cardiac failure) ([place]) 08/03/2013  . CHF (congestive heart failure) ([place]) 08/03/2013  . H/O transesophageal echocardiography (TEE) for monitoring 06/21/2013  . Echocardiogram abnormal 04/23/2013  . Mitral regurgitation 04/23/2013  . TI (tricuspid incompetence) 04/23/2013    Past Surgical History:  Procedure Laterality Date  . ABDOMINAL HYSTERECTOMY    . APPENDECTOMY    . BILATERAL SALPINGOOPHORECTOMY  2003   benign ovarian cancer  . CARDIOVERSION  2014-2015   "[place]"  . COLONOSCOPY WITH PROPOFOL N/A 06/10/2015   Procedure: COLONOSCOPY WITH PROPOFOL;  Surgeon: Lucilla Lame, MD;  Location: [facility] ENDOSCOPY;  Service: Endoscopy;  Laterality: N/A;  . CYSTOSCOPY W/ RETROGRADES Bilateral 10/19/2016   Procedure: CYSTOSCOPY WITH RETROGRADE PYELOGRAM;  Surgeon: Hollice Espy, MD;  Location: [facility] ORS;  Service: Urology;  Laterality: Bilateral;  . CYSTOSCOPY W/ URETERAL STENT PLACEMENT Left 12/29/2016   Procedure: CYSTOSCOPY WITH STENT REPLACEMENT;  Surgeon: Hollice Espy, MD;  Location: [facility] ORS;  Service: Urology;  Laterality: Left;  . CYSTOSCOPY WITH BIOPSY N/A 10/19/2016   Procedure: CYSTOSCOPY WITH BLADDER BIOPSY, VAGINAL WALL BIOPSY;  Surgeon: Hollice Espy, MD;  Location: [facility] ORS;  Service: Urology;  Laterality: N/A;  . CYSTOSCOPY WITH STENT PLACEMENT  Left 12/14/2016   Procedure: CYSTOSCOPY WITH STENT PLACEMENT;  Surgeon: Hollice Espy, MD;  Location: [facility] ORS;  Service: Urology;  Laterality: Left;  . ESOPHAGOGASTRODUODENOSCOPY (EGD) WITH PROPOFOL N/A 10/05/2016   Surgeon: Lucilla Lame, MD;  Location: [facility] ENDOSCOPY;  Results: Barrett's Esophagus- repeat in 3 years 09/2019  . LITHOTRIPSY    . STONE EXTRACTION WITH BASKET Left 12/29/2016   Procedure: STONE EXTRACTION WITH BASKET;  Surgeon: Hollice Espy, MD;  Location: [facility] ORS;  Service: Urology;  Laterality: Left;  . TONSILLECTOMY    . URETEROSCOPY Left 12/29/2016   Procedure: URETEROSCOPY;  Surgeon: Hollice Espy, MD;  Location: [facility] ORS;  Service: Urology;  Laterality: Left;  Marland Kitchen VULVECTOMY Right 05/10/2014   Excisional biopsy of the superior right labial majora mass; [place] PARTIAL  07/02/2014   Re-excision and sentinel node dissection at [facility].     Prior to Admission medications   Medication Sig Start Date End Date Taking? Authorizing Provider  acetaminophen (TYLENOL) 325 MG tablet Take 2 tablets (650 mg total) by mouth every 6 (six) hours as needed for mild pain (or Fever >/= 101). 12/16/16   Gouru, Illene Silver, MD  albuterol (PROVENTIL HFA;VENTOLIN HFA) 108 (90 Base) MCG/ACT inhaler Inhale 2 puffs into the lungs every 8 (eight) hours as needed for wheezing or shortness of  breath. 04/29/16   Juline Patch, MD  furosemide (LASIX) 40 MG tablet Take 80 mg by mouth daily.    [provider]  irbesartan (AVAPRO) 75 MG tablet Take 37.5 mg by mouth daily.    [provider]  metoprolol tartrate (LOPRESSOR) 100 MG tablet Take 150 mg by mouth 2 (two) times daily.    [provider]  omeprazole (PRILOSEC) 20 MG capsule Take 20 mg by mouth daily.    [provider]  rivaroxaban (XARELTO) 20 MG TABS tablet Take 20 mg by mouth daily with supper.    [provider]  sertraline (ZOLOFT) 25 MG tablet Take 25 mg by mouth daily.     [provider]  sertraline (ZOLOFT) 25 MG tablet TAKE 1 TABLET BY MOUTH EVERY DAY 02/15/17   Juline Patch, MD  triamcinolone (NASACORT ALLERGY 24HR) 55 MCG/ACT AERO nasal inhaler Place 2 sprays into the nose daily as needed.     [provider]  vitamin B-12 (CYANOCOBALAMIN) 1000 MCG tablet Take 1,000 mcg by mouth every other day.     [provider]    Allergies  Allergen Reactions  . Ciprofloxacin Other (See Comments)    SOB    Family History  Problem Relation Age of Onset  . Prostate cancer Brother 40       still living and well  . Heart attack Father   . Stroke Mother   . Diabetes Mother   . Hypertension Mother   . Heart attack Brother   . Pancreatic cancer Brother   . Healthy Sister   . Breast cancer Neg Hx   . Kidney cancer Neg Hx   . Bladder Cancer Neg Hx     Social History Social History   Tobacco Use  . Smoking status: Former Smoker    Packs/day: 1.00    Years: 50.00    Pack years: 50.00    Types: Cigarettes, E-cigarettes    Last attempt to quit: 05/20/2012    Years since quitting: 4.8  . Smokeless tobacco: Never Used  Substance Use Topics  . Alcohol use: No    Alcohol/week: 0.0 oz    Comment: 01/23/2015 "I' used to drink socially a few times/month"  . Drug use: No    Review of Systems Constitutional: Negative for fever Cardiovascular: Negative for chest pain. Respiratory: Negative for shortness of breath. Gastrointestinal: Mild upper abdominal pain.  Positive for nausea vomiting.  Negative for diarrhea. Genitourinary: Negative for dysuria.  Positive for cloudy and foul-smelling urine times 3 days. Neurological: Negative for headaches, focal weakness or numbness. All other ROS negative  ____________________________________________   PHYSICAL EXAM:  VITAL SIGNS: ED Triage Vitals  Enc Vitals Group     BP 03/11/17 1714 (!) 188/117     Pulse Rate 03/11/17 1714 84     Resp 03/11/17 1714 (!) 22     Temp 03/11/17 1714  (!) 97.4 F (36.3 C)     Temp Source 03/11/17 1714 Oral     SpO2 03/11/17 1714 91 %     Weight 03/11/17 1715 216 lb (98 kg)     Height 03/11/17 1715 5' (1.524 m)     Head Circumference --      Peak Flow --      Pain Score 03/11/17 1714 0     Pain Loc --      Pain Edu? --      Excl. in [place]? --    Constitutional: Alert and oriented.  Well appearing and in no distress. Eyes: Patient does appear to have a mild nystagmus during examination, states dizziness is worse with her eyes open. ENT   Head: Normocephalic and atraumatic.   Mouth/Throat: Mucous membranes are moist. Cardiovascular: Irregular rhythm, rate around 80 bpm.  No obvious murmur. Respiratory: Normal respiratory effort without tachypnea nor retractions. Breath sounds are clear  Gastrointestinal: Soft and nontender. No distention.   Musculoskeletal: Nontender with normal range of motion in all extremities. Neurologic:  Normal speech and language. No gross focal neurologic deficits.  Equal grip strength bilaterally.  No pronator drift.  Cranial nerves appear intact, with 5/5 strength in all extremities sensation intact and equal. Skin:  Skin is warm, dry and intact.  Psychiatric: Mood and affect are normal. Speech and behavior are normal.   ____________________________________________    EKG  EKG reviewed and interpreted by myself shows atrial fibrillation at 90 bpm with a narrow QRS, normal axis, slight QTC prolongation nonspecific ST changes without ST elevation.  ____________________________________________    RADIOLOGY  CT negative MRI negative  ____________________________________________   INITIAL IMPRESSION / ASSESSMENT AND PLAN / ED COURSE  Pertinent labs & imaging results that were available during my care of the patient were reviewed by me and considered in my medical decision making (see chart for details).  Patient presents to the emergency department with dizziness since this morning.   Differential would include vertigo, CVA, ICH, infectious etiology, electrolyte/metabolic abnormality.  We will check labs, IV hydrate, treat with IV Valium, obtain a CT scan of the head and continue to closely monitor.  Reassuringly the patient's neurological exam is intact/normal.  CT scan is negative.  Patient's labs show significant urinary tract infection.  Give the patient 1 g of IV Rocephin.  Patient has received Ativan for dizziness and nausea as well as IV fluids.  Continues to feel extremely dizzy with occasional episodes of nystagmus still seen on exam.  Given the patient's history of atrial fibrillation we will proceed with an MRI to rule out cerebellar infarction.  MRI has resulted negative, patient continues with dizziness in the emergency department, unable to keep her eyes open for prolonged periods or sit upright in bed for prolonged periods.  I suspect likely vertigo possibly contributing as well as her urinary tract infection.  Patient is receiving meclizine.  Given her continued dizziness she does not feel that she could safely go home.  I will discussed with the hospitalist for admission to the hospital for further treatment and evaluation.  Patient agreeable to this plan.  ____________________________________________   FINAL CLINICAL IMPRESSION(S) / ED DIAGNOSES  Dizziness Nausea vomiting Vertigo Urinary tract infection   Harvest Dark, MD 03/11/17 2257

## 2017-03-11 NOTE — ED Notes (Signed)
Patient transported to MRI 

## 2017-03-11 NOTE — ED Notes (Signed)
Pt in and out cathed with rachel hayden, rn for urine sample.

## 2017-03-11 NOTE — ED Notes (Signed)
[place], pharmacy, called, reports no IV valium in stock any longer

## 2017-03-11 NOTE — ED Notes (Signed)
Pt returned from MRI without ABX attached

## 2017-03-11 NOTE — ED Triage Notes (Signed)
Pt reports dizziness and N/V/D that started today. Multiple episodes of diarrhea and vomiting since 12pm today. Pt states she can't even open her eyes because she is so dizzy. Denies any pain.

## 2017-03-12 DIAGNOSIS — R55 Syncope and collapse: Secondary | ICD-10-CM | POA: Diagnosis not present

## 2017-03-12 DIAGNOSIS — R42 Dizziness and giddiness: Secondary | ICD-10-CM | POA: Diagnosis not present

## 2017-03-12 LAB — TROPONIN I
Troponin I: <0.03 ng/mL (ref <0.03)
Troponin I: <0.03 ng/mL (ref <0.03)
Troponin I: <0.03 ng/mL (ref <0.03)

## 2017-03-12 LAB — MAGNESIUM: Magnesium: 1.8 mg/dL (ref 1.7–2.4)

## 2017-03-12 MED ORDER — VITAMIN B-12 1000 MCG PO TABS
1000.0000 ug | ORAL_TABLET | ORAL | Status: DC
  Administered 2017-03-12: 09:00:00 1000 ug via ORAL
  Filled 2017-03-12: qty 1

## 2017-03-12 MED ORDER — MECLIZINE HCL 25 MG PO TABS
25.0000 mg | ORAL_TABLET | Freq: Three times a day (TID) | ORAL | Status: DC | PRN
  Filled 2017-03-12: qty 1

## 2017-03-12 MED ORDER — RIVAROXABAN 20 MG PO TABS
20.0000 mg | ORAL_TABLET | Freq: Every day | ORAL | Status: DC
  Administered 2017-03-12: 20 mg via ORAL
  Filled 2017-03-12 (×2): qty 1

## 2017-03-12 MED ORDER — METOPROLOL TARTRATE 50 MG PO TABS
150.0000 mg | ORAL_TABLET | Freq: Two times a day (BID) | ORAL | Status: DC
  Administered 2017-03-12 – 2017-03-13 (×3): 150 mg via ORAL
  Filled 2017-03-12 (×3): qty 3

## 2017-03-12 MED ORDER — TEMAZEPAM 15 MG PO CAPS: 15.0000 mg | ORAL_CAPSULE | Freq: Every evening | ORAL | Status: DC | PRN

## 2017-03-12 MED ORDER — SERTRALINE HCL 50 MG PO TABS
25.0000 mg | ORAL_TABLET | Freq: Every day | ORAL | Status: DC
  Administered 2017-03-12 – 2017-03-13 (×2): 25 mg via ORAL
  Filled 2017-03-12 (×2): qty 1

## 2017-03-12 MED ORDER — TRIAMCINOLONE ACETONIDE 55 MCG/ACT NA AERO
2.0000 | INHALATION_SPRAY | Freq: Every day | NASAL | Status: DC
  Administered 2017-03-12 – 2017-03-13 (×2): 2 via NASAL
  Filled 2017-03-12: qty 21.6

## 2017-03-12 MED ORDER — PANTOPRAZOLE SODIUM 40 MG PO TBEC
40.0000 mg | DELAYED_RELEASE_TABLET | Freq: Every day | ORAL | Status: DC
  Administered 2017-03-12 – 2017-03-13 (×2): 40 mg via ORAL
  Filled 2017-03-12 (×2): qty 1

## 2017-03-12 MED ORDER — MECLIZINE HCL 25 MG PO TABS
25.0000 mg | ORAL_TABLET | Freq: Three times a day (TID) | ORAL | Status: DC
  Administered 2017-03-12 – 2017-03-13 (×4): 25 mg via ORAL
  Filled 2017-03-12 (×6): qty 1

## 2017-03-12 MED ORDER — FUROSEMIDE 40 MG PO TABS
80.0000 mg | ORAL_TABLET | Freq: Every day | ORAL | Status: DC
  Administered 2017-03-12 – 2017-03-13 (×2): 80 mg via ORAL
  Filled 2017-03-12 (×2): qty 2

## 2017-03-12 MED ORDER — CEFTRIAXONE SODIUM 1 G IJ SOLR
1.0000 g | INTRAMUSCULAR | Status: DC
  Administered 2017-03-12 – 2017-03-13 (×2): 1 g via INTRAVENOUS
  Filled 2017-03-12 (×3): qty 10

## 2017-03-12 MED ORDER — ORAL CARE MOUTH RINSE
15.0000 mL | Freq: Two times a day (BID) | OROMUCOSAL | Status: DC
  Administered 2017-03-12: 17:00:00 15 mL via OROMUCOSAL

## 2017-03-12 MED ORDER — IRBESARTAN 75 MG PO TABS
37.5000 mg | ORAL_TABLET | Freq: Every day | ORAL | Status: DC
  Administered 2017-03-12 – 2017-03-13 (×2): 37.5 mg via ORAL
  Filled 2017-03-12 (×3): qty 0.5

## 2017-03-12 MED ORDER — IRBESARTAN 75 MG PO TABS
37.5000 mg | ORAL_TABLET | Freq: Every day | ORAL | Status: DC
  Filled 2017-03-12: qty 0.5

## 2017-03-12 MED ORDER — POTASSIUM CHLORIDE IN NACL 40-0.9 MEQ/L-% IV SOLN
INTRAVENOUS | Status: DC
  Filled 2017-03-12: qty 1000

## 2017-03-12 MED ORDER — ALBUTEROL SULFATE (2.5 MG/3ML) 0.083% IN NEBU: 2.5000 mg | INHALATION_SOLUTION | Freq: Three times a day (TID) | RESPIRATORY_TRACT | Status: DC | PRN

## 2017-03-12 MED ORDER — ORAL CARE MOUTH RINSE
15.0000 mL | Freq: Two times a day (BID) | OROMUCOSAL | Status: DC
  Administered 2017-03-12 – 2017-03-13 (×5): 15 mL via OROMUCOSAL

## 2017-03-12 NOTE — Evaluation (Signed)
Physical Therapy Evaluation Patient Details Name: Holly Smith MRN: [id number] DOB: June 03, 1945 Today's Date: 03/12/2017   History of Present Illness  Holly Smith is a 71yo white female who comes to [facility] on 12/21 d/t acute onset vertigo, and multiple episodes of vomitting, and generalized inability to AMB. Pt reports episode started with generalized 'swimmy headedness' with headturns, then progressed to romm spinning sensation, eventually unable to tolerate eyes open. She later noted some blurriness in her vision that is endorsed as different than a visual acuity problem.  She endorses some itchiness in the ears for several weeks prior. She reports some auditory changes, without significnat ringing, but with intermittent 'wooshing', as well as popping similar to with changes in barometric presure. She denies any numbness or tingling anywhere. She denies any changes to her speech. At time of PT evaluation, she reports resolution of room spining sensation, but reports some continued visual impairment up until this monring, unable to dee the dialing numbers on her mobile phone (now resolved.) She denies any recent head cold or sinus infection; she denies any prior balance or mobility deficits at baseline. She denies and prior episode similar to this. Upon arrival K+: 3.1, UA questionable to UTI, and MRI showin gonlg basal ganglia infarct only. PMH: CHF, HTN, OSA on CPAP, COPD without home O2, and ureteral stent.   Clinical Impression   Pt admitted with above diagnosis. Pt currently with functional limitations due to the deficits listed below (see "PT Problem List"). At evaluation, pt is received semirecumbent in bed upon entry, no family/caregiver present. The pt is awake and agreeable to participate. No acute distress noted at this time. VSS during eval. Pt received on 2L O2, moved to room air for evaluation, with noted saturation of >89%, whereas pt is not on O2 at baseline at home.  Speech:  Unimpaired, baseline.  Balance: LOB with vertical and horizontal head turns,  Strength: Functional mobility demonstrates mild-moderate weakness, the pt now requiring heavy effort and additional time to perform bed mobility and transfers; ambulation deferred d/t stance instability. The patient performs these independently at baseline.   Fine motor coordination: Mild to moderate impairment as demonstrated with difficulty during sequential digital opposition; impairment bilat but greater on the RUE than the LUE.  Light Touch Sensation: intact, BUE  Dysmetria: Finger to nose is mildly impaired, requires additional time; finger to distal end-range target (+) dysmetria bilat  Dysdiadochokinesia: (+) mild loss of coordination with rapid alternating movements in BUE; when asked to increase speed of movement, limbs lose synchronicity.  Visual Perception: WNL  Eye Tracking: full ROM, smooth pursuits with intermittent scaddic interrupt;  (+) Gaze evoked nystagmus: left beating, ~2Hz  at end range right gaze and ~3.5Hz  at end range left gaze. No subjective vertigo.  VOR gain: postiive for increased nystagmus at eng-range gaze bilat as described above.   Pt demonstrating a cluster of mildly positive tests empirically associated with impaired vestibular and cerebellar function with mostly bilat S/Sx, except for nystagmus which remains left beating throughout. Etiology unclear at this time, but acute process remains, although with subjective improvement of nausea and visual disturbance. Pt continues to demonstrate poor tolerance to functional activity, changes in head position, and heavily impaired gross motor control in stance. PT recommending neuro input if attending is agreeable. Pt will benefit from skilled PT intervention to increase independence and safety with basic mobility in preparation for discharge to the venue listed below.      Follow Up Recommendations Home health PT(Vestibular PT-  [place] main if  transportation can be arranged)    Equipment Recommendations  Rolling walker with 5" wheels    Recommendations for Other Services       Precautions / Restrictions Precautions Precautions: Fall Restrictions Weight Bearing Restrictions: No      Mobility  Bed Mobility Overal bed mobility: Modified Independent             General bed mobility comments: additional effort required, nausea and dizziness increaes during movement   Transfers Overall transfer level: Needs assistance Equipment used: 1 person hand held assist Transfers: Sit to/from Stand;Stand Pivot Transfers Sit to Stand: Min guard Stand pivot transfers: Min assist       General transfer comment: min-moderate strength deficits, with moderate to heavy balance deficits  Ambulation/Gait Ambulation/Gait assistance: (not safe at this time)              Financial trader [employer] (Stroke Patients Only)       Balance Overall balance assessment: Needs assistance                           High level balance activites: Head turns(LOB with horzontal and vertical headturns with retrupulsion; no LOB with eyes closed x10sec)               Pertinent Vitals/Pain Pain Assessment: No/denies pain    Home Living Family/patient expects to be discharged to:: Private residence Living Arrangements: Alone Available Help at Discharge: Family(son and granddaughter live in [place]) Type of Home: House Home Access: Stairs to enter Entrance Stairs-Rails: Left Entrance Stairs-Number of Steps: 2 Home Layout: One level Home Equipment: None Additional Comments: No walker, cane, wc, hospital bed, oxygen    Prior Function Level of Independence: Independent         Comments: Independent community ambulator without assistive device. No falls. Independent with ADLs/IADLs. Drives     Hand Dominance   Dominant Hand: Right    Extremity/Trunk Assessment   Upper  Extremity Assessment Upper Extremity Assessment: Overall WFL for tasks assessed    Lower Extremity Assessment Lower Extremity Assessment: Overall WFL for tasks assessed       Communication   Communication: No difficulties  Cognition Arousal/Alertness: Awake/alert Behavior During Therapy: WFL for tasks assessed/performed Overall Cognitive Status: Difficult to assess Area of Impairment: Awareness;Orientation                 Orientation Level: Person;Place;Time;Situation         Awareness: (decreased awareness of motoric deficits)          General Comments      Exercises     Assessment/Plan    PT Assessment Patient needs continued PT services  PT Problem List Obesity;Decreased activity tolerance;Decreased balance;Decreased coordination       PT Treatment Interventions Gait training;Balance training;Neuromuscular re-education;Therapeutic activities    PT Goals (Current goals can be found in the Care Plan section)  Acute Rehab PT Goals Patient Stated Goal: return to baseline mobility PT Goal Formulation: With patient Time For Goal Achievement: 03/26/17 Potential to Achieve Goals: Good    Frequency 7X/week   Barriers to discharge Decreased caregiver support      Co-evaluation               AM-PAC PT "6 Clicks" Daily Activity  Outcome Measure Difficulty turning over in bed (including adjusting bedclothes, sheets and  blankets)?: A Little Difficulty moving from lying on back to sitting on the side of the bed? : A Little Difficulty sitting down on and standing up from a chair with arms (e.g., wheelchair, bedside commode, etc,.)?: A Little Help needed moving to and from a bed to chair (including a wheelchair)?: A Little Help needed walking in hospital room?: A Lot Help needed climbing 3-5 steps with a railing? : Total 6 Click Score: 15    End of Session Equipment Utilized During Treatment: Oxygen Activity Tolerance: Patient tolerated treatment  well;Treatment limited secondary to medical complications (Comment)(continued nausea, nystagmus, dizzines, and incooridnation) Patient left: in chair;with call bell/phone within reach;with chair alarm set Nurse Communication: Mobility status(nystagmus, and other neurological deficits) PT Visit Diagnosis: Unsteadiness on feet (R26.81);Other symptoms and signs involving the nervous system (R29.898);Dizziness and giddiness (R42)    Time: 1458-1530 PT Time Calculation (min) (ACUTE ONLY): 32 min   Charges:   PT Evaluation $PT Eval Moderate Complexity: 1 Mod     PT G Codes:   PT G-Codes **NOT FOR INPATIENT CLASS** Functional Assessment Tool Used: AM-PAC 6 Clicks Basic Mobility;Clinical judgement Functional Limitation: Changing and maintaining body position Changing and Maintaining Body Position Current Status (S4967): At least 40 percent but less than 60 percent impaired, limited or restricted Changing and Maintaining Body Position Goal Status (R9163): At least 1 percent but less than 20 percent impaired, limited or restricted    4:13 PM, 03/12/17 Etta Grandchild, PT, DPT Physical Therapist - [facility])  [phone] (mobile)   Buccola,Allan C 03/12/2017, 3:55 PM

## 2017-03-12 NOTE — Progress Notes (Signed)
[place] [facility] [place] NAME: Holly Smith    MR#:  [id number]  DATE OF BIRTH:  [birth year]  SUBJECTIVE:  CHIEF COMPLAINT:   Chief Complaint  Patient presents with  . Dizziness  . Emesis   Still has some dizziness.  On 4 l o2  REVIEW OF SYSTEMS:    Review of Systems  Constitutional: Positive for malaise/fatigue. Negative for chills and fever.  HENT: Negative for sore throat.   Eyes: Negative for blurred vision, double vision and pain.  Respiratory: Negative for cough, hemoptysis, shortness of breath and wheezing.   Cardiovascular: Negative for chest pain, palpitations, orthopnea and leg swelling.  Gastrointestinal: Negative for abdominal pain, constipation, diarrhea, heartburn, nausea and vomiting.  Genitourinary: Negative for dysuria and hematuria.  Musculoskeletal: Negative for back pain and joint pain.  Skin: Negative for rash.  Neurological: Positive for dizziness and weakness. Negative for sensory change, speech change, focal weakness and headaches.  Endo/Heme/Allergies: Does not bruise/bleed easily.  Psychiatric/Behavioral: Negative for depression. The patient is not nervous/anxious.     DRUG ALLERGIES:   Allergies  Allergen Reactions  . Ciprofloxacin Other (See Comments)    SOB    VITALS:  Blood pressure 130/81, pulse 62, temperature 98.9 F (37.2 C), temperature source Oral, resp. rate 18, height 5\' 1"  (1.549 m), weight 97.5 kg (215 lb), SpO2 98 %.  PHYSICAL EXAMINATION:   Physical Exam  GENERAL:  71 y.o.-year-old patient lying in the bed with no acute distress.  EYES: Pupils equal, round, reactive to light and accommodation. No scleral icterus. Extraocular muscles intact.  HEENT: Head atraumatic, normocephalic. Oropharynx and nasopharynx clear.  NECK:  Supple, no jugular venous distention. No thyroid enlargement, no tenderness.  LUNGS: Normal breath sounds bilaterally, no wheezing, rales, rhonchi. No use of accessory  muscles of respiration.  CARDIOVASCULAR: S1, S2 normal. No murmurs, rubs, or gallops.  ABDOMEN: Soft, nontender, nondistended. Bowel sounds present. No organomegaly or mass.  EXTREMITIES: No cyanosis, clubbing or edema b/l.    NEUROLOGIC: Cranial nerves II through XII are intact. No focal Motor or sensory deficits b/l.   PSYCHIATRIC: The patient is alert and oriented x 3.  SKIN: No obvious rash, lesion, or ulcer.   LABORATORY PANEL:   CBC Recent Labs  Lab 03/11/17 1722  WBC 8.0  HGB 13.3  HCT 40.4  PLT 214   ------------------------------------------------------------------------------------------------------------------ Chemistries  Recent Labs  Lab 03/11/17 1722 03/12/17 0138  NA 139  --   K 3.1*  --   CL 103  --   CO2 27  --   GLUCOSE 188*  --   BUN 17  --   CREATININE 0.81  --   CALCIUM 9.2  --   MG  --  1.8  AST 22  --   ALT 16  --   ALKPHOS 75  --   BILITOT 0.9  --    ------------------------------------------------------------------------------------------------------------------  Cardiac Enzymes Recent Labs  Lab 03/12/17 0551  TROPONINI <0.03   ------------------------------------------------------------------------------------------------------------------  RADIOLOGY:  Ct Head Wo Contrast  Result Date: 03/11/2017 CLINICAL DATA:  Dizziness, nausea long, diarrhea started today. EXAM: CT HEAD WITHOUT CONTRAST TECHNIQUE: Contiguous axial images were obtained from the base of the skull through the vertex without intravenous contrast. COMPARISON:  None. FINDINGS: Brain: No evidence of acute infarction, hemorrhage, extra-axial collection, ventriculomegaly, or mass effect. Generalized cerebral atrophy. Periventricular white matter low attenuation likely secondary to microangiopathy. Vascular: Cerebrovascular atherosclerotic calcifications are noted. Skull: Negative for fracture or focal  lesion. Sinuses/Orbits: Visualized portions of the orbits are unremarkable.  Visualized portions of the paranasal sinuses and mastoid air cells are unremarkable. Other: None. IMPRESSION: No acute intracranial pathology. Electronically Signed   By: Kathreen Devoid   On: 03/11/2017 18:24   Mr Brain Wo Contrast  Result Date: 03/11/2017 CLINICAL DATA:  Central vertigo and diarrhea EXAM: MRI HEAD WITHOUT CONTRAST TECHNIQUE: Multiplanar, multiecho pulse sequences of the brain and surrounding structures were obtained without intravenous contrast. COMPARISON:  Head CT 03/11/2017 FINDINGS: Brain: The midline structures are normal. There is no acute infarct or acute hemorrhage. No mass lesion, hydrocephalus, dural abnormality or extra-axial collection. There is multifocal white matter hyperintensity suggesting chronic ischemic microangiopathy. Old left basal ganglia lacunar infarct. Generalized atrophy without lobar predilection. No chronic microhemorrhage or superficial siderosis. Vascular: Major intracranial arterial and venous sinus flow voids are preserved. Skull and upper cervical spine: The visualized skull base, calvarium, upper cervical spine and extracranial soft tissues are normal. Sinuses/Orbits: No fluid levels or advanced mucosal thickening. No mastoid or middle ear effusion. Normal orbits. IMPRESSION: 1. No acute intracranial abnormality or specific finding to explain the reported dizziness. 2. Mild sequelae of chronic ischemic microangiopathy. Old left basal ganglia lacunar infarct. Electronically Signed   By: Ulyses Jarred M.D.   On: 03/11/2017 22:04     ASSESSMENT AND PLAN:   * UTI On IV abx Wait for cx  * vertigo MRI - No CVA/mass Meclizine scheduled  * Vomiting has resolved Start diet  * Afib continue home meds  All the records are reviewed and case discussed with Care Management/Social Worker Management plans discussed with the patient, family and they are in agreement.  CODE STATUS: FULL CODE  DVT Prophylaxis: SCDs  TOTAL TIME TAKING CARE OF THIS  PATIENT: 35 minutes.   POSSIBLE D/C IN 1-2 DAYS, DEPENDING ON CLINICAL CONDITION.  Leia Alf Leahna Hewson M.D on 03/12/2017 at 1:41 PM  Between 7am to 6pm - Pager - [phone]  After 6pm go to [email or web address] - password [email or web address] [facility]  Office  [phone]  CC: Primary care physician; Juline Patch, MD  Note: This dictation was prepared with Dragon dictation along with smaller phrase technology. Any transcriptional errors that result from this process are unintentional.

## 2017-03-12 NOTE — ED Notes (Signed)
Transport patient to room 123.AS

## 2017-03-12 NOTE — Plan of Care (Signed)
  Progressing Education: Knowledge of General Education information will improve 03/12/2017 1946 - Progressing by Herbie Baltimore, RN Health Behavior/Discharge Planning: Ability to manage health-related needs will improve 03/12/2017 1946 - Progressing by Herbie Baltimore, RN Clinical Measurements: Ability to maintain clinical measurements within normal limits will improve 03/12/2017 1946 - Progressing by Herbie Baltimore, RN Will remain free from infection 03/12/2017 1946 - Progressing by Herbie Baltimore, RN Diagnostic test results will improve 03/12/2017 1946 - Progressing by Herbie Baltimore, RN Respiratory complications will improve 03/12/2017 1946 - Progressing by Herbie Baltimore, RN Cardiovascular complication will be avoided 03/12/2017 1946 - Progressing by Herbie Baltimore, RN Activity: Risk for activity intolerance will decrease 03/12/2017 1946 - Progressing by Herbie Baltimore, RN Nutrition: Adequate nutrition will be maintained 03/12/2017 1946 - Progressing by Herbie Baltimore, RN Coping: Level of anxiety will decrease 03/12/2017 1946 - Progressing by Herbie Baltimore, RN Elimination: Will not experience complications related to bowel motility 03/12/2017 1946 - Progressing by Herbie Baltimore, RN Will not experience complications related to urinary retention 03/12/2017 1946 - Progressing by Herbie Baltimore, RN Pain Managment: General experience of comfort will improve 03/12/2017 1946 - Progressing by Herbie Baltimore, RN Safety: Ability to remain free from injury will improve 03/12/2017 1946 - Progressing by Herbie Baltimore, RN Skin Integrity: Risk for impaired skin integrity will decrease 03/12/2017 1946 - Progressing by Herbie Baltimore, RN

## 2017-03-12 NOTE — ED Notes (Signed)
Repeat for BP lower progress, floor called and Jen notified of pt coming att

## 2017-03-13 DIAGNOSIS — R55 Syncope and collapse: Secondary | ICD-10-CM | POA: Diagnosis not present

## 2017-03-13 DIAGNOSIS — R42 Dizziness and giddiness: Secondary | ICD-10-CM | POA: Diagnosis not present

## 2017-03-13 MED ORDER — MECLIZINE HCL 25 MG PO TABS: 25.0000 mg | ORAL_TABLET | Freq: Three times a day (TID) | ORAL | 0 refills | Status: DC

## 2017-03-13 MED ORDER — ONDANSETRON HCL 4 MG PO TABS: 4.0000 mg | ORAL_TABLET | Freq: Three times a day (TID) | ORAL | Status: DC | PRN

## 2017-03-13 MED ORDER — METOCLOPRAMIDE HCL 5 MG/ML IJ SOLN
5.0000 mg | Freq: Once | INTRAMUSCULAR | Status: AC
  Administered 2017-03-13: 11:00:00 5 mg via INTRAVENOUS
  Filled 2017-03-13: qty 2

## 2017-03-13 MED ORDER — DIAZEPAM 2 MG PO TABS
2.0000 mg | ORAL_TABLET | Freq: Once | ORAL | Status: AC
Start: 2017-03-13 — End: 2017-03-13
  Administered 2017-03-13: 2 mg via ORAL
  Filled 2017-03-13: qty 1

## 2017-03-13 MED ORDER — ONDANSETRON HCL 4 MG PO TABS: 4.0000 mg | ORAL_TABLET | Freq: Three times a day (TID) | ORAL | 0 refills | Status: DC | PRN

## 2017-03-13 NOTE — Care Management Note (Signed)
Case Management Note  Patient Details  Name: Holly Smith MRN: [id number] Date of Birth: 1945-09-17  Subjective/Objective:      Spoke with Holly Smith who chose [facility] to be her home health PT provider.  This Probation officer called Holly Smith and made a request for HH=PT services. Holly Smith reported to this Probation officer that she will be staying with her son at : [address] [place], [place], phone [phone].              Action/Plan:   Expected Discharge Date:  03/13/17               Expected Discharge Plan:     In-House Referral:     Discharge planning Services     Post Acute Care Choice:    Choice offered to:     DME Arranged:    DME Agency:     HH Arranged:    HH Agency:     Status of Service:     If discussed at [employer], dates discussed:    Additional Comments:  Alexiss Iturralde A, RN 03/13/2017, 3:07 PM

## 2017-03-13 NOTE — Progress Notes (Signed)
[facility] at [place] NAME: Holly Smith    MR#:  [id number]  DATE OF BIRTH:  1945/04/25  SUBJECTIVE:  CHIEF COMPLAINT:   Chief Complaint  Patient presents with  . Dizziness  . Emesis   Patient's dizziness and spinning sensation her back.  Occasional blurred vision with the dizziness.  Worse when her eyes are open.  Sitting in a chair today. Worked with physical therapy and was found to be significantly weak.  REVIEW OF SYSTEMS:    Review of Systems  Constitutional: Positive for malaise/fatigue. Negative for chills and fever.  HENT: Negative for sore throat.   Eyes: Negative for blurred vision, double vision and pain.  Respiratory: Negative for cough, hemoptysis, shortness of breath and wheezing.   Cardiovascular: Negative for chest pain, palpitations, orthopnea and leg swelling.  Gastrointestinal: Negative for abdominal pain, constipation, diarrhea, heartburn, nausea and vomiting.  Genitourinary: Negative for dysuria and hematuria.  Musculoskeletal: Negative for back pain and joint pain.  Skin: Negative for rash.  Neurological: Positive for dizziness and weakness. Negative for sensory change, speech change, focal weakness and headaches.  Endo/Heme/Allergies: Does not bruise/bleed easily.  Psychiatric/Behavioral: Negative for depression. The patient is not nervous/anxious.     DRUG ALLERGIES:   Allergies  Allergen Reactions  . Ciprofloxacin Other (See Comments)    SOB    VITALS:  Blood pressure 137/77, pulse 75, temperature (!) 97.5 F (36.4 C), temperature source Oral, resp. rate (!) 24, height 5\' 1"  (1.549 m), weight 96.3 kg (212 lb 4.8 oz), SpO2 98 %.  PHYSICAL EXAMINATION:   Physical Exam  GENERAL:  71 y.o.-year-old patient lying in the bed with no acute distress.  EYES: Pupils equal, round, reactive to light and accommodation. No scleral icterus. Extraocular muscles intact.  HEENT: Head atraumatic, normocephalic.  Oropharynx and nasopharynx clear.  NECK:  Supple, no jugular venous distention. No thyroid enlargement, no tenderness.  LUNGS: Normal breath sounds bilaterally, no wheezing, rales, rhonchi. No use of accessory muscles of respiration.  CARDIOVASCULAR: S1, S2 normal. No murmurs, rubs, or gallops.  ABDOMEN: Soft, nontender, nondistended. Bowel sounds present. No organomegaly or mass.  EXTREMITIES: No cyanosis, clubbing or edema b/l.    NEUROLOGIC: Cranial nerves II through XII are intact. No focal Motor or sensory deficits b/l.   PSYCHIATRIC: The patient is alert and oriented x 3.  SKIN: No obvious rash, lesion, or ulcer.   LABORATORY PANEL:   CBC Recent Labs  Lab 03/11/17 1722  WBC 8.0  HGB 13.3  HCT 40.4  PLT 214   ------------------------------------------------------------------------------------------------------------------ Chemistries  Recent Labs  Lab 03/11/17 1722 03/12/17 0138  NA 139  --   K 3.1*  --   CL 103  --   CO2 27  --   GLUCOSE 188*  --   BUN 17  --   CREATININE 0.81  --   CALCIUM 9.2  --   MG  --  1.8  AST 22  --   ALT 16  --   ALKPHOS 75  --   BILITOT 0.9  --    ------------------------------------------------------------------------------------------------------------------  Cardiac Enzymes Recent Labs  Lab 03/12/17 1304  TROPONINI <0.03   ------------------------------------------------------------------------------------------------------------------  RADIOLOGY:  Ct Head Wo Contrast  Result Date: 03/11/2017 CLINICAL DATA:  Dizziness, nausea long, diarrhea started today. EXAM: CT HEAD WITHOUT CONTRAST TECHNIQUE: Contiguous axial images were obtained from the base of the skull through the vertex without intravenous contrast. COMPARISON:  None. FINDINGS: Brain: No evidence of  acute infarction, hemorrhage, extra-axial collection, ventriculomegaly, or mass effect. Generalized cerebral atrophy. Periventricular white matter low attenuation likely  secondary to microangiopathy. Vascular: Cerebrovascular atherosclerotic calcifications are noted. Skull: Negative for fracture or focal lesion. Sinuses/Orbits: Visualized portions of the orbits are unremarkable. Visualized portions of the paranasal sinuses and mastoid air cells are unremarkable. Other: None. IMPRESSION: No acute intracranial pathology. Electronically Signed   By: Kathreen Devoid   On: 03/11/2017 18:24   Mr Brain Wo Contrast  Result Date: 03/11/2017 CLINICAL DATA:  Central vertigo and diarrhea EXAM: MRI HEAD WITHOUT CONTRAST TECHNIQUE: Multiplanar, multiecho pulse sequences of the brain and surrounding structures were obtained without intravenous contrast. COMPARISON:  Head CT 03/11/2017 FINDINGS: Brain: The midline structures are normal. There is no acute infarct or acute hemorrhage. No mass lesion, hydrocephalus, dural abnormality or extra-axial collection. There is multifocal white matter hyperintensity suggesting chronic ischemic microangiopathy. Old left basal ganglia lacunar infarct. Generalized atrophy without lobar predilection. No chronic microhemorrhage or superficial siderosis. Vascular: Major intracranial arterial and venous sinus flow voids are preserved. Skull and upper cervical spine: The visualized skull base, calvarium, upper cervical spine and extracranial soft tissues are normal. Sinuses/Orbits: No fluid levels or advanced mucosal thickening. No mastoid or middle ear effusion. Normal orbits. IMPRESSION: 1. No acute intracranial abnormality or specific finding to explain the reported dizziness. 2. Mild sequelae of chronic ischemic microangiopathy. Old left basal ganglia lacunar infarct. Electronically Signed   By: Ulyses Jarred M.D.   On: 03/11/2017 22:04     ASSESSMENT AND PLAN:   * UTI On IV abx E. coli.  Sensitivities pending.  * vertigo MRI - No CVA/mass Meclizine scheduled. Will give dose of Valium.  IV Reglan.  * Vomiting has resolved Started diet  *  Afib continue home meds  *Irritable bowel syndrome with chronic intermittent diarrhea.  Discharge to skilled nursing facility tomorrow.  All the records are reviewed and case discussed with Care Management/Social Worker Management plans discussed with the patient, family and they are in agreement.  CODE STATUS: FULL CODE  DVT Prophylaxis: SCDs  TOTAL TIME TAKING CARE OF THIS PATIENT: 35 minutes.   POSSIBLE D/C IN 1-2 DAYS, DEPENDING ON CLINICAL CONDITION.  Leia Alf Kailene Steinhart M.D on 03/13/2017 at 2:35 PM  Between 7am to 6pm - Pager - [phone]  After 6pm go to [email or web address] - password [email or web address] [facility]  Office  [phone]  CC: Primary care physician; Juline Patch, MD  Note: This dictation was prepared with Dragon dictation along with smaller phrase technology. Any transcriptional errors that result from this process are unintentional.

## 2017-03-13 NOTE — Clinical Social Work Note (Signed)
The CSW spoke with the patient and her family about discharge planning. The patient and family all agree with home with home health. The patient's son and daughter-in-law will provide support. The patient agrees with the plan. The RNCM is aware. CSW is signing off. Please consult should new needs arise.

## 2017-03-13 NOTE — Care Management Obs Status (Signed)
[facility] NOTIFICATION   Patient Details  Name: Holly Smith MRN: [id number] Date of Birth: 1946/02/27   Medicare Observation Status Notification Given:  Yes    Breyson Kelm A, RN 03/13/2017, 2:20 PM

## 2017-03-13 NOTE — Progress Notes (Addendum)
Patient discharged with son, discharge instructions and prescriptions reviewed with patient and son, states understanding. IV out, catheter intact, patient tolerate well. Patient with no complaints.

## 2017-03-13 NOTE — Progress Notes (Signed)
Physical Therapy Treatment Patient Details Name: Holly Smith MRN: [id number] DOB: October 31, 1945 Today's Date: 03/13/2017    History of Present Illness Holly Smith is a 71yo white female who comes to [facility] on 12/21 d/t acute onset vertigo, and multiple episodes of vomitting, and generalized inability to AMB. Pt reports episode started with generalized 'swimmy headedness' with headturns, then progressed to romm spinning sensation, eventually unable to tolerate eyes open. She later noted some blurriness in her vision that is endorsed as different than a visual acuity problem.  She endorses some itchiness in the ears for several weeks prior. She reports some auditory changes, without significnat ringing, but with intermittent 'wooshing', as well as popping similar to with changes in barometric presure. She denies any numbness or tingling anywhere. She denies any changes to her speech. At time of PT evaluation, she reports resolution of room spining sensation, but reports some continued visual impairment up until this monring, unable to dee the dialing numbers on her mobile phone (now resolved.) She denies any recent head cold or sinus infection; she denies any prior balance or mobility deficits at baseline. She denies and prior episode similar to this. Upon arrival K+: 3.1, UA questionable to UTI, and MRI showin gonlg basal ganglia infarct only. PMH: CHF, HTN, OSA on CPAP, COPD without home O2, and ureteral stent.     PT Comments    Pt presents with deficits in strength, transfers, gait, balance, and activity tolerance.  Pt continues to present with L beating nystagmus with end range gaze to the left but none noted this session with end range gaze to the R.  Pt c/o dizziness at rest that increased slightly in standing with vital signs WNL during session.  Pt able to amb multiple times this session with RW with min instability but with no physical assistance required to prevent LOB.  Mod instability  noted during balance training most notable during head turns.  No nausea per pt during session.  Pt will benefit from PT services in a SNF setting upon discharge to safely address above deficits for decreased caregiver assistance and eventual return to PLOF.        Follow Up Recommendations  SNF     Equipment Recommendations  Rolling walker with 5" wheels    Recommendations for Other Services       Precautions / Restrictions Precautions Precautions: Fall Restrictions Weight Bearing Restrictions: No    Mobility  Bed Mobility               General bed mobility comments: Pt found in recliner, bed mobility not tested  Transfers Overall transfer level: Needs assistance Equipment used: Rolling walker (2 wheeled) Transfers: Sit to/from [facility] Sit to Stand: Min guard Stand pivot transfers: Min guard       General transfer comment: Pt steady with transfers this session with good eccentric and concentric control with min verbal cues for proper hand placement  Ambulation/Gait Ambulation/Gait assistance: Min guard Ambulation Distance (Feet): 40 Feet Assistive device: Rolling walker (2 wheeled) Gait Pattern/deviations: Step-through pattern;Decreased step length - right;Decreased step length - left   Gait velocity interpretation: <1.8 ft/sec, indicative of risk for recurrent falls General Gait Details: Pt able to amb 3 x 30-40' with RW and CGA with fair to good stability; Pt's SpO2 96% and HR 102 bpm after amb compared to 98% and 75 bpm at rest.   Only minimal increase in dizziness with amb compared to at rest.   Stairs  Wheelchair Mobility    Modified Rankin (Stroke Patients Only)       Balance Overall balance assessment: Needs assistance   Sitting balance-Leahy Scale: Good     Standing balance support: Bilateral upper extremity supported Standing balance-Leahy Scale: Fair               High level balance activites:  Head turns              Cognition Arousal/Alertness: Awake/alert Behavior During Therapy: WFL for tasks assessed/performed Overall Cognitive Status: Within Functional Limits for tasks assessed                                        Exercises Total Joint Exercises Ankle Circles/Pumps: AROM;Both;5 reps;10 reps Quad Sets: Strengthening;Both;10 reps Gluteal Sets: Strengthening;Both;10 reps Hip ABduction/ADduction: AROM;Both;10 reps Straight Leg Raises: AROM;Both;10 reps Long Arc Quad: AROM;Both;10 reps Knee Flexion: AROM;Both;10 reps Marching in Standing: AROM;Both;5 reps;10 reps Other Exercises Other Exercises: Static standing balance training without UE support with combinations of eyes open/closed and head still/head turns    General Comments        Pertinent Vitals/Pain Pain Assessment: No/denies pain    Home Living                      Prior Function            PT Goals (current goals can now be found in the care plan section) Progress towards PT goals: Progressing toward goals    Frequency    7X/week      PT Plan Discharge plan needs to be updated    Co-evaluation              AM-PAC PT "6 Clicks" Daily Activity  Outcome Measure                   End of Session Equipment Utilized During Treatment: Gait belt Activity Tolerance: Patient tolerated treatment well Patient left: in chair;with call bell/phone within reach;with chair alarm set Nurse Communication: Mobility status;Other (comment)(Chair alarm with intermittent beeping, possible low battery) PT Visit Diagnosis: Unsteadiness on feet (R26.81);Other symptoms and signs involving the nervous system (R29.898);Dizziness and giddiness (R42)     Time: 8270-7867 PT Time Calculation (min) (ACUTE ONLY): 40 min  Charges:  $Gait Training: 8-22 mins $Therapeutic Exercise: 23-37 mins                    G Codes:       DRoyetta Asal PT, DPT 03/13/17, 1:44  PM

## 2017-03-13 NOTE — NC FL2 (Signed)
[place] LEVEL OF CARE SCREENING TOOL     IDENTIFICATION  Patient Name: Holly Smith Birthdate: 1946-01-06 Sex: female Admission Date (Current Location): 03/11/2017  [place] and [place] Number:  Engineering geologist and Address:  [facility], [address], [place], [place] [address]      Provider Number: [id number]  Attending Physician Name and Address:  Hillary Bow, MD  Relative Name and Phone Number:       Current Level of Care: Hospital Recommended Level of Care: [place] Prior Approval Number:    Date Approved/Denied: 03/13/17 PASRR Number: [id number]  Discharge Plan: SNF    Current Diagnoses: Patient Active Problem List   Diagnosis Date Noted  . Vertigo 03/11/2017  . Sepsis ([place]) 12/13/2016  . Dyspepsia   . Gastritis without bleeding   . Other specified diseases of esophagus   . History of alcohol use 09/07/2016  . Hypertensive heart disease 05/12/2016  . Atrial fibrillation, persistent ([place]) 08/13/2015  . COPD (chronic obstructive pulmonary disease) ([place]) 08/13/2015  . Personal history of other specified conditions 08/13/2015  . Insulin resistance 08/13/2015  . Obesity, unspecified 08/13/2015  . Special screening for malignant neoplasms, colon   . Benign neoplasm of ascending colon   . Benign neoplasm of descending colon   . Benign neoplasm of sigmoid colon   . Chronic diastolic heart failure ([place]) 05/05/2015  . Diverticulitis 01/31/2015  . Diverticulitis large intestine w/o perforation or abscess w/bleeding 01/28/2015  . Chronic combined systolic and diastolic CHF, NYHA class 3 ([place]) 01/28/2015  . Acute diverticulitis 01/28/2015  . Diverticulitis of large intestine without perforation or abscess with bleeding   . Acute on chronic diastolic heart failure ([place]) 01/21/2015  . Hypertensive urgency 01/21/2015  . Morbid obesity ([place]) 01/21/2015  . Blood in stool 01/21/2015  . Acute  diastolic CHF (congestive heart failure) ([place]) 01/21/2015  . CAFL (chronic airflow limitation) ([place]) 12/16/2014  . Personal history of perinatal problems 12/16/2014  . HTN (hypertension) 12/16/2014  . Hyperlipidemia, unspecified 12/16/2014  . Dysmetabolic syndrome 69/48/5462  . Kidney stones 12/16/2014  . PVC (premature ventricular contraction) 12/16/2014  . Snoring 12/16/2014  . Stress incontinence 12/16/2014  . Fast heart beat 12/16/2014  . Calculus (=stone) 12/16/2014  . OSA (obstructive sleep apnea) 11/27/2014  . Dyspnea 10/07/2014  . Essential hypertension 10/07/2014  . Obesity 10/07/2014  . Genital warts 08/02/2014  . Condyloma acuminatum of vulva 08/02/2014  . Condylomata lata of vulva 08/02/2014  . Anticoagulation adequate 08/02/2014  . Chronic atrial fibrillation ([place]) 08/02/2014  . Current tobacco use 08/02/2014  . 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 Malignant neoplasm of vulva ([place]) 06/19/2014  . Vulvar cancer ([facility]) 05/10/2014  . Vulvar cancer, carcinoma ([place]) 05/10/2014  . 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 Chest pain 08/20/2013  . Breath shortness 08/20/2013  . CCF (congestive cardiac failure) ([place]) 08/03/2013  . CHF (congestive heart failure) ([place]) 08/03/2013  . H/O transesophageal echocardiography (TEE) for monitoring 06/21/2013  . Echocardiogram abnormal 04/23/2013  . Mitral regurgitation 04/23/2013  . TI (tricuspid incompetence) 04/23/2013    Orientation RESPIRATION BLADDER Height & Weight     Self, Time, Situation, Place  Normal Continent Weight: 212 lb 4.8 oz (96.3 kg) Height:  5\' 1"  (154.9 cm)  BEHAVIORAL SYMPTOMS/MOOD NEUROLOGICAL BOWEL NUTRITION STATUS      Continent Diet(2 gram sodium)  AMBULATORY STATUS COMMUNICATION OF NEEDS Skin   Extensive Assist Verbally Normal  Personal Care Assistance Level of Assistance  Bathing, Feeding, Dressing Bathing Assistance: Limited assistance Feeding  assistance: Independent Dressing Assistance: Limited assistance     Functional Limitations Info             SPECIAL CARE FACTORS FREQUENCY  PT (By licensed PT)     PT Frequency: up to 5X per day, 5 days per week              Contractures Contractures Info: Not present    Additional Factors Info  Code Status, Allergies, Psychotropic Code Status Info: Full Allergies Info: Ciprofloxacin Psychotropic Info: Zoloft         Current Medications (03/13/2017):  This is the current hospital active medication list Current Facility-Administered Medications  Medication Dose Route Frequency Provider Last Rate Last Dose  . albuterol (PROVENTIL) (2.5 MG/3ML) 0.083% nebulizer solution 2.5 mg  2.5 mg Inhalation Q8H PRN Salary, Montell D, MD      . cefTRIAXone (ROCEPHIN) 1 g in dextrose 5 % 50 mL IVPB  1 g Intravenous Q24H Salary, Montell D, MD   Stopped at 03/13/17 0911  . furosemide (LASIX) tablet 80 mg  80 mg Oral Daily Salary, Montell D, MD   80 mg at 03/13/17 0734  . hydrALAZINE (APRESOLINE) injection 10 mg  10 mg Intravenous Q4H PRN Loney Hering D, MD   10 mg at 03/11/17 2333  . irbesartan (AVAPRO) tablet 37.5 mg  37.5 mg Oral Daily Hillary Bow, MD   37.5 mg at 03/13/17 0735  . meclizine (ANTIVERT) tablet 25 mg  25 mg Oral TID Hillary Bow, MD   25 mg at 03/13/17 0745  . MEDLINE mouth rinse  15 mL Mouth Rinse BID Hillary Bow, MD   15 mL at 03/13/17 0755  . metoprolol tartrate (LOPRESSOR) tablet 150 mg  150 mg Oral BID Loney Hering D, MD   150 mg at 03/13/17 0734  . ondansetron (ZOFRAN) tablet 4 mg  4 mg Oral Q8H PRN Sudini, Srikar, MD      . pantoprazole (PROTONIX) EC tablet 40 mg  40 mg Oral Daily Salary, Montell D, MD   40 mg at 03/13/17 0734  . rivaroxaban (XARELTO) tablet 20 mg  20 mg Oral Q supper Salary, Holly Bodily D, MD   20 mg at 03/12/17 1633  . sertraline (ZOLOFT) tablet 25 mg  25 mg Oral Daily Salary, Montell D, MD   25 mg at 03/13/17 0734  . temazepam  (RESTORIL) capsule 15 mg  15 mg Oral QHS PRN Salary, Montell D, MD      . triamcinolone (NASACORT) nasal inhaler 2 spray  2 spray Nasal Daily Salary, Montell D, MD   2 spray at 03/13/17 0757  . vitamin B-12 (CYANOCOBALAMIN) tablet 1,000 mcg  1,000 mcg Oral Corky Downs D, MD   1,000 mcg at 03/12/17 1610     Discharge Medications: Please see discharge summary for a list of discharge medications.  Relevant Imaging Results:  Relevant Lab Results:   Additional Information SS# [id number]  Zettie Pho, LCSW

## 2017-03-14 LAB — URINE CULTURE: Culture: >=100000 — AB

## 2017-03-24 DIAGNOSIS — G4733 Obstructive sleep apnea (adult) (pediatric): Secondary | ICD-10-CM | POA: Diagnosis not present

## 2017-03-24 DIAGNOSIS — Z960 Presence of urogenital implants: Secondary | ICD-10-CM | POA: Diagnosis not present

## 2017-03-24 DIAGNOSIS — K219 Gastro-esophageal reflux disease without esophagitis: Secondary | ICD-10-CM | POA: Diagnosis not present

## 2017-03-24 DIAGNOSIS — I110 Hypertensive heart disease with heart failure: Secondary | ICD-10-CM | POA: Diagnosis not present

## 2017-03-24 DIAGNOSIS — N390 Urinary tract infection, site not specified: Secondary | ICD-10-CM | POA: Diagnosis not present

## 2017-03-24 DIAGNOSIS — J449 Chronic obstructive pulmonary disease, unspecified: Secondary | ICD-10-CM | POA: Diagnosis not present

## 2017-03-24 DIAGNOSIS — R42 Dizziness and giddiness: Secondary | ICD-10-CM | POA: Diagnosis not present

## 2017-03-24 DIAGNOSIS — Z7901 Long term (current) use of anticoagulants: Secondary | ICD-10-CM | POA: Diagnosis not present

## 2017-03-24 DIAGNOSIS — I482 Chronic atrial fibrillation: Secondary | ICD-10-CM | POA: Diagnosis not present

## 2017-03-24 DIAGNOSIS — I509 Heart failure, unspecified: Secondary | ICD-10-CM | POA: Diagnosis not present

## 2017-03-28 DIAGNOSIS — I509 Heart failure, unspecified: Secondary | ICD-10-CM | POA: Diagnosis not present

## 2017-03-28 DIAGNOSIS — I482 Chronic atrial fibrillation: Secondary | ICD-10-CM | POA: Diagnosis not present

## 2017-03-28 DIAGNOSIS — R42 Dizziness and giddiness: Secondary | ICD-10-CM | POA: Diagnosis not present

## 2017-03-28 DIAGNOSIS — I110 Hypertensive heart disease with heart failure: Secondary | ICD-10-CM | POA: Diagnosis not present

## 2017-03-28 DIAGNOSIS — Z960 Presence of urogenital implants: Secondary | ICD-10-CM | POA: Diagnosis not present

## 2017-03-28 DIAGNOSIS — J449 Chronic obstructive pulmonary disease, unspecified: Secondary | ICD-10-CM | POA: Diagnosis not present

## 2017-03-28 DIAGNOSIS — Z7901 Long term (current) use of anticoagulants: Secondary | ICD-10-CM | POA: Diagnosis not present

## 2017-03-28 DIAGNOSIS — K219 Gastro-esophageal reflux disease without esophagitis: Secondary | ICD-10-CM | POA: Diagnosis not present

## 2017-03-28 DIAGNOSIS — G4733 Obstructive sleep apnea (adult) (pediatric): Secondary | ICD-10-CM | POA: Diagnosis not present

## 2017-03-28 DIAGNOSIS — N390 Urinary tract infection, site not specified: Secondary | ICD-10-CM | POA: Diagnosis not present

## 2017-03-28 NOTE — Discharge Summary (Signed)
[place] [facility] [place] NAME: Holly Smith    MR#:  [id number]  DATE OF BIRTH:  1945/08/03  DATE OF ADMISSION:  03/11/2017 ADMITTING PHYSICIAN: Gorden Harms, MD  DATE OF DISCHARGE: 03/13/2017  5:30 PM  PRIMARY CARE PHYSICIAN: Juline Patch, MD   ADMISSION DIAGNOSIS:  Lower urinary tract infectious disease [N39.0] Dizziness [R42] Nausea [R11.0] Acute bronchitis with COPD ([place]) [J44.0, J20.9]  DISCHARGE DIAGNOSIS:  Active Problems:   Vertigo   SECONDARY DIAGNOSIS:   Past Medical History:  Diagnosis Date  . A-fib ([place])   . Abnormal Pap smear of vagina and vaginal HPV   . Arthritis    "bones ache" (01/23/2015)  . Cancer ([place])    vulvar cancer  . CHF (congestive heart failure) ([place])   . Chronic anticoagulation    She is on Xarelto for Afib  . Chronic atrial fibrillation ([place])   . Dyspnea   . Echocardiogram abnormal    Mitral regurgitation, tricuspic regurgitation  . Family history of adverse reaction to anesthesia    sister gets very nauseated and vomits  . GERD (gastroesophageal reflux disease)   . History of kidney stones   . History of stress test    Myoview 8/16: EF 42%, anteroseptal, inferoseptal, anterior, apical anterior and apical defect (likely represents breast attenuation versus scar), no ischemia; intermediate risk because of low EF  . HPV (human papilloma virus) anogenital infection    03/21/12 PAP + HR HPV  . Hypertension   . Insulin resistance   . Kidney stone   . Kidney stones   . Obesity   . OSA on CPAP   . Postoperative nausea and vomiting 1983   many years ago with hysterectomy  . Urolithiasis   . Vulvar cancer ([place]) 2016   invasive verrucous carcinoma of the vulva. History of genital warts and vulvar condyloma     ADMITTING HISTORY  HISTORY OF PRESENT ILLNESS: Holly Smith  is a 72 y.o. female with a known history per below, presenting with acute dizziness/vertigo started around noon today  associated with nausea and emesis, in the emergency room patient found to have potassium 3.1, urinalysis suspicious for UTI, MRI brain done noted for old left basal ganglia infarct, patient resting comfortably in bed, family at the bedside, patient is now being referred to the observation unit for acute vertigo, probable UTI and hypokalemia.    HOSPITAL COURSE:   * E coli UTI On IV abx in hospital and transitioned to PO at discharge Afebrile and normal WBC  * vertigo MRI - No CVA/mass Meclizine scheduled Improving No nystagmus or focal weakness  * Vomiting has resolved Started diet and tolerating well  * Afib continue home meds   SNF recommended by PT but patient chose to go home with good family support. [facility] PT set up   CONSULTS OBTAINED:    DRUG ALLERGIES:   Allergies  Allergen Reactions  . Ciprofloxacin Other (See Comments)    SOB    DISCHARGE MEDICATIONS:   Allergies as of 03/13/2017      Reactions   Ciprofloxacin Other (See Comments)   SOB      Medication List    TAKE these medications   acetaminophen 325 MG tablet Commonly known as:  TYLENOL Take 2 tablets (650 mg total) by mouth every 6 (six) hours as needed for mild pain (or Fever >/= 101).   albuterol 108 (90 Base) MCG/ACT inhaler Commonly known as:  PROVENTIL HFA;VENTOLIN  HFA Inhale 2 puffs into the lungs every 8 (eight) hours as needed for wheezing or shortness of breath.   furosemide 40 MG tablet Commonly known as:  LASIX Take 80 mg by mouth daily.   irbesartan 75 MG tablet Commonly known as:  AVAPRO Take 37.5 mg by mouth daily.   meclizine 25 MG tablet Commonly known as:  ANTIVERT Take 1 tablet (25 mg total) by mouth 3 (three) times daily. Take scheduled for 3 days and then 3 times a day as needed for Vertigo   metoprolol tartrate 100 MG tablet Commonly known as:  LOPRESSOR Take 150 mg by mouth 2 (two) times daily.   NASACORT ALLERGY 24HR 55 MCG/ACT Aero nasal inhaler Generic  drug:  triamcinolone Place 2 sprays into the nose daily as needed.   omeprazole 20 MG capsule Commonly known as:  PRILOSEC Take 20 mg by mouth daily.   ondansetron 4 MG tablet Commonly known as:  ZOFRAN Take 1 tablet (4 mg total) by mouth every 8 (eight) hours as needed for nausea or vomiting.   rivaroxaban 20 MG Tabs tablet Commonly known as:  XARELTO Take 20 mg by mouth daily with supper.   sertraline 25 MG tablet Commonly known as:  ZOLOFT Take 25 mg by mouth daily.   vitamin B-12 1000 MCG tablet Commonly known as:  CYANOCOBALAMIN Take 1,000 mcg by mouth every other day.       Today   VITAL SIGNS:  Blood pressure 137/77, pulse 75, temperature (!) 97.5 F (36.4 C), temperature source Oral, resp. rate (!) 24, height 5\' 1"  (1.549 m), weight 96.3 kg (212 lb 4.8 oz), SpO2 98 %.  I/O:  No intake or output data in the 24 hours ending 03/28/17 1528  PHYSICAL EXAMINATION:  Physical Exam  GENERAL:  72 y.o.-year-old patient lying in the bed with no acute distress.  LUNGS: Normal breath sounds bilaterally, no wheezing, rales,rhonchi or crepitation. No use of accessory muscles of respiration.  CARDIOVASCULAR: S1, S2 normal. No murmurs, rubs, or gallops.  ABDOMEN: Soft, non-tender, non-distended. Bowel sounds present. No organomegaly or mass.  NEUROLOGIC: Moves all 4 extremities. PSYCHIATRIC: The patient is alert and oriented x 3.  SKIN: No obvious rash, lesion, or ulcer.   DATA REVIEW:   CBC No results for input(s): WBC, HGB, HCT, PLT in the last 168 hours.  Chemistries  No results for input(s): NA, K, CL, CO2, GLUCOSE, BUN, CREATININE, CALCIUM, MG, AST, ALT, ALKPHOS, BILITOT in the last 168 hours.  Invalid input(s): GFRCGP  Cardiac Enzymes No results for input(s): TROPONINI in the last 168 hours.  Microbiology Results  Results for orders placed or performed during the hospital encounter of 03/11/17  Urine Culture     Status: Abnormal   Collection Time: 03/11/17   7:56 PM  Result Value Ref Range Status   Specimen Description   Final    URINE, RANDOM Performed at [facility], [address], [place], [place] [address]    Special Requests   Final    NONE Performed at [facility], [address] [place], [place] [address]    Culture >=100,000 COLONIES/mL ESCHERICHIA COLI (A)  Final   Report Status 03/14/2017 FINAL  Final   Organism ID, Bacteria ESCHERICHIA COLI (A)  Final      Susceptibility   Escherichia coli - MIC*    AMPICILLIN >=32 RESISTANT Resistant     CEFAZOLIN <=4 SENSITIVE Sensitive     CEFTRIAXONE <=1 SENSITIVE Sensitive  CIPROFLOXACIN >=4 RESISTANT Resistant     GENTAMICIN <=1 SENSITIVE Sensitive     IMIPENEM <=0.25 SENSITIVE Sensitive     NITROFURANTOIN <=16 SENSITIVE Sensitive     TRIMETH/SULFA <=20 SENSITIVE Sensitive     AMPICILLIN/SULBACTAM >=32 RESISTANT Resistant     PIP/TAZO 8 SENSITIVE Sensitive     Extended ESBL NEGATIVE Sensitive     * >=100,000 COLONIES/mL ESCHERICHIA COLI    RADIOLOGY:  No results found.  Follow up with PCP in 1 week.  Management plans discussed with the patient, family and they are in agreement.  CODE STATUS:  Code Status History    Date Active Date Inactive Code Status Order ID Comments User Context   03/12/2017 01:13 03/13/2017 20:41 Full Code [id number]  Gorden Harms, MD Inpatient   12/13/2016 21:02 12/16/2016 18:58 Full Code [id number]  Hillary Bow, MD ED   01/28/2015 21:36 01/31/2015 17:05 Full Code [id number]  Reyne Dumas, MD ED   01/21/2015 18:51 01/24/2015 18:07 Full Code [id number]  Leanor Kail, PA Inpatient      TOTAL TIME TAKING CARE OF THIS PATIENT ON DAY OF DISCHARGE: more than 30 minutes.   Leia Alf Alanna Storti M.D on 03/28/2017 at 3:28 PM  Between 7am to 6pm - Pager - [phone]  After 6pm go to [email or web address] - password [email or web address] [facility]  Office  [phone]  CC: Primary care physician; Juline Patch,  MD  Note: This dictation was prepared with Dragon dictation along with smaller phrase technology. Any transcriptional errors that result from this process are unintentional.

## 2017-03-31 ENCOUNTER — Other Ambulatory Visit: Payer: Self-pay | Admitting: Interventional Cardiology

## 2017-03-31 DIAGNOSIS — Z7901 Long term (current) use of anticoagulants: Secondary | ICD-10-CM | POA: Diagnosis not present

## 2017-03-31 DIAGNOSIS — K219 Gastro-esophageal reflux disease without esophagitis: Secondary | ICD-10-CM | POA: Diagnosis not present

## 2017-03-31 DIAGNOSIS — R42 Dizziness and giddiness: Secondary | ICD-10-CM | POA: Diagnosis not present

## 2017-03-31 DIAGNOSIS — I509 Heart failure, unspecified: Secondary | ICD-10-CM | POA: Diagnosis not present

## 2017-03-31 DIAGNOSIS — I110 Hypertensive heart disease with heart failure: Secondary | ICD-10-CM | POA: Diagnosis not present

## 2017-03-31 DIAGNOSIS — J449 Chronic obstructive pulmonary disease, unspecified: Secondary | ICD-10-CM | POA: Diagnosis not present

## 2017-03-31 DIAGNOSIS — Z960 Presence of urogenital implants: Secondary | ICD-10-CM | POA: Diagnosis not present

## 2017-03-31 DIAGNOSIS — N390 Urinary tract infection, site not specified: Secondary | ICD-10-CM | POA: Diagnosis not present

## 2017-03-31 DIAGNOSIS — I482 Chronic atrial fibrillation: Secondary | ICD-10-CM | POA: Diagnosis not present

## 2017-03-31 DIAGNOSIS — G4733 Obstructive sleep apnea (adult) (pediatric): Secondary | ICD-10-CM | POA: Diagnosis not present

## 2017-04-04 DIAGNOSIS — Z7901 Long term (current) use of anticoagulants: Secondary | ICD-10-CM | POA: Diagnosis not present

## 2017-04-04 DIAGNOSIS — G4733 Obstructive sleep apnea (adult) (pediatric): Secondary | ICD-10-CM | POA: Diagnosis not present

## 2017-04-04 DIAGNOSIS — J449 Chronic obstructive pulmonary disease, unspecified: Secondary | ICD-10-CM | POA: Diagnosis not present

## 2017-04-04 DIAGNOSIS — Z960 Presence of urogenital implants: Secondary | ICD-10-CM | POA: Diagnosis not present

## 2017-04-04 DIAGNOSIS — R42 Dizziness and giddiness: Secondary | ICD-10-CM | POA: Diagnosis not present

## 2017-04-04 DIAGNOSIS — I509 Heart failure, unspecified: Secondary | ICD-10-CM | POA: Diagnosis not present

## 2017-04-04 DIAGNOSIS — I110 Hypertensive heart disease with heart failure: Secondary | ICD-10-CM | POA: Diagnosis not present

## 2017-04-04 DIAGNOSIS — K219 Gastro-esophageal reflux disease without esophagitis: Secondary | ICD-10-CM | POA: Diagnosis not present

## 2017-04-04 DIAGNOSIS — I482 Chronic atrial fibrillation: Secondary | ICD-10-CM | POA: Diagnosis not present

## 2017-04-04 DIAGNOSIS — N390 Urinary tract infection, site not specified: Secondary | ICD-10-CM | POA: Diagnosis not present

## 2017-04-06 DIAGNOSIS — I509 Heart failure, unspecified: Secondary | ICD-10-CM | POA: Diagnosis not present

## 2017-04-06 DIAGNOSIS — J449 Chronic obstructive pulmonary disease, unspecified: Secondary | ICD-10-CM | POA: Diagnosis not present

## 2017-04-06 DIAGNOSIS — I482 Chronic atrial fibrillation: Secondary | ICD-10-CM | POA: Diagnosis not present

## 2017-04-06 DIAGNOSIS — G4733 Obstructive sleep apnea (adult) (pediatric): Secondary | ICD-10-CM | POA: Diagnosis not present

## 2017-04-06 DIAGNOSIS — R42 Dizziness and giddiness: Secondary | ICD-10-CM | POA: Diagnosis not present

## 2017-04-06 DIAGNOSIS — K219 Gastro-esophageal reflux disease without esophagitis: Secondary | ICD-10-CM | POA: Diagnosis not present

## 2017-04-06 DIAGNOSIS — I110 Hypertensive heart disease with heart failure: Secondary | ICD-10-CM | POA: Diagnosis not present

## 2017-04-06 DIAGNOSIS — Z7901 Long term (current) use of anticoagulants: Secondary | ICD-10-CM | POA: Diagnosis not present

## 2017-04-06 DIAGNOSIS — N390 Urinary tract infection, site not specified: Secondary | ICD-10-CM | POA: Diagnosis not present

## 2017-04-06 DIAGNOSIS — Z960 Presence of urogenital implants: Secondary | ICD-10-CM | POA: Diagnosis not present

## 2017-04-13 ENCOUNTER — Ambulatory Visit: Payer: Medicare HMO

## 2017-04-13 ENCOUNTER — Ambulatory Visit (INDEPENDENT_AMBULATORY_CARE_PROVIDER_SITE_OTHER): Payer: Medicare HMO

## 2017-04-13 VITALS — BP 122/70 | HR 68 | Temp 98.2°F | Resp 12 | Ht 60.0 in | Wt 215.0 lb

## 2017-04-13 DIAGNOSIS — Z23 Encounter for immunization: Secondary | ICD-10-CM | POA: Diagnosis not present

## 2017-04-13 NOTE — Progress Notes (Signed)
Subjective:   Holly Smith is a 72 y.o. female who presents for an Initial Medicare Annual Wellness Visit.  Review of Systems    N/A  Cardiac Risk Factors include: advanced age (>74men, >31 women);dyslipidemia;hypertension;obesity (BMI >30kg/m2)     Objective:    Today's Vitals   04/13/17 1015  BP: 122/70  Pulse: 68  Resp: 12  Temp: 98.2 F (36.8 C)  TempSrc: Oral  Weight: 215 lb (97.5 kg)  Height: 5' (1.524 m)   Body mass index is 41.99 kg/m.  Advanced Directives 04/13/2017 03/12/2017 03/11/2017 02/23/2017 12/29/2016 12/27/2016 12/13/2016  Does Patient Have a Medical Advance Directive? No No No No No No No  Would patient like information on creating a medical advance directive? Yes (MAU/Ambulatory/Procedural Areas - Information given) No - Patient declined Yes (ED - Information included in AVS) No - Patient declined No - Patient declined - No - Patient declined    Current Medications (verified) Outpatient Encounter Medications as of 04/13/2017  Medication Sig  . acetaminophen (TYLENOL) 325 MG tablet Take 2 tablets (650 mg total) by mouth every 6 (six) hours as needed for mild pain (or Fever >/= 101).  Marland Kitchen albuterol (PROVENTIL HFA;VENTOLIN HFA) 108 (90 Base) MCG/ACT inhaler Inhale 2 puffs into the lungs every 8 (eight) hours as needed for wheezing or shortness of breath.  . furosemide (LASIX) 40 MG tablet Take 80 mg by mouth daily.  . irbesartan (AVAPRO) 75 MG tablet Take 1 tablet (75 mg total) by mouth daily. Make an appointment with Dr. Irish Lack for further refills.  . meclizine (ANTIVERT) 25 MG tablet Take 1 tablet (25 mg total) by mouth 3 (three) times daily. Take scheduled for 3 days and then 3 times a day as needed for Vertigo  . metoprolol tartrate (LOPRESSOR) 100 MG tablet Take 150 mg by mouth 2 (two) times daily.  Marland Kitchen omeprazole (PRILOSEC) 20 MG capsule Take 20 mg by mouth daily.  . rivaroxaban (XARELTO) 20 MG TABS tablet Take 20 mg by mouth daily with supper.  .  sertraline (ZOLOFT) 25 MG tablet Take 25 mg by mouth daily.  Marland Kitchen triamcinolone (NASACORT ALLERGY 24HR) 55 MCG/ACT AERO nasal inhaler Place 2 sprays into the nose daily as needed.   . vitamin B-12 (CYANOCOBALAMIN) 1000 MCG tablet Take 1,000 mcg by mouth every other day.   . ondansetron (ZOFRAN) 4 MG tablet Take 1 tablet (4 mg total) by mouth every 8 (eight) hours as needed for nausea or vomiting. (Patient not taking: Reported on 04/13/2017)   Facility-Administered Encounter Medications as of 04/13/2017  Medication  . albuterol (PROVENTIL) (2.5 MG/3ML) 0.083% nebulizer solution 2.5 mg    Allergies (verified) Ciprofloxacin   History: Past Medical History:  Diagnosis Date  . A-fib ([place])   . Abnormal Pap smear of vagina and vaginal HPV   . Arthritis    "bones ache" (01/23/2015)  . Cancer ([place])    vulvar cancer  . CHF (congestive heart failure) ([place])   . Chronic anticoagulation    She is on Xarelto for Afib  . Chronic atrial fibrillation ([place])   . Dyspnea   . Echocardiogram abnormal    Mitral regurgitation, tricuspic regurgitation  . Family history of adverse reaction to anesthesia    sister gets very nauseated and vomits  . GERD (gastroesophageal reflux disease)   . History of kidney stones   . History of stress test    Myoview 8/16: EF 42%, anteroseptal, inferoseptal, anterior, apical anterior and apical defect (likely represents breast  attenuation versus scar), no ischemia; intermediate risk because of low EF  . HPV (human papilloma virus) anogenital infection    03/21/12 PAP + HR HPV  . Hypertension   . Insulin resistance   . Kidney stone   . Kidney stones   . Obesity   . OSA on CPAP   . Postoperative nausea and vomiting 1983   many years ago with hysterectomy  . Urolithiasis   . Vertigo   . Vulvar cancer ([place]) 2016   invasive verrucous carcinoma of the vulva. History of genital warts and vulvar condyloma   Past Surgical History:  Procedure Laterality Date  . ABDOMINAL  HYSTERECTOMY    . APPENDECTOMY    . BILATERAL SALPINGOOPHORECTOMY  2003   benign ovarian cancer  . CARDIOVERSION  2014-2015   "[place]"  . COLONOSCOPY WITH PROPOFOL N/A 06/10/2015   Procedure: COLONOSCOPY WITH PROPOFOL;  Surgeon: Lucilla Lame, MD;  Location: [facility] END[facility]COPY;  Service: Endoscopy;  Laterality: N/A;  . CYSTOSCOPY W/ RETROGRADES Bilateral 10/19/2016   Procedure: CYSTOSCOPY WITH RETROGRADE PYELOGRAM;  Surgeon: Hollice Espy, MD;  Location: [facility] ORS;  Service: Urology;  Laterality: Bilateral;  . CYSTOSCOPY W/ URETERAL STENT PLACEMENT Left 12/29/2016   Procedure: CYSTOSCOPY WITH STENT REPLACEMENT;  Surgeon: Hollice Espy, MD;  Location: [facility] ORS;  Service: Urology;  Laterality: Left;  . CYSTOSCOPY WITH BIOPSY N/A 10/19/2016   Procedure: CYSTOSCOPY WITH BLADDER BIOPSY, VAGINAL WALL BIOPSY;  Surgeon: Hollice Espy, MD;  Location: [facility] ORS;  Service: Urology;  Laterality: N/A;  . CYSTOSCOPY WITH STENT PLACEMENT Left 12/14/2016   Procedure: CYSTOSCOPY WITH STENT PLACEMENT;  Surgeon: Hollice Espy, MD;  Location: [facility] OR[facility];  Service: Urology;  Laterality: Left;  . ESOPHAGOGASTRODUODENOSCOPY (EGD) WITH PROPOFOL N/A 10/05/2016   Surgeon: Lucilla Lame, MD;  Location: [facility] ENDOSCOPY;  Results: Barrett's Esophagus- repeat in 3 years 09/2019  . LITHOTRIPSY    . STONE EXTRACTION WITH BASKET Left 12/29/2016   Procedure: STONE EXTRACTION WITH BASKET;  Surgeon: Hollice Espy, MD;  Location: [facility] ORS;  Service: Urology;  Laterality: Left;  . TONSILLECTOMY    . URETEROSCOPY Left 12/29/2016   Procedure: URETEROSCOPY;  Surgeon: Hollice Espy, MD;  Location: [facility] OR[facility];  Service: Urology;  Laterality: Left;  Marland Kitchen VULVECTOMY Right 05/10/2014   Excisional biopsy of the superior right labial majora mass; [place] PARTIAL  07/02/2014   Re-excision and sentinel node dissection at [facility].    Family History  Problem Relation Age of Onset  . Prostate cancer Brother 32        still living and well  . Heart attack Father   . Stroke Mother   . Diabetes Mother   . Hypertension Mother   . Heart attack Brother   . Pancreatic cancer Brother   . Heart disease Sister   . Breast cancer Neg Hx   . Kidney cancer Neg Hx   . Bladder Cancer Neg Hx    Social History   Socioeconomic History  . Marital status: Divorced    Spouse name: None  . Number of children: 1  . Years of education: GED  . Highest education level: 12th grade  Social Needs  . Financial resource strain: Not hard at all  . Food insecurity - worry: Never true  . Food insecurity - inability: Never true  . Transportation needs - medical: No  . Transportation needs - non-medical: No  Occupational History  . Occupation: Retired  Tobacco Use  . Smoking status: Former Smoker  Packs/day: 1.00    Years: 50.00    Pack years: 50.00    Types: Cigarettes    Last attempt to quit: 05/20/2012    Years since quitting: 4.9  . Smokeless tobacco: Never Used  . Tobacco comment: smoking cessation materials not required  Substance and Sexual Activity  . Alcohol use: No    Alcohol/week: 0.0 oz  . Drug use: No  . Sexual activity: Not Currently  Other Topics Concern  . None  Social History Narrative  . None    Tobacco Counseling Counseling given: No Comment: smoking cessation materials not required   Clinical Intake:  Pre-visit preparation completed: Yes  Pain : No/denies pain   BMI - recorded: 41.99 Nutritional Status: BMI > 30  Obese Nutritional Risks: None Diabetes: No  How often do you need to have someone help you when you read instructions, pamphlets, or other written materials from your doctor or pharmacy?: 1 - Never  Interpreter Needed?: No  Information entered by :: AE[place]sole, LPN   Activities of Daily Living In your present state of health, do you have any difficulty performing the following activities: 04/13/2017 03/12/2017  Hearing? N N  Comment denies wearing hearing aids -    Vision? N Y  Comment wears glasses and contacts -  Difficulty concentrating or making decisions? N Y  Comment short term memory loss -  Walking or climbing stairs? N Y  Comment - -  Dressing or bathing? N N  Doing errands, shopping? N Y  Conservation officer, nature and eating ? N -  Comment denies wearing dentures -  Using the Toilet? N -  In the past six months, have you accidently leaked urine? Y -  Comment wears pad -  Do you have problems with loss of bowel control? Y -  Comment dumping syndrome -  Managing your Medications? N -  Managing your Finances? N -  Housekeeping or managing your Housekeeping? N -  Some recent data might be hidden     Immunizations and Health Maintenance Immunization History  Administered Date(s) Administered  . Influenza,inj,Quad PF,6+ Mos 01/24/2015  . Pneumococcal Conjugate-13 04/13/2017  . Pneumococcal Polysaccharide-23 01/24/2015   Health Maintenance Due  Topic Date Due  . Hepatitis C Screening  March 30, 1945  . OPHTHALMOLOGY EXAM  11/28/1953  . DEXA SCAN  11/28/2008  . MAMMOGRAM  04/01/2017  . FOOT EXAM  03/18/2017    Patient Care Team: Juline Patch, MD as PCP - General (Family Medicine) Jettie Booze, MD as Consulting Physician (Cardiology) Thompson Grayer, MD as Consulting Physician (Cardiology) Hollice Espy, MD as Consulting Physician (Urology) Lucilla Lame, MD as Consulting Physician (Gastroenterology) Mellody Drown, MD as Consulting Physician (Obstetrics and Gynecology) Tanda Rockers, MD as Consulting Physician (Pulmonary Disease) Erby Pian, MD as Consulting Physician (Specialist)  Indicate any recent Medical Services you may have received from other than [facility] providers in the past year (date may be approximate).     Assessment:   This is a routine wellness examination for Holly Smith.  Hearing/Vision screen Vision Screening Comments: Sees Dr. Wyatt Portela for annual eye exams  Dietary issues and exercise activities  discussed: Current Exercise Habits: Structured exercise class, Type of exercise: strength training/weights, Time (Minutes): 60, Frequency (Times/Week): 2, Weekly Exercise (Minutes/Week): 120, Intensity: Mild, Exercise limited by: None identified  Goals    . DIET - INCREASE WATER INTAKE     Recommend to drink at least 6-8 8oz glasses of water per day.  Depression Screen PHQ 2/9 Scores 04/13/2017 03/18/2016  PHQ - 2 Score 0 3  PHQ- 9 Score - 12    Fall Risk Fall Risk  04/13/2017 03/18/2016  Falls in the past year? No No    Is the patient's home free of loose throw rugs in walkways, pet beds, electrical cords, etc?   Yes Does the patient have any grab bars in the bathroom? No  Does the patient use a shower chair when bathing? Yes Does the patient have any stairs in or around the home? Yes If so, are there any handrails?  Yes Does the patient have adequate lighting?  Yes Does the patient use a cane, walker or w/c? Yes, uses walker or cane for ambulation Does the patient use of an elevated toilet seat? Yes  Timed Get Up and Go Performed: Yes. Pt ambulated 10 feet within 20 sec. Gait slow and steady with use of an assistive device. No intervention required at this time. Fall risk prevention has been discussed.  Cognitive Function:     6CIT Screen 04/13/2017  What Year? 0 points  What month? 0 points  What time? 0 points  Count back from 20 0 points  Months in reverse 0 points  Repeat phrase 0 points  Total Score 0    Screening Tests Health Maintenance  Topic Date Due  . Hepatitis C Screening  September 20, 1945  . OPHTHALMOLOGY EXAM  11/28/1953  . DEXA SCAN  11/28/2008  . MAMMOGRAM  04/01/2017  . FOOT EXAM  03/18/2017  . INFLUENZA VACCINE  12/14/2017 (Originally 10/20/2016)  . TETANUS/TDAP  04/13/2018 (Originally 11/29/1962)  . HEMOGLOBIN A1C  06/14/2017  . COLONOSCOPY  06/09/2020  . PNA vac Low Risk Adult  Completed    Qualifies for Shingles Vaccine? Yes. Due for Zostavax or  Shingrix vaccine. Education has been provided regarding the importance of this vaccine. Pt has been advised to call her insurance company to determine her out of pocket expense. Advised she may also receive this vaccine at her local pharmacy or Health Dept. Verbalized acceptance and understanding.  Due for Tdap vaccine. Declined my offer to administer today. Education has been provided regarding the importance of this vaccine but still declined. Pt has been advised to call our office if she should change her mind. Also advised her insurance company to determine her out of pocket expense. Advised she may also receive this vaccine at her local pharmacy or Health Dept. Verbalized acceptance and understanding.  Due for Flu vaccine. Declined my offer to administer today. States she received this vaccine from [facility]. Advised to provide our office with a copy of her immunization record. Verbalized acceptance and understanding.  Cancer Screenings:   Lung Cancer Screening: Low Dose CT Chest recommended if Age 70-80 years, 30 pack-year currently smoking OR have quit w/in 15years. Patient does qualify. An Epic message has been sent to Burgess Estelle, RN (Oncology Nurse Navigator) regarding the possible need for this exam. Raquel Sarna will review the patient's chart and review all previous imaging, then will collaborate with radiology to determine if the patient truly qualifies for the exam. If the patient qualifies, Raquel Sarna will order the Low Dose CT of the chest and will call the patient to schedule the exam.  Breast: Up to date on Mammogram? No. Completed 04/01/16. Repeat every year. Ordered today. The patient has been provided with contact information to schedule her own appointment.   Up to date of Bone Density/Dexa? No. Ordered today. Message sent to referral coordinator for  scheduling purposes. Pt aware that she will receive a call from our office with appt information.   Colorectal: Colonoscopy completed 06/10/15.  Repeat every 5 years.  Additional Screenings: Hepatitis B/HIV/Syphillis: Does not qualify Hepatitis C Screening: Ordered today   Plan:  I have personally reviewed and addressed the Medicare Annual Wellness questionnaire and have noted the following in the patient's chart:  A. Medical and social history B. Use of alcohol, tobacco or illicit drugs  C. Current medications and supplements D. Functional ability and status E.  Nutritional status F.  Physical activity G. Advance directives H. List of other physicians I.  Hospitalizations, surgeries, and ER visits in previous 12 months J.  [place] such as hearing and vision if needed, cognitive and depression L. Referrals and appointments - none  In addition, I have reviewed and discussed with patient certain preventive protocols, quality metrics, and best practice recommendations. A written personalized care plan for preventive services as well as general preventive health recommendations were provided to patient.  Signed,  Aleatha Borer, LPN Nurse Health Advisor  MD Recommendations: Due for Zostavax or Shingrix vaccine. Education has been provided regarding the importance of this vaccine. Pt has been advised to call her insurance company to determine her out of pocket expense. Advised she may also receive this vaccine at her local pharmacy or Health Dept. Verbalized acceptance and understanding.  Due for Tdap vaccine. Declined my offer to administer today. Education has been provided regarding the importance of this vaccine but still declined. Pt has been advised to call our office if she should change her mind. Also advised her insurance company to determine her out of pocket expense. Advised she may also receive this vaccine at her local pharmacy or Health Dept. Verbalized acceptance and understanding.  Due for Flu vaccine. Declined my offer to administer today. States she received this vaccine from [facility]. Advised to provide  our office with a copy of her immunization record. Verbalized acceptance and understanding.  Patient does qualify for Lung Cancer Screening. An Epic message has been sent to Burgess Estelle, RN (Oncology Nurse Navigator) regarding the possible need for this exam. Raquel Sarna will review the patient's chart and review all previous imaging, then will collaborate with radiology to determine if the patient truly qualifies for the exam. If the patient qualifies, Raquel Sarna will order the Low Dose CT of the chest and will call the patient to schedule the exam.  Due for mammogram. Ordered today. The patient has been provided with contact information to schedule her own appointment.  Due for DEXA. Ordered today. Message sent to referral coordinator for scheduling purposes. Pt aware that she will receive a call from our office with appt information.   Due for Hep C screening. Labs ordered today. Pt provided with lab req for completion.

## 2017-04-13 NOTE — Patient Instructions (Signed)
Holly Smith , Thank you for taking time to come for your Medicare Wellness Visit. I appreciate your ongoing commitment to your health goals. Please review the following plan we discussed and let me know if I can assist you in the future.   Screening recommendations/referrals: Colorectal Screening: Colonoscopy completed 06/10/15. Repeat every 5 years. Mammogram: Completed 04/01/16. Repeat every year. Bone Density: Ordered today Recommended yearly ophthalmology/optometry visit for glaucoma screening and checkup Recommended yearly dental visit for hygiene and checkup  Vaccinations: Influenza vaccine: Declined Pneumococcal vaccine: Completed series today Tdap vaccine: Declined. Please call your insurance company to determine your out of pocket expense. You may also receive this vaccine at your local pharmacy or Health Dept. Shingles vaccine: Declined. Please call your insurance company to determine your out of pocket expense. You may also receive this vaccine at your local pharmacy or Health Dept.  Advanced directives: Advance directive discussed with you today. I have provided a copy for you to complete at home and have notarized. Once this is complete please bring a copy in to our office so we can scan it into your chart.  Conditions/risks identified: Recommend to drink at least 6-8 8oz glasses of water per day.  Next appointment: Please schedule an appointment with Dr. Ronnald Ramp within the next 30 days for follow up after today's Annual Wellness Visit.  Please schedule your Annual Wellness Visit with your Nurse Health Advisor in one year.  Preventive Care 55 Years and Older, Female Preventive care refers to lifestyle choices and visits with your health care provider that can promote health and wellness. What does preventive care include?  A yearly physical exam. This is also called an annual well check.  Dental exams once or twice a year.  Routine eye exams. Ask your health care provider how  often you should have your eyes checked.  Personal lifestyle choices, including:  Daily care of your teeth and gums.  Regular physical activity.  Eating a healthy diet.  Avoiding tobacco and drug use.  Limiting alcohol use.  Practicing safe sex.  Taking low-dose aspirin every day.  Taking vitamin and mineral supplements as recommended by your health care provider. What happens during an annual well check? The services and screenings done by your health care provider during your annual well check will depend on your age, overall health, lifestyle risk factors, and family history of disease. Counseling  Your health care provider may ask you questions about your:  Alcohol use.  Tobacco use.  Drug use.  Emotional well-being.  Home and relationship well-being.  Sexual activity.  Eating habits.  History of falls.  Memory and ability to understand (cognition).  Work and work Statistician.  Reproductive health. Screening  You may have the following tests or measurements:  Height, weight, and BMI.  Blood pressure.  Lipid and cholesterol levels. These may be checked every 5 years, or more frequently if you are over 108 years old.  Skin check.  Lung cancer screening. You may have this screening every year starting at age 23 if you have a 30-pack-year history of smoking and currently smoke or have quit within the past 15 years.  Fecal occult blood test (FOBT) of the stool. You may have this test every year starting at age 5.  Flexible sigmoidoscopy or colonoscopy. You may have a sigmoidoscopy every 5 years or a colonoscopy every 10 years starting at age 66.  Hepatitis C blood test.  Hepatitis B blood test.  Sexually transmitted disease (STD) testing.  Diabetes  screening. This is done by checking your blood sugar (glucose) after you have not eaten for a while (fasting). You may have this done every 1-3 years.  Bone density scan. This is done to screen for  osteoporosis. You may have this done starting at age 23.  Mammogram. This may be done every 1-2 years. Talk to your health care provider about how often you should have regular mammograms. Talk with your health care provider about your test results, treatment options, and if necessary, the need for more tests. Vaccines  Your health care provider may recommend certain vaccines, such as:  Influenza vaccine. This is recommended every year.  Tetanus, diphtheria, and acellular pertussis (Tdap, Td) vaccine. You may need a Td booster every 10 years.  Zoster vaccine. You may need this after age 80.  Pneumococcal 13-valent conjugate (PCV13) vaccine. One dose is recommended after age 78.  Pneumococcal polysaccharide (PPSV23) vaccine. One dose is recommended after age 56. Talk to your health care provider about which screenings and vaccines you need and how often you need them. This information is not intended to replace advice given to you by your health care provider. Make sure you discuss any questions you have with your health care provider. Document Released: 04/04/2015 Document Revised: 11/26/2015 Document Reviewed: 01/07/2015 Elsevier Interactive Patient Education  2017 [place] Prevention in the Home Falls can cause injuries. They can happen to people of all ages. There are many things you can do to make your home safe and to help prevent falls. What can I do on the outside of my home?  Regularly fix the edges of walkways and driveways and fix any cracks.  Remove anything that might make you trip as you walk through a door, such as a raised step or threshold.  Trim any bushes or trees on the path to your home.  Use bright outdoor lighting.  Clear any walking paths of anything that might make someone trip, such as rocks or tools.  Regularly check to see if handrails are loose or broken. Make sure that both sides of any steps have handrails.  Any raised decks and porches  should have guardrails on the edges.  Have any leaves, snow, or ice cleared regularly.  Use sand or salt on walking paths during winter.  Clean up any spills in your garage right away. This includes oil or grease spills. What can I do in the bathroom?  Use night lights.  Install grab bars by the toilet and in the tub and shower. Do not use towel bars as grab bars.  Use non-skid mats or decals in the tub or shower.  If you need to sit down in the shower, use a plastic, non-slip stool.  Keep the floor dry. Clean up any water that spills on the floor as soon as it happens.  Remove soap buildup in the tub or shower regularly.  Attach bath mats securely with double-sided non-slip rug tape.  Do not have throw rugs and other things on the floor that can make you trip. What can I do in the bedroom?  Use night lights.  Make sure that you have a light by your bed that is easy to reach.  Do not use any sheets or blankets that are too big for your bed. They should not hang down onto the floor.  Have a firm chair that has side arms. You can use this for support while you get dressed.  Do not have throw rugs  and other things on the floor that can make you trip. What can I do in the kitchen?  Clean up any spills right away.  Avoid walking on wet floors.  Keep items that you use a lot in easy-to-reach places.  If you need to reach something above you, use a strong step stool that has a grab bar.  Keep electrical cords out of the way.  Do not use floor polish or wax that makes floors slippery. If you must use wax, use non-skid floor wax.  Do not have throw rugs and other things on the floor that can make you trip. What can I do with my stairs?  Do not leave any items on the stairs.  Make sure that there are handrails on both sides of the stairs and use them. Fix handrails that are broken or loose. Make sure that handrails are as long as the stairways.  Check any carpeting to  make sure that it is firmly attached to the stairs. Fix any carpet that is loose or worn.  Avoid having throw rugs at the top or bottom of the stairs. If you do have throw rugs, attach them to the floor with carpet tape.  Make sure that you have a light switch at the top of the stairs and the bottom of the stairs. If you do not have them, ask someone to add them for you. What else can I do to help prevent falls?  Wear shoes that:  Do not have high heels.  Have rubber bottoms.  Are comfortable and fit you well.  Are closed at the toe. Do not wear sandals.  If you use a stepladder:  Make sure that it is fully opened. Do not climb a closed stepladder.  Make sure that both sides of the stepladder are locked into place.  Ask someone to hold it for you, if possible.  Clearly mark and make sure that you can see:  Any grab bars or handrails.  First and last steps.  Where the edge of each step is.  Use tools that help you move around (mobility aids) if they are needed. These include:  Canes.  Walkers.  Scooters.  Crutches.  Turn on the lights when you go into a dark area. Replace any light bulbs as soon as they burn out.  Set up your furniture so you have a clear path. Avoid moving your furniture around.  If any of your floors are uneven, fix them.  If there are any pets around you, be aware of where they are.  Review your medicines with your doctor. Some medicines can make you feel dizzy. This can increase your chance of falling. Ask your doctor what other things that you can do to help prevent falls. This information is not intended to replace advice given to you by your health care provider. Make sure you discuss any questions you have with your health care provider. Document Released: 01/02/2009 Document Revised: 08/14/2015 Document Reviewed: 04/12/2014 Elsevier Interactive Patient Education  2017 [employer].

## 2017-04-14 ENCOUNTER — Telehealth: Payer: Self-pay | Admitting: *Deleted

## 2017-04-14 NOTE — Telephone Encounter (Signed)
Received referral for low dose lung cancer screening CT scan. Message left at phone number listed in EMR for patient to call me back to facilitate scheduling scan.  

## 2017-04-15 ENCOUNTER — Other Ambulatory Visit: Payer: Self-pay | Admitting: Interventional Cardiology

## 2017-04-15 NOTE — Telephone Encounter (Signed)
Xarelto 20mg  refill request received; pt was 72 yrs old, 98kg, Cr-0.81 on 03/11/17, last seen by Dr. Irish Lack at 05/12/16, CrCl-95.19ml/min. Will send in refill to requested pharmacy.

## 2017-04-18 DIAGNOSIS — I509 Heart failure, unspecified: Secondary | ICD-10-CM | POA: Diagnosis not present

## 2017-04-18 DIAGNOSIS — N390 Urinary tract infection, site not specified: Secondary | ICD-10-CM | POA: Diagnosis not present

## 2017-04-18 DIAGNOSIS — J449 Chronic obstructive pulmonary disease, unspecified: Secondary | ICD-10-CM | POA: Diagnosis not present

## 2017-04-18 DIAGNOSIS — R42 Dizziness and giddiness: Secondary | ICD-10-CM | POA: Diagnosis not present

## 2017-04-18 DIAGNOSIS — K219 Gastro-esophageal reflux disease without esophagitis: Secondary | ICD-10-CM | POA: Diagnosis not present

## 2017-04-18 DIAGNOSIS — G4733 Obstructive sleep apnea (adult) (pediatric): Secondary | ICD-10-CM | POA: Diagnosis not present

## 2017-04-18 DIAGNOSIS — I482 Chronic atrial fibrillation: Secondary | ICD-10-CM | POA: Diagnosis not present

## 2017-04-18 DIAGNOSIS — Z960 Presence of urogenital implants: Secondary | ICD-10-CM | POA: Diagnosis not present

## 2017-04-18 DIAGNOSIS — I110 Hypertensive heart disease with heart failure: Secondary | ICD-10-CM | POA: Diagnosis not present

## 2017-04-18 DIAGNOSIS — Z7901 Long term (current) use of anticoagulants: Secondary | ICD-10-CM | POA: Diagnosis not present

## 2017-04-21 ENCOUNTER — Ambulatory Visit
Admission: RE | Admit: 2017-04-21 | Discharge: 2017-04-21 | Disposition: A | Discharge status: Home / Self Care | Payer: Medicare HMO | Source: Ambulatory Visit | Attending: Family Medicine | Admitting: Family Medicine

## 2017-04-21 DIAGNOSIS — E2839 Other primary ovarian failure: Secondary | ICD-10-CM | POA: Diagnosis not present

## 2017-04-21 DIAGNOSIS — I110 Hypertensive heart disease with heart failure: Secondary | ICD-10-CM | POA: Diagnosis not present

## 2017-04-21 DIAGNOSIS — M85852 Other specified disorders of bone density and structure, left thigh: Secondary | ICD-10-CM | POA: Insufficient documentation

## 2017-04-21 DIAGNOSIS — M8588 Other specified disorders of bone density and structure, other site: Secondary | ICD-10-CM | POA: Insufficient documentation

## 2017-04-21 DIAGNOSIS — Z13820 Encounter for screening for osteoporosis: Secondary | ICD-10-CM | POA: Diagnosis not present

## 2017-04-21 DIAGNOSIS — Z1231 Encounter for screening mammogram for malignant neoplasm of breast: Secondary | ICD-10-CM | POA: Insufficient documentation

## 2017-04-21 DIAGNOSIS — Z1239 Encounter for other screening for malignant neoplasm of breast: Secondary | ICD-10-CM

## 2017-04-21 DIAGNOSIS — R42 Dizziness and giddiness: Secondary | ICD-10-CM | POA: Diagnosis not present

## 2017-04-21 DIAGNOSIS — I482 Chronic atrial fibrillation: Secondary | ICD-10-CM | POA: Diagnosis not present

## 2017-04-21 DIAGNOSIS — J449 Chronic obstructive pulmonary disease, unspecified: Secondary | ICD-10-CM | POA: Diagnosis not present

## 2017-04-21 DIAGNOSIS — M8589 Other specified disorders of bone density and structure, multiple sites: Secondary | ICD-10-CM | POA: Diagnosis not present

## 2017-04-21 DIAGNOSIS — Z780 Asymptomatic menopausal state: Secondary | ICD-10-CM | POA: Insufficient documentation

## 2017-04-21 DIAGNOSIS — G4733 Obstructive sleep apnea (adult) (pediatric): Secondary | ICD-10-CM | POA: Diagnosis not present

## 2017-04-21 DIAGNOSIS — N390 Urinary tract infection, site not specified: Secondary | ICD-10-CM | POA: Diagnosis not present

## 2017-04-21 DIAGNOSIS — K219 Gastro-esophageal reflux disease without esophagitis: Secondary | ICD-10-CM | POA: Diagnosis not present

## 2017-04-21 DIAGNOSIS — I509 Heart failure, unspecified: Secondary | ICD-10-CM | POA: Diagnosis not present

## 2017-04-21 DIAGNOSIS — Z960 Presence of urogenital implants: Secondary | ICD-10-CM | POA: Diagnosis not present

## 2017-04-21 DIAGNOSIS — Z7901 Long term (current) use of anticoagulants: Secondary | ICD-10-CM | POA: Diagnosis not present

## 2017-04-27 ENCOUNTER — Other Ambulatory Visit: Payer: Self-pay | Admitting: Family Medicine

## 2017-04-27 ENCOUNTER — Ambulatory Visit: Payer: Medicare HMO | Admitting: Urology

## 2017-04-27 ENCOUNTER — Ambulatory Visit
Admission: RE | Admit: 2017-04-27 | Discharge: 2017-04-27 | Disposition: A | Discharge status: Home / Self Care | Payer: Medicare HMO | Source: Ambulatory Visit | Attending: Urology | Admitting: Urology

## 2017-04-27 DIAGNOSIS — Z87442 Personal history of urinary calculi: Secondary | ICD-10-CM | POA: Insufficient documentation

## 2017-04-27 DIAGNOSIS — F32A Depression, unspecified: Secondary | ICD-10-CM

## 2017-04-27 DIAGNOSIS — N200 Calculus of kidney: Secondary | ICD-10-CM | POA: Diagnosis not present

## 2017-04-27 DIAGNOSIS — F329 Major depressive disorder, single episode, unspecified: Secondary | ICD-10-CM

## 2017-04-27 DIAGNOSIS — F419 Anxiety disorder, unspecified: Secondary | ICD-10-CM

## 2017-04-28 NOTE — Progress Notes (Signed)
KUB only today, called with result.  No stones.  Hollice Espy, MD

## 2017-04-29 ENCOUNTER — Telehealth: Payer: Self-pay | Admitting: Urology

## 2017-04-29 NOTE — Telephone Encounter (Signed)
-----   Message from Hollice Espy, MD sent at 04/27/2017  4:23 PM EST ----- Awesome news!  No stones today.    Hollice Espy, MD

## 2017-04-29 NOTE — Telephone Encounter (Signed)
Called and let patient know  Holly Smith

## 2017-05-02 ENCOUNTER — Other Ambulatory Visit: Payer: Self-pay | Admitting: Interventional Cardiology

## 2017-05-31 ENCOUNTER — Other Ambulatory Visit: Payer: Self-pay | Admitting: Interventional Cardiology

## 2017-06-01 ENCOUNTER — Other Ambulatory Visit: Payer: Self-pay | Admitting: Family Medicine

## 2017-06-01 ENCOUNTER — Other Ambulatory Visit: Payer: Self-pay | Admitting: Interventional Cardiology

## 2017-06-01 DIAGNOSIS — F329 Major depressive disorder, single episode, unspecified: Secondary | ICD-10-CM

## 2017-06-01 DIAGNOSIS — F32A Depression, unspecified: Secondary | ICD-10-CM

## 2017-06-01 DIAGNOSIS — F419 Anxiety disorder, unspecified: Secondary | ICD-10-CM

## 2017-06-12 ENCOUNTER — Other Ambulatory Visit: Payer: Self-pay | Admitting: Interventional Cardiology

## 2017-06-13 NOTE — Telephone Encounter (Signed)
Please advise on refill request as patient is overdue for an appointment and has been given multiple refills with a note to call and schedule an appointment. Patient has failed to do so. Thanks, MI

## 2017-06-13 NOTE — Telephone Encounter (Signed)
Appointment scheduled for patient to see Dr. Irish Lack on 06/24/17 at 3:20 PM. Patient states that she currently has a 30 day supply of the irbesartan. Patient understands to keep appointment for refills and to continue taking current supply.

## 2017-06-17 ENCOUNTER — Other Ambulatory Visit: Payer: Self-pay | Admitting: Interventional Cardiology

## 2017-06-24 ENCOUNTER — Encounter: Payer: Self-pay | Admitting: Interventional Cardiology

## 2017-06-24 ENCOUNTER — Ambulatory Visit: Payer: Medicare HMO | Admitting: Interventional Cardiology

## 2017-06-24 VITALS — BP 122/68 | HR 67 | Ht 60.0 in | Wt 216.0 lb

## 2017-06-24 DIAGNOSIS — I119 Hypertensive heart disease without heart failure: Secondary | ICD-10-CM

## 2017-06-24 DIAGNOSIS — E782 Mixed hyperlipidemia: Secondary | ICD-10-CM

## 2017-06-24 DIAGNOSIS — I482 Chronic atrial fibrillation, unspecified: Secondary | ICD-10-CM

## 2017-06-24 DIAGNOSIS — I5033 Acute on chronic diastolic (congestive) heart failure: Secondary | ICD-10-CM

## 2017-06-24 NOTE — Progress Notes (Signed)
Cardiology Office Note   Date:  06/24/2017   ID:  Holly Smith, Holly Smith 03-05-1946, MRN [id number]  PCP:  Juline Patch, MD    No chief complaint on file.  Chronic diastolic heart failure  Wt Readings from Last 3 Encounters:  06/24/17 216 lb (98 kg)  04/13/17 215 lb (97.5 kg)  03/13/17 212 lb 4.8 oz (96.3 kg)       History of Present Illness: Holly Smith is a 72 y.o. female  Who had PAF and a tachycardia mediated cardiomyopathy in 2015.  She was cardioverted at [facility].  She saw me for the first time in 2016.    She had been on Lasix 80 mg daily.  She cut that back to 40 mg daily about a month ago.    She has noticed more shortness of breath with activity.  She feels a little bit more swollen.  Her weight has gone up on her home scale.  She denies palpitations.  She does not report any chest discomfort.  She denies lightheadedness or syncope.     Past Medical History:  Diagnosis Date  . A-fib ([place])   . Abnormal Pap smear of vagina and vaginal HPV   . Arthritis    "bones ache" (01/23/2015)  . Cancer ([place])    vulvar cancer  . CHF (congestive heart failure) ([place])   . Chronic anticoagulation    She is on Xarelto for Afib  . Chronic atrial fibrillation ([place])   . Dyspnea   . Echocardiogram abnormal    Mitral regurgitation, tricuspic regurgitation  . Family history of adverse reaction to anesthesia    sister gets very nauseated and vomits  . GERD (gastroesophageal reflux disease)   . History of kidney stones   . History of stress test    Myoview 8/16: EF 42%, anteroseptal, inferoseptal, anterior, apical anterior and apical defect (likely represents breast attenuation versus scar), no ischemia; intermediate risk because of low EF  . HPV (human papilloma virus) anogenital infection    03/21/12 PAP + HR HPV  . Hypertension   . Insulin resistance   . Kidney stone   . Kidney stones   . Obesity   . OSA on CPAP   . Postoperative nausea and vomiting 1983   many years ago with hysterectomy  . Urolithiasis   . Vertigo   . Vulvar cancer ([place]) 2016   invasive verrucous carcinoma of the vulva. History of genital warts and vulvar condyloma    Past Surgical History:  Procedure Laterality Date  . ABDOMINAL HYSTERECTOMY    . APPENDECTOMY    . BILATERAL SALPINGOOPHORECTOMY  2003   benign ovarian cancer  . CARDIOVERSION  2014-2015   "[place]"  . COLONOSCOPY WITH PROPOFOL N/A 06/10/2015   Procedure: COLONOSCOPY WITH PROPOFOL;  Surgeon: Lucilla Lame, MD;  Location: [facility] ENDOSCOPY;  Service: Endoscopy;  Laterality: N/A;  . CYSTOSCOPY W/ RETROGRADES Bilateral 10/19/2016   Procedure: CYSTOSCOPY WITH RETROGRADE PYELOGRAM;  Surgeon: Hollice Espy, MD;  Location: [facility] ORS;  Service: Urology;  Laterality: Bilateral;  . CYSTOSCOPY W/ URETERAL STENT PLACEMENT Left 12/29/2016   Procedure: CYSTOSCOPY WITH STENT REPLACEMENT;  Surgeon: Hollice Espy, MD;  Location: [facility] ORS;  Service: Urology;  Laterality: Left;  . CYSTOSCOPY WITH BIOPSY N/A 10/19/2016   Procedure: CYSTOSCOPY WITH BLADDER BIOPSY, VAGINAL WALL BIOPSY;  Surgeon: Hollice Espy, MD;  Location: [facility] ORS;  Service: Urology;  Laterality: N/A;  . CYSTOSCOPY WITH STENT PLACEMENT Left 12/14/2016   Procedure: CYSTOSCOPY  WITH STENT PLACEMENT;  Surgeon: Hollice Espy, MD;  Location: [facility] ORS;  Service: Urology;  Laterality: Left;  . ESOPHAGOGASTRODUODENOSCOPY (EGD) WITH PROPOFOL N/A 10/05/2016   Surgeon: Lucilla Lame, MD;  Location: [facility] ENDOSCOPY;  Results: Barrett's Esophagus- repeat in 3 years 09/2019  . LITHOTRIPSY    . STONE EXTRACTION WITH BASKET Left 12/29/2016   Procedure: STONE EXTRACTION WITH BASKET;  Surgeon: Hollice Espy, MD;  Location: [facility] ORS;  Service: Urology;  Laterality: Left;  . TONSILLECTOMY    . URETEROSCOPY Left 12/29/2016   Procedure: URETEROSCOPY;  Surgeon: Hollice Espy, MD;  Location: [facility] ORS;  Service: Urology;  Laterality: Left;  Marland Kitchen VULVECTOMY Right 05/10/2014    Excisional biopsy of the superior right labial majora mass; [place] PARTIAL  07/02/2014   Re-excision and sentinel node dissection at [facility].      Current Outpatient Medications  Medication Sig Dispense Refill  . acetaminophen (TYLENOL) 325 MG tablet Take 2 tablets (650 mg total) by mouth every 6 (six) hours as needed for mild pain (or Fever >/= 101).    Marland Kitchen albuterol (PROVENTIL HFA;VENTOLIN HFA) 108 (90 Base) MCG/ACT inhaler Inhale 2 puffs into the lungs every 8 (eight) hours as needed for wheezing or shortness of breath. 1 Inhaler 6  . furosemide (LASIX) 40 MG tablet TAKE 2 TABLETS (80 MG TOTAL) BY MOUTH DAILY. 60 tablet 0  . irbesartan (AVAPRO) 75 MG tablet Take 1 tablet (75 mg total) by mouth daily. Patient needs an appointment for further refills 3rd/final attempt 15 tablet 0  . metoprolol tartrate (LOPRESSOR) 100 MG tablet Take 150 mg by mouth 2 (two) times daily.    Marland Kitchen omeprazole (PRILOSEC) 20 MG capsule Take 20 mg by mouth daily.    . rivaroxaban (XARELTO) 20 MG TABS tablet Take 20 mg by mouth daily with supper.    . sertraline (ZOLOFT) 25 MG tablet Take 25 mg by mouth daily.    Marland Kitchen triamcinolone (NASACORT ALLERGY 24HR) 55 MCG/ACT AERO nasal inhaler Place 2 sprays into the nose daily as needed.     . vitamin B-12 (CYANOCOBALAMIN) 1000 MCG tablet Take 1,000 mcg by mouth every other day.      Current Facility-Administered Medications  Medication Dose Route Frequency Provider Last Rate Last Dose  . albuterol (PROVENTIL) (2.5 MG/3ML) 0.083% nebulizer solution 2.5 mg  2.5 mg Nebulization Once Juline Patch, MD        Allergies:   Ciprofloxacin    Social History:  The patient  reports that she quit smoking about 5 years ago. Her smoking use included cigarettes. She has a 50.00 pack-year smoking history. She has never used smokeless tobacco. She reports that she does not drink alcohol or use drugs.   Family History:  The patient's family history includes Diabetes in  her mother; Heart attack in her brother and father; Heart disease in her sister; Hypertension in her mother; Pancreatic cancer in her brother; Prostate cancer (age of onset: 34) in her brother; Stroke in her mother.    ROS:  Please see the history of present illness.   Otherwise, review of systems are positive for increased shortness of breath.   All other systems are reviewed and negative.    PHYSICAL EXAM: VS:  BP 122/68   Pulse 67   Ht 5' (1.524 m)   Wt 216 lb (98 kg)   SpO2 91%   BMI 42.18 kg/m  , BMI Body mass index is 42.18 kg/m. GEN: Well nourished, well developed, in  no acute distress  HEENT: normal  Neck: no JVD, carotid bruits, or masses Cardiac:  irregularly irregular; no murmurs, rubs, or gallops; trace lower extremity edema  Respiratory:  clear to auscultation bilaterally, normal work of breathing GI: soft, nontender, nondistended, + BS MS: no deformity or atrophy  Skin: warm and dry, no rash Neuro:  Strength and sensation are intact Psych: euthymic mood, full affect     Recent Labs: 09/07/2016: TSH 2.050 12/13/2016: B Natriuretic Peptide 155.0 03/11/2017: ALT 16; BUN 17; Creatinine, Ser 0.81; Hemoglobin 13.3; Platelets 214; Potassium 3.1; Sodium 139 03/12/2017: Magnesium 1.8   Lipid Panel    Component Value Date/Time   CHOL 165 03/18/2016 1056   TRIG 171 (H) 03/18/2016 1056   HDL 42 03/18/2016 1056   CHOLHDL 3.9 03/18/2016 1056   CHOLHDL 4.4 01/22/2015 0455   VLDL 21 01/22/2015 0455   LDLCALC 89 03/18/2016 1056     Other studies Reviewed: Additional studies/ records that were reviewed today with results demonstrating: 2016 echo showed normal LV function, normal valvular function.   ASSESSMENT AND PLAN:  1. Acute on Chronic diastolic heart failure: She appears mildly volume overloaded.  Increase Lasix back to her prescribed dose of 80 mg daily.  If she does not feel any improvement, she will let us know.  We will plan to check labs in about a  week. 2. AFib: Rate controlled.  Continue current rate slowing medicines.  Xarelto for stroke prevention. 3. Hypertensive heart disease: Blood pressure appears to be well controlled.  Continue current medications. 4. Morbid obesity: She has had some weight gain of late.  Some of this is likely fluid.  Long-term, she will benefit from weight loss. 5. TG elevated in the past.  She needs lipids checked again.   Current medicines are reviewed at length with the patient today.  The patient concerns regarding her medicines were addressed.  The following changes have been made:  No change  Labs/ tests ordered today include:  No orders of the defined types were placed in this encounter.   Recommend 150 minutes/week of aerobic exercise Low fat, low carb, high fiber diet recommended  Disposition:   FU in 1 month with APP   Signed, Larae Grooms, MD  06/24/2017 3:57 PM    [facility] [place], [place], [place]  [address] Phone: [phone]; Fax: [phone]

## 2017-06-24 NOTE — Patient Instructions (Signed)
Medication Instructions:  Your physician recommends that you continue on your current medications as directed. Please refer to the Current Medication list given to you today.  TAKE: furosemide (lasix) 80 mg daily  Labwork: Your physician recommends that you return for a FASTING lipid profile and a basic metabolic panel in 1 week at the [facility].   Testing/Procedures: None ordered  Follow-Up: Your physician wants you to follow-up in: 1 month with APP on Dr. Hassell Done team  Any Other Special Instructions Will Be Listed Below (If Applicable).     If you need a refill on your cardiac medications before your next appointment, please call your pharmacy.

## 2017-06-27 ENCOUNTER — Other Ambulatory Visit: Payer: Self-pay | Admitting: Interventional Cardiology

## 2017-06-28 ENCOUNTER — Other Ambulatory Visit: Payer: Self-pay | Admitting: Interventional Cardiology

## 2017-06-29 ENCOUNTER — Other Ambulatory Visit: Payer: Self-pay | Admitting: Family Medicine

## 2017-06-29 DIAGNOSIS — F419 Anxiety disorder, unspecified: Secondary | ICD-10-CM

## 2017-06-29 DIAGNOSIS — F329 Major depressive disorder, single episode, unspecified: Secondary | ICD-10-CM

## 2017-06-29 DIAGNOSIS — F32A Depression, unspecified: Secondary | ICD-10-CM

## 2017-07-04 ENCOUNTER — Other Ambulatory Visit: Payer: Self-pay | Admitting: Family Medicine

## 2017-07-04 ENCOUNTER — Other Ambulatory Visit
Admission: RE | Admit: 2017-07-04 | Discharge: 2017-07-04 | Disposition: A | Discharge status: Home / Self Care | Payer: Medicare HMO | Source: Ambulatory Visit | Attending: Interventional Cardiology | Admitting: Interventional Cardiology

## 2017-07-04 DIAGNOSIS — I5033 Acute on chronic diastolic (congestive) heart failure: Secondary | ICD-10-CM | POA: Diagnosis not present

## 2017-07-04 DIAGNOSIS — E782 Mixed hyperlipidemia: Secondary | ICD-10-CM | POA: Diagnosis not present

## 2017-07-04 DIAGNOSIS — J209 Acute bronchitis, unspecified: Secondary | ICD-10-CM

## 2017-07-04 DIAGNOSIS — J440 Chronic obstructive pulmonary disease with acute lower respiratory infection: Secondary | ICD-10-CM

## 2017-07-04 LAB — LIPID PANEL
CHOLESTEROL: 149 mg/dL (ref 0–200)
HDL: 36 mg/dL — ABNORMAL LOW (ref >40)
LDL Cholesterol: 93 mg/dL (ref 0–99)
TRIGLYCERIDES: 100 mg/dL (ref <150)
Total CHOL/HDL Ratio: 4.1 RATIO
VLDL: 20 mg/dL (ref 0–40)

## 2017-07-04 LAB — BASIC METABOLIC PANEL
ANION GAP: 7 (ref 5–15)
BUN: 16 mg/dL (ref 6–20)
CALCIUM: 9 mg/dL (ref 8.9–10.3)
CO2: 32 mmol/L (ref 22–32)
Chloride: 100 mmol/L — ABNORMAL LOW (ref 101–111)
Creatinine, Ser: 0.88 mg/dL (ref 0.44–1.00)
GFR calc Af Amer: >60 mL/min (ref >60)
GFR calc non Af Amer: >60 mL/min (ref >60)
GLUCOSE: 146 mg/dL — AB (ref 65–99)
Potassium: 3.2 mmol/L — ABNORMAL LOW (ref 3.5–5.1)
Sodium: 139 mmol/L (ref 135–145)

## 2017-07-05 ENCOUNTER — Telehealth: Payer: Self-pay

## 2017-07-05 DIAGNOSIS — E876 Hypokalemia: Secondary | ICD-10-CM

## 2017-07-05 DIAGNOSIS — I5032 Chronic diastolic (congestive) heart failure: Secondary | ICD-10-CM

## 2017-07-05 MED ORDER — POTASSIUM CHLORIDE CRYS ER 20 MEQ PO TBCR: 40.0000 meq | EXTENDED_RELEASE_TABLET | Freq: Every day | ORAL | 3 refills | Status: DC

## 2017-07-05 NOTE — Telephone Encounter (Signed)
Called and made patient aware of lab results and that her potassium was low. Instructed patient to start potassium 40 mEq QD. Rx sent to preferred pharmacy. BMET ordered and patient instructed to have this drawn in 2 weeks at [facility]. Patient verbalized understanding and thanked me for the call.

## 2017-07-05 NOTE — Telephone Encounter (Signed)
-----   Message from Jettie Booze, MD sent at 07/05/2017 12:42 PM EDT ----- Start potassium 40 mEq daily.  Recheck BMet in 2 weeks

## 2017-07-12 ENCOUNTER — Other Ambulatory Visit: Payer: Self-pay | Admitting: Internal Medicine

## 2017-07-14 NOTE — Telephone Encounter (Signed)
Patient has not seen Dr Rayann Heman since 2016, but has been seeing Dr Irish Lack. Okay to refill? If okay, please advise on current dose. Thanks, MI

## 2017-07-15 ENCOUNTER — Other Ambulatory Visit: Payer: Self-pay | Admitting: Interventional Cardiology

## 2017-07-19 ENCOUNTER — Other Ambulatory Visit
Admission: RE | Admit: 2017-07-19 | Discharge: 2017-07-19 | Disposition: A | Discharge status: Home / Self Care | Payer: Medicare HMO | Source: Ambulatory Visit | Attending: Interventional Cardiology | Admitting: Interventional Cardiology

## 2017-07-19 DIAGNOSIS — I5032 Chronic diastolic (congestive) heart failure: Secondary | ICD-10-CM | POA: Insufficient documentation

## 2017-07-19 DIAGNOSIS — E876 Hypokalemia: Secondary | ICD-10-CM | POA: Insufficient documentation

## 2017-07-19 DIAGNOSIS — I5033 Acute on chronic diastolic (congestive) heart failure: Secondary | ICD-10-CM | POA: Diagnosis not present

## 2017-07-19 DIAGNOSIS — E782 Mixed hyperlipidemia: Secondary | ICD-10-CM | POA: Diagnosis not present

## 2017-07-19 LAB — BASIC METABOLIC PANEL
Anion gap: 6 (ref 5–15)
BUN: 30 mg/dL — ABNORMAL HIGH (ref 6–20)
CALCIUM: 9.2 mg/dL (ref 8.9–10.3)
CO2: 29 mmol/L (ref 22–32)
Chloride: 100 mmol/L — ABNORMAL LOW (ref 101–111)
Creatinine, Ser: 1.28 mg/dL — ABNORMAL HIGH (ref 0.44–1.00)
GFR calc Af Amer: 47 mL/min — ABNORMAL LOW (ref >60)
GFR, EST NON AFRICAN AMERICAN: 40 mL/min — AB (ref >60)
GLUCOSE: 157 mg/dL — AB (ref 65–99)
POTASSIUM: 4.7 mmol/L (ref 3.5–5.1)
SODIUM: 135 mmol/L (ref 135–145)

## 2017-07-19 LAB — LIPID PANEL
CHOLESTEROL: 168 mg/dL (ref 0–200)
HDL: 40 mg/dL — ABNORMAL LOW (ref >40)
LDL CALC: 105 mg/dL — AB (ref 0–99)
TRIGLYCERIDES: 113 mg/dL (ref <150)
Total CHOL/HDL Ratio: 4.2 RATIO
VLDL: 23 mg/dL (ref 0–40)

## 2017-07-20 ENCOUNTER — Other Ambulatory Visit: Payer: Self-pay | Admitting: Family Medicine

## 2017-07-20 DIAGNOSIS — F329 Major depressive disorder, single episode, unspecified: Secondary | ICD-10-CM

## 2017-07-20 DIAGNOSIS — F419 Anxiety disorder, unspecified: Secondary | ICD-10-CM

## 2017-07-20 DIAGNOSIS — F32A Depression, unspecified: Secondary | ICD-10-CM

## 2017-07-21 ENCOUNTER — Telehealth: Payer: Self-pay | Admitting: Interventional Cardiology

## 2017-07-21 MED ORDER — FUROSEMIDE 40 MG PO TABS: 80.0000 mg | ORAL_TABLET | Freq: Every day | ORAL | 1 refills | Status: DC

## 2017-07-21 MED ORDER — METOPROLOL SUCCINATE ER 50 MG PO TB24: 50.0000 mg | ORAL_TABLET | Freq: Two times a day (BID) | ORAL | 3 refills | Status: DC

## 2017-07-21 MED ORDER — POTASSIUM CHLORIDE CRYS ER 20 MEQ PO TBCR: 40.0000 meq | EXTENDED_RELEASE_TABLET | Freq: Every day | ORAL | 1 refills | Status: DC

## 2017-07-21 NOTE — Telephone Encounter (Signed)
Spoke to patient. Patient takes both:  Metoprolol tartrate 100 mg BID and Metoprolol Succinate 50 mg BID  The patient has been taking it like this for a long time and Dr. Rayann Heman has approved this (see 07/21/16 phone note). No changes have been made since then. The patient is doing well on current regimen. Both Rx sent to preferred pharmacy.

## 2017-07-21 NOTE — Telephone Encounter (Signed)
Pt is calling and stating she is returning call to nurse. Please call pt.

## 2017-07-21 NOTE — Telephone Encounter (Signed)
Called and left a message for patient to call back. Need to clarify metoprolol tartrate/metoprolol succinate Rxs.

## 2017-07-21 NOTE — Telephone Encounter (Signed)
Follow up ° °Patient is returning call in reference to labs. Please call.   °

## 2017-07-21 NOTE — Telephone Encounter (Signed)
Called patient and made her aware of her lab results.   Patient currently takes lasix 40 mg tablet: 2 tablets (80 mg total) QD. Instructed patient to alternate her lasix doses daily 80 mg and 40 mg. She should take 2 tablets (80 mg total) one day and 1 tablet (40 mg total) the next day.   Patient currently taking potassium 20 mEq tablet: 2 tablets (40 mEq total) QD. Instructed patient to alternate her potassium as well with her lasix. Instructed the patient to take 2 tablets of potassium (40 mEq total) on the days that she takes 2 tablets of lasix (80 mg total) and to take 1 tablet of potassium (20 mEq total) on the days that she takes 1 tablet (40 mg total) of lasix.   Patient verbalized understanding and is able to repeat the information back to me. Patient has an appointment with Ermalinda Barrios, PA on 5/13. Will recheck BMET at that time.

## 2017-07-21 NOTE — Telephone Encounter (Signed)
-----   Message from Jettie Booze, MD sent at 07/20/2017  9:54 PM EDT ----- Lipids controlled. Cr slightly increased.  Can try to alternate 80 mg and 40 mg Lasix doses daily and repeat BMet in 2 weeks.

## 2017-07-26 ENCOUNTER — Other Ambulatory Visit: Payer: Self-pay | Admitting: Interventional Cardiology

## 2017-08-01 ENCOUNTER — Ambulatory Visit: Payer: Medicare HMO | Admitting: Physician Assistant

## 2017-08-01 ENCOUNTER — Encounter: Payer: Self-pay | Admitting: Physician Assistant

## 2017-08-01 VITALS — BP 130/72 | HR 75 | Ht 60.0 in | Wt 212.0 lb

## 2017-08-01 DIAGNOSIS — E782 Mixed hyperlipidemia: Secondary | ICD-10-CM | POA: Diagnosis not present

## 2017-08-01 DIAGNOSIS — I110 Hypertensive heart disease with heart failure: Secondary | ICD-10-CM | POA: Diagnosis not present

## 2017-08-01 DIAGNOSIS — I481 Persistent atrial fibrillation: Secondary | ICD-10-CM | POA: Diagnosis not present

## 2017-08-01 DIAGNOSIS — I5030 Unspecified diastolic (congestive) heart failure: Secondary | ICD-10-CM

## 2017-08-01 DIAGNOSIS — G4733 Obstructive sleep apnea (adult) (pediatric): Secondary | ICD-10-CM

## 2017-08-01 DIAGNOSIS — I5032 Chronic diastolic (congestive) heart failure: Secondary | ICD-10-CM | POA: Diagnosis not present

## 2017-08-01 DIAGNOSIS — I4819 Other persistent atrial fibrillation: Secondary | ICD-10-CM

## 2017-08-01 NOTE — Progress Notes (Signed)
Cardiology Office Note    Date:  08/01/2017   ID:  Holly, Smith Aug 06, 1945, MRN [id number]  PCP:  Juline Patch, MD  Cardiologist: Larae Grooms, MD  Chief Complaint  Patient presents with  . Follow-up    History of Present Illness:  Holly Smith is a 72 y.o. female history of chronic diastolic CHF, persistent atrial fibrillation on Xarelto, previous tachycardia mediated cardiomyopathy in 2015 cardioverted [facility], hypertension, HLD, morbid obesity.  Saw Dr. Irish Lack 06/24/2017 and had acute on chronic diastolic CHF.  Lasix was increased to 80 mg daily.  Creatinine was rechecked and went from 0.88-1.28 so she was instructed to take Lasix 80 mg alternating with 40 every other day.  Fasting lipid panel LDL of 105 triglycerides 113 total cholesterol 168.  Last 2D echo 2016 normal LVEF 55 to 60%.  Patient comes in today for follow-up.  She said initially she got her weight down to 206 pounds but then she started eaten chips and pork grinds and weights up to 212 pounds.  Went over her lipid panel.  She does not want to take med medication.  She is willing to try the Atkins diet.  Chronic dyspnea on exertion but overall is feeling better.    Past Medical History:  Diagnosis Date  . A-fib ([place])   . Abnormal Pap smear of vagina and vaginal HPV   . Arthritis    "bones ache" (01/23/2015)  . Cancer ([place])    vulvar cancer  . CHF (congestive heart failure) ([place])   . Chronic anticoagulation    She is on Xarelto for Afib  . Chronic atrial fibrillation ([place])   . Dyspnea   . Echocardiogram abnormal    Mitral regurgitation, tricuspic regurgitation  . Family history of adverse reaction to anesthesia    sister gets very nauseated and vomits  . GERD (gastroesophageal reflux disease)   . History of kidney stones   . History of stress test    Myoview 8/16: EF 42%, anteroseptal, inferoseptal, anterior, apical anterior and apical defect (likely represents breast attenuation  versus scar), no ischemia; intermediate risk because of low EF  . HPV (human papilloma virus) anogenital infection    03/21/12 PAP + HR HPV  . Hypertension   . Insulin resistance   . Kidney stone   . Kidney stones   . Obesity   . OSA on CPAP   . Postoperative nausea and vomiting 1983   many years ago with hysterectomy  . Urolithiasis   . Vertigo   . Vulvar cancer ([place]) 2016   invasive verrucous carcinoma of the vulva. History of genital warts and vulvar condyloma    Past Surgical History:  Procedure Laterality Date  . ABDOMINAL HYSTERECTOMY    . APPENDECTOMY    . BILATERAL SALPINGOOPHORECTOMY  2003   benign ovarian cancer  . CARDIOVERSION  2014-2015   "[place]"  . COLONOSCOPY WITH PROPOFOL N/A 06/10/2015   Procedure: COLONOSCOPY WITH PROPOFOL;  Surgeon: Lucilla Lame, MD;  Location: [facility] ENDOSCOPY;  Service: Endoscopy;  Laterality: N/A;  . CYSTOSCOPY W/ RETROGRADES Bilateral 10/19/2016   Procedure: CYSTOSCOPY WITH RETROGRADE PYELOGRAM;  Surgeon: Hollice Espy, MD;  Location: [facility] ORS;  Service: Urology;  Laterality: Bilateral;  . CYSTOSCOPY W/ URETERAL STENT PLACEMENT Left 12/29/2016   Procedure: CYSTOSCOPY WITH STENT REPLACEMENT;  Surgeon: Hollice Espy, MD;  Location: [facility] ORS;  Service: Urology;  Laterality: Left;  . CYSTOSCOPY WITH BIOPSY N/A 10/19/2016   Procedure: CYSTOSCOPY WITH BLADDER BIOPSY, VAGINAL  WALL BIOPSY;  Surgeon: Hollice Espy, MD;  Location: [facility] ORS;  Service: Urology;  Laterality: N/A;  . CYSTOSCOPY WITH STENT PLACEMENT Left 12/14/2016   Procedure: CYSTOSCOPY WITH STENT PLACEMENT;  Surgeon: Hollice Espy, MD;  Location: [facility] ORS;  Service: Urology;  Laterality: Left;  . ESOPHAGOGASTRODUODENOSCOPY (EGD) WITH PROPOFOL N/A 10/05/2016   Surgeon: Lucilla Lame, MD;  Location: [facility] ENDOSCOPY;  Results: Barrett's Esophagus- repeat in 3 years 09/2019  . LITHOTRIPSY    . STONE EXTRACTION WITH BASKET Left 12/29/2016   Procedure: STONE EXTRACTION WITH  BASKET;  Surgeon: Hollice Espy, MD;  Location: [facility] ORS;  Service: Urology;  Laterality: Left;  . TONSILLECTOMY    . URETEROSCOPY Left 12/29/2016   Procedure: URETEROSCOPY;  Surgeon: Hollice Espy, MD;  Location: [facility] ORS;  Service: Urology;  Laterality: Left;  Marland Kitchen VULVECTOMY Right 05/10/2014   Excisional biopsy of the superior right labial majora mass; [place] PARTIAL  07/02/2014   Re-excision and sentinel node dissection at [facility].     Current Medications: Current Meds  Medication Sig  . acetaminophen (TYLENOL) 325 MG tablet Take 2 tablets (650 mg total) by mouth every 6 (six) hours as needed for mild pain (or Fever >/= 101).  Marland Kitchen albuterol (PROVENTIL HFA;VENTOLIN HFA) 108 (90 Base) MCG/ACT inhaler Inhale 2 puffs into the lungs every 8 (eight) hours as needed for wheezing or shortness of breath.  . furosemide (LASIX) 40 MG tablet TAKE 2 TABLETS (80 MG TOTAL) BY MOUTH DAILY.  Marland Kitchen irbesartan (AVAPRO) 75 MG tablet TAKE 1 TABLET BY MOUTH DAILY. PATIENT NEEDS AN APPOINTMENT FOR FURTHER REFILLS 3RD/FINAL ATTEMPT  . metoprolol succinate (TOPROL-XL) 50 MG 24 hr tablet Take 1 tablet (50 mg total) by mouth 2 (two) times daily.  . metoprolol tartrate (LOPRESSOR) 100 MG tablet TAKE 1 TABLET BY MOUTH TWICE A DAY  . omeprazole (PRILOSEC) 20 MG capsule Take 20 mg by mouth daily.  . potassium chloride SA (K-DUR,KLOR-CON) 20 MEQ tablet Take 2 tablets (40 mEq total) by mouth daily. Every other day only take 1 tablet (20 mEq total) by mouth daily  . sertraline (ZOLOFT) 25 MG tablet TAKE 1 TABLET BY MOUTH EVERY DAY. *NEEDS APPT FOR FURTHER FILLS*  . triamcinolone (NASACORT ALLERGY 24HR) 55 MCG/ACT AERO nasal inhaler Place 2 sprays into the nose daily as needed.   . vitamin B-12 (CYANOCOBALAMIN) 1000 MCG tablet Take 1,000 mcg by mouth every other day.   Alveda Reasons 20 MG TABS tablet TAKE 1 TABLET (20 MG TOTAL) BY MOUTH DAILY WITH SUPPER.   Current Facility-Administered Medications for the  08/01/17 encounter (Office Visit) with Imogene Burn, PA-C  Medication  . albuterol (PROVENTIL) (2.5 MG/3ML) 0.083% nebulizer solution 2.5 mg     Allergies:   Ciprofloxacin   Social History   Socioeconomic History  . Marital status: Divorced    Spouse name: Not on file  . Number of children: 1  . Years of education: GED  . Highest education level: 12th grade  Occupational History  . Occupation: Retired  Scientific laboratory technician  . Financial resource strain: Not hard at all  . Food insecurity:    Worry: Never true    Inability: Never true  . Transportation needs:    Medical: No    Non-medical: No  Tobacco Use  . Smoking status: Former Smoker    Packs/day: 1.00    Years: 50.00    Pack years: 50.00    Types: Cigarettes    Last attempt to quit: 05/20/2012  Years since quitting: 5.2  . Smokeless tobacco: Never Used  . Tobacco comment: smoking cessation materials not required  Substance and Sexual Activity  . Alcohol use: No    Alcohol/week: 0.0 oz  . Drug use: No  . Sexual activity: Not Currently  Lifestyle  . Physical activity:    Days per week: 2 days    Minutes per session: 60 min  . Stress: Not at all  Relationships  . Social connections:    Talks on phone: More than three times a week    Gets together: Once a week    Attends religious service: 1 to 4 times per year    Active member of club or organization: No    Attends meetings of clubs or organizations: Never    Relationship status: Divorced  Other Topics Concern  . Not on file  Social History Narrative  . Not on file     Family History:  The patient's family history includes Diabetes in her mother; Heart attack in her brother and father; Heart disease in her sister; Hypertension in her mother; Pancreatic cancer in her brother; Prostate cancer (age of onset: 81) in her brother; Stroke in her mother.   ROS:   Please see the history of present illness.    Review of Systems  Constitution: Positive for weight gain.   HENT: Negative.   Eyes: Negative.   Cardiovascular: Positive for dyspnea on exertion.  Respiratory: Negative.   Hematologic/Lymphatic: Negative.   Musculoskeletal: Negative.  Negative for joint pain.  Gastrointestinal: Negative.   Genitourinary: Negative.   Neurological: Negative.    All other systems reviewed and are negative.   PHYSICAL EXAM:   VS:  BP 130/72   Pulse 75   Ht 5' (1.524 m)   Wt 212 lb (96.2 kg)   SpO2 97%   BMI 41.40 kg/m   Physical Exam  GEN: Obese, in no acute distress  Neck: no JVD, carotid bruits, or masses Cardiac:RRR; no murmurs, rubs, or gallops  Respiratory:  clear to auscultation bilaterally, normal work of breathing GI: soft, nontender, nondistended, + BS Ext: without cyanosis, clubbing, or edema, Good distal pulses bilaterally Neuro:  Alert and Oriented x 3 Psych: euthymic mood, full affect  Wt Readings from Last 3 Encounters:  08/01/17 212 lb (96.2 kg)  06/24/17 216 lb (98 kg)  04/13/17 215 lb (97.5 kg)      Studies/Labs Reviewed:   EKG:  EKG is not ordered today.  Recent Labs: 09/07/2016: TSH 2.050 12/13/2016: B Natriuretic Peptide 155.0 03/11/2017: ALT 16; Hemoglobin 13.3; Platelets 214 03/12/2017: Magnesium 1.8 07/19/2017: BUN 30; Creatinine, Ser 1.28; Potassium 4.7; Sodium 135   Lipid Panel    Component Value Date/Time   CHOL 168 07/19/2017 0949   CHOL 165 03/18/2016 1056   TRIG 113 07/19/2017 0949   HDL 40 (L) 07/19/2017 0949   HDL 42 03/18/2016 1056   CHOLHDL 4.2 07/19/2017 0949   VLDL 23 07/19/2017 0949   LDLCALC 105 (H) 07/19/2017 0949   LDLCALC 89 03/18/2016 1056    Additional studies/ records that were reviewed today include:  2D echo 7/2016Study Conclusions   - Left ventricle: The cavity size was normal. Wall thickness was   normal. Systolic function was normal. The estimated ejection   fraction was in the range of 55% to 60%. - Mitral valve: Calcified annulus. Mildly thickened leaflets .   There was mild  regurgitation. - Left atrium: The atrium was mildly dilated. - Atrial septum: No defect  or patent foramen ovale was identified.   [facility] chest 11/2016 [facility]  IMPRESSION: 1. No evidence of pulmonary embolism.  No acute findings. 2. Mild cardiomegaly coronary artery calcifications. 3. Aortic atherosclerosis.   Aortic Atherosclerosis (ICD10-I70.0).     Electronically Signed   By: Lajean Manes M.D.   On: 12/15/2016 13:47     ASSESSMENT:    1. Chronic diastolic heart failure ([place])   2. Atrial fibrillation, persistent ([place])   3. Hypertensive heart disease with diastolic congestive heart failure, unspecified HF chronicity ([place])   4. OSA (obstructive sleep apnea)   5. Morbid obesity ([place])   6. Mixed hyperlipidemia      PLAN:  In order of problems listed above:  Chronic diastolic CHF with recent exacerbation Lasix increased by Dr. Irish Lack to 80 mg daily then decrease to 80/40 alternating because of renal insufficiency-creatinine went from 0.88-1.28.  Check renal function today.  Overall seems compensated.  Discussed importance of 2 g sodium diet.  Follow-up with Dr. Irish Lack in 2 to 3 months.  Persistent atrial fibrillation maintained on Xarelto and metoprolol 50 mg twice daily   Hypertensive heart disease blood pressure controlled  OSA on CPAP  Morbid obesity weight loss program discussed with patient in detail.  She is lost weight on the Atkins diet before and said she is willing to try this.  Mixed hyperlipidemia labs 07/19/2017 triglycerides 113 LDL 105 cholesterol 168 discussed statin but patient wants to try to lose some weight first.  This is reasonable.  Medication Adjustments/Labs and Tests Ordered: Current medicines are reviewed at length with the patient today.  Concerns regarding medicines are outlined above.  Medication changes, Labs and Tests ordered today are listed in the Patient Instructions below. Patient Instructions  Medication Instructions:  Your physician  recommends that you continue on your current medications as directed. Please refer to the Current Medication list given to you today.  Labwork: NONE  Testing/Procedures: NONE  Follow-Up: Your physician wants you to follow-up in: 6 months with Dr. Radford Pax. You will receive a reminder letter in the mail two months in advance. If you don't receive a letter, please call our office to schedule the follow-up appointment.   If you need a refill on your cardiac medications before your next appointment, please call your pharmacy.       Signed, Ermalinda Barrios, PA-C  08/01/2017 1:32 PM    [facility] [address] [place], [place], [place]  [address] Phone: [phone]; Fax: [phone]

## 2017-08-01 NOTE — Patient Instructions (Addendum)
Medication Instructions:  Your physician recommends that you continue on your current medications as directed. Please refer to the Current Medication list given to you today.  Labwork: Your physician recommends that you have lab work today- BMET.   Testing/Procedures: NONE  Follow-Up: Your physician wants you to follow-up in: 3 months with Dr. Irish Lack.   Your physician wants you to start a weight loss program and diet, please see information below.  If you need a refill on your cardiac medications before your next appointment, please call your pharmacy.    Low-Sodium Eating Plan Sodium, which is an element that makes up salt, helps you maintain a healthy balance of fluids in your body. Too much sodium can increase your blood pressure and cause fluid and waste to be held in your body. Your health care provider or dietitian may recommend following this plan if you have high blood pressure (hypertension), kidney disease, liver disease, or heart failure. Eating less sodium can help lower your blood pressure, reduce swelling, and protect your heart, liver, and kidneys. What are tips for following this plan? General guidelines  Most people on this plan should limit their sodium intake to 1,500-2,000 mg (milligrams) of sodium each day. Reading food labels  The Nutrition Facts label lists the amount of sodium in one serving of the food. If you eat more than one serving, you must multiply the listed amount of sodium by the number of servings.  Choose foods with less than 140 mg of sodium per serving.  Avoid foods with 300 mg of sodium or more per serving. Shopping  Look for lower-sodium products, often labeled as "low-sodium" or "no salt added."  Always check the sodium content even if foods are labeled as "unsalted" or "no salt added".  Buy fresh foods. ? Avoid canned foods and premade or frozen meals. ? Avoid canned, cured, or processed meats  Buy breads that have less than 80 mg  of sodium per slice. Cooking  Eat more home-cooked food and less restaurant, buffet, and fast food.  Avoid adding salt when cooking. Use salt-free seasonings or herbs instead of table salt or sea salt. Check with your health care provider or pharmacist before using salt substitutes.  Cook with plant-based oils, such as canola, sunflower, or olive oil. Meal planning  When eating at a restaurant, ask that your food be prepared with less salt or no salt, if possible.  Avoid foods that contain MSG (monosodium glutamate). MSG is sometimes added to [place] food, bouillon, and some canned foods. What foods are recommended? The items listed may not be a complete list. Talk with your dietitian about what dietary choices are best for you. Grains Low-sodium cereals, including oats, puffed wheat and rice, and shredded wheat. Low-sodium crackers. Unsalted rice. Unsalted pasta. Low-sodium bread. Whole-grain breads and whole-grain pasta. Vegetables Fresh or frozen vegetables. "No salt added" canned vegetables. "No salt added" tomato sauce and paste. Low-sodium or reduced-sodium tomato and vegetable juice. Fruits Fresh, frozen, or canned fruit. Fruit juice. Meats and other protein foods Fresh or frozen (no salt added) meat, poultry, seafood, and fish. Low-sodium canned tuna and salmon. Unsalted nuts. Dried peas, beans, and lentils without added salt. Unsalted canned beans. Eggs. Unsalted nut butters. Dairy Milk. Soy milk. Cheese that is naturally low in sodium, such as ricotta cheese, fresh mozzarella, or Swiss cheese Low-sodium or reduced-sodium cheese. Cream cheese. Yogurt. Fats and oils Unsalted butter. Unsalted margarine with no trans fat. Vegetable oils such as canola or olive oils. Seasonings  and other foods Fresh and dried herbs and spices. Salt-free seasonings. Low-sodium mustard and ketchup. Sodium-free salad dressing. Sodium-free light mayonnaise. Fresh or refrigerated horseradish. Lemon juice.  Vinegar. Homemade, reduced-sodium, or low-sodium soups. Unsalted popcorn and pretzels. Low-salt or salt-free chips. What foods are not recommended? The items listed may not be a complete list. Talk with your dietitian about what dietary choices are best for you. Grains Instant hot cereals. Bread stuffing, pancake, and biscuit mixes. Croutons. Seasoned rice or pasta mixes. Noodle soup cups. Boxed or frozen macaroni and cheese. Regular salted crackers. Self-rising flour. Vegetables Sauerkraut, pickled vegetables, and relishes. Olives. [place] fries. Onion rings. Regular canned vegetables (not low-sodium or reduced-sodium). Regular canned tomato sauce and paste (not low-sodium or reduced-sodium). Regular tomato and vegetable juice (not low-sodium or reduced-sodium). Frozen vegetables in sauces. Meats and other protein foods Meat or fish that is salted, canned, smoked, spiced, or pickled. Bacon, ham, sausage, hotdogs, corned beef, chipped beef, packaged lunch meats, salt pork, jerky, pickled herring, anchovies, regular canned tuna, sardines, salted nuts. Dairy Processed cheese and cheese spreads. Cheese curds. Blue cheese. Feta cheese. String cheese. Regular cottage cheese. Buttermilk. Canned milk. Fats and oils Salted butter. Regular margarine. Ghee. Bacon fat. Seasonings and other foods Onion salt, garlic salt, seasoned salt, table salt, and sea salt. Canned and packaged gravies. Worcestershire sauce. Tartar sauce. Barbecue sauce. Teriyaki sauce. Soy sauce, including reduced-sodium. Steak sauce. Fish sauce. Oyster sauce. Cocktail sauce. Horseradish that you find on the shelf. Regular ketchup and mustard. Meat flavorings and tenderizers. Bouillon cubes. Hot sauce and Tabasco sauce. Premade or packaged marinades. Premade or packaged taco seasonings. Relishes. Regular salad dressings. Salsa. Potato and tortilla chips. Corn chips and puffs. Salted popcorn and pretzels. Canned or dried soups. Pizza. Frozen  entrees and pot pies. Summary  Eating less sodium can help lower your blood pressure, reduce swelling, and protect your heart, liver, and kidneys.  Most people on this plan should limit their sodium intake to 1,500-2,000 mg (milligrams) of sodium each day.  Canned, boxed, and frozen foods are high in sodium. Restaurant foods, fast foods, and pizza are also very high in sodium. You also get sodium by adding salt to food.  Try to cook at home, eat more fresh fruits and vegetables, and eat less fast food, canned, processed, or prepared foods. This information is not intended to replace advice given to you by your health care provider. Make sure you discuss any questions you have with your health care provider. Document Released: 08/28/2001 Document Revised: 03/01/2016 Document Reviewed: 03/01/2016 Elsevier Interactive Patient Education  2018 [place] [employer] Calories are units of energy. Your body needs a certain amount of calories from food to keep you going throughout the day. When you eat more calories than your body needs, your body stores the extra calories as fat. When you eat fewer calories than your body needs, your body burns fat to get the energy it needs. Calorie counting means keeping track of how many calories you eat and drink each day. Calorie counting can be helpful if you need to lose weight. If you make sure to eat fewer calories than your body needs, you should lose weight. Ask your health care provider what a healthy weight is for you. For calorie counting to work, you will need to eat the right number of calories in a day in order to lose a healthy amount of weight per week. A dietitian can help you determine how many calories  you need in a day and will give you suggestions on how to reach your calorie goal.  A healthy amount of weight to lose per week is usually 1-2 lb (0.5-0.9 kg). This usually means that your daily calorie intake should be  reduced by 500-750 calories.  Eating 1,200 - 1,500 calories per day can help most women lose weight.  Eating 1,500 - 1,800 calories per day can help most men lose weight.  What is my plan? My goal is to have __________ calories per day. If I have this many calories per day, I should lose around __________ pounds per week. What do I need to know about calorie counting? In order to meet your daily calorie goal, you will need to:  Find out how many calories are in each food you would like to eat. Try to do this before you eat.  Decide how much of the food you plan to eat.  Write down what you ate and how many calories it had. Doing this is called keeping a food log.  To successfully lose weight, it is important to balance calorie counting with a healthy lifestyle that includes regular activity. Aim for 150 minutes of moderate exercise (such as walking) or 75 minutes of vigorous exercise (such as running) each week. Where do I find calorie information?  The number of calories in a food can be found on a Nutrition Facts label. If a food does not have a Nutrition Facts label, try to look up the calories online or ask your dietitian for help. Remember that calories are listed per serving. If you choose to have more than one serving of a food, you will have to multiply the calories per serving by the amount of servings you plan to eat. For example, the label on a package of bread might say that a serving size is 1 slice and that there are 90 calories in a serving. If you eat 1 slice, you will have eaten 90 calories. If you eat 2 slices, you will have eaten 180 calories. How do I keep a food log? Immediately after each meal, record the following information in your food log:  What you ate. Don't forget to include toppings, sauces, and other extras on the food.  How much you ate. This can be measured in cups, ounces, or number of items.  How many calories each food and drink had.  The total  number of calories in the meal.  Keep your food log near you, such as in a small notebook in your pocket, or use a mobile app or website. Some programs will calculate calories for you and show you how many calories you have left for the day to meet your goal. What are some calorie counting tips?  Use your calories on foods and drinks that will fill you up and not leave you hungry: ? Some examples of foods that fill you up are nuts and nut butters, vegetables, lean proteins, and high-fiber foods like whole grains. High-fiber foods are foods with more than 5 g fiber per serving. ? Drinks such as sodas, specialty coffee drinks, alcohol, and juices have a lot of calories, yet do not fill you up.  Eat nutritious foods and avoid empty calories. Empty calories are calories you get from foods or beverages that do not have many vitamins or protein, such as candy, sweets, and soda. It is better to have a nutritious high-calorie food (such as an avocado) than a food with few nutrients (  such as a bag of chips).  Know how many calories are in the foods you eat most often. This will help you calculate calorie counts faster.  Pay attention to calories in drinks. Low-calorie drinks include water and unsweetened drinks.  Pay attention to nutrition labels for "low fat" or "fat free" foods. These foods sometimes have the same amount of calories or more calories than the full fat versions. They also often have added sugar, starch, or salt, to make up for flavor that was removed with the fat.  Find a way of tracking calories that works for you. Get creative. Try different apps or programs if writing down calories does not work for you. What are some portion control tips?  Know how many calories are in a serving. This will help you know how many servings of a certain food you can have.  Use a measuring cup to measure serving sizes. You could also try weighing out portions on a kitchen scale. With time, you will be  able to estimate serving sizes for some foods.  Take some time to put servings of different foods on your favorite plates, bowls, and cups so you know what a serving looks like.  Try not to eat straight from a bag or box. Doing this can lead to overeating. Put the amount you would like to eat in a cup or on a plate to make sure you are eating the right portion.  Use smaller plates, glasses, and bowls to prevent overeating.  Try not to multitask (for example, watch TV or use your computer) while eating. If it is time to eat, sit down at a table and enjoy your food. This will help you to know when you are full. It will also help you to be aware of what you are eating and how much you are eating. What are tips for following this plan? Reading food labels  Check the calorie count compared to the serving size. The serving size may be smaller than what you are used to eating.  Check the source of the calories. Make sure the food you are eating is high in vitamins and protein and low in saturated and trans fats. Shopping  Read nutrition labels while you shop. This will help you make healthy decisions before you decide to purchase your food.  Make a grocery list and stick to it. Cooking  Try to cook your favorite foods in a healthier way. For example, try baking instead of frying.  Use low-fat dairy products. Meal planning  Use more fruits and vegetables. Half of your plate should be fruits and vegetables.  Include lean proteins like poultry and fish. How do I count calories when eating out?  Ask for smaller portion sizes.  Consider sharing an entree and sides instead of getting your own entree.  If you get your own entree, eat only half. Ask for a box at the beginning of your meal and put the rest of your entree in it so you are not tempted to eat it.  If calories are listed on the menu, choose the lower calorie options.  Choose dishes that include vegetables, fruits, whole grains,  low-fat dairy products, and lean protein.  Choose items that are boiled, broiled, grilled, or steamed. Stay away from items that are buttered, battered, fried, or served with cream sauce. Items labeled "crispy" are usually fried, unless stated otherwise.  Choose water, low-fat milk, unsweetened iced tea, or other drinks without added sugar. If you want  an alcoholic beverage, choose a lower calorie option such as a glass of wine or light beer.  Ask for dressings, sauces, and syrups on the side. These are usually high in calories, so you should limit the amount you eat.  If you want a salad, choose a garden salad and ask for grilled meats. Avoid extra toppings like bacon, cheese, or fried items. Ask for the dressing on the side, or ask for olive oil and vinegar or lemon to use as dressing.  Estimate how many servings of a food you are given. For example, a serving of cooked rice is  cup or about the size of half a baseball. Knowing serving sizes will help you be aware of how much food you are eating at restaurants. The list below tells you how big or small some common portion sizes are based on everyday objects: ? 1 oz-4 stacked dice. ? 3 oz-1 deck of cards. ? 1 tsp-1 die. ? 1 Tbsp- a ping-pong ball. ? 2 Tbsp-1 ping-pong ball. ?  cup- baseball. ? 1 cup-1 baseball. Summary  Calorie counting means keeping track of how many calories you eat and drink each day. If you eat fewer calories than your body needs, you should lose weight.  A healthy amount of weight to lose per week is usually 1-2 lb (0.5-0.9 kg). This usually means reducing your daily calorie intake by 500-750 calories.  The number of calories in a food can be found on a Nutrition Facts label. If a food does not have a Nutrition Facts label, try to look up the calories online or ask your dietitian for help.  Use your calories on foods and drinks that will fill you up, and not on foods and drinks that will leave you hungry.  Use  smaller plates, glasses, and bowls to prevent overeating. This information is not intended to replace advice given to you by your health care provider. Make sure you discuss any questions you have with your health care provider. Document Released: 03/08/2005 Document Revised: 02/06/2016 Document Reviewed: 02/06/2016 Elsevier Interactive Patient Education  [employer].

## 2017-08-02 ENCOUNTER — Telehealth: Payer: Self-pay | Admitting: *Deleted

## 2017-08-02 DIAGNOSIS — Z79899 Other long term (current) drug therapy: Secondary | ICD-10-CM

## 2017-08-02 LAB — BASIC METABOLIC PANEL
BUN/Creatinine Ratio: 22 (ref 12–28)
BUN: 22 mg/dL (ref 8–27)
CALCIUM: 9.4 mg/dL (ref 8.7–10.3)
CO2: 24 mmol/L (ref 20–29)
CREATININE: 1.01 mg/dL — AB (ref 0.57–1.00)
Chloride: 105 mmol/L (ref 96–106)
GFR calc non Af Amer: 55 mL/min/{1.73_m2} — ABNORMAL LOW (ref >59)
GFR, EST AFRICAN AMERICAN: 64 mL/min/{1.73_m2} (ref >59)
Glucose: 95 mg/dL (ref 65–99)
Potassium: 5.6 mmol/L — ABNORMAL HIGH (ref 3.5–5.2)
Sodium: 142 mmol/L (ref 134–144)

## 2017-08-02 MED ORDER — POTASSIUM CHLORIDE CRYS ER 20 MEQ PO TBCR: 20.0000 meq | EXTENDED_RELEASE_TABLET | Freq: Every day | ORAL | 3 refills | Status: DC

## 2017-08-02 NOTE — Telephone Encounter (Signed)
-----   Message from Imogene Burn, PA-C sent at 08/02/2017  7:53 AM EDT ----- Kidney function better. Potassium high. Hold potassium today. Then decrease potassium to 1 tablet daily. Recheck bmet on friday

## 2017-08-04 ENCOUNTER — Telehealth: Payer: Self-pay | Admitting: Interventional Cardiology

## 2017-08-04 DIAGNOSIS — E875 Hyperkalemia: Secondary | ICD-10-CM

## 2017-08-04 NOTE — Telephone Encounter (Signed)
New message  Pt verbalzied that she is calling for RN  She do not want to have labs done on Friday   [address] she want them done at the [place] location   Please call pt

## 2017-08-04 NOTE — Telephone Encounter (Signed)
Returned call to patient earlier. Patient wanting to have her repeat BMET drawn [facility] as she has done in the past. Made patient aware that I would change the order. Patient states that she is going to have it drawn on Friday.

## 2017-08-05 ENCOUNTER — Other Ambulatory Visit
Admission: RE | Admit: 2017-08-05 | Discharge: 2017-08-05 | Disposition: A | Discharge status: Home / Self Care | Payer: Medicare HMO | Source: Ambulatory Visit | Attending: Interventional Cardiology | Admitting: Interventional Cardiology

## 2017-08-05 ENCOUNTER — Other Ambulatory Visit: Payer: Medicare HMO

## 2017-08-05 DIAGNOSIS — E875 Hyperkalemia: Secondary | ICD-10-CM

## 2017-08-05 LAB — BASIC METABOLIC PANEL
Anion gap: 12 (ref 5–15)
BUN: 25 mg/dL — AB (ref 6–20)
CO2: 26 mmol/L (ref 22–32)
CREATININE: 1.07 mg/dL — AB (ref 0.44–1.00)
Calcium: 9.4 mg/dL (ref 8.9–10.3)
Chloride: 100 mmol/L — ABNORMAL LOW (ref 101–111)
GFR calc Af Amer: 58 mL/min — ABNORMAL LOW (ref >60)
GFR calc non Af Amer: 50 mL/min — ABNORMAL LOW (ref >60)
GLUCOSE: 149 mg/dL — AB (ref 65–99)
POTASSIUM: 4.8 mmol/L (ref 3.5–5.1)
Sodium: 138 mmol/L (ref 135–145)

## 2017-08-08 ENCOUNTER — Telehealth: Payer: Self-pay

## 2017-08-08 NOTE — Telephone Encounter (Signed)
-----   Message from Imogene Burn, PA-C sent at 08/08/2017  7:44 AM EDT ----- Potassium normal.  Glucose up to 149.  Renal function stable.

## 2017-08-08 NOTE — Telephone Encounter (Signed)
Patient is aware and agreeable to lab results

## 2017-08-09 ENCOUNTER — Ambulatory Visit: Payer: Medicare HMO | Admitting: Urology

## 2017-08-09 ENCOUNTER — Encounter: Payer: Self-pay | Admitting: Urology

## 2017-08-09 ENCOUNTER — Other Ambulatory Visit: Payer: Self-pay

## 2017-08-09 ENCOUNTER — Ambulatory Visit
Admission: RE | Admit: 2017-08-09 | Discharge: 2017-08-09 | Disposition: A | Discharge status: Home / Self Care | Payer: Medicare HMO | Source: Ambulatory Visit | Attending: Urology | Admitting: Urology

## 2017-08-09 VITALS — BP 98/65 | HR 91 | Ht 60.0 in | Wt 214.6 lb

## 2017-08-09 DIAGNOSIS — Z87442 Personal history of urinary calculi: Secondary | ICD-10-CM | POA: Insufficient documentation

## 2017-08-09 DIAGNOSIS — N3281 Overactive bladder: Secondary | ICD-10-CM

## 2017-08-09 DIAGNOSIS — N200 Calculus of kidney: Secondary | ICD-10-CM | POA: Diagnosis not present

## 2017-08-09 NOTE — Progress Notes (Signed)
08/09/2017 9:16 AM   Holly Smith 01/09/1946 [id number]  Referring provider: Juline Patch, MD [address] [place], [place] [address]  Chief Complaint  Patient presents with  . Nephrolithiasis    HPI: 72 year old female with personal history of nephrolithiasis who returns today for follow-up with KUB.    She presented in 11/2016 with a septic obstructing stone.  She underwent definitive management of her stone in the form of ureteroscopy on 12/29/2016.  Follow-up renal ultrasound on 01/31/2017 shows no hydronephrosis bilaterally or stones in the kidneys.  Stone composition consistent with calcium oxalate dihydrate 5%, calcium oxalate monohydrate 30%, calcium phosphate 45%, magnesium ammonium phosphate 15%, and ammonium acid urate 5%.  Prior to this, she has a very remote history of kidney stones.  She has not had an episode for many years.  She has had lithotripsy in the past for stones.  She does also have a personal history of overactive bladder.  She was previously prescribed Myrbetriq 25 mg.  She reports most recently, her urinary symptoms have improved dramatically and she no longer needs this medication.  KUB today shows no new stone burden.  Overall, she is doing very well.  She denies any flank pain or gross hematuria.  She is been struggling with all other multiple medical conditions.  She also has a personal history of vulvar cancer. She was evaluated for urethral abnormality which was not appreciated.  Incidental bladder findings were noted biopsied, and negative for malignancy 09/2016.   PMH: Past Medical History:  Diagnosis Date  . A-fib ([place])   . Abnormal Pap smear of vagina and vaginal HPV   . Arthritis    "bones ache" (01/23/2015)  . Cancer ([place])    vulvar cancer  . CHF (congestive heart failure) ([place])   . Chronic anticoagulation    She is on Xarelto for Afib  . Chronic atrial fibrillation ([place])   . Dyspnea   . Echocardiogram  abnormal    Mitral regurgitation, tricuspic regurgitation  . Family history of adverse reaction to anesthesia    sister gets very nauseated and vomits  . GERD (gastroesophageal reflux disease)   . History of kidney stones   . History of stress test    Myoview 8/16: EF 42%, anteroseptal, inferoseptal, anterior, apical anterior and apical defect (likely represents breast attenuation versus scar), no ischemia; intermediate risk because of low EF  . HPV (human papilloma virus) anogenital infection    03/21/12 PAP + HR HPV  . Hypertension   . Insulin resistance   . Kidney stone   . Kidney stones   . Obesity   . OSA on CPAP   . Postoperative nausea and vomiting 1983   many years ago with hysterectomy  . Urolithiasis   . Vertigo   . Vulvar cancer ([place]) 2016   invasive verrucous carcinoma of the vulva. History of genital warts and vulvar condyloma    Surgical History: Past Surgical History:  Procedure Laterality Date  . ABDOMINAL HYSTERECTOMY    . APPENDECTOMY    . BILATERAL SALPINGOOPHORECTOMY  2003   benign ovarian cancer  . CARDIOVERSION  2014-2015   "[place]"  . COLONOSCOPY WITH PROPOFOL N/A 06/10/2015   Procedure: COLONOSCOPY WITH PROPOFOL;  Surgeon: Lucilla Lame, MD;  Location: [facility];  Service: Endoscopy;  Laterality: N/A;  . CYSTOSCOPY W/ RETROGRADES Bilateral 10/19/2016   Procedure: CYSTOSCOPY WITH RETROGRADE PYELOGRAM;  Surgeon: Hollice Espy, MD;  Location: [facility];  Service: Urology;  Laterality: Bilateral;  .  CYSTOSCOPY W/ URETERAL STENT PLACEMENT Left 12/29/2016   Procedure: CYSTOSCOPY WITH STENT REPLACEMENT;  Surgeon: Hollice Espy, MD;  Location: [facility] OR[facility];  Service: Urology;  Laterality: Left;  . CYSTOSCOPY WITH BIOPSY N/A 10/19/2016   Procedure: CYSTOSCOPY WITH BLADDER BIOPSY, VAGINAL WALL BIOPSY;  Surgeon: Hollice Espy, MD;  Location: [facility];  Service: Urology;  Laterality: N/A;  . CYSTOSCOPY WITH STENT PLACEMENT Left 12/14/2016   Procedure:  CYSTOSCOPY WITH STENT PLACEMENT;  Surgeon: Hollice Espy, MD;  Location: [facility];  Service: Urology;  Laterality: Left;  . ESOPHAGOGASTRODUODENOSCOPY (EGD) WITH PROPOFOL N/A 10/05/2016   Surgeon: Lucilla Lame, MD;  Location: [facility];  Results: Barrett's Esophagus- repeat in 3 years 09/2019  . LITHOTRIPSY    . STONE EXTRACTION WITH BASKET Left 12/29/2016   Procedure: STONE EXTRACTION WITH BASKET;  Surgeon: Hollice Espy, MD;  Location: [facility] OR[facility];  Service: Urology;  Laterality: Left;  . TONSILLECTOMY    . URETEROSCOPY Left 12/29/2016   Procedure: URETEROSCOPY;  Surgeon: Hollice Espy, MD;  Location: [facility];  Service: Urology;  Laterality: Left;  [facility] VULVECTOMY Right 05/10/2014   Excisional biopsy of the superior right labial majora mass; [place] PARTIAL  07/02/2014   Re-excision and sentinel node dissection at [facility].     Home Medications:  Allergies as of 08/09/2017      Reactions   Ciprofloxacin Other (See Comments)   SOB      Medication List        Accurate as of 08/09/17 11:59 PM. Always use your most recent med list.          acetaminophen 325 MG tablet Commonly known as:  TYLENOL Take 2 tablets (650 mg total) by mouth every 6 (six) hours as needed for mild pain (or Fever >/= 101).   albuterol 108 (90 Base) MCG/ACT inhaler Commonly known as:  PROVENTIL HFA;VENTOLIN HFA Inhale 2 puffs into the lungs every 8 (eight) hours as needed for wheezing or shortness of breath.   furosemide 40 MG tablet Commonly known as:  LASIX TAKE 2 TABLETS (80 MG TOTAL) BY MOUTH DAILY.   irbesartan 75 MG tablet Commonly known as:  AVAPRO TAKE 1 TABLET BY MOUTH DAILY. PATIENT NEEDS AN APPOINTMENT FOR FURTHER REFILLS 3RD/FINAL ATTEMPT   metoprolol succinate 50 MG 24 hr tablet Commonly known as:  TOPROL-XL Take 1 tablet (50 mg total) by mouth 2 (two) times daily.   metoprolol tartrate 100 MG tablet Commonly known as:  LOPRESSOR TAKE 1 TABLET BY MOUTH TWICE A  DAY   NASACORT ALLERGY 24HR 55 MCG/ACT Aero nasal inhaler Generic drug:  triamcinolone Place 2 sprays into the nose daily as needed.   omeprazole 20 MG capsule Commonly known as:  PRILOSEC Take 20 mg by mouth daily.   potassium chloride SA 20 MEQ tablet Commonly known as:  K-DUR,KLOR-CON Take 1 tablet (20 mEq total) by mouth daily.   sertraline 25 MG tablet Commonly known as:  ZOLOFT TAKE 1 TABLET BY MOUTH EVERY DAY. *NEEDS APPT FOR FURTHER FILLS*   vitamin B-12 1000 MCG tablet Commonly known as:  CYANOCOBALAMIN Take 1,000 mcg by mouth every other day.   XARELTO 20 MG Tabs tablet Generic drug:  rivaroxaban TAKE 1 TABLET (20 MG TOTAL) BY MOUTH DAILY WITH SUPPER.       Allergies:  Allergies  Allergen Reactions  . Ciprofloxacin Other (See Comments)    SOB    Family History: Family History  Problem Relation Age of Onset  . Prostate cancer  Brother 51       still living and well  . Heart attack Father   . Stroke Mother   . Diabetes Mother   . Hypertension Mother   . Heart attack Brother   . Pancreatic cancer Brother   . Heart disease Sister   . Breast cancer Neg Hx   . Kidney cancer Neg Hx   . Bladder Cancer Neg Hx     Social History:  reports that she quit smoking about 5 years ago. Her smoking use included cigarettes. She has a 50.00 pack-year smoking history. She has never used smokeless tobacco. She reports that she does not drink alcohol or use drugs.  ROS: UROLOGY Frequent Urination?: Yes Hard to postpone urination?: No Burning/pain with urination?: No Get up at night to urinate?: No Leakage of urine?: No Urine stream starts and stops?: No Trouble starting stream?: No Do you have to strain to urinate?: No Blood in urine?: No Urinary tract infection?: No Sexually transmitted disease?: No Injury to kidneys or bladder?: No Painful intercourse?: No Weak stream?: No Currently pregnant?: No Vaginal bleeding?: No Last menstrual period?:  n  Gastrointestinal Nausea?: No Vomiting?: No Indigestion/heartburn?: No Diarrhea?: Yes Constipation?: No  Constitutional Fever: No Night sweats?: No Weight loss?: No Fatigue?: Yes  Skin Skin rash/lesions?: No Itching?: No  Eyes Blurred vision?: Yes Double vision?: No  Ears/Nose/Throat Sore throat?: No Sinus problems?: Yes  Hematologic/Lymphatic Swollen glands?: No Easy bruising?: No  Cardiovascular Leg swelling?: Yes Chest pain?: No  Respiratory Cough?: No Shortness of breath?: Yes  Endocrine Excessive thirst?: No  Musculoskeletal Back pain?: Yes Joint pain?: Yes  Neurological Headaches?: No Dizziness?: No  Psychologic Depression?: No Anxiety?: No  Physical Exam: BP 98/65 (BP Location: Left Arm, Patient Position: Sitting, Cuff Size: Large)   Pulse 91   Ht 5' (1.524 m)   Wt 214 lb 9.6 oz (97.3 kg)   BMI 41.91 kg/m   Constitutional:  Alert and oriented, No acute distress. HEENT: [place] AT, moist mucus membranes.  Trachea midline, no masses. Cardiovascular: No clubbing, cyanosis, or edema. Respiratory: Normal respiratory effort, no increased work of breathing. GI: Abdomen obese GU: No CVA tenderness Skin: No rashes, bruises or suspicious lesions. Neurologic: Grossly intact, no focal deficits, moving all 4 extremities. Psychiatric: Normal mood and affect.  Laboratory Data: Lab Results  Component Value Date   WBC 8.0 03/11/2017   HGB 13.3 03/11/2017   HCT 40.4 03/11/2017   MCV 91.4 03/11/2017   PLT 214 03/11/2017    Lab Results  Component Value Date   CREATININE 1.07 (H) 08/05/2017    Lab Results  Component Value Date   HGBA1C 6.6 (H) 12/15/2016    Urinalysis N/a  Pertinent Imaging: CLINICAL DATA:  Nephrolithiasis, back pain  EXAM: ABDOMEN - 1 VIEW  COMPARISON:  04/27/2017  FINDINGS: There is no bowel dilatation to suggest obstruction. There is no evidence of pneumoperitoneum, portal venous gas or  pneumatosis.  There are no pathologic calcifications along the expected course of the ureters.  The osseous structures are unremarkable.  IMPRESSION: No urolithiasis.   Electronically Signed   By: Kathreen Devoid   On: 08/09/2017 15:32  KUB reviewed personally today with the patient.  Assessment & Plan:    1. History of kidney stones No evidence of new nephrolithiasis We reviewed stone diet again today Discussed signs and symptoms of kidney stones We will follow-up as needed  2. OAB (overactive bladder) Resolved No longer om Myrbetriq  F/u prn  Hollice Espy, MD  [facility] [address], [place], [place] [address] [phone]

## 2017-08-24 ENCOUNTER — Inpatient Hospital Stay: Payer: Medicare HMO | Attending: Obstetrics and Gynecology | Admitting: Obstetrics and Gynecology

## 2017-08-24 VITALS — BP 144/78 | HR 77 | Temp 97.1°F | Resp 18 | Ht 60.0 in | Wt 221.3 lb

## 2017-08-24 DIAGNOSIS — Z87891 Personal history of nicotine dependence: Secondary | ICD-10-CM | POA: Diagnosis not present

## 2017-08-24 DIAGNOSIS — A630 Anogenital (venereal) warts: Secondary | ICD-10-CM | POA: Insufficient documentation

## 2017-08-24 DIAGNOSIS — Z90722 Acquired absence of ovaries, bilateral: Secondary | ICD-10-CM | POA: Insufficient documentation

## 2017-08-24 DIAGNOSIS — R69 Illness, unspecified: Secondary | ICD-10-CM | POA: Diagnosis not present

## 2017-08-24 DIAGNOSIS — Z8544 Personal history of malignant neoplasm of other female genital organs: Secondary | ICD-10-CM | POA: Insufficient documentation

## 2017-08-24 DIAGNOSIS — Z90710 Acquired absence of both cervix and uterus: Secondary | ICD-10-CM | POA: Diagnosis not present

## 2017-08-24 DIAGNOSIS — C519 Malignant neoplasm of vulva, unspecified: Secondary | ICD-10-CM | POA: Diagnosis not present

## 2017-08-24 DIAGNOSIS — L439 Lichen planus, unspecified: Secondary | ICD-10-CM | POA: Diagnosis not present

## 2017-08-24 NOTE — Patient Instructions (Addendum)
Vulva Biopsy, Care After Refer to this sheet in the next few weeks. These instructions provide you with information about caring for yourself after your procedure. Your health care provider may also give you more specific instructions. Your treatment has been planned according to current medical practices, but problems sometimes occur. Call your health care provider if you have any problems or questions after your procedure. What can I expect after the procedure? After the procedure, it is common to have:  Slight bleeding from the biopsy site.  Discomfort at the biopsy site.  Follow these instructions at home: Biopsy Site Care   Do not rub the biopsy area after urinating. Gently pat the area dry or use a bottle filled with warm water (peri-bottle) to clean the area. Gently wipe from front to back.  Follow instructions from your health care provider about how to take care of your biopsy site. Make sure you: ? Clean the area using water and mild soap twice a day or as told by your health care provider. Gently pat the area dry. ? If you were prescribed an antibiotic medical ointment, apply it as told by your health care provider. Do not stop using the antibiotic even if your condition improves. ? Take a warm water bath that is taken while you are sitting down (sitz bath) as needed to help with pain or discomfort. ? Leave stitches (sutures), skin glue, or adhesive strips in place. These skin closures may need to stay in place for 2 weeks or longer. If adhesive strip edges start to loosen and curl up, you may trim the loose edges. Do not remove adhesive strips completely unless your health care provider tells you to do that.  Check your biopsy site every day for signs of infection. Check for: ? More redness, swelling, or pain. ? More fluid or blood. ? Warmth. ? Pus or a bad smell. Lifestyle  Wear loose, cotton underwear. Do not wear tight pants.  Do not use a tampon, douche, or put  anything inside your vagina for at least one week or until your health care provider approves.  Do not have sex for at least one week or until your health care provider approves.  Do not exercise, such as running or biking, until your health care provider approves.  Do not take baths, swim, or use a hot tub until your health care provider approves. General instructions  Take over-the-counter and prescription medicines only as told by your health care provider.  Use a sanitary napkin until bleeding stops.  Keep all follow-up visits as told by your health care provider. This is important.  If the sample is being sent for testing, it is your responsibility to get the results of your procedure. Ask your health care provider or the department performing the procedure when your results will be ready. Contact a health care provider if:  You have more redness, swelling, or pain around your biopsy site.  You have more fluid or blood coming from your biopsy site.  Your biopsy site feels warm to the touch.  Your pain is not controlled with medicine. Get help right away if:  You have heavy bleeding from the vulva.  You have pus or a bad smell coming from your biopsy site.  You have a fever.  You have lower belly pain. This information is not intended to replace advice given to you by your health care provider. Make sure you discuss any questions you have with your health care provider.  Document Released: 02/23/2012 Document Revised: 08/14/2015 Document Reviewed: 01/27/2015 Elsevier Interactive Patient Education  2018 [place] Biopsy A vulva biopsy is a procedure to remove a small sample of tissue from the vulva. The vulva is the outside part of the female genitals. The vulva includes the outside folds of skin (labia majora), the inner lips (labia minora), the clitoris, and the openings of the urethra and vagina. You may have this procedure to get more information about or  diagnose a lesion, growth, rash, blister, or some other unusual discoloration. This procedure may also be done to remove a mole or wart. Tell a health care provider about:  Any allergies you have.  All medicines you are taking, including vitamins, herbs, eye drops, creams, and over-the-counter medicines.  Any problems you or family members have had with anesthetic medicines.  Any blood disorders you have.  Any surgeries you have had.  Any medical conditions you have.  Whether you are pregnant or may be pregnant. What are the risks? Generally, this is a safe procedure. However, problems may occur, including:  Infection.  Bleeding.  Allergic reactions to medicines.  Damage to other structures or organs.  Pain at the biopsy site.  What happens before the procedure?  Wear loose and comfortable pants and underwear for the procedure.  Follow instructions from your health care provider about eating or drinking restrictions.  Ask your health care provider about: ? Changing or stopping your regular medicines. This is especially important if you are taking diabetes medicines or blood thinners. ? Taking medicines such as aspirin and ibuprofen. These medicines can thin your blood. Do not take these medicines before your procedure if your health care provider instructs you not to.  Ask your health care provider how your surgical site will be marked or identified.  You may be given antibiotic medicine to help prevent infection. What happens during the procedure?  To reduce your risk of infection: ? Your health care team will wash or sanitize their hands. ? Your skin will be washed with soap.  You will be given a medicine to numb the area (local anesthetic).  A small tissue sample will be removed (excised). This sample may be sent for further examination depending on why you are having a biopsy.  A medicine may be applied to the biopsy site to help stop the bleeding.  The  biopsy site may be closed with stitches (sutures). The procedure may vary among health care providers and hospitals. What happens after the procedure?  You may be given pain medicine.  If the sample is being sent for testing, it is your responsibility to get the results of your procedure. Ask your health care provider or the department performing the procedure when your results will be ready.  You may be given antibiotic ointment medicine. This information is not intended to replace advice given to you by your health care provider. Make sure you discuss any questions you have with your health care provider. Document Released: 02/23/2012 Document Revised: 08/20/2015 Document Reviewed: 01/27/2015 Elsevier Interactive Patient Education  [employer].

## 2017-08-24 NOTE — Progress Notes (Signed)
Chaperoned pelvic exam and biopsy. Right vulva 9 o'clock biopsy sent to pathology. Oncology Nurse Navigator Documentation  Navigator Location: [facility] (08/24/17 1200)   )Navigator Encounter Type: Follow-up Appt (08/24/17 1200)                     Patient Visit Type: GynOnc (08/24/17 1200)                              Time Spent with Patient: 30 (08/24/17 1200)

## 2017-08-24 NOTE — Progress Notes (Signed)
Labia spot that was removed and it was cancer and now she feels it itching and that was the same thing that started in beginning. She has gained more fluid and trying to watch her intake but gained wt.

## 2017-08-24 NOTE — Progress Notes (Addendum)
Gynecologic Oncology Interval Note  Referring Provider: Dr. Laverta Baltimore  Chief Concern: Vulvar cancer surveillance.   Subjective:  Holly Smith is a 72 y.o. woman who presents today for continued surveillance for history of invasive vulvar cancer.   She was last seen in clinic in 02/23/17. She had a negative exam at that time.  Today, she reports overall feeling well. She reports itching on her right labia and feels this area may have been biopsied in the past.    Oncology Treatment History:  Patient had warty lesion on the upper right labia for about two years.    Hysterectomy in the past for benign disease.  Then BSO in 2003 for benign ovarian tumor.  03/21/12 PAP + HR HPV vaginal bx 2 o'clock negative, 9 o'clock HPV effect  Vulvar itching and pain.  07/18/12 - vulvar bx showed condyloma  1/32/44 - vulvar bx - lichen sclerosis  0/10/27 - Exophytic mass on right anterior vulva. 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  Negative margins, but within 52mm of cancer.  No LVSI.    We discussed the rationale for re-excision of the scar to insure that there is no residual cancer present in view of the close 2 mm margin and sentinel lymph node mapping and biopsy because the tumor has > 46mm invasion.  Had partial right modified radical vulvectomy on 07/02/14 at [facility] for invasive verrucous carcinoma of the vulva. The final pathology revealed: negative lymph nodes, no evidence of residual disease.  She was referred to Dr Erlene Quan in Urology because of abnormal appearing urethral meatus.  Had cysto and only squamous metaplasia found.  10/19/2016- Biopsy from vagina showed Lichen Sclerosis.  She had left ureteral stone removed with ureteroscope and got septic and was hospitalized.  No vulvar complaints today.  Still has urinary leakage for which she uses Vesicare.   PAP 3/16 normal.  No gyn complaints.       Problem  List: Patient Active Problem List   Diagnosis Date Noted  . Vertigo 03/11/2017  . Sepsis ([place]) 12/13/2016  . Dyspepsia   . Gastritis without bleeding   . Other specified diseases of esophagus   . History of alcohol use 09/07/2016  . Hypertensive heart disease 05/12/2016  . Atrial fibrillation, persistent ([place]) 08/13/2015  . COPD (chronic obstructive pulmonary disease) ([place]) 08/13/2015  . Personal history of other specified conditions 08/13/2015  . Insulin resistance 08/13/2015  . Obesity, unspecified 08/13/2015  . Special screening for malignant neoplasms, colon   . Benign neoplasm of ascending colon   . Benign neoplasm of descending colon   . Benign neoplasm of sigmoid colon   . Chronic diastolic heart failure ([place]) 05/05/2015  . Diverticulitis 01/31/2015  . Diverticulitis large intestine w/o perforation or abscess w/bleeding 01/28/2015  . Chronic combined systolic and diastolic CHF, NYHA class 3 ([place]) 01/28/2015  . Acute diverticulitis 01/28/2015  . Diverticulitis of large intestine without perforation or abscess with bleeding   . Acute on chronic diastolic heart failure ([place]) 01/21/2015  . Hypertensive urgency 01/21/2015  . Morbid obesity ([place]) 01/21/2015  . Blood in stool 01/21/2015  . Acute diastolic CHF (congestive heart failure) ([place]) 01/21/2015  . CAFL (chronic airflow limitation) ([place]) 12/16/2014  . Personal history of perinatal problems 12/16/2014  . HTN (hypertension) 12/16/2014  . Hyperlipidemia, unspecified 12/16/2014  . Dysmetabolic syndrome 25/36/6440  . Kidney stones 12/16/2014  . PVC (premature ventricular contraction) 12/16/2014  . Snoring 12/16/2014  .  Stress incontinence 12/16/2014  . Fast heart beat 12/16/2014  . Calculus (=stone) 12/16/2014  . OSA (obstructive sleep apnea) 11/27/2014  . Dyspnea 10/07/2014  . Essential hypertension 10/07/2014  . Obesity 10/07/2014  . Genital warts 08/02/2014  . Condyloma acuminatum of vulva 08/02/2014  . Condylomata  lata of vulva 08/02/2014  . Anticoagulation adequate 08/02/2014  . Chronic atrial fibrillation ([place]) 08/02/2014  . Current tobacco use 08/02/2014  . 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 Malignant neoplasm of vulva ([place]) 06/19/2014  . Vulvar cancer ([place]) 05/10/2014  . Vulvar cancer, carcinoma ([place]) 05/10/2014  . 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 Chest pain 08/20/2013  . Breath shortness 08/20/2013  . CCF (congestive cardiac failure) ([place]) 08/03/2013  . CHF (congestive heart failure) ([place]) 08/03/2013  . H/O transesophageal echocardiography (TEE) for monitoring 06/21/2013  . Echocardiogram abnormal 04/23/2013  . Mitral regurgitation 04/23/2013  . TI (tricuspid incompetence) 04/23/2013    Past Medical History: Past Medical History:  Diagnosis Date  . A-fib ([place])   . Abnormal Pap smear of vagina and vaginal HPV   . Arthritis    "bones ache" (01/23/2015)  . Cancer ([place])    vulvar cancer  . CHF (congestive heart failure) ([place])   . CHF (congestive heart failure) ([place])   . Chronic anticoagulation    She is on Xarelto for Afib  . Chronic atrial fibrillation ([place])   . Dyspnea   . Echocardiogram abnormal    Mitral regurgitation, tricuspic regurgitation  . Family history of adverse reaction to anesthesia    sister gets very nauseated and vomits  . GERD (gastroesophageal reflux disease)   . History of kidney stones   . History of stress test    Myoview 8/16: EF 42%, anteroseptal, inferoseptal, anterior, apical anterior and apical defect (likely represents breast attenuation versus scar), no ischemia; intermediate risk because of low EF  . HPV (human papilloma virus) anogenital infection    03/21/12 PAP + HR HPV  . Hypertension   . Insulin resistance   . Kidney stone   . Kidney stones   . Obesity   . OSA on CPAP   . Postoperative nausea and vomiting 1983   many years ago with hysterectomy  . Urolithiasis   . Vertigo   . Vulvar cancer ([place]) 2016    invasive verrucous carcinoma of the vulva. History of genital warts and vulvar condyloma    Past Surgical History: Past Surgical History:  Procedure Laterality Date  . ABDOMINAL HYSTERECTOMY    . APPENDECTOMY    . BILATERAL SALPINGOOPHORECTOMY  2003   benign ovarian cancer  . CARDIOVERSION  2014-2015   "[place]"  . COLONOSCOPY WITH PROPOFOL N/A 06/10/2015   Procedure: COLONOSCOPY WITH PROPOFOL;  Surgeon: Lucilla Lame, MD;  Location: [facility] ENDOSCOPY;  Service: Endoscopy;  Laterality: N/A;  . CYSTOSCOPY W/ RETROGRADES Bilateral 10/19/2016   Procedure: CYSTOSCOPY WITH RETROGRADE PYELOGRAM;  Surgeon: Hollice Espy, MD;  Location: [facility] ORS;  Service: Urology;  Laterality: Bilateral;  . CYSTOSCOPY W/ URETERAL STENT PLACEMENT Left 12/29/2016   Procedure: CYSTOSCOPY WITH STENT REPLACEMENT;  Surgeon: Hollice Espy, MD;  Location: [facility] ORS;  Service: Urology;  Laterality: Left;  . CYSTOSCOPY WITH BIOPSY N/A 10/19/2016   Procedure: CYSTOSCOPY WITH BLADDER BIOPSY, VAGINAL WALL BIOPSY;  Surgeon: Hollice Espy, MD;  Location: [facility] ORS;  Service: Urology;  Laterality: N/A;  . CYSTOSCOPY WITH STENT PLACEMENT Left 12/14/2016   Procedure: CYSTOSCOPY WITH STENT PLACEMENT;  Surgeon: Hollice Espy, MD;  Location:  [facility] ORS;  Service: Urology;  Laterality: Left;  . ESOPHAGOGASTRODUODENOSCOPY (EGD) WITH PROPOFOL N/A 10/05/2016   Surgeon: Lucilla Lame, MD;  Location: [facility] ENDOSCOPY;  Results: Barrett's Esophagus- repeat in 3 years 09/2019  . LITHOTRIPSY    . STONE EXTRACTION WITH BASKET Left 12/29/2016   Procedure: STONE EXTRACTION WITH BASKET;  Surgeon: Hollice Espy, MD;  Location: [facility] ORS;  Service: Urology;  Laterality: Left;  . TONSILLECTOMY    . URETEROSCOPY Left 12/29/2016   Procedure: URETEROSCOPY;  Surgeon: Hollice Espy, MD;  Location: [facility] ORS;  Service: Urology;  Laterality: Left;  Marland Kitchen VULVECTOMY Right 05/10/2014   Excisional biopsy of the superior right labial majora mass; [place] PARTIAL  07/02/2014   Re-excision and sentinel node dissection at [facility].     Family History: Family History  Problem Relation Age of Onset  . Prostate cancer Brother 35       still living and well  . Heart attack Father   . Stroke Mother   . Diabetes Mother   . Hypertension Mother   . Heart attack Brother   . Pancreatic cancer Brother   . Heart disease Sister   . Breast cancer Neg Hx   . Kidney cancer Neg Hx   . Bladder Cancer Neg Hx     Social History: Social History   Socioeconomic History  . Marital status: Divorced    Spouse name: Not on file  . Number of children: 1  . Years of education: GED  . Highest education level: 12th grade  Occupational History  . Occupation: Retired  Scientific laboratory technician  . Financial resource strain: Not hard at all  . Food insecurity:    Worry: Never true    Inability: Never true  . Transportation needs:    Medical: No    Non-medical: No  Tobacco Use  . Smoking status: Former Smoker    Packs/day: 1.00    Years: 50.00    Pack years: 50.00    Types: Cigarettes    Last attempt to quit: 05/20/2012    Years since quitting: 5.2  . Smokeless tobacco: Never Used  . Tobacco comment: smoking cessation materials not required  Substance and Sexual Activity  . Alcohol use: No    Alcohol/week: 0.0 oz  . Drug use: No  . Sexual activity: Not Currently  Lifestyle  . Physical activity:    Days per week: 2 days    Minutes per session: 60 min  . Stress: Not at all  Relationships  . Social connections:    Talks on phone: More than three times a week    Gets together: Once a week    Attends religious service: 1 to 4 times per year    Active member of club or organization: No    Attends meetings of clubs or organizations: Never    Relationship status: Divorced  . Intimate partner violence:    Fear of current or ex partner: No    Emotionally abused: No    Physically abused: No    Forced sexual activity: No  Other Topics  Concern  . Not on file  Social History Narrative  . Not on file    Allergies: Allergies  Allergen Reactions  . Ciprofloxacin Other (See Comments)    SOB    Current Medications: Current Outpatient Medications  Medication Sig Dispense Refill  . acetaminophen (TYLENOL) 325 MG tablet Take 2 tablets (650 mg total) by mouth every 6 (six) hours as needed for  mild pain (or Fever >/= 101).    Marland Kitchen albuterol (PROVENTIL HFA;VENTOLIN HFA) 108 (90 Base) MCG/ACT inhaler Inhale 2 puffs into the lungs every 8 (eight) hours as needed for wheezing or shortness of breath. 1 Inhaler 6  . furosemide (LASIX) 40 MG tablet TAKE 2 TABLETS (80 MG TOTAL) BY MOUTH DAILY. 60 tablet 12  . irbesartan (AVAPRO) 75 MG tablet TAKE 1 TABLET BY MOUTH DAILY. PATIENT NEEDS AN APPOINTMENT FOR FURTHER REFILLS 3RD/FINAL ATTEMPT 90 tablet 1  . metoprolol succinate (TOPROL-XL) 50 MG 24 hr tablet Take 1 tablet (50 mg total) by mouth 2 (two) times daily. 180 tablet 3  . metoprolol tartrate (LOPRESSOR) 100 MG tablet TAKE 1 TABLET BY MOUTH TWICE A DAY 180 tablet 3  . omeprazole (PRILOSEC) 20 MG capsule Take 20 mg by mouth daily.    Marland Kitchen triamcinolone (NASACORT ALLERGY 24HR) 55 MCG/ACT AERO nasal inhaler Place 2 sprays into the nose daily as needed.     . vitamin B-12 (CYANOCOBALAMIN) 1000 MCG tablet Take 1,000 mcg by mouth every other day.     Alveda Reasons 20 MG TABS tablet TAKE 1 TABLET (20 MG TOTAL) BY MOUTH DAILY WITH SUPPER. 90 tablet 1  . potassium chloride SA (K-DUR,KLOR-CON) 20 MEQ tablet Take 1 tablet (20 mEq total) by mouth daily. (Patient not taking: Reported on 08/24/2017) 90 tablet 3  . sertraline (ZOLOFT) 25 MG tablet TAKE 1 TABLET BY MOUTH EVERY DAY. *NEEDS APPT FOR FURTHER FILLS* 7 tablet 0   Current Facility-Administered Medications  Medication Dose Route Frequency Provider Last Rate Last Dose  . albuterol (PROVENTIL) (2.5 MG/3ML) 0.083% nebulizer solution 2.5 mg  2.5 mg Nebulization Once Juline Patch, MD          Review  of Systems Review of Systems General:  fatigue Skin: no complaints Eyes: no complaints HEENT: no complaints Breasts: no complaints Pulmonary: sob (per patient, chronic, r/t CHF) Cardiac: no complaints Gastrointestinal: no complaints Genitourinary/Sexual: no complaints Ob/Gyn: per HPI Musculoskeletal: no complaints Hematology: no complaints Neurologic/Psych: no complaints   Objective:  Physical Examination:  There were no vitals taken for this visit.  ECOG Performance Status: 0 - Asymptomatic  General appearance: alert, cooperative and appears stated age HEENT:PERRLA, neck supple with midline trachea and thyroid without masses Lymph node survey: non-palpable, axillary, inguinal, supraclavicular Cardiovascular: irregularly irregular. No murmurs, rubs or gallops noted.  Respiratory: normal air entry, lungs clear to auscultation Breast exam: not examined. Abdomen: soft, non-tender, without masses or organomegaly, no hernias and well healed incision Back: inspection of back is normal Extremities: edema - trace (per pt stable) Skin exam - normal coloration and turgor, no rashes, no suspicious skin lesions noted. Neurological exam reveals alert, oriented, normal speech, no focal findings or movement disorder noted.  Pelvic: exam chaperoned by nurse;  Vulva: s/p partial right vulvectomy; lesion on right vulva at 9:00 ~1-2 mm; Vagina: normal vagina; Cervix, Adnexa, Uterus: surgically absent; Rectal: normal rectal, no masses.  VULVAR BIOPSY NOTE The indications for vulvar biopsy (rule out neoplasia, establish lichen sclerosus diagnosis) were reviewed. Risks of the biopsy including pain, bleeding, infection, inadequate specimen, scarring and need for additional procedures were discussed. The patient stated understanding and agreed to undergo procedure today. Consent was signed, time out performed. The patient received pre-procedure teaching and expressed understanding. The  post-procedure instructions were reviewed with the patient and she expressed understanding. The patient does not have any barriers to learning.  A erythematous 61mm lesion was visible at 9 o'clock along the  suture line.  Discussed risks including but not limited to: bleeding, infection, pain, need for further surgery/procedures. Patient agreed to proceed. Time Out was performed: Patient does take xarelto for hx of a fib. The area was numbed with hurricane jelly. The patient's vulva was prepped with Betadine. 1% lidocaine was injected into area of concern, 3cc used.    A biopsy forceps was used to obtained a biopsy. Small bleeding was noted and hemostasis was achieved using silver nitrate sticks.  The patient tolerated the procedure well. Pain score = 0   Post-procedure instructions, vulvar biopsy care, were given to the patient.   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  Previous vaginal biopsy with Dr. Erlene Quan showed lichen sclerosis, but not symptomatic.   Vulvar lesion, uncertain etiology, not grossly malignant.     Plan:   Problem List Items Addressed This Visit      Other   Vulvar cancer [facility]) - Primary   Relevant Orders   Surgical pathology     Reassurance given with respect to vulvar cancer. Follow up biopsy results and treat accordingly. If positive for malignancy the area is amendable to resection.If negative for malignancy then suggested return to clinic every 6 months through 2021 for surveillance.     Vulvar care was discussed. The patient is to call with heavy bleeding, fever greater than 100.4, foul smelling vaginal discharge or other concerns.   She can follow up with Dr Erlene Quan in Urology as needed.   Beckey Rutter, DNP, AGNP-C [place] [facility] [phone] (work cell) [phone] (office)  I personally had a  face to face interaction and evaluated the patient jointly with the NP, Ms. Beckey Rutter.  I have reviewed her history and available records and have performed the key portions of the physical exam including abdominal exam, pelvic exam with my findings confirming those documented above by the APP.  I have discussed the case with the APP and the patient.  I agree with the above documentation, assessment and plan which was fully formulated by me.  Counseling was completed by me.   I personally saw the patient and performed a substantive portion of this encounter in conjunction with the listed APP as documented above. I performed the biopsy.   Kordel Leavy Gaetana Michaelis, MD  ADDENDUM:   DIAGNOSIS:  A. VULVA, RIGHT 7:61; BIOPSY:  - LICHEN PLANUS-LIKE PATTERN OF INFLAMMATION.  - PAS STAIN IS NEGATIVE; STAIN CONTROL WORKED APPROPRIATELY.  - NEGATIVE FOR MALIGNANCY.   Comment:  Mild cytologic atypia is present and is interpreted as reactive.  Re-examination after treatment is advised.  We recommend topical corticosteroid regimen for vulvar lichen planus with nightly application of clobetasol propionate 0.05% ointment to the affected area. "As a guide, half of one finger-tip-unit (the amount of ointment expressed from a tube with a nozzle of 5 mm in diameter, applied from the distal skin-crease to the tip of the index finger) should be sufficient to cover both labia majora and minora and the perianal skin." Apply nightly to the area for 1-2 months. If symptoms resolve may start taper - see below.   Return for clinical reevaluation of the response after two to three months.  .If no improvement is evident after at least eight weeks with persistent erosions, refer to dermatologist with special interest in vulvar dystrophies - Dr. Susy Manor at [facility].   Marland Kitchen  If remission then start maintenance therapy and reduce the frequency of application from every night to application every other night, then one to three times  per week indefinitely or if asymptomatic may discontinue but risk of relapse is high. If relapse occurs during the maintenance phase, we restart treatment with a superpotent topical corticosteroid nightly, as in the initial course of treatment. Once the disease is in remission, taper the frequency and potency of treatment to the lowest effective maintenance regimen.  Mariea Clonts, RN will contact the patient with this information.   Gillis Ends, MD

## 2017-08-29 LAB — SURGICAL PATHOLOGY

## 2017-08-30 ENCOUNTER — Telehealth: Payer: Self-pay

## 2017-08-30 NOTE — Telephone Encounter (Signed)
Ms. Whitehorn returned call. Reviewed pathology. She continues to have itching of the vulva. Will discuss with Dr. Theora Gianotti.  DIAGNOSIS:  A. VULVA, RIGHT 7:78; BIOPSY:  - LICHEN PLANUS-LIKE PATTERN OF INFLAMMATION.  - PAS STAIN IS NEGATIVE; STAIN CONTROL WORKED APPROPRIATELY.  - NEGATIVE FOR MALIGNANCY   Oncology Nurse Navigator Documentation  Navigator Location: [facility] (08/30/17 1454)   )Navigator Encounter Type: Telephone;Diagnostic Results (08/30/17 1454)                     Patient Visit Type: GynOnc (08/30/17 1454)                              Time Spent with Patient: 15 (08/30/17 1454)

## 2017-08-30 NOTE — Telephone Encounter (Signed)
Voicemail left with Holly Smith to return call for biopsy results. Will discuss with Dr. Theora Gianotti 6/12. Oncology Nurse Navigator Documentation  Navigator Location: [facility] (08/30/17 1400)   )Navigator Encounter Type: Telephone;Diagnostic Results (08/30/17 1400)                                                    Time Spent with Patient: 15 (08/30/17 1400)

## 2017-08-31 ENCOUNTER — Telehealth: Payer: Self-pay

## 2017-09-01 ENCOUNTER — Telehealth: Payer: Self-pay

## 2017-09-01 MED ORDER — CLOBETASOL PROPIONATE 0.05 % EX OINT: 1.0000 "application " | TOPICAL_OINTMENT | Freq: Every day | CUTANEOUS | 1 refills | Status: DC

## 2017-09-01 NOTE — Telephone Encounter (Signed)
Voicemail left with Ms. Wolfson to return call for education regarding Clobetasol Oncology Nurse Navigator Documentation  Navigator Location: [facility] (09/01/17 0900)   )Navigator Encounter Type: Telephone;Education (09/01/17 0900) Telephone: Outgoing Call;Education (09/01/17 0900)                                                  Time Spent with Patient: 15 (09/01/17 0900)

## 2017-09-05 ENCOUNTER — Telehealth: Payer: Self-pay

## 2017-09-05 NOTE — Telephone Encounter (Signed)
Holly Smith returned call. Educated on Clobetasol, amount to use, when to use, as instructed in progress note. We will see her back in clinic 8/14 for follow up. Oncology Nurse Navigator Documentation  Navigator Location: [facility] (09/05/17 1400)   )Navigator Encounter Type: Telephone (09/05/17 1400) Telephone: Incoming Call;Education (09/05/17 1400)                   Patient Visit Type: GynOnc (09/05/17 1400)                              Time Spent with Patient: 15 (09/05/17 1400)

## 2017-09-13 NOTE — Telephone Encounter (Signed)
error 

## 2017-11-02 ENCOUNTER — Inpatient Hospital Stay: Payer: Medicare HMO

## 2017-11-07 NOTE — Progress Notes (Signed)
Cardiology Office Note   Date:  11/09/2017   ID:  Holly Smith, Holly Smith 1945/06/11, MRN [id number]  PCP:  Holly Patch, MD    No chief complaint on file.  AFib  Wt Readings from Last 3 Encounters:  11/09/17 222 lb 6.4 oz (100.9 kg)  08/24/17 221 lb 4.8 oz (100.4 kg)  08/09/17 214 lb 9.6 oz (97.3 kg)       History of Present Illness: Holly Smith is a 72 y.o. female  Who had PAF and a tachycardia mediated cardiomyopathy in 2015. She was cardioverted at [facility]. She saw me for the first time in 2016.   She had been on Lasix 80 mg daily.  She cut that back to 40 mg daily in early 2019.  She has been struggling with her weight.  She has been eating more.  She had discussed Atkins diet with Holly Smith in 5/19.  SHe did not start this.    She feels that her fluid status has been well controlled. She can tell a difference from increasing obesity vs. Weight gain from overeating.   She has ben working more around the house.  She lives alone so does not have the motivation to cook.  She avoids going to fast food restaurants.    Denies : Chest pain. Dizziness. Leg edema. Nitroglycerin use. Orthopnea. Palpitations. Paroxysmal nocturnal dyspnea. Syncope.     Past Medical History:  Diagnosis Date  . A-fib ([place])   . Abnormal Pap smear of vagina and vaginal HPV   . Acute diastolic CHF (congestive heart failure) ([place]) 01/21/2015  . Acute diverticulitis 01/28/2015  . Acute on chronic diastolic heart failure ([place]) 01/21/2015  . Anticoagulation adequate 08/02/2014   Overview:  Xarelto 20hs; CHADS=1+ htn, female, age  Overview:  Xarelto 20hs; CHADS=1+ htn, female, age  . Arthritis    "bones ache" (01/23/2015)  . Atrial fibrillation, persistent ([place]) 08/13/2015   Overview:  symptomatic (fatigue, DOE, decreased exercise tolerance, palpitations), drug refractory (propafenone, amiodarone), complicated recovery from DCCV possibly related to repiratory obstruction [facility] (patient fear of  DCCV)  Overview:  symptomatic (fatigue, DOE, decreased exercise tolerance, palpitations), drug refractory (propafenone, amiodarone), complicated recovery from DCCV possibly relat  . Benign neoplasm of ascending colon   . Benign neoplasm of descending colon   . Benign neoplasm of sigmoid colon   . Blood in stool 01/21/2015  . Breath shortness 08/20/2013  . CAFL (chronic airflow limitation) ([place]) 12/16/2014  . Cancer ([place])    vulvar cancer  . CCF (congestive cardiac failure) ([place]) 08/03/2013   Overview:  ACUTE SYSTOLIC   . Chest pain 08/20/2013  . CHF (congestive heart failure) ([place])   . CHF (congestive heart failure) ([place])   . Chronic anticoagulation    She is on Xarelto for Afib  . Chronic atrial fibrillation ([place])   . Chronic combined systolic and diastolic CHF, NYHA class 3 ([place]) 01/28/2015  . Chronic diastolic heart failure ([place]) 05/05/2015  . Condyloma acuminatum of vulva 08/02/2014  . COPD (chronic obstructive pulmonary disease) ([place]) 08/13/2015  . Diverticulitis large intestine w/o perforation or abscess w/bleeding 01/28/2015  . Diverticulitis of large intestine without perforation or abscess with bleeding   . Dysmetabolic syndrome 09/20/6376   Overview:  BG-166 06/15/2013; elevated BMI; central obesity   . Dyspepsia   . Dyspnea   . Echocardiogram abnormal    Mitral regurgitation, tricuspic regurgitation  . Essential hypertension 10/07/2014   Trial off acei 10/07/2014  Due to atypical sob (talking =  walking)> no real change 11/18/2014    . Family history of adverse reaction to anesthesia    sister gets very nauseated and vomits  . Gastritis without bleeding   . GERD (gastroesophageal reflux disease)   . History of kidney stones   . History of stress test    Myoview 8/16: EF 42%, anteroseptal, inferoseptal, anterior, apical anterior and apical defect (likely represents breast attenuation versus scar), no ischemia; intermediate risk because of low EF  . HPV (human papilloma virus) anogenital  infection    03/21/12 PAP + HR HPV  . HTN (hypertension) 12/16/2014  . Hypertension   . Hypertensive heart disease 05/12/2016  . Hypertensive urgency 01/21/2015  . Insulin resistance   . Kidney stone   . Kidney stones   . Malignant neoplasm of vulva ([place]) 06/19/2014   Overview:  Ms Holly Smith is a 72 yo who was referred by Dr Ouida Sills for a vulvar lesion. 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 The specimen had negative margins, but was within 2 mm of the cancer.    . Mitral regurgitation 04/23/2013   Overview:  "Moderate to severe" by TTE, "mild to mod" by TEE  Overview:  "Moderate to severe" by TTE, "mild to mod" by TEE  . Obesity   . OSA (obstructive sleep apnea) 11/27/2014   07/2013 RDI 17/h CPAP 8   . OSA on CPAP   . Other specified diseases of esophagus   . Postoperative nausea and vomiting 1983   many years ago with hysterectomy  . PVC (premature ventricular contraction) 12/16/2014  . Sepsis ([place]) 12/13/2016  . TI (tricuspid incompetence) 04/23/2013   Overview:  "moderate" by TTE   . Urolithiasis   . Vertigo   . Vulvar cancer ([place]) 2016   invasive verrucous carcinoma of the vulva. History of genital warts and vulvar condyloma  . Vulvar cancer, carcinoma ([place]) 05/10/2014    Past Surgical History:  Procedure Laterality Date  . ABDOMINAL HYSTERECTOMY    . APPENDECTOMY    . BILATERAL SALPINGOOPHORECTOMY  2003   benign ovarian cancer  . CARDIOVERSION  2014-2015   "[place]"  . COLONOSCOPY WITH PROPOFOL N/A 06/10/2015   Procedure: COLONOSCOPY WITH PROPOFOL;  Surgeon: Lucilla Lame, MD;  Location: [facility] ENDOSCOPY;  Service: Endoscopy;  Laterality: N/A;  . CYSTOSCOPY W/ RETROGRADES Bilateral 10/19/2016   Procedure: CYSTOSCOPY WITH RETROGRADE PYELOGRAM;  Surgeon: Hollice Espy, MD;  Location: [facility] ORS;  Service: Urology;  Laterality: Bilateral;  . CYSTOSCOPY W/ URETERAL STENT PLACEMENT Left  12/29/2016   Procedure: CYSTOSCOPY WITH STENT REPLACEMENT;  Surgeon: Hollice Espy, MD;  Location: [facility] ORS;  Service: Urology;  Laterality: Left;  . CYSTOSCOPY WITH BIOPSY N/A 10/19/2016   Procedure: CYSTOSCOPY WITH BLADDER BIOPSY, VAGINAL WALL BIOPSY;  Surgeon: Hollice Espy, MD;  Location: [facility] ORS;  Service: Urology;  Laterality: N/A;  . CYSTOSCOPY WITH STENT PLACEMENT Left 12/14/2016   Procedure: CYSTOSCOPY WITH STENT PLACEMENT;  Surgeon: Hollice Espy, MD;  Location: [facility] ORS;  Service: Urology;  Laterality: Left;  . ESOPHAGOGASTRODUODENOSCOPY (EGD) WITH PROPOFOL N/A 10/05/2016   Surgeon: Lucilla Lame, MD;  Location: [facility] ENDOSCOPY;  Results: Barrett's Esophagus- repeat in 3 years 09/2019  . LITHOTRIPSY    . STONE EXTRACTION WITH BASKET Left 12/29/2016   Procedure: STONE EXTRACTION WITH BASKET;  Surgeon: Hollice Espy, MD;  Location: [facility] ORS;  Service: Urology;  Laterality: Left;  . TONSILLECTOMY    . URETEROSCOPY Left  12/29/2016   Procedure: URETEROSCOPY;  Surgeon: Hollice Espy, MD;  Location: [facility] ORS;  Service: Urology;  Laterality: Left;  Marland Kitchen VULVECTOMY Right 05/10/2014   Excisional biopsy of the superior right labial majora mass; [place] PARTIAL  07/02/2014   Re-excision and sentinel node dissection at [facility].      Current Outpatient Medications  Medication Sig Dispense Refill  . acetaminophen (TYLENOL) 325 MG tablet Take 2 tablets (650 mg total) by mouth every 6 (six) hours as needed for mild pain (or Fever >/= 101).    Marland Kitchen albuterol (PROVENTIL HFA;VENTOLIN HFA) 108 (90 Base) MCG/ACT inhaler Inhale 2 puffs into the lungs every 8 (eight) hours as needed for wheezing or shortness of breath. 1 Inhaler 6  . clobetasol ointment (TEMOVATE) 3.47 % Apply 1 application topically at bedtime. To affected area 30 g 1  . furosemide (LASIX) 40 MG tablet TAKE 2 TABLETS (80 MG TOTAL) BY MOUTH DAILY. 60 tablet 12  . irbesartan (AVAPRO) 75 MG tablet TAKE 1 TABLET BY  MOUTH DAILY. PATIENT NEEDS AN APPOINTMENT FOR FURTHER REFILLS 3RD/FINAL ATTEMPT 90 tablet 1  . metoprolol succinate (TOPROL-XL) 50 MG 24 hr tablet Take 1 tablet (50 mg total) by mouth 2 (two) times daily. 180 tablet 3  . metoprolol tartrate (LOPRESSOR) 100 MG tablet TAKE 1 TABLET BY MOUTH TWICE A DAY 180 tablet 3  . omeprazole (PRILOSEC) 20 MG capsule Take 20 mg by mouth daily.    . potassium chloride SA (K-DUR,KLOR-CON) 20 MEQ tablet Take 20 mEq by mouth daily as needed.    . triamcinolone (NASACORT ALLERGY 24HR) 55 MCG/ACT AERO nasal inhaler Place 2 sprays into the nose daily as needed.     . vitamin B-12 (CYANOCOBALAMIN) 1000 MCG tablet Take 1,000 mcg by mouth every other day.     Alveda Reasons 20 MG TABS tablet TAKE 1 TABLET (20 MG TOTAL) BY MOUTH DAILY WITH SUPPER. 90 tablet 1   Current Facility-Administered Medications  Medication Dose Route Frequency Provider Last Rate Last Dose  . albuterol (PROVENTIL) (2.5 MG/3ML) 0.083% nebulizer solution 2.5 mg  2.5 mg Nebulization Once Holly Patch, MD        Allergies:   Ciprofloxacin    Social History:  The patient  reports that she quit smoking about 5 years ago. Her smoking use included cigarettes. She has a 50.00 pack-year smoking history. She has never used smokeless tobacco. She reports that she does not drink alcohol or use drugs.   Family History:  The patient's family history includes Diabetes in her mother; Heart attack in her brother and father; Heart disease in her sister; Hypertension in her mother; Pancreatic cancer in her brother; Prostate cancer (age of onset: 33) in her brother; Stroke in her mother.    ROS:  Please see the history of present illness.   Otherwise, review of systems are positive for weight gain.   All other systems are reviewed and negative.    PHYSICAL EXAM: VS:  BP 130/86   Pulse 83   Ht 5' (1.524 m)   Wt 222 lb 6.4 oz (100.9 kg)   SpO2 97%   BMI 43.43 kg/m  , BMI Body mass index is 43.43 kg/m. GEN:  Well nourished, well developed, in no acute distress  HEENT: normal  Neck: no JVD, carotid bruits, or masses Cardiac: RRR; no murmurs, rubs, or gallops,no edema  Respiratory:  clear to auscultation bilaterally, normal work of breathing GI: soft, nontender, nondistended, + BS MS: no  deformity or atrophy  Skin: warm and dry, no rash Neuro:  Strength and sensation are intact Psych: euthymic mood, full affect   EKG:   The ekg ordered today demonstrates AFib, rate controlled, PVCs   Recent Labs: 12/13/2016: B Natriuretic Peptide 155.0 03/11/2017: ALT 16; Hemoglobin 13.3; Platelets 214 03/12/2017: Magnesium 1.8 08/05/2017: BUN 25; Creatinine, Ser 1.07; Potassium 4.8; Sodium 138   Lipid Panel    Component Value Date/Time   CHOL 168 07/19/2017 0949   CHOL 165 03/18/2016 1056   TRIG 113 07/19/2017 0949   HDL 40 (L) 07/19/2017 0949   HDL 42 03/18/2016 1056   CHOLHDL 4.2 07/19/2017 0949   VLDL 23 07/19/2017 0949   LDLCALC 105 (H) 07/19/2017 0949   LDLCALC 89 03/18/2016 1056     Other studies Reviewed: Additional studies/ records that were reviewed today with results demonstrating: .   ASSESSMENT AND PLAN:  1. Chronic diastolic heart failure: She appears euvolemic.  She feels it if she misses her Lasix.  She is compliant with her diuretic. 2. AFib: Rate controlled. Xarelto for stroke prevention.  No bleeding problems.  3. HTN heart disease: BP controlled.   4. Morbid obesity: Needs to decrease intake.  We discussed weight loss surgery referral.  She had a friend who died many years ago during weight loss surgery so she is hesitant.  She prefers the Atkins diet to try to lose weight.  She has done well with this in the past.  5. Lipids: COntrolledLDL 105 in 4/19.  Will recheck in 4/20.    Current medicines are reviewed at length with the patient today.  The patient concerns regarding her medicines were addressed.  The following changes have been made:  No change  Labs/ tests  ordered today include:  No orders of the defined types were placed in this encounter.   Recommend 150 minutes/week of aerobic exercise Low fat, low carb, high fiber diet recommended  Disposition:   FU in 6 months with APP   Signed, Larae Grooms, MD  11/09/2017 10:47 AM    [facility] [address], [place], [place]  [address] Phone: [phone]; Fax: [phone]

## 2017-11-09 ENCOUNTER — Encounter: Payer: Self-pay | Admitting: Interventional Cardiology

## 2017-11-09 ENCOUNTER — Ambulatory Visit: Payer: Medicare HMO | Admitting: Interventional Cardiology

## 2017-11-09 VITALS — BP 130/86 | HR 83 | Ht 60.0 in | Wt 222.4 lb

## 2017-11-09 DIAGNOSIS — E782 Mixed hyperlipidemia: Secondary | ICD-10-CM | POA: Diagnosis not present

## 2017-11-09 DIAGNOSIS — I481 Persistent atrial fibrillation: Secondary | ICD-10-CM | POA: Diagnosis not present

## 2017-11-09 DIAGNOSIS — I110 Hypertensive heart disease with heart failure: Secondary | ICD-10-CM

## 2017-11-09 DIAGNOSIS — I4819 Other persistent atrial fibrillation: Secondary | ICD-10-CM

## 2017-11-09 DIAGNOSIS — I5030 Unspecified diastolic (congestive) heart failure: Secondary | ICD-10-CM | POA: Diagnosis not present

## 2017-11-09 DIAGNOSIS — Z13220 Encounter for screening for lipoid disorders: Secondary | ICD-10-CM

## 2017-11-09 DIAGNOSIS — I5032 Chronic diastolic (congestive) heart failure: Secondary | ICD-10-CM | POA: Diagnosis not present

## 2017-11-09 LAB — COMPREHENSIVE METABOLIC PANEL WITH GFR
ALT: 14 [IU]/L (ref 0–32)
AST: 17 [IU]/L (ref 0–40)
Albumin/Globulin Ratio: 1.3 (ref 1.2–2.2)
Albumin: 4.2 g/dL (ref 3.5–4.8)
Alkaline Phosphatase: 72 [IU]/L (ref 39–117)
BUN/Creatinine Ratio: 15 (ref 12–28)
BUN: 15 mg/dL (ref 8–27)
Bilirubin Total: 0.5 mg/dL (ref 0.0–1.2)
CO2: 27 mmol/L (ref 20–29)
Calcium: 9.2 mg/dL (ref 8.7–10.3)
Chloride: 96 mmol/L (ref 96–106)
Creatinine, Ser: 1.01 mg/dL — ABNORMAL HIGH (ref 0.57–1.00)
GFR calc Af Amer: 64 mL/min/{1.73_m2}
GFR calc non Af Amer: 55 mL/min/{1.73_m2} — ABNORMAL LOW
Globulin, Total: 3.2 g/dL (ref 1.5–4.5)
Glucose: 159 mg/dL — ABNORMAL HIGH (ref 65–99)
Potassium: 3.4 mmol/L — ABNORMAL LOW (ref 3.5–5.2)
Sodium: 139 mmol/L (ref 134–144)
Total Protein: 7.4 g/dL (ref 6.0–8.5)

## 2017-11-09 LAB — CBC
Hematocrit: 40.5 % (ref 34.0–46.6)
Hemoglobin: 13.7 g/dL (ref 11.1–15.9)
MCH: 31.5 pg (ref 26.6–33.0)
MCHC: 33.8 g/dL (ref 31.5–35.7)
MCV: 93 fL (ref 79–97)
PLATELETS: 257 10*3/uL (ref 150–450)
RBC: 4.35 x10E6/uL (ref 3.77–5.28)
RDW: 13.9 % (ref 12.3–15.4)
WBC: 8.7 10*3/uL (ref 3.4–10.8)

## 2017-11-09 LAB — TSH: TSH: 1.47 u[IU]/mL (ref 0.450–4.500)

## 2017-11-09 NOTE — Patient Instructions (Signed)
Medication Instructions:  Your physician recommends that you continue on your current medications as directed. Please refer to the Current Medication list given to you today.   Labwork: TODAY: CMET, CBC, TSH  Your physician recommends that you return for a FASTING lipid profile in 6 months  Testing/Procedures: None ordered  Follow-Up: Your physician wants you to follow-up in: 6 months with Dr. Irish Lack. You will receive a reminder letter in the mail two months in advance. If you don't receive a letter, please call our office to schedule the follow-up appointment.   Any Other Special Instructions Will Be Listed Below (If Applicable).     If you need a refill on your cardiac medications before your next appointment, please call your pharmacy.

## 2017-11-15 ENCOUNTER — Telehealth: Payer: Self-pay

## 2017-11-15 DIAGNOSIS — E876 Hypokalemia: Secondary | ICD-10-CM

## 2017-11-15 NOTE — Telephone Encounter (Signed)
-----   Message from Jettie Booze, MD sent at 11/15/2017  4:27 PM EDT ----- Potassium slightly low.  Can eat some potassium rich foods.  Otherwise, electrolytes, liver and CBC and TSH are well controlled.

## 2017-11-15 NOTE — Telephone Encounter (Signed)
Patient made aware of results. Instructed patient to increase her intake of potassium rich foods. Patient has k-dur 20 mEq tablets that she only takes prn. Patient currently taking lasix 40 mg: 2 tablets QD. Discussed with Dr. Irish Lack and patient will take k-dur 20 mEq today only and then continue to eat foods that are rich in potassium. Patient to have repeat BMET next Tuesday at [facility]. Patient verbalizes understanding and thanked me for the call.

## 2017-11-18 ENCOUNTER — Other Ambulatory Visit: Payer: Self-pay

## 2017-11-20 ENCOUNTER — Other Ambulatory Visit: Payer: Self-pay | Admitting: Gastroenterology

## 2017-11-23 ENCOUNTER — Other Ambulatory Visit
Admission: RE | Admit: 2017-11-23 | Discharge: 2017-11-23 | Disposition: A | Discharge status: Home / Self Care | Payer: Medicare HMO | Source: Ambulatory Visit | Attending: Interventional Cardiology | Admitting: Interventional Cardiology

## 2017-11-23 DIAGNOSIS — I481 Persistent atrial fibrillation: Secondary | ICD-10-CM | POA: Diagnosis not present

## 2017-11-23 DIAGNOSIS — I5030 Unspecified diastolic (congestive) heart failure: Secondary | ICD-10-CM | POA: Insufficient documentation

## 2017-11-23 DIAGNOSIS — I5032 Chronic diastolic (congestive) heart failure: Secondary | ICD-10-CM | POA: Diagnosis not present

## 2017-11-23 DIAGNOSIS — E876 Hypokalemia: Secondary | ICD-10-CM

## 2017-11-23 DIAGNOSIS — I110 Hypertensive heart disease with heart failure: Secondary | ICD-10-CM

## 2017-11-23 DIAGNOSIS — Z13220 Encounter for screening for lipoid disorders: Secondary | ICD-10-CM | POA: Diagnosis not present

## 2017-11-23 DIAGNOSIS — I4819 Other persistent atrial fibrillation: Secondary | ICD-10-CM

## 2017-11-23 LAB — BASIC METABOLIC PANEL
Anion gap: 8 (ref 5–15)
BUN: 20 mg/dL (ref 8–23)
CHLORIDE: 105 mmol/L (ref 98–111)
CO2: 27 mmol/L (ref 22–32)
CREATININE: 0.98 mg/dL (ref 0.44–1.00)
Calcium: 8.9 mg/dL (ref 8.9–10.3)
GFR calc non Af Amer: 56 mL/min — ABNORMAL LOW (ref >60)
Glucose, Bld: 210 mg/dL — ABNORMAL HIGH (ref 70–99)
Potassium: 3.2 mmol/L — ABNORMAL LOW (ref 3.5–5.1)
Sodium: 140 mmol/L (ref 135–145)

## 2017-11-23 LAB — LIPID PANEL
Cholesterol: 176 mg/dL (ref 0–200)
HDL: 33 mg/dL — ABNORMAL LOW (ref >40)
LDL CALC: 119 mg/dL — AB (ref 0–99)
Total CHOL/HDL Ratio: 5.3 RATIO
Triglycerides: 118 mg/dL (ref <150)
VLDL: 24 mg/dL (ref 0–40)

## 2017-11-25 ENCOUNTER — Other Ambulatory Visit: Payer: Self-pay

## 2017-11-25 DIAGNOSIS — E876 Hypokalemia: Secondary | ICD-10-CM

## 2017-11-25 MED ORDER — POTASSIUM CHLORIDE CRYS ER 20 MEQ PO TBCR: 20.0000 meq | EXTENDED_RELEASE_TABLET | Freq: Every day | ORAL | 3 refills | Status: DC

## 2017-11-30 ENCOUNTER — Inpatient Hospital Stay: Payer: Medicare HMO | Attending: Obstetrics and Gynecology | Admitting: Obstetrics and Gynecology

## 2017-11-30 VITALS — BP 118/70 | HR 66 | Temp 98.1°F | Resp 18 | Ht 60.0 in | Wt 222.5 lb

## 2017-11-30 DIAGNOSIS — Z90722 Acquired absence of ovaries, bilateral: Secondary | ICD-10-CM | POA: Diagnosis not present

## 2017-11-30 DIAGNOSIS — C519 Malignant neoplasm of vulva, unspecified: Secondary | ICD-10-CM | POA: Insufficient documentation

## 2017-11-30 DIAGNOSIS — L439 Lichen planus, unspecified: Secondary | ICD-10-CM | POA: Diagnosis not present

## 2017-11-30 DIAGNOSIS — Z87891 Personal history of nicotine dependence: Secondary | ICD-10-CM | POA: Diagnosis not present

## 2017-11-30 DIAGNOSIS — R69 Illness, unspecified: Secondary | ICD-10-CM | POA: Diagnosis not present

## 2017-11-30 DIAGNOSIS — Z90710 Acquired absence of both cervix and uterus: Secondary | ICD-10-CM | POA: Insufficient documentation

## 2017-11-30 DIAGNOSIS — A630 Anogenital (venereal) warts: Secondary | ICD-10-CM | POA: Diagnosis not present

## 2017-11-30 NOTE — Progress Notes (Signed)
Gynecologic Oncology Interval Note  Referring Provider: Dr. Laverta Baltimore  Chief Concern: Vulvar cancer surveillance.   Subjective:  Caleyah Lacara Dunsworth is a 72 y.o. woman who presents today for continued surveillance for history of invasive vulvar cancer.   She was last seen in clinic on 08/24/17. Biopsy was performed by Dr. Theora Gianotti and showed Lichen Planus.    DIAGNOSIS:  A. VULVA, RIGHT 1:09; BIOPSY:  - LICHEN PLANUS-LIKE PATTERN OF INFLAMMATION.  - PAS STAIN IS NEGATIVE; STAIN CONTROL WORKED APPROPRIATELY.  - NEGATIVE FOR MALIGNANCY.   Comment:  Mild cytologic atypia is present and is interpreted as reactive. Re-examination after treatment is advised.   She started nightly clobetasol propionate 0.05% to affected area. She returns to clinic today for re-evaluation.   Today, she reports overall feeling well. She has been using the cream most every night and feels she is tolerating well without significant side effects.    Oncology Treatment History:  Patient had warty lesion on the upper right labia for about two years.    Hysterectomy in the past for benign disease.  Then BSO in 2003 for benign ovarian tumor.  03/21/12 PAP + HR HPV vaginal bx 2 o'clock negative, 9 o'clock HPV effect  Vulvar itching and pain.  07/18/12 - vulvar bx showed condyloma  06/11/53 - vulvar bx - lichen sclerosis  7/32/20 - Exophytic mass on right anterior vulva. 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  Negative margins, but within 43mm of cancer.  No LVSI.    We discussed the rationale for re-excision of the scar to insure that there is no residual cancer present in view of the close 2 mm margin and sentinel lymph node mapping and biopsy because the tumor has > 9mm invasion.  Had partial right modified radical vulvectomy on 07/02/14 at [facility] for invasive verrucous carcinoma of the vulva. The final pathology revealed: negative lymph nodes, no  evidence of residual disease.  She was referred to Dr Erlene Quan in Urology because of abnormal appearing urethral meatus.  Had cysto and only squamous metaplasia found.  10/19/2016- Biopsy from vagina showed Lichen Sclerosis.  She had left ureteral stone removed with ureteroscope and got septic and was hospitalized.  No vulvar complaints today.  Still has urinary leakage for which she uses Vesicare.   PAP 3/16 normal.  No gyn complaints.       Problem List: Patient Active Problem List   Diagnosis Date Noted  . Vertigo 03/11/2017  . Sepsis ([place]) 12/13/2016  . Dyspepsia   . Gastritis without bleeding   . Other specified diseases of esophagus   . History of alcohol use 09/07/2016  . Hypertensive heart disease 05/12/2016  . Atrial fibrillation, persistent ([place]) 08/13/2015  . COPD (chronic obstructive pulmonary disease) ([place]) 08/13/2015  . Personal history of other specified conditions 08/13/2015  . Insulin resistance 08/13/2015  . Obesity, unspecified 08/13/2015  . Special screening for malignant neoplasms, colon   . Benign neoplasm of ascending colon   . Benign neoplasm of descending colon   . Benign neoplasm of sigmoid colon   . Chronic diastolic heart failure ([place]) 05/05/2015  . Diverticulitis 01/31/2015  . Diverticulitis large intestine w/o perforation or abscess w/bleeding 01/28/2015  . Chronic combined systolic and diastolic CHF, NYHA class 3 ([place]) 01/28/2015  . Acute diverticulitis 01/28/2015  . Diverticulitis of large intestine without perforation or abscess with bleeding   . Acute on chronic diastolic heart failure ([place]) 01/21/2015  .  Hypertensive urgency 01/21/2015  . Morbid obesity ([place]) 01/21/2015  . Blood in stool 01/21/2015  . Acute diastolic CHF (congestive heart failure) ([place]) 01/21/2015  . CAFL (chronic airflow limitation) ([place]) 12/16/2014  . Personal history of perinatal problems 12/16/2014  . HTN (hypertension) 12/16/2014  . Hyperlipidemia, unspecified 12/16/2014   . Dysmetabolic syndrome 96/78/9381  . Kidney stones 12/16/2014  . PVC (premature ventricular contraction) 12/16/2014  . Snoring 12/16/2014  . Stress incontinence 12/16/2014  . Fast heart beat 12/16/2014  . Calculus (=stone) 12/16/2014  . OSA (obstructive sleep apnea) 11/27/2014  . Dyspnea 10/07/2014  . Essential hypertension 10/07/2014  . Obesity 10/07/2014  . Genital warts 08/02/2014  . Condyloma acuminatum of vulva 08/02/2014  . Condylomata lata of vulva 08/02/2014  . Anticoagulation adequate 08/02/2014  . Chronic atrial fibrillation ([place]) 08/02/2014  . Current tobacco use 08/02/2014  . 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 Malignant neoplasm of vulva ([place]) 06/19/2014  . Vulvar cancer ([place]) 05/10/2014  . Vulvar cancer, carcinoma ([place]) 05/10/2014  . 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 Chest pain 08/20/2013  . Breath shortness 08/20/2013  . CCF (congestive cardiac failure) ([place]) 08/03/2013  . CHF (congestive heart failure) ([place]) 08/03/2013  . H/O transesophageal echocardiography (TEE) for monitoring 06/21/2013  . Echocardiogram abnormal 04/23/2013  . Mitral regurgitation 04/23/2013  . TI (tricuspid incompetence) 04/23/2013    Past Medical History: Past Medical History:  Diagnosis Date  . A-fib ([place])   . Abnormal Pap smear of vagina and vaginal HPV   . Acute diastolic CHF (congestive heart failure) ([place]) 01/21/2015  . Acute diverticulitis 01/28/2015  . Acute on chronic diastolic heart failure ([place]) 01/21/2015  . Anticoagulation adequate 08/02/2014   Overview:  Xarelto 20hs; CHADS=1+ htn, female, age  Overview:  Xarelto 20hs; CHADS=1+ htn, female, age  . Arthritis    "bones ache" (01/23/2015)  . Atrial fibrillation, persistent ([place]) 08/13/2015   Overview:  symptomatic (fatigue, DOE, decreased exercise tolerance, palpitations), drug refractory (propafenone, amiodarone), complicated recovery from DCCV possibly related to repiratory obstruction [facility]  (patient fear of DCCV)  Overview:  symptomatic (fatigue, DOE, decreased exercise tolerance, palpitations), drug refractory (propafenone, amiodarone), complicated recovery from DCCV possibly relat  . Benign neoplasm of ascending colon   . Benign neoplasm of descending colon   . Benign neoplasm of sigmoid colon   . Blood in stool 01/21/2015  . Breath shortness 08/20/2013  . CAFL (chronic airflow limitation) ([place]) 12/16/2014  . Cancer ([place])    vulvar cancer  . CCF (congestive cardiac failure) ([place]) 08/03/2013   Overview:  ACUTE SYSTOLIC   . Chest pain 08/20/2013  . CHF (congestive heart failure) ([place])   . CHF (congestive heart failure) ([place])   . Chronic anticoagulation    She is on Xarelto for Afib  . Chronic atrial fibrillation ([place])   . Chronic combined systolic and diastolic CHF, NYHA class 3 ([place]) 01/28/2015  . Chronic diastolic heart failure ([place]) 05/05/2015  . Condyloma acuminatum of vulva 08/02/2014  . COPD (chronic obstructive pulmonary disease) ([place]) 08/13/2015  . Diverticulitis large intestine w/o perforation or abscess w/bleeding 01/28/2015  . Diverticulitis of large intestine without perforation or abscess with bleeding   . Dysmetabolic syndrome 0/17/5102   Overview:  BG-166 06/15/2013; elevated BMI; central obesity   . Dyspepsia   . Dyspnea   . Echocardiogram abnormal    Mitral regurgitation, tricuspic regurgitation  . Essential hypertension 10/07/2014   Trial off acei 10/07/2014  Due to atypical sob (talking =  walking)> no real change 11/18/2014    . Family history of adverse reaction to anesthesia    sister gets very nauseated and vomits  . Gastritis without bleeding   . GERD (gastroesophageal reflux disease)   . History of kidney stones   . History of stress test    Myoview 8/16: EF 42%, anteroseptal, inferoseptal, anterior, apical anterior and apical defect (likely represents breast attenuation versus scar), no ischemia; intermediate risk because of low EF  . HPV (human papilloma  virus) anogenital infection    03/21/12 PAP + HR HPV  . HTN (hypertension) 12/16/2014  . Hypertension   . Hypertensive heart disease 05/12/2016  . Hypertensive urgency 01/21/2015  . Insulin resistance   . Kidney stone   . Kidney stones   . Malignant neoplasm of vulva ([place]) 06/19/2014   Overview:  Ms NORITA MEIGS is a 72 yo who was referred by Dr Ouida Sills for a vulvar lesion. 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 The specimen had negative margins, but was within 2 mm of the cancer.    . Mitral regurgitation 04/23/2013   Overview:  "Moderate to severe" by TTE, "mild to mod" by TEE  Overview:  "Moderate to severe" by TTE, "mild to mod" by TEE  . Obesity   . OSA (obstructive sleep apnea) 11/27/2014   07/2013 RDI 17/h CPAP 8   . OSA on CPAP   . Other specified diseases of esophagus   . Postoperative nausea and vomiting 1983   many years ago with hysterectomy  . PVC (premature ventricular contraction) 12/16/2014  . Sepsis ([place]) 12/13/2016  . TI (tricuspid incompetence) 04/23/2013   Overview:  "moderate" by TTE   . Urolithiasis   . Vertigo   . Vulvar cancer ([place]) 2016   invasive verrucous carcinoma of the vulva. History of genital warts and vulvar condyloma  . Vulvar cancer, carcinoma ([place]) 05/10/2014    Past Surgical History: Past Surgical History:  Procedure Laterality Date  . ABDOMINAL HYSTERECTOMY    . APPENDECTOMY    . BILATERAL SALPINGOOPHORECTOMY  2003   benign ovarian cancer  . CARDIOVERSION  2014-2015   "[place]"  . COLONOSCOPY WITH PROPOFOL N/A 06/10/2015   Procedure: COLONOSCOPY WITH PROPOFOL;  Surgeon: Lucilla Lame, MD;  Location: [facility] ENDOSCOPY;  Service: Endoscopy;  Laterality: N/A;  . CYSTOSCOPY W/ RETROGRADES Bilateral 10/19/2016   Procedure: CYSTOSCOPY WITH RETROGRADE PYELOGRAM;  Surgeon: Hollice Espy, MD;  Location: [facility] ORS;  Service: Urology;  Laterality: Bilateral;  .  CYSTOSCOPY W/ URETERAL STENT PLACEMENT Left 12/29/2016   Procedure: CYSTOSCOPY WITH STENT REPLACEMENT;  Surgeon: Hollice Espy, MD;  Location: [facility] ORS;  Service: Urology;  Laterality: Left;  . CYSTOSCOPY WITH BIOPSY N/A 10/19/2016   Procedure: CYSTOSCOPY WITH BLADDER BIOPSY, VAGINAL WALL BIOPSY;  Surgeon: Hollice Espy, MD;  Location: [facility] ORS;  Service: Urology;  Laterality: N/A;  . CYSTOSCOPY WITH STENT PLACEMENT Left 12/14/2016   Procedure: CYSTOSCOPY WITH STENT PLACEMENT;  Surgeon: Hollice Espy, MD;  Location: [facility] ORS;  Service: Urology;  Laterality: Left;  . ESOPHAGOGASTRODUODENOSCOPY (EGD) WITH PROPOFOL N/A 10/05/2016   Surgeon: Lucilla Lame, MD;  Location: [facility] ENDOSCOPY;  Results: Barrett's Esophagus- repeat in 3 years 09/2019  . LITHOTRIPSY    . STONE EXTRACTION WITH BASKET Left 12/29/2016   Procedure: STONE EXTRACTION WITH BASKET;  Surgeon: Hollice Espy, MD;  Location: [facility] ORS;  Service: Urology;  Laterality: Left;  . TONSILLECTOMY    .  URETEROSCOPY Left 12/29/2016   Procedure: URETEROSCOPY;  Surgeon: Hollice Espy, MD;  Location: [facility] ORS;  Service: Urology;  Laterality: Left;  Marland Kitchen VULVECTOMY Right 05/10/2014   Excisional biopsy of the superior right labial majora mass; [place] PARTIAL  07/02/2014   Re-excision and sentinel node dissection [facility].     Family History: Family History  Problem Relation Age of Onset  . Prostate cancer Brother 70       still living and well  . Heart attack Father   . Stroke Mother   . Diabetes Mother   . Hypertension Mother   . Heart attack Brother   . Pancreatic cancer Brother   . Heart disease Sister   . Breast cancer Neg Hx   . Kidney cancer Neg Hx   . Bladder Cancer Neg Hx     Social History: Social History   Socioeconomic History  . Marital status: Divorced    Spouse name: Not on file  . Number of children: 1  . Years of education: GED  . Highest education level: 12th grade  Occupational  History  . Occupation: Retired  Scientific laboratory technician  . Financial resource strain: Not hard at all  . Food insecurity:    Worry: Never true    Inability: Never true  . Transportation needs:    Medical: No    Non-medical: No  Tobacco Use  . Smoking status: Former Smoker    Packs/day: 1.00    Years: 50.00    Pack years: 50.00    Types: Cigarettes    Last attempt to quit: 05/20/2012    Years since quitting: 5.5  . Smokeless tobacco: Never Used  . Tobacco comment: smoking cessation materials not required  Substance and Sexual Activity  . Alcohol use: No    Alcohol/week: 0.0 standard drinks  . Drug use: No  . Sexual activity: Not Currently  Lifestyle  . Physical activity:    Days per week: 2 days    Minutes per session: 60 min  . Stress: Not at all  Relationships  . Social connections:    Talks on phone: More than three times a week    Gets together: Once a week    Attends religious service: 1 to 4 times per year    Active member of club or organization: No    Attends meetings of clubs or organizations: Never    Relationship status: Divorced  . Intimate partner violence:    Fear of current or ex partner: No    Emotionally abused: No    Physically abused: No    Forced sexual activity: No  Other Topics Concern  . Not on file  Social History Narrative  . Not on file    Allergies: Allergies  Allergen Reactions  . Ciprofloxacin Other (See Comments)    SOB    Current Medications: Current Outpatient Medications  Medication Sig Dispense Refill  . acetaminophen (TYLENOL) 325 MG tablet Take 2 tablets (650 mg total) by mouth every 6 (six) hours as needed for mild pain (or Fever >/= 101).    Marland Kitchen albuterol (PROVENTIL HFA;VENTOLIN HFA) 108 (90 Base) MCG/ACT inhaler Inhale 2 puffs into the lungs every 8 (eight) hours as needed for wheezing or shortness of breath. 1 Inhaler 6  . clobetasol ointment (TEMOVATE) 2.20 % Apply 1 application topically at bedtime. To affected area 30 g 1  .  furosemide (LASIX) 40 MG tablet TAKE 2 TABLETS (80 MG TOTAL) BY MOUTH DAILY. [place]  tablet 12  . irbesartan (AVAPRO) 75 MG tablet TAKE 1 TABLET BY MOUTH DAILY. PATIENT NEEDS AN APPOINTMENT FOR FURTHER REFILLS 3RD/FINAL ATTEMPT (Patient taking differently: 37.5 mg. ) 90 tablet 1  . metoprolol succinate (TOPROL-XL) 50 MG 24 hr tablet Take 1 tablet (50 mg total) by mouth 2 (two) times daily. 180 tablet 3  . metoprolol tartrate (LOPRESSOR) 100 MG tablet TAKE 1 TABLET BY MOUTH TWICE A DAY 180 tablet 3  . omeprazole (PRILOSEC) 20 MG capsule TAKE 1 CAPSULE (20 MG TOTAL) BY MOUTH ONCE DAILY. 90 capsule 0  . potassium chloride SA (K-DUR,KLOR-CON) 20 MEQ tablet Take 1 tablet (20 mEq total) by mouth daily. 90 tablet 3  . triamcinolone (NASACORT ALLERGY 24HR) 55 MCG/ACT AERO nasal inhaler Place 2 sprays into the nose daily as needed.     . vitamin B-12 (CYANOCOBALAMIN) 1000 MCG tablet Take 1,000 mcg by mouth every other day.     Alveda Reasons 20 MG TABS tablet TAKE 1 TABLET (20 MG TOTAL) BY MOUTH DAILY WITH SUPPER. 90 tablet 1   Current Facility-Administered Medications  Medication Dose Route Frequency Provider Last Rate Last Dose  . albuterol (PROVENTIL) (2.5 MG/3ML) 0.083% nebulizer solution 2.5 mg  2.5 mg Nebulization Once Juline Patch, MD        Review of Systems General:  Chronic fatigue Skin: no complaints Eyes: no complaints HEENT: no complaints Breasts: no complaints Pulmonary: chronic sob, unchanged Cardiac: on Xarelto Gastrointestinal: no complaints Genitourinary/Sexual: hx of nephrolithiasis w/ sepsis (11/2016) & overactive bladder (managed by Dr. Erlene Quan; now off Myrbetriq) Ob/Gyn: no complaints Musculoskeletal: no complaints Hematology: no complaints Neurologic/Psych: no complaints   Objective:  Physical Examination:  BP 118/70   Pulse 66   Temp 98.1 F (36.7 C) (Oral)   Resp 18   Ht 5' (1.524 m)   Wt 222 lb 8 oz (100.9 kg)   BMI 43.45 kg/m   ECOG Performance Status: 0 -  Asymptomatic  GENERAL: Patient is a well appearing female in no acute distress HEENT:  Sclerae anicteric.  Oropharynx clear and moist. No ulcerations or evidence of oropharyngeal candidiasis. Neck is supple.  NODES:  No cervical, supraclavicular, or axillary lymphadenopathy palpated.  LUNGS:  Clear to auscultation bilaterally.  No wheezes or rhonchi. HEART:  Irregularly irregular rhythm.  ABDOMEN:  Soft, nontender.  Positive, normoactive bowel sounds MSK:  No focal spinal tenderness to palpation. Full range of motion bilaterally in the upper extremities. EXTREMITIES:  Trace bilateral lower extremity edema (per pt baseline) SKIN:  Clear with no obvious rashes or skin changes. No nail dyscrasia. NEURO:  Nonfocal. Well oriented.  Appropriate affect.  Pelvic: exam chaperoned by nurse;  Vulva: s/p partial right vulvectomy; white flat lesion on right vulva at 9:00. Vagina: normal vagina; Cervix, Adnexa, Uterus: surgically absent; Rectal: normal rectal, no masses.  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  NED Lichen sclerosis left vulva, but not symptomatic.    Plan:   Problem List Items Addressed This Visit      Other   Vulvar cancer ([place]) - Primary     Vulvar care was discussed. The patient is to call with heavy bleeding, fever greater than 100.4, foul smelling vaginal discharge or other concerns.   She can follow up with Dr Erlene Quan in Urology as needed.   We recommend topical corticosteroid regimen for vulvar lichen planus  with nightly application of clobetasol propionate 0.05% ointment to the affected area. "As a guide, half of one finger-tip-unit (the amount of ointment expressed from a tube with a nozzle of 5 mm in diameter, applied from the distal skin-crease to the tip of the index finger) should be sufficient to cover both labia majora and minora  and the perianal skin." Apply every other night to the area and start taper next year   Beckey Rutter, [place], AGNP-C [place] [facility] [place] (work cell) [phone] (office)  Mellody Drown, MD

## 2017-11-30 NOTE — Progress Notes (Signed)
She states that she uses the cream 3 times a week, she forgets to do it every day. She is tired and hurting today from moving all the kitchen stuff out of cabinets because she is getting it redone next week

## 2017-12-02 ENCOUNTER — Telehealth: Payer: Self-pay | Admitting: *Deleted

## 2017-12-02 NOTE — Telephone Encounter (Signed)
Patient called r/t referral for LDCT screening.  Discussed importance of the scan with the patient and encouraged them to reconsider.  Patient voiced not wanting to have this done at this time. Patient stated "I have had enough medical things done and I am not doing anymore."  Patient instructed to call Burgess Estelle RN at [phone] if they have further questions or if they changed their mind and would like to proceed with the screening.  Patient voiced understanding.

## 2017-12-19 ENCOUNTER — Telehealth: Payer: Self-pay | Admitting: Interventional Cardiology

## 2017-12-19 NOTE — Telephone Encounter (Signed)
Returned call to patient. Made her aware that she should try an stay away from her grandson until the quarantine period is over. Encouraged frequent hand washing and instructed patient to be seen by PCP if she developed any S/Sx. Patient verbalized understanding and thanked me for the call.

## 2017-12-19 NOTE — Telephone Encounter (Signed)
New Message:      Pt says she have been around her grandson who has Hoof and Mouth Disease. She wants to know what she should do, especially with her condition? She says it is highly contagious.

## 2017-12-24 ENCOUNTER — Other Ambulatory Visit: Payer: Self-pay | Admitting: Interventional Cardiology

## 2018-01-10 ENCOUNTER — Other Ambulatory Visit: Payer: Self-pay | Admitting: Interventional Cardiology

## 2018-01-17 ENCOUNTER — Other Ambulatory Visit: Payer: Self-pay | Admitting: Interventional Cardiology

## 2018-02-20 ENCOUNTER — Other Ambulatory Visit: Payer: Self-pay | Admitting: Gastroenterology

## 2018-02-22 ENCOUNTER — Ambulatory Visit: Payer: Medicare HMO

## 2018-03-02 ENCOUNTER — Other Ambulatory Visit: Payer: Self-pay | Admitting: Gastroenterology

## 2018-03-21 ENCOUNTER — Telehealth: Payer: Self-pay | Admitting: Interventional Cardiology

## 2018-03-21 DIAGNOSIS — E782 Mixed hyperlipidemia: Secondary | ICD-10-CM

## 2018-03-21 NOTE — Telephone Encounter (Signed)
New Message   Pt would like to have her Lipid blood work done at [facility] please put in order in [employer]

## 2018-03-21 NOTE — Telephone Encounter (Signed)
Called and made patient aware that order for Fasting Lipids to be done at [facility] has been placed. Patient aware that this will be due in February. Patient verbalized understanding and was thankful for the call.

## 2018-03-27 DIAGNOSIS — Z6841 Body Mass Index (BMI) 40.0 and over, adult: Secondary | ICD-10-CM | POA: Diagnosis not present

## 2018-03-27 DIAGNOSIS — R32 Unspecified urinary incontinence: Secondary | ICD-10-CM | POA: Diagnosis not present

## 2018-03-27 DIAGNOSIS — Z8249 Family history of ischemic heart disease and other diseases of the circulatory system: Secondary | ICD-10-CM | POA: Diagnosis not present

## 2018-03-27 DIAGNOSIS — I4891 Unspecified atrial fibrillation: Secondary | ICD-10-CM | POA: Diagnosis not present

## 2018-03-27 DIAGNOSIS — Z7901 Long term (current) use of anticoagulants: Secondary | ICD-10-CM | POA: Diagnosis not present

## 2018-03-27 DIAGNOSIS — I110 Hypertensive heart disease with heart failure: Secondary | ICD-10-CM | POA: Diagnosis not present

## 2018-03-27 DIAGNOSIS — K219 Gastro-esophageal reflux disease without esophagitis: Secondary | ICD-10-CM | POA: Diagnosis not present

## 2018-03-27 DIAGNOSIS — Z809 Family history of malignant neoplasm, unspecified: Secondary | ICD-10-CM | POA: Diagnosis not present

## 2018-03-27 DIAGNOSIS — R69 Illness, unspecified: Secondary | ICD-10-CM | POA: Diagnosis not present

## 2018-03-27 DIAGNOSIS — I509 Heart failure, unspecified: Secondary | ICD-10-CM | POA: Diagnosis not present

## 2018-03-29 ENCOUNTER — Other Ambulatory Visit: Payer: Self-pay | Admitting: Gastroenterology

## 2018-03-29 IMAGING — MG MM DIGITAL SCREENING BILAT W/ CAD
6 series · 6 of 6 positions shown · non-contrast
Comparison: Previous exam(s).

CLINICAL DATA: Screening.

EXAM:
DIGITAL SCREENING BILATERAL MAMMOGRAM WITH CAD

[R CC (1 of 2)]
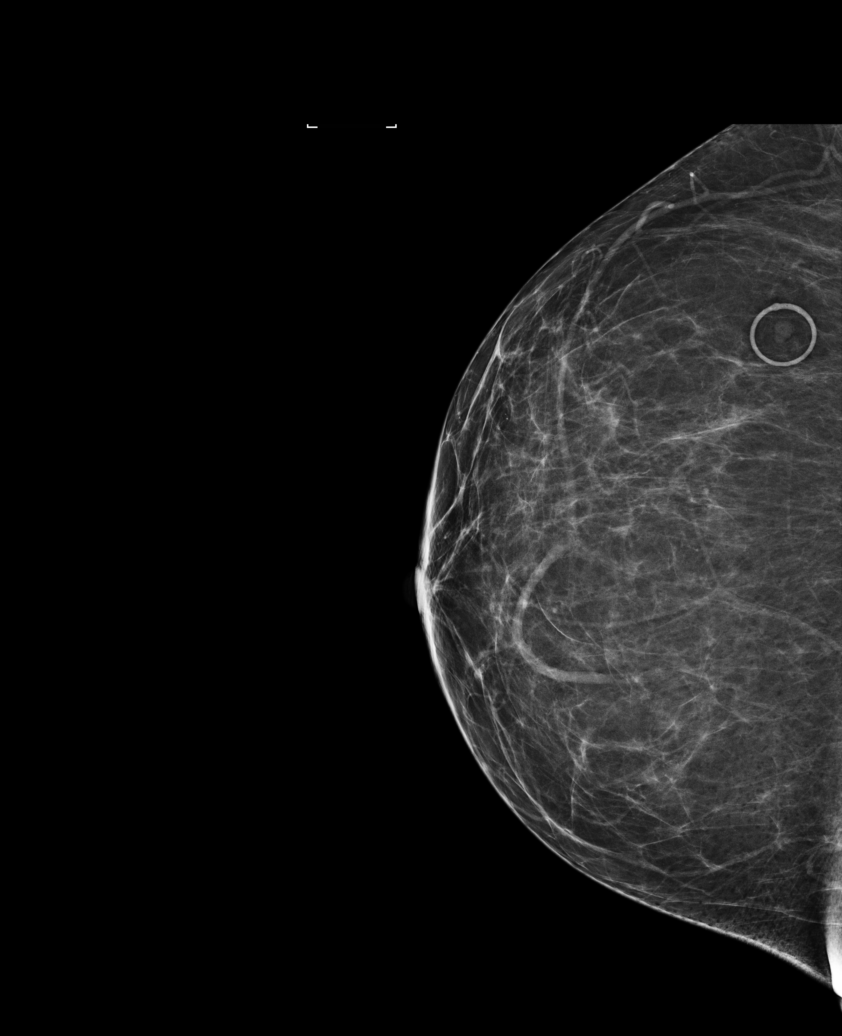

[L CC (1 of 2)]
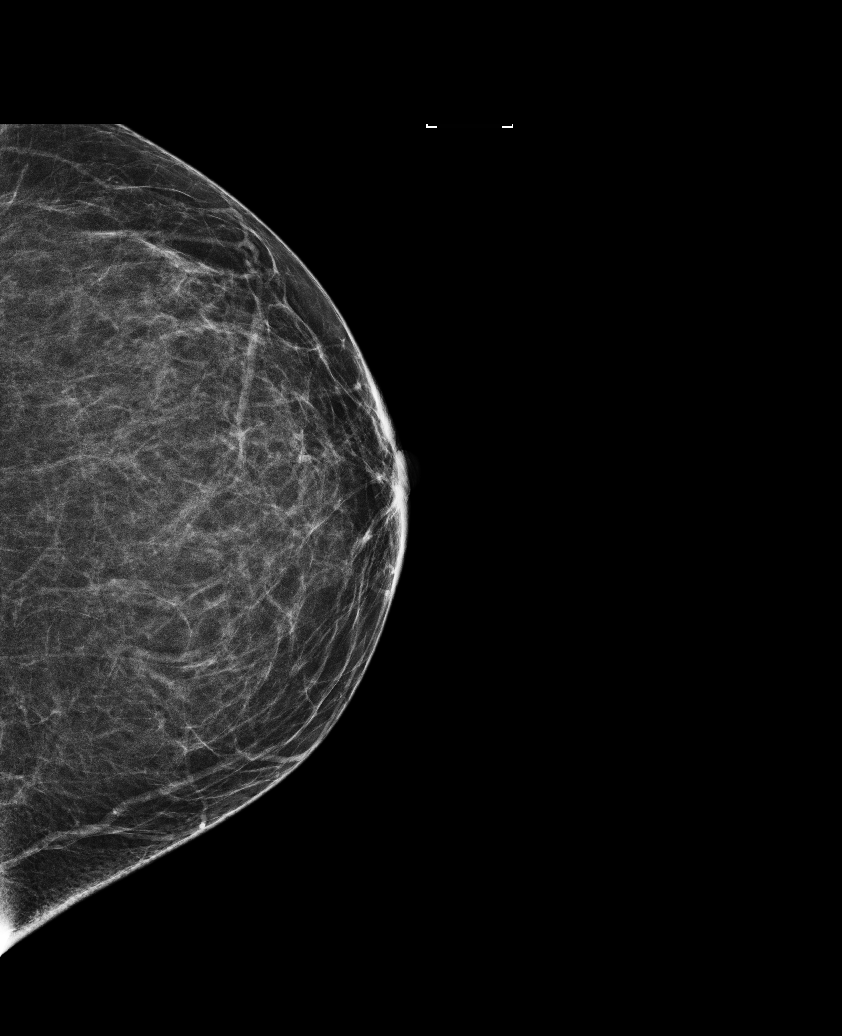

[R MLO]
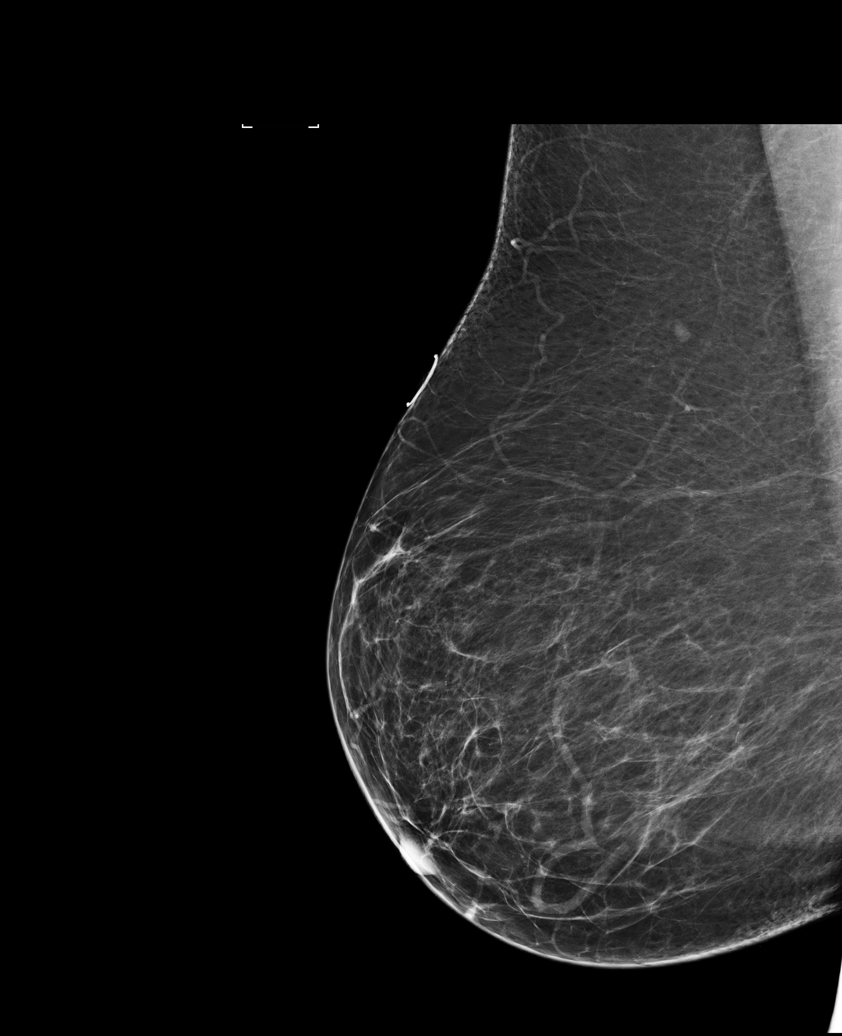

[L CC (2 of 2)]
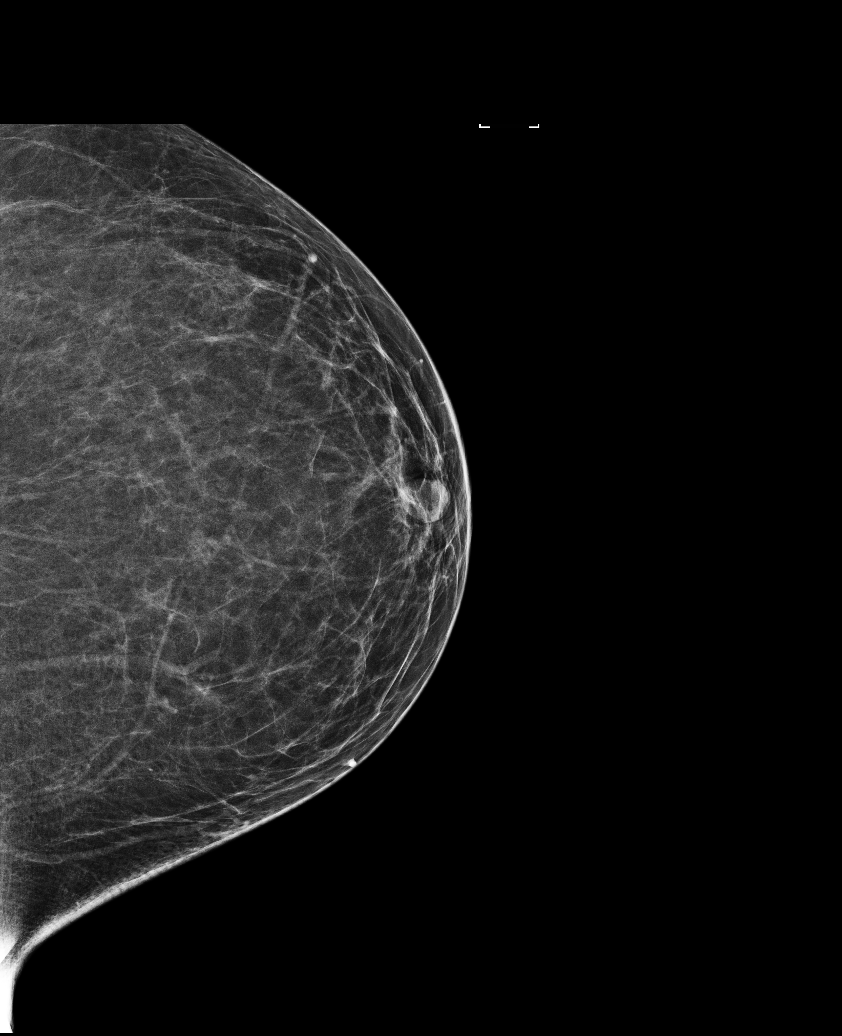

[R CC (2 of 2)]
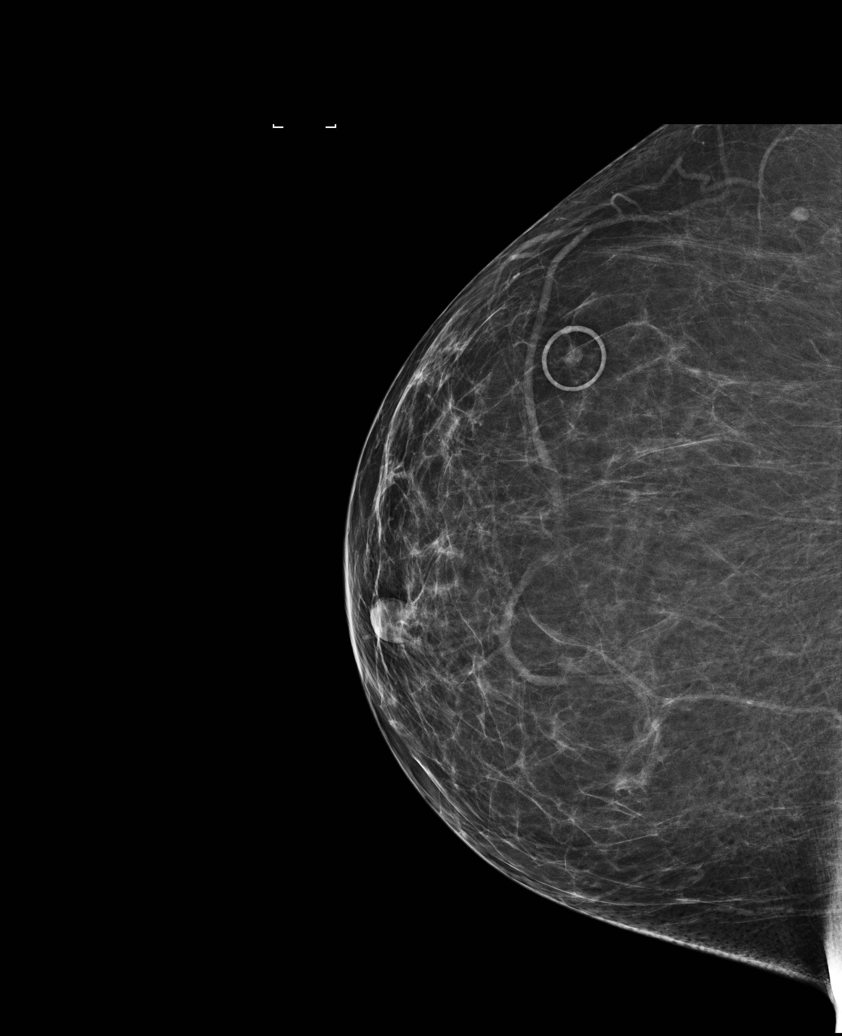

[L MLO]
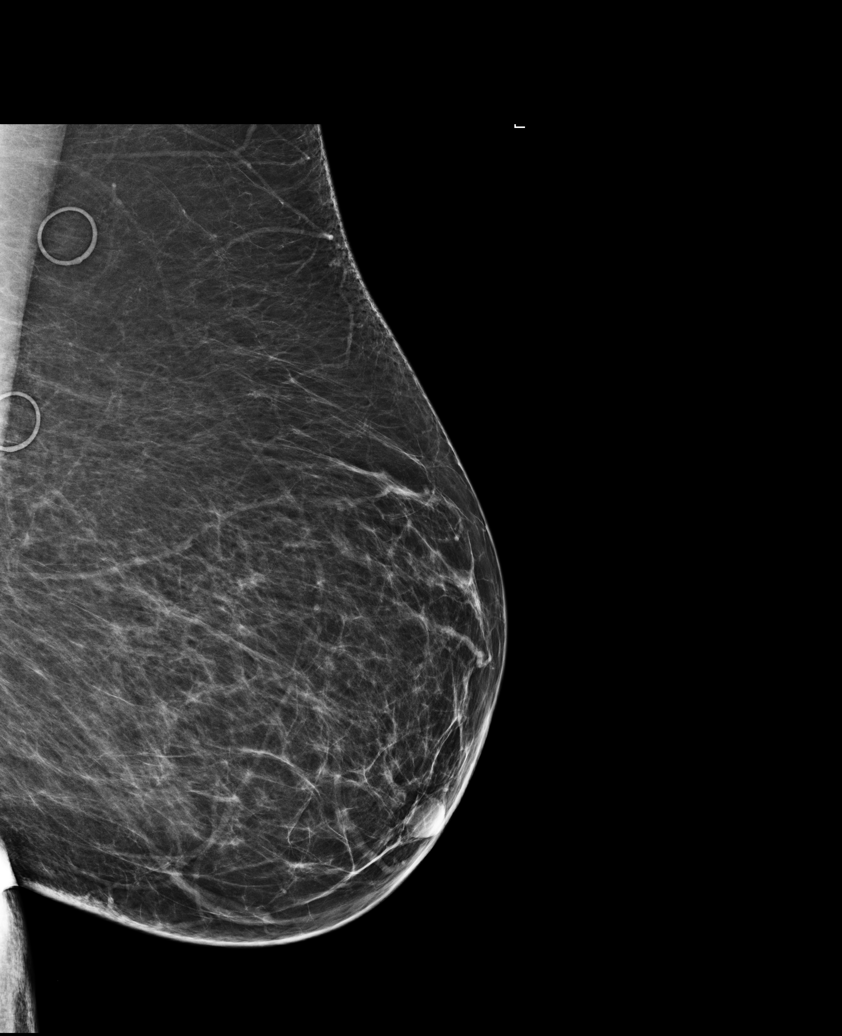

[6 of 6 positions shown; findings below may reference images not displayed]

ACR Breast Density Category b: There are scattered areas of
fibroglandular density.
FINDINGS: There are no findings suspicious for malignancy. Images were
processed with CAD.
IMPRESSION: No mammographic evidence of malignancy. A result letter of this
screening mammogram will be mailed directly to the patient.

RECOMMENDATION:
Screening mammogram in one year. (Code:AS-G-LCT)

BI-RADS CATEGORY  1: Negative.

## 2018-03-31 ENCOUNTER — Other Ambulatory Visit: Payer: Self-pay | Admitting: Family Medicine

## 2018-03-31 DIAGNOSIS — Z1231 Encounter for screening mammogram for malignant neoplasm of breast: Secondary | ICD-10-CM

## 2018-04-04 ENCOUNTER — Ambulatory Visit: Payer: Medicare HMO | Admitting: Gastroenterology

## 2018-04-04 ENCOUNTER — Encounter: Payer: Self-pay | Admitting: Gastroenterology

## 2018-04-04 VITALS — BP 127/67 | HR 78 | Ht 60.0 in | Wt 222.6 lb

## 2018-04-04 DIAGNOSIS — K219 Gastro-esophageal reflux disease without esophagitis: Secondary | ICD-10-CM | POA: Diagnosis not present

## 2018-04-04 DIAGNOSIS — K2270 Barrett's esophagus without dysplasia: Secondary | ICD-10-CM

## 2018-04-04 MED ORDER — OMEPRAZOLE 20 MG PO CPDR: DELAYED_RELEASE_CAPSULE | ORAL | 11 refills | Status: DC

## 2018-04-04 NOTE — Progress Notes (Signed)
Primary Care Physician: Juline Patch, MD  Primary Gastroenterologist:  Dr. Lucilla Lame  Chief Complaint  Patient presents with  . Medication Refill    HPI: Holly Smith is a 73 y.o. female here for follow-up of heartburn and Barrett's esophagus.  The patient reports that she has been doing well on her omeprazole.  She does report significant symptoms when she stops her omeprazole.  There is no report of any dysphagia or unexplained weight loss.  She denies any black stools or bloody stools.  Current Outpatient Medications  Medication Sig Dispense Refill  . acetaminophen (TYLENOL) 325 MG tablet Take 2 tablets (650 mg total) by mouth every 6 (six) hours as needed for mild pain (or Fever >/= 101).    . furosemide (LASIX) 40 MG tablet Take 2 tablets (80 mg total) by mouth daily. 180 tablet 2  . irbesartan (AVAPRO) 75 MG tablet Take 1 tablet (75 mg total) by mouth daily. 90 tablet 2  . metoprolol succinate (TOPROL-XL) 50 MG 24 hr tablet Take 1 tablet (50 mg total) by mouth 2 (two) times daily. 180 tablet 3  . metoprolol tartrate (LOPRESSOR) 100 MG tablet TAKE 1 TABLET BY MOUTH TWICE A DAY 180 tablet 3  . omeprazole (PRILOSEC) 20 MG capsule TAKE 1 CAPSULE BY MOUTH EVERY DAY 30 capsule 1  . potassium chloride SA (K-DUR,KLOR-CON) 20 MEQ tablet Take 1 tablet (20 mEq total) by mouth daily. 90 tablet 3  . triamcinolone (NASACORT ALLERGY 24HR) 55 MCG/ACT AERO nasal inhaler Place 2 sprays into the nose daily as needed.     . vitamin B-12 (CYANOCOBALAMIN) 1000 MCG tablet Take 1,000 mcg by mouth every other day.     Alveda Reasons 20 MG TABS tablet TAKE 1 TABLET (20 MG TOTAL) BY MOUTH DAILY WITH SUPPER. 90 tablet 1  . albuterol (PROVENTIL HFA;VENTOLIN HFA) 108 (90 Base) MCG/ACT inhaler Inhale 2 puffs into the lungs every 8 (eight) hours as needed for wheezing or shortness of breath. (Patient not taking: Reported on 04/04/2018) 1 Inhaler 6  . clobetasol ointment (TEMOVATE) 3.79 % Apply 1  application topically at bedtime. To affected area (Patient not taking: Reported on 04/04/2018) 30 g 1   Current Facility-Administered Medications  Medication Dose Route Frequency Provider Last Rate Last Dose  . albuterol (PROVENTIL) (2.5 MG/3ML) 0.083% nebulizer solution 2.5 mg  2.5 mg Nebulization Once Juline Patch, MD        Allergies as of 04/04/2018 - Review Complete 04/04/2018  Allergen Reaction Noted  . Ciprofloxacin Other (See Comments) 02/04/2015    ROS:  General: Negative for anorexia, weight loss, fever, chills, fatigue, weakness. ENT: Negative for hoarseness, difficulty swallowing , nasal congestion. CV: Negative for chest pain, angina, palpitations, dyspnea on exertion, peripheral edema.  Respiratory: Negative for dyspnea at rest, dyspnea on exertion, cough, sputum, wheezing.  GI: See history of present illness. GU:  Negative for dysuria, hematuria, urinary incontinence, urinary frequency, nocturnal urination.  Endo: Negative for unusual weight change.    Physical Examination:   BP 127/67   Pulse 78   Ht 5' (1.524 m)   Wt 222 lb 9.6 oz (101 kg)   BMI 43.47 kg/m   General: Well-nourished, well-developed in no acute distress.  Eyes: No icterus. Conjunctivae pink. Mouth: Oropharyngeal mucosa moist and pink , no lesions erythema or exudate. Lungs: Clear to auscultation bilaterally. Non-labored. Heart: Regular rate and rhythm, no murmurs rubs or gallops.  Abdomen: Bowel sounds are normal, nontender, nondistended, no hepatosplenomegaly  or masses, no abdominal bruits or hernia , no rebound or guarding.   Extremities: No lower extremity edema. No clubbing or deformities. Neuro: Alert and oriented x 3.  Grossly intact. Skin: Warm and dry, no jaundice.   Psych: Alert and cooperative, normal mood and affect.  Labs:    Imaging Studies: No results found.  Assessment and Plan:   Holly Smith is a 73 y.o. y/o female who has a history of Barrett's esophagus.   The patient's last biopsies confirm Barrett's esophagus but did not show any signs of high-grade dysplasia.  The patient is here for a refill of her medications and for follow-up.  The patient will have her omeprazole refilled and will have a repeat endoscopy 3 years after her last one.  The patient has been explained the plan and agrees with it.    Lucilla Lame, MD. Marval Regal   Note: This dictation was prepared with Dragon dictation along with smaller phrase technology. Any transcriptional errors that result from this process are unintentional.

## 2018-04-24 ENCOUNTER — Other Ambulatory Visit
Admission: RE | Admit: 2018-04-24 | Discharge: 2018-04-24 | Disposition: A | Discharge status: Home / Self Care | Payer: Medicare HMO | Source: Ambulatory Visit | Attending: Interventional Cardiology | Admitting: Interventional Cardiology

## 2018-04-24 ENCOUNTER — Ambulatory Visit
Admission: RE | Admit: 2018-04-24 | Discharge: 2018-04-24 | Disposition: A | Discharge status: Home / Self Care | Payer: Medicare HMO | Source: Ambulatory Visit | Attending: Family Medicine | Admitting: Family Medicine

## 2018-04-24 DIAGNOSIS — Z1231 Encounter for screening mammogram for malignant neoplasm of breast: Secondary | ICD-10-CM | POA: Insufficient documentation

## 2018-04-24 DIAGNOSIS — E782 Mixed hyperlipidemia: Secondary | ICD-10-CM

## 2018-04-24 DIAGNOSIS — E876 Hypokalemia: Secondary | ICD-10-CM

## 2018-04-24 LAB — BASIC METABOLIC PANEL
ANION GAP: 8 (ref 5–15)
BUN: 18 mg/dL (ref 8–23)
CHLORIDE: 101 mmol/L (ref 98–111)
CO2: 31 mmol/L (ref 22–32)
Calcium: 9.1 mg/dL (ref 8.9–10.3)
Creatinine, Ser: 0.95 mg/dL (ref 0.44–1.00)
GFR calc non Af Amer: 59 mL/min — ABNORMAL LOW (ref >60)
Glucose, Bld: 170 mg/dL — ABNORMAL HIGH (ref 70–99)
POTASSIUM: 3.6 mmol/L (ref 3.5–5.1)
Sodium: 140 mmol/L (ref 135–145)

## 2018-04-24 LAB — LIPID PANEL
Cholesterol: 171 mg/dL (ref 0–200)
HDL: 34 mg/dL — ABNORMAL LOW (ref >40)
LDL Cholesterol: 113 mg/dL — ABNORMAL HIGH (ref 0–99)
Total CHOL/HDL Ratio: 5 RATIO
Triglycerides: 119 mg/dL (ref <150)
VLDL: 24 mg/dL (ref 0–40)

## 2018-05-08 ENCOUNTER — Encounter (INDEPENDENT_AMBULATORY_CARE_PROVIDER_SITE_OTHER): Payer: Self-pay

## 2018-05-08 ENCOUNTER — Ambulatory Visit: Payer: Medicare HMO | Admitting: Cardiology

## 2018-05-08 ENCOUNTER — Encounter: Payer: Self-pay | Admitting: Cardiology

## 2018-05-08 VITALS — BP 120/70 | HR 75 | Ht 60.0 in | Wt 225.2 lb

## 2018-05-08 DIAGNOSIS — I4819 Other persistent atrial fibrillation: Secondary | ICD-10-CM | POA: Diagnosis not present

## 2018-05-08 NOTE — Patient Instructions (Addendum)
Medication Instructions:  NONE If you need a refill on your cardiac medications before your next appointment, please call your pharmacy.   Lab work: NONE If you have labs (blood work) drawn today and your tests are completely normal, you will receive your results only by: Holly Smith MyChart Message (if you have MyChart) OR . A paper copy in the mail If you have any lab test that is abnormal or we need to change your treatment, we will call you to review the results.  Testing/Procedures: NONE  Follow-Up: At [facility], you and your health needs are our priority.  As part of our continuing mission to provide you with exceptional heart care, we have created designated Provider Care Teams.  These Care Teams include your primary Cardiologist (physician) and Advanced Practice Providers (APPs -  Physician Assistants and Nurse Practitioners) who all work together to provide you with the care you need, when you need it. You will need a follow up appointment in 6 months.  Please call our office 2 months in advance to schedule this appointment.  You may see Larae Grooms, MD or one of the following Advanced Practice Providers on your designated Care Team:   [place], PA-C Melina Copa, PA-C . Ermalinda Barrios, PA-C  Any Other Special Instructions Will Be Listed Below (If Applicable).  Check weight daily.. If you gain more than 3 lbs in 1 day or 5 lbs in 1 WEEK, TAKE an extra LASIX and POTASSIUM.Holly Smith  CALL our office. DASH Eating Plan DASH stands for "Dietary Approaches to Stop Hypertension." The DASH eating plan is a healthy eating plan that has been shown to reduce high blood pressure (hypertension). It may also reduce your risk for type 2 diabetes, heart disease, and stroke. The DASH eating plan may also help with weight loss. What are tips for following this plan?  General guidelines  Avoid eating more than 2,300 mg (milligrams) of salt (sodium) a day. If you have hypertension, you may need to  reduce your sodium intake to 1,500 mg a day.  Limit alcohol intake to no more than 1 drink a day for nonpregnant women and 2 drinks a day for men. One drink equals 12 oz of beer, 5 oz of wine, or 1 oz of hard liquor.  Work with your health care provider to maintain a healthy body weight or to lose weight. Ask what an ideal weight is for you.  Get at least 30 minutes of exercise that causes your heart to beat faster (aerobic exercise) most days of the week. Activities may include walking, swimming, or biking.  Work with your health care provider or diet and nutrition specialist (dietitian) to adjust your eating plan to your individual calorie needs. Reading food labels   Check food labels for the amount of sodium per serving. Choose foods with less than 5 percent of the Daily Value of sodium. Generally, foods with less than 300 mg of sodium per serving fit into this eating plan.  To find whole grains, look for the word "whole" as the first word in the ingredient list. Shopping  Buy products labeled as "low-sodium" or "no salt added."  Buy fresh foods. Avoid canned foods and premade or frozen meals. Cooking  Avoid adding salt when cooking. Use salt-free seasonings or herbs instead of table salt or sea salt. Check with your health care provider or pharmacist before using salt substitutes.  Do not fry foods. Cook foods using healthy methods such as baking, boiling, grilling, and broiling  instead.  Cook with heart-healthy oils, such as olive, canola, soybean, or sunflower oil. Meal planning  Eat a balanced diet that includes: ? 5 or more servings of fruits and vegetables each day. At each meal, try to fill half of your plate with fruits and vegetables. ? Up to 6-8 servings of whole grains each day. ? Less than 6 oz of lean meat, poultry, or fish each day. A 3-oz serving of meat is about the same size as a deck of cards. One egg equals 1 oz. ? 2 servings of low-fat dairy each day. ? A  serving of nuts, seeds, or beans 5 times each week. ? Heart-healthy fats. Healthy fats called Omega-3 fatty acids are found in foods such as flaxseeds and coldwater fish, like sardines, salmon, and mackerel.  Limit how much you eat of the following: ? Canned or prepackaged foods. ? Food that is high in trans fat, such as fried foods. ? Food that is high in saturated fat, such as fatty meat. ? Sweets, desserts, sugary drinks, and other foods with added sugar. ? Full-fat dairy products.  Do not salt foods before eating.  Try to eat at least 2 vegetarian meals each week.  Eat more home-cooked food and less restaurant, buffet, and fast food.  When eating at a restaurant, ask that your food be prepared with less salt or no salt, if possible. What foods are recommended? The items listed may not be a complete list. Talk with your dietitian about what dietary choices are best for you. Grains Whole-grain or whole-wheat bread. Whole-grain or whole-wheat pasta. Brown rice. [place] Morrow. Bulgur. Whole-grain and low-sodium cereals. Pita bread. Low-fat, low-sodium crackers. Whole-wheat flour tortillas. Vegetables Fresh or frozen vegetables (raw, steamed, roasted, or grilled). Low-sodium or reduced-sodium tomato and vegetable juice. Low-sodium or reduced-sodium tomato sauce and tomato paste. Low-sodium or reduced-sodium canned vegetables. Fruits All fresh, dried, or frozen fruit. Canned fruit in natural juice (without added sugar). Meat and other protein foods Skinless chicken or [place]. Ground chicken or [place]. Pork with fat trimmed off. Fish and seafood. Egg whites. Dried beans, peas, or lentils. Unsalted nuts, nut butters, and seeds. Unsalted canned beans. Lean cuts of beef with fat trimmed off. Low-sodium, lean deli meat. Dairy Low-fat (1%) or fat-free (skim) milk. Fat-free, low-fat, or reduced-fat cheeses. Nonfat, low-sodium ricotta or cottage cheese. Low-fat or nonfat yogurt. Low-fat,  low-sodium cheese. Fats and oils Soft margarine without trans fats. Vegetable oil. Low-fat, reduced-fat, or light mayonnaise and salad dressings (reduced-sodium). Canola, safflower, olive, soybean, and sunflower oils. Avocado. Seasoning and other foods Herbs. Spices. Seasoning mixes without salt. Unsalted popcorn and pretzels. Fat-free sweets. What foods are not recommended? The items listed may not be a complete list. Talk with your dietitian about what dietary choices are best for you. Grains Baked goods made with fat, such as croissants, muffins, or some breads. Dry pasta or rice meal packs. Vegetables Creamed or fried vegetables. Vegetables in a cheese sauce. Regular canned vegetables (not low-sodium or reduced-sodium). Regular canned tomato sauce and paste (not low-sodium or reduced-sodium). Regular tomato and vegetable juice (not low-sodium or reduced-sodium). Angie Fava. Olives. Fruits Canned fruit in a light or heavy syrup. Fried fruit. Fruit in cream or butter sauce. Meat and other protein foods Fatty cuts of meat. Ribs. Fried meat. Berniece Salines. Sausage. Bologna and other processed lunch meats. Salami. Fatback. Hotdogs. Bratwurst. Salted nuts and seeds. Canned beans with added salt. Canned or smoked fish. Whole eggs or egg yolks. Chicken or [place] with skin.  Dairy Whole or 2% milk, cream, and half-and-half. Whole or full-fat cream cheese. Whole-fat or sweetened yogurt. Full-fat cheese. Nondairy creamers. Whipped toppings. Processed cheese and cheese spreads. Fats and oils Butter. Stick margarine. Lard. Shortening. Ghee. Bacon fat. Tropical oils, such as coconut, palm kernel, or palm oil. Seasoning and other foods Salted popcorn and pretzels. Onion salt, garlic salt, seasoned salt, table salt, and sea salt. Worcestershire sauce. Tartar sauce. Barbecue sauce. Teriyaki sauce. Soy sauce, including reduced-sodium. Steak sauce. Canned and packaged gravies. Fish sauce. Oyster sauce. Cocktail sauce.  Horseradish that you find on the shelf. Ketchup. Mustard. Meat flavorings and tenderizers. Bouillon cubes. Hot sauce and Tabasco sauce. Premade or packaged marinades. Premade or packaged taco seasonings. Relishes. Regular salad dressings. Where to find more information:  National Heart, Lung, and [place]: https://wilson-eaton.com/  American Heart Association: www.heart.org Summary  The DASH eating plan is a healthy eating plan that has been shown to reduce high blood pressure (hypertension). It may also reduce your risk for type 2 diabetes, heart disease, and stroke.  With the DASH eating plan, you should limit salt (sodium) intake to 2,300 mg a day. If you have hypertension, you may need to reduce your sodium intake to 1,500 mg a day.  When on the DASH eating plan, aim to eat more fresh fruits and vegetables, whole grains, lean proteins, low-fat dairy, and heart-healthy fats.  Work with your health care provider or diet and nutrition specialist (dietitian) to adjust your eating plan to your individual calorie needs. This information is not intended to replace advice given to you by your health care provider. Make sure you discuss any questions you have with your health care provider. Document Released: 02/25/2011 Document Revised: 03/01/2016 Document Reviewed: 03/01/2016 Elsevier Interactive Patient Education  2019 [employer].

## 2018-05-08 NOTE — Progress Notes (Signed)
05/08/2018 Holly Smith   Aug 06, 1945  [id number]  Primary Physician Juline Patch, MD Primary Cardiologist: Larae Grooms, MD  Electrophysiologist: None   Reason for Visit/CC: 6 month f/u for chronic diastolic HF and PAF  HPI:  Holly Smith is a 73 y.o. female who is being seen today for 6 month f/u. She is followed by Dr. Irish Lack. She has chronic atrial fibrillation that is now rate controlled and h/o tachycardia mediated cardiomyopathy in 2015. She was cardioverted at [facility]. EF later improved but she has chronic diastolic dysfunction. Last echo was in 2016. EF was 55-60%. Mild MR noted. She is on Xarelto for a/c.  She went back into atrial fibrillation several years ago which is now chronic.  Previously seen by Dr. Rayann Heman and not felt to be a good candidate for ablation.  She has failed propafenone  and amiodarone in the past.  She is on metoprolol for rate control. No h/o CAD. Other medical problems include HTN, HLD and obesity. She was last seen by Dr. Irish Lack in 10/2017 and was doing well. She was advised to f/u in 6 months.   Today in clinic, she reports that she has done well.  She denies any symptoms with her atrial fibrillation.  No palpitations, lightheadedness, dizziness, dyspnea nor chest pain.  She reports full medication compliance.  No abnormal bleeding with Xarelto.  She reports full compliance with her diuretics and checks weight daily.  Current Meds  Medication Sig  . acetaminophen (TYLENOL) 325 MG tablet Take 2 tablets (650 mg total) by mouth every 6 (six) hours as needed for mild pain (or Fever >/= 101).  . clobetasol ointment (TEMOVATE) 0.08 % Apply 1 application topically as needed (irritaion).  . furosemide (LASIX) 40 MG tablet Take 2 tablets (80 mg total) by mouth daily.  . irbesartan (AVAPRO) 75 MG tablet Take 1 tablet (75 mg total) by mouth daily.  . metoprolol succinate (TOPROL-XL) 50 MG 24 hr tablet Take 1 tablet (50 mg total) by mouth 2 (two)  times daily.  . metoprolol tartrate (LOPRESSOR) 100 MG tablet TAKE 1 TABLET BY MOUTH TWICE A DAY  . omeprazole (PRILOSEC) 20 MG capsule TAKE 1 CAPSULE BY MOUTH EVERY DAY  . potassium chloride SA (K-DUR,KLOR-CON) 20 MEQ tablet Take 1 tablet (20 mEq total) by mouth daily.  Marland Kitchen triamcinolone (NASACORT ALLERGY 24HR) 55 MCG/ACT AERO nasal inhaler Place 2 sprays into the nose daily as needed.   . vitamin B-12 (CYANOCOBALAMIN) 1000 MCG tablet Take 1,000 mcg by mouth every other day.   Alveda Reasons 20 MG TABS tablet TAKE 1 TABLET (20 MG TOTAL) BY MOUTH DAILY WITH SUPPER.  . [DISCONTINUED] albuterol (PROVENTIL HFA;VENTOLIN HFA) 108 (90 Base) MCG/ACT inhaler Inhale 2 puffs into the lungs every 8 (eight) hours as needed for wheezing or shortness of breath.  . [DISCONTINUED] clobetasol ointment (TEMOVATE) 6.76 % Apply 1 application topically at bedtime. To affected area (Patient taking differently: Apply 1 application topically as needed (irritation). To affected area)   Current Facility-Administered Medications for the 05/08/18 encounter (Office Visit) with Consuelo Pandy, PA-C  Medication  . albuterol (PROVENTIL) (2.5 MG/3ML) 0.083% nebulizer solution 2.5 mg   Allergies  Allergen Reactions  . Ciprofloxacin Other (See Comments)    SOB   Past Medical History:  Diagnosis Date  . A-fib ([place])   . Abnormal Pap smear of vagina and vaginal HPV   . Acute diastolic CHF (congestive heart failure) ([place]) 01/21/2015  . Acute diverticulitis 01/28/2015  .  Acute on chronic diastolic heart failure ([place]) 01/21/2015  . Anticoagulation adequate 08/02/2014   Overview:  Xarelto 20hs; CHADS=1+ htn, female, age  Overview:  Xarelto 20hs; CHADS=1+ htn, female, age  . Arthritis    "bones ache" (01/23/2015)  . Atrial fibrillation, persistent 08/13/2015   Overview:  symptomatic (fatigue, DOE, decreased exercise tolerance, palpitations), drug refractory (propafenone, amiodarone), complicated recovery from DCCV possibly related to  repiratory obstruction [facility] (patient fear of DCCV)  Overview:  symptomatic (fatigue, DOE, decreased exercise tolerance, palpitations), drug refractory (propafenone, amiodarone), complicated recovery from DCCV possibly relat  . Benign neoplasm of ascending colon   . Benign neoplasm of descending colon   . Benign neoplasm of sigmoid colon   . Blood in stool 01/21/2015  . Breath shortness 08/20/2013  . CAFL (chronic airflow limitation) ([place]) 12/16/2014  . Cancer ([place])    vulvar cancer  . CCF (congestive cardiac failure) ([place]) 08/03/2013   Overview:  ACUTE SYSTOLIC   . Chest pain 08/20/2013  . CHF (congestive heart failure) ([place])   . CHF (congestive heart failure) ([place])   . Chronic anticoagulation    She is on Xarelto for Afib  . Chronic atrial fibrillation   . Chronic combined systolic and diastolic CHF, NYHA class 3 ([place]) 01/28/2015  . Chronic diastolic heart failure ([place]) 05/05/2015  . Condyloma acuminatum of vulva 08/02/2014  . COPD (chronic obstructive pulmonary disease) ([place]) 08/13/2015  . Diverticulitis large intestine w/o perforation or abscess w/bleeding 01/28/2015  . Diverticulitis of large intestine without perforation or abscess with bleeding   . Dysmetabolic syndrome 9/76/7341   Overview:  BG-166 06/15/2013; elevated BMI; central obesity   . Dyspepsia   . Dyspnea   . Echocardiogram abnormal    Mitral regurgitation, tricuspic regurgitation  . Essential hypertension 10/07/2014   Trial off acei 10/07/2014  Due to atypical sob (talking = walking)> no real change 11/18/2014    . Family history of adverse reaction to anesthesia    sister gets very nauseated and vomits  . Gastritis without bleeding   . GERD (gastroesophageal reflux disease)   . History of kidney stones   . History of stress test    Myoview 8/16: EF 42%, anteroseptal, inferoseptal, anterior, apical anterior and apical defect (likely represents breast attenuation versus scar), no ischemia; intermediate risk because of low EF  .  HPV (human papilloma virus) anogenital infection    03/21/12 PAP + HR HPV  . HTN (hypertension) 12/16/2014  . Hypertension   . Hypertensive heart disease 05/12/2016  . Hypertensive urgency 01/21/2015  . Insulin resistance   . Kidney stone   . Kidney stones   . Malignant neoplasm of vulva ([place]) 06/19/2014   Overview:  Ms TOBIE PERDUE is a 73 yo who was referred by Dr Ouida Sills for a vulvar lesion. 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 The specimen had negative margins, but was within 2 mm of the cancer.    . Mitral regurgitation 04/23/2013   Overview:  "Moderate to severe" by TTE, "mild to mod" by TEE  Overview:  "Moderate to severe" by TTE, "mild to mod" by TEE  . Obesity   . OSA (obstructive sleep apnea) 11/27/2014   07/2013 RDI 17/h CPAP 8   . OSA on CPAP   . Other specified diseases of esophagus   . Postoperative nausea and vomiting 1983   many years ago with hysterectomy  . PVC (premature ventricular contraction) 12/16/2014  .  Sepsis ([place]) 12/13/2016  . TI (tricuspid incompetence) 04/23/2013   Overview:  "moderate" by TTE   . Urolithiasis   . Vertigo   . Vulvar cancer ([place]) 2016   invasive verrucous carcinoma of the vulva. History of genital warts and vulvar condyloma  . Vulvar cancer, carcinoma ([place]) 05/10/2014   Family History  Problem Relation Age of Onset  . Prostate cancer Brother 56       still living and well  . Heart attack Father   . Stroke Mother   . Diabetes Mother   . Hypertension Mother   . Heart attack Brother   . Pancreatic cancer Brother   . Heart disease Sister   . Breast cancer Neg Hx   . Kidney cancer Neg Hx   . Bladder Cancer Neg Hx    Past Surgical History:  Procedure Laterality Date  . ABDOMINAL HYSTERECTOMY    . APPENDECTOMY    . BILATERAL SALPINGOOPHORECTOMY  2003   benign ovarian cancer  . CARDIOVERSION  2014-2015   "[place]"  . COLONOSCOPY WITH  PROPOFOL N/A 06/10/2015   Procedure: COLONOSCOPY WITH PROPOFOL;  Surgeon: Lucilla Lame, MD;  Location: [facility] ENDOSCOPY;  Service: Endoscopy;  Laterality: N/A;  . CYSTOSCOPY W/ RETROGRADES Bilateral 10/19/2016   Procedure: CYSTOSCOPY WITH RETROGRADE PYELOGRAM;  Surgeon: Hollice Espy, MD;  Location: [facility] ORS;  Service: Urology;  Laterality: Bilateral;  . CYSTOSCOPY W/ URETERAL STENT PLACEMENT Left 12/29/2016   Procedure: CYSTOSCOPY WITH STENT REPLACEMENT;  Surgeon: Hollice Espy, MD;  Location: [facility] ORS;  Service: Urology;  Laterality: Left;  . CYSTOSCOPY WITH BIOPSY N/A 10/19/2016   Procedure: CYSTOSCOPY WITH BLADDER BIOPSY, VAGINAL WALL BIOPSY;  Surgeon: Hollice Espy, MD;  Location: [facility] ORS;  Service: Urology;  Laterality: N/A;  . CYSTOSCOPY WITH STENT PLACEMENT Left 12/14/2016   Procedure: CYSTOSCOPY WITH STENT PLACEMENT;  Surgeon: Hollice Espy, MD;  Location: [facility] ORS;  Service: Urology;  Laterality: Left;  . ESOPHAGOGASTRODUODENOSCOPY (EGD) WITH PROPOFOL N/A 10/05/2016   Surgeon: Lucilla Lame, MD;  Location: [facility] ENDOSCOPY;  Results: Barrett's Esophagus- repeat in 3 years 09/2019  . LITHOTRIPSY    . STONE EXTRACTION WITH BASKET Left 12/29/2016   Procedure: STONE EXTRACTION WITH BASKET;  Surgeon: Hollice Espy, MD;  Location: [facility] ORS;  Service: Urology;  Laterality: Left;  . TONSILLECTOMY    . URETEROSCOPY Left 12/29/2016   Procedure: URETEROSCOPY;  Surgeon: Hollice Espy, MD;  Location: [facility] ORS;  Service: Urology;  Laterality: Left;  Marland Kitchen VULVECTOMY Right 05/10/2014   Excisional biopsy of the superior right labial majora mass; [place] PARTIAL  07/02/2014   Re-excision and sentinel node dissection at [facility].    Social History   Socioeconomic History  . Marital status: Divorced    Spouse name: Not on file  . Number of children: 1  . Years of education: GED  . Highest education level: 12th grade  Occupational History  . Occupation: Retired  Scientific laboratory technician    . Financial resource strain: Not hard at all  . Food insecurity:    Worry: Never true    Inability: Never true  . Transportation needs:    Medical: No    Non-medical: No  Tobacco Use  . Smoking status: Former Smoker    Packs/day: 1.00    Years: 50.00    Pack years: 50.00    Types: Cigarettes    Last attempt to quit: 05/20/2012    Years since quitting: 5.9  . Smokeless tobacco: Never Used  . Tobacco  comment: smoking cessation materials not required  Substance and Sexual Activity  . Alcohol use: No    Alcohol/week: 0.0 standard drinks  . Drug use: No  . Sexual activity: Not Currently  Lifestyle  . Physical activity:    Days per week: 2 days    Minutes per session: 60 min  . Stress: Not at all  Relationships  . Social connections:    Talks on phone: More than three times a week    Gets together: Once a week    Attends religious service: 1 to 4 times per year    Active member of club or organization: No    Attends meetings of clubs or organizations: Never    Relationship status: Divorced  . Intimate partner violence:    Fear of current or ex partner: No    Emotionally abused: No    Physically abused: No    Forced sexual activity: No  Other Topics Concern  . Not on file  Social History Narrative  . Not on file     Review of Systems: General: negative for chills, fever, night sweats or weight changes.  Cardiovascular: negative for chest pain, dyspnea on exertion, edema, orthopnea, palpitations, paroxysmal nocturnal dyspnea or shortness of breath Dermatological: negative for rash Respiratory: negative for cough or wheezing Urologic: negative for hematuria Abdominal: negative for nausea, vomiting, diarrhea, bright red blood per rectum, melena, or hematemesis Neurologic: negative for visual changes, syncope, or dizziness All other systems reviewed and are otherwise negative except as noted above.   Physical Exam:  Height 5' (1.524 m), weight 225 lb 3.2 oz (102.2 kg),  SpO2 92 %.  General appearance: alert, cooperative, no distress and moderately obese Neck: no carotid bruit and no JVD Lungs: clear to auscultation bilaterally Heart: regular rate and rhythm, S1, S2 normal, no murmur, click, rub or gallop Extremities: extremities normal, atraumatic, no cyanosis or edema Pulses: 2+ and symmetric Skin: Skin color, texture, turgor normal. No rashes or lesions Neurologic: Grossly normal  EKG atrial fibrillation w/ CVR -- personally reviewed   ASSESSMENT AND PLAN:   1.  Chronic atrial fibrillation: Heart rates controlled and she is asymptomatic.  Continue metoprolol for rate control.  Continue Xarelto for anticoagulation.  2.  Chronic diastolic heart failure: Patient appears euvolemic on physical exam.  She denies dyspnea.  We discussed continuation of daily weights and low-sodium DM diet.  Continue Lasix and metoprolol.  3.  Hypertension: Blood pressure controlled on current regimen.  4.  Hyperlipidemia: Most recent lipid panel showed elevated LDL 113 mg/dL.  We discussed initiation of statin therapy which she declines at this time due to history of side effects.  We discussed low-fat diet and increasing physical activity/weight loss.   Follow-Up with Dr. Irish Lack in 6 months  Dung Prien Ladoris Gene, MHS [facility] 05/08/2018 10:17 AM

## 2018-05-31 ENCOUNTER — Inpatient Hospital Stay: Payer: Medicare HMO | Attending: Obstetrics and Gynecology | Admitting: Obstetrics and Gynecology

## 2018-05-31 ENCOUNTER — Other Ambulatory Visit: Payer: Self-pay

## 2018-05-31 VITALS — BP 158/72 | HR 93 | Temp 98.4°F | Resp 18 | Wt 226.5 lb

## 2018-05-31 DIAGNOSIS — Z90722 Acquired absence of ovaries, bilateral: Secondary | ICD-10-CM | POA: Insufficient documentation

## 2018-05-31 DIAGNOSIS — E785 Hyperlipidemia, unspecified: Secondary | ICD-10-CM | POA: Insufficient documentation

## 2018-05-31 DIAGNOSIS — Z79899 Other long term (current) drug therapy: Secondary | ICD-10-CM | POA: Insufficient documentation

## 2018-05-31 DIAGNOSIS — I110 Hypertensive heart disease with heart failure: Secondary | ICD-10-CM | POA: Diagnosis not present

## 2018-05-31 DIAGNOSIS — I4891 Unspecified atrial fibrillation: Secondary | ICD-10-CM | POA: Insufficient documentation

## 2018-05-31 DIAGNOSIS — J449 Chronic obstructive pulmonary disease, unspecified: Secondary | ICD-10-CM | POA: Diagnosis not present

## 2018-05-31 DIAGNOSIS — L439 Lichen planus, unspecified: Secondary | ICD-10-CM | POA: Insufficient documentation

## 2018-05-31 DIAGNOSIS — Z9079 Acquired absence of other genital organ(s): Secondary | ICD-10-CM | POA: Diagnosis not present

## 2018-05-31 DIAGNOSIS — Z90710 Acquired absence of both cervix and uterus: Secondary | ICD-10-CM | POA: Diagnosis not present

## 2018-05-31 DIAGNOSIS — R69 Illness, unspecified: Secondary | ICD-10-CM | POA: Diagnosis not present

## 2018-05-31 DIAGNOSIS — A630 Anogenital (venereal) warts: Secondary | ICD-10-CM | POA: Diagnosis not present

## 2018-05-31 DIAGNOSIS — I4819 Other persistent atrial fibrillation: Secondary | ICD-10-CM | POA: Diagnosis not present

## 2018-05-31 DIAGNOSIS — C519 Malignant neoplasm of vulva, unspecified: Secondary | ICD-10-CM | POA: Diagnosis not present

## 2018-05-31 DIAGNOSIS — Z87891 Personal history of nicotine dependence: Secondary | ICD-10-CM | POA: Insufficient documentation

## 2018-05-31 NOTE — Progress Notes (Signed)
Gynecologic Oncology Interval Note  Referring Provider: Dr. Laverta Baltimore  Chief Concern: Vulvar cancer surveillance.   Subjective:  Holly Smith is a 73 y.o. woman who presents today for continued surveillance for history of invasive vulvar cancer.   She was last seen in clinic on 11/30/2017.   Today, she reports overall feeling well. She uses clobetasol as needed for vaginal itching but not nightly or on regular schedule. Tolerating well without significant side effects.    Oncology Treatment History:  Patient had warty lesion on the upper right labia for about two years.    Hysterectomy in the past for benign disease.  Then BSO in 2003 for benign ovarian tumor.  03/21/12 PAP + HR HPV vaginal bx 2 o'clock negative, 9 o'clock HPV effect  Vulvar itching and pain.  07/18/12 - vulvar bx showed condyloma  4/91/79 - vulvar bx - lichen sclerosis  1/50/56 - Exophytic mass on right anterior vulva. 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  Negative margins, but within 71mm of cancer.  No LVSI.    We discussed the rationale for re-excision of the scar to insure that there is no residual cancer present in view of the close 2 mm margin and sentinel lymph node mapping and biopsy because the tumor has > 62mm invasion.  Had partial right modified radical vulvectomy on 07/02/14 at [facility] for invasive verrucous carcinoma of the vulva. The final pathology revealed: negative lymph nodes, no evidence of residual disease.  She was referred to Dr Erlene Quan in Urology because of abnormal appearing urethral meatus.  Had cysto and only squamous metaplasia found.  10/19/2016- Biopsy from vagina showed Lichen Sclerosis.  She had left ureteral stone removed with ureteroscope and got septic and was hospitalized.  No vulvar complaints today.  Still has urinary leakage for which she uses Vesicare.   PAP 3/16 normal.  No gyn complaints.    08/24/17:  Biopsy was performed by Dr. Theora Gianotti and showed Lichen Planus.    DIAGNOSIS:  A. VULVA, RIGHT 9:79; BIOPSY:  - LICHEN PLANUS-LIKE PATTERN OF INFLAMMATION.  - PAS STAIN IS NEGATIVE; STAIN CONTROL WORKED APPROPRIATELY.  - NEGATIVE FOR MALIGNANCY.   Comment:  Mild cytologic atypia is present and is interpreted as reactive. Re-examination after treatment is advised.   She started nightly clobetasol propionate 0.05% to affected area.       Problem List: Patient Active Problem List   Diagnosis Date Noted  . Vertigo 03/11/2017  . Sepsis ([place]) 12/13/2016  . Dyspepsia   . Gastritis without bleeding   . Other specified diseases of esophagus   . History of alcohol use 09/07/2016  . Hypertensive heart disease 05/12/2016  . Atrial fibrillation, persistent 08/13/2015  . COPD (chronic obstructive pulmonary disease) ([place]) 08/13/2015  . Personal history of other specified conditions 08/13/2015  . Insulin resistance 08/13/2015  . Obesity, unspecified 08/13/2015  . Special screening for malignant neoplasms, colon   . Benign neoplasm of ascending colon   . Benign neoplasm of descending colon   . Benign neoplasm of sigmoid colon   . Chronic diastolic heart failure ([place]) 05/05/2015  . Diverticulitis 01/31/2015  . Diverticulitis large intestine w/o perforation or abscess w/bleeding 01/28/2015  . Chronic combined systolic and diastolic CHF, NYHA class 3 ([place]) 01/28/2015  . Acute diverticulitis 01/28/2015  . Diverticulitis of large intestine without perforation or abscess with bleeding   . Acute on chronic diastolic heart failure ([place]) 01/21/2015  . Hypertensive  urgency 01/21/2015  . Morbid obesity ([place]) 01/21/2015  . Blood in stool 01/21/2015  . Acute diastolic CHF (congestive heart failure) ([place]) 01/21/2015  . CAFL (chronic airflow limitation) ([place]) 12/16/2014  . Personal history of perinatal problems 12/16/2014  . HTN (hypertension) 12/16/2014  . Hyperlipidemia, unspecified 12/16/2014  .  Dysmetabolic syndrome 16/12/9602  . Kidney stones 12/16/2014  . PVC (premature ventricular contraction) 12/16/2014  . Snoring 12/16/2014  . Stress incontinence 12/16/2014  . Fast heart beat 12/16/2014  . Calculus (=stone) 12/16/2014  . OSA (obstructive sleep apnea) 11/27/2014  . Dyspnea 10/07/2014  . Essential hypertension 10/07/2014  . Obesity 10/07/2014  . Genital warts 08/02/2014  . Condyloma acuminatum of vulva 08/02/2014  . Condylomata lata of vulva 08/02/2014  . Anticoagulation adequate 08/02/2014  . Chronic atrial fibrillation 08/02/2014  . Current tobacco use 08/02/2014  . 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 Malignant neoplasm of vulva ([place]) 06/19/2014  . Vulvar cancer ([place]) 05/10/2014  . Vulvar cancer, carcinoma ([place]) 05/10/2014  . 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 Chest pain 08/20/2013  . Breath shortness 08/20/2013  . CCF (congestive cardiac failure) ([place]) 08/03/2013  . CHF (congestive heart failure) ([place]) 08/03/2013  . H/O transesophageal echocardiography (TEE) for monitoring 06/21/2013  . Echocardiogram abnormal 04/23/2013  . Mitral regurgitation 04/23/2013  . TI (tricuspid incompetence) 04/23/2013    Past Medical History: Past Medical History:  Diagnosis Date  . A-fib ([place])   . Abnormal Pap smear of vagina and vaginal HPV   . Acute diastolic CHF (congestive heart failure) ([place]) 01/21/2015  . Acute diverticulitis 01/28/2015  . Acute on chronic diastolic heart failure ([place]) 01/21/2015  . Anticoagulation adequate 08/02/2014   Overview:  Xarelto 20hs; CHADS=1+ htn, female, age  Overview:  Xarelto 20hs; CHADS=1+ htn, female, age  . Arthritis    "bones ache" (01/23/2015)  . Atrial fibrillation, persistent 08/13/2015   Overview:  symptomatic (fatigue, DOE, decreased exercise tolerance, palpitations), drug refractory (propafenone, amiodarone), complicated recovery from DCCV possibly related to repiratory obstruction [facility] (patient fear of  DCCV)  Overview:  symptomatic (fatigue, DOE, decreased exercise tolerance, palpitations), drug refractory (propafenone, amiodarone), complicated recovery from DCCV possibly relat  . Benign neoplasm of ascending colon   . Benign neoplasm of descending colon   . Benign neoplasm of sigmoid colon   . Blood in stool 01/21/2015  . Breath shortness 08/20/2013  . CAFL (chronic airflow limitation) ([place]) 12/16/2014  . Cancer ([place])    vulvar cancer  . CCF (congestive cardiac failure) ([place]) 08/03/2013   Overview:  ACUTE SYSTOLIC   . Chest pain 08/20/2013  . CHF (congestive heart failure) ([place])   . CHF (congestive heart failure) ([place])   . Chronic anticoagulation    She is on Xarelto for Afib  . Chronic atrial fibrillation   . Chronic combined systolic and diastolic CHF, NYHA class 3 ([place]) 01/28/2015  . Chronic diastolic heart failure ([place]) 05/05/2015  . Condyloma acuminatum of vulva 08/02/2014  . COPD (chronic obstructive pulmonary disease) ([place]) 08/13/2015  . Diverticulitis large intestine w/o perforation or abscess w/bleeding 01/28/2015  . Diverticulitis of large intestine without perforation or abscess with bleeding   . Dysmetabolic syndrome 5/40/9811   Overview:  BG-166 06/15/2013; elevated BMI; central obesity   . Dyspepsia   . Dyspnea   . Echocardiogram abnormal    Mitral regurgitation, tricuspic regurgitation  . Essential hypertension 10/07/2014   Trial off acei 10/07/2014  Due to atypical sob (talking = walking)> no real change  11/18/2014    . Family history of adverse reaction to anesthesia    sister gets very nauseated and vomits  . Gastritis without bleeding   . GERD (gastroesophageal reflux disease)   . History of kidney stones   . History of stress test    Myoview 8/16: EF 42%, anteroseptal, inferoseptal, anterior, apical anterior and apical defect (likely represents breast attenuation versus scar), no ischemia; intermediate risk because of low EF  . HPV (human papilloma virus) anogenital  infection    03/21/12 PAP + HR HPV  . HTN (hypertension) 12/16/2014  . Hypertension   . Hypertensive heart disease 05/12/2016  . Hypertensive urgency 01/21/2015  . Insulin resistance   . Kidney stone   . Kidney stones   . Malignant neoplasm of vulva ([place]) 06/19/2014   Overview:  Ms JADY BRAGGS is a 73 yo who was referred by Dr Ouida Sills for a vulvar lesion. 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 The specimen had negative margins, but was within 2 mm of the cancer.    . Mitral regurgitation 04/23/2013   Overview:  "Moderate to severe" by TTE, "mild to mod" by TEE  Overview:  "Moderate to severe" by TTE, "mild to mod" by TEE  . Obesity   . OSA (obstructive sleep apnea) 11/27/2014   07/2013 RDI 17/h CPAP 8   . OSA on CPAP   . Other specified diseases of esophagus   . Postoperative nausea and vomiting 1983   many years ago with hysterectomy  . PVC (premature ventricular contraction) 12/16/2014  . Sepsis ([place]) 12/13/2016  . TI (tricuspid incompetence) 04/23/2013   Overview:  "moderate" by TTE   . Urolithiasis   . Vertigo   . Vulvar cancer ([place]) 2016   invasive verrucous carcinoma of the vulva. History of genital warts and vulvar condyloma  . Vulvar cancer, carcinoma ([place]) 05/10/2014    Past Surgical History: Past Surgical History:  Procedure Laterality Date  . ABDOMINAL HYSTERECTOMY    . APPENDECTOMY    . BILATERAL SALPINGOOPHORECTOMY  2003   benign ovarian cancer  . CARDIOVERSION  2014-2015   "[place]"  . COLONOSCOPY WITH PROPOFOL N/A 06/10/2015   Procedure: COLONOSCOPY WITH PROPOFOL;  Surgeon: Lucilla Lame, MD;  Location: [facility] ENDOSCOPY;  Service: Endoscopy;  Laterality: N/A;  . CYSTOSCOPY W/ RETROGRADES Bilateral 10/19/2016   Procedure: CYSTOSCOPY WITH RETROGRADE PYELOGRAM;  Surgeon: Hollice Espy, MD;  Location: [facility] ORS;  Service: Urology;  Laterality: Bilateral;  . CYSTOSCOPY W/ URETERAL  STENT PLACEMENT Left 12/29/2016   Procedure: CYSTOSCOPY WITH STENT REPLACEMENT;  Surgeon: Hollice Espy, MD;  Location: [facility] ORS;  Service: Urology;  Laterality: Left;  . CYSTOSCOPY WITH BIOPSY N/A 10/19/2016   Procedure: CYSTOSCOPY WITH BLADDER BIOPSY, VAGINAL WALL BIOPSY;  Surgeon: Hollice Espy, MD;  Location: [facility] ORS;  Service: Urology;  Laterality: N/A;  . CYSTOSCOPY WITH STENT PLACEMENT Left 12/14/2016   Procedure: CYSTOSCOPY WITH STENT PLACEMENT;  Surgeon: Hollice Espy, MD;  Location: [facility] ORS;  Service: Urology;  Laterality: Left;  . ESOPHAGOGASTRODUODENOSCOPY (EGD) WITH PROPOFOL N/A 10/05/2016   Surgeon: Lucilla Lame, MD;  Location: [facility] ENDOSCOPY;  Results: Barrett's Esophagus- repeat in 3 years 09/2019  . LITHOTRIPSY    . STONE EXTRACTION WITH BASKET Left 12/29/2016   Procedure: STONE EXTRACTION WITH BASKET;  Surgeon: Hollice Espy, MD;  Location: [facility] ORS;  Service: Urology;  Laterality: Left;  . TONSILLECTOMY    . URETEROSCOPY Left 12/29/2016  Procedure: URETEROSCOPY;  Surgeon: Hollice Espy, MD;  Location: [facility] ORS;  Service: Urology;  Laterality: Left;  Marland Kitchen VULVECTOMY Right 05/10/2014   Excisional biopsy of the superior right labial majora mass; [place] PARTIAL  07/02/2014   Re-excision and sentinel node dissection at [facility].     Family History: Family History  Problem Relation Age of Onset  . Prostate cancer Brother 39       still living and well  . Heart attack Father   . Stroke Mother   . Diabetes Mother   . Hypertension Mother   . Heart attack Brother   . Pancreatic cancer Brother   . Heart disease Sister   . Breast cancer Neg Hx   . Kidney cancer Neg Hx   . Bladder Cancer Neg Hx     Social History: Social History   Socioeconomic History  . Marital status: Divorced    Spouse name: Not on file  . Number of children: 1  . Years of education: GED  . Highest education level: 12th grade  Occupational History  . Occupation:  Retired  Scientific laboratory technician  . Financial resource strain: Not hard at all  . Food insecurity:    Worry: Never true    Inability: Never true  . Transportation needs:    Medical: No    Non-medical: No  Tobacco Use  . Smoking status: Former Smoker    Packs/day: 1.00    Years: 50.00    Pack years: 50.00    Types: Cigarettes    Last attempt to quit: 05/20/2012    Years since quitting: 6.0  . Smokeless tobacco: Never Used  . Tobacco comment: smoking cessation materials not required  Substance and Sexual Activity  . Alcohol use: No    Alcohol/week: 0.0 standard drinks  . Drug use: No  . Sexual activity: Not Currently  Lifestyle  . Physical activity:    Days per week: 2 days    Minutes per session: 60 min  . Stress: Not at all  Relationships  . Social connections:    Talks on phone: More than three times a week    Gets together: Once a week    Attends religious service: 1 to 4 times per year    Active member of club or organization: No    Attends meetings of clubs or organizations: Never    Relationship status: Divorced  . Intimate partner violence:    Fear of current or ex partner: No    Emotionally abused: No    Physically abused: No    Forced sexual activity: No  Other Topics Concern  . Not on file  Social History Narrative  . Not on file    Allergies: Allergies  Allergen Reactions  . Ciprofloxacin Other (See Comments)    SOB    Current Medications: Current Outpatient Medications  Medication Sig Dispense Refill  . acetaminophen (TYLENOL) 325 MG tablet Take 2 tablets (650 mg total) by mouth every 6 (six) hours as needed for mild pain (or Fever >/= 101).    . clobetasol ointment (TEMOVATE) 0.96 % Apply 1 application topically as needed (irritaion).    . furosemide (LASIX) 40 MG tablet Take 2 tablets (80 mg total) by mouth daily. 180 tablet 2  . irbesartan (AVAPRO) 75 MG tablet Take 1/2 tablet by mouth daily    . metoprolol succinate (TOPROL-XL) 50 MG 24 hr tablet Take 1  tablet (50 mg total) by mouth 2 (two) times daily. [place]  tablet 3  . metoprolol tartrate (LOPRESSOR) 100 MG tablet TAKE 1 TABLET BY MOUTH TWICE A DAY 180 tablet 3  . omeprazole (PRILOSEC) 20 MG capsule TAKE 1 CAPSULE BY MOUTH EVERY DAY 30 capsule 11  . potassium chloride SA (K-DUR,KLOR-CON) 20 MEQ tablet Take 20 mEq by mouth as needed (Build up blood).    . triamcinolone (NASACORT ALLERGY 24HR) 55 MCG/ACT AERO nasal inhaler Place 2 sprays into the nose daily as needed.     . vitamin B-12 (CYANOCOBALAMIN) 1000 MCG tablet Take 1,000 mcg by mouth every other day.     Alveda Reasons 20 MG TABS tablet TAKE 1 TABLET (20 MG TOTAL) BY MOUTH DAILY WITH SUPPER. 90 tablet 1   Current Facility-Administered Medications  Medication Dose Route Frequency Provider Last Rate Last Dose  . albuterol (PROVENTIL) (2.5 MG/3ML) 0.083% nebulizer solution 2.5 mg  2.5 mg Nebulization Once Juline Patch, MD       Review of Systems General:  no complaints Skin: no complaints Eyes: no complaints HEENT: no complaints Breasts: no complaints Pulmonary: no complaints Cardiac: no complaints Gastrointestinal: no complaints Genitourinary/Sexual: no complaints Ob/Gyn: no complaints Musculoskeletal: no complaints Hematology: no complaints Neurologic/Psych: no complaints   Objective:  Physical Examination:  BP (!) 158/72 (BP Location: Left Arm, Patient Position: Sitting)   Pulse 93   Temp 98.4 F (36.9 C) (Tympanic)   Resp 18   Wt 226 lb 8 oz (102.7 kg)   BMI 44.24 kg/m   ECOG Performance Status: 0 - Asymptomatic  GENERAL: Patient is a well appearing female in no acute distress NODES:  Negative axillary, supraclavicular, inguinal lymph node survery LUNGS:  Clear to auscultation bilaterally.   HEART:  Irregularly irregular rhythm ABDOMEN:  Soft, nontender. No masses or ascites. Obese EXTREMITIES:  No peripheral edema. Atraumatic. No cyanosis SKIN:  Clear with no obvious rashes or skin changes.  NEURO:  Nonfocal.  Well oriented.  Appropriate affect.  Pelvic: exam chaperoned by nurse;  Vulva: s/p partial right vulvectomy; no lesions or abnormalities seen. Atrophic and diffuse mild erythema present. Vagina: normal vagina; Cervix, Adnexa, Uterus: surgically absent; Rectal: normal rectal, no masses.  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  NED.   Lichen sclerosis and lichen planus controlled on clobetasol propionate 0.05% ointment.   Plan:   Problem List Items Addressed This Visit      Musculoskeletal and Integument   Lichen planus     Genitourinary   Vulvar cancer, carcinoma ([place]) - Primary      She can follow up with Dr Erlene Quan in Urology as needed.   Use topical corticosteroid regimen for vulvar lichen planus as needed   Return to clinic in 6 months for continued surveillance.     Verlon Au, NP  I personally had a face to face interaction and evaluated the patient jointly with the NP, Ms. Beckey Rutter.  I have reviewed her history and available records and have performed the key portions of the physical exam including inguinal lymph node survey, abdominal exam, pelvic exam with my findings confirming those documented above by the APP.  I have discussed the case with the APP and the patient.  I agree with the above documentation, assessment and plan which was fully formulated by me.  Counseling was completed by me.   I personally saw the patient and performed a substantive  portion of this encounter in conjunction with the listed APP as documented above.  A total of 25 minutes were spent with the patient/family today; >50% was spent in education, counseling and coordination of care for vulvar cancer and Lichen Planus.  Abenezer Odonell Gaetana Michaelis, MD

## 2018-05-31 NOTE — Progress Notes (Signed)
Here for follow up. Per pt " feeling good " no vag discharge ,no voiced c/o.

## 2018-07-11 ENCOUNTER — Other Ambulatory Visit: Payer: Self-pay | Admitting: Interventional Cardiology

## 2018-07-11 NOTE — Telephone Encounter (Signed)
72yo, 226lbs, Scr 0.95 on 2/30/20, Crcl 61ml/min Last OV 05/08/18 Indication afib

## 2018-07-19 ENCOUNTER — Other Ambulatory Visit: Payer: Self-pay | Admitting: Interventional Cardiology

## 2018-10-19 ENCOUNTER — Other Ambulatory Visit: Payer: Self-pay | Admitting: Interventional Cardiology

## 2018-11-29 ENCOUNTER — Ambulatory Visit: Payer: Medicare HMO

## 2018-12-19 NOTE — Progress Notes (Signed)
Patient is coming in for follow up, she is doing well no major complaints.  

## 2018-12-20 ENCOUNTER — Other Ambulatory Visit: Payer: Self-pay

## 2018-12-20 ENCOUNTER — Inpatient Hospital Stay: Payer: Medicare HMO | Attending: Obstetrics and Gynecology | Admitting: Obstetrics and Gynecology

## 2018-12-20 ENCOUNTER — Encounter (INDEPENDENT_AMBULATORY_CARE_PROVIDER_SITE_OTHER): Payer: Self-pay

## 2018-12-20 VITALS — BP 127/61 | HR 92 | Temp 97.9°F | Wt 229.4 lb

## 2018-12-20 DIAGNOSIS — I493 Ventricular premature depolarization: Secondary | ICD-10-CM | POA: Diagnosis not present

## 2018-12-20 DIAGNOSIS — I482 Chronic atrial fibrillation, unspecified: Secondary | ICD-10-CM | POA: Diagnosis not present

## 2018-12-20 DIAGNOSIS — I110 Hypertensive heart disease with heart failure: Secondary | ICD-10-CM | POA: Diagnosis not present

## 2018-12-20 DIAGNOSIS — Z87891 Personal history of nicotine dependence: Secondary | ICD-10-CM | POA: Diagnosis not present

## 2018-12-20 DIAGNOSIS — K219 Gastro-esophageal reflux disease without esophagitis: Secondary | ICD-10-CM | POA: Insufficient documentation

## 2018-12-20 DIAGNOSIS — L439 Lichen planus, unspecified: Secondary | ICD-10-CM | POA: Diagnosis not present

## 2018-12-20 DIAGNOSIS — G4733 Obstructive sleep apnea (adult) (pediatric): Secondary | ICD-10-CM | POA: Insufficient documentation

## 2018-12-20 DIAGNOSIS — E8881 Metabolic syndrome: Secondary | ICD-10-CM | POA: Insufficient documentation

## 2018-12-20 DIAGNOSIS — J449 Chronic obstructive pulmonary disease, unspecified: Secondary | ICD-10-CM | POA: Diagnosis not present

## 2018-12-20 DIAGNOSIS — E785 Hyperlipidemia, unspecified: Secondary | ICD-10-CM | POA: Diagnosis not present

## 2018-12-20 DIAGNOSIS — I340 Nonrheumatic mitral (valve) insufficiency: Secondary | ICD-10-CM | POA: Diagnosis not present

## 2018-12-20 DIAGNOSIS — E669 Obesity, unspecified: Secondary | ICD-10-CM | POA: Diagnosis not present

## 2018-12-20 DIAGNOSIS — Z90722 Acquired absence of ovaries, bilateral: Secondary | ICD-10-CM | POA: Diagnosis not present

## 2018-12-20 DIAGNOSIS — A630 Anogenital (venereal) warts: Secondary | ICD-10-CM | POA: Diagnosis not present

## 2018-12-20 DIAGNOSIS — I361 Nonrheumatic tricuspid (valve) insufficiency: Secondary | ICD-10-CM | POA: Diagnosis not present

## 2018-12-20 DIAGNOSIS — R69 Illness, unspecified: Secondary | ICD-10-CM | POA: Diagnosis not present

## 2018-12-20 DIAGNOSIS — Z79899 Other long term (current) drug therapy: Secondary | ICD-10-CM | POA: Insufficient documentation

## 2018-12-20 DIAGNOSIS — Z7901 Long term (current) use of anticoagulants: Secondary | ICD-10-CM | POA: Insufficient documentation

## 2018-12-20 DIAGNOSIS — Z90710 Acquired absence of both cervix and uterus: Secondary | ICD-10-CM | POA: Diagnosis not present

## 2018-12-20 DIAGNOSIS — R42 Dizziness and giddiness: Secondary | ICD-10-CM | POA: Insufficient documentation

## 2018-12-20 DIAGNOSIS — C519 Malignant neoplasm of vulva, unspecified: Secondary | ICD-10-CM | POA: Diagnosis not present

## 2018-12-20 NOTE — Progress Notes (Signed)
Gynecologic Oncology Interval Note  Referring Provider: Dr. Laverta Baltimore  Chief Concern: Vulvar cancer surveillance.   Subjective:  Holly Smith is a 73 y.o. woman who presents today for continued surveillance for history of invasive vulvar cancer.   She was last seen in clinic on 11/30/2017.   Today, she reports overall feeling well. She uses clobetasol as needed for vaginal itching but not nightly or on regular schedule. Tolerating well without significant side effects.    Oncology Treatment History:  Holly Smith is a pleasant woman with history of invasive vulvar cancer.   Patient had warty lesion on the upper right labia for about two years.    Hysterectomy in the past for benign disease.  Then BSO in 2003 for benign ovarian tumor.  03/21/12 PAP + HR HPV vaginal bx 2 o'clock negative, 9 o'clock HPV effect  Vulvar itching and pain.  07/18/12 - vulvar bx showed condyloma  [id number] - vulvar bx - lichen sclerosis  [id number] - Exophytic mass on right anterior vulva. 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  Negative margins, but within 97mm of cancer.  No LVSI.    We discussed the rationale for re-excision of the scar to insure that there is no residual cancer present in view of the close 2 mm margin and sentinel lymph node mapping and biopsy because the tumor has > 51mm invasion.  Had partial right modified radical vulvectomy on 07/02/14 at [facility] for invasive verrucous carcinoma of the vulva. The final pathology revealed: negative lymph nodes, no evidence of residual disease.  She was referred to Dr Erlene Quan in Urology because of abnormal appearing urethral meatus.  Had cysto and only squamous metaplasia found.  10/19/2016- Biopsy from vagina showed Lichen Sclerosis.  She had left ureteral stone removed with ureteroscope and got septic and was hospitalized.  No vulvar complaints today.  Still has urinary leakage for  which she uses Vesicare.   PAP 3/16 normal.  No gyn complaints.    08/24/17: Biopsy was performed by Dr. Theora Gianotti and showed Lichen Planus.    DIAGNOSIS:  A. VULVA, RIGHT [id number]; BIOPSY:  - LICHEN PLANUS-LIKE PATTERN OF INFLAMMATION.  - PAS STAIN IS NEGATIVE; STAIN CONTROL WORKED APPROPRIATELY.  - NEGATIVE FOR MALIGNANCY.   Comment:  Mild cytologic atypia is present and is interpreted as reactive. Re-examination after treatment is advised.   She started nightly clobetasol propionate 0.05% to affected area.       Problem List: Patient Active Problem List   Diagnosis Date Noted  . Lichen planus [id number]  . Vertigo 03/11/2017  . Sepsis ([place]) 12/13/2016  . Dyspepsia   . Gastritis without bleeding   . Other specified diseases of esophagus   . History of alcohol use 09/07/2016  . Hypertensive heart disease 05/12/2016  . Atrial fibrillation, persistent 08/13/2015  . COPD (chronic obstructive pulmonary disease) ([place]) 08/13/2015  . Personal history of other specified conditions 08/13/2015  . Insulin resistance 08/13/2015  . Obesity, unspecified 08/13/2015  . Special screening for malignant neoplasms, colon   . Benign neoplasm of ascending colon   . Benign neoplasm of descending colon   . Benign neoplasm of sigmoid colon   . Chronic diastolic heart failure ([place]) 05/05/2015  . Diverticulitis 01/31/2015  . Diverticulitis large intestine w/o perforation or abscess w/bleeding 01/28/2015  . Chronic combined systolic and diastolic CHF, NYHA class 3 ([place]) 01/28/2015  . Acute diverticulitis 01/28/2015  . Diverticulitis of large intestine  without perforation or abscess with bleeding   . Acute on chronic diastolic heart failure ([place]) 01/21/2015  . Hypertensive urgency 01/21/2015  . Morbid obesity ([place]) 01/21/2015  . Blood in stool 01/21/2015  . Acute diastolic CHF (congestive heart failure) ([place]) 01/21/2015  . CAFL (chronic airflow limitation) ([place]) 12/16/2014  . Personal history of  perinatal problems 12/16/2014  . HTN (hypertension) 12/16/2014  . Hyperlipidemia, unspecified 12/16/2014  . Dysmetabolic syndrome 123456  . Kidney stones 12/16/2014  . PVC (premature ventricular contraction) 12/16/2014  . Snoring 12/16/2014  . Stress incontinence 12/16/2014  . Fast heart beat 12/16/2014  . Calculus (=stone) 12/16/2014  . OSA (obstructive sleep apnea) 11/27/2014  . Dyspnea 10/07/2014  . Essential hypertension 10/07/2014  . Obesity 10/07/2014  . Genital warts 08/02/2014  . Condyloma acuminatum of vulva 08/02/2014  . Condylomata lata of vulva 08/02/2014  . Anticoagulation adequate 08/02/2014  . Chronic atrial fibrillation 08/02/2014  . Current tobacco use 08/02/2014  . 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 Malignant neoplasm of vulva ([place]) 06/19/2014  . Vulvar cancer ([place]) 05/10/2014  . Vulvar cancer, carcinoma ([place]) 05/10/2014  . 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 Chest pain 08/20/2013  . Breath shortness 08/20/2013  . CCF (congestive cardiac failure) ([place]) 08/03/2013  . CHF (congestive heart failure) ([place]) 08/03/2013  . H/O transesophageal echocardiography (TEE) for monitoring 06/21/2013  . Echocardiogram abnormal 04/23/2013  . Mitral regurgitation 04/23/2013  . TI (tricuspid incompetence) 04/23/2013    Past Medical History: Past Medical History:  Diagnosis Date  . A-fib ([place])   . Abnormal Pap smear of vagina and vaginal HPV   . Acute diastolic CHF (congestive heart failure) ([place]) 01/21/2015  . Acute diverticulitis 01/28/2015  . Acute on chronic diastolic heart failure ([place]) 01/21/2015  . Anticoagulation adequate 08/02/2014   Overview:  Xarelto 20hs; CHADS=1+ htn, female, age  Overview:  Xarelto 20hs; CHADS=1+ htn, female, age  . Arthritis    "bones ache" (01/23/2015)  . Atrial fibrillation, persistent 08/13/2015   Overview:  symptomatic (fatigue, DOE, decreased exercise tolerance, palpitations), drug refractory (propafenone,  amiodarone), complicated recovery from DCCV possibly related to repiratory obstruction [facility] (patient fear of DCCV)  Overview:  symptomatic (fatigue, DOE, decreased exercise tolerance, palpitations), drug refractory (propafenone, amiodarone), complicated recovery from DCCV possibly relat  . Benign neoplasm of ascending colon   . Benign neoplasm of descending colon   . Benign neoplasm of sigmoid colon   . Blood in stool 01/21/2015  . Breath shortness 08/20/2013  . CAFL (chronic airflow limitation) ([place]) 12/16/2014  . Cancer ([place])    vulvar cancer  . CCF (congestive cardiac failure) ([place]) 08/03/2013   Overview:  ACUTE SYSTOLIC   . Chest pain 08/20/2013  . CHF (congestive heart failure) ([place])   . CHF (congestive heart failure) ([place])   . Chronic anticoagulation    She is on Xarelto for Afib  . Chronic atrial fibrillation   . Chronic combined systolic and diastolic CHF, NYHA class 3 ([place]) 01/28/2015  . Chronic diastolic heart failure ([place]) 05/05/2015  . Condyloma acuminatum of vulva 08/02/2014  . COPD (chronic obstructive pulmonary disease) ([place]) 08/13/2015  . Diverticulitis large intestine w/o perforation or abscess w/bleeding 01/28/2015  . Diverticulitis of large intestine without perforation or abscess with bleeding   . Dysmetabolic syndrome [id number]   Overview:  BG-166 06/15/2013; elevated BMI; central obesity   . Dyspepsia   . Dyspnea   . Echocardiogram abnormal    Mitral regurgitation, tricuspic regurgitation  .  Essential hypertension 10/07/2014   Trial off acei 10/07/2014  Due to atypical sob (talking = walking)> no real change 11/18/2014    . Family history of adverse reaction to anesthesia    sister gets very nauseated and vomits  . Gastritis without bleeding   . GERD (gastroesophageal reflux disease)   . History of kidney stones   . History of stress test    Myoview 8/16: EF 42%, anteroseptal, inferoseptal, anterior, apical anterior and apical defect (likely represents breast attenuation  versus scar), no ischemia; intermediate risk because of low EF  . HPV (human papilloma virus) anogenital infection    03/21/12 PAP + HR HPV  . HTN (hypertension) 12/16/2014  . Hypertension   . Hypertensive heart disease 05/12/2016  . Hypertensive urgency 01/21/2015  . Insulin resistance   . Kidney stone   . Kidney stones   . Malignant neoplasm of vulva ([place]) 06/19/2014   Overview:  Holly Smith is a 73 yo who was referred by Dr Ouida Sills for a vulvar lesion. 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 The specimen had negative margins, but was within 2 mm of the cancer.    . Mitral regurgitation 04/23/2013   Overview:  "Moderate to severe" by TTE, "mild to mod" by TEE  Overview:  "Moderate to severe" by TTE, "mild to mod" by TEE  . Obesity   . OSA (obstructive sleep apnea) 11/27/2014   07/2013 RDI 17/h CPAP 8   . OSA on CPAP   . Other specified diseases of esophagus   . Postoperative nausea and vomiting 1983   many years ago with hysterectomy  . PVC (premature ventricular contraction) 12/16/2014  . Sepsis ([place]) 12/13/2016  . TI (tricuspid incompetence) 04/23/2013   Overview:  "moderate" by TTE   . Urolithiasis   . Vertigo   . Vulvar cancer ([place]) 2016   invasive verrucous carcinoma of the vulva. History of genital warts and vulvar condyloma  . Vulvar cancer, carcinoma ([place]) 05/10/2014    Past Surgical History: Past Surgical History:  Procedure Laterality Date  . ABDOMINAL HYSTERECTOMY    . APPENDECTOMY    . BILATERAL SALPINGOOPHORECTOMY  2003   benign ovarian cancer  . CARDIOVERSION  2014-2015   "[place]"  . COLONOSCOPY WITH PROPOFOL N/A 06/10/2015   Procedure: COLONOSCOPY WITH PROPOFOL;  Surgeon: Lucilla Lame, MD;  Location: [facility] ENDOSCOPY;  Service: Endoscopy;  Laterality: N/A;  . CYSTOSCOPY W/ RETROGRADES Bilateral 10/19/2016   Procedure: CYSTOSCOPY WITH RETROGRADE PYELOGRAM;  Surgeon:  Hollice Espy, MD;  Location: [facility] ORS;  Service: Urology;  Laterality: Bilateral;  . CYSTOSCOPY W/ URETERAL STENT PLACEMENT Left 12/29/2016   Procedure: CYSTOSCOPY WITH STENT REPLACEMENT;  Surgeon: Hollice Espy, MD;  Location: [facility] ORS;  Service: Urology;  Laterality: Left;  . CYSTOSCOPY WITH BIOPSY N/A 10/19/2016   Procedure: CYSTOSCOPY WITH BLADDER BIOPSY, VAGINAL WALL BIOPSY;  Surgeon: Hollice Espy, MD;  Location: [facility] ORS;  Service: Urology;  Laterality: N/A;  . CYSTOSCOPY WITH STENT PLACEMENT Left 12/14/2016   Procedure: CYSTOSCOPY WITH STENT PLACEMENT;  Surgeon: Hollice Espy, MD;  Location: [facility] ORS;  Service: Urology;  Laterality: Left;  . ESOPHAGOGASTRODUODENOSCOPY (EGD) WITH PROPOFOL N/A 10/05/2016   Surgeon: Lucilla Lame, MD;  Location: [facility] ENDOSCOPY;  Results: Barrett's Esophagus- repeat in 3 years 09/2019  . LITHOTRIPSY    . STONE EXTRACTION WITH BASKET Left 12/29/2016   Procedure: STONE EXTRACTION WITH BASKET;  Surgeon: Hollice Espy, MD;  Location: [facility] ORS;  Service: Urology;  Laterality: Left;  . TONSILLECTOMY    . URETEROSCOPY Left 12/29/2016   Procedure: URETEROSCOPY;  Surgeon: Hollice Espy, MD;  Location: [facility] ORS;  Service: Urology;  Laterality: Left;  Marland Kitchen VULVECTOMY Right 05/10/2014   Excisional biopsy of the superior right labial majora mass; [place] PARTIAL  07/02/2014   Re-excision and sentinel node dissection at [facility].     Family History: Family History  Problem Relation Age of Onset  . Prostate cancer Brother 1       still living and well  . Heart attack Father   . Stroke Mother   . Diabetes Mother   . Hypertension Mother   . Heart attack Brother   . Pancreatic cancer Brother   . Heart disease Sister   . Breast cancer Neg Hx   . Kidney cancer Neg Hx   . Bladder Cancer Neg Hx     Social History: Social History   Socioeconomic History  . Marital status: Divorced    Spouse name: Not on file  . Number of children: 1   . Years of education: GED  . Highest education level: 12th grade  Occupational History  . Occupation: Retired  Scientific laboratory technician  . Financial resource strain: Not hard at all  . Food insecurity    Worry: Never true    Inability: Never true  . Transportation needs    Medical: No    Non-medical: No  Tobacco Use  . Smoking status: Former Smoker    Packs/day: 1.00    Years: 50.00    Pack years: 50.00    Types: Cigarettes    Quit date: 05/20/2012    Years since quitting: 6.5  . Smokeless tobacco: Never Used  . Tobacco comment: smoking cessation materials not required  Substance and Sexual Activity  . Alcohol use: No    Alcohol/week: 0.0 standard drinks  . Drug use: No  . Sexual activity: Not Currently  Lifestyle  . Physical activity    Days per week: 2 days    Minutes per session: 60 min  . Stress: Not at all  Relationships  . Social connections    Talks on phone: More than three times a week    Gets together: Once a week    Attends religious service: 1 to 4 times per year    Active member of club or organization: No    Attends meetings of clubs or organizations: Never    Relationship status: Divorced  . Intimate partner violence    Fear of current or ex partner: No    Emotionally abused: No    Physically abused: No    Forced sexual activity: No  Other Topics Concern  . Not on file  Social History Narrative  . Not on file    Allergies: Allergies  Allergen Reactions  . Ciprofloxacin Other (See Comments)    SOB    Current Medications: Current Outpatient Medications  Medication Sig Dispense Refill  . acetaminophen (TYLENOL) 325 MG tablet Take 2 tablets (650 mg total) by mouth every 6 (six) hours as needed for mild pain (or Fever >/= 101).    Marland Kitchen BIOTIN PO Take by mouth.    . clobetasol ointment (TEMOVATE) [id number] % Apply 1 application topically as needed (irritaion).    . furosemide (LASIX) 40 MG tablet Take 2 tablets (80 mg total) by mouth daily. 180 tablet 2  .  irbesartan (AVAPRO) 75 MG tablet TAKE 1 TABLET  BY MOUTH EVERY DAY 90 tablet 2  . metoprolol succinate (TOPROL-XL) 50 MG 24 hr tablet TAKE 1 TABLET BY MOUTH TWICE A DAY 180 tablet 1  . metoprolol tartrate (LOPRESSOR) 100 MG tablet TAKE 1 TABLET BY MOUTH TWICE A DAY 180 tablet 1  . omeprazole (PRILOSEC) 20 MG capsule TAKE 1 CAPSULE BY MOUTH EVERY DAY 30 capsule 11  . potassium chloride SA (K-DUR,KLOR-CON) 20 MEQ tablet Take 20 mEq by mouth as needed (Build up blood).    . triamcinolone (NASACORT ALLERGY 24HR) 55 MCG/ACT AERO nasal inhaler Place 2 sprays into the nose daily as needed.     . vitamin B-12 (CYANOCOBALAMIN) 1000 MCG tablet Take 1,000 mcg by mouth every other day.     Alveda Reasons 20 MG TABS tablet TAKE 1 TABLET (20 MG TOTAL) BY MOUTH DAILY WITH SUPPER. 90 tablet 1   Current Facility-Administered Medications  Medication Dose Route Frequency Provider Last Rate Last Dose  . albuterol (PROVENTIL) (2.5 MG/3ML) 0.083% nebulizer solution 2.5 mg  2.5 mg Nebulization Once Juline Patch, MD       Review of Systems General:  no complaints Skin: no complaints Eyes: no complaints HEENT: no complaints Breasts: no complaints Pulmonary: no complaints Cardiac: no complaints Gastrointestinal: no complaints Genitourinary/Sexual: no complaints Ob/Gyn: no complaints Musculoskeletal: no complaints Hematology: no complaints Neurologic/Psych: no complaints   Objective:  Physical Examination:  BP 127/61 (BP Location: Left Arm, Patient Position: Sitting)   Pulse 92   Temp 97.9 F (36.6 C) (Tympanic)   Wt 229 lb 7 oz (104.1 kg)   BMI 44.81 kg/m   ECOG Performance Status: 0 - Asymptomatic  GENERAL: Patient is a well appearing female in no acute distress NODES:  Negative axillary, supraclavicular, inguinal lymph node survery LUNGS:  Clear to auscultation bilaterally.   HEART:  Irregularly irregular rhythm ABDOMEN:  Soft, nontender. No masses or ascites. Obese EXTREMITIES:  No peripheral  edema. Atraumatic. No cyanosis SKIN:  Clear with no obvious rashes or skin changes.  NEURO:  Nonfocal. Well oriented.  Appropriate affect.  Pelvic: exam chaperoned by nurse;  Vulva: s/p partial right vulvectomy; no lesions or abnormalities seen. Atrophic and diffuse mild erythema present. Vagina: normal vagina; Cervix, Adnexa, Uterus: surgically absent; Rectal: normal rectal, no masses.  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  NED.   Lichen sclerosis and lichen planus controlled on clobetasol propionate 0.05% ointment.   Plan:   Problem List Items Addressed This Visit      Musculoskeletal and Integument   Lichen planus     Genitourinary   Vulvar cancer ([place]) - Primary      She can follow up with Dr Erlene Quan in Urology as needed.   Use topical corticosteroid regimen for vulvar lichen planus as needed   Return to clinic in 6 months for continued surveillance.    A total of 25 minutes were spent with the patient/family today; >50% was spent in education, counseling and coordination of care for vulvar cancer and Lichen Planus.    Gaetana Michaelis, MD

## 2018-12-20 NOTE — Progress Notes (Signed)
Patient here today for follow up.  Patient has no complaints today

## 2018-12-23 ENCOUNTER — Other Ambulatory Visit: Payer: Self-pay | Admitting: Interventional Cardiology

## 2019-01-07 NOTE — H&P (View-Only) (Signed)
Cardiology Office Note   Date:  01/08/2019   ID:  Holly, Smith 05-22-1945, MRN [id number]  PCP:  Juline Patch, MD    No chief complaint on file.  AFib  Wt Readings from Last 3 Encounters:  01/08/19 232 lb 12.8 oz (105.6 kg)  12/20/18 229 lb 7 oz (104.1 kg)  05/31/18 226 lb 8 oz (102.7 kg)       History of Present Illness: Holly Smith is a 73 y.o. female  Who has chronic atrial fibrillation that is now rate controlled and h/o tachycardia mediated cardiomyopathy in 2015. She was cardioverted at [facility]. EF later improved but she has chronic diastolic dysfunction. Last echo was in 2016. EF was 55-60%. Mild MR noted. She is on Xarelto for a/c.  She went back into atrial fibrillation several years ago which is now chronic.  Previously seen by Dr. Rayann Heman and not felt to be a good candidate for ablation.  She has failed propafenone  and amiodarone in the past.  She is on metoprolol for rate control. No h/o CAD. Other medical problems include HTN, HLD and obesity.  In 2019: "We discussed weight loss surgery referral.  She had a friend who died many years ago during weight loss surgery so she is hesitant.  She prefers the Atkins diet to try to lose weight.  She has done well with this in the past. "  Since the last visit, she had some palpitations.  This is rare.  She was sitting a few days ago when this happened.  SHe had some chest tightness with this episode.  It lasted about 30 minutes.  No recurrence of chest pain.  No CP while walking from her car to the office.  She hd to walk up a slight incline and had only some mild SHOB.   Denies :  Dizziness. Leg edema. Nitroglycerin use. Orthopnea.  Paroxysmal nocturnal dyspnea. Syncope.     Past Medical History:  Diagnosis Date  . A-fib ([place])   . Abnormal Pap smear of vagina and vaginal HPV   . Acute diastolic CHF (congestive heart failure) ([place]) 01/21/2015  . Acute diverticulitis 01/28/2015  . Acute on chronic  diastolic heart failure ([place]) 01/21/2015  . Anticoagulation adequate 08/02/2014   Overview:  Xarelto 20hs; CHADS=1+ htn, female, age  Overview:  Xarelto 20hs; CHADS=1+ htn, female, age  . Arthritis    "bones ache" (01/23/2015)  . Atrial fibrillation, persistent ([place]) 08/13/2015   Overview:  symptomatic (fatigue, DOE, decreased exercise tolerance, palpitations), drug refractory (propafenone, amiodarone), complicated recovery from DCCV possibly related to repiratory obstruction [facility] (patient fear of DCCV)  Overview:  symptomatic (fatigue, DOE, decreased exercise tolerance, palpitations), drug refractory (propafenone, amiodarone), complicated recovery from DCCV possibly relat  . Benign neoplasm of ascending colon   . Benign neoplasm of descending colon   . Benign neoplasm of sigmoid colon   . Blood in stool 01/21/2015  . Breath shortness 08/20/2013  . CAFL (chronic airflow limitation) ([place]) 12/16/2014  . Cancer ([place])    vulvar cancer  . CCF (congestive cardiac failure) ([place]) 08/03/2013   Overview:  ACUTE SYSTOLIC   . Chest pain 08/20/2013  . CHF (congestive heart failure) ([place])   . CHF (congestive heart failure) ([place])   . Chronic anticoagulation    She is on Xarelto for Afib  . Chronic atrial fibrillation ([place])   . Chronic combined systolic and diastolic CHF, NYHA class 3 ([place]) 01/28/2015  . Chronic diastolic heart failure (  [place]) 05/05/2015  . Condyloma acuminatum of vulva 08/02/2014  . COPD (chronic obstructive pulmonary disease) ([place]) 08/13/2015  . Diverticulitis large intestine w/o perforation or abscess w/bleeding 01/28/2015  . Diverticulitis of large intestine without perforation or abscess with bleeding   . Dysmetabolic syndrome [id number]   Overview:  BG-166 06/15/2013; elevated BMI; central obesity   . Dyspepsia   . Dyspnea   . Echocardiogram abnormal    Mitral regurgitation, tricuspic regurgitation  . Essential hypertension 10/07/2014   Trial off acei 10/07/2014  Due to atypical sob (talking =  walking)> no real change 11/18/2014    . Family history of adverse reaction to anesthesia    sister gets very nauseated and vomits  . Gastritis without bleeding   . GERD (gastroesophageal reflux disease)   . History of kidney stones   . History of stress test    Myoview 8/16: EF 42%, anteroseptal, inferoseptal, anterior, apical anterior and apical defect (likely represents breast attenuation versus scar), no ischemia; intermediate risk because of low EF  . HPV (human papilloma virus) anogenital infection    03/21/12 PAP + HR HPV  . HTN (hypertension) 12/16/2014  . Hypertension   . Hypertensive heart disease 05/12/2016  . Hypertensive urgency 01/21/2015  . Insulin resistance   . Kidney stone   . Kidney stones   . Malignant neoplasm of vulva ([place]) 06/19/2014   Overview:  Holly Smith is a 73 yo who was referred by Dr Ouida Sills for a vulvar lesion. 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 The specimen had negative margins, but was within 2 mm of the cancer.    . Mitral regurgitation 04/23/2013   Overview:  "Moderate to severe" by TTE, "mild to mod" by TEE  Overview:  "Moderate to severe" by TTE, "mild to mod" by TEE  . Obesity   . OSA (obstructive sleep apnea) 11/27/2014   07/2013 RDI 17/h CPAP 8   . OSA on CPAP   . Other specified diseases of esophagus   . Postoperative nausea and vomiting 1983   many years ago with hysterectomy  . PVC (premature ventricular contraction) 12/16/2014  . Sepsis ([place]) 12/13/2016  . TI (tricuspid incompetence) 04/23/2013   Overview:  "moderate" by TTE   . Urolithiasis   . Vertigo   . Vulvar cancer ([place]) 2016   invasive verrucous carcinoma of the vulva. History of genital warts and vulvar condyloma  . Vulvar cancer, carcinoma ([place]) 05/10/2014    Past Surgical History:  Procedure Laterality Date  . ABDOMINAL HYSTERECTOMY    . APPENDECTOMY    . BILATERAL SALPINGOOPHORECTOMY   2003   benign ovarian cancer  . CARDIOVERSION  2014-2015   "[place]"  . COLONOSCOPY WITH PROPOFOL N/A 06/10/2015   Procedure: COLONOSCOPY WITH PROPOFOL;  Surgeon: Lucilla Lame, MD;  Location: [facility] ENDOSCOPY;  Service: Endoscopy;  Laterality: N/A;  . CYSTOSCOPY W/ RETROGRADES Bilateral 10/19/2016   Procedure: CYSTOSCOPY WITH RETROGRADE PYELOGRAM;  Surgeon: Hollice Espy, MD;  Location: [facility] ORS;  Service: Urology;  Laterality: Bilateral;  . CYSTOSCOPY W/ URETERAL STENT PLACEMENT Left 12/29/2016   Procedure: CYSTOSCOPY WITH STENT REPLACEMENT;  Surgeon: Hollice Espy, MD;  Location: [facility] ORS;  Service: Urology;  Laterality: Left;  . CYSTOSCOPY WITH BIOPSY N/A 10/19/2016   Procedure: CYSTOSCOPY WITH BLADDER BIOPSY, VAGINAL WALL BIOPSY;  Surgeon: Hollice Espy, MD;  Location: [facility] ORS;  Service: Urology;  Laterality: N/A;  . CYSTOSCOPY WITH STENT PLACEMENT  Left 12/14/2016   Procedure: CYSTOSCOPY WITH STENT PLACEMENT;  Surgeon: Hollice Espy, MD;  Location: [facility] ORS;  Service: Urology;  Laterality: Left;  . ESOPHAGOGASTRODUODENOSCOPY (EGD) WITH PROPOFOL N/A 10/05/2016   Surgeon: Lucilla Lame, MD;  Location: [facility] ENDOSCOPY;  Results: Barrett's Esophagus- repeat in 3 years 09/2019  . LITHOTRIPSY    . STONE EXTRACTION WITH BASKET Left 12/29/2016   Procedure: STONE EXTRACTION WITH BASKET;  Surgeon: Hollice Espy, MD;  Location: [facility] ORS;  Service: Urology;  Laterality: Left;  . TONSILLECTOMY    . URETEROSCOPY Left 12/29/2016   Procedure: URETEROSCOPY;  Surgeon: Hollice Espy, MD;  Location: [facility] ORS;  Service: Urology;  Laterality: Left;  Marland Kitchen VULVECTOMY Right 05/10/2014   Excisional biopsy of the superior right labial majora mass; [place] PARTIAL  07/02/2014   Re-excision and sentinel node dissection at [facility].      Current Outpatient Medications  Medication Sig Dispense Refill  . acetaminophen (TYLENOL) 325 MG tablet Take 2 tablets (650 mg total) by mouth  every 6 (six) hours as needed for mild pain (or Fever >/= 101).    Marland Kitchen BIOTIN PO Take by mouth.    . clobetasol ointment (TEMOVATE) [id number] % Apply 1 application topically as needed (irritaion).    . furosemide (LASIX) 40 MG tablet TAKE 2 TABLETS BY MOUTH EVERY DAY 180 tablet 0  . irbesartan (AVAPRO) 75 MG tablet TAKE 1 TABLET BY MOUTH EVERY DAY (Patient taking differently: Pt states shes taking one half tablet by mouth daily.) 90 tablet 2  . metoprolol succinate (TOPROL-XL) 50 MG 24 hr tablet TAKE 1 TABLET BY MOUTH TWICE A DAY 180 tablet 1  . metoprolol tartrate (LOPRESSOR) 100 MG tablet TAKE 1 TABLET BY MOUTH TWICE A DAY 180 tablet 1  . omeprazole (PRILOSEC) 20 MG capsule TAKE 1 CAPSULE BY MOUTH EVERY DAY 30 capsule 11  . potassium chloride SA (K-DUR,KLOR-CON) 20 MEQ tablet Take 20 mEq by mouth as needed (Build up blood).    . triamcinolone (NASACORT ALLERGY 24HR) 55 MCG/ACT AERO nasal inhaler Place 2 sprays into the nose daily as needed.     . vitamin B-12 (CYANOCOBALAMIN) 1000 MCG tablet Take 1,000 mcg by mouth every other day.     Alveda Reasons 20 MG TABS tablet TAKE 1 TABLET (20 MG TOTAL) BY MOUTH DAILY WITH SUPPER. 90 tablet 1   Current Facility-Administered Medications  Medication Dose Route Frequency Provider Last Rate Last Dose  . albuterol (PROVENTIL) (2.5 MG/3ML) 0.083% nebulizer solution 2.5 mg  2.5 mg Nebulization Once Juline Patch, MD        Allergies:   Ciprofloxacin    Social History:  The patient  reports that she quit smoking about 6 years ago. Her smoking use included cigarettes. She has a 50.00 pack-year smoking history. She has never used smokeless tobacco. She reports that she does not drink alcohol or use drugs.   Family History:  The patient's family history includes Diabetes in her mother; Heart attack in her brother and father; Heart disease in her sister; Hypertension in her mother; Pancreatic cancer in her brother; Prostate cancer (age of onset: 44) in her brother;  Stroke in her mother.    ROS:  Please see the history of present illness.   Otherwise, review of systems are positive for DOE.   All other systems are reviewed and negative.    PHYSICAL EXAM: VS:  BP 120/84   Pulse (!) 101   Ht 5' (1.524 m)  Wt 232 lb 12.8 oz (105.6 kg)   SpO2 95%   BMI 45.47 kg/m  , BMI Body mass index is 45.47 kg/m. GEN: Well nourished, well developed, in no acute distress  HEENT: normal  Neck: no JVD, carotid bruits, or masses Cardiac: RRR; no murmurs, rubs, or gallops,no edema  Respiratory:  clear to auscultation bilaterally, normal work of breathing GI: soft, nontender, nondistended, + BS Holly: no deformity or atrophy  Skin: warm and dry, no rash Neuro:  Strength and sensation are intact Psych: euthymic mood, full affect   EKG:   The ekg ordered today demonstrates AFib, rate controlled, nonspecific ST changes   Recent Labs: 04/24/2018: BUN 18; Creatinine, Ser 0.95; Potassium 3.6; Sodium 140   Lipid Panel    Component Value Date/Time   CHOL 171 04/24/2018 1215   CHOL 165 03/18/2016 1056   TRIG 119 04/24/2018 1215   HDL 34 (L) 04/24/2018 1215   HDL 42 03/18/2016 1056   CHOLHDL 5.0 04/24/2018 1215   VLDL 24 04/24/2018 1215   LDLCALC 113 (H) 04/24/2018 1215   LDLCALC 89 03/18/2016 1056     Other studies Reviewed: Additional studies/ records that were reviewed today with results demonstrating: 2016 stress test without ischemia    ASSESSMENT AND PLAN:  1. Chronic atrial fibrillation: Rate controlled.  Refill metoprolol.  Change to all metoprolol tartrate, 150 mg BID. CP concerning for ischemia.  Discussed cath, but she prefers stress since she has not had further sx. 2. Chronic diastolic heart failure: Appears euvolemic.   3. HTN: The current medical regimen is effective;  continue present plan and medications. 4. Hyperlipidemia: LDL 113 5. Morbid obesity: Has declined referral for weight loss surgery in the past.  She has gained weight during  the pandemic as well.   Current medicines are reviewed at length with the patient today.  The patient concerns regarding her medicines were addressed.  The following changes have been made:  No change  Labs/ tests ordered today include: stress test No orders of the defined types were placed in this encounter.   Recommend 150 minutes/week of aerobic exercise Low fat, low carb, high fiber diet recommended  Disposition:   FU for stress test   Signed, Larae Grooms, MD  01/08/2019 1:51 PM    [facility] [place], [place], [place]  [address] Phone: [phone]; Fax: [phone]

## 2019-01-07 NOTE — Progress Notes (Signed)
Cardiology Office Note   Date:  01/08/2019   ID:  Holly Smith 16-Dec-1945, MRN [id number]  PCP:  Holly Patch, MD    No chief complaint on file.  AFib  Wt Readings from Last 3 Encounters:  01/08/19 232 lb 12.8 oz (105.6 kg)  12/20/18 229 lb 7 oz (104.1 kg)  05/31/18 226 lb 8 oz (102.7 kg)       History of Present Illness: Holly Smith is a 73 y.o. female  Who has chronic atrial fibrillation that is now rate controlled and h/o tachycardia mediated cardiomyopathy in 2015. She was cardioverted at [facility] EF later improved but she has chronic diastolic dysfunction. Last echo was in 2016. EF was 55-60%. Mild MR noted. She is on Xarelto for a/c.  She went back into atrial fibrillation several years ago which is now chronic.  Previously seen by Dr. Rayann Smith and not felt to be a good candidate for ablation.  She has failed propafenone  and amiodarone in the past.  She is on metoprolol for rate control. No h/o CAD. Other medical problems include HTN, HLD and obesity.  In 2019: "We discussed weight loss surgery referral.  She had a friend who died many years ago during weight loss surgery so she is hesitant.  She prefers the Atkins diet to try to lose weight.  She has done well with this in the past. "  Since the last visit, she had some palpitations.  This is rare.  She was sitting a few days ago when this happened.  SHe had some chest tightness with this episode.  It lasted about 30 minutes.  No recurrence of chest pain.  No CP while walking from her car to the office.  She hd to walk up a slight incline and had only some mild SHOB.   Denies :  Dizziness. Leg edema. Nitroglycerin use. Orthopnea.  Paroxysmal nocturnal dyspnea. Syncope.     Past Medical History:  Diagnosis Date  . A-fib ([place])   . Abnormal Pap smear of vagina and vaginal HPV   . Acute diastolic CHF (congestive heart failure) ([place]) 01/21/2015  . Acute diverticulitis 01/28/2015  . Acute on chronic  diastolic heart failure ([place]) 01/21/2015  . Anticoagulation adequate 08/02/2014   Overview:  Xarelto 20hs; CHADS=1+ htn, female, age  Overview:  Xarelto 20hs; CHADS=1+ htn, female, age  . Arthritis    "bones ache" (01/23/2015)  . Atrial fibrillation, persistent ([place]) 08/13/2015   Overview:  symptomatic (fatigue, DOE, decreased exercise tolerance, palpitations), drug refractory (propafenone, amiodarone), complicated recovery from DCCV possibly related to repiratory obstruction [facility] (patient fear of DCCV)  Overview:  symptomatic (fatigue, DOE, decreased exercise tolerance, palpitations), drug refractory (propafenone, amiodarone), complicated recovery from DCCV possibly relat  . Benign neoplasm of ascending colon   . Benign neoplasm of descending colon   . Benign neoplasm of sigmoid colon   . Blood in stool 01/21/2015  . Breath shortness 08/20/2013  . CAFL (chronic airflow limitation) ([place]) 12/16/2014  . Cancer ([place])    vulvar cancer  . CCF (congestive cardiac failure) ([place]) 08/03/2013   Overview:  ACUTE SYSTOLIC   . Chest pain 08/20/2013  . CHF (congestive heart failure) ([place])   . CHF (congestive heart failure) ([place])   . Chronic anticoagulation    She is on Xarelto for Afib  . Chronic atrial fibrillation ([place])   . Chronic combined systolic and diastolic CHF, NYHA class 3 ([place]) 01/28/2015  . Chronic diastolic heart failure (  [place]) 05/05/2015  . Condyloma acuminatum of vulva 08/02/2014  . COPD (chronic obstructive pulmonary disease) ([place]) 08/13/2015  . Diverticulitis large intestine w/o perforation or abscess w/bleeding 01/28/2015  . Diverticulitis of large intestine without perforation or abscess with bleeding   . Dysmetabolic syndrome [id number]   Overview:  BG-166 06/15/2013; elevated BMI; central obesity   . Dyspepsia   . Dyspnea   . Echocardiogram abnormal    Mitral regurgitation, tricuspic regurgitation  . Essential hypertension 10/07/2014   Trial off acei 10/07/2014  Due to atypical sob (talking =  walking)> no real change 11/18/2014    . Family history of adverse reaction to anesthesia    sister gets very nauseated and vomits  . Gastritis without bleeding   . GERD (gastroesophageal reflux disease)   . History of kidney stones   . History of stress test    Myoview 8/16: EF 42%, anteroseptal, inferoseptal, anterior, apical anterior and apical defect (likely represents breast attenuation versus scar), no ischemia; intermediate risk because of low EF  . HPV (human papilloma virus) anogenital infection    03/21/12 PAP + HR HPV  . HTN (hypertension) 12/16/2014  . Hypertension   . Hypertensive heart disease 05/12/2016  . Hypertensive urgency 01/21/2015  . Insulin resistance   . Kidney stone   . Kidney stones   . Malignant neoplasm of vulva ([place]) 06/19/2014   Overview:  Ms Holly Smith is a 73 yo who was referred by Dr Holly Smith for a vulvar lesion. 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 The specimen had negative margins, but was within 2 mm of the cancer.    . Mitral regurgitation 04/23/2013   Overview:  "Moderate to severe" by TTE, "mild to mod" by TEE  Overview:  "Moderate to severe" by TTE, "mild to mod" by TEE  . Obesity   . OSA (obstructive sleep apnea) 11/27/2014   07/2013 RDI 17/h CPAP 8   . OSA on CPAP   . Other specified diseases of esophagus   . Postoperative nausea and vomiting 1983   many years ago with hysterectomy  . PVC (premature ventricular contraction) 12/16/2014  . Sepsis ([place]) 12/13/2016  . TI (tricuspid incompetence) 04/23/2013   Overview:  "moderate" by TTE   . Urolithiasis   . Vertigo   . Vulvar cancer ([place]) 2016   invasive verrucous carcinoma of the vulva. History of genital warts and vulvar condyloma  . Vulvar cancer, carcinoma ([place]) 05/10/2014    Past Surgical History:  Procedure Laterality Date  . ABDOMINAL HYSTERECTOMY    . APPENDECTOMY    . BILATERAL SALPINGOOPHORECTOMY   2003   benign ovarian cancer  . CARDIOVERSION  2014-2015   "[place]"  . COLONOSCOPY WITH PROPOFOL N/A 06/10/2015   Procedure: COLONOSCOPY WITH PROPOFOL;  Surgeon: Lucilla Lame, MD;  Location: [facility] ENDOSCOPY;  Service: Endoscopy;  Laterality: N/A;  . CYSTOSCOPY W/ RETROGRADES Bilateral 10/19/2016   Procedure: CYSTOSCOPY WITH RETROGRADE PYELOGRAM;  Surgeon: Hollice Espy, MD;  Location: [facility] ORS;  Service: Urology;  Laterality: Bilateral;  . CYSTOSCOPY W/ URETERAL STENT PLACEMENT Left 12/29/2016   Procedure: CYSTOSCOPY WITH STENT REPLACEMENT;  Surgeon: Hollice Espy, MD;  Location: [facility] ORS;  Service: Urology;  Laterality: Left;  . CYSTOSCOPY WITH BIOPSY N/A 10/19/2016   Procedure: CYSTOSCOPY WITH BLADDER BIOPSY, VAGINAL WALL BIOPSY;  Surgeon: Hollice Espy, MD;  Location: [facility] ORS;  Service: Urology;  Laterality: N/A;  . CYSTOSCOPY WITH STENT PLACEMENT  Left 12/14/2016   Procedure: CYSTOSCOPY WITH STENT PLACEMENT;  Surgeon: Hollice Espy, MD;  Location: [facility] ORS;  Service: Urology;  Laterality: Left;  . ESOPHAGOGASTRODUODENOSCOPY (EGD) WITH PROPOFOL N/A 10/05/2016   Surgeon: Lucilla Lame, MD;  Location: [facility] ENDOSCOPY;  Results: Barrett's Esophagus- repeat in 3 years 09/2019  . LITHOTRIPSY    . STONE EXTRACTION WITH BASKET Left 12/29/2016   Procedure: STONE EXTRACTION WITH BASKET;  Surgeon: Hollice Espy, MD;  Location: [facility] ORS;  Service: Urology;  Laterality: Left;  . TONSILLECTOMY    . URETEROSCOPY Left 12/29/2016   Procedure: URETEROSCOPY;  Surgeon: Hollice Espy, MD;  Location: [facility] ORS;  Service: Urology;  Laterality: Left;  Marland Kitchen VULVECTOMY Right 05/10/2014   Excisional biopsy of the superior right labial majora mass; [place] PARTIAL  07/02/2014   Re-excision and sentinel node dissection at [facility].      Current Outpatient Medications  Medication Sig Dispense Refill  . acetaminophen (TYLENOL) 325 MG tablet Take 2 tablets (650 mg total) by mouth  every 6 (six) hours as needed for mild pain (or Fever >/= 101).    Marland Kitchen BIOTIN PO Take by mouth.    . clobetasol ointment (TEMOVATE) [id number] % Apply 1 application topically as needed (irritaion).    . furosemide (LASIX) 40 MG tablet TAKE 2 TABLETS BY MOUTH EVERY DAY 180 tablet 0  . irbesartan (AVAPRO) 75 MG tablet TAKE 1 TABLET BY MOUTH EVERY DAY (Patient taking differently: Pt states shes taking one half tablet by mouth daily.) 90 tablet 2  . metoprolol succinate (TOPROL-XL) 50 MG 24 hr tablet TAKE 1 TABLET BY MOUTH TWICE A DAY 180 tablet 1  . metoprolol tartrate (LOPRESSOR) 100 MG tablet TAKE 1 TABLET BY MOUTH TWICE A DAY 180 tablet 1  . omeprazole (PRILOSEC) 20 MG capsule TAKE 1 CAPSULE BY MOUTH EVERY DAY 30 capsule 11  . potassium chloride SA (K-DUR,KLOR-CON) 20 MEQ tablet Take 20 mEq by mouth as needed (Build up blood).    . triamcinolone (NASACORT ALLERGY 24HR) 55 MCG/ACT AERO nasal inhaler Place 2 sprays into the nose daily as needed.     . vitamin B-12 (CYANOCOBALAMIN) 1000 MCG tablet Take 1,000 mcg by mouth every other day.     Alveda Reasons 20 MG TABS tablet TAKE 1 TABLET (20 MG TOTAL) BY MOUTH DAILY WITH SUPPER. 90 tablet 1   Current Facility-Administered Medications  Medication Dose Route Frequency Provider Last Rate Last Dose  . albuterol (PROVENTIL) (2.5 MG/3ML) 0.083% nebulizer solution 2.5 mg  2.5 mg Nebulization Once Holly Patch, MD        Allergies:   Ciprofloxacin    Social History:  The patient  reports that she quit smoking about 6 years ago. Her smoking use included cigarettes. She has a 50.00 pack-year smoking history. She has never used smokeless tobacco. She reports that she does not drink alcohol or use drugs.   Family History:  The patient's family history includes Diabetes in her mother; Heart attack in her brother and father; Heart disease in her sister; Hypertension in her mother; Pancreatic cancer in her brother; Prostate cancer (age of onset: 71) in her brother;  Stroke in her mother.    ROS:  Please see the history of present illness.   Otherwise, review of systems are positive for DOE.   All other systems are reviewed and negative.    PHYSICAL EXAM: VS:  BP 120/84   Pulse (!) 101   Ht 5' (1.524 m)  Wt 232 lb 12.8 oz (105.6 kg)   SpO2 95%   BMI 45.47 kg/m  , BMI Body mass index is 45.47 kg/m. GEN: Well nourished, well developed, in no acute distress  HEENT: normal  Neck: no JVD, carotid bruits, or masses Cardiac: RRR; no murmurs, rubs, or gallops,no edema  Respiratory:  clear to auscultation bilaterally, normal work of breathing GI: soft, nontender, nondistended, + BS MS: no deformity or atrophy  Skin: warm and dry, no rash Neuro:  Strength and sensation are intact Psych: euthymic mood, full affect   EKG:   The ekg ordered today demonstrates AFib, rate controlled, nonspecific ST changes   Recent Labs: 04/24/2018: BUN 18; Creatinine, Ser 0.95; Potassium 3.6; Sodium 140   Lipid Panel    Component Value Date/Time   CHOL 171 04/24/2018 1215   CHOL 165 03/18/2016 1056   TRIG 119 04/24/2018 1215   HDL 34 (L) 04/24/2018 1215   HDL 42 03/18/2016 1056   CHOLHDL 5.0 04/24/2018 1215   VLDL 24 04/24/2018 1215   LDLCALC 113 (H) 04/24/2018 1215   LDLCALC 89 03/18/2016 1056     Other studies Reviewed: Additional studies/ records that were reviewed today with results demonstrating: 2016 stress test without ischemia    ASSESSMENT AND PLAN:  1. Chronic atrial fibrillation: Rate controlled.  Refill metoprolol.  Change to all metoprolol tartrate, 150 mg BID. CP concerning for ischemia.  Discussed cath, but she prefers stress since she has not had further sx. 2. Chronic diastolic heart failure: Appears euvolemic.   3. HTN: The current medical regimen is effective;  continue present plan and medications. 4. Hyperlipidemia: LDL 113 5. Morbid obesity: Has declined referral for weight loss surgery in the past.  She has gained weight during  the pandemic as well.   Current medicines are reviewed at length with the patient today.  The patient concerns regarding her medicines were addressed.  The following changes have been made:  No change  Labs/ tests ordered today include: stress test No orders of the defined types were placed in this encounter.   Recommend 150 minutes/week of aerobic exercise Low fat, low carb, high fiber diet recommended  Disposition:   FU for stress test   Signed, Larae Grooms, MD  01/08/2019 1:51 PM    [facility] [place], [place], [place]  [address] Phone: [phone]; Fax: [phone]

## 2019-01-08 ENCOUNTER — Encounter: Payer: Self-pay | Admitting: Interventional Cardiology

## 2019-01-08 ENCOUNTER — Ambulatory Visit: Payer: Medicare HMO | Admitting: Interventional Cardiology

## 2019-01-08 ENCOUNTER — Other Ambulatory Visit: Payer: Self-pay

## 2019-01-08 ENCOUNTER — Telehealth: Payer: Self-pay | Admitting: Interventional Cardiology

## 2019-01-08 VITALS — BP 120/84 | HR 101 | Ht 60.0 in | Wt 232.8 lb

## 2019-01-08 DIAGNOSIS — E782 Mixed hyperlipidemia: Secondary | ICD-10-CM | POA: Diagnosis not present

## 2019-01-08 DIAGNOSIS — I5032 Chronic diastolic (congestive) heart failure: Secondary | ICD-10-CM | POA: Diagnosis not present

## 2019-01-08 DIAGNOSIS — R079 Chest pain, unspecified: Secondary | ICD-10-CM

## 2019-01-08 DIAGNOSIS — I5030 Unspecified diastolic (congestive) heart failure: Secondary | ICD-10-CM

## 2019-01-08 DIAGNOSIS — I4819 Other persistent atrial fibrillation: Secondary | ICD-10-CM

## 2019-01-08 DIAGNOSIS — I110 Hypertensive heart disease with heart failure: Secondary | ICD-10-CM

## 2019-01-08 MED ORDER — METOPROLOL TARTRATE 100 MG PO TABS: 150.0000 mg | ORAL_TABLET | Freq: Two times a day (BID) | ORAL | 3 refills | Status: DC

## 2019-01-08 NOTE — Addendum Note (Signed)
Addended by: Drue Novel I on: 01/08/2019 05:24 PM   Modules accepted: Orders

## 2019-01-08 NOTE — Patient Instructions (Signed)
Medication Instructions:  Your physician has recommended you make the following change in your medication:   1. STOP: metoprolol succinate (Toprol-XL)  2. INCREASE: metoprolol tartrate (lopressor) 100 mg tablet: Take 1 1/2 tablets (150 mg) by mouth TWICE a day  *If you need a refill on your cardiac medications before your next appointment, please call your pharmacy*  Lab Work: None ordered If you have labs (blood work) drawn today and your tests are completely normal, you will receive your results only by: Marland Kitchen MyChart Message (if you have MyChart) OR . A paper copy in the mail If you have any lab test that is abnormal or we need to change your treatment, we will call you to review the results.  Testing/Procedures: Your physician has requested that you have a lexiscan myoview. For further information please visit [email or web address]. Please follow instruction sheet, as given.  Follow-Up: At [facility], you and your health needs are our priority.  As part of our continuing mission to provide you with exceptional heart care, we have created designated Provider Care Teams.  These Care Teams include your primary Cardiologist (physician) and Advanced Practice Providers (APPs -  Physician Assistants and Nurse Practitioners) who all work together to provide you with the care you need, when you need it.  Your next appointment:   6 months  The format for your next appointment:   Either In Person or Virtual  Provider:   You may see Larae Grooms, MD or one of the following Advanced Practice Providers on your designated Care Team:    Melina Copa, PA-C  Ermalinda Barrios, PA-C   Other Instructions

## 2019-01-08 NOTE — Telephone Encounter (Signed)
New message:    Patient calling about a up coming appt she would like to discuss the other option Dr. Irish Lack discussed please call patient.

## 2019-01-08 NOTE — Telephone Encounter (Signed)
Called and spoke to patient. She states that she would like to move forward with the heart catheterization instead of the [place]. Patient scheduled for [facility] on 01/18/19 at 9:00 AM. Reviewed instructions below with patient. She verbalized understanding. Dr. Irish Lack has been made aware.    [place] [address] Dept: [phone] Loc: [phone]  Donyelle Hoen  01/08/2019  You are scheduled for a Cardiac Catheterization on Thursday, October 29 with Dr. Larae Grooms.  1. Please arrive at the [facility] (Main Entrance A) at [facility]: [address] [place], [place] [address] at 7:00 AM (This time is two hours before your procedure to ensure your preparation). Free valet parking service is available.   Special note: Every effort is made to have your procedure done on time. Please understand that emergencies sometimes delay scheduled procedures.  2. Diet: Do not eat solid foods after midnight.  The patient may have clear liquids until 5am upon the day of the procedure.  3. Labs: You will need to have blood drawn on Monday, October 26 at 10:30 AM [facility] at [facility]. [address], [place] COVID-19 Testing will be done on 01/15/19 at 11:15 AM at [place] at [id number], [place], [place] [address]. Once you arrive at the testing site, stay in the right hand lane, go under the building overhang not the tent. If you are tested under the tent your results may not be back before your procedure. Please be on time for your appointment.  After your swab you will be given a mask to wear and instructed to go home and quarantine/no visitors until after your procedure. If you test positive you will be notified and your procedure will be cancelled.    4. Medication instructions in  preparation for your procedure:   Contrast Allergy: No   - DO NOT take your Xarelto for 2 days prior to your procedure  - DO NOT take you furosemide (lasix) the morning of the procedure   On the morning of your procedure, take a baby Aspirin 81 mg and any morning medicines NOT listed above.  You may use sips of water.  5. Plan for one night stay--bring personal belongings. 6. Bring a current list of your medications and current insurance cards. 7. You MUST have a responsible person to drive you home. 8. Someone MUST be with you the first 24 hours after you arrive home or your discharge will be delayed. 9. Please wear clothes that are easy to get on and off and wear slip-on shoes.  Thank you for allowing [place] to care for you!   -- [facility]

## 2019-01-09 ENCOUNTER — Other Ambulatory Visit: Payer: Self-pay | Admitting: Interventional Cardiology

## 2019-01-09 NOTE — Telephone Encounter (Signed)
Prescription refill request for Xarelto received.   Last office visit: Holly Smith (01-08-2019) Weight: 105.6 kg Age: 73 y.o. Scr: 0.95 (04-24-2018) CrCl: 85 ml /min   Prescription refill sent.

## 2019-01-12 ENCOUNTER — Other Ambulatory Visit: Payer: Self-pay | Admitting: Interventional Cardiology

## 2019-01-15 ENCOUNTER — Other Ambulatory Visit (HOSPITAL_COMMUNITY)
Admission: RE | Admit: 2019-01-15 | Discharge: 2019-01-15 | Disposition: A | Discharge status: Home / Self Care | Payer: Medicare HMO | Source: Ambulatory Visit | Attending: Interventional Cardiology | Admitting: Interventional Cardiology

## 2019-01-15 ENCOUNTER — Other Ambulatory Visit: Payer: Self-pay

## 2019-01-15 ENCOUNTER — Encounter (HOSPITAL_COMMUNITY): Payer: Medicare HMO

## 2019-01-15 ENCOUNTER — Other Ambulatory Visit: Payer: Medicare HMO | Admitting: *Deleted

## 2019-01-15 DIAGNOSIS — Z01812 Encounter for preprocedural laboratory examination: Secondary | ICD-10-CM | POA: Diagnosis not present

## 2019-01-15 DIAGNOSIS — R079 Chest pain, unspecified: Secondary | ICD-10-CM | POA: Diagnosis not present

## 2019-01-15 DIAGNOSIS — Z20828 Contact with and (suspected) exposure to other viral communicable diseases: Secondary | ICD-10-CM | POA: Insufficient documentation

## 2019-01-15 LAB — BASIC METABOLIC PANEL
BUN/Creatinine Ratio: 19 (ref 12–28)
BUN: 21 mg/dL (ref 8–27)
CO2: 24 mmol/L (ref 20–29)
Calcium: 9.8 mg/dL (ref 8.7–10.3)
Chloride: 100 mmol/L (ref 96–106)
Creatinine, Ser: 1.12 mg/dL — ABNORMAL HIGH (ref 0.57–1.00)
GFR calc Af Amer: 56 mL/min/{1.73_m2} — ABNORMAL LOW (ref >59)
GFR calc non Af Amer: 48 mL/min/{1.73_m2} — ABNORMAL LOW (ref >59)
Glucose: 192 mg/dL — ABNORMAL HIGH (ref 65–99)
Potassium: 5.6 mmol/L — ABNORMAL HIGH (ref 3.5–5.2)
Sodium: 138 mmol/L (ref 134–144)

## 2019-01-15 LAB — CBC
Hematocrit: 41.8 % (ref 34.0–46.6)
Hemoglobin: 13.7 g/dL (ref 11.1–15.9)
MCH: 31.3 pg (ref 26.6–33.0)
MCHC: 32.8 g/dL (ref 31.5–35.7)
MCV: 95 fL (ref 79–97)
Platelets: 247 10*3/uL (ref 150–450)
RBC: 4.38 x10E6/uL (ref 3.77–5.28)
RDW: 12.3 % (ref 11.7–15.4)
WBC: 8.2 10*3/uL (ref 3.4–10.8)

## 2019-01-16 LAB — NOVEL CORONAVIRUS, NAA (HOSP ORDER, SEND-OUT TO REF LAB; TAT 18-24 HRS): SARS-CoV-2, NAA: NOT DETECTED

## 2019-01-16 NOTE — Telephone Encounter (Signed)
Cardiac catheterization was discussed with the patient fully. The patient understands that risks include but are not limited to stroke (1 in 1000), death (1 in 32), kidney failure [usually temporary] (1 in 500), bleeding (1 in 200), allergic reaction [possibly serious] (1 in 200).  The patient understands and is willing to proceed.

## 2019-01-17 ENCOUNTER — Telehealth: Payer: Self-pay | Admitting: *Deleted

## 2019-01-17 NOTE — Telephone Encounter (Addendum)
Pt contacted pre-catheterization scheduled at [facility] for: Thursday January 18, 2019 9 AM Verified arrival time and place: [facility]) at: 7 AM   No solid food after midnight prior to cath, clear liquids until 5 AM day of procedure. Contrast allergy: no  Hold: Xarelto-none 01/16/19 until post procedure. Irbesartan-day before and day of procedure-GFR 71 Lasix-day before and day of procedure- GFR  48 Stop KCl-see lab results 01/15/19  Except hold medications AM meds can be  taken pre-cath with sip of water including: ASA 81 mg   Confirmed patient has responsible adult to drive home post procedure and observe 24 hours after arriving home: yes  Currently, due to Covid-19 pandemic, only one support person will be allowed with patient. Must be the same support person for that patient's entire stay, will be screened and required to wear a mask. They will be asked to wait in the waiting room for the duration of the patient's stay.  Patients are required to wear a mask when they enter the hospital.      COVID-19 Pre-Screening Questions:  . In the past 7 to 10 days have you had a cough,  shortness of breath, headache, congestion, fever (100 or greater) body aches, chills, sore throat, or sudden loss of taste or sense of smell? no . Have you been around anyone with known Covid 19? no . Have you been around anyone who is awaiting Covid 19 test results in the past 7 to 10 days? no . Have you been around anyone who has been exposed to Covid 19, or has mentioned symptoms of Covid 19 within the past 7 to 10 days? no   I reviewed procedure/mask/visitor instructions, Covid-19 screening questions with patient, she verbalized understanding, thanked me for call.

## 2019-01-18 ENCOUNTER — Encounter (HOSPITAL_COMMUNITY)
Admission: RE | Disposition: A | Discharge status: Home / Self Care | Payer: Self-pay | Source: Ambulatory Visit | Attending: Interventional Cardiology

## 2019-01-18 ENCOUNTER — Ambulatory Visit (HOSPITAL_COMMUNITY)
Admission: RE | Admit: 2019-01-18 | Discharge: 2019-01-18 | Disposition: A | Discharge status: Home / Self Care | Payer: Medicare HMO | Source: Ambulatory Visit | Attending: Interventional Cardiology | Admitting: Interventional Cardiology

## 2019-01-18 ENCOUNTER — Other Ambulatory Visit: Payer: Self-pay

## 2019-01-18 DIAGNOSIS — I110 Hypertensive heart disease with heart failure: Secondary | ICD-10-CM | POA: Diagnosis not present

## 2019-01-18 DIAGNOSIS — I5042 Chronic combined systolic (congestive) and diastolic (congestive) heart failure: Secondary | ICD-10-CM | POA: Diagnosis not present

## 2019-01-18 DIAGNOSIS — I25118 Atherosclerotic heart disease of native coronary artery with other forms of angina pectoris: Secondary | ICD-10-CM

## 2019-01-18 DIAGNOSIS — Z6841 Body Mass Index (BMI) 40.0 and over, adult: Secondary | ICD-10-CM | POA: Insufficient documentation

## 2019-01-18 DIAGNOSIS — K219 Gastro-esophageal reflux disease without esophagitis: Secondary | ICD-10-CM | POA: Insufficient documentation

## 2019-01-18 DIAGNOSIS — Z7901 Long term (current) use of anticoagulants: Secondary | ICD-10-CM | POA: Diagnosis not present

## 2019-01-18 DIAGNOSIS — Z7902 Long term (current) use of antithrombotics/antiplatelets: Secondary | ICD-10-CM | POA: Insufficient documentation

## 2019-01-18 DIAGNOSIS — Z881 Allergy status to other antibiotic agents status: Secondary | ICD-10-CM | POA: Insufficient documentation

## 2019-01-18 DIAGNOSIS — I429 Cardiomyopathy, unspecified: Secondary | ICD-10-CM | POA: Diagnosis not present

## 2019-01-18 DIAGNOSIS — M1990 Unspecified osteoarthritis, unspecified site: Secondary | ICD-10-CM | POA: Insufficient documentation

## 2019-01-18 DIAGNOSIS — I4819 Other persistent atrial fibrillation: Secondary | ICD-10-CM | POA: Diagnosis not present

## 2019-01-18 DIAGNOSIS — E785 Hyperlipidemia, unspecified: Secondary | ICD-10-CM | POA: Insufficient documentation

## 2019-01-18 DIAGNOSIS — Z87891 Personal history of nicotine dependence: Secondary | ICD-10-CM | POA: Insufficient documentation

## 2019-01-18 DIAGNOSIS — I208 Other forms of angina pectoris: Secondary | ICD-10-CM | POA: Insufficient documentation

## 2019-01-18 DIAGNOSIS — Z79899 Other long term (current) drug therapy: Secondary | ICD-10-CM | POA: Diagnosis not present

## 2019-01-18 DIAGNOSIS — J449 Chronic obstructive pulmonary disease, unspecified: Secondary | ICD-10-CM | POA: Insufficient documentation

## 2019-01-18 DIAGNOSIS — Z955 Presence of coronary angioplasty implant and graft: Secondary | ICD-10-CM

## 2019-01-18 DIAGNOSIS — G4733 Obstructive sleep apnea (adult) (pediatric): Secondary | ICD-10-CM | POA: Insufficient documentation

## 2019-01-18 DIAGNOSIS — I509 Heart failure, unspecified: Secondary | ICD-10-CM

## 2019-01-18 DIAGNOSIS — R079 Chest pain, unspecified: Secondary | ICD-10-CM | POA: Diagnosis present

## 2019-01-18 HISTORY — PX: CORONARY STENT INTERVENTION: CATH118234

## 2019-01-18 HISTORY — PX: LEFT HEART CATH AND CORONARY ANGIOGRAPHY: CATH118249

## 2019-01-18 LAB — POTASSIUM: Potassium: 4.1 mmol/L (ref 3.5–5.1)

## 2019-01-18 LAB — POCT ACTIVATED CLOTTING TIME
Activated Clotting Time: 301 seconds
Activated Clotting Time: 312 seconds

## 2019-01-18 SURGERY — LEFT HEART CATH AND CORONARY ANGIOGRAPHY
Anesthesia: LOCAL

## 2019-01-18 MED ORDER — VERAPAMIL HCL 2.5 MG/ML IV SOLN
INTRAVENOUS | Status: DC | PRN
  Administered 2019-01-18: 10 mL via INTRA_ARTERIAL

## 2019-01-18 MED ORDER — MIDAZOLAM HCL 2 MG/2ML IJ SOLN
INTRAMUSCULAR | Status: AC
  Filled 2019-01-18: qty 2

## 2019-01-18 MED ORDER — ASPIRIN 81 MG PO CHEW: 81.0000 mg | CHEWABLE_TABLET | ORAL | Status: DC

## 2019-01-18 MED ORDER — NITROGLYCERIN 0.4 MG SL SUBL
SUBLINGUAL_TABLET | SUBLINGUAL | Status: AC
  Administered 2019-01-18: 0.4 mg via SUBLINGUAL
  Filled 2019-01-18: qty 1

## 2019-01-18 MED ORDER — CLOPIDOGREL BISULFATE 300 MG PO TABS
ORAL_TABLET | ORAL | Status: AC
  Filled 2019-01-18: qty 1

## 2019-01-18 MED ORDER — HEPARIN SODIUM (PORCINE) 1000 UNIT/ML IJ SOLN
INTRAMUSCULAR | Status: AC
  Filled 2019-01-18: qty 1

## 2019-01-18 MED ORDER — LIDOCAINE HCL (PF) 1 % IJ SOLN
INTRAMUSCULAR | Status: DC | PRN
  Administered 2019-01-18: 3 mL

## 2019-01-18 MED ORDER — VERAPAMIL HCL 2.5 MG/ML IV SOLN
INTRAVENOUS | Status: AC
  Filled 2019-01-18: qty 2

## 2019-01-18 MED ORDER — SODIUM CHLORIDE 0.9% FLUSH: 3.0000 mL | INTRAVENOUS | Status: DC | PRN

## 2019-01-18 MED ORDER — HYDRALAZINE HCL 20 MG/ML IJ SOLN: 10.0000 mg | INTRAMUSCULAR | Status: DC | PRN

## 2019-01-18 MED ORDER — CLOPIDOGREL BISULFATE 75 MG PO TABS: 75.0000 mg | ORAL_TABLET | Freq: Every day | ORAL | Status: DC

## 2019-01-18 MED ORDER — NITROGLYCERIN 1 MG/10 ML FOR IR/CATH LAB
INTRA_ARTERIAL | Status: DC | PRN
  Administered 2019-01-18: 200 ug via INTRACORONARY

## 2019-01-18 MED ORDER — LABETALOL HCL 5 MG/ML IV SOLN: 10.0000 mg | INTRAVENOUS | Status: DC | PRN

## 2019-01-18 MED ORDER — NITROGLYCERIN 0.4 MG SL SUBL
0.4000 mg | SUBLINGUAL_TABLET | SUBLINGUAL | Status: DC | PRN
  Administered 2019-01-18: 11:00:00 0.4 mg via SUBLINGUAL

## 2019-01-18 MED ORDER — FENTANYL CITRATE (PF) 100 MCG/2ML IJ SOLN
INTRAMUSCULAR | Status: DC | PRN
  Administered 2019-01-18 (×3): 25 ug via INTRAVENOUS

## 2019-01-18 MED ORDER — PANTOPRAZOLE SODIUM 40 MG PO TBEC: 40.0000 mg | DELAYED_RELEASE_TABLET | Freq: Every day | ORAL | 1 refills | Status: DC

## 2019-01-18 MED ORDER — SODIUM CHLORIDE 0.9 % IV SOLN: 250.0000 mL | INTRAVENOUS | Status: DC | PRN

## 2019-01-18 MED ORDER — HEPARIN (PORCINE) IN NACL 1000-0.9 UT/500ML-% IV SOLN
INTRAVENOUS | Status: AC
  Filled 2019-01-18: qty 500

## 2019-01-18 MED ORDER — IOHEXOL 350 MG/ML SOLN
INTRAVENOUS | Status: DC | PRN
  Administered 2019-01-18: 165 mL

## 2019-01-18 MED ORDER — SODIUM CHLORIDE 0.9 % WEIGHT BASED INFUSION: 1.0000 mL/kg/h | INTRAVENOUS | Status: DC

## 2019-01-18 MED ORDER — SODIUM CHLORIDE 0.9% FLUSH: 3.0000 mL | Freq: Two times a day (BID) | INTRAVENOUS | Status: DC

## 2019-01-18 MED ORDER — LIDOCAINE HCL (PF) 1 % IJ SOLN
INTRAMUSCULAR | Status: AC
  Filled 2019-01-18: qty 30

## 2019-01-18 MED ORDER — NITROGLYCERIN 1 MG/10 ML FOR IR/CATH LAB
INTRA_ARTERIAL | Status: AC
  Filled 2019-01-18: qty 10

## 2019-01-18 MED ORDER — FENTANYL CITRATE (PF) 100 MCG/2ML IJ SOLN
INTRAMUSCULAR | Status: AC
  Filled 2019-01-18: qty 2

## 2019-01-18 MED ORDER — HEPARIN (PORCINE) IN NACL 1000-0.9 UT/500ML-% IV SOLN
INTRAVENOUS | Status: DC | PRN
  Administered 2019-01-18 (×2): 500 mL

## 2019-01-18 MED ORDER — HEPARIN SODIUM (PORCINE) 1000 UNIT/ML IJ SOLN
INTRAMUSCULAR | Status: DC | PRN
  Administered 2019-01-18: 7000 [IU] via INTRAVENOUS
  Administered 2019-01-18: 2000 [IU] via INTRAVENOUS
  Administered 2019-01-18: 5000 [IU] via INTRAVENOUS
  Administered 2019-01-18: 3000 [IU] via INTRAVENOUS

## 2019-01-18 MED ORDER — SODIUM CHLORIDE 0.9 % WEIGHT BASED INFUSION
3.0000 mL/kg/h | INTRAVENOUS | Status: AC
  Administered 2019-01-18: 3 mL/kg/h via INTRAVENOUS

## 2019-01-18 MED ORDER — ACETAMINOPHEN 325 MG PO TABS: 650.0000 mg | ORAL_TABLET | ORAL | Status: DC | PRN

## 2019-01-18 MED ORDER — CLOPIDOGREL BISULFATE 300 MG PO TABS
ORAL_TABLET | ORAL | Status: DC | PRN
  Administered 2019-01-18: 600 mg via ORAL

## 2019-01-18 MED ORDER — ONDANSETRON HCL 4 MG/2ML IJ SOLN: 4.0000 mg | Freq: Four times a day (QID) | INTRAMUSCULAR | Status: DC | PRN

## 2019-01-18 MED ORDER — CLOPIDOGREL BISULFATE 75 MG PO TABS: 75.0000 mg | ORAL_TABLET | Freq: Every day | ORAL | 0 refills | Status: DC

## 2019-01-18 MED ORDER — NITROGLYCERIN 0.4 MG SL SUBL: 0.4000 mg | SUBLINGUAL_TABLET | SUBLINGUAL | 2 refills | Status: AC | PRN

## 2019-01-18 MED ORDER — MIDAZOLAM HCL 2 MG/2ML IJ SOLN
INTRAMUSCULAR | Status: DC | PRN
  Administered 2019-01-18: 2 mg via INTRAVENOUS
  Administered 2019-01-18 (×2): 1 mg via INTRAVENOUS

## 2019-01-18 MED FILL — CLOPIDOGREL 75 MG TABLET: 75 | 30 days supply | Qty: 30 | Fill #0

## 2019-01-18 SURGICAL SUPPLY — 22 items
BALLN EMERGE MR 2.75X12 (BALLOONS) ×2
BALLN SAPPHIRE 2.0X15 (BALLOONS) ×2
BALLN SAPPHIRE ~~LOC~~ 3.0X15 (BALLOONS) ×1 IMPLANT
BALLOON EMERGE MR 2.75X12 (BALLOONS) IMPLANT
BALLOON SAPPHIRE 2.0X15 (BALLOONS) IMPLANT
CATH 5FR JL3.5 JR4 ANG PIG MP (CATHETERS) ×1 IMPLANT
CATH LAUNCHER 6FR EBU3.5 (CATHETERS) ×1 IMPLANT
DEVICE RAD COMP TR BAND LRG (VASCULAR PRODUCTS) ×1 IMPLANT
GLIDESHEATH SLEND SS 6F .021 (SHEATH) ×1 IMPLANT
GUIDEWIRE INQWIRE 1.5J.035X260 (WIRE) IMPLANT
INQWIRE 1.5J .035X260CM (WIRE) ×2
KIT ENCORE 26 ADVANTAGE (KITS) ×1 IMPLANT
KIT HEART LEFT (KITS) ×2 IMPLANT
KIT HEMO VALVE WATCHDOG (MISCELLANEOUS) ×1 IMPLANT
PACK CARDIAC CATHETERIZATION (CUSTOM PROCEDURE TRAY) ×2 IMPLANT
SHEATH PROBE COVER 6X72 (BAG) ×1 IMPLANT
STENT SYNERGY DES 2.25X28 (Permanent Stent) ×1 IMPLANT
STENT SYNERGY DES 2.75X20 (Permanent Stent) ×1 IMPLANT
TRANSDUCER W/STOPCOCK (MISCELLANEOUS) ×2 IMPLANT
TUBING CIL FLEX 10 FLL-RA (TUBING) ×2 IMPLANT
WIRE ASAHI PROWATER 180CM (WIRE) ×1 IMPLANT
WIRE FIGHTER CROSSING 190CM (WIRE) ×1 IMPLANT

## 2019-01-18 NOTE — Discharge Instructions (Signed)
Radial Site Care ° °This sheet gives you information about how to care for yourself after your procedure. Your health care provider may also give you more specific instructions. If you have problems or questions, contact your health care provider. °What can I expect after the procedure? °After the procedure, it is common to have: °· Bruising and tenderness at the catheter insertion area. °Follow these instructions at home: °Medicines °· Take over-the-counter and prescription medicines only as told by your health care provider. °Insertion site care °· Follow instructions from your health care provider about how to take care of your insertion site. Make sure you: °? Wash your hands with soap and water before you change your bandage (dressing). If soap and water are not available, use hand sanitizer. °? Change your dressing as told by your health care provider. °? Leave stitches (sutures), skin glue, or adhesive strips in place. These skin closures may need to stay in place for 2 weeks or longer. If adhesive strip edges start to loosen and curl up, you may trim the loose edges. Do not remove adhesive strips completely unless your health care provider tells you to do that. °· Check your insertion site every day for signs of infection. Check for: °? Redness, swelling, or pain. °? Fluid or blood. °? Pus or a bad smell. °? Warmth. °· Do not take baths, swim, or use a hot tub until your health care provider approves. °· You may shower 24-48 hours after the procedure, or as directed by your health care provider. °? Remove the dressing and gently wash the site with plain soap and water. °? Pat the area dry with a clean towel. °? Do not rub the site. That could cause bleeding. °· Do not apply powder or lotion to the site. °Activity ° °· For 24 hours after the procedure, or as directed by your health care provider: °? Do not flex or bend the affected arm. °? Do not push or pull heavy objects with the affected arm. °? Do not  drive yourself home from the hospital or clinic. You may drive 24 hours after the procedure unless your health care provider tells you not to. °? Do not operate machinery or power tools. °· Do not lift anything that is heavier than 10 lb (4.5 kg), or the limit that you are told, until your health care provider says that it is safe. °· Ask your health care provider when it is okay to: °? Return to work or school. °? Resume usual physical activities or sports. °? Resume sexual activity. °General instructions °· If the catheter site starts to bleed, raise your arm and put firm pressure on the site. If the bleeding does not stop, get help right away. This is a medical emergency. °· If you went home on the same day as your procedure, a responsible adult should be with you for the first 24 hours after you arrive home. °· Keep all follow-up visits as told by your health care provider. This is important. °Contact a health care provider if: °· You have a fever. °· You have redness, swelling, or yellow drainage around your insertion site. °Get help right away if: °· You have unusual pain at the radial site. °· The catheter insertion area swells very fast. °· The insertion area is bleeding, and the bleeding does not stop when you hold steady pressure on the area. °· Your arm or hand becomes pale, cool, tingly, or numb. °These symptoms may represent a serious problem   that is an emergency. Do not wait to see if the symptoms will go away. Get medical help right away. Call your local emergency services (911 in the [place] Do not drive yourself to the hospital. Summary  After the procedure, it is common to have bruising and tenderness at the site.  Follow instructions from your health care provider about how to take care of your radial site wound. Check the wound every day for signs of infection.  Do not lift anything that is heavier than 10 lb (4.5 kg), or the limit that you are told, until your health care provider says  that it is safe. This information is not intended to replace advice given to you by your health care provider. Make sure you discuss any questions you have with your health care provider. Document Released: 04/10/2010 Document Revised: 04/13/2017 Document Reviewed: 04/13/2017 Elsevier Patient Education  [place]. Clopidogrel tablets What is this medicine? CLOPIDOGREL (kloh PID oh grel) helps to prevent blood clots. This medicine is used to prevent heart attack, stroke, or other vascular events in people who are at high risk. This medicine may be used for other purposes; ask your health care provider or pharmacist if you have questions. COMMON BRAND NAME(S): Plavix What should I tell my health care provider before I take this medicine? They need to know if you have any of the following conditions:  bleeding disorders  bleeding in the brain  having surgery  history of stomach bleeding  an unusual or allergic reaction to clopidogrel, other medicines, foods, dyes, or preservatives  pregnant or trying to get pregnant  breast-feeding How should I use this medicine? Take this medicine by mouth with a glass of water. Follow the directions on the prescription label. You may take this medicine with or without food. If it upsets your stomach, take it with food. Take your medicine at regular intervals. Do not take it more often than directed. Do not stop taking except on your doctor's advice. A special MedGuide will be given to you by the pharmacist with each prescription and refill. Be sure to read this information carefully each time. Talk to your pediatrician regarding the use of this medicine in children. Special care may be needed. Overdosage: If you think you have taken too much of this medicine contact a poison control center or emergency room at once. NOTE: This medicine is only for you. Do not share this medicine with others. What if I miss a dose? If you miss a dose, take it  as soon as you can. If it is almost time for your next dose, take only that dose. Do not take double or extra doses. What may interact with this medicine? Do not take this medicine with the following medications:  dasabuvir; ombitasvir; paritaprevir; ritonavir  defibrotide  selexipag This medicine may also interact with the following medications:  certain medicines that treat or prevent blood clots like warfarin  narcotic medicines for pain  NSAIDs, medicines for pain and inflammation, like ibuprofen or naproxen  repaglinide  SNRIs, medicines for depression, like desvenlafaxine, duloxetine, levomilnacipran, venlafaxine  SSRIs, medicines for depression, like citalopram, escitalopram, fluoxetine, fluvoxamine, paroxetine, sertraline  stomach acid blockers like cimetidine, esomeprazole, omeprazole This list may not describe all possible interactions. Give your health care provider a list of all the medicines, herbs, non-prescription drugs, or dietary supplements you use. Also tell them if you smoke, drink alcohol, or use illegal drugs. Some items may interact with your medicine. What should I  watch for while using this medicine? Visit your doctor or health care professional for regular check-ups. Do not stop taking your medicine unless your doctor tells you to. Notify your doctor or health care professional and seek emergency treatment if you develop breathing problems; changes in vision; chest pain; severe, sudden headache; pain, swelling, warmth in the leg; trouble speaking; sudden numbness or weakness of the face, arm or leg. These can be signs that your condition has gotten worse. If you are going to have surgery or dental work, tell your doctor or health care professional that you are taking this medicine. Certain genetic factors may reduce the effect of this medicine. Your doctor may use genetic tests to determine treatment. Only take aspirin if you are instructed to. Low doses of  aspirin are used with this medicine to treat some conditions. Taking aspirin with this medicine can increase your risk of bleeding so you must be careful. Talk to your doctor or pharmacist if you have questions. What side effects may I notice from receiving this medicine? Side effects that you should report to your doctor or health care professional as soon as possible:  allergic reactions like skin rash, itching or hives, swelling of the face, lips, or tongue  signs and symptoms of bleeding such as bloody or black, tarry stools; red or dark-brown urine; spitting up blood or brown material that looks like coffee grounds; red spots on the skin; unusual bruising or bleeding from the eye, gums, or nose  signs and symptoms of a blood clot such as breathing problems; changes in vision; chest pain; severe, sudden headache; pain, swelling, warmth in the leg; trouble speaking; sudden numbness or weakness of the face, arm or leg  signs and symptoms of low blood sugar such as feeling anxious; confusion; dizziness; increased hunger; unusually weak or tired; increased sweating; shakiness; cold, clammy skin; irritable; headache; blurred vision; fast heartbeat; loss of consciousness Side effects that usually do not require medical attention (report to your doctor or health care professional if they continue or are bothersome):  constipation  diarrhea  headache  upset stomach This list may not describe all possible side effects. Call your doctor for medical advice about side effects. You may report side effects to FDA at 1-800-FDA-1088. Where should I keep my medicine? Keep out of the reach of children. Store at room temperature of 59 to 86 degrees F (15 to 30 degrees C). Throw away any unused medicine after the expiration date. NOTE: This sheet is a summary. It may not cover all possible information. If you have questions about this medicine, talk to your doctor, pharmacist, or health care provider.   2020 Elsevier/Gold Standard (2017-08-08 15:03:38)

## 2019-01-18 NOTE — Progress Notes (Signed)
C/o CP 5/10 mid chest heaviness, Ria Comment pa for cardiology was called, one dose of nitro ordered sl.

## 2019-01-18 NOTE — Interval H&P Note (Signed)
Cath Lab Visit (complete for each Cath Lab visit)  Clinical Evaluation Leading to the Procedure:   ACS: No.  Non-ACS:    Anginal Classification: CCS III  Anti-ischemic medical therapy: Minimal Therapy (1 class of medications)  Non-Invasive Test Results: No non-invasive testing performed  Prior CABG: No previous CABG  Cardiac catheterization was discussed with the patient fully. The patient understands that risks include but are not limited to stroke (1 in 1000), death (1 in 36), kidney failure [usually temporary] (1 in 500), bleeding (1 in 200), allergic reaction [possibly serious] (1 in 200).  The patient understands and is willing to proceed.        History and Physical Interval Note:  01/18/2019 8:25 AM  Holly Smith  has presented today for surgery, with the diagnosis of chest pain.  The various methods of treatment have been discussed with the patient and family. After consideration of risks, benefits and other options for treatment, the patient has consented to  Procedure(s): LEFT HEART CATH AND CORONARY ANGIOGRAPHY (N/A) as a surgical intervention.  The patient's history has been reviewed, patient examined, no change in status, stable for surgery.  I have reviewed the patient's chart and labs.  Questions were answered to the patient's satisfaction.     Larae Grooms

## 2019-01-18 NOTE — Discharge Summary (Addendum)
Discharge Summary/SAME DAY PCI    Patient ID: Holly Smith,  MRN: [id number], DOB/AGE: 07/28/1945 73 y.o.  Admit date: 01/18/2019 Discharge date: 01/18/2019  Primary Care Provider: Juline Patch Primary Cardiologist: Dr. Irish Lack   Discharge Diagnoses    Principal Problem:   Stable angina [facility]) Active Problems:   Atrial fibrillation, persistent (HCC)   CHF (congestive heart failure) (HCC)   Allergies Allergies  Allergen Reactions  . Ciprofloxacin Other (See Comments)    SOB    Diagnostic Studies/Procedures    Cath: 01/18/19   Prox Cx lesion is 50% stenosed.  More distal mid LAD-1 lesion is 80% stenosed.  A drug-eluting stent was successfully placed using a STENT SYNERGY DES 2.25X28.  Post intervention, there is a 0% residual stenosis.  More proximal mid LAD-2 lesion is 80% stenosed.  A drug-eluting stent was successfully placed using a STENT SYNERGY DES 2.75X20 postdilated to 3.0 mm.  Post intervention, there is a 0% residual stenosis.  Mid RCA lesion is 25% stenosed.  Acute Mrg lesion is 75% stenosed. This is a small vessel.  RPDA lesion is 70% stenosed. This is a small vessel.  The left ventricular systolic function is normal.  LV end diastolic pressure is moderately elevated. LVEDP 26 mm Hg.  The left ventricular ejection fraction is 55-65% by visual estimate.  There is no aortic valve stenosis.   Restart Xarelto tomorrow.  Continue Plavix 75 mg daily in addition to Xarelto.  To reduce bleeding risk, will hold off on continuing aspirin.    She has a fair amount of volume overload as well.  Will need to increase diuretics at least temporarily after she recovers from the cath.  This may help her breathing.   Continue aggressive secondary prevention.   Diagnostic Dominance: Right  Intervention    _____________   History of Present Illness     Holly Smith is a 73 y.o. female with PMH of chronic atrial fibrillation,  HTN, HL, Chronic diastolic HF who presented to the office. Her Afib is now rate controlledand but has a hx tachycardia mediated cardiomyopathy in 2015. She was cardioverted at [facility].EF later improved but she has chronic diastolic dysfunction. Last echo was in 2016. EF was 55-60%. Mild MR noted. She is managed with Xarelto for a/c.She went back into atrial fibrillation several years ago which is now chronic. Previously seen by Dr. Rayann Heman and not felt to be a good candidate for ablation. She has failed propafenoneand amiodarone in the past. She is managed with metoprolol for rate control. No h/o CAD. Other medical problems include HTN, HLD and obesity.  In 2019: "We discussed weight loss surgery referral. She had a friend who died many years ago during weight loss surgery so she is hesitant. She prefers the Atkins diet to try to lose weight. She has done well with this in the past. "  Since the last visit, she had some palpitations.  She had some chest tightness with this episode.  It lasted about 30 minutes.  No recurrence of chest pain.  No CP while walking from her car to the office.  She had to walk up a slight incline and had only some mild SHOB. Discussed options with the patient and she initially wanted to undergo stress testing, but called the office back and requested cath.   Hospital Course     Underwent cardiac cath noted above with PCI/DES x2 to the p/mLAD. Plan for plavix and Xarelto for at least  6 months. Seen by cardiac rehab while in short stay. No complications post cath. Did have brief episode of chest pain which resolved with 1 SL nitro and no recurrence.  Radial site stable . Instructions/precautions regarding cath site care given prior to discharge. Discussed with patient the need for treatment of her hyperlipidemia. She is hesitant to start a statin at this time, concerned she will have myalgias. States she will consider and reddress with MD at her follow up appt.      Holly Smith was seen by Dr. Irish Lack and determined stable for discharge home. Follow up in the office has been arranged. Medications are listed below.   _____________  Discharge Vitals Blood pressure 112/71, pulse 86, temperature (!) 97 F (36.1 C), temperature source Skin, resp. rate (!) 26, height 5' (1.524 m), weight 103.9 kg, SpO2 98 %.  Filed Weights   01/18/19 0709  Weight: 103.9 kg    Labs & Radiologic Studies    CBC No results for input(s): WBC, NEUTROABS, HGB, HCT, MCV, PLT in the last 72 hours. Basic Metabolic Panel Recent Labs    01/18/19 0714  K 4.1   Liver Function Tests No results for input(s): AST, ALT, ALKPHOS, BILITOT, PROT, ALBUMIN in the last 72 hours. No results for input(s): LIPASE, AMYLASE in the last 72 hours. Cardiac Enzymes No results for input(s): CKTOTAL, CKMB, CKMBINDEX, TROPONINI in the last 72 hours. BNP Invalid input(s): POCBNP D-Dimer No results for input(s): DDIMER in the last 72 hours. Hemoglobin A1C No results for input(s): HGBA1C in the last 72 hours. Fasting Lipid Panel No results for input(s): CHOL, HDL, LDLCALC, TRIG, CHOLHDL, LDLDIRECT in the last 72 hours. Thyroid Function Tests No results for input(s): TSH, T4TOTAL, T3FREE, THYROIDAB in the last 72 hours.  Invalid input(s): FREET3 _____________  No results found. Disposition   Pt is being discharged home today in good condition.  Follow-up Plans & Appointments    Follow-up Information    Daune Perch, NP Follow up on 01/25/2019.   Specialty: Cardiology Why: at 9am for your follow up appt.  Contact information: [address] [place] [address] [phone]          Discharge Instructions    Amb Referral to Cardiac Rehabilitation   Complete by: As directed    Diagnosis: Coronary Stents   After initial evaluation and assessments completed: Virtual Based Care may be provided alone or in conjunction with Phase 2 Cardiac Rehab based on  patient barriers.: Yes       Discharge Medications     Medication List    STOP taking these medications   aspirin 81 MG chewable tablet   omeprazole 20 MG capsule Commonly known as: PRILOSEC     TAKE these medications   acetaminophen 325 MG tablet Commonly known as: TYLENOL Take 2 tablets (650 mg total) by mouth every 6 (six) hours as needed for mild pain (or Fever >/= 101).   BIOTIN PO Take 1 tablet by mouth daily.   clobetasol ointment 0.05 % Commonly known as: TEMOVATE Apply 1 application topically as needed (irritaion).   clopidogrel 75 MG tablet Commonly known as: PLAVIX Take 1 tablet (75 mg total) by mouth daily with breakfast. Start taking on: January 19, 2019   furosemide 40 MG tablet Commonly known as: LASIX TAKE 2 TABLETS BY MOUTH EVERY DAY   irbesartan 75 MG tablet Commonly known as: AVAPRO TAKE 1 TABLET BY MOUTH EVERY DAY What changed:   how much  to take  additional instructions   metoprolol tartrate 100 MG tablet Commonly known as: LOPRESSOR Take 1.5 tablets (150 mg total) by mouth 2 (two) times daily.   Nasacort Allergy 24HR 55 MCG/ACT Aero nasal inhaler Generic drug: triamcinolone Place 2 sprays into the nose daily as needed (allergies).   nitroGLYCERIN 0.4 MG SL tablet Commonly known as: Nitrostat Place 1 tablet (0.4 mg total) under the tongue every 5 (five) minutes as needed.   pantoprazole 40 MG tablet Commonly known as: Protonix Take 1 tablet (40 mg total) by mouth daily.   vitamin B-12 1000 MCG tablet Commonly known as: CYANOCOBALAMIN Take 1,000 mcg by mouth every other day.   Xarelto 20 MG Tabs tablet Generic drug: rivaroxaban TAKE 1 TABLET (20 MG TOTAL) BY MOUTH DAILY WITH SUPPER. What changed: See the new instructions. Notes to patient: restart tomorrow 10/30        No                               Did the patient have a percutaneous coronary intervention (stent / angioplasty)?:  Yes.     Cath/PCI Registry  Performance & Quality Measures: 1. Aspirin prescribed? - No - On Xarelto, not good candidate for triple therapy 2. ADP Receptor Inhibitor (Plavix/Clopidogrel, Brilinta/Ticagrelor or Effient/Prasugrel) prescribed (includes medically managed patients)? - Yes 3. High Intensity Statin (Lipitor 40-80mg  or Crestor 20-40mg ) prescribed? - No - patient refused at this time. 4. For EF <40%, was ACEI/ARB prescribed? - Not Applicable (EF >/= [id number]) 5. For EF <40%, Aldosterone Antagonist (Spironolactone or Eplerenone) prescribed? - Not Applicable (EF >/= [id number]) 6. Cardiac Rehab Phase II ordered (Included Medically managed Patients)? - Yes   Outstanding Labs/Studies   N/a  Duration of Discharge Encounter   Greater than 30 minutes including physician time.  Signed, Reino Bellis NP-C 01/18/2019, 3:52 PM   I have examined the patient and reviewed assessment and plan and discussed with patient.  Agree with above as stated.  S/p PCI of the LAD.  Continue aggressive secondary prevention.  She will need diuresis when she comes back to the office.  THis is likely comtributing to her [facility].  Longterm, she will benefit from weight loss.   Larae Grooms

## 2019-01-18 NOTE — Progress Notes (Signed)
CARDIAC REHAB PHASE I   Stent education completed with pt. Pt educated on importance of Xerelto and Plavix. Pt given stent card and heart healthy diet. Reviewed restrictions, site care, and exercise guidelines. Will refer to CRP II [place]. Pt is interested in participating in Virtual Cardiac and Pulmonary Rehab. Pt advised that Virtual Cardiac and Pulmonary Rehab is provided at no cost to the patient.  Checklist:  1. Pt has smart device  ie smartphone and/or ipad for downloading an app  Yes 2. Reliable internet/wifi service    Yes 3. Understands how to use their smartphone and navigate within an app.  Yes  Pt verbalized understanding and is in agreement.   [id number] Rufina Falco, RN BSN 01/18/2019 12:16 PM

## 2019-01-19 ENCOUNTER — Encounter (HOSPITAL_COMMUNITY): Payer: Self-pay | Admitting: Interventional Cardiology

## 2019-01-24 NOTE — Patient Instructions (Addendum)
Medication Instructions:  1.) STOP: Lasix   2.) START: Torsemide- Take 2 tablets (40 mg) by mouth daily, you may take extra as needed in the afternoon.  3.) START: Crestor 20 mg daily  *If you need a refill on your cardiac medications before your next appointment, please call your pharmacy*  Lab Work: FUTURE: Lipids and Lft's in 6 weeks (To be done at [facility])  FUTURE: Bmet in 1 week (to be done at [facility])  If you have labs (blood work) drawn today and your tests are completely normal, you will receive your results only by: Marland Kitchen MyChart Message (if you have MyChart) OR . A paper copy in the mail If you have any lab test that is abnormal or we need to change your treatment, we will call you to review the results.  Testing/Procedures: None   Follow-Up: At [facility], you and your health needs are our priority.  As part of our continuing mission to provide you with exceptional heart care, we have created designated Provider Care Teams.  These Care Teams include your primary Cardiologist (physician) and Advanced Practice Providers (APPs -  Physician Assistants and Nurse Practitioners) who all work together to provide you with the care you need, when you need it.  Your next appointment:   6 months  The format for your next appointment:   Either In Person or Virtual  Provider:   You may see Larae Grooms, MD or one of the following Advanced Practice Providers on your designated Care Team:    Melina Copa, PA-C  Ermalinda Barrios, PA-C   Other Instructions  Lifestyle Modifications to Prevent and Treat Heart Disease -Recommend heart healthy/Mediterranean diet, with whole grains, fruits, vegetables, fish, lean meats, nuts, olive oil and avocado oil.  -Limit salt intake to less than 2000 mg per day.  -Recommend moderate walking, starting slowly with a few minutes and working up to 3-5 times/week for 30-50 minutes each session. Aim for at least 150 minutes.week. Goal should be pace of 3  miles/hours, or walking 1.5 miles in 30 minutes -Recommend avoidance of tobacco products. Avoid excess alcohol. -Keep blood pressure well controlled, ideally less than 130/80.     Mediterranean Diet A Mediterranean diet refers to food and lifestyle choices that are based on the traditions of countries located on the [employer]. This way of eating has been shown to help prevent certain conditions and improve outcomes for people who have chronic diseases, like kidney disease and heart disease. What are tips for following this plan? Lifestyle  Cook and eat meals together with your family, when possible.  Drink enough fluid to keep your urine clear or pale yellow.  Be physically active every day. This includes: ? Aerobic exercise like running or swimming. ? Leisure activities like gardening, walking, or housework.  Get 7-8 hours of sleep each night.  If recommended by your health care provider, drink red wine in moderation. This means 1 glass a day for nonpregnant women and 2 glasses a day for men. A glass of wine equals 5 oz (150 mL). Reading food labels   Check the serving size of packaged foods. For foods such as rice and pasta, the serving size refers to the amount of cooked product, not dry.  Check the total fat in packaged foods. Avoid foods that have saturated fat or trans fats.  Check the ingredients list for added sugars, such as corn syrup. Shopping  At the grocery store, buy most of your food from the areas  near the walls of the store. This includes: ? Fresh fruits and vegetables (produce). ? Grains, beans, nuts, and seeds. Some of these may be available in unpackaged forms or large amounts (in bulk). ? Fresh seafood. ? Poultry and eggs. ? Low-fat dairy products.  Buy whole ingredients instead of prepackaged foods.  Buy fresh fruits and vegetables in-season from local farmers markets.  Buy frozen fruits and vegetables in resealable bags.  If you do not have  access to quality fresh seafood, buy precooked frozen shrimp or canned fish, such as tuna, salmon, or sardines.  Buy small amounts of raw or cooked vegetables, salads, or olives from the deli or salad bar at your store.  Stock your pantry so you always have certain foods on hand, such as olive oil, canned tuna, canned tomatoes, rice, pasta, and beans. Cooking  Cook foods with extra-virgin olive oil instead of using butter or other vegetable oils.  Have meat as a side dish, and have vegetables or grains as your main dish. This means having meat in small portions or adding small amounts of meat to foods like pasta or stew.  Use beans or vegetables instead of meat in common dishes like chili or lasagna.  Experiment with different cooking methods. Try roasting or broiling vegetables instead of steaming or sauteing them.  Add frozen vegetables to soups, stews, pasta, or rice.  Add nuts or seeds for added healthy fat at each meal. You can add these to yogurt, salads, or vegetable dishes.  Marinate fish or vegetables using olive oil, lemon juice, garlic, and fresh herbs. Meal planning   Plan to eat 1 vegetarian meal one day each week. Try to work up to 2 vegetarian meals, if possible.  Eat seafood 2 or more times a week.  Have healthy snacks readily available, such as: ? Vegetable sticks with hummus. ? [place] yogurt. ? Fruit and nut trail mix.  Eat balanced meals throughout the week. This includes: ? Fruit: 2-3 servings a day ? Vegetables: 4-5 servings a day ? Low-fat dairy: 2 servings a day ? Fish, poultry, or lean meat: 1 serving a day ? Beans and legumes: 2 or more servings a week ? Nuts and seeds: 1-2 servings a day ? Whole grains: 6-8 servings a day ? Extra-virgin olive oil: 3-4 servings a day  Limit red meat and sweets to only a few servings a month What are my food choices?  Mediterranean diet ? Recommended  Grains: Whole-grain pasta. Brown rice. Bulgar wheat. Polenta.  Couscous. Whole-wheat bread. [place].  Vegetables: Artichokes. Beets. Broccoli. Cabbage. Carrots. Eggplant. Green beans. Chard. Kale. Spinach. Onions. Leeks. Peas. Squash. Tomatoes. Peppers. Radishes.  Fruits: Apples. Apricots. Avocado. Berries. Bananas. Cherries. Dates. Figs. Grapes. Lemons. Melon. Oranges. Peaches. Plums. Pomegranate.  Meats and other protein foods: Beans. Almonds. Sunflower seeds. Pine nuts. Peanuts. [place]. Salmon. Scallops. Shrimp. [place]. Tilapia. Clams. Oysters. Eggs.  Dairy: Low-fat milk. Cheese. Greek yogurt.  Beverages: Water. Red wine. Herbal tea.  Fats and oils: Extra virgin olive oil. Avocado oil. Grape seed oil.  Sweets and desserts: [place] yogurt with honey. Baked apples. Poached pears. Trail mix.  Seasoning and other foods: Basil. Cilantro. Coriander. Cumin. Mint. Parsley. Sage. Rosemary. Tarragon. Garlic. Oregano. Thyme. Pepper. Balsalmic vinegar. Tahini. Hummus. Tomato sauce. Olives. Mushrooms. ? Limit these  Grains: Prepackaged pasta or rice dishes. Prepackaged cereal with added sugar.  Vegetables: Deep fried potatoes (french fries).  Fruits: Fruit canned in syrup.  Meats and other protein foods: Beef. Pork. Lamb. Poultry  with skin. Hot dogs. [place] Salines.  Dairy: Ice cream. Sour cream. Whole milk.  Beverages: Juice. Sugar-sweetened soft drinks. Beer. Liquor and spirits.  Fats and oils: Butter. Canola oil. Vegetable oil. Beef fat (tallow). Lard.  Sweets and desserts: Cookies. Cakes. Pies. Candy.  Seasoning and other foods: Mayonnaise. Premade sauces and marinades. The items listed may not be a complete list. Talk with your dietitian about what dietary choices are right for you. Summary  The Mediterranean diet includes both food and lifestyle choices.  Eat a variety of fresh fruits and vegetables, beans, nuts, seeds, and whole grains.  Limit the amount of red meat and sweets that you eat.  Talk with your health care provider about whether it  is safe for you to drink red wine in moderation. This means 1 glass a day for nonpregnant women and 2 glasses a day for men. A glass of wine equals 5 oz (150 mL). This information is not intended to replace advice given to you by your health care provider. Make sure you discuss any questions you have with your health care provider. Document Released: 10/30/2015 Document Revised: 11/06/2015 Document Reviewed: 10/30/2015 Elsevier Patient Education  2020 [employer].

## 2019-01-24 NOTE — Progress Notes (Signed)
Cardiology Office Note:    Date:  01/25/2019   ID:  Holly Smith, Stoakes 11/17/1945, MRN [id number]  PCP:  Juline Patch, MD  Cardiologist:  Larae Grooms, MD  Referring MD: Juline Patch, MD   Chief Complaint  Patient presents with   Hospitalization Follow-up   Coronary Artery Disease    S/P stent   Congestive Heart Failure    History of Present Illness:    Holly Smith is a 73 y.o. female with a past medical history significant for chronic atrial fibrillation, hypertension, hyperlipidemia, chronic diastolic heart failure, prior tobacco use.  Her atrial fibrillation is rate controlled but she has a history of tachycardia mediated cardiomyopathy in 2015.  She was cardioverted at [facility].  EF later improved but she has chronic diastolic dysfunction.  Last echocardiogram in 2016 showed EF 55-60% with mild MR.  She is on Xarelto for stroke risk reduction.  Her atrial fibrillation is now considered permanent.  She has been seen by Dr. Rayann Heman and not felt to be a good candidate for ablation.  She has failed propafenone and amiodarone in the past.  Weight loss surgery was discussed in 2019 however the patient had a friend who died during weight loss surgery so she preferred to follow the Atkins diet to try to lose weight.  The patient was seen in the office on 01/08/2019 by Dr. Irish Lack at which time she was noting some chest tightness lasting about 30 minutes.  She also had some mild shortness of breath with walking up an incline.  The patient preferred to go for cardiac cath over stress test.  Patient underwent cardiac catheterization on 01/18/2019 with placement of DES x2 to the proximal and mid LAD by radial approach.  Plan is for Plavix and Xarelto for at least 6 months.  The patient did not want to start a statin due to concerns of myalgias.  Dr. Illene Bolus recommended continued aggressive secondary prevention.  He also noted that the patient would likely need diuresis  when she comes back to the office in setting of her shortness of breath.  Recommendation for weight loss for long-term benefit.  [name] is here today for hospital follow up. She has not been very active since her stents but she feels like her breathing is slightly better.  She feels like she still has some fluid retention after stopping lasix prior to cath. She is now back on lasix and lost 4 pounds.  She feels like her abdomen is bloated.  Taking a shower still wears her out that she feels is related to excess volume. She was able to walk up the hill to our office without gasping like she did prior to her stent.  She states that she limits her salt intake.  She also reports poor appetite and early satiety.  She has trace right ankle edema. No pretibial edema.    Cardiac studies   Cath: 01/18/19   Prox Cx lesion is 50% stenosed.  More distal mid LAD-1 lesion is 80% stenosed.  A drug-eluting stent was successfully placed using a STENT SYNERGY DES 2.25X28.  Post intervention, there is a 0% residual stenosis.  More proximal mid LAD-2 lesion is 80% stenosed.  A drug-eluting stent was successfully placed using a STENT SYNERGY DES 2.75X20 postdilated to 3.0 mm.  Post intervention, there is a 0% residual stenosis.  Mid RCA lesion is 25% stenosed.  Acute Mrg lesion is 75% stenosed. This is a small vessel.  RPDA lesion  is 70% stenosed. This is a small vessel.  The left ventricular systolic function is normal.  LV end diastolic pressure is moderately elevated. LVEDP 26 mm Hg.  The left ventricular ejection fraction is 55-65% by visual estimate.  There is no aortic valve stenosis.  Restart Xarelto tomorrow. Continue Plavix 75 mg daily in addition to Xarelto. To reduce bleeding risk, will hold off on continuing aspirin.   She has a fair amount of volume overload as well. Will need to increase diuretics at least temporarily after she recovers from the cath. This may help her  breathing.   Continue aggressive secondary prevention.   Diagnostic Dominance: Right  Intervention     Past Medical History:  Diagnosis Date   A-fib ([place])    Abnormal Pap smear of vagina and vaginal HPV    Acute diastolic CHF (congestive heart failure) ([place]) 01/21/2015   Acute diverticulitis 01/28/2015   Acute on chronic diastolic heart failure ([place]) 01/21/2015   Anticoagulation adequate 08/02/2014   Overview:  Xarelto 20hs; CHADS=1+ htn, female, age  Overview:  Xarelto 20hs; CHADS=1+ htn, female, age   Arthritis    "bones ache" (01/23/2015)   Atrial fibrillation, persistent ([place]) 08/13/2015   Overview:  symptomatic (fatigue, DOE, decreased exercise tolerance, palpitations), drug refractory (propafenone, amiodarone), complicated recovery from DCCV possibly related to repiratory obstruction [facility] (patient fear of DCCV)  Overview:  symptomatic (fatigue, DOE, decreased exercise tolerance, palpitations), drug refractory (propafenone, amiodarone), complicated recovery from DCCV possibly relat   Benign neoplasm of ascending colon    Benign neoplasm of descending colon    Benign neoplasm of sigmoid colon    Blood in stool 01/21/2015   Breath shortness 08/20/2013   CAFL (chronic airflow limitation) (HCC) 12/16/2014   Cancer (HCC)    vulvar cancer   CCF (congestive cardiac failure) ([place]) 08/03/2013   Overview:  ACUTE SYSTOLIC    Chest pain [id number]   CHF (congestive heart failure) (HCC)    CHF (congestive heart failure) (HCC)    Chronic anticoagulation    She is on Xarelto for Afib   Chronic atrial fibrillation (HCC)    Chronic combined systolic and diastolic CHF, NYHA class 3 ([place]) 01/28/2015   Chronic diastolic heart failure ([place]) 05/05/2015   Condyloma acuminatum of vulva 08/02/2014   COPD (chronic obstructive pulmonary disease) ([place]) 08/13/2015   Diverticulitis large intestine w/o perforation or abscess w/bleeding 01/28/2015   Diverticulitis of large intestine  without perforation or abscess with bleeding    Dysmetabolic syndrome [id number]   Overview:  BG-166 06/15/2013; elevated BMI; central obesity    Dyspepsia    Dyspnea    Echocardiogram abnormal    Mitral regurgitation, tricuspic regurgitation   Essential hypertension 10/07/2014   Trial off acei 10/07/2014  Due to atypical sob (talking = walking)> no real change 11/18/2014     Family history of adverse reaction to anesthesia    sister gets very nauseated and vomits   Gastritis without bleeding    GERD (gastroesophageal reflux disease)    History of kidney stones    History of stress test    Myoview 8/16: EF 42%, anteroseptal, inferoseptal, anterior, apical anterior and apical defect (likely represents breast attenuation versus scar), no ischemia; intermediate risk because of low EF   HPV (human papilloma virus) anogenital infection    03/21/12 PAP + HR HPV   HTN (hypertension) 12/16/2014   Hypertension    Hypertensive heart disease 05/12/2016   Hypertensive urgency 01/21/2015   Insulin resistance  Kidney stone    Kidney stones    Malignant neoplasm of vulva ([place]) 06/19/2014   Overview:  Ms HADEN SHELEY is a 73 yo who was referred by Dr Ouida Sills for a vulvar lesion. 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 The specimen had negative margins, but was within 2 mm of the cancer.     Mitral regurgitation 04/23/2013   Overview:  "Moderate to severe" by TTE, "mild to mod" by TEE  Overview:  "Moderate to severe" by TTE, "mild to mod" by TEE   Obesity    OSA (obstructive sleep apnea) 11/27/2014   07/2013 RDI 17/h CPAP 8    OSA on CPAP    Other specified diseases of esophagus    Postoperative nausea and vomiting 1983   many years ago with hysterectomy   PVC (premature ventricular contraction) 12/16/2014   Sepsis ([place]) 12/13/2016   TI (tricuspid incompetence) 04/23/2013   Overview:  "moderate"  by TTE    Urolithiasis    Vertigo    Vulvar cancer ([place]) 2016   invasive verrucous carcinoma of the vulva. History of genital warts and vulvar condyloma   Vulvar cancer, carcinoma ([place]) 05/10/2014    Past Surgical History:  Procedure Laterality Date   ABDOMINAL HYSTERECTOMY     APPENDECTOMY     BILATERAL SALPINGOOPHORECTOMY  2003   benign ovarian cancer   CARDIOVERSION  2014-2015   " [facility]"   COLONOSCOPY WITH PROPOFOL N/A 06/10/2015   Procedure: COLONOSCOPY WITH PROPOFOL;  Surgeon: Lucilla Lame, MD;  Location: [facility] ENDOSCOPY;  Service: Endoscopy;  Laterality: N/A;   CORONARY STENT INTERVENTION N/A 01/18/2019   Procedure: CORONARY STENT INTERVENTION;  Surgeon: Jettie Booze, MD;  Location: [place] [facility];  Service: Cardiovascular;  Laterality: N/A;   CYSTOSCOPY W/ RETROGRADES Bilateral 10/19/2016   Procedure: CYSTOSCOPY WITH RETROGRADE PYELOGRAM;  Surgeon: Hollice Espy, MD;  Location: [facility] ORS;  Service: Urology;  Laterality: Bilateral;   CYSTOSCOPY W/ URETERAL STENT PLACEMENT Left 12/29/2016   Procedure: CYSTOSCOPY WITH STENT REPLACEMENT;  Surgeon: Hollice Espy, MD;  Location: [facility] ORS;  Service: Urology;  Laterality: Left;   CYSTOSCOPY WITH BIOPSY N/A 10/19/2016   Procedure: CYSTOSCOPY WITH BLADDER BIOPSY, VAGINAL WALL BIOPSY;  Surgeon: Hollice Espy, MD;  Location: [facility] ORS;  Service: Urology;  Laterality: N/A;   CYSTOSCOPY WITH STENT PLACEMENT Left 12/14/2016   Procedure: CYSTOSCOPY WITH STENT PLACEMENT;  Surgeon: Hollice Espy, MD;  Location: [facility] ORS;  Service: Urology;  Laterality: Left;   ESOPHAGOGASTRODUODENOSCOPY (EGD) WITH PROPOFOL N/A 10/05/2016   Surgeon: Lucilla Lame, MD;  Location: [facility] ENDOSCOPY;  Results: Barrett's Esophagus- repeat in 3 years 09/2019   LEFT HEART CATH AND CORONARY ANGIOGRAPHY N/A 01/18/2019   Procedure: LEFT HEART CATH AND CORONARY ANGIOGRAPHY;  Surgeon: Jettie Booze, MD;  Location: [place] [facility];   Service: Cardiovascular;  Laterality: N/A;   LITHOTRIPSY     STONE EXTRACTION WITH BASKET Left 12/29/2016   Procedure: STONE EXTRACTION WITH BASKET;  Surgeon: Hollice Espy, MD;  Location: [facility] ORS;  Service: Urology;  Laterality: Left;   TONSILLECTOMY     URETEROSCOPY Left 12/29/2016   Procedure: URETEROSCOPY;  Surgeon: Hollice Espy, MD;  Location: [facility] ORS;  Service: Urology;  Laterality: Left;   VULVECTOMY Right 05/10/2014   Excisional biopsy of the superior right labial majora mass; [place] PARTIAL  07/02/2014   Re-excision and sentinel node dissection at [facility]  Current Medications: Current Meds  Medication Sig   acetaminophen (TYLENOL) 325 MG tablet Take 2 tablets (650 mg total) by mouth every 6 (six) hours as needed for mild pain (or Fever >/= 101).   BIOTIN PO Take 1 tablet by mouth daily.    clobetasol ointment (TEMOVATE) [id number] % Apply 1 application topically as needed (irritaion).   clopidogrel (PLAVIX) 75 MG tablet Take 1 tablet (75 mg total) by mouth daily with breakfast.   irbesartan (AVAPRO) 75 MG tablet Take 37.5 mg by mouth daily.   metoprolol tartrate (LOPRESSOR) 100 MG tablet Take 1.5 tablets (150 mg total) by mouth 2 (two) times daily.   nitroGLYCERIN (NITROSTAT) 0.4 MG SL tablet Place 1 tablet (0.4 mg total) under the tongue every 5 (five) minutes as needed.   pantoprazole (PROTONIX) 40 MG tablet Take 1 tablet (40 mg total) by mouth daily.   triamcinolone (NASACORT ALLERGY 24HR) 55 MCG/ACT AERO nasal inhaler Place 2 sprays into the nose daily as needed (allergies).    vitamin B-12 (CYANOCOBALAMIN) 1000 MCG tablet Take 1,000 mcg by mouth every other day.    XARELTO 20 MG TABS tablet TAKE 1 TABLET (20 MG TOTAL) BY MOUTH DAILY WITH SUPPER.   [DISCONTINUED] furosemide (LASIX) 40 MG tablet TAKE 2 TABLETS BY MOUTH EVERY DAY   Current Facility-Administered Medications for the 01/25/19 encounter (Office Visit) with Daune Perch, NP  Medication   albuterol (PROVENTIL) (2.5 MG/3ML) 0.083% nebulizer solution 2.5 mg     Allergies:   Ciprofloxacin   Social History   Socioeconomic History   Marital status: Divorced    Spouse name: Not on file   Number of children: 1   Years of education: GED   Highest education level: 12th grade  Occupational History   Occupation: Retired  Scientist, product/process development strain: Not hard at [employer] insecurity    Worry: Never true    Inability: Never true   Transportation needs    Medical: No    Non-medical: No  Tobacco Use   Smoking status: Former Smoker    Packs/day: 1.00    Years: 50.00    Pack years: 50.00    Types: Cigarettes    Quit date: 05/20/2012    Years since quitting: 6.6   Smokeless tobacco: Never Used   Tobacco comment: smoking cessation materials not required  Substance and Sexual Activity   Alcohol use: No    Alcohol/week: 0.0 standard drinks   Drug use: No   Sexual activity: Not Currently  Lifestyle   Physical activity    Days per week: 2 days    Minutes per session: 60 min   Stress: Not at all  Relationships   Social connections    Talks on phone: More than three times a week    Gets together: Once a week    Attends religious service: 1 to 4 times per year    Active member of club or organization: No    Attends meetings of clubs or organizations: Never    Relationship status: Divorced  Other Topics Concern   Not on file  Social History Narrative   Not on file     Family History: The patient's family history includes Diabetes in her mother; Heart attack in her brother and father; Heart disease in her sister; Hypertension in her mother; Pancreatic cancer in her brother; Prostate cancer (age of onset: 78) in her brother; Stroke in her mother. There is no history of Breast cancer,  Kidney cancer, or Bladder Cancer. ROS:   Please see the history of present illness.     All other systems reviewed and are  negative.   EKG:  EKG is not ordered today.    Recent Labs: 01/15/2019: BUN 21; Creatinine, Ser 1.12; Hemoglobin 13.7; Platelets 247; Sodium 138 01/18/2019: Potassium 4.1   Recent Lipid Panel    Component Value Date/Time   CHOL 171 04/24/2018 1215   CHOL 165 03/18/2016 1056   TRIG 119 04/24/2018 1215   HDL 34 (L) 04/24/2018 1215   HDL 42 03/18/2016 1056   CHOLHDL 5.0 04/24/2018 1215   VLDL 24 04/24/2018 1215   LDLCALC 113 (H) 04/24/2018 1215   LDLCALC 89 03/18/2016 1056    Physical Exam:    VS:  BP 126/70    Pulse 86    Ht 5' (1.524 m)    Wt 230 lb 12.8 oz (104.7 kg)    SpO2 96%    BMI 45.08 kg/m     Wt Readings from Last 6 Encounters:  01/25/19 230 lb 12.8 oz (104.7 kg)  01/18/19 229 lb (103.9 kg)  01/08/19 232 lb 12.8 oz (105.6 kg)  12/20/18 229 lb 7 oz (104.1 kg)  05/31/18 226 lb 8 oz (102.7 kg)  05/08/18 225 lb 3.2 oz (102.2 kg)     Physical Exam  Constitutional: She is oriented to person, place, and time. She appears well-developed and well-nourished.  Obese female  HENT:  Head: Normocephalic and atraumatic.  Neck: Normal range of motion. Neck supple. No JVD present.  Cardiovascular: Normal rate, normal heart sounds and intact distal pulses. An irregularly irregular rhythm present. Exam reveals no gallop and no friction rub.  No murmur heard. Pulmonary/Chest: Effort normal and breath sounds normal. No respiratory distress. She has no wheezes. She has no rales.  Abdominal: Soft. Bowel sounds are normal.  Musculoskeletal: Normal range of motion.     Comments: Trace right ankle edema.  No pretibial edema  Neurological: She is alert and oriented to person, place, and time.  Skin: Skin is warm and dry.  Psychiatric: She has a normal mood and affect. Her behavior is normal. Judgment and thought content normal.  Vitals reviewed.    ASSESSMENT:    1. S/P coronary artery stent placement   2. Permanent atrial fibrillation ([place])   3. Chronic diastolic heart  failure ([place])   4. Essential (primary) hypertension   5. Hyperlipidemia, unspecified hyperlipidemia type   6. Class 3 severe obesity due to excess calories with serious comorbidity and body mass index (BMI) of 45.0 to 49.9 in adult [facility])    PLAN:    In order of problems listed above:  CAD status post LAD stent x2 01/18/2019 -Patient has been started on Plavix and continuation of Xarelto, dual therapy for at least 6 months planned. -Patient never had chest pain.  Her main complaint was shortness of breath which is mildly improved.  She has not gotten back to full activity since her cath. -Pt appears stable. Advised to increase activity slowly. Heart heathy diet and work on weight loss.   Chronic atrial fibrillation -Rate controlled on metoprolol, recently changed from metoprolol succinate to metoprolol tartrate. -Continues on Xarelto for stroke risk reduction  Chronic diastolic heart failure -EF 55-60% by echo in 2016 -On Lasix 80 mg daily, extra 40 mg as needed.  -Wt down 4 pounds since cath -Patient still has some mild shortness of breath, improved since cath but not back to baseline.  She  has no significant peripheral edema but feels fullness in her abdomen and has poor appetite and early satiety.  I feel that she may have volume overload demonstrated in her abdomen so I will switch her from Lasix to torsemide for better gastric absorption.  I reviewed with her to take torsemide 40 mg daily and can occasionally take an extra 40 mg in the afternoon if needed.  She will continue to take potassium. -I will check a metabolic panel next week for renal function and potassium.  Hypertension -On irbesartan 75 mg daily, metoprolol tartrate 150 mg twice daily -BP well controlled  Hyperlipidemia -LDL was 113 in 04/2018.  Patient now with known CAD.  Recommend LDL goal of <70.  -Patient is reluctant to take a statin due to hearing friends talk about side effects.  I did explain to her the  benefits of statin for prevention of future CAD events.  She reluctantly consents to try a statin.  She will call us if she has an intolerance and we can switch to a different one.  If she continues to be intolerant we would refer her to the lipid clinic for consideration of a novel agent. Will recheck lipid panel in 6 weeks.  Obesity -Body mass index is 45.08 kg/m. -Patient has declined referral for weight loss surgery in the past.  She has recently gained weight during the pandemic. -Pt reports poor appetite.  We discussed better food choices.  Hopefully diuresis with torsemide will help reduce her abdominal bloating and improve her appetite.  Medication Adjustments/Labs and Tests Ordered: Current medicines are reviewed at length with the patient today.  Concerns regarding medicines are outlined above. Labs and tests ordered and medication changes are outlined in the patient instructions below:  Patient Instructions  Medication Instructions:  1.) STOP: Lasix   2.) START: Torsemide- Take 2 tablets (40 mg) by mouth daily, you may take extra as needed in the afternoon.  3.) START: Crestor 20 mg daily  *If you need a refill on your cardiac medications before your next appointment, please call your pharmacy*  Lab Work: FUTURE: Lipids and Lft's in 6 weeks (To be done at [facility])  FUTURE: Bmet in 1 week (to be done at [facility])  If you have labs (blood work) drawn today and your tests are completely normal, you will receive your results only by:  [place] (if you have MyChart) OR  A paper copy in the mail If you have any lab test that is abnormal or we need to change your treatment, we will call you to review the results.  Testing/Procedures: None   Follow-Up: At [facility], you and your health needs are our priority.  As part of our continuing mission to provide you with exceptional heart care, we have created designated Provider Care Teams.  These Care Teams include your primary  Cardiologist (physician) and Advanced Practice Providers (APPs -  Physician Assistants and Nurse Practitioners) who all work together to provide you with the care you need, when you need it.  Your next appointment:   6 months  The format for your next appointment:   Either In Person or Virtual  Provider:   You may see Larae Grooms, MD or one of the following Advanced Practice Providers on your designated Care Team:    Melina Copa, PA-C  Ermalinda Barrios, PA-C   Other Instructions  Lifestyle Modifications to Prevent and Treat Heart Disease -Recommend heart healthy/Mediterranean diet, with whole grains, fruits, vegetables, fish, lean meats,  nuts, olive oil and avocado oil.  -Limit salt intake to less than 2000 mg per day.  -Recommend moderate walking, starting slowly with a few minutes and working up to 3-5 times/week for 30-50 minutes each session. Aim for at least 150 minutes.week. Goal should be pace of 3 miles/hours, or walking 1.5 miles in 30 minutes -Recommend avoidance of tobacco products. Avoid excess alcohol. -Keep blood pressure well controlled, ideally less than 130/80.     Mediterranean Diet A Mediterranean diet refers to food and lifestyle choices that are based on the traditions of countries located on the [employer]. This way of eating has been shown to help prevent certain conditions and improve outcomes for people who have chronic diseases, like kidney disease and heart disease. What are tips for following this plan? Lifestyle  Cook and eat meals together with your family, when possible.  Drink enough fluid to keep your urine clear or pale yellow.  Be physically active every day. This includes: ? Aerobic exercise like running or swimming. ? Leisure activities like gardening, walking, or housework.  Get 7-8 hours of sleep each night.  If recommended by your health care provider, drink red wine in moderation. This means 1 glass a day for nonpregnant  women and 2 glasses a day for men. A glass of wine equals 5 oz (150 mL). Reading food labels   Check the serving size of packaged foods. For foods such as rice and pasta, the serving size refers to the amount of cooked product, not dry.  Check the total fat in packaged foods. Avoid foods that have saturated fat or trans fats.  Check the ingredients list for added sugars, such as corn syrup. Shopping  At the grocery store, buy most of your food from the areas near the walls of the store. This includes: ? Fresh fruits and vegetables (produce). ? Grains, beans, nuts, and seeds. Some of these may be available in unpackaged forms or large amounts (in bulk). ? Fresh seafood. ? Poultry and eggs. ? Low-fat dairy products.  Buy whole ingredients instead of prepackaged foods.  Buy fresh fruits and vegetables in-season from local farmers markets.  Buy frozen fruits and vegetables in resealable bags.  If you do not have access to quality fresh seafood, buy precooked frozen shrimp or canned fish, such as tuna, salmon, or sardines.  Buy small amounts of raw or cooked vegetables, salads, or olives from the deli or salad bar at your store.  Stock your pantry so you always have certain foods on hand, such as olive oil, canned tuna, canned tomatoes, rice, pasta, and beans. Cooking  Cook foods with extra-virgin olive oil instead of using butter or other vegetable oils.  Have meat as a side dish, and have vegetables or grains as your main dish. This means having meat in small portions or adding small amounts of meat to foods like pasta or stew.  Use beans or vegetables instead of meat in common dishes like chili or lasagna.  Experiment with different cooking methods. Try roasting or broiling vegetables instead of steaming or sauteing them.  Add frozen vegetables to soups, stews, pasta, or rice.  Add nuts or seeds for added healthy fat at each meal. You can add these to yogurt, salads, or  vegetable dishes.  Marinate fish or vegetables using olive oil, lemon juice, garlic, and fresh herbs. Meal planning   Plan to eat 1 vegetarian meal one day each week. Try to work up to 2 vegetarian meals, if  possible.  Eat seafood 2 or more times a week.  Have healthy snacks readily available, such as: ? Vegetable sticks with hummus. ? [place] yogurt. ? Fruit and nut trail mix.  Eat balanced meals throughout the week. This includes: ? Fruit: 2-3 servings a day ? Vegetables: 4-5 servings a day ? Low-fat dairy: 2 servings a day ? Fish, poultry, or lean meat: 1 serving a day ? Beans and legumes: 2 or more servings a week ? Nuts and seeds: 1-2 servings a day ? Whole grains: 6-8 servings a day ? Extra-virgin olive oil: 3-4 servings a day  Limit red meat and sweets to only a few servings a month What are my food choices?  Mediterranean diet ? Recommended  Grains: Whole-grain pasta. Brown rice. Bulgar wheat. Polenta. Couscous. Whole-wheat bread. [place].  Vegetables: Artichokes. Beets. Broccoli. Cabbage. Carrots. Eggplant. Green beans. Chard. Kale. Spinach. Onions. Leeks. Peas. Squash. Tomatoes. Peppers. Radishes.  Fruits: Apples. Apricots. Avocado. Berries. Bananas. Cherries. Dates. Figs. Grapes. Lemons. Melon. Oranges. Peaches. Plums. Pomegranate.  Meats and other protein foods: Beans. Almonds. Sunflower seeds. Pine nuts. Peanuts. [place]. Salmon. Scallops. Shrimp. [place]. Tilapia. Clams. Oysters. Eggs.  Dairy: Low-fat milk. Cheese. Greek yogurt.  Beverages: Water. Red wine. Herbal tea.  Fats and oils: Extra virgin olive oil. Avocado oil. Grape seed oil.  Sweets and desserts: [place] yogurt with honey. Baked apples. Poached pears. Trail mix.  Seasoning and other foods: Basil. Cilantro. Coriander. Cumin. Mint. Parsley. Sage. Rosemary. Tarragon. Garlic. Oregano. Thyme. Pepper. Balsalmic vinegar. Tahini. Hummus. Tomato sauce. Olives. Mushrooms. ? Limit these  Grains:  Prepackaged pasta or rice dishes. Prepackaged cereal with added sugar.  Vegetables: Deep fried potatoes (french fries).  Fruits: Fruit canned in syrup.  Meats and other protein foods: Beef. Pork. Lamb. Poultry with skin. Hot dogs. Berniece Salines.  Dairy: Ice cream. Sour cream. Whole milk.  Beverages: Juice. Sugar-sweetened soft drinks. Beer. Liquor and spirits.  Fats and oils: Butter. Canola oil. Vegetable oil. Beef fat (tallow). Lard.  Sweets and desserts: Cookies. Cakes. Pies. Candy.  Seasoning and other foods: Mayonnaise. Premade sauces and marinades. The items listed may not be a complete list. Talk with your dietitian about what dietary choices are right for you. Summary  The Mediterranean diet includes both food and lifestyle choices.  Eat a variety of fresh fruits and vegetables, beans, nuts, seeds, and whole grains.  Limit the amount of red meat and sweets that you eat.  Talk with your health care provider about whether it is safe for you to drink red wine in moderation. This means 1 glass a day for nonpregnant women and 2 glasses a day for men. A glass of wine equals 5 oz (150 mL). This information is not intended to replace advice given to you by your health care provider. Make sure you discuss any questions you have with your health care provider. Document Released: 10/30/2015 Document Revised: 11/06/2015 Document Reviewed: 10/30/2015 Elsevier Patient Education  2020 Semmes, Daune Perch, NP  01/25/2019 2:29 PM    [place]

## 2019-01-25 ENCOUNTER — Encounter: Payer: Self-pay | Admitting: Cardiology

## 2019-01-25 ENCOUNTER — Ambulatory Visit: Payer: Medicare HMO | Admitting: Cardiology

## 2019-01-25 ENCOUNTER — Encounter (INDEPENDENT_AMBULATORY_CARE_PROVIDER_SITE_OTHER): Payer: Self-pay

## 2019-01-25 ENCOUNTER — Other Ambulatory Visit: Payer: Self-pay

## 2019-01-25 VITALS — BP 126/70 | HR 86 | Ht 60.0 in | Wt 230.8 lb

## 2019-01-25 DIAGNOSIS — I10 Essential (primary) hypertension: Secondary | ICD-10-CM

## 2019-01-25 DIAGNOSIS — I5032 Chronic diastolic (congestive) heart failure: Secondary | ICD-10-CM

## 2019-01-25 DIAGNOSIS — Z6841 Body Mass Index (BMI) 40.0 and over, adult: Secondary | ICD-10-CM | POA: Diagnosis not present

## 2019-01-25 DIAGNOSIS — E785 Hyperlipidemia, unspecified: Secondary | ICD-10-CM

## 2019-01-25 DIAGNOSIS — Z955 Presence of coronary angioplasty implant and graft: Secondary | ICD-10-CM

## 2019-01-25 DIAGNOSIS — I4821 Permanent atrial fibrillation: Secondary | ICD-10-CM

## 2019-01-25 MED ORDER — TORSEMIDE 20 MG PO TABS: 20.0000 mg | ORAL_TABLET | Freq: Every day | ORAL | 3 refills | Status: DC

## 2019-01-25 MED ORDER — ROSUVASTATIN CALCIUM 20 MG PO TABS: 20.0000 mg | ORAL_TABLET | Freq: Every day | ORAL | 3 refills | Status: DC

## 2019-01-25 MED ORDER — TORSEMIDE 20 MG PO TABS: 40.0000 mg | ORAL_TABLET | Freq: Every day | ORAL | 3 refills | Status: DC

## 2019-01-30 ENCOUNTER — Telehealth: Payer: Self-pay | Admitting: Interventional Cardiology

## 2019-01-30 NOTE — Telephone Encounter (Signed)
I spoke with the patient's pharmacy, they received two prescriptions on 11/05, one for torsemide 1 tablet (20 mg) once daily and a second prescription for torsemide 2 tablets (40 mg) once daily and one tablet (20mg ) in the afternoon as needed. The pharmacy confirmed that the 20mg  once daily prescription will be cancelled.   Spoke with patient who is aware that she should be taking torsemide 2 tablets (40 mg) once daily and one tablet (20mg ) in the afternoon as needed and confirms she has been following those directions.

## 2019-01-30 NOTE — Telephone Encounter (Signed)
  Patient is calling because at her visit with Maryla Morrow on 01/25/19 she was told to take torsemide (DEMADEX) 20 MG tablet, Take 2 tablets (40 mg total) by mouth daily. Take 2 tablets by mouth once a day, you may take an extra tablet as needed in the afternoon. When she picked up her prescription her bottle states for her to take 1 tablet daily. She states that on the last page of her paperwork that was given to her when she left her visit it states take Torsemide 1 pill, 1 x daily. She would like to know exactly what she should be taking and if it is the 2 tablets daily then her prescription needs to be fixed with her pharmacy so that she can get the right amount of medication.

## 2019-01-30 NOTE — Telephone Encounter (Signed)
  Note accidentally sent to NL triage. Please see previous note

## 2019-02-05 ENCOUNTER — Other Ambulatory Visit
Admission: RE | Admit: 2019-02-05 | Discharge: 2019-02-05 | Disposition: A | Discharge status: Home / Self Care | Payer: Medicare HMO | Source: Ambulatory Visit | Attending: Cardiology | Admitting: Cardiology

## 2019-02-05 DIAGNOSIS — I10 Essential (primary) hypertension: Secondary | ICD-10-CM | POA: Diagnosis not present

## 2019-02-05 DIAGNOSIS — E785 Hyperlipidemia, unspecified: Secondary | ICD-10-CM

## 2019-02-05 LAB — HEPATIC FUNCTION PANEL
ALT: 18 U/L (ref 0–44)
AST: 25 U/L (ref 15–41)
Albumin: 3.8 g/dL (ref 3.5–5.0)
Alkaline Phosphatase: 46 U/L (ref 38–126)
Bilirubin, Direct: 0.1 mg/dL (ref 0.0–0.2)
Indirect Bilirubin: 0.8 mg/dL (ref 0.3–0.9)
Total Bilirubin: 0.9 mg/dL (ref 0.3–1.2)
Total Protein: 7.7 g/dL (ref 6.5–8.1)

## 2019-02-05 LAB — BASIC METABOLIC PANEL
Anion gap: 14 (ref 5–15)
BUN: 15 mg/dL (ref 8–23)
CO2: 30 mmol/L (ref 22–32)
Calcium: 9 mg/dL (ref 8.9–10.3)
Chloride: 97 mmol/L — ABNORMAL LOW (ref 98–111)
Creatinine, Ser: 1.15 mg/dL — ABNORMAL HIGH (ref 0.44–1.00)
GFR calc Af Amer: 54 mL/min — ABNORMAL LOW (ref >60)
GFR calc non Af Amer: 47 mL/min — ABNORMAL LOW (ref >60)
Glucose, Bld: 178 mg/dL — ABNORMAL HIGH (ref 70–99)
Potassium: 3.6 mmol/L (ref 3.5–5.1)
Sodium: 141 mmol/L (ref 135–145)

## 2019-02-05 LAB — LIPID PANEL
Cholesterol: 109 mg/dL (ref 0–200)
HDL: 37 mg/dL — ABNORMAL LOW (ref >40)
LDL Cholesterol: 43 mg/dL (ref 0–99)
Total CHOL/HDL Ratio: 2.9 RATIO
Triglycerides: 146 mg/dL (ref <150)
VLDL: 29 mg/dL (ref 0–40)

## 2019-02-05 NOTE — Addendum Note (Signed)
Addended by: Santiago Bur on: 02/05/2019 12:55 PM   Modules accepted: Orders

## 2019-02-11 ENCOUNTER — Other Ambulatory Visit: Payer: Self-pay | Admitting: Cardiology

## 2019-02-19 ENCOUNTER — Other Ambulatory Visit: Payer: Self-pay

## 2019-02-19 MED ORDER — CLOPIDOGREL BISULFATE 75 MG PO TABS: 75.0000 mg | ORAL_TABLET | Freq: Every day | ORAL | 10 refills | Status: DC

## 2019-02-21 ENCOUNTER — Telehealth: Payer: Self-pay | Admitting: Interventional Cardiology

## 2019-02-21 NOTE — Telephone Encounter (Signed)
Called and spoke to patient. Explained rationale for being on both Xarelto and Plavix. Made patient aware that last CBC normal. Instructed patient to let us know if she has any S/Sx of bleeding.

## 2019-02-21 NOTE — Telephone Encounter (Signed)
Pt c/o medication issue:  1. Name of Medication: clopidogrel (PLAVIX) 75 MG tablet XARELTO 20 MG TABS tablet  2. How are you currently taking this medication (dosage and times per day)? 1 tablet by mouth daily   3. Are you having a reaction (difficulty breathing--STAT)? No  4. What is your medication issue? Patient is calling stating Pharmacist is concerned with patient taking both medications at the same time stating it could cause her to bleed out. Patient states she's okay with taking both medications, but told pharmacy she would ask about it. Please advise.

## 2019-03-06 ENCOUNTER — Other Ambulatory Visit: Payer: Self-pay | Admitting: Gastroenterology

## 2019-03-14 NOTE — Telephone Encounter (Signed)
error 

## 2019-03-24 ENCOUNTER — Other Ambulatory Visit: Payer: Self-pay | Admitting: Interventional Cardiology

## 2019-06-13 ENCOUNTER — Inpatient Hospital Stay: Payer: Medicare HMO | Attending: Obstetrics and Gynecology | Admitting: Obstetrics and Gynecology

## 2019-06-13 ENCOUNTER — Other Ambulatory Visit: Payer: Self-pay

## 2019-06-13 VITALS — BP 112/78 | HR 92 | Temp 97.3°F | Resp 18 | Wt 217.4 lb

## 2019-06-13 DIAGNOSIS — N843 Polyp of vulva: Secondary | ICD-10-CM | POA: Diagnosis not present

## 2019-06-13 DIAGNOSIS — Z8544 Personal history of malignant neoplasm of other female genital organs: Secondary | ICD-10-CM

## 2019-06-13 DIAGNOSIS — Z90722 Acquired absence of ovaries, bilateral: Secondary | ICD-10-CM | POA: Insufficient documentation

## 2019-06-13 DIAGNOSIS — L439 Lichen planus, unspecified: Secondary | ICD-10-CM

## 2019-06-13 DIAGNOSIS — Z8042 Family history of malignant neoplasm of prostate: Secondary | ICD-10-CM | POA: Insufficient documentation

## 2019-06-13 DIAGNOSIS — Z7901 Long term (current) use of anticoagulants: Secondary | ICD-10-CM | POA: Insufficient documentation

## 2019-06-13 DIAGNOSIS — Z833 Family history of diabetes mellitus: Secondary | ICD-10-CM | POA: Diagnosis not present

## 2019-06-13 DIAGNOSIS — I5042 Chronic combined systolic (congestive) and diastolic (congestive) heart failure: Secondary | ICD-10-CM | POA: Diagnosis not present

## 2019-06-13 DIAGNOSIS — I110 Hypertensive heart disease with heart failure: Secondary | ICD-10-CM | POA: Diagnosis not present

## 2019-06-13 DIAGNOSIS — G4733 Obstructive sleep apnea (adult) (pediatric): Secondary | ICD-10-CM | POA: Diagnosis not present

## 2019-06-13 DIAGNOSIS — C519 Malignant neoplasm of vulva, unspecified: Secondary | ICD-10-CM | POA: Diagnosis not present

## 2019-06-13 DIAGNOSIS — E785 Hyperlipidemia, unspecified: Secondary | ICD-10-CM | POA: Diagnosis not present

## 2019-06-13 DIAGNOSIS — J449 Chronic obstructive pulmonary disease, unspecified: Secondary | ICD-10-CM | POA: Diagnosis not present

## 2019-06-13 DIAGNOSIS — Z8249 Family history of ischemic heart disease and other diseases of the circulatory system: Secondary | ICD-10-CM | POA: Insufficient documentation

## 2019-06-13 DIAGNOSIS — I4891 Unspecified atrial fibrillation: Secondary | ICD-10-CM | POA: Diagnosis not present

## 2019-06-13 DIAGNOSIS — Z87891 Personal history of nicotine dependence: Secondary | ICD-10-CM | POA: Diagnosis not present

## 2019-06-13 DIAGNOSIS — Z79899 Other long term (current) drug therapy: Secondary | ICD-10-CM | POA: Diagnosis not present

## 2019-06-13 DIAGNOSIS — Z90710 Acquired absence of both cervix and uterus: Secondary | ICD-10-CM | POA: Insufficient documentation

## 2019-06-13 DIAGNOSIS — Z800 Family history of malignant neoplasm of digestive organs: Secondary | ICD-10-CM | POA: Insufficient documentation

## 2019-06-13 DIAGNOSIS — Z8543 Personal history of malignant neoplasm of ovary: Secondary | ICD-10-CM | POA: Diagnosis not present

## 2019-06-13 NOTE — Progress Notes (Signed)
Gynecologic Oncology Interval Note  Referring Provider: Dr. Laverta Baltimore  Chief Concern: Vulvar cancer surveillance.   Subjective:  Holly Smith is a 74 y.o. 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   Last saw Dr. Theora Gianotti 12/20/2018 with negative exam. She continues to use clobetasol for vaginal itching with history of vulvar lichen planus as needed. Tolerating well without significant side effects.   Oncology Treatment History:  Holly Smith is a pleasant woman with history of invasive vulvar cancer. Patient had warty lesion on the upper right labia for about two years.    Hysterectomy in the past for benign disease.  Then BSO in 2003 for benign ovarian tumor.  03/21/12 PAP + HR HPV vaginal bx 2 o'clock negative, 9 o'clock HPV effect  Vulvar itching and pain.  07/18/12 - vulvar bx showed condyloma  [id number] - vulvar bx - lichen sclerosis  [id number] - Exophytic mass on right anterior vulva. 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  Negative margins, but within 67mm of cancer.  No LVSI.    We discussed the rationale for re-excision of the scar to insure that there is no residual cancer present in view of the close 2 mm margin and sentinel lymph node mapping and biopsy because the tumor has > 33mm invasion.  Had partial right modified radical vulvectomy on 07/02/14 at [facility] for invasive verrucous carcinoma of the vulva. The final pathology revealed: negative lymph nodes, no evidence of residual disease.  She was referred to Dr Erlene Quan in Urology because of abnormal appearing urethral meatus.  Had cysto and only squamous metaplasia found.  10/19/2016- Biopsy from vagina showed Lichen Sclerosis.  She had left ureteral stone removed with ureteroscope and got septic and was hospitalized.   Still has urinary leakage for which she uses Vesicare.   PAP 3/16 normal.  No gyn complaints.   08/24/17: Biopsy was performed by Dr. Theora Gianotti and showed Lichen Planus.    DIAGNOSIS:  A. VULVA, RIGHT [id number]; BIOPSY:  - LICHEN PLANUS-LIKE PATTERN OF INFLAMMATION.  - PAS STAIN IS NEGATIVE; STAIN CONTROL WORKED APPROPRIATELY.  - NEGATIVE FOR MALIGNANCY.   Comment:  Mild cytologic atypia is present and is interpreted as reactive. Re-examination after treatment is advised.   She started nightly clobetasol propionate 0.05% to affected area.       Problem List: Patient Active Problem List   Diagnosis Date Noted  . Stable angina (HCC)   . Lichen planus [id number]  . Vertigo 03/11/2017  . Sepsis ([place]) 12/13/2016  . Dyspepsia   . Gastritis without bleeding   . Other specified diseases of esophagus   . History of alcohol use 09/07/2016  . Hypertensive heart disease 05/12/2016  . Atrial fibrillation, persistent ([place]) 08/13/2015  . COPD (chronic obstructive pulmonary disease) ([place]) 08/13/2015  . Personal history of other specified conditions 08/13/2015  . Insulin resistance 08/13/2015  . Obesity, unspecified 08/13/2015  . Special screening for malignant neoplasms, colon   . Benign neoplasm of ascending colon   . Benign neoplasm of descending colon   . Benign neoplasm of sigmoid colon   . Chronic diastolic heart failure ([place]) 05/05/2015  . Diverticulitis 01/31/2015  . Diverticulitis large intestine w/o perforation or abscess w/bleeding 01/28/2015  . Chronic combined systolic and diastolic CHF, NYHA class 3 ([place]) 01/28/2015  . Acute diverticulitis 01/28/2015  .  Diverticulitis of large intestine without perforation or abscess with bleeding   . Acute on chronic diastolic heart failure ([place]) 01/21/2015  . Hypertensive urgency 01/21/2015  . Morbid obesity ([place]) 01/21/2015  . Blood in stool 01/21/2015  . Acute diastolic CHF (congestive heart failure) ([place]) 01/21/2015  . CAFL (chronic  airflow limitation) ([place]) 12/16/2014  . Personal history of perinatal problems 12/16/2014  . Hyperlipidemia, unspecified 12/16/2014  . Dysmetabolic syndrome [id number]  . Kidney stones 12/16/2014  . PVC (premature ventricular contraction) 12/16/2014  . Snoring 12/16/2014  . Stress incontinence 12/16/2014  . Fast heart beat 12/16/2014  . Calculus (=stone) 12/16/2014  . OSA (obstructive sleep apnea) 11/27/2014  . Dyspnea 10/07/2014  . Essential hypertension 10/07/2014  . Obesity 10/07/2014  . Genital warts 08/02/2014  . Condyloma acuminatum of vulva 08/02/2014  . Condylomata lata of vulva 08/02/2014  . Anticoagulation adequate 08/02/2014  . Chronic atrial fibrillation ([place]) 08/02/2014  . Current tobacco use 08/02/2014  . 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 Malignant neoplasm of vulva ([place]) 06/19/2014  . Vulvar cancer ([place]) 05/10/2014  . Vulvar cancer, carcinoma ([place]) 05/10/2014  . 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 Chest pain 08/20/2013  . Breath shortness 08/20/2013  . CCF (congestive cardiac failure) ([place]) 08/03/2013  . CHF (congestive heart failure) ([place]) 08/03/2013  . H/O transesophageal echocardiography (TEE) for monitoring 06/21/2013  . Echocardiogram abnormal 04/23/2013  . Mitral regurgitation 04/23/2013  . TI (tricuspid incompetence) 04/23/2013    Past Medical History: Past Medical History:  Diagnosis Date  . A-fib ([place])   . Abnormal Pap smear of vagina and vaginal HPV   . Acute diastolic CHF (congestive heart failure) ([place]) 01/21/2015  . Acute diverticulitis 01/28/2015  . Acute on chronic diastolic heart failure ([place]) 01/21/2015  . Anticoagulation adequate 08/02/2014   Overview:  Xarelto 20hs; CHADS=1+ htn, female, age  Overview:  Xarelto 20hs; CHADS=1+ htn, female, age  . Arthritis    "bones ache" (01/23/2015)  . Atrial fibrillation, persistent ([place]) 08/13/2015   Overview:  symptomatic (fatigue, DOE, decreased exercise tolerance,  palpitations), drug refractory (propafenone, amiodarone), complicated recovery from DCCV possibly related to repiratory obstruction [facility] (patient fear of DCCV)  Overview:  symptomatic (fatigue, DOE, decreased exercise tolerance, palpitations), drug refractory (propafenone, amiodarone), complicated recovery from DCCV possibly relat  . Benign neoplasm of ascending colon   . Benign neoplasm of descending colon   . Benign neoplasm of sigmoid colon   . Blood in stool 01/21/2015  . Breath shortness 08/20/2013  . CAFL (chronic airflow limitation) ([place]) 12/16/2014  . Cancer ([place])    vulvar cancer  . CCF (congestive cardiac failure) ([place]) 08/03/2013   Overview:  ACUTE SYSTOLIC   . Chest pain 08/20/2013  . CHF (congestive heart failure) ([place])   . CHF (congestive heart failure) ([place])   . Chronic anticoagulation    She is on Xarelto for Afib  . Chronic atrial fibrillation ([place])   . Chronic combined systolic and diastolic CHF, NYHA class 3 ([place]) 01/28/2015  . Chronic diastolic heart failure ([place]) 05/05/2015  . Condyloma acuminatum of vulva 08/02/2014  . COPD (chronic obstructive pulmonary disease) ([place]) 08/13/2015  . Diverticulitis large intestine w/o perforation or abscess w/bleeding 01/28/2015  . Diverticulitis of large intestine without perforation or abscess with bleeding   . Dysmetabolic syndrome [id number]   Overview:  BG-166 06/15/2013; elevated BMI; central obesity   . Dyspepsia   . Dyspnea   . Echocardiogram abnormal    Mitral regurgitation, tricuspic regurgitation  .  Essential hypertension 10/07/2014   Trial off acei 10/07/2014  Due to atypical sob (talking = walking)> no real change 11/18/2014    . Family history of adverse reaction to anesthesia    sister gets very nauseated and vomits  . Gastritis without bleeding   . GERD (gastroesophageal reflux disease)   . History of kidney stones   . History of stress test    Myoview 8/16: EF 42%, anteroseptal, inferoseptal, anterior, apical anterior and  apical defect (likely represents breast attenuation versus scar), no ischemia; intermediate risk because of low EF  . HPV (human papilloma virus) anogenital infection    03/21/12 PAP + HR HPV  . HTN (hypertension) 12/16/2014  . Hypertension   . Hypertensive heart disease 05/12/2016  . Hypertensive urgency 01/21/2015  . Insulin resistance   . Kidney stone   . Kidney stones   . Malignant neoplasm of vulva ([place]) 06/19/2014   Overview:  Ms EVALET LUTTRULL is a 74 yo who was referred by Dr Ouida Sills for a vulvar lesion. 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 The specimen had negative margins, but was within 2 mm of the cancer.    . Mitral regurgitation 04/23/2013   Overview:  "Moderate to severe" by TTE, "mild to mod" by TEE  Overview:  "Moderate to severe" by TTE, "mild to mod" by TEE  . Obesity   . OSA (obstructive sleep apnea) 11/27/2014   07/2013 RDI 17/h CPAP 8   . OSA on CPAP   . Other specified diseases of esophagus   . Postoperative nausea and vomiting 1983   many years ago with hysterectomy  . PVC (premature ventricular contraction) 12/16/2014  . Sepsis ([place]) 12/13/2016  . TI (tricuspid incompetence) 04/23/2013   Overview:  "moderate" by TTE   . Urolithiasis   . Vertigo   . Vulvar cancer ([place]) 2016   invasive verrucous carcinoma of the vulva. History of genital warts and vulvar condyloma  . Vulvar cancer, carcinoma ([place]) 05/10/2014    Past Surgical History: Past Surgical History:  Procedure Laterality Date  . ABDOMINAL HYSTERECTOMY    . APPENDECTOMY    . BILATERAL SALPINGOOPHORECTOMY  2003   benign ovarian cancer  . CARDIOVERSION  2014-2015   "[place]"  . COLONOSCOPY WITH PROPOFOL N/A 06/10/2015   Procedure: COLONOSCOPY WITH PROPOFOL;  Surgeon: Lucilla Lame, MD;  Location: [facility] ENDOSCOPY;  Service: Endoscopy;  Laterality: N/A;  . CORONARY STENT INTERVENTION N/A 01/18/2019   Procedure:  CORONARY STENT INTERVENTION;  Surgeon: Jettie Booze, MD;  Location: [place] CV LAB;  Service: Cardiovascular;  Laterality: N/A;  . CYSTOSCOPY W/ RETROGRADES Bilateral 10/19/2016   Procedure: CYSTOSCOPY WITH RETROGRADE PYELOGRAM;  Surgeon: Hollice Espy, MD;  Location: [facility] ORS;  Service: Urology;  Laterality: Bilateral;  . CYSTOSCOPY W/ URETERAL STENT PLACEMENT Left 12/29/2016   Procedure: CYSTOSCOPY WITH STENT REPLACEMENT;  Surgeon: Hollice Espy, MD;  Location: [facility] ORS;  Service: Urology;  Laterality: Left;  . CYSTOSCOPY WITH BIOPSY N/A 10/19/2016   Procedure: CYSTOSCOPY WITH BLADDER BIOPSY, VAGINAL WALL BIOPSY;  Surgeon: Hollice Espy, MD;  Location: [facility] ORS;  Service: Urology;  Laterality: N/A;  . CYSTOSCOPY WITH STENT PLACEMENT Left 12/14/2016   Procedure: CYSTOSCOPY WITH STENT PLACEMENT;  Surgeon: Hollice Espy, MD;  Location: [facility] ORS;  Service: Urology;  Laterality: Left;  . ESOPHAGOGASTRODUODENOSCOPY (EGD) WITH PROPOFOL N/A 10/05/2016   Surgeon: Lucilla Lame, MD;  Location: [facility] ENDOSCOPY;  Results: Barrett's  Esophagus- repeat in 3 years 09/2019  . LEFT HEART CATH AND CORONARY ANGIOGRAPHY N/A 01/18/2019   Procedure: LEFT HEART CATH AND CORONARY ANGIOGRAPHY;  Surgeon: Jettie Booze, MD;  Location: [place] CV LAB;  Service: Cardiovascular;  Laterality: N/A;  . LITHOTRIPSY    . STONE EXTRACTION WITH BASKET Left 12/29/2016   Procedure: STONE EXTRACTION WITH BASKET;  Surgeon: Hollice Espy, MD;  Location: [facility] ORS;  Service: Urology;  Laterality: Left;  . TONSILLECTOMY    . URETEROSCOPY Left 12/29/2016   Procedure: URETEROSCOPY;  Surgeon: Hollice Espy, MD;  Location: [facility] ORS;  Service: Urology;  Laterality: Left;  Marland Kitchen VULVECTOMY Right 05/10/2014   Excisional biopsy of the superior right labial majora mass; [place] PARTIAL  07/02/2014   Re-excision and sentinel node dissection at [facility].     Family History: Family History  Problem  Relation Age of Onset  . Prostate cancer Brother 78       still living and well  . Heart attack Father   . Stroke Mother   . Diabetes Mother   . Hypertension Mother   . Heart attack Brother   . Pancreatic cancer Brother   . Heart disease Sister   . Breast cancer Neg Hx   . Kidney cancer Neg Hx   . Bladder Cancer Neg Hx     Social History: Social History   Socioeconomic History  . Marital status: Divorced    Spouse name: Not on file  . Number of children: 1  . Years of education: GED  . Highest education level: 12th grade  Occupational History  . Occupation: Retired  Tobacco Use  . Smoking status: Former Smoker    Packs/day: 1.00    Years: 50.00    Pack years: 50.00    Types: Cigarettes    Quit date: 05/20/2012    Years since quitting: 7.0  . Smokeless tobacco: Never Used  . Tobacco comment: smoking cessation materials not required  Substance and Sexual Activity  . Alcohol use: No    Alcohol/week: 0.0 standard drinks  . Drug use: No  . Sexual activity: Not Currently  Other Topics Concern  . Not on file  Social History Narrative  . Not on file   Social Determinants of Health   Financial Resource Strain:   . Difficulty of Paying Living Expenses:   Food Insecurity:   . Worried About Charity fundraiser in the Last Year:   . Arboriculturist in the Last Year:   Transportation Needs:   . Film/video editor (Medical):   Marland Kitchen Lack of Transportation (Non-Medical):   Physical Activity:   . Days of Exercise per Week:   . Minutes of Exercise per Session:   Stress:   . Feeling of Stress :   Social Connections:   . Frequency of Communication with Friends and Family:   . Frequency of Social Gatherings with Friends and Family:   . Attends Religious Services:   . Active Member of Clubs or Organizations:   . Attends Archivist Meetings:   Marland Kitchen Marital Status:   Intimate Partner Violence:   . Fear of Current or Ex-Partner:   . Emotionally Abused:   Marland Kitchen  Physically Abused:   . Sexually Abused:     Allergies: Allergies  Allergen Reactions  . Ciprofloxacin Other (See Comments)    SOB    Current Medications: Current Outpatient Medications  Medication Sig Dispense Refill  . acetaminophen (TYLENOL) 325 MG  tablet Take 2 tablets (650 mg total) by mouth every 6 (six) hours as needed for mild pain (or Fever >/= 101).    Marland Kitchen BIOTIN PO Take 1 tablet by mouth daily.     . clobetasol ointment (TEMOVATE) [id number] % Apply 1 application topically as needed (irritaion).    . clopidogrel (PLAVIX) 75 MG tablet Take 1 tablet (75 mg total) by mouth daily with breakfast. 30 tablet 10  . irbesartan (AVAPRO) 75 MG tablet Take 37.5 mg by mouth daily.    . metoprolol tartrate (LOPRESSOR) 100 MG tablet Take 1.5 tablets (150 mg total) by mouth 2 (two) times daily. 270 tablet 3  . nitroGLYCERIN (NITROSTAT) 0.4 MG SL tablet Place 1 tablet (0.4 mg total) under the tongue every 5 (five) minutes as needed. 25 tablet 2  . pantoprazole (PROTONIX) 40 MG tablet TAKE 1 TABLET BY MOUTH EVERY DAY 90 tablet 3  . rosuvastatin (CRESTOR) 20 MG tablet Take 1 tablet (20 mg total) by mouth daily. 90 tablet 3  . torsemide (DEMADEX) 20 MG tablet Take 2 tablets (40 mg total) by mouth daily. Take 2 tablets by mouth once a day, you may take an extra tablet as needed in the afternoon 180 tablet 3  . triamcinolone (NASACORT ALLERGY 24HR) 55 MCG/ACT AERO nasal inhaler Place 2 sprays into the nose daily as needed (allergies).     . vitamin B-12 (CYANOCOBALAMIN) 1000 MCG tablet Take 1,000 mcg by mouth every other day.     Alveda Reasons 20 MG TABS tablet TAKE 1 TABLET (20 MG TOTAL) BY MOUTH DAILY WITH SUPPER. 90 tablet 1   Current Facility-Administered Medications  Medication Dose Route Frequency Provider Last Rate Last Admin  . albuterol (PROVENTIL) (2.5 MG/3ML) 0.083% nebulizer solution 2.5 mg  2.5 mg Nebulization Once Juline Patch, MD       Review of Systems General:  no complaints Skin: no  complaints Eyes: no complaints HEENT: no complaints Breasts: no complaints Pulmonary: no complaints Cardiac: no complaints Gastrointestinal: no complaints Genitourinary/Sexual: no complaints Musculoskeletal: no complaints Hematology: no complaints Neurologic/Psych: no complaints  Objective:  Physical Examination:  BP 112/78 (BP Location: Left Arm, Patient Position: Sitting)   Pulse 92   Temp (!) 97.3 F (36.3 C) (Tympanic)   Resp 18   Wt 217 lb 6.4 oz (98.6 kg)   SpO2 100%   BMI 42.46 kg/m   ECOG Performance Status: 0 - Asymptomatic  GENERAL: Patient is a well appearing female in no acute distress HEENT:  Sclera clear. Anicteric NODES:  Negative axillary, supraclavicular, inguinal lymph node survery LUNGS:  Clear to auscultation bilaterally.   HEART:  Regular rate and rhythm.  ABDOMEN:  Soft, nontender.  No hernias, incisions well healed. No masses or ascites EXTREMITIES:  No peripheral edema. Atraumatic. No cyanosis SKIN:  Clear with no obvious rashes or skin changes.  NEURO:  Nonfocal. Well oriented.  Appropriate affect.  Pelvic: exam chaperoned by nurse;  Vulva: s/p partial right vulvectomy; There is slightly raised white tissue on the anterior right labia minora and clitoris. Atrophic and diffuse mild erythema present. Vagina: normal vagina; Cervix, Adnexa, Uterus: surgically absent; Bimanual: no masses or tenderness. Rectal: deferred.  Procedure note: Informed consent signed and time out performed. Noted she is on Plavix and Xarelto.  Injected 1 ml of 2% xylocaine into upper right labia after cleaning with betadine.  Small punch biopsy done and base cauterized with silver nitrate.  There was no subsequent bleeding and she was observed for 15  minutes.   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  Lichen sclerosis and lichen planus [id number]  controlled on clobetasol propionate 0.05% ointment.    Abnormal white epithelium today on upper right labia and clitoris. Biopsy done.    Plan:   Problem List Items Addressed This Visit      Musculoskeletal and Integument   Lichen planus   Relevant Orders   Surgical pathology     Genitourinary   Vulvar cancer [facility]) - Primary   Relevant Orders   Surgical pathology     Biopsy pending to rule out recurrence of verrucous vulvar cancer. Will contact patient with results.   Use topical corticosteroid regimen for vulvar lichen planus as needed    Return to clinic in 6 months for continued surveillance if biopsy normal.   Verlon Au, NP

## 2019-06-13 NOTE — Progress Notes (Signed)
Right upper vulva sent to pathology.

## 2019-06-13 NOTE — Patient Instructions (Signed)
Vulva Biopsy, Care After This sheet gives you information about how to care for yourself after your procedure. Your doctor may also give you more specific instructions. If you have problems or questions, contact your doctor. What can I expect after the procedure? After the procedure, it is common to have:  Slight bleeding from the biopsy site.  Soreness at the biopsy site. Follow these instructions at home: Biopsy site care   Follow instructions from your doctor about how to take care of your biopsy site. Make sure you: ? Clean the area using water and mild soap two times a day or as told by your doctor. Gently pat the area dry. ? If you were prescribed an antibiotic ointment, apply it as told by your doctor. Do not stop using the antibiotic even if your condition gets better. ? Take a warm water bath (sitz bath) as needed to help with pain. A sitz bath is taken while you are sitting down. The water should only come up to your hips and cover your butt. ? Leave stitches (sutures), skin glue, or skin tape (adhesive) strips in place. They may need to stay in place for 2 weeks or longer. If tape strips get loose and curl up, you may trim the loose edges. Do not remove tape strips completely unless your doctor says it is okay. ? Check your biopsy area every day for signs of infection. Check for:  More redness, swelling, or pain.  More fluid or blood.  Warmth.  Pus or a bad smell. ? Do not rub the biopsy area after peeing (urinating).  Gently pat the area dry, or use a bottle filled with warm water (peri-bottle) to clean the area.  Gently wipe from front to back. Lifestyle  Wear loose, cotton underwear.  Do not wear tight pants.  Do not use a tampon, douche, or put anything in your vagina for at least 1 week or until your doctor says it is okay.  Do not have sex for at least 1 week or until your doctor says it is okay.  Do not exercise until your doctor says it is okay.  Do not  swim or use a hot tub until your doctor says it is okay. You may shower or take a sitz bath. General instructions  Take over-the-counter and prescription medicines only as told by your doctor.  Use a sanitary pad until the bleeding stops.  Keep all follow-up visits as told by your doctor. This is important. Contact a doctor if:  You have more redness, swelling, or pain around your biopsy site.  You have more fluid or blood coming from your biopsy site.  Your biopsy site feels warm when you touch it.  Medicines do not help with your pain. Get help right away if:  You have a lot of bleeding from the vulva.  You have pus or a bad smell coming from your biopsy site.  You have a fever.  You have pain in the lower belly (abdomen). Summary  After the procedure, it is common to have slight bleeding and soreness at the biopsy site.  Follow all instructions as told by your doctor. Clean the area with water and mild soap. Do not rub. Pat the area dry.  Take sitz baths as needed. Leave any stitches in place.  Check your biopsy site for infection. Signs include more redness, swelling, pain, fluid, or blood, or feeling warm when you touch it.  Get help right away if you have a   lot of bleeding, a fever, pus or a bad smell, or pain in your lower belly. This information is not intended to replace advice given to you by your health care provider. Make sure you discuss any questions you have with your health care provider. Document Revised: 09/08/2017 Document Reviewed: 09/08/2017 Elsevier Patient Education  2020 [employer]  

## 2019-07-05 ENCOUNTER — Encounter: Payer: Self-pay | Admitting: Obstetrics and Gynecology

## 2019-07-05 ENCOUNTER — Telehealth: Payer: Self-pay

## 2019-07-05 NOTE — Telephone Encounter (Signed)
Obtained pathology report for 06/13/19 right upper vulva biopsy. Called and left a voicemail with Ms. Rech to return call for results. Copy of report sent to HIM for scanning.  "consistent with fibroepithelial polyp"

## 2019-07-06 ENCOUNTER — Telehealth: Payer: Self-pay

## 2019-07-06 NOTE — Telephone Encounter (Signed)
Missed call from Ms. Nikki Dom. Call returned to go over pathology. No answer. Left voicemail to return call.

## 2019-07-09 ENCOUNTER — Telehealth: Payer: Self-pay | Admitting: Gastroenterology

## 2019-07-09 NOTE — Telephone Encounter (Signed)
Patient called & received a recall 3 year EGD repeat due to Barrett's Esophagus

## 2019-07-10 ENCOUNTER — Other Ambulatory Visit: Payer: Self-pay

## 2019-07-10 DIAGNOSIS — K2270 Barrett's esophagus without dysplasia: Secondary | ICD-10-CM

## 2019-07-10 NOTE — Telephone Encounter (Signed)
Pt scheduled for an EGD at [facility] on 08/07/19.

## 2019-07-17 ENCOUNTER — Other Ambulatory Visit: Payer: Self-pay

## 2019-07-17 ENCOUNTER — Encounter: Payer: Self-pay | Admitting: Gastroenterology

## 2019-07-17 ENCOUNTER — Ambulatory Visit (INDEPENDENT_AMBULATORY_CARE_PROVIDER_SITE_OTHER): Payer: Medicare HMO | Admitting: Gastroenterology

## 2019-07-17 VITALS — BP 111/73 | HR 90 | Temp 97.9°F | Ht 60.0 in | Wt 215.5 lb

## 2019-07-17 DIAGNOSIS — K591 Functional diarrhea: Secondary | ICD-10-CM

## 2019-07-17 DIAGNOSIS — K2270 Barrett's esophagus without dysplasia: Secondary | ICD-10-CM

## 2019-07-17 NOTE — Progress Notes (Signed)
Primary Care Physician: Juline Patch, MD  Primary Gastroenterologist:  Dr. Lucilla Lame  Chief Complaint  Patient presents with  . New Patient (Initial Visit)  . Diarrhea    Patient stated that she continues to have diarrhea, mid abd pain, frequent nausea but no vomiting    HPI: Zarina Lanel Hurtt is a 74 y.o. female here for follow-up of her chronic diarrhea.  The patient was started on dicyclomine by Dr. Pollie Meyer in the past and states that when she took 1 pill a day she became very constipated.  The patient states that her symptoms are worse when she is stressed and even when she goes out for dinner with her family and friends.  This has stopped her from going out on a regular basis.  The patient has a history of a colonoscopy with adenomas and an upper endoscopy with Barrett's esophagus.  The patient reports that she was diagnosed with coronary artery disease and 4 months ago had a stent placed and is now on Plavix and Xarelto.  The patient now comes in for her history of Barrett's esophagus and her continued diarrhea.  Past Medical History:  Diagnosis Date  . A-fib ([place])   . Abnormal Pap smear of vagina and vaginal HPV   . Acute diastolic CHF (congestive heart failure) ([place]) 01/21/2015  . Acute diverticulitis 01/28/2015  . Acute on chronic diastolic heart failure ([place]) 01/21/2015  . Anticoagulation adequate 08/02/2014   Overview:  Xarelto 20hs; CHADS=1+ htn, female, age  Overview:  Xarelto 20hs; CHADS=1+ htn, female, age  . Arthritis    "bones ache" (01/23/2015)  . Atrial fibrillation, persistent ([place]) 08/13/2015   Overview:  symptomatic (fatigue, DOE, decreased exercise tolerance, palpitations), drug refractory (propafenone, amiodarone), complicated recovery from DCCV possibly related to repiratory obstruction [facility] (patient fear of DCCV)  Overview:  symptomatic (fatigue, DOE, decreased exercise tolerance, palpitations), drug refractory (propafenone, amiodarone), complicated recovery  from DCCV possibly relat  . Benign neoplasm of ascending colon   . Benign neoplasm of descending colon   . Benign neoplasm of sigmoid colon   . Blood in stool 01/21/2015  . Breath shortness 08/20/2013  . CAFL (chronic airflow limitation) ([place]) 12/16/2014  . Cancer ([place])    vulvar cancer  . CCF (congestive cardiac failure) ([place]) 08/03/2013   Overview:  ACUTE SYSTOLIC   . Chest pain 08/20/2013  . CHF (congestive heart failure) ([place])   . CHF (congestive heart failure) ([place])   . Chronic anticoagulation    She is on Xarelto for Afib  . Chronic atrial fibrillation ([place])   . Chronic combined systolic and diastolic CHF, NYHA class 3 ([place]) 01/28/2015  . Chronic diastolic heart failure ([place]) 05/05/2015  . Condyloma acuminatum of vulva 08/02/2014  . COPD (chronic obstructive pulmonary disease) ([place]) 08/13/2015  . Diverticulitis large intestine w/o perforation or abscess w/bleeding 01/28/2015  . Diverticulitis of large intestine without perforation or abscess with bleeding   . Dysmetabolic syndrome [id number]   Overview:  BG-166 06/15/2013; elevated BMI; central obesity   . Dyspepsia   . Dyspnea   . Echocardiogram abnormal    Mitral regurgitation, tricuspic regurgitation  . Essential hypertension 10/07/2014   Trial off acei 10/07/2014  Due to atypical sob (talking = walking)> no real change 11/18/2014    . Family history of adverse reaction to anesthesia    sister gets very nauseated and vomits  . Gastritis without bleeding   . GERD (gastroesophageal reflux disease)   . History of kidney stones   .  History of stress test    Myoview 8/16: EF 42%, anteroseptal, inferoseptal, anterior, apical anterior and apical defect (likely represents breast attenuation versus scar), no ischemia; intermediate risk because of low EF  . HPV (human papilloma virus) anogenital infection    03/21/12 PAP + HR HPV  . HTN (hypertension) 12/16/2014  . Hypertension   . Hypertensive heart disease 05/12/2016  . Hypertensive urgency  01/21/2015  . Insulin resistance   . Kidney stone   . Kidney stones   . Malignant neoplasm of vulva ([place]) 06/19/2014   Overview:  Ms BRENIYAH LAURITA is a 74 yo who was referred by Dr Ouida Sills for a vulvar lesion. 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 The specimen had negative margins, but was within 2 mm of the cancer.    . Mitral regurgitation 04/23/2013   Overview:  "Moderate to severe" by TTE, "mild to mod" by TEE  Overview:  "Moderate to severe" by TTE, "mild to mod" by TEE  . Obesity   . OSA (obstructive sleep apnea) 11/27/2014   07/2013 RDI 17/h CPAP 8   . OSA on CPAP   . Other specified diseases of esophagus   . Postoperative nausea and vomiting 1983   many years ago with hysterectomy  . PVC (premature ventricular contraction) 12/16/2014  . Sepsis ([place]) 12/13/2016  . TI (tricuspid incompetence) 04/23/2013   Overview:  "moderate" by TTE   . Urolithiasis   . Vertigo   . Vulvar cancer ([place]) 2016   invasive verrucous carcinoma of the vulva. History of genital warts and vulvar condyloma  . Vulvar cancer, carcinoma ([place]) 05/10/2014    Current Outpatient Medications  Medication Sig Dispense Refill  . acetaminophen (TYLENOL) 325 MG tablet Take 2 tablets (650 mg total) by mouth every 6 (six) hours as needed for mild pain (or Fever >/= 101).    Marland Kitchen BIOTIN PO Take 1 tablet by mouth daily.     . clobetasol ointment (TEMOVATE) [id number] % Apply 1 application topically as needed (irritaion).    . clopidogrel (PLAVIX) 75 MG tablet Take 1 tablet (75 mg total) by mouth daily with breakfast. 30 tablet 10  . irbesartan (AVAPRO) 75 MG tablet Take 37.5 mg by mouth daily.    . metoprolol tartrate (LOPRESSOR) 100 MG tablet Take 1.5 tablets (150 mg total) by mouth 2 (two) times daily. 270 tablet 3  . nitroGLYCERIN (NITROSTAT) 0.4 MG SL tablet Place 1 tablet (0.4 mg total) under the tongue every 5 (five) minutes as needed. 25 tablet  2  . pantoprazole (PROTONIX) 40 MG tablet TAKE 1 TABLET BY MOUTH EVERY DAY 90 tablet 3  . torsemide (DEMADEX) 20 MG tablet Take 2 tablets (40 mg total) by mouth daily. Take 2 tablets by mouth once a day, you may take an extra tablet as needed in the afternoon 180 tablet 3  . vitamin B-12 (CYANOCOBALAMIN) 1000 MCG tablet Take 1,000 mcg by mouth every other day.     Alveda Reasons 20 MG TABS tablet TAKE 1 TABLET (20 MG TOTAL) BY MOUTH DAILY WITH SUPPER. 90 tablet 1   Current Facility-Administered Medications  Medication Dose Route Frequency Provider Last Rate Last Admin  . albuterol (PROVENTIL) (2.5 MG/3ML) 0.083% nebulizer solution 2.5 mg  2.5 mg Nebulization Once Juline Patch, MD        Allergies as of 07/17/2019 - Review Complete 07/17/2019  Allergen Reaction Noted  . Ciprofloxacin Other (See  Comments) 02/04/2015    ROS:  General: Negative for anorexia, weight loss, fever, chills, fatigue, weakness. ENT: Negative for hoarseness, difficulty swallowing , nasal congestion. CV: Negative for chest pain, angina, palpitations, dyspnea on exertion, peripheral edema.  Respiratory: Negative for dyspnea at rest, dyspnea on exertion, cough, sputum, wheezing.  GI: See history of present illness. GU:  Negative for dysuria, hematuria, urinary incontinence, urinary frequency, nocturnal urination.  Endo: Negative for unusual weight change.    Physical Examination:   BP 111/73   Pulse 90   Temp 97.9 F (36.6 C) (Oral)   Ht 5' (1.524 m)   Wt 215 lb 8 oz (97.8 kg)   BMI 42.09 kg/m   General: Well-nourished, well-developed in no acute distress.  Eyes: No icterus. Conjunctivae pink. Neuro: Alert and oriented x 3.  Grossly intact. Skin: Warm and dry, no jaundice.   Psych: Alert and cooperative, normal mood and affect.  Labs:    Imaging Studies: No results found.  Assessment and Plan:   Millenia Foster Stogsdill is a 74 y.o. y/o female who comes in today with a history of Barrett's esophagus and  colon polyps.  The patient has been told that since she has been diagnosed with coronary artery disease and had a stent placed for months ago it is unlikely that she can stop her anticoagulation.  The patient has been told to titrate Imodium so that she can go out to dinner with her family and not have continuous diarrhea throughout the day.  The patient has no worry symptoms such as rectal bleeding unexplained weight loss or family history of colon cancer.  The patient has been told that she should have a repeat colonoscopy and upper endoscopy when she can stop her anticoagulation.  The patient has been explained the plan and agrees with it.     Lucilla Lame, MD. Marval Regal    Note: This dictation was prepared with Dragon dictation along with smaller phrase technology. Any transcriptional errors that result from this process are unintentional.

## 2019-07-20 ENCOUNTER — Other Ambulatory Visit: Payer: Self-pay | Admitting: Interventional Cardiology

## 2019-07-20 NOTE — Telephone Encounter (Signed)
Xarelto 20mg  refill request received. Pt is 74 years old, weight-97.8kg, Crea-1.15 on 02/05/2019, last seen by Daune Perch on 01/25/2019, Diagnosis-Afib, CrCl-62.57ml/min; Dose is appropriate based on dosing criteria. Will send in refill to requested pharmacy.

## 2019-07-26 ENCOUNTER — Other Ambulatory Visit: Payer: Self-pay | Admitting: Interventional Cardiology

## 2019-07-29 NOTE — Progress Notes (Signed)
Cardiology Office Note   Date:  07/30/2019   ID:  Holly Smith 30-Jun-1945, MRN [id number]  PCP:  Holly Patch, MD    No chief complaint on file.  CAD  Wt Readings from Last 3 Encounters:  07/30/19 218 lb 12.8 oz (99.2 kg)  07/17/19 215 lb 8 oz (97.8 kg)  06/13/19 217 lb 6.4 oz (98.6 kg)       History of Present Illness:  Holly Smith is a 74 y.o. female  Who has chronic atrial fibrillation that is now rate controlledand h/otachycardia mediated cardiomyopathy in 2015. She was cardioverted at [facility].EF later improved but she has chronic diastolic dysfunction. Last echo was in 2016. EF was 55-60%. Mild MR noted. She is on Xarelto for a/c.She went back into atrial fibrillation several years ago which is now chronic. Previously seen by Dr. Rayann Smith and not felt to be a good candidate for ablation. She has failed propafenoneand amiodarone in the past. She is on metoprolol for rate control. No h/o CAD. Other medical problems include HTN, HLD and obesity.  In 2019: "We discussed weight loss surgery referral. She had a friend who died many years ago during weight loss surgery so she is hesitant. She prefers the Atkins diet to try to lose weight. She has done well with this in the past. "  She developed symptoms concerning for coronary artery disease and underwent cardiac catheterization in October 2020 showing:   Prox Cx lesion is 50% stenosed.  More distal mid LAD-1 lesion is 80% stenosed.  A drug-eluting stent was successfully placed using a STENT SYNERGY DES 2.25X28.  Post intervention, there is a 0% residual stenosis.  More proximal mid LAD-2 lesion is 80% stenosed.  A drug-eluting stent was successfully placed using a STENT SYNERGY DES 2.75X20 postdilated to 3.0 mm.  Post intervention, there is a 0% residual stenosis.  Mid RCA lesion is 25% stenosed.  Acute Mrg lesion is 75% stenosed. This is a small vessel.  RPDA lesion is 70% stenosed.  This is a small vessel.  The left ventricular systolic function is normal.  LV end diastolic pressure is moderately elevated. LVEDP 26 mm Hg.  The left ventricular ejection fraction is 55-65% by visual estimate.  There is no aortic valve stenosis.   Restart Xarelto tomorrow.  Continue Plavix 75 mg daily in addition to Xarelto.  To reduce bleeding risk, will hold off on continuing aspirin.   After the catheterization, she lost weight.   Since the last visit, she stopped her cholesterol medicine due to cramps in her hands and feet.    She also reports that she falls asleep easily during the day when she is not trying to.  She is not interested in the Covid vaccine.    Past Medical History:  Diagnosis Date  . A-fib ([place])   . Abnormal Pap smear of vagina and vaginal HPV   . Acute diastolic CHF (congestive heart failure) ([place]) 01/21/2015  . Acute diverticulitis 01/28/2015  . Acute on chronic diastolic heart failure ([place]) 01/21/2015  . Anticoagulation adequate 08/02/2014   Overview:  Xarelto 20hs; CHADS=1+ htn, female, age  Overview:  Xarelto 20hs; CHADS=1+ htn, female, age  . Arthritis    "bones ache" (01/23/2015)  . Atrial fibrillation, persistent ([place]) 08/13/2015   Overview:  symptomatic (fatigue, DOE, decreased exercise tolerance, palpitations), drug refractory (propafenone, amiodarone), complicated recovery from DCCV possibly related to repiratory obstruction [facility] (patient fear of DCCV)  Overview:  symptomatic (fatigue, DOE,  decreased exercise tolerance, palpitations), drug refractory (propafenone, amiodarone), complicated recovery from DCCV possibly relat  . Benign neoplasm of ascending colon   . Benign neoplasm of descending colon   . Benign neoplasm of sigmoid colon   . Blood in stool 01/21/2015  . Breath shortness 08/20/2013  . CAFL (chronic airflow limitation) ([place]) 12/16/2014  . Cancer ([place])    vulvar cancer  . CCF (congestive cardiac failure) ([place]) 08/03/2013   Overview:  ACUTE  SYSTOLIC   . Chest pain 08/20/2013  . CHF (congestive heart failure) ([place])   . CHF (congestive heart failure) ([place])   . Chronic anticoagulation    She is on Xarelto for Afib  . Chronic atrial fibrillation ([place])   . Chronic combined systolic and diastolic CHF, NYHA class 3 ([place]) 01/28/2015  . Chronic diastolic heart failure ([place]) 05/05/2015  . Condyloma acuminatum of vulva 08/02/2014  . COPD (chronic obstructive pulmonary disease) ([place]) 08/13/2015  . Diverticulitis large intestine w/o perforation or abscess w/bleeding 01/28/2015  . Diverticulitis of large intestine without perforation or abscess with bleeding   . Dysmetabolic syndrome [id number]   Overview:  BG-166 06/15/2013; elevated BMI; central obesity   . Dyspepsia   . Dyspnea   . Echocardiogram abnormal    Mitral regurgitation, tricuspic regurgitation  . Essential hypertension 10/07/2014   Trial off acei 10/07/2014  Due to atypical sob (talking = walking)> no real change 11/18/2014    . Family history of adverse reaction to anesthesia    sister gets very nauseated and vomits  . Gastritis without bleeding   . GERD (gastroesophageal reflux disease)   . History of kidney stones   . History of stress test    Myoview 8/16: EF 42%, anteroseptal, inferoseptal, anterior, apical anterior and apical defect (likely represents breast attenuation versus scar), no ischemia; intermediate risk because of low EF  . HPV (human papilloma virus) anogenital infection    03/21/12 PAP + HR HPV  . HTN (hypertension) 12/16/2014  . Hypertension   . Hypertensive heart disease 05/12/2016  . Hypertensive urgency 01/21/2015  . Insulin resistance   . Kidney stone   . Kidney stones   . Malignant neoplasm of vulva ([place]) 06/19/2014   Overview:  Holly Smith is a 74 yo who was referred by Dr Holly Smith for a vulvar lesion. 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 The specimen had  negative margins, but was within 2 mm of the cancer.    . Mitral regurgitation 04/23/2013   Overview:  "Moderate to severe" by TTE, "mild to mod" by TEE  Overview:  "Moderate to severe" by TTE, "mild to mod" by TEE  . Obesity   . OSA (obstructive sleep apnea) 11/27/2014   07/2013 RDI 17/h CPAP 8   . OSA on CPAP   . Other specified diseases of esophagus   . Postoperative nausea and vomiting 1983   many years ago with hysterectomy  . PVC (premature ventricular contraction) 12/16/2014  . Sepsis ([place]) 12/13/2016  . TI (tricuspid incompetence) 04/23/2013   Overview:  "moderate" by TTE   . Urolithiasis   . Vertigo   . Vulvar cancer ([place]) 2016   invasive verrucous carcinoma of the vulva. History of genital warts and vulvar condyloma  . Vulvar cancer, carcinoma ([place]) 05/10/2014    Past Surgical History:  Procedure Laterality Date  . ABDOMINAL HYSTERECTOMY    . APPENDECTOMY    . BILATERAL SALPINGOOPHORECTOMY  2003   benign ovarian cancer  . CARDIOVERSION  2014-2015   "[place]"  . COLONOSCOPY WITH PROPOFOL N/A 06/10/2015   Procedure: COLONOSCOPY WITH PROPOFOL;  Surgeon: Lucilla Lame, MD;  Location: [facility] ENDOSCOPY;  Service: Endoscopy;  Laterality: N/A;  . CORONARY STENT INTERVENTION N/A 01/18/2019   Procedure: CORONARY STENT INTERVENTION;  Surgeon: Jettie Booze, MD;  Location: [place];  Service: Cardiovascular;  Laterality: N/A;  . CYSTOSCOPY W/ RETROGRADES Bilateral 10/19/2016   Procedure: CYSTOSCOPY WITH RETROGRADE PYELOGRAM;  Surgeon: Hollice Espy, MD;  Location: [facility] ORS;  Service: Urology;  Laterality: Bilateral;  . CYSTOSCOPY W/ URETERAL STENT PLACEMENT Left 12/29/2016   Procedure: CYSTOSCOPY WITH STENT REPLACEMENT;  Surgeon: Hollice Espy, MD;  Location: [facility] ORS;  Service: Urology;  Laterality: Left;  . CYSTOSCOPY WITH BIOPSY N/A 10/19/2016   Procedure: CYSTOSCOPY WITH BLADDER BIOPSY, VAGINAL WALL BIOPSY;  Surgeon: Hollice Espy, MD;  Location: [facility] ORS;   Service: Urology;  Laterality: N/A;  . CYSTOSCOPY WITH STENT PLACEMENT Left 12/14/2016   Procedure: CYSTOSCOPY WITH STENT PLACEMENT;  Surgeon: Hollice Espy, MD;  Location: [facility] ORS;  Service: Urology;  Laterality: Left;  . ESOPHAGOGASTRODUODENOSCOPY (EGD) WITH PROPOFOL N/A 10/05/2016   Surgeon: Lucilla Lame, MD;  Location: [facility] ENDOSCOPY;  Results: Barrett's Esophagus- repeat in 3 years 09/2019  . LEFT HEART CATH AND CORONARY ANGIOGRAPHY N/A 01/18/2019   Procedure: LEFT HEART CATH AND CORONARY ANGIOGRAPHY;  Surgeon: Jettie Booze, MD;  Location: [place] [facility];  Service: Cardiovascular;  Laterality: N/A;  . LITHOTRIPSY    . STONE EXTRACTION WITH BASKET Left 12/29/2016   Procedure: STONE EXTRACTION WITH BASKET;  Surgeon: Hollice Espy, MD;  Location: [facility] ORS;  Service: Urology;  Laterality: Left;  . TONSILLECTOMY    . URETEROSCOPY Left 12/29/2016   Procedure: URETEROSCOPY;  Surgeon: Hollice Espy, MD;  Location: [facility] ORS;  Service: Urology;  Laterality: Left;  Marland Kitchen VULVECTOMY Right 05/10/2014   Excisional biopsy of the superior right labial majora mass; [place] PARTIAL  07/02/2014   Re-excision and sentinel node [facility] at [facility].      Current Outpatient Medications  Medication Sig Dispense Refill  . acetaminophen (TYLENOL) 325 MG tablet Take 2 tablets (650 mg total) by mouth every 6 (six) hours as needed for mild pain (or Fever >/= 101).    Marland Kitchen BIOTIN PO Take 1 tablet by mouth daily.     . clopidogrel (PLAVIX) 75 MG tablet Take 1 tablet (75 mg total) by mouth daily with breakfast. 30 tablet 10  . irbesartan (AVAPRO) 75 MG tablet Take 37.5 mg by mouth daily.    . metoprolol tartrate (LOPRESSOR) 100 MG tablet Take 1.5 tablets (150 mg total) by mouth 2 (two) times daily. 270 tablet 3  . nitroGLYCERIN (NITROSTAT) 0.4 MG SL tablet Place 1 tablet (0.4 mg total) under the tongue every 5 (five) minutes as needed. 25 tablet 2  . pantoprazole (PROTONIX) 40 MG  tablet TAKE 1 TABLET BY MOUTH EVERY DAY 90 tablet 3  . torsemide (DEMADEX) 20 MG tablet Take 2 tablets (40 mg total) by mouth daily. Take 2 tablets by mouth once a day, you may take an extra tablet as needed in the afternoon 180 tablet 3  . vitamin B-12 (CYANOCOBALAMIN) 1000 MCG tablet Take 1,000 mcg by mouth every other day.     Alveda Reasons 20 MG TABS tablet TAKE 1 TABLET (20 MG TOTAL) BY MOUTH DAILY WITH SUPPER. 90 tablet 1   Current Facility-Administered Medications  Medication Dose Route Frequency Provider Last Rate Last Admin  . albuterol (PROVENTIL) (2.5 MG/3ML) 0.083% nebulizer solution 2.5 mg  2.5 mg Nebulization Once Holly Patch, MD        Allergies:   Ciprofloxacin    Social History:  The patient  reports that she quit smoking about 7 years ago. Her smoking use included cigarettes. She has a 50.00 pack-year smoking history. She has never used smokeless tobacco. She reports that she does not drink alcohol or use drugs.   Family History:  The patient's family history includes Diabetes in her mother; Heart attack in her brother and father; Heart disease in her sister; Hypertension in her mother; Pancreatic cancer in her brother; Prostate cancer (age of onset: 34) in her brother; Stroke in her mother.    ROS:  Please see the history of present illness.   Otherwise, review of systems are positive for difficulty losing .   All other systems are reviewed and negative.    PHYSICAL EXAM: VS:  BP 110/70   Pulse 90   Ht 5' (1.524 m)   Wt 218 lb 12.8 oz (99.2 kg)   SpO2 95%   BMI 42.73 kg/m  , BMI Body mass index is 42.73 kg/m. GEN: Well nourished, well developed, in no acute distress  HEENT: normal  Neck: no JVD, carotid bruits, or masses Cardiac: irregularly irregular; no murmurs, rubs, or gallops,no edema  Respiratory:  clear to auscultation bilaterally, normal work of breathing GI: soft, nontender, nondistended, + BS Holly: no deformity or atrophy  Skin: warm and dry, no  rash Neuro:  Strength and sensation are intact Psych: euthymic mood, full affect   EKG:   The ekg ordered in October 2020 demonstrates atrial fibrillation, rate controlled   Recent Labs: 01/15/2019: Hemoglobin 13.7; Platelets 247 02/05/2019: ALT 18; BUN 15; Creatinine, Ser 1.15; Potassium 3.6; Sodium 141   Lipid Panel    Component Value Date/Time   CHOL 109 02/05/2019 1315   CHOL 165 03/18/2016 1056   TRIG 146 02/05/2019 1315   HDL 37 (L) 02/05/2019 1315   HDL 42 03/18/2016 1056   CHOLHDL 2.9 02/05/2019 1315   VLDL 29 02/05/2019 1315   LDLCALC 43 02/05/2019 1315   LDLCALC 89 03/18/2016 1056     Other studies Reviewed: Additional studies/ records that were reviewed today with results demonstrating: PMD labs reviewed.   ASSESSMENT AND PLAN:  1.   CAD: No angina.    Continue aggressive secondary prevention.  2.   Hyperlipidemia: Restart Crestor 10 mg 3 days/week. 3.   Obesity: COntinue attempts at weight loss. Known OSA. She stopped her Crestor on her own due to perceived side effect of hand cramping.  4.   Hypertension: The current medical regimen is effective;  continue present plan and medications. 5.   Atrial fibrillation: No sx of AFib.  Rate controlled.  No bleeding problems at this time.  She does have endoscopy that has been postponed due to her stent placement. 6.   Anticoagulation: Xarelto for stroke prevention.   Current medicines are reviewed at length with the patient today.  The patient concerns regarding her medicines were addressed.  The following changes have been made:  No change  Labs/ tests ordered today include:  No orders of the defined types were placed in this encounter.   Recommend 150 minutes/week of aerobic exercise Low fat, low carb, high fiber diet recommended  Disposition:   FU in 6 months   Signed, Larae Grooms, MD  07/30/2019 2:05 PM    [facility] [address], [place], [place]  [address] Phone: [phone]; Fax: [phone]

## 2019-07-30 ENCOUNTER — Other Ambulatory Visit: Payer: Self-pay

## 2019-07-30 ENCOUNTER — Ambulatory Visit: Payer: Medicare HMO | Admitting: Interventional Cardiology

## 2019-07-30 ENCOUNTER — Encounter: Payer: Self-pay | Admitting: Interventional Cardiology

## 2019-07-30 ENCOUNTER — Other Ambulatory Visit: Payer: Self-pay | Admitting: Interventional Cardiology

## 2019-07-30 VITALS — BP 110/70 | HR 90 | Ht 60.0 in | Wt 218.8 lb

## 2019-07-30 DIAGNOSIS — Z955 Presence of coronary angioplasty implant and graft: Secondary | ICD-10-CM

## 2019-07-30 DIAGNOSIS — I4821 Permanent atrial fibrillation: Secondary | ICD-10-CM

## 2019-07-30 DIAGNOSIS — I10 Essential (primary) hypertension: Secondary | ICD-10-CM

## 2019-07-30 DIAGNOSIS — I5032 Chronic diastolic (congestive) heart failure: Secondary | ICD-10-CM | POA: Diagnosis not present

## 2019-07-30 DIAGNOSIS — E785 Hyperlipidemia, unspecified: Secondary | ICD-10-CM | POA: Diagnosis not present

## 2019-07-30 DIAGNOSIS — Z6841 Body Mass Index (BMI) 40.0 and over, adult: Secondary | ICD-10-CM | POA: Diagnosis not present

## 2019-07-30 MED ORDER — ROSUVASTATIN CALCIUM 10 MG PO TABS: ORAL_TABLET | ORAL | 11 refills | Status: DC

## 2019-07-30 NOTE — Patient Instructions (Signed)
Medication Instructions:  Your physician has recommended you make the following change in your medication:   START: rosuvastatin (crestor) 10 mg tablet: Take 1 tablet by mouth 3 times a day  *If you need a refill on your cardiac medications before your next appointment, please call your pharmacy*   Lab Work: None ordered  If you have labs (blood work) drawn today and your tests are completely normal, you will receive your results only by: Marland Kitchen MyChart Message (if you have MyChart) OR . A paper copy in the mail If you have any lab test that is abnormal or we need to change your treatment, we will call you to review the results.   Testing/Procedures: None ordered   Follow-Up: At [facility], you and your health needs are our priority.  As part of our continuing mission to provide you with exceptional heart care, we have created designated Provider Care Teams.  These Care Teams include your primary Cardiologist (physician) and Advanced Practice Providers (APPs -  Physician Assistants and Nurse Practitioners) who all work together to provide you with the care you need, when you need it.  We recommend signing up for the patient portal called "MyChart".  Sign up information is provided on this After Visit Summary.  MyChart is used to connect with patients for Virtual Visits (Telemedicine).  Patients are able to view lab/test results, encounter notes, upcoming appointments, etc.  Non-urgent messages can be sent to your provider as well.   To learn more about what you can do with MyChart, go to [email or web address].    Your next appointment:   6 month(s)  The format for your next appointment:   In Person  Provider:   You may see Larae Grooms, MD or one of the following Advanced Practice Providers on your designated Care Team:    Melina Copa, PA-C  Ermalinda Barrios, PA-C    Other Instructions

## 2019-08-01 ENCOUNTER — Telehealth: Payer: Self-pay | Admitting: Interventional Cardiology

## 2019-08-01 NOTE — Telephone Encounter (Signed)
Medical records requested from [facility] - Dr. Saralyn Pilar. 08/01/19 vlm

## 2019-08-02 ENCOUNTER — Telehealth: Payer: Self-pay

## 2019-08-02 NOTE — Telephone Encounter (Signed)
NOTES FROM [facility] WAS PLACED IN DR TURNER BOX

## 2019-08-07 ENCOUNTER — Ambulatory Visit: Admit: 2019-08-07 | Payer: Medicare HMO | Admitting: Gastroenterology

## 2019-08-07 SURGERY — ESOPHAGOGASTRODUODENOSCOPY (EGD) WITH PROPOFOL
Anesthesia: General

## 2019-11-09 ENCOUNTER — Other Ambulatory Visit: Payer: Self-pay | Admitting: Interventional Cardiology

## 2019-11-23 DIAGNOSIS — H25811 Combined forms of age-related cataract, right eye: Secondary | ICD-10-CM | POA: Diagnosis not present

## 2019-11-23 DIAGNOSIS — H2512 Age-related nuclear cataract, left eye: Secondary | ICD-10-CM | POA: Diagnosis not present

## 2019-11-23 DIAGNOSIS — H57813 Brow ptosis, bilateral: Secondary | ICD-10-CM | POA: Diagnosis not present

## 2019-11-23 DIAGNOSIS — H5203 Hypermetropia, bilateral: Secondary | ICD-10-CM | POA: Diagnosis not present

## 2019-11-23 DIAGNOSIS — H524 Presbyopia: Secondary | ICD-10-CM | POA: Diagnosis not present

## 2019-11-30 DIAGNOSIS — H25811 Combined forms of age-related cataract, right eye: Secondary | ICD-10-CM | POA: Diagnosis not present

## 2019-12-02 ENCOUNTER — Other Ambulatory Visit: Payer: Self-pay | Admitting: Cardiology

## 2019-12-03 ENCOUNTER — Telehealth: Payer: Self-pay | Admitting: Nurse Practitioner

## 2019-12-03 NOTE — Telephone Encounter (Signed)
Writer was asked to phone patient and move appt from 12-12-19 to 12-11-19. Writer spoke with patient on this date and moved appt to 12-11-19.

## 2019-12-11 ENCOUNTER — Inpatient Hospital Stay: Payer: Medicare HMO | Admitting: Nurse Practitioner

## 2019-12-12 ENCOUNTER — Ambulatory Visit: Payer: Medicare HMO

## 2019-12-17 DIAGNOSIS — H2511 Age-related nuclear cataract, right eye: Secondary | ICD-10-CM | POA: Diagnosis not present

## 2019-12-17 DIAGNOSIS — H25811 Combined forms of age-related cataract, right eye: Secondary | ICD-10-CM | POA: Diagnosis not present

## 2019-12-25 ENCOUNTER — Telehealth: Payer: Medicare HMO | Admitting: Nurse Practitioner

## 2019-12-25 DIAGNOSIS — J069 Acute upper respiratory infection, unspecified: Secondary | ICD-10-CM | POA: Diagnosis not present

## 2019-12-25 MED ORDER — FLUTICASONE PROPIONATE 50 MCG/ACT NA SUSP: 2.0000 | Freq: Every day | NASAL | 6 refills | Status: AC

## 2019-12-25 MED ORDER — BENZONATATE 100 MG PO CAPS: 100.0000 mg | ORAL_CAPSULE | Freq: Three times a day (TID) | ORAL | 0 refills | Status: DC | PRN

## 2019-12-25 NOTE — Progress Notes (Signed)

## 2019-12-26 DIAGNOSIS — Z881 Allergy status to other antibiotic agents status: Secondary | ICD-10-CM | POA: Diagnosis not present

## 2019-12-26 DIAGNOSIS — Z87891 Personal history of nicotine dependence: Secondary | ICD-10-CM | POA: Diagnosis not present

## 2019-12-26 DIAGNOSIS — I509 Heart failure, unspecified: Secondary | ICD-10-CM | POA: Diagnosis not present

## 2019-12-26 DIAGNOSIS — J441 Chronic obstructive pulmonary disease with (acute) exacerbation: Secondary | ICD-10-CM | POA: Diagnosis not present

## 2019-12-26 DIAGNOSIS — R0602 Shortness of breath: Secondary | ICD-10-CM | POA: Diagnosis not present

## 2019-12-26 DIAGNOSIS — I482 Chronic atrial fibrillation, unspecified: Secondary | ICD-10-CM | POA: Diagnosis not present

## 2019-12-26 DIAGNOSIS — R918 Other nonspecific abnormal finding of lung field: Secondary | ICD-10-CM | POA: Diagnosis not present

## 2019-12-26 DIAGNOSIS — J849 Interstitial pulmonary disease, unspecified: Secondary | ICD-10-CM | POA: Diagnosis not present

## 2019-12-26 DIAGNOSIS — I110 Hypertensive heart disease with heart failure: Secondary | ICD-10-CM | POA: Diagnosis not present

## 2019-12-26 DIAGNOSIS — Z20822 Contact with and (suspected) exposure to covid-19: Secondary | ICD-10-CM | POA: Diagnosis not present

## 2019-12-26 DIAGNOSIS — E876 Hypokalemia: Secondary | ICD-10-CM | POA: Diagnosis not present

## 2019-12-26 DIAGNOSIS — I4891 Unspecified atrial fibrillation: Secondary | ICD-10-CM | POA: Diagnosis not present

## 2019-12-28 DIAGNOSIS — I10 Essential (primary) hypertension: Secondary | ICD-10-CM | POA: Diagnosis not present

## 2019-12-28 DIAGNOSIS — Z136 Encounter for screening for cardiovascular disorders: Secondary | ICD-10-CM | POA: Diagnosis not present

## 2019-12-28 DIAGNOSIS — E119 Type 2 diabetes mellitus without complications: Secondary | ICD-10-CM | POA: Diagnosis not present

## 2019-12-29 ENCOUNTER — Other Ambulatory Visit: Payer: Self-pay | Admitting: Interventional Cardiology

## 2020-01-07 ENCOUNTER — Inpatient Hospital Stay: Payer: Medicare HMO | Attending: Nurse Practitioner | Admitting: Nurse Practitioner

## 2020-01-08 ENCOUNTER — Telehealth: Payer: Self-pay

## 2020-01-08 NOTE — Telephone Encounter (Signed)
Voicemail left with Holly Smith to return to reschedule gyn oncology appointment.

## 2020-01-10 DIAGNOSIS — E1165 Type 2 diabetes mellitus with hyperglycemia: Secondary | ICD-10-CM | POA: Diagnosis not present

## 2020-01-10 DIAGNOSIS — E876 Hypokalemia: Secondary | ICD-10-CM | POA: Diagnosis not present

## 2020-01-10 DIAGNOSIS — I10 Essential (primary) hypertension: Secondary | ICD-10-CM | POA: Diagnosis not present

## 2020-01-11 DIAGNOSIS — R69 Illness, unspecified: Secondary | ICD-10-CM | POA: Diagnosis not present

## 2020-01-25 DIAGNOSIS — I10 Essential (primary) hypertension: Secondary | ICD-10-CM | POA: Diagnosis not present

## 2020-01-25 DIAGNOSIS — E876 Hypokalemia: Secondary | ICD-10-CM | POA: Diagnosis not present

## 2020-01-25 DIAGNOSIS — M25511 Pain in right shoulder: Secondary | ICD-10-CM | POA: Diagnosis not present

## 2020-01-25 DIAGNOSIS — E119 Type 2 diabetes mellitus without complications: Secondary | ICD-10-CM | POA: Diagnosis not present

## 2020-01-28 DIAGNOSIS — R69 Illness, unspecified: Secondary | ICD-10-CM | POA: Diagnosis not present

## 2020-01-28 DIAGNOSIS — H25812 Combined forms of age-related cataract, left eye: Secondary | ICD-10-CM | POA: Diagnosis not present

## 2020-01-28 DIAGNOSIS — H2512 Age-related nuclear cataract, left eye: Secondary | ICD-10-CM | POA: Diagnosis not present

## 2020-02-03 ENCOUNTER — Other Ambulatory Visit: Payer: Self-pay | Admitting: Cardiology

## 2020-02-04 ENCOUNTER — Other Ambulatory Visit: Payer: Self-pay | Admitting: *Deleted

## 2020-02-04 MED ORDER — TORSEMIDE 20 MG PO TABS
40.0000 mg | ORAL_TABLET | Freq: Every day | ORAL | 3 refills | Status: DC
Start: 2020-02-04 — End: 2020-03-26

## 2020-02-07 ENCOUNTER — Other Ambulatory Visit: Payer: Self-pay | Admitting: Interventional Cardiology

## 2020-02-07 NOTE — Telephone Encounter (Signed)
Prescription refill request for Xarelto received.   Last office visit: [place], 07/30/2019 Weight: 99.2 kg Age: 74 yo Scr: 0.84, 12/26/2019 CrCl: 89 ml/min   Prescription refill sent.

## 2020-02-10 NOTE — Progress Notes (Signed)
Cardiology Office Note   Date:  02/11/2020   ID:  Holly Smith February 04, 1946, MRN [id number]  PCP:  Holly Riches, NP    No chief complaint on file.  CAD  Wt Readings from Last 3 Encounters:  02/11/20 192 lb 12.8 oz (87.5 kg)  07/30/19 218 lb 12.8 oz (99.2 kg)  07/17/19 215 lb 8 oz (97.8 kg)       History of Present Illness: Holly Smith is a 74 y.o. female  Who haschronic atrial fibrillation that is now rate controlledand h/otachycardia mediated cardiomyopathy in 2015. She was cardioverted at [facility].EF later improved but she has chronic diastolic dysfunction. Last echo was in 2016. EF was 55-60%. Mild MR noted. She is on Xarelto for a/c.She went back into atrial fibrillation several years ago which is now chronic. Previously seen by Holly Smith and not felt to be a good candidate for ablation. She has failed propafenoneand amiodarone in the past. She is on metoprolol for rate control. No h/o CAD. Other medical problems include HTN, HLD and obesity.  In 2019: "We discussed weight loss surgery referral. She had a friend who died many years ago during weight loss surgery so she is hesitant. She prefers the Atkins diet to try to lose weight. She has done well with this in the past."  She developed symptoms concerning for coronary artery disease and underwent cardiac catheterization in October 2020 showing:  " Prox Cx lesion is 50% stenosed.  More distal mid LAD-1 lesion is 80% stenosed.  A drug-eluting stent was successfully placed using a STENT SYNERGY DES 2.25X28.  Post intervention, there is a 0% residual stenosis.  More proximal mid LAD-2 lesion is 80% stenosed.  A drug-eluting stent was successfully placed using a STENT SYNERGY DES 2.75X20 postdilated to 3.0 mm.  Post intervention, there is a 0% residual stenosis.  Mid RCA lesion is 25% stenosed.  Acute Mrg lesion is 75% stenosed. This is a small vessel.  RPDA lesion is 70%  stenosed. This is a small vessel.  The left ventricular systolic function is normal.  LV end diastolic pressure is moderately elevated. LVEDP 26 mm Hg.  The left ventricular ejection fraction is 55-65% by visual estimate.  There is no aortic valve stenosis.  Restart Xarelto tomorrow. Continue Plavix 75 mg daily in addition to Xarelto. To reduce bleeding risk, will hold off on continuing aspirin. "  After the catheterization, she lost weight.  At the 5/21 visit:, "she had stopped her cholesterol medicine due to cramps in her hands and feet.  She also reports that she falls asleep easily during the day when she is not trying to.  She is not interested in the Covid vaccine."  Crestor was added back 3x/week.  She is taking it 1-2x/week.  She was diagnosed with DM.  Metformin was started.  Also added [place].  Denies : Chest pain. Dizziness. Leg edema. Nitroglycerin use. Orthopnea. Palpitations. Paroxysmal nocturnal dyspnea. Shortness of breath. Syncope.       Past Medical History:  Diagnosis Date  . A-fib ([place])   . Abnormal Pap smear of vagina and vaginal HPV   . Acute diastolic CHF (congestive heart failure) ([place]) 01/21/2015  . Acute diverticulitis 01/28/2015  . Acute on chronic diastolic heart failure ([place]) 01/21/2015  . Anticoagulation adequate 08/02/2014   Overview:  Xarelto 20hs; CHADS=1+ htn, female, age  Overview:  Xarelto 20hs; CHADS=1+ htn, female, age  . Arthritis    "bones ache" (01/23/2015)  .  Atrial fibrillation, persistent ([place]) 08/13/2015   Overview:  symptomatic (fatigue, DOE, decreased exercise tolerance, palpitations), drug refractory (propafenone, amiodarone), complicated recovery from DCCV possibly related to repiratory obstruction [facility] (patient fear of DCCV)  Overview:  symptomatic (fatigue, DOE, decreased exercise tolerance, palpitations), drug refractory (propafenone, amiodarone), complicated recovery from DCCV possibly relat  . Benign neoplasm of ascending colon    . Benign neoplasm of descending colon   . Benign neoplasm of sigmoid colon   . Blood in stool 01/21/2015  . Breath shortness 08/20/2013  . CAFL (chronic airflow limitation) ([place]) 12/16/2014  . Cancer ([place])    vulvar cancer  . CCF (congestive cardiac failure) ([place]) 08/03/2013   Overview:  ACUTE SYSTOLIC   . Chest pain 08/20/2013  . CHF (congestive heart failure) ([place])   . CHF (congestive heart failure) ([place])   . Chronic anticoagulation    She is on Xarelto for Afib  . Chronic atrial fibrillation ([place])   . Chronic combined systolic and diastolic CHF, NYHA class 3 ([place]) 01/28/2015  . Chronic diastolic heart failure ([place]) 05/05/2015  . Condyloma acuminatum of vulva 08/02/2014  . COPD (chronic obstructive pulmonary disease) ([place]) 08/13/2015  . Diverticulitis large intestine w/o perforation or abscess w/bleeding 01/28/2015  . Diverticulitis of large intestine without perforation or abscess with bleeding   . Dysmetabolic syndrome 03/22/7508   Overview:  BG-166 06/15/2013; elevated BMI; central obesity   . Dyspepsia   . Dyspnea   . Echocardiogram abnormal    Mitral regurgitation, tricuspic regurgitation  . Essential hypertension 10/07/2014   Trial off acei 10/07/2014  Due to atypical sob (talking = walking)> no real change 11/18/2014    . Family history of adverse reaction to anesthesia    sister gets very nauseated and vomits  . Gastritis without bleeding   . GERD (gastroesophageal reflux disease)   . History of kidney stones   . History of stress test    Myoview 8/16: EF 42%, anteroseptal, inferoseptal, anterior, apical anterior and apical defect (likely represents breast attenuation versus scar), no ischemia; intermediate risk because of low EF  . HPV (human papilloma virus) anogenital infection    03/21/12 PAP + HR HPV  . HTN (hypertension) 12/16/2014  . Hypertension   . Hypertensive heart disease 05/12/2016  . Hypertensive urgency 01/21/2015  . Insulin resistance   . Kidney stone   . Kidney  stones   . Malignant neoplasm of vulva ([place]) 06/19/2014   Overview:  Ms Holly Smith is a 74 yo who was referred by Holly Smith for a vulvar lesion. 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 The specimen had negative margins, but was within 2 mm of the cancer.    . Mitral regurgitation 04/23/2013   Overview:  "Moderate to severe" by TTE, "mild to mod" by TEE  Overview:  "Moderate to severe" by TTE, "mild to mod" by TEE  . Obesity   . OSA (obstructive sleep apnea) 11/27/2014   07/2013 RDI 17/h CPAP 8   . OSA on CPAP   . Other specified diseases of esophagus   . Postoperative nausea and vomiting 1983   many years ago with hysterectomy  . PVC (premature ventricular contraction) 12/16/2014  . Sepsis ([place]) 12/13/2016  . TI (tricuspid incompetence) 04/23/2013   Overview:  "moderate" by TTE   . Urolithiasis   . Vertigo   . Vulvar cancer ([place]) 2016   invasive verrucous carcinoma of the vulva.  History of genital warts and vulvar condyloma  . Vulvar cancer, carcinoma ([place]) 05/10/2014    Past Surgical History:  Procedure Laterality Date  . ABDOMINAL HYSTERECTOMY    . APPENDECTOMY    . BILATERAL SALPINGOOPHORECTOMY  2003   benign ovarian cancer  . CARDIOVERSION  2014-2015   "[place]"  . COLONOSCOPY WITH PROPOFOL N/A 06/10/2015   Procedure: COLONOSCOPY WITH PROPOFOL;  Surgeon: Lucilla Lame, MD;  Location: [facility] ENDOSCOPY;  Service: Endoscopy;  Laterality: N/A;  . CORONARY STENT INTERVENTION N/A 01/18/2019   Procedure: CORONARY STENT INTERVENTION;  Surgeon: Jettie Booze, MD;  Location: [facility];  Service: Cardiovascular;  Laterality: N/A;  . CYSTOSCOPY W/ RETROGRADES Bilateral 10/19/2016   Procedure: CYSTOSCOPY WITH RETROGRADE PYELOGRAM;  Surgeon: Hollice Espy, MD;  Location: [facility] ORS;  Service: Urology;  Laterality: Bilateral;  . CYSTOSCOPY W/ URETERAL STENT PLACEMENT Left 12/29/2016    Procedure: CYSTOSCOPY WITH STENT REPLACEMENT;  Surgeon: Hollice Espy, MD;  Location: [facility] ORS;  Service: Urology;  Laterality: Left;  . CYSTOSCOPY WITH BIOPSY N/A 10/19/2016   Procedure: CYSTOSCOPY WITH BLADDER BIOPSY, VAGINAL WALL BIOPSY;  Surgeon: Hollice Espy, MD;  Location: [facility] ORS;  Service: Urology;  Laterality: N/A;  . CYSTOSCOPY WITH STENT PLACEMENT Left 12/14/2016   Procedure: CYSTOSCOPY WITH STENT PLACEMENT;  Surgeon: Hollice Espy, MD;  Location: [facility] OR[facility];  Service: Urology;  Laterality: Left;  . ESOPHAGOGASTRODUODENOSCOPY (EGD) WITH PROPOFOL N/A 10/05/2016   Surgeon: Lucilla Lame, MD;  Location: [facility] ENDOSCOPY;  Results: Barrett's Esophagus- repeat in 3 years 09/2019  . LEFT HEART CATH AND CORONARY ANGIOGRAPHY N/A 01/18/2019   Procedure: LEFT HEART CATH AND CORONARY ANGIOGRAPHY;  Surgeon: Jettie Booze, MD;  Location: [facility];  Service: Cardiovascular;  Laterality: N/A;  . LITHOTRIPSY    . STONE EXTRACTION WITH BASKET Left 12/29/2016   Procedure: STONE EXTRACTION WITH BASKET;  Surgeon: Hollice Espy, MD;  Location: [facility] OR[facility];  Service: Urology;  Laterality: Left;  . TONSILLECTOMY    . URETEROSCOPY Left 12/29/2016   Procedure: URETEROSCOPY;  Surgeon: Hollice Espy, MD;  Location: [facility] OR[facility];  Service: Urology;  Laterality: Left;  Marland Kitchen VULVECTOMY Right 05/10/2014   Excisional biopsy of the superior right labial majora mass; [place] PARTIAL  07/02/2014   Re-excision and sentinel node dissection at [facility].      Current Outpatient Medications  Medication Sig Dispense Refill  . acetaminophen (TYLENOL) 325 MG tablet Take 2 tablets (650 mg total) by mouth every 6 (six) hours as needed for mild pain (or Fever >/= 101).    . benzonatate (TESSALON PERLES) 100 MG capsule Take 1 capsule (100 mg total) by mouth 3 (three) times daily as needed. 20 capsule 0  . BIOTIN PO Take 1 tablet by mouth daily.     . clopidogrel (PLAVIX) 75 MG tablet TAKE 1  TABLET (75 MG TOTAL) BY MOUTH DAILY WITH BREAKFAST. 90 tablet 3  . fluticasone (FLONASE) 50 MCG/ACT nasal spray Place 2 sprays into both nostrils daily. 16 g 6  . irbesartan (AVAPRO) 75 MG tablet TAKE 1 TABLET BY MOUTH EVERY DAY 90 tablet 2  . metoprolol tartrate (LOPRESSOR) 100 MG tablet TAKE 1 AND 1/2 TABLETS (150 MG TOTAL) BY MOUTH 2 (TWO) TIMES DAILY. 270 tablet 1  . nitroGLYCERIN (NITROSTAT) 0.4 MG SL tablet Place 1 tablet (0.4 mg total) under the tongue every 5 (five) minutes as needed. 25 tablet 2  . pantoprazole (PROTONIX) 40 MG tablet TAKE 1 TABLET BY MOUTH EVERY  DAY 90 tablet 2  . Potassium Chloride ER 20 MEQ TBCR Take 1 tablet by mouth daily.    . rosuvastatin (CRESTOR) 10 MG tablet Take 1 tablet 3 times a week 12 tablet 11  . torsemide (DEMADEX) 20 MG tablet Take 2 tablets (40 mg total) by mouth daily. Take 2 tablets by mouth once a day, you may take an extra tablet as needed in the afternoon 180 tablet 3  . vitamin B-12 (CYANOCOBALAMIN) 1000 MCG tablet Take 1,000 mcg by mouth every other day.     Alveda Reasons 20 MG TABS tablet TAKE 1 TABLET (20 MG TOTAL) BY MOUTH DAILY WITH SUPPER. 90 tablet 1   Current Facility-Administered Medications  Medication Dose Route Frequency Provider Last Rate Last Admin  . albuterol (PROVENTIL) (2.5 MG/3ML) 0.083% nebulizer solution 2.5 mg  2.5 mg Nebulization Once Juline Patch, MD        Allergies:   Ciprofloxacin    Social History:  The patient  reports that she quit smoking about 7 years ago. Her smoking use included cigarettes. She has a 50.00 pack-year smoking history. She has never used smokeless tobacco. She reports that she does not drink alcohol and does not use drugs.   Family History:  The patient's family history includes Diabetes in her mother; Heart attack in her brother and father; Heart disease in her sister; Hypertension in her mother; Pancreatic cancer in her brother; Prostate cancer (age of onset: 60) in her brother; Stroke in her  mother.    ROS:  Please see the history of present illness.   Otherwise, review of systems are positive for stomach upset with metformin, improved when Wilder Glade was added and metformin changed to time release.   All other systems are reviewed and negative.    PHYSICAL EXAM: VS:  BP 126/72   Pulse 98   Ht 5' (1.524 m)   Wt 192 lb 12.8 oz (87.5 kg)   SpO2 96%   BMI 37.65 kg/m  , BMI Body mass index is 37.65 kg/m. GEN: Well nourished, well developed, in no acute distress  HEENT: normal  Neck: no JVD, carotid bruits, or masses Cardiac: irregularly irregular; no murmurs, rubs, or gallops,no edema  Respiratory:  clear to auscultation bilaterally, normal work of breathing GI: soft, nontender, nondistended, + BS MS: no deformity or atrophy  Skin: warm and dry, no rash Neuro:  Strength and sensation are intact Psych: euthymic mood, full affect   EKG:   The ekg ordered today demonstrates AFib, PVCs, no ST changes   Recent Labs: No results found for requested labs within last 8760 hours.   Lipid Panel    Component Value Date/Time   CHOL 109 02/05/2019 1315   CHOL 165 03/18/2016 1056   TRIG 146 02/05/2019 1315   HDL 37 (L) 02/05/2019 1315   HDL 42 03/18/2016 1056   CHOLHDL 2.9 02/05/2019 1315   VLDL 29 02/05/2019 1315   LDLCALC 43 02/05/2019 1315   LDLCALC 89 03/18/2016 1056     Other studies Reviewed: Additional studies/ records that were reviewed today with results demonstrating: .   ASSESSMENT AND PLAN:  1. CAD: No angina. Continue aggressive secondary prevention. We discussed stopping Plavix since she is on Xarelto.  She feels well and does not want to change anything.  If there is a bleeding issue, could stop Plavix.  2. Hyperlipidemia: Tolerating low dose Crestor. Taking Vit D.  Hopefully, this will help tolerate the Crestor.  Try to increase to 3x/week.  3. Obesity: Portion control of higher carb foods.  Whole food plant based diet recommended. 4. HTN: The current  medical regimen is effective;  continue present plan and medications. 5. AFib: No palpitations.  She is adjusting her diuretic dose based on how she feels and how she eats.   6. Anticoagulation: No bleeding issues noted.  Screening Colonoscopy has been on hold due to need for antiplatelet treatment. Ok to hold Plavix 5 days prior.  Likely ok to hold Xarelto 2 days but wouldcheck with PharmD. 7. DM: A1C to be rechecked in Jan.   High fiber diet.  Whole food, plant based diet recommended.    Current medicines are reviewed at length with the patient today.  The patient concerns regarding her medicines were addressed.  The following changes have been made:  No change  Labs/ tests ordered today include:  No orders of the defined types were placed in this encounter.   Recommend 150 minutes/week of aerobic exercise Low fat, low carb, high fiber diet recommended  Disposition:   FU in 6 months   Signed, Larae Grooms, MD  02/11/2020 10:15 AM    [facility] [address], [place], [place]  [address] Phone: [phone]; Fax: [phone]

## 2020-02-11 ENCOUNTER — Ambulatory Visit: Payer: Medicare HMO | Admitting: Interventional Cardiology

## 2020-02-11 ENCOUNTER — Encounter: Payer: Self-pay | Admitting: Interventional Cardiology

## 2020-02-11 ENCOUNTER — Other Ambulatory Visit: Payer: Self-pay

## 2020-02-11 VITALS — BP 126/72 | HR 98 | Ht 60.0 in | Wt 192.8 lb

## 2020-02-11 DIAGNOSIS — I5032 Chronic diastolic (congestive) heart failure: Secondary | ICD-10-CM

## 2020-02-11 DIAGNOSIS — Z7901 Long term (current) use of anticoagulants: Secondary | ICD-10-CM | POA: Diagnosis not present

## 2020-02-11 DIAGNOSIS — I10 Essential (primary) hypertension: Secondary | ICD-10-CM

## 2020-02-11 DIAGNOSIS — I25118 Atherosclerotic heart disease of native coronary artery with other forms of angina pectoris: Secondary | ICD-10-CM

## 2020-02-11 DIAGNOSIS — E782 Mixed hyperlipidemia: Secondary | ICD-10-CM

## 2020-02-11 DIAGNOSIS — I4821 Permanent atrial fibrillation: Secondary | ICD-10-CM

## 2020-02-11 NOTE — Patient Instructions (Signed)
Medication Instructions:  Your physician recommends that you continue on your current medications as directed. Please refer to the Current Medication list given to you today.  *If you need a refill on your cardiac medications before your next appointment, please call your pharmacy*   Lab Work: None   If you have labs (blood work) drawn today and your tests are completely normal, you will receive your results only by: Marland Kitchen MyChart Message (if you have MyChart) OR . A paper copy in the mail If you have any lab test that is abnormal or we need to change your treatment, we will call you to review the results.   Testing/Procedures: None   Follow-Up: At [facility], you and your health needs are our priority.  As part of our continuing mission to provide you with exceptional heart care, we have created designated Provider Care Teams.  These Care Teams include your primary Cardiologist (physician) and Advanced Practice Providers (APPs -  Physician Assistants and Nurse Practitioners) who all work together to provide you with the care you need, when you need it.  We recommend signing up for the patient portal called "MyChart".  Sign up information is provided on this After Visit Summary.  MyChart is used to connect with patients for Virtual Visits (Telemedicine).  Patients are able to view lab/test results, encounter notes, upcoming appointments, etc.  Non-urgent messages can be sent to your provider as well.   To learn more about what you can do with MyChart, go to [email or web address].    Your next appointment:   6 month(s)  The format for your next appointment:   In Person  Provider:   You may see Larae Grooms, MD or one of the following Advanced Practice Providers on your designated Care Team:    Melina Copa, PA-C  Ermalinda Barrios, PA-C    Other Instructions  High-Fiber Diet Fiber, also called dietary fiber, is a type of carbohydrate that is found in fruits, vegetables,  whole grains, and beans. A high-fiber diet can have many health benefits. Your health care provider may recommend a high-fiber diet to help:  Prevent constipation. Fiber can make your bowel movements more regular.  Lower your cholesterol.  Relieve the following conditions: ? Swelling of veins in the anus (hemorrhoids). ? Swelling and irritation (inflammation) of specific areas of the digestive tract (uncomplicated diverticulosis). ? A problem of the large intestine (colon) that sometimes causes pain and diarrhea (irritable bowel syndrome, IBS).  Prevent overeating as part of a weight-loss plan.  Prevent heart disease, type 2 diabetes, and certain cancers. What is my plan? The recommended daily fiber intake in grams (g) includes:  38 g for men age 47 or younger.  30 g for men over age 52.  53 g for women age 58 or younger.  21 g for women over age 74. You can get the recommended daily intake of dietary fiber by:  Eating a variety of fruits, vegetables, grains, and beans.  Taking a fiber supplement, if it is not possible to get enough fiber through your diet. What do I need to know about a high-fiber diet?  It is better to get fiber through food sources rather than from fiber supplements. There is not a lot of research about how effective supplements are.  Always check the fiber content on the nutrition facts label of any prepackaged food. Look for foods that contain 5 g of fiber or more per serving.  Talk with a diet and nutrition specialist (dietitian)  if you have questions about specific foods that are recommended or not recommended for your medical condition, especially if those foods are not listed below.  Gradually increase how much fiber you consume. If you increase your intake of dietary fiber too quickly, you may have bloating, cramping, or gas.  Drink plenty of water. Water helps you to digest fiber. What are tips for following this plan?  Eat a wide variety of  high-fiber foods.  Make sure that half of the grains that you eat each day are whole grains.  Eat breads and cereals that are made with whole-grain flour instead of refined flour or white flour.  Eat brown rice, bulgur wheat, or millet instead of white rice.  Start the day with a breakfast that is high in fiber, such as a cereal that contains 5 g of fiber or more per serving.  Use beans in place of meat in soups, salads, and pasta dishes.  Eat high-fiber snacks, such as berries, raw vegetables, nuts, and popcorn.  Choose whole fruits and vegetables instead of processed forms like juice or sauce. What foods can I eat?  Fruits Berries. Pears. Apples. Oranges. Avocado. Prunes and raisins. Dried figs. Vegetables Sweet potatoes. Spinach. Kale. Artichokes. Cabbage. Broccoli. Cauliflower. Green peas. Carrots. Squash. Grains Whole-grain breads. Multigrain cereal. Oats and oatmeal. Brown rice. Barley. Bulgur wheat. [place]. Quinoa. Bran muffins. Popcorn. Rye wafer crackers. Meats and other proteins Navy, kidney, and pinto beans. Soybeans. Split peas. Lentils. Nuts and seeds. Dairy Fiber-fortified yogurt. Beverages Fiber-fortified soy milk. Fiber-fortified orange juice. Other foods Fiber bars. The items listed above may not be a complete list of recommended foods and beverages. Contact a dietitian for more options. What foods are not recommended? Fruits Fruit juice. Cooked, strained fruit. Vegetables Fried potatoes. Canned vegetables. Well-cooked vegetables. Grains White bread. Pasta made with refined flour. White rice. Meats and other proteins Fatty cuts of meat. Fried chicken or fried fish. Dairy Milk. Yogurt. Cream cheese. Sour cream. Fats and oils Butters. Beverages Soft drinks. Other foods Cakes and pastries. The items listed above may not be a complete list of foods and beverages to avoid. Contact a dietitian for more information. Summary  Fiber is a type of  carbohydrate. It is found in fruits, vegetables, whole grains, and beans.  There are many health benefits of eating a high-fiber diet, such as preventing constipation, lowering blood cholesterol, helping with weight loss, and reducing your risk of heart disease, diabetes, and certain cancers.  Gradually increase your intake of fiber. Increasing too fast can result in cramping, bloating, and gas. Drink plenty of water while you increase your fiber.  The best sources of fiber include whole fruits and vegetables, whole grains, nuts, seeds, and beans. This information is not intended to replace advice given to you by your health care provider. Make sure you discuss any questions you have with your health care provider. Document Revised: 01/10/2017 Document Reviewed: 01/10/2017 Elsevier Patient Education  2020 [employer].

## 2020-03-04 DIAGNOSIS — U071 COVID-19: Secondary | ICD-10-CM | POA: Diagnosis not present

## 2020-03-04 DIAGNOSIS — Z20828 Contact with and (suspected) exposure to other viral communicable diseases: Secondary | ICD-10-CM | POA: Diagnosis not present

## 2020-03-07 DIAGNOSIS — U071 COVID-19: Secondary | ICD-10-CM | POA: Diagnosis not present

## 2020-03-07 DIAGNOSIS — Z20828 Contact with and (suspected) exposure to other viral communicable diseases: Secondary | ICD-10-CM | POA: Diagnosis not present

## 2020-03-08 ENCOUNTER — Other Ambulatory Visit: Payer: Self-pay | Admitting: Physician Assistant

## 2020-03-08 DIAGNOSIS — I5042 Chronic combined systolic (congestive) and diastolic (congestive) heart failure: Secondary | ICD-10-CM

## 2020-03-08 DIAGNOSIS — I10 Essential (primary) hypertension: Secondary | ICD-10-CM

## 2020-03-08 DIAGNOSIS — U071 COVID-19: Secondary | ICD-10-CM

## 2020-03-08 DIAGNOSIS — E6609 Other obesity due to excess calories: Secondary | ICD-10-CM

## 2020-03-08 DIAGNOSIS — J42 Unspecified chronic bronchitis: Secondary | ICD-10-CM

## 2020-03-08 NOTE — Progress Notes (Signed)
I connected by phone with Holly Smith on 03/08/2020 at 3:02 PM to discuss the potential use of a new treatment for mild to moderate COVID-19 viral infection in non-hospitalized patients.  This patient is a 74 y.o. female that meets the FDA criteria for Emergency Use Authorization of COVID monoclonal antibody casirivimab/imdevimab, bamlanivimab/etesevimab, or sotrovimab.  Has a (+) direct SARS-CoV-2 viral test result  Has mild or moderate COVID-19   Is NOT hospitalized due to COVID-19  Is within 10 days of symptom onset  Has at least one of the high risk factor(s) for progression to severe COVID-19 and/or hospitalization as defined in EUA.  Specific high risk criteria : Older age (>/= 76 yo), BMI > 25, Cardiovascular disease or hypertension and Chronic Lung Disease   I have spoken and communicated the following to the patient or parent/caregiver regarding COVID monoclonal antibody treatment:  1. FDA has authorized the emergency use for the treatment of mild to moderate COVID-19 in adults and pediatric patients with positive results of direct SARS-CoV-2 viral testing who are 39 years of age and older weighing at least 40 kg, and who are at high risk for progressing to severe COVID-19 and/or hospitalization.  2. The significant known and potential risks and benefits of COVID monoclonal antibody, and the extent to which such potential risks and benefits are unknown.  3. Information on available alternative treatments and the risks and benefits of those alternatives, including clinical trials.  4. Patients treated with COVID monoclonal antibody should continue to self-isolate and use infection control measures (e.g., wear mask, isolate, social distance, avoid sharing personal items, clean and disinfect "high touch" surfaces, and frequent handwashing) according to CDC guidelines.   5. The patient or parent/caregiver has the option to accept or refuse COVID monoclonal antibody  treatment.  After reviewing this information with the patient, the patient has agreed to receive one of the available covid 19 monoclonal antibodies and will be provided an appropriate fact sheet prior to infusion. Tami Lin Thimothy Barretta, [place] 03/08/2020 3:02 PM

## 2020-03-10 ENCOUNTER — Ambulatory Visit (HOSPITAL_COMMUNITY)
Admission: RE | Admit: 2020-03-10 | Discharge: 2020-03-10 | Disposition: A | Discharge status: Home / Self Care | Payer: Medicare Other | Source: Ambulatory Visit | Attending: Pulmonary Disease | Admitting: Pulmonary Disease

## 2020-03-10 DIAGNOSIS — I5042 Chronic combined systolic (congestive) and diastolic (congestive) heart failure: Secondary | ICD-10-CM | POA: Insufficient documentation

## 2020-03-10 DIAGNOSIS — U071 COVID-19: Secondary | ICD-10-CM | POA: Insufficient documentation

## 2020-03-10 DIAGNOSIS — I10 Essential (primary) hypertension: Secondary | ICD-10-CM | POA: Diagnosis present

## 2020-03-10 DIAGNOSIS — Z23 Encounter for immunization: Secondary | ICD-10-CM | POA: Insufficient documentation

## 2020-03-10 DIAGNOSIS — J42 Unspecified chronic bronchitis: Secondary | ICD-10-CM | POA: Diagnosis present

## 2020-03-10 DIAGNOSIS — E6609 Other obesity due to excess calories: Secondary | ICD-10-CM

## 2020-03-10 MED ORDER — ALBUTEROL SULFATE HFA 108 (90 BASE) MCG/ACT IN AERS: 2.0000 | INHALATION_SPRAY | Freq: Once | RESPIRATORY_TRACT | Status: DC | PRN

## 2020-03-10 MED ORDER — METHYLPREDNISOLONE SODIUM SUCC 125 MG IJ SOLR: 125.0000 mg | Freq: Once | INTRAMUSCULAR | Status: DC | PRN

## 2020-03-10 MED ORDER — EPINEPHRINE 0.3 MG/0.3ML IJ SOAJ: 0.3000 mg | Freq: Once | INTRAMUSCULAR | Status: DC | PRN

## 2020-03-10 MED ORDER — SODIUM CHLORIDE 0.9 % IV SOLN: INTRAVENOUS | Status: DC | PRN

## 2020-03-10 MED ORDER — SODIUM CHLORIDE 0.9 % IV SOLN: Freq: Once | INTRAVENOUS | Status: AC

## 2020-03-10 MED ORDER — DIPHENHYDRAMINE HCL 50 MG/ML IJ SOLN: 50.0000 mg | Freq: Once | INTRAMUSCULAR | Status: DC | PRN

## 2020-03-10 MED ORDER — FAMOTIDINE IN NACL 20-0.9 MG/50ML-% IV SOLN: 20.0000 mg | Freq: Once | INTRAVENOUS | Status: DC | PRN

## 2020-03-10 NOTE — Progress Notes (Signed)
  Diagnosis: COVID-19  Physician: Dr. Asencion Noble  Procedure: [facility] Infusion Clinic Med: bamlanivimab\etesevimab infusion - Provided patient with bamlanimivab\etesevimab fact sheet for patients, parents and caregivers prior to infusion.  Complications: No immediate complications noted.  Discharge: Discharged home   Paul Dykes 03/10/2020

## 2020-03-10 NOTE — Discharge Instructions (Signed)
10 Things You Can Do to Manage Your COVID-19 Symptoms at Home If you have possible or confirmed COVID-19: 1. Stay home from work and school. And stay away from other public places. If you must go out, avoid using any kind of public transportation, ridesharing, or taxis. 2. Monitor your symptoms carefully. If your symptoms get worse, call your healthcare provider immediately. 3. Get rest and stay hydrated. 4. If you have a medical appointment, call the healthcare provider ahead of time and tell them that you have or may have COVID-19. 5. For medical emergencies, call 911 and notify the dispatch personnel that you have or may have COVID-19. 6. Cover your cough and sneezes with a tissue or use the inside of your elbow. 7. Wash your hands often with soap and water for at least 20 seconds or clean your hands with an alcohol-based hand sanitizer that contains at least 60% alcohol. 8. As much as possible, stay in a specific room and away from other people in your home. Also, you should use a separate bathroom, if available. If you need to be around other people in or outside of the home, wear a mask. 9. Avoid sharing personal items with other people in your household, like dishes, towels, and bedding. 10. Clean all surfaces that are touched often, like counters, tabletops, and doorknobs. Use household cleaning sprays or wipes according to the label instructions. cdc.gov/coronavirus 09/20/2018 This information is not intended to replace advice given to you by your health care provider. Make sure you discuss any questions you have with your health care provider. Document Revised: 02/22/2019 Document Reviewed: 02/22/2019 Elsevier Patient Education  2020 Elsevier Inc. What types of side effects do monoclonal antibody drugs cause?  Common side effects  In general, the more common side effects caused by monoclonal antibody drugs include: . Allergic reactions, such as hives or itching . Flu-like signs and  symptoms, including chills, fatigue, fever, and muscle aches and pains . Nausea, vomiting . Diarrhea . Skin rashes . Low blood pressure   The CDC is recommending patients who receive monoclonal antibody treatments wait at least 90 days before being vaccinated.  Currently, there are no data on the safety and efficacy of mRNA COVID-19 vaccines in persons who received monoclonal antibodies or convalescent plasma as part of COVID-19 treatment. Based on the estimated half-life of such therapies as well as evidence suggesting that reinfection is uncommon in the 90 days after initial infection, vaccination should be deferred for at least 90 days, as a precautionary measure until additional information becomes available, to avoid interference of the antibody treatment with vaccine-induced immune responses. If you have any questions or concerns after the infusion please call the Advanced Practice Provider on call at [phone]. This number is ONLY intended for your use regarding questions or concerns about the infusion post-treatment side-effects.  Please do not provide this number to others for use. For return to work notes please contact your primary care provider.   If someone you know is interested in receiving treatment please have them call the COVID hotline at [phone].   

## 2020-03-10 NOTE — Progress Notes (Signed)
Patient reviewed Fact Sheet for Patients, Parents, and Caregivers for Emergency Use Authorization (EUA) of bamlanivimab and etesevimab for the Treatment of Coronavirus. Patient also reviewed and is agreeable to the estimated cost of treatment. Patient is agreeable to proceed.   

## 2020-03-17 DIAGNOSIS — Z87891 Personal history of nicotine dependence: Secondary | ICD-10-CM | POA: Diagnosis not present

## 2020-03-17 DIAGNOSIS — Z7984 Long term (current) use of oral hypoglycemic drugs: Secondary | ICD-10-CM | POA: Diagnosis not present

## 2020-03-17 DIAGNOSIS — I110 Hypertensive heart disease with heart failure: Secondary | ICD-10-CM | POA: Diagnosis not present

## 2020-03-17 DIAGNOSIS — R42 Dizziness and giddiness: Secondary | ICD-10-CM | POA: Diagnosis not present

## 2020-03-17 DIAGNOSIS — Z881 Allergy status to other antibiotic agents status: Secondary | ICD-10-CM | POA: Diagnosis not present

## 2020-03-17 DIAGNOSIS — J449 Chronic obstructive pulmonary disease, unspecified: Secondary | ICD-10-CM | POA: Diagnosis not present

## 2020-03-17 DIAGNOSIS — R0602 Shortness of breath: Secondary | ICD-10-CM | POA: Diagnosis not present

## 2020-03-17 DIAGNOSIS — N3000 Acute cystitis without hematuria: Secondary | ICD-10-CM | POA: Diagnosis not present

## 2020-03-17 DIAGNOSIS — I4819 Other persistent atrial fibrillation: Secondary | ICD-10-CM | POA: Diagnosis not present

## 2020-03-17 DIAGNOSIS — Z79899 Other long term (current) drug therapy: Secondary | ICD-10-CM | POA: Diagnosis not present

## 2020-03-17 DIAGNOSIS — R0981 Nasal congestion: Secondary | ICD-10-CM | POA: Diagnosis not present

## 2020-03-17 DIAGNOSIS — I4811 Longstanding persistent atrial fibrillation: Secondary | ICD-10-CM | POA: Diagnosis not present

## 2020-03-17 DIAGNOSIS — I6523 Occlusion and stenosis of bilateral carotid arteries: Secondary | ICD-10-CM | POA: Diagnosis not present

## 2020-03-17 DIAGNOSIS — I509 Heart failure, unspecified: Secondary | ICD-10-CM | POA: Diagnosis not present

## 2020-03-25 NOTE — Progress Notes (Unsigned)
Cardiology Office Note   Date:  03/26/2020   ID:  Holly, Smith Aug 27, 1945, MRN [id number]  PCP:  Imagene Riches, NP    No chief complaint on file.  CAD  Wt Readings from Last 3 Encounters:  03/26/20 186 lb (84.4 kg)  02/11/20 192 lb 12.8 oz (87.5 kg)  07/30/19 218 lb 12.8 oz (99.2 kg)       History of Present Illness: Holly Smith is a 75 y.o. female  Who haschronic atrial fibrillation that is now rate controlledand h/otachycardia mediated cardiomyopathy in 2015. She was cardioverted at [facility].EF later improved but she has chronic diastolic dysfunction. Last echo was in 2016. EF was 55-60%. Mild MR noted. She is on Xarelto for a/c.She went back into atrial fibrillation several years ago which is now chronic. Previously seen by Dr. Rayann Heman and not felt to be a good candidate for ablation. She has failed propafenoneand amiodarone in the past. She is on metoprolol for rate control. No h/o CAD. Other medical problems include HTN, HLD and obesity.  In 2019: "We discussed weight loss surgery referral. She had a friend who died many years ago during weight loss surgery so she is hesitant. She prefers the Atkins diet to try to lose weight. She has done well with this in the past."  She developed symptoms concerning for coronary artery disease and underwent cardiac catheterization inOctober 2020 showing:  " Prox Cx lesion is 50% stenosed.  More distal mid LAD-1 lesion is 80% stenosed.  A drug-eluting stent was successfully placed using a STENT SYNERGY DES 2.25X28.  Post intervention, there is a 0% residual stenosis.  More proximal mid LAD-2 lesion is 80% stenosed.  A drug-eluting stent was successfully placed using a STENT SYNERGY DES 2.75X20 postdilated to 3.0 mm.  Post intervention, there is a 0% residual stenosis.  Mid RCA lesion is 25% stenosed.  Acute Mrg lesion is 75% stenosed. This is a small vessel.  RPDA lesion is 70% stenosed. This  is a small vessel.  The left ventricular systolic function is normal.  LV end diastolic pressure is moderately elevated. LVEDP 26 mm Hg.  The left ventricular ejection fraction is 55-65% by visual estimate.  There is no aortic valve stenosis.  Restart Xarelto tomorrow. Continue Plavix 75 mg daily in addition to Xarelto. To reduce bleeding risk, will hold off on continuing aspirin."  After the catheterization, she lost weight.  At the 5/21 visit:, "she had stopped her cholesterol medicine due to cramps in her hands and feet.  She also reports that she falls asleep easily during the day when she is not trying to.  She is not interested in the Covid vaccine."  Crestor was added back 3x/week.  She is taking it 1-2x/week.  She was diagnosed with DM.  Metformin was started.  Also added [place].  She developed COVID in mid December and was treated with monoclonal antibody.  She was unvaccinated.  Dizziness prompted ER visit on 03/17/20.  Head and neck CT showed: "No hemodynamically significant stenosis, large vessel cut or aneurysms in intracranial circulation 2. No hemodynamically significant stenosis, dissection or aneurysms in extracranial circulation. 3. Scattered patchy opacities in visualized upper lungs may represent infiltrates. Please correlate with signs and symptoms. 4. Incidental note is made of soft tissue prominence at the base of tongue that is abutting the anterior aspect of epiglottis (image 247 series 7). Differential diagnoses include enlarged lingual tonsils or possibly base of tongue mass. Further workup/follow-up and  direct visualization can be considered."  AFib was stable.   Given Bactrim for cystitis and meclizine for dizziness.   Still having some dizziness since her COVID infection. She stopped taking her taking diuretic, but her granddaughter made her restart.  She has continued to lose weight.  Her appetite has not been very good since her Covid infection.  She  does try to drink plenty of water.  She has not seen any ankle swelling or felt any of the volume overload symptoms she has had in the past.  Of note, her creatinine was mildly elevated compared to her baseline at the end of December when she was seen at [facility].  She thinks she may have an inner ear infection.    Past Medical History:  Diagnosis Date  . A-fib (HCC)   . Abnormal Pap smear of vagina and vaginal HPV   . Acute diastolic CHF (congestive heart failure) (HCC) 01/21/2015  . Acute diverticulitis 01/28/2015  . Acute on chronic diastolic heart failure (HCC) 01/21/2015  . Anticoagulation adequate 08/02/2014   Overview:  Xarelto 20hs; CHADS=1+ htn, female, age  Overview:  Xarelto 20hs; CHADS=1+ htn, female, age  . Arthritis    "bones ache" (01/23/2015)  . Atrial fibrillation, persistent (HCC) 08/13/2015   Overview:  symptomatic (fatigue, DOE, decreased exercise tolerance, palpitations), drug refractory (propafenone, amiodarone), complicated recovery from DCCV possibly related to repiratory obstruction [facility] (patient fear of DCCV)  Overview:  symptomatic (fatigue, DOE, decreased exercise tolerance, palpitations), drug refractory (propafenone, amiodarone), complicated recovery from DCCV possibly relat  . Benign neoplasm of ascending colon   . Benign neoplasm of descending colon   . Benign neoplasm of sigmoid colon   . Blood in stool 01/21/2015  . Breath shortness 08/20/2013  . CAFL (chronic airflow limitation) (HCC) 12/16/2014  . Cancer (HCC)    vulvar cancer  . CCF (congestive cardiac failure) (HCC) 08/03/2013   Overview:  ACUTE SYSTOLIC   . Chest pain 08/20/2013  . CHF (congestive heart failure) (HCC)   . CHF (congestive heart failure) (HCC)   . Chronic anticoagulation    She is on Xarelto for Afib  . Chronic atrial fibrillation (HCC)   . Chronic combined systolic and diastolic CHF, NYHA class 3 (HCC) 01/28/2015  . Chronic diastolic heart failure (HCC) 05/05/2015  . Condyloma acuminatum of  vulva 08/02/2014  . COPD (chronic obstructive pulmonary disease) (HCC) 08/13/2015  . Diverticulitis large intestine w/o perforation or abscess w/bleeding 01/28/2015  . Diverticulitis of large intestine without perforation or abscess with bleeding   . Dysmetabolic syndrome 12/16/2014   Overview:  BG-166 06/15/2013; elevated BMI; central obesity   . Dyspepsia   . Dyspnea   . Echocardiogram abnormal    Mitral regurgitation, tricuspic regurgitation  . Essential hypertension 10/07/2014   Trial off acei 10/07/2014  Due to atypical sob (talking = walking)> no real change 11/18/2014    . Family history of adverse reaction to anesthesia    sister gets very nauseated and vomits  . Gastritis without bleeding   . GERD (gastroesophageal reflux disease)   . History of kidney stones   . History of stress test    Myoview 8/16: EF 42%, anteroseptal, inferoseptal, anterior, apical anterior and apical defect (likely represents breast attenuation versus scar), no ischemia; intermediate risk because of low EF  . HPV (human papilloma virus) anogenital infection    03/21/12 PAP + HR HPV  . HTN (hypertension) 12/16/2014  . Hypertension   . Hypertensive heart disease 05/12/2016  .  Hypertensive urgency 01/21/2015  . Insulin resistance   . Kidney stone   . Kidney stones   . Malignant neoplasm of vulva ([place]) 06/19/2014   Overview:  Ms SHARREN SILVERIO is a 75 yo who was referred by Dr Ouida Sills for a vulvar lesion. 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 The specimen had negative margins, but was within 2 mm of the cancer.    . Mitral regurgitation 04/23/2013   Overview:  "Moderate to severe" by TTE, "mild to mod" by TEE  Overview:  "Moderate to severe" by TTE, "mild to mod" by TEE  . Obesity   . OSA (obstructive sleep apnea) 11/27/2014   07/2013 RDI 17/h CPAP 8   . OSA on CPAP   . Other specified diseases of esophagus   . Postoperative  nausea and vomiting 1983   many years ago with hysterectomy  . PVC (premature ventricular contraction) 12/16/2014  . Sepsis ([place]) 12/13/2016  . TI (tricuspid incompetence) 04/23/2013   Overview:  "moderate" by TTE   . Urolithiasis   . Vertigo   . Vulvar cancer ([place]) 2016   invasive verrucous carcinoma of the vulva. History of genital warts and vulvar condyloma  . Vulvar cancer, carcinoma ([place]) 05/10/2014    Past Surgical History:  Procedure Laterality Date  . ABDOMINAL HYSTERECTOMY    . APPENDECTOMY    . BILATERAL SALPINGOOPHORECTOMY  2003   benign ovarian cancer  . CARDIOVERSION  2014-2015   "[place]"  . COLONOSCOPY WITH PROPOFOL N/A 06/10/2015   Procedure: COLONOSCOPY WITH PROPOFOL;  Surgeon: Lucilla Lame, MD;  Location: [facility] ENDOSCOPY;  Service: Endoscopy;  Laterality: N/A;  . CORONARY STENT INTERVENTION N/A 01/18/2019   Procedure: CORONARY STENT INTERVENTION;  Surgeon: Jettie Booze, MD;  Location: [place] [facility];  Service: Cardiovascular;  Laterality: N/A;  . CYSTOSCOPY W/ RETROGRADES Bilateral 10/19/2016   Procedure: CYSTOSCOPY WITH RETROGRADE PYELOGRAM;  Surgeon: Hollice Espy, MD;  Location: [facility] ORS;  Service: Urology;  Laterality: Bilateral;  . CYSTOSCOPY W/ URETERAL STENT PLACEMENT Left 12/29/2016   Procedure: CYSTOSCOPY WITH STENT REPLACEMENT;  Surgeon: Hollice Espy, MD;  Location: [facility] ORS;  Service: Urology;  Laterality: Left;  . CYSTOSCOPY WITH BIOPSY N/A 10/19/2016   Procedure: CYSTOSCOPY WITH BLADDER BIOPSY, VAGINAL WALL BIOPSY;  Surgeon: Hollice Espy, MD;  Location: [facility] ORS;  Service: Urology;  Laterality: N/A;  . CYSTOSCOPY WITH STENT PLACEMENT Left 12/14/2016   Procedure: CYSTOSCOPY WITH STENT PLACEMENT;  Surgeon: Hollice Espy, MD;  Location: [facility] ORS;  Service: Urology;  Laterality: Left;  . ESOPHAGOGASTRODUODENOSCOPY (EGD) WITH PROPOFOL N/A 10/05/2016   Surgeon: Lucilla Lame, MD;  Location: [facility] ENDOSCOPY;  Results: Barrett's Esophagus-  repeat in 3 years 09/2019  . LEFT HEART CATH AND CORONARY ANGIOGRAPHY N/A 01/18/2019   Procedure: LEFT HEART CATH AND CORONARY ANGIOGRAPHY;  Surgeon: Jettie Booze, MD;  Location: [place] [facility];  Service: Cardiovascular;  Laterality: N/A;  . LITHOTRIPSY    . STONE EXTRACTION WITH BASKET Left 12/29/2016   Procedure: STONE EXTRACTION WITH BASKET;  Surgeon: Hollice Espy, MD;  Location: [facility] ORS;  Service: Urology;  Laterality: Left;  . TONSILLECTOMY    . URETEROSCOPY Left 12/29/2016   Procedure: URETEROSCOPY;  Surgeon: Hollice Espy, MD;  Location: [facility] ORS;  Service: Urology;  Laterality: Left;  Marland Kitchen VULVECTOMY Right 05/10/2014   Excisional biopsy of the superior right labial majora mass; [place] PARTIAL  07/02/2014  Re-excision and sentinel node dissection at [facility].      Current Outpatient Medications  Medication Sig Dispense Refill  . acetaminophen (TYLENOL) 325 MG tablet Take 2 tablets (650 mg total) by mouth every 6 (six) hours as needed for mild pain (or Fever >/= 101).    . benzonatate (TESSALON PERLES) 100 MG capsule Take 1 capsule (100 mg total) by mouth 3 (three) times daily as needed. 20 capsule 0  . BIOTIN PO Take 1 tablet by mouth daily.     . clopidogrel (PLAVIX) 75 MG tablet TAKE 1 TABLET (75 MG TOTAL) BY MOUTH DAILY WITH BREAKFAST. 90 tablet 3  . fluticasone (FLONASE) 50 MCG/ACT nasal spray Place 2 sprays into both nostrils daily. 16 g 6  . irbesartan (AVAPRO) 75 MG tablet TAKE 1 TABLET BY MOUTH EVERY DAY 90 tablet 2  . meclizine (ANTIVERT) 25 MG tablet Take 25 mg by mouth in the morning and at bedtime. For 3 days    . metoprolol tartrate (LOPRESSOR) 100 MG tablet TAKE 1 AND 1/2 TABLETS (150 MG TOTAL) BY MOUTH 2 (TWO) TIMES DAILY. 270 tablet 1  . nitroGLYCERIN (NITROSTAT) 0.4 MG SL tablet Place 1 tablet (0.4 mg total) under the tongue every 5 (five) minutes as needed. 25 tablet 2  . pantoprazole (PROTONIX) 40 MG tablet TAKE 1 TABLET BY  MOUTH EVERY DAY 90 tablet 2  . Potassium Chloride ER 20 MEQ TBCR Take 1 tablet by mouth daily.    . rosuvastatin (CRESTOR) 10 MG tablet Take 1 tablet 3 times a week 12 tablet 11  . sulfamethoxazole-trimethoprim (BACTRIM DS) 800-160 MG tablet Take by mouth. Take one tablet by mouth 2 (two) times daily for 10 days.    Marland Kitchen torsemide (DEMADEX) 20 MG tablet Take 2 tablets (40 mg total) by mouth daily. Take 2 tablets by mouth once a day, you may take an extra tablet as needed in the afternoon 180 tablet 3  . vitamin B-12 (CYANOCOBALAMIN) 1000 MCG tablet Take 1,000 mcg by mouth every other day.     Alveda Reasons 20 MG TABS tablet TAKE 1 TABLET (20 MG TOTAL) BY MOUTH DAILY WITH SUPPER. 90 tablet 1  . metFORMIN (GLUCOPHAGE-XR) 500 MG 24 hr tablet Take 500 mg by mouth 2 (two) times daily.     Current Facility-Administered Medications  Medication Dose Route Frequency Provider Last Rate Last Admin  . albuterol (PROVENTIL) (2.5 MG/3ML) 0.083% nebulizer solution 2.5 mg  2.5 mg Nebulization Once Juline Patch, MD        Allergies:   Ciprofloxacin    Social History:  The patient  reports that she quit smoking about 7 years ago. Her smoking use included cigarettes. She has a 50.00 pack-year smoking history. She has never used smokeless tobacco. She reports that she does not drink alcohol and does not use drugs.   Family History:  The patient's family history includes Diabetes in her mother; Heart attack in her brother and father; Heart disease in her sister; Hypertension in her mother; Pancreatic cancer in her brother; Prostate cancer (age of onset: 40) in her brother; Stroke in her mother.    ROS:  Please see the history of present illness.   Otherwise, review of systems are positive fordizziness.   All other systems are reviewed and negative.    PHYSICAL EXAM: VS:  BP 110/62   Pulse 96   Ht 5' (1.524 m)   Wt 186 lb (84.4 kg)   SpO2 97%   BMI 36.33 kg/m  ,  BMI Body mass index is 36.33 kg/m. GEN: Well  nourished, well developed, in no acute distress  HEENT: normal  Neck: no JVD, carotid bruits, or masses Cardiac: irregularly irregular; no murmurs, rubs, or gallops,no edema  Respiratory:  clear to auscultation bilaterally, normal work of breathing GI: soft, nontender, nondistended, + BS, obese MS: no deformity or atrophy  Skin: warm and dry, no rash Neuro:  Strength and sensation are intact Psych: euthymic mood, full affect   EKG:   The ekg ordered today demonstrates AFib, rate controlled, PVC, no change from 11/21   Recent Labs: No results found for requested labs within last 8760 hours.   Lipid Panel    Component Value Date/Time   CHOL 109 02/05/2019 1315   CHOL 165 03/18/2016 1056   TRIG 146 02/05/2019 1315   HDL 37 (L) 02/05/2019 1315   HDL 42 03/18/2016 1056   CHOLHDL 2.9 02/05/2019 1315   VLDL 29 02/05/2019 1315   LDLCALC 43 02/05/2019 1315   LDLCALC 89 03/18/2016 1056     Other studies Reviewed: Additional studies/ records that were reviewed today with results demonstrating: 10/21 1755 BNP, 12/21 860 BNP.   ASSESSMENT AND PLAN:  1. CAD: No angina. Continue aggressive secondary prevention.  BNP was lower in 12/21 than 10/21.  History of diastolic heart failure.  No obvious volume overload at this time. 2. Hyperlipidemia: Continue rosuvastatin 3 times a week.  She is tolerating this dose. 3. Obesity: Losing weight.  4. HTN/dizziness: BMet today.  Cr was 1.26 on 12/27 which was higher than usual.  Change diuretic to torsemide 20 mg PO daily prn fluid overload sx. Hopefully, this will help prevent dehydration as a possible cause of lightheadedness. 5. AFib: Rate controlled. Continue current meds.  Xarelto for stroke prevention. 6. Anticoagulation: Hbg 14 on 12/21. 7. DM: Glucose 115 on 12/27.  Recheck today.   Current medicines are reviewed at length with the patient today.  The patient concerns regarding her medicines were addressed.  The following changes have  been made:  No change  Labs/ tests ordered today include:  No orders of the defined types were placed in this encounter.   Recommend 150 minutes/week of aerobic exercise Low fat, low carb, high fiber diet recommended  Disposition:   FU in 1 year   Signed, Larae Grooms, MD  03/26/2020 9:20 AM    [facility] [address], [place], [place]  [address] Phone: [phone]; Fax: [phone]

## 2020-03-26 ENCOUNTER — Ambulatory Visit: Payer: Medicare HMO | Admitting: Interventional Cardiology

## 2020-03-26 ENCOUNTER — Other Ambulatory Visit: Payer: Self-pay

## 2020-03-26 ENCOUNTER — Encounter: Payer: Self-pay | Admitting: Interventional Cardiology

## 2020-03-26 VITALS — BP 110/62 | HR 96 | Ht 60.0 in | Wt 186.0 lb

## 2020-03-26 DIAGNOSIS — Z7901 Long term (current) use of anticoagulants: Secondary | ICD-10-CM | POA: Diagnosis not present

## 2020-03-26 DIAGNOSIS — I10 Essential (primary) hypertension: Secondary | ICD-10-CM

## 2020-03-26 DIAGNOSIS — I25118 Atherosclerotic heart disease of native coronary artery with other forms of angina pectoris: Secondary | ICD-10-CM

## 2020-03-26 DIAGNOSIS — I4821 Permanent atrial fibrillation: Secondary | ICD-10-CM | POA: Diagnosis not present

## 2020-03-26 DIAGNOSIS — E782 Mixed hyperlipidemia: Secondary | ICD-10-CM | POA: Diagnosis not present

## 2020-03-26 DIAGNOSIS — E1165 Type 2 diabetes mellitus with hyperglycemia: Secondary | ICD-10-CM | POA: Diagnosis not present

## 2020-03-26 DIAGNOSIS — R8290 Unspecified abnormal findings in urine: Secondary | ICD-10-CM | POA: Diagnosis not present

## 2020-03-26 DIAGNOSIS — R42 Dizziness and giddiness: Secondary | ICD-10-CM | POA: Diagnosis not present

## 2020-03-26 MED ORDER — TORSEMIDE 20 MG PO TABS: 40.0000 mg | ORAL_TABLET | Freq: Every day | ORAL | 3 refills | Status: DC | PRN

## 2020-03-26 NOTE — Patient Instructions (Addendum)
Medication Instructions:  Your physician has recommended you make the following change in your medication:   CHANGE: Torsemide 20 mg tablet: Take 2 tablets daily AS NEEDED for swelling  *If you need a refill on your cardiac medications before your next appointment, please call your pharmacy*   Lab Work: TODAY: BMET, LIPIDS  If you have labs (blood work) drawn today and your tests are completely normal, you will receive your results only by: Marland Kitchen MyChart Message (if you have MyChart) OR . A paper copy in the mail If you have any lab test that is abnormal or we need to change your treatment, we will call you to review the results.   Testing/Procedures: None   Follow-Up: At [facility], you and your health needs are our priority.  As part of our continuing mission to provide you with exceptional heart care, we have created designated Provider Care Teams.  These Care Teams include your primary Cardiologist (physician) and Advanced Practice Providers (APPs -  Physician Assistants and Nurse Practitioners) who all work together to provide you with the care you need, when you need it.  We recommend signing up for the patient portal called "MyChart".  Sign up information is provided on this After Visit Summary.  MyChart is used to connect with patients for Virtual Visits (Telemedicine).  Patients are able to view lab/test results, encounter notes, upcoming appointments, etc.  Non-urgent messages can be sent to your provider as well.   To learn more about what you can do with MyChart, go to [email or web address].    Your next appointment:   May 2022 -  The format for your next appointment:   In Person  Provider:   You may see Lance Muss, MD or one of the following Advanced Practice Providers on your designated Care Team:    Ronie Spies, PA-C  Jacolyn Reedy, PA-C    Other Instructions None

## 2020-03-27 LAB — LIPID PANEL
Chol/HDL Ratio: 6.4 ratio — ABNORMAL HIGH (ref 0.0–4.4)
Cholesterol, Total: 206 mg/dL — ABNORMAL HIGH (ref 100–199)
HDL: 32 mg/dL — ABNORMAL LOW (ref >39)
LDL Chol Calc (NIH): 112 mg/dL — ABNORMAL HIGH (ref 0–99)
Triglycerides: 358 mg/dL — ABNORMAL HIGH (ref 0–149)
VLDL Cholesterol Cal: 62 mg/dL — ABNORMAL HIGH (ref 5–40)

## 2020-03-27 LAB — BASIC METABOLIC PANEL
BUN/Creatinine Ratio: 17 (ref 12–28)
BUN: 30 mg/dL — ABNORMAL HIGH (ref 8–27)
CO2: 20 mmol/L (ref 20–29)
Calcium: 10 mg/dL (ref 8.7–10.3)
Chloride: 99 mmol/L (ref 96–106)
Creatinine, Ser: 1.72 mg/dL — ABNORMAL HIGH (ref 0.57–1.00)
GFR calc Af Amer: 33 mL/min/{1.73_m2} — ABNORMAL LOW (ref >59)
GFR calc non Af Amer: 28 mL/min/{1.73_m2} — ABNORMAL LOW (ref >59)
Glucose: 152 mg/dL — ABNORMAL HIGH (ref 65–99)
Potassium: 4.9 mmol/L (ref 3.5–5.2)
Sodium: 138 mmol/L (ref 134–144)

## 2020-04-01 DIAGNOSIS — R110 Nausea: Secondary | ICD-10-CM | POA: Diagnosis not present

## 2020-04-01 DIAGNOSIS — R42 Dizziness and giddiness: Secondary | ICD-10-CM | POA: Diagnosis not present

## 2020-04-01 DIAGNOSIS — E1165 Type 2 diabetes mellitus with hyperglycemia: Secondary | ICD-10-CM | POA: Diagnosis not present

## 2020-04-01 DIAGNOSIS — N1831 Chronic kidney disease, stage 3a: Secondary | ICD-10-CM | POA: Diagnosis not present

## 2020-04-01 DIAGNOSIS — R609 Edema, unspecified: Secondary | ICD-10-CM | POA: Diagnosis not present

## 2020-04-10 DIAGNOSIS — R2681 Unsteadiness on feet: Secondary | ICD-10-CM | POA: Diagnosis not present

## 2020-04-10 DIAGNOSIS — R42 Dizziness and giddiness: Secondary | ICD-10-CM | POA: Diagnosis not present

## 2020-04-10 DIAGNOSIS — H8112 Benign paroxysmal vertigo, left ear: Secondary | ICD-10-CM | POA: Diagnosis not present

## 2020-04-16 DIAGNOSIS — H8112 Benign paroxysmal vertigo, left ear: Secondary | ICD-10-CM | POA: Diagnosis not present

## 2020-04-16 DIAGNOSIS — R42 Dizziness and giddiness: Secondary | ICD-10-CM | POA: Diagnosis not present

## 2020-04-16 DIAGNOSIS — R2681 Unsteadiness on feet: Secondary | ICD-10-CM | POA: Diagnosis not present

## 2020-04-22 DIAGNOSIS — E119 Type 2 diabetes mellitus without complications: Secondary | ICD-10-CM | POA: Diagnosis not present

## 2020-04-22 DIAGNOSIS — R82998 Other abnormal findings in urine: Secondary | ICD-10-CM | POA: Diagnosis not present

## 2020-04-22 DIAGNOSIS — E876 Hypokalemia: Secondary | ICD-10-CM | POA: Diagnosis not present

## 2020-04-22 DIAGNOSIS — I10 Essential (primary) hypertension: Secondary | ICD-10-CM | POA: Diagnosis not present

## 2020-04-22 DIAGNOSIS — N1831 Chronic kidney disease, stage 3a: Secondary | ICD-10-CM | POA: Diagnosis not present

## 2020-04-23 DIAGNOSIS — R2681 Unsteadiness on feet: Secondary | ICD-10-CM | POA: Diagnosis not present

## 2020-04-23 DIAGNOSIS — R42 Dizziness and giddiness: Secondary | ICD-10-CM | POA: Diagnosis not present

## 2020-04-23 DIAGNOSIS — H8112 Benign paroxysmal vertigo, left ear: Secondary | ICD-10-CM | POA: Diagnosis not present

## 2020-04-29 DIAGNOSIS — E785 Hyperlipidemia, unspecified: Secondary | ICD-10-CM | POA: Diagnosis not present

## 2020-04-29 DIAGNOSIS — I25119 Atherosclerotic heart disease of native coronary artery with unspecified angina pectoris: Secondary | ICD-10-CM | POA: Diagnosis not present

## 2020-04-29 DIAGNOSIS — J449 Chronic obstructive pulmonary disease, unspecified: Secondary | ICD-10-CM | POA: Diagnosis not present

## 2020-04-29 DIAGNOSIS — I509 Heart failure, unspecified: Secondary | ICD-10-CM | POA: Diagnosis not present

## 2020-04-29 DIAGNOSIS — I4891 Unspecified atrial fibrillation: Secondary | ICD-10-CM | POA: Diagnosis not present

## 2020-04-29 DIAGNOSIS — E1165 Type 2 diabetes mellitus with hyperglycemia: Secondary | ICD-10-CM | POA: Diagnosis not present

## 2020-04-29 DIAGNOSIS — K219 Gastro-esophageal reflux disease without esophagitis: Secondary | ICD-10-CM | POA: Diagnosis not present

## 2020-04-29 DIAGNOSIS — D6869 Other thrombophilia: Secondary | ICD-10-CM | POA: Diagnosis not present

## 2020-04-29 DIAGNOSIS — J309 Allergic rhinitis, unspecified: Secondary | ICD-10-CM | POA: Diagnosis not present

## 2020-04-29 DIAGNOSIS — I110 Hypertensive heart disease with heart failure: Secondary | ICD-10-CM | POA: Diagnosis not present

## 2020-04-30 DIAGNOSIS — H8112 Benign paroxysmal vertigo, left ear: Secondary | ICD-10-CM | POA: Diagnosis not present

## 2020-04-30 DIAGNOSIS — R42 Dizziness and giddiness: Secondary | ICD-10-CM | POA: Diagnosis not present

## 2020-04-30 DIAGNOSIS — R2681 Unsteadiness on feet: Secondary | ICD-10-CM | POA: Diagnosis not present

## 2020-05-01 DIAGNOSIS — E785 Hyperlipidemia, unspecified: Secondary | ICD-10-CM | POA: Diagnosis not present

## 2020-05-01 DIAGNOSIS — R1030 Lower abdominal pain, unspecified: Secondary | ICD-10-CM | POA: Diagnosis not present

## 2020-05-01 DIAGNOSIS — E875 Hyperkalemia: Secondary | ICD-10-CM | POA: Diagnosis not present

## 2020-05-02 ENCOUNTER — Other Ambulatory Visit: Payer: Self-pay

## 2020-05-02 MED ORDER — METOPROLOL TARTRATE 100 MG PO TABS: ORAL_TABLET | ORAL | 3 refills | Status: DC

## 2020-05-06 ENCOUNTER — Other Ambulatory Visit: Payer: Self-pay

## 2020-05-06 DIAGNOSIS — E782 Mixed hyperlipidemia: Secondary | ICD-10-CM

## 2020-05-06 MED ORDER — METOPROLOL TARTRATE 100 MG PO TABS: ORAL_TABLET | ORAL | 3 refills | Status: DC

## 2020-05-08 DIAGNOSIS — H8112 Benign paroxysmal vertigo, left ear: Secondary | ICD-10-CM | POA: Diagnosis not present

## 2020-05-08 DIAGNOSIS — R2681 Unsteadiness on feet: Secondary | ICD-10-CM | POA: Diagnosis not present

## 2020-05-08 DIAGNOSIS — R42 Dizziness and giddiness: Secondary | ICD-10-CM | POA: Diagnosis not present

## 2020-05-14 DIAGNOSIS — R2681 Unsteadiness on feet: Secondary | ICD-10-CM | POA: Diagnosis not present

## 2020-05-14 DIAGNOSIS — H8112 Benign paroxysmal vertigo, left ear: Secondary | ICD-10-CM | POA: Diagnosis not present

## 2020-05-14 DIAGNOSIS — R42 Dizziness and giddiness: Secondary | ICD-10-CM | POA: Diagnosis not present

## 2020-05-18 ENCOUNTER — Other Ambulatory Visit: Payer: Self-pay | Admitting: Interventional Cardiology

## 2020-05-19 ENCOUNTER — Ambulatory Visit: Payer: Medicare HMO | Admitting: Pharmacist

## 2020-05-19 NOTE — Telephone Encounter (Signed)
Prescription refill request for Xarelto received.  Indication: afib Last office visit: [place], 03/26/2020 Weight: 84.4 kg  Age: 75 yo  Scr: 1.72, 03/26/2020 CrCl: 37 ml/min   Pt is suppose to have a visit with the pharm D today. Pt will also get labs. With labs from 03/26/2020 pt would qualify for a dose decrease.

## 2020-05-19 NOTE — Progress Notes (Deleted)
Patient ID: Holly Smith                 DOB: 02-13-1946                    MRN: [id number]     HPI: Holly Smith is a 75 y.o. female patient referred to lipid clinic by Dr Irish Lack. PMH is significant for CAD s/p DES to distal and proximal mid LAD 12/2018, chronic afib, diastolic CHF with LVEF 36-14% by 12/2018 cath, DM2, HTN, HLD, obesity, former tobacco abuse, and COVID infection 02/2020. She was unvaccinated and was treated with monoclonal antibodies. Went to the ER 03/17/20 for dizziness and was given meclizine. Diuretic was changed to torsemide prn fluid overload to help prevent dehydration as possible cause of lightheadedness.  Cramps in hands and feet on cholesterol meds - only see rosuva 20, any other meds?  aetna Part D Praluent Tier 3 (Repatha NF), Nexlizet (NF), Vascepa A1c recently? TG 358, LDL 112  Repeat BMET - Xarelto dosing. SCr was 1.72 on 03/26/20. 1.26 at [facility] on 03/17/20 and 0.84 on 12/26/19 at [facility]  Current Medications: rosuvastatin 10mg  3x weekly Intolerances: rosuvastatin 20mg  daily Risk Factors: CAD s/p DES, CHF, DM, HTN, obesity, former tobacco use LDL goal: 70mg /dL  Diet:   Exercise:   Family History: The patient's family history includes Diabetes in her mother; Heart attack in her brother and father; Heart disease in her sister; Hypertension in her mother; Pancreatic cancer in her brother; Prostate cancer (age of onset: 2) in her brother; Stroke in her mother.   Social History: The patient  reports that she quit smoking about 7 years ago. Her smoking use included cigarettes. She has a 50.00 pack-year smoking history. She has never used smokeless tobacco. She reports that she does not drink alcohol and does not use drugs.   Labs: 03/26/20: TC 206, TG 358, HDL 32, LDL 112 (rosuvastatin 10mg  3x weekly)  Past Medical History:  Diagnosis Date  . A-fib ([place])   . Abnormal Pap smear of vagina and vaginal HPV   . Acute diastolic CHF (congestive  heart failure) ([place]) 01/21/2015  . Acute diverticulitis 01/28/2015  . Acute on chronic diastolic heart failure ([place]) 01/21/2015  . Anticoagulation adequate 08/02/2014   Overview:  Xarelto 20hs; CHADS=1+ htn, female, age  Overview:  Xarelto 20hs; CHADS=1+ htn, female, age  . Arthritis    "bones ache" (01/23/2015)  . Atrial fibrillation, persistent ([place]) 08/13/2015   Overview:  symptomatic (fatigue, DOE, decreased exercise tolerance, palpitations), drug refractory (propafenone, amiodarone), complicated recovery from DCCV possibly related to repiratory obstruction [facility] (patient fear of DCCV)  Overview:  symptomatic (fatigue, DOE, decreased exercise tolerance, palpitations), drug refractory (propafenone, amiodarone), complicated recovery from DCCV possibly relat  . Benign neoplasm of ascending colon   . Benign neoplasm of descending colon   . Benign neoplasm of sigmoid colon   . Blood in stool 01/21/2015  . Breath shortness 08/20/2013  . CAFL (chronic airflow limitation) ([place]) 12/16/2014  . Cancer ([place])    vulvar cancer  . CCF (congestive cardiac failure) ([place]) 08/03/2013   Overview:  ACUTE SYSTOLIC   . Chest pain 08/20/2013  . CHF (congestive heart failure) ([place])   . CHF (congestive heart failure) ([place])   . Chronic anticoagulation    She is on Xarelto for Afib  . Chronic atrial fibrillation ([place])   . Chronic combined systolic and diastolic CHF, NYHA class 3 ([place]) 01/28/2015  . Chronic diastolic heart failure (  [place]) 05/05/2015  . Condyloma acuminatum of vulva 08/02/2014  . COPD (chronic obstructive pulmonary disease) ([place]) 08/13/2015  . Diverticulitis large intestine w/o perforation or abscess w/bleeding 01/28/2015  . Diverticulitis of large intestine without perforation or abscess with bleeding   . Dysmetabolic syndrome 9/37/9024   Overview:  BG-166 06/15/2013; elevated BMI; central obesity   . Dyspepsia   . Dyspnea   . Echocardiogram abnormal    Mitral regurgitation, tricuspic regurgitation  .  Essential hypertension 10/07/2014   Trial off acei 10/07/2014  Due to atypical sob (talking = walking)> no real change 11/18/2014    . Family history of adverse reaction to anesthesia    sister gets very nauseated and vomits  . Gastritis without bleeding   . GERD (gastroesophageal reflux disease)   . History of kidney stones   . History of stress test    Myoview 8/16: EF 42%, anteroseptal, inferoseptal, anterior, apical anterior and apical defect (likely represents breast attenuation versus scar), no ischemia; intermediate risk because of low EF  . HPV (human papilloma virus) anogenital infection    03/21/12 PAP + HR HPV  . HTN (hypertension) 12/16/2014  . Hypertension   . Hypertensive heart disease 05/12/2016  . Hypertensive urgency 01/21/2015  . Insulin resistance   . Kidney stone   . Kidney stones   . Malignant neoplasm of vulva ([place]) 06/19/2014   Overview:  Ms AMYRIAH BURAS is a 75 yo who was referred by Dr Ouida Sills for a vulvar lesion. 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 The specimen had negative margins, but was within 2 mm of the cancer.    . Mitral regurgitation 04/23/2013   Overview:  "Moderate to severe" by TTE, "mild to mod" by TEE  Overview:  "Moderate to severe" by TTE, "mild to mod" by TEE  . Obesity   . OSA (obstructive sleep apnea) 11/27/2014   07/2013 RDI 17/h CPAP 8   . OSA on CPAP   . Other specified diseases of esophagus   . Postoperative nausea and vomiting 1983   many years ago with hysterectomy  . PVC (premature ventricular contraction) 12/16/2014  . Sepsis ([place]) 12/13/2016  . TI (tricuspid incompetence) 04/23/2013   Overview:  "moderate" by TTE   . Urolithiasis   . Vertigo   . Vulvar cancer ([place]) 2016   invasive verrucous carcinoma of the vulva. History of genital warts and vulvar condyloma  . Vulvar cancer, carcinoma ([place]) 05/10/2014    Current Outpatient Medications on File  Prior to Visit  Medication Sig Dispense Refill  . acetaminophen (TYLENOL) 325 MG tablet Take 2 tablets (650 mg total) by mouth every 6 (six) hours as needed for mild pain (or Fever >/= 101).    . benzonatate (TESSALON PERLES) 100 MG capsule Take 1 capsule (100 mg total) by mouth 3 (three) times daily as needed. 20 capsule 0  . BIOTIN PO Take 1 tablet by mouth daily.     . clopidogrel (PLAVIX) 75 MG tablet TAKE 1 TABLET (75 MG TOTAL) BY MOUTH DAILY WITH BREAKFAST. 90 tablet 3  . fluticasone (FLONASE) 50 MCG/ACT nasal spray Place 2 sprays into both nostrils daily. 16 g 6  . irbesartan (AVAPRO) 75 MG tablet TAKE 1 TABLET BY MOUTH EVERY DAY 90 tablet 2  . meclizine (ANTIVERT) 25 MG tablet Take 25 mg by mouth in the morning and at bedtime. For 3 days    . metFORMIN (GLUCOPHAGE-XR)  500 MG 24 hr tablet Take 500 mg by mouth 2 (two) times daily.    . metoprolol tartrate (LOPRESSOR) 100 MG tablet TAKE 1 AND 1/2 TABLETS (150 MG TOTAL) BY MOUTH 2 (TWO) TIMES DAILY. 270 tablet 3  . nitroGLYCERIN (NITROSTAT) 0.4 MG SL tablet Place 1 tablet (0.4 mg total) under the tongue every 5 (five) minutes as needed. 25 tablet 2  . pantoprazole (PROTONIX) 40 MG tablet TAKE 1 TABLET BY MOUTH EVERY DAY 90 tablet 2  . Potassium Chloride ER 20 MEQ TBCR Take 1 tablet by mouth daily.    . rosuvastatin (CRESTOR) 10 MG tablet Take 1 tablet 3 times a week 12 tablet 11  . torsemide (DEMADEX) 20 MG tablet Take 2 tablets (40 mg total) by mouth daily as needed (swelling). 180 tablet 3  . vitamin B-12 (CYANOCOBALAMIN) 1000 MCG tablet Take 1,000 mcg by mouth every other day.     Alveda Reasons 20 MG TABS tablet TAKE 1 TABLET (20 MG TOTAL) BY MOUTH DAILY WITH SUPPER. 90 tablet 1  . [DISCONTINUED] potassium chloride SA (K-DUR,KLOR-CON) 20 MEQ tablet Take 20 mEq by mouth daily.      Current Facility-Administered Medications on File Prior to Visit  Medication Dose Route Frequency Provider Last Rate Last Admin  . albuterol (PROVENTIL) (2.5  MG/3ML) 0.083% nebulizer solution 2.5 mg  2.5 mg Nebulization Once Juline Patch, MD        Allergies  Allergen Reactions  . Ciprofloxacin Other (See Comments)    SOB    Assessment/Plan:  1. Hyperlipidemia -

## 2020-05-19 NOTE — Telephone Encounter (Signed)
Pt did not come to appointment today. Holly Smith D spoke to pt who stated pt is to come to visit on 3/8 and will get labs then. Pt has enough Xarelto to get her to her appointment.

## 2020-05-19 NOTE — Progress Notes (Deleted)
Patient ID: Holly Smith                 DOB: [birth year]                    MRN: [id number]     HPI: Holly Smith is a 75 y.o. female patient referred to lipid clinic by Dr Irish Lack. PMH is significant for CAD s/p DES to distal and proximal mid LAD 12/2018, chronic afib, diastolic CHF with LVEF 57-26% by 12/2018 cath, DM2, HTN, HLD, obesity, former tobacco abuse, and COVID infection 02/2020. She was unvaccinated and was treated with monoclonal antibodies. Went to the ER 03/17/20 for dizziness and was given meclizine. Diuretic was changed to torsemide prn fluid overload to help prevent dehydration as possible cause of lightheadedness.  Cramps in hands and feet on cholesterol meds - only see rosuva 20, any other meds?  aetna Part D Praluent Tier 3 (Repatha NF), Nexlizet (NF), Vascepa A1c recently? TG 358, LDL 112  Repeat BMET - Xarelto dosing. SCr was 1.72 on 03/26/20. 1.26 at [facility] on 03/17/20 and 0.84 on 12/26/19 at [facility]  Current Medications: rosuvastatin 10mg  3x weekly Intolerances: rosuvastatin 20mg  daily Risk Factors: CAD s/p DES, CHF, DM, HTN, obesity, former tobacco use LDL goal: 70mg /dL  Diet:   Exercise:   Family History: The patient's family history includes Diabetes in her mother; Heart attack in her brother and father; Heart disease in her sister; Hypertension in her mother; Pancreatic cancer in her brother; Prostate cancer (age of onset: 27) in her brother; Stroke in her mother.   Social History: The patient  reports that she quit smoking about 7 years ago. Her smoking use included cigarettes. She has a 50.00 pack-year smoking history. She has never used smokeless tobacco. She reports that she does not drink alcohol and does not use drugs.   Labs: 03/26/20: TC 206, TG 358, HDL 32, LDL 112 (rosuvastatin 10mg  3x weekly)  Past Medical History:  Diagnosis Date  . A-fib ([place])   . Abnormal Pap smear of vagina and vaginal HPV   . Acute diastolic CHF (congestive  heart failure) ([place]) 01/21/2015  . Acute diverticulitis 01/28/2015  . Acute on chronic diastolic heart failure ([place]) 01/21/2015  . Anticoagulation adequate 08/02/2014   Overview:  Xarelto 20hs; CHADS=1+ htn, female, age  Overview:  Xarelto 20hs; CHADS=1+ htn, female, age  . Arthritis    "bones ache" (01/23/2015)  . Atrial fibrillation, persistent ([place]) 08/13/2015   Overview:  symptomatic (fatigue, DOE, decreased exercise tolerance, palpitations), drug refractory (propafenone, amiodarone), complicated recovery from DCCV possibly related to repiratory obstruction [facility] (patient fear of DCCV)  Overview:  symptomatic (fatigue, DOE, decreased exercise tolerance, palpitations), drug refractory (propafenone, amiodarone), complicated recovery from DCCV possibly relat  . Benign neoplasm of ascending colon   . Benign neoplasm of descending colon   . Benign neoplasm of sigmoid colon   . Blood in stool 01/21/2015  . Breath shortness 08/20/2013  . CAFL (chronic airflow limitation) ([place]) 12/16/2014  . Cancer ([place])    vulvar cancer  . CCF (congestive cardiac failure) ([place]) 08/03/2013   Overview:  ACUTE SYSTOLIC   . Chest pain 08/20/2013  . CHF (congestive heart failure) ([place])   . CHF (congestive heart failure) ([place])   . Chronic anticoagulation    She is on Xarelto for Afib  . Chronic atrial fibrillation ([place])   . Chronic combined systolic and diastolic CHF, NYHA class 3 ([place]) 01/28/2015  . Chronic diastolic heart failure (  [place]) 05/05/2015  . Condyloma acuminatum of vulva 08/02/2014  . COPD (chronic obstructive pulmonary disease) ([place]) 08/13/2015  . Diverticulitis large intestine w/o perforation or abscess w/bleeding 01/28/2015  . Diverticulitis of large intestine without perforation or abscess with bleeding   . Dysmetabolic syndrome 1/60/1093   Overview:  BG-166 06/15/2013; elevated BMI; central obesity   . Dyspepsia   . Dyspnea   . Echocardiogram abnormal    Mitral regurgitation, tricuspic regurgitation  .  Essential hypertension 10/07/2014   Trial off acei 10/07/2014  Due to atypical sob (talking = walking)> no real change 11/18/2014    . Family history of adverse reaction to anesthesia    sister gets very nauseated and vomits  . Gastritis without bleeding   . GERD (gastroesophageal reflux disease)   . History of kidney stones   . History of stress test    Myoview 8/16: EF 42%, anteroseptal, inferoseptal, anterior, apical anterior and apical defect (likely represents breast attenuation versus scar), no ischemia; intermediate risk because of low EF  . HPV (human papilloma virus) anogenital infection    03/21/12 PAP + HR HPV  . HTN (hypertension) 12/16/2014  . Hypertension   . Hypertensive heart disease 05/12/2016  . Hypertensive urgency 01/21/2015  . Insulin resistance   . Kidney stone   . Kidney stones   . Malignant neoplasm of vulva ([place]) 06/19/2014   Overview:  Ms GABRIELLIA REMPEL is a 75 yo who was referred by Dr Ouida Sills for a vulvar lesion. 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 The specimen had negative margins, but was within 2 mm of the cancer.    . Mitral regurgitation 04/23/2013   Overview:  "Moderate to severe" by TTE, "mild to mod" by TEE  Overview:  "Moderate to severe" by TTE, "mild to mod" by TEE  . Obesity   . OSA (obstructive sleep apnea) 11/27/2014   07/2013 RDI 17/h CPAP 8   . OSA on CPAP   . Other specified diseases of esophagus   . Postoperative nausea and vomiting 1983   many years ago with hysterectomy  . PVC (premature ventricular contraction) 12/16/2014  . Sepsis ([place]) 12/13/2016  . TI (tricuspid incompetence) 04/23/2013   Overview:  "moderate" by TTE   . Urolithiasis   . Vertigo   . Vulvar cancer ([place]) 2016   invasive verrucous carcinoma of the vulva. History of genital warts and vulvar condyloma  . Vulvar cancer, carcinoma ([place]) 05/10/2014    Current Outpatient Medications on File  Prior to Visit  Medication Sig Dispense Refill  . acetaminophen (TYLENOL) 325 MG tablet Take 2 tablets (650 mg total) by mouth every 6 (six) hours as needed for mild pain (or Fever >/= 101).    . benzonatate (TESSALON PERLES) 100 MG capsule Take 1 capsule (100 mg total) by mouth 3 (three) times daily as needed. 20 capsule 0  . BIOTIN PO Take 1 tablet by mouth daily.     . clopidogrel (PLAVIX) 75 MG tablet TAKE 1 TABLET (75 MG TOTAL) BY MOUTH DAILY WITH BREAKFAST. 90 tablet 3  . fluticasone (FLONASE) 50 MCG/ACT nasal spray Place 2 sprays into both nostrils daily. 16 g 6  . irbesartan (AVAPRO) 75 MG tablet TAKE 1 TABLET BY MOUTH EVERY DAY 90 tablet 2  . meclizine (ANTIVERT) 25 MG tablet Take 25 mg by mouth in the morning and at bedtime. For 3 days    . metFORMIN (GLUCOPHAGE-XR)  500 MG 24 hr tablet Take 500 mg by mouth 2 (two) times daily.    . metoprolol tartrate (LOPRESSOR) 100 MG tablet TAKE 1 AND 1/2 TABLETS (150 MG TOTAL) BY MOUTH 2 (TWO) TIMES DAILY. 270 tablet 3  . nitroGLYCERIN (NITROSTAT) 0.4 MG SL tablet Place 1 tablet (0.4 mg total) under the tongue every 5 (five) minutes as needed. 25 tablet 2  . pantoprazole (PROTONIX) 40 MG tablet TAKE 1 TABLET BY MOUTH EVERY DAY 90 tablet 2  . Potassium Chloride ER 20 MEQ TBCR Take 1 tablet by mouth daily.    . rosuvastatin (CRESTOR) 10 MG tablet Take 1 tablet 3 times a week 12 tablet 11  . torsemide (DEMADEX) 20 MG tablet Take 2 tablets (40 mg total) by mouth daily as needed (swelling). 180 tablet 3  . vitamin B-12 (CYANOCOBALAMIN) 1000 MCG tablet Take 1,000 mcg by mouth every other day.     Alveda Reasons 20 MG TABS tablet TAKE 1 TABLET (20 MG TOTAL) BY MOUTH DAILY WITH SUPPER. 90 tablet 1  . [DISCONTINUED] potassium chloride SA (K-DUR,KLOR-CON) 20 MEQ tablet Take 20 mEq by mouth daily.      Current Facility-Administered Medications on File Prior to Visit  Medication Dose Route Frequency Provider Last Rate Last Admin  . albuterol (PROVENTIL) (2.5  MG/3ML) 0.083% nebulizer solution 2.5 mg  2.5 mg Nebulization Once Juline Patch, MD        Allergies  Allergen Reactions  . Ciprofloxacin Other (See Comments)    SOB    Assessment/Plan:  1. Hyperlipidemia -

## 2020-05-27 ENCOUNTER — Ambulatory Visit: Payer: Medicare HMO

## 2020-05-28 ENCOUNTER — Encounter: Payer: Self-pay | Admitting: General Practice

## 2020-05-28 DIAGNOSIS — E119 Type 2 diabetes mellitus without complications: Secondary | ICD-10-CM | POA: Diagnosis not present

## 2020-05-28 NOTE — Telephone Encounter (Signed)
Age: 75 yo  Weight: 84.4 kg  Scr: 1.10, 05/01/2020 CrCl: 57.62ml/min   Lab report scanned in, pt has Scr from 05/01/20. Per dosing criteria pt is on the correct dose of Xarelto per dosing criteria, prescription refill sent for Xarelto 20mg  daily.

## 2020-06-04 DIAGNOSIS — R2681 Unsteadiness on feet: Secondary | ICD-10-CM | POA: Diagnosis not present

## 2020-06-04 DIAGNOSIS — H8112 Benign paroxysmal vertigo, left ear: Secondary | ICD-10-CM | POA: Diagnosis not present

## 2020-06-04 DIAGNOSIS — R42 Dizziness and giddiness: Secondary | ICD-10-CM | POA: Diagnosis not present

## 2020-06-27 DIAGNOSIS — Z7189 Other specified counseling: Secondary | ICD-10-CM | POA: Diagnosis not present

## 2020-06-27 DIAGNOSIS — E118 Type 2 diabetes mellitus with unspecified complications: Secondary | ICD-10-CM | POA: Diagnosis not present

## 2020-06-27 DIAGNOSIS — M25561 Pain in right knee: Secondary | ICD-10-CM | POA: Diagnosis not present

## 2020-06-27 DIAGNOSIS — I10 Essential (primary) hypertension: Secondary | ICD-10-CM | POA: Diagnosis not present

## 2020-06-27 DIAGNOSIS — E1165 Type 2 diabetes mellitus with hyperglycemia: Secondary | ICD-10-CM | POA: Diagnosis not present

## 2020-06-27 DIAGNOSIS — E876 Hypokalemia: Secondary | ICD-10-CM | POA: Diagnosis not present

## 2020-06-27 DIAGNOSIS — N183 Chronic kidney disease, stage 3 unspecified: Secondary | ICD-10-CM | POA: Diagnosis not present

## 2020-06-30 ENCOUNTER — Other Ambulatory Visit: Payer: Self-pay | Admitting: Interventional Cardiology

## 2020-07-10 DIAGNOSIS — M25551 Pain in right hip: Secondary | ICD-10-CM | POA: Diagnosis not present

## 2020-07-10 DIAGNOSIS — M25561 Pain in right knee: Secondary | ICD-10-CM | POA: Diagnosis not present

## 2020-07-10 DIAGNOSIS — M5431 Sciatica, right side: Secondary | ICD-10-CM | POA: Diagnosis not present

## 2020-07-10 DIAGNOSIS — E1165 Type 2 diabetes mellitus with hyperglycemia: Secondary | ICD-10-CM | POA: Diagnosis not present

## 2020-07-28 DIAGNOSIS — M1711 Unilateral primary osteoarthritis, right knee: Secondary | ICD-10-CM | POA: Diagnosis not present

## 2020-07-31 DIAGNOSIS — M25551 Pain in right hip: Secondary | ICD-10-CM | POA: Diagnosis not present

## 2020-07-31 DIAGNOSIS — M5431 Sciatica, right side: Secondary | ICD-10-CM | POA: Diagnosis not present

## 2020-07-31 DIAGNOSIS — R531 Weakness: Secondary | ICD-10-CM | POA: Diagnosis not present

## 2020-07-31 DIAGNOSIS — R262 Difficulty in walking, not elsewhere classified: Secondary | ICD-10-CM | POA: Diagnosis not present

## 2020-08-05 DIAGNOSIS — M5431 Sciatica, right side: Secondary | ICD-10-CM | POA: Diagnosis not present

## 2020-08-05 DIAGNOSIS — R262 Difficulty in walking, not elsewhere classified: Secondary | ICD-10-CM | POA: Diagnosis not present

## 2020-08-05 DIAGNOSIS — M25551 Pain in right hip: Secondary | ICD-10-CM | POA: Diagnosis not present

## 2020-08-05 DIAGNOSIS — R531 Weakness: Secondary | ICD-10-CM | POA: Diagnosis not present

## 2020-08-07 DIAGNOSIS — R262 Difficulty in walking, not elsewhere classified: Secondary | ICD-10-CM | POA: Diagnosis not present

## 2020-08-07 DIAGNOSIS — M5431 Sciatica, right side: Secondary | ICD-10-CM | POA: Diagnosis not present

## 2020-08-07 DIAGNOSIS — M25551 Pain in right hip: Secondary | ICD-10-CM | POA: Diagnosis not present

## 2020-08-07 DIAGNOSIS — R531 Weakness: Secondary | ICD-10-CM | POA: Diagnosis not present

## 2020-08-20 DIAGNOSIS — R262 Difficulty in walking, not elsewhere classified: Secondary | ICD-10-CM | POA: Diagnosis not present

## 2020-08-20 DIAGNOSIS — M5431 Sciatica, right side: Secondary | ICD-10-CM | POA: Diagnosis not present

## 2020-08-20 DIAGNOSIS — R531 Weakness: Secondary | ICD-10-CM | POA: Diagnosis not present

## 2020-08-20 DIAGNOSIS — M25551 Pain in right hip: Secondary | ICD-10-CM | POA: Diagnosis not present

## 2020-08-28 DIAGNOSIS — E119 Type 2 diabetes mellitus without complications: Secondary | ICD-10-CM | POA: Diagnosis not present

## 2020-09-02 ENCOUNTER — Telehealth: Payer: Self-pay | Admitting: Gastroenterology

## 2020-09-02 NOTE — Telephone Encounter (Signed)
This patient saw me last year for Barrett's esophagus. Dont know anything about her uncontrolled diarrhea. We can send a c. Diff and GI panel and if very bad go to the ER.

## 2020-09-02 NOTE — Telephone Encounter (Signed)
Ria Comment Advertising account executive) is helping with [name],patient having severe Diarrhea (0-10 @ 8) uncontrolled and is belching w/ a sour odor. Please advise

## 2020-09-03 ENCOUNTER — Other Ambulatory Visit: Payer: Self-pay

## 2020-09-03 DIAGNOSIS — R197 Diarrhea, unspecified: Secondary | ICD-10-CM

## 2020-09-03 NOTE — Telephone Encounter (Signed)
Returned granddaughter, Lindsay's call regarding symptoms. Advised her of Dr. Dorothey Baseman recommendation. GI panel and C diff ordered.

## 2020-09-08 ENCOUNTER — Telehealth: Payer: Self-pay | Admitting: Gastroenterology

## 2020-09-08 NOTE — Telephone Encounter (Signed)
Gave granddaughter the instructions for collecting the stool sample

## 2020-09-08 NOTE — Telephone Encounter (Signed)
Please call granddaughter, Ria Comment, with directions for stoop sample.

## 2020-09-11 DIAGNOSIS — R197 Diarrhea, unspecified: Secondary | ICD-10-CM | POA: Diagnosis not present

## 2020-09-18 ENCOUNTER — Telehealth: Payer: Self-pay | Admitting: Gastroenterology

## 2020-09-18 NOTE — Telephone Encounter (Signed)
Please call grand daughter, Mendel Ryder, to talk about results.

## 2020-09-19 NOTE — Telephone Encounter (Signed)
Returned Linsey's call (granddaughter) and advised her of lab results. Will get cardiac/blood thinner clearance prior to scheduling colonoscopy.

## 2020-09-25 ENCOUNTER — Other Ambulatory Visit: Payer: Self-pay

## 2020-09-30 ENCOUNTER — Other Ambulatory Visit: Payer: Self-pay

## 2020-10-01 ENCOUNTER — Telehealth: Payer: Self-pay

## 2020-10-01 NOTE — Telephone Encounter (Signed)
Pt's daughter notified of lab results. Cardiac clearance/blood thinner clearance has been sent prior to scheduling.

## 2020-10-01 NOTE — Telephone Encounter (Signed)
-----   Message from Lucilla Lame, MD sent at 09/30/2020  5:07 PM EDT ----- The patient know that the stool study showed no sign of infection.  If she is still having uncontrolled diarrhea she should be set up for colonoscopy.

## 2020-10-02 DIAGNOSIS — M25561 Pain in right knee: Secondary | ICD-10-CM | POA: Diagnosis not present

## 2020-10-02 DIAGNOSIS — N183 Chronic kidney disease, stage 3 unspecified: Secondary | ICD-10-CM | POA: Diagnosis not present

## 2020-10-02 DIAGNOSIS — E118 Type 2 diabetes mellitus with unspecified complications: Secondary | ICD-10-CM | POA: Diagnosis not present

## 2020-10-02 DIAGNOSIS — Z7189 Other specified counseling: Secondary | ICD-10-CM | POA: Diagnosis not present

## 2020-10-02 DIAGNOSIS — E1165 Type 2 diabetes mellitus with hyperglycemia: Secondary | ICD-10-CM | POA: Diagnosis not present

## 2020-10-02 DIAGNOSIS — E876 Hypokalemia: Secondary | ICD-10-CM | POA: Diagnosis not present

## 2020-10-02 DIAGNOSIS — I10 Essential (primary) hypertension: Secondary | ICD-10-CM | POA: Diagnosis not present

## 2020-10-08 ENCOUNTER — Telehealth: Payer: Self-pay | Admitting: *Deleted

## 2020-10-08 DIAGNOSIS — I2510 Atherosclerotic heart disease of native coronary artery without angina pectoris: Secondary | ICD-10-CM | POA: Insufficient documentation

## 2020-10-08 DIAGNOSIS — E1165 Type 2 diabetes mellitus with hyperglycemia: Secondary | ICD-10-CM | POA: Insufficient documentation

## 2020-10-08 DIAGNOSIS — E119 Type 2 diabetes mellitus without complications: Secondary | ICD-10-CM | POA: Insufficient documentation

## 2020-10-08 NOTE — Addendum Note (Signed)
Addended by: Marrianne Mood D on: 10/08/2020 02:31 PM   Modules accepted: Orders

## 2020-10-08 NOTE — Telephone Encounter (Signed)
[facility] Pre-operative Risk Assessment    Patient Name: Holly Smith  DOB: 1945-05-10 MRN: [id number]  HEARTCARE STAFF:  - IMPORTANT!!!!!! Under Visit Info/Reason for Call, type in Other and utilize the format Clearance MM/DD/YY or Clearance TBD. Do not use dashes or single digits. - Please review there is not already an duplicate clearance open for this procedure. - If request is for dental extraction, please clarify the # of teeth to be extracted. - If the patient is currently at the dentist's office, call Pre-Op Callback Staff (MA/nurse) to input urgent request.  - If the patient is not currently in the dentist office, please route to the Pre-Op pool.  Request for surgical clearance: CLEARANCE REQUEST WAS NOT FAXED TO OUR OFFICE, BUT WAS UNDER THE LETTERS TAB IN THE PT'S CAHRT; I WILL SEND TO PRE OP POOL AND PHARM-D AS FYI AS DR. VARANASI HAS GIVEN RECOMMENDATIONS; WILL AWAIT TO SEE IF ANY FURTHER ADVICE BEFORE SENDING TO REQUESTING OFFICE . I HAVE COPIED IN THE ACTUAL REQUEST FROM THE REQUESTING OFFICE; SEE BELOW.   What type of surgery is being performed? COLONOSCOPY  When is this surgery scheduled? TBD  What type of clearance is required (medical clearance vs. Pharmacy clearance to hold med vs. Both)? BOTH  Are there any medications that need to be held prior to surgery and how long? PLAVIX AND XARELTO; PLEASE SEE BELOW THE RECOMMENDATIONS FROM DR. VARANASI;   Practice name and name of physician performing surgery? [facility]  What is the office phone number? [phone]   7.   What is the office fax number? [phone] ATTN: Glennie Isle, CMA  8.   Anesthesia type (None, local, MAC, general) ? GENERAL   Julaine Hua 10/08/2020, 8:58 AM  _________________________________________________________________   (provider comments below)                      FW: Clinical Received: Today Michae Kava, CMA  Michae Kava, CMA       Previous Messages   ----- Message -----  From: Jettie Booze, MD  Sent: 10/03/2020  10:35 PM EDT  To: Thompson Grayer, RN  Subject: FW: Clinical                                   OK to hold Plavix 5 days prior and hold Xarelto 2 days prior.   JV  ----- Message -----  From: Glennie Isle, CMA  Sent: 09/25/2020  10:05 AM EDT  To: Jettie Booze, MD  Subject: Clinical                                       Letter Reason for letter: Holly Smith, [place] on 09/25/2020     [facility] [address], [place], [place]  [address] Phone:  [phone]   Fax:  [phone]   09/25/2020   Patient Name: Holly Smith   DOB: 10-Feb-1946   Dear Dr. Irish Lack,   Ms. Pizzini needs to be scheduled for a colonoscopy in the near future, under GENERAL anesthesia.   Please FAX this note of Medical/blood thinner clearance to [phone], ATTENTION: Ginger Feldpausch, CMA   To advise if this patient will require an office visit or further medical work-up before clearance  can be given please call [phone] (main number) and leave a message with the triage nurse.   Thank you   [facility]     _____  Patient is cleared to have procedure and should stop taking               1. PLAVIX 75MG ________days prior AND               2. XARELTO 20MG ______days prior               and _____ restart.   _____  Patient is NOT cleared to have procedure     Additional notes: PT IS CURRENTLY ON BOTH MEDICATIONS   ____________________________________________________     _______________________________________     ________________ Physician Signature                                                    Date    [place], [phone]                          1

## 2020-10-08 NOTE — Telephone Encounter (Signed)
See separate phone note.

## 2020-10-08 NOTE — Telephone Encounter (Signed)
CHA2DS2-VASc Score = 7  This indicates a 11.2% annual risk of stroke. The patient's score is based upon: CHF History: Yes HTN History: Yes Diabetes History: Yes Stroke History: No Vascular Disease History: Yes Age Score: 2 Gender Score: 1   Pt already cleared by Dr Irish Lack to hold Xarelto for 2 days prior to procedure. Recommend she resume as soon as safely possible afterwards due to elevated CV risk off of anticoagulation.

## 2020-10-08 NOTE — Progress Notes (Signed)
Jettie Booze, MD  Thompson Grayer, RN OK to hold Plavix 5 days prior and hold Xarelto 2 days prior.   JV

## 2020-10-08 NOTE — Progress Notes (Addendum)
   Name: Holly Smith  DOB: 07/07/1945  MRN: [id number]   Primary Cardiologist: Larae Grooms, MD  Chart reviewed as part of pre-operative protocol coverage. Holly Smith has a history of chronic atrial fibrillation on Xarelto and current chronic diastolic dysfunction. Other PMH includes HTN, HLD, and obesity. Last echo 2016, EF 55-60%, mild MR. Last cath  in 12/2018 with PCI to the d-mLAD and p-mLAD. She was continued on Plavix 75mg  daily and Xarelto with ASA restart held.   Patient was contacted 10/08/2020 in reference to pre-operative risk assessment for pending surgery as outlined below.   Holly Smith was last seen on 03/26/20 by Dr. Irish Lack.  Since that day, Holly Smith has done well without any CP or SOB. She reports that she is able to do her household chores without difficulty. She cannot push a vacuum but attributes this to her knee pain / arthritis and deconditioning as she denies any CP or SOB with vacuum use.   She has history or DM2 not on insulin.  Last 03/26/20 Cr 1.72.  RCRI score calculated with estimated risk of MACE 6.6 %, placing her at moderate risk for her upcoming procedure.  Based on ACC/AHA guidelines, and given the pt is doing well without any new sx at this time, she is acceptable risk for the planned procedure without further cardiovascular testing.   The patient was advised that if she develops new symptoms prior to surgery to contact our office to arrange for a follow-up visit, and she verbalized understanding.  She is aware of the pharmacy recommendation to hold her Xarelto for 2 days prior to procedure with restart as soon as felt safe by GI.   She states that she is no longer on clopidogrel for clarification. Will update her medication list, as on review of documentation, I was able to verify that it was indeed discussed that she could discontinue Plavix given on Xarelto (and she reports she has not been taking this medication for some  time).   I will route this recommendation to the requesting party via Epic fax function and remove from pre-op pool. Please call with questions.  Arvil Chaco, PA-C 10/08/2020, 2:18 PM   Will continue to try pt.  Arvil Chaco, PA-C 10/08/2020, 9:07 AM

## 2020-10-17 ENCOUNTER — Other Ambulatory Visit: Payer: Self-pay | Admitting: Interventional Cardiology

## 2020-10-17 ENCOUNTER — Other Ambulatory Visit: Payer: Self-pay | Admitting: Cardiology

## 2020-10-17 ENCOUNTER — Telehealth: Payer: Self-pay | Admitting: Interventional Cardiology

## 2020-10-17 MED ORDER — METOPROLOL TARTRATE 100 MG PO TABS: ORAL_TABLET | ORAL | 2 refills | Status: DC

## 2020-10-17 NOTE — Telephone Encounter (Signed)
*  STAT* If patient is at the pharmacy, call can be transferred to refill team.   1. Which medications need to be refilled? (please list name of each medication and dose if known) metoprolol tartrate (LOPRESSOR) 100 MG tablet  2. Which pharmacy/location (including street and city if local pharmacy) is medication to be sent to? [id number] [place], [place] - [address]  3. Do they need a 30 day or 90 day supply? 90 day supply  Pt is out of the medication

## 2020-10-17 NOTE — Telephone Encounter (Signed)
Refill sent.

## 2020-10-30 NOTE — Telephone Encounter (Addendum)
   Patient:  Holly Smith November 08, 1945   I am coming on board pre-op box today and saw this patient's Letters under [facility] charts tab. Appears clearance has already been addressed by team as below.  Per Dr. Irish Lack, "OK to hold Plavix 5 days prior and hold Xarelto 2 days prior." It is not clear to me if this was routed back to GI team. I spoke with patient to verify this information made it back to her - she verifies she is doing well without any complaints so OK to proceed with procedure as outlined. Will route this bundled recommendation to requesting provider via Epic fax function. Please call with questions.  Addendum: will re-fax with pt identifier at the top.  Hanish Laraia PA-C

## 2020-11-03 DIAGNOSIS — M1711 Unilateral primary osteoarthritis, right knee: Secondary | ICD-10-CM | POA: Diagnosis not present

## 2020-11-26 ENCOUNTER — Other Ambulatory Visit: Payer: Self-pay | Admitting: Interventional Cardiology

## 2020-11-27 DIAGNOSIS — E119 Type 2 diabetes mellitus without complications: Secondary | ICD-10-CM | POA: Diagnosis not present

## 2020-11-27 NOTE — Telephone Encounter (Addendum)
Prescription refill request for Xarelto received.  Indication: Afib  Last office visit: 03/26/20 Holly Smith) Weight: 84.4kg Age: 75 Scr: 1.14 (06/27/20)  CrCl: 55.94  Appropriate dose and refill sent to requested pharmacy.

## 2020-12-19 ENCOUNTER — Other Ambulatory Visit: Payer: Self-pay | Admitting: Interventional Cardiology

## 2020-12-23 DIAGNOSIS — J9601 Acute respiratory failure with hypoxia: Secondary | ICD-10-CM | POA: Diagnosis not present

## 2020-12-23 DIAGNOSIS — R0602 Shortness of breath: Secondary | ICD-10-CM | POA: Diagnosis not present

## 2020-12-23 DIAGNOSIS — I4819 Other persistent atrial fibrillation: Secondary | ICD-10-CM | POA: Diagnosis not present

## 2020-12-23 DIAGNOSIS — R7989 Other specified abnormal findings of blood chemistry: Secondary | ICD-10-CM | POA: Diagnosis not present

## 2020-12-23 DIAGNOSIS — U071 COVID-19: Secondary | ICD-10-CM | POA: Diagnosis not present

## 2020-12-23 DIAGNOSIS — R9431 Abnormal electrocardiogram [ECG] [EKG]: Secondary | ICD-10-CM | POA: Diagnosis not present

## 2020-12-23 DIAGNOSIS — I482 Chronic atrial fibrillation, unspecified: Secondary | ICD-10-CM | POA: Diagnosis not present

## 2020-12-23 DIAGNOSIS — D6869 Other thrombophilia: Secondary | ICD-10-CM | POA: Diagnosis not present

## 2020-12-23 DIAGNOSIS — I5033 Acute on chronic diastolic (congestive) heart failure: Secondary | ICD-10-CM | POA: Diagnosis not present

## 2020-12-23 DIAGNOSIS — Z7901 Long term (current) use of anticoagulants: Secondary | ICD-10-CM | POA: Diagnosis not present

## 2020-12-23 DIAGNOSIS — K219 Gastro-esophageal reflux disease without esophagitis: Secondary | ICD-10-CM | POA: Diagnosis not present

## 2020-12-23 DIAGNOSIS — R918 Other nonspecific abnormal finding of lung field: Secondary | ICD-10-CM | POA: Diagnosis not present

## 2020-12-23 DIAGNOSIS — E119 Type 2 diabetes mellitus without complications: Secondary | ICD-10-CM | POA: Diagnosis not present

## 2020-12-23 DIAGNOSIS — I110 Hypertensive heart disease with heart failure: Secondary | ICD-10-CM | POA: Diagnosis not present

## 2020-12-23 DIAGNOSIS — R778 Other specified abnormalities of plasma proteins: Secondary | ICD-10-CM | POA: Diagnosis not present

## 2020-12-23 DIAGNOSIS — I2510 Atherosclerotic heart disease of native coronary artery without angina pectoris: Secondary | ICD-10-CM | POA: Diagnosis not present

## 2020-12-23 DIAGNOSIS — I5023 Acute on chronic systolic (congestive) heart failure: Secondary | ICD-10-CM | POA: Diagnosis not present

## 2020-12-23 DIAGNOSIS — E875 Hyperkalemia: Secondary | ICD-10-CM | POA: Diagnosis not present

## 2020-12-23 DIAGNOSIS — R0603 Acute respiratory distress: Secondary | ICD-10-CM | POA: Diagnosis not present

## 2020-12-23 DIAGNOSIS — J811 Chronic pulmonary edema: Secondary | ICD-10-CM | POA: Diagnosis not present

## 2020-12-24 DIAGNOSIS — Z7901 Long term (current) use of anticoagulants: Secondary | ICD-10-CM | POA: Diagnosis not present

## 2020-12-24 DIAGNOSIS — I2510 Atherosclerotic heart disease of native coronary artery without angina pectoris: Secondary | ICD-10-CM | POA: Diagnosis not present

## 2020-12-24 DIAGNOSIS — R0603 Acute respiratory distress: Secondary | ICD-10-CM | POA: Diagnosis not present

## 2020-12-24 DIAGNOSIS — I5023 Acute on chronic systolic (congestive) heart failure: Secondary | ICD-10-CM | POA: Diagnosis not present

## 2020-12-24 DIAGNOSIS — E119 Type 2 diabetes mellitus without complications: Secondary | ICD-10-CM | POA: Diagnosis not present

## 2020-12-24 DIAGNOSIS — R918 Other nonspecific abnormal finding of lung field: Secondary | ICD-10-CM | POA: Diagnosis not present

## 2020-12-24 DIAGNOSIS — I4819 Other persistent atrial fibrillation: Secondary | ICD-10-CM | POA: Diagnosis not present

## 2020-12-24 DIAGNOSIS — U071 COVID-19: Secondary | ICD-10-CM | POA: Diagnosis not present

## 2020-12-24 DIAGNOSIS — R7989 Other specified abnormal findings of blood chemistry: Secondary | ICD-10-CM | POA: Diagnosis not present

## 2020-12-24 DIAGNOSIS — J9601 Acute respiratory failure with hypoxia: Secondary | ICD-10-CM | POA: Diagnosis not present

## 2020-12-24 DIAGNOSIS — R778 Other specified abnormalities of plasma proteins: Secondary | ICD-10-CM | POA: Diagnosis not present

## 2020-12-24 DIAGNOSIS — D6869 Other thrombophilia: Secondary | ICD-10-CM | POA: Diagnosis not present

## 2020-12-25 DIAGNOSIS — U071 COVID-19: Secondary | ICD-10-CM | POA: Diagnosis not present

## 2020-12-25 DIAGNOSIS — E119 Type 2 diabetes mellitus without complications: Secondary | ICD-10-CM | POA: Diagnosis not present

## 2020-12-25 DIAGNOSIS — D6869 Other thrombophilia: Secondary | ICD-10-CM | POA: Diagnosis not present

## 2020-12-25 DIAGNOSIS — I5023 Acute on chronic systolic (congestive) heart failure: Secondary | ICD-10-CM | POA: Diagnosis not present

## 2020-12-25 DIAGNOSIS — J9601 Acute respiratory failure with hypoxia: Secondary | ICD-10-CM | POA: Diagnosis not present

## 2020-12-25 DIAGNOSIS — R7989 Other specified abnormal findings of blood chemistry: Secondary | ICD-10-CM | POA: Diagnosis not present

## 2020-12-25 DIAGNOSIS — Z7901 Long term (current) use of anticoagulants: Secondary | ICD-10-CM | POA: Diagnosis not present

## 2020-12-25 DIAGNOSIS — R778 Other specified abnormalities of plasma proteins: Secondary | ICD-10-CM | POA: Diagnosis not present

## 2020-12-25 DIAGNOSIS — I2510 Atherosclerotic heart disease of native coronary artery without angina pectoris: Secondary | ICD-10-CM | POA: Diagnosis not present

## 2020-12-25 DIAGNOSIS — I4819 Other persistent atrial fibrillation: Secondary | ICD-10-CM | POA: Diagnosis not present

## 2021-01-13 DIAGNOSIS — E876 Hypokalemia: Secondary | ICD-10-CM | POA: Diagnosis not present

## 2021-01-13 DIAGNOSIS — N1831 Chronic kidney disease, stage 3a: Secondary | ICD-10-CM | POA: Diagnosis not present

## 2021-01-13 DIAGNOSIS — E119 Type 2 diabetes mellitus without complications: Secondary | ICD-10-CM | POA: Diagnosis not present

## 2021-01-13 DIAGNOSIS — E118 Type 2 diabetes mellitus with unspecified complications: Secondary | ICD-10-CM | POA: Diagnosis not present

## 2021-01-13 DIAGNOSIS — I5020 Unspecified systolic (congestive) heart failure: Secondary | ICD-10-CM | POA: Diagnosis not present

## 2021-01-13 DIAGNOSIS — I10 Essential (primary) hypertension: Secondary | ICD-10-CM | POA: Diagnosis not present

## 2021-01-26 ENCOUNTER — Ambulatory Visit: Payer: Medicare HMO | Admitting: Cardiology

## 2021-02-04 DIAGNOSIS — M1711 Unilateral primary osteoarthritis, right knee: Secondary | ICD-10-CM | POA: Diagnosis not present

## 2021-02-10 DIAGNOSIS — R0602 Shortness of breath: Secondary | ICD-10-CM | POA: Diagnosis not present

## 2021-02-10 DIAGNOSIS — E1165 Type 2 diabetes mellitus with hyperglycemia: Secondary | ICD-10-CM | POA: Diagnosis not present

## 2021-02-10 DIAGNOSIS — R69 Illness, unspecified: Secondary | ICD-10-CM | POA: Diagnosis not present

## 2021-02-10 DIAGNOSIS — N183 Chronic kidney disease, stage 3 unspecified: Secondary | ICD-10-CM | POA: Diagnosis not present

## 2021-02-11 DIAGNOSIS — R0602 Shortness of breath: Secondary | ICD-10-CM | POA: Diagnosis not present

## 2021-02-11 DIAGNOSIS — I700 Atherosclerosis of aorta: Secondary | ICD-10-CM | POA: Diagnosis not present

## 2021-02-19 DIAGNOSIS — R350 Frequency of micturition: Secondary | ICD-10-CM | POA: Diagnosis not present

## 2021-02-19 DIAGNOSIS — D72829 Elevated white blood cell count, unspecified: Secondary | ICD-10-CM | POA: Diagnosis not present

## 2021-02-19 DIAGNOSIS — R69 Illness, unspecified: Secondary | ICD-10-CM | POA: Diagnosis not present

## 2021-02-24 DIAGNOSIS — R350 Frequency of micturition: Secondary | ICD-10-CM | POA: Diagnosis not present

## 2021-02-24 DIAGNOSIS — D72829 Elevated white blood cell count, unspecified: Secondary | ICD-10-CM | POA: Diagnosis not present

## 2021-02-26 DIAGNOSIS — E119 Type 2 diabetes mellitus without complications: Secondary | ICD-10-CM | POA: Diagnosis not present

## 2021-03-10 ENCOUNTER — Other Ambulatory Visit: Payer: Self-pay | Admitting: Interventional Cardiology

## 2021-03-10 DIAGNOSIS — R197 Diarrhea, unspecified: Secondary | ICD-10-CM | POA: Diagnosis not present

## 2021-03-10 DIAGNOSIS — R112 Nausea with vomiting, unspecified: Secondary | ICD-10-CM | POA: Diagnosis not present

## 2021-03-10 DIAGNOSIS — E1165 Type 2 diabetes mellitus with hyperglycemia: Secondary | ICD-10-CM | POA: Diagnosis not present

## 2021-03-10 DIAGNOSIS — Z1159 Encounter for screening for other viral diseases: Secondary | ICD-10-CM | POA: Diagnosis not present

## 2021-03-13 ENCOUNTER — Other Ambulatory Visit: Payer: Self-pay | Admitting: Interventional Cardiology

## 2021-03-17 NOTE — Progress Notes (Signed)
Cardiology Office Note   Date:  03/18/2021   ID:  Holly Smith, Holly Smith Nov 05, 1945, MRN [id number]  PCP:  Imagene Riches, NP    No chief complaint on file.  CAD  Wt Readings from Last 3 Encounters:  03/18/21 197 lb 6.4 oz (89.5 kg)  03/26/20 186 lb (84.4 kg)  02/11/20 192 lb 12.8 oz (87.5 kg)       History of Present Illness: Holly Smith is a 75 y.o. female  Who has chronic atrial fibrillation that is now rate controlled and h/o tachycardia mediated cardiomyopathy in 2015.  She was cardioverted at [facility]. EF later improved but she has chronic diastolic dysfunction. Last echo was in 2016. EF was 55-60%. Mild MR noted. She is on Xarelto for a/c.  She went back into atrial fibrillation several years ago which is now chronic.  Previously seen by Dr. Rayann Heman and not felt to be a good candidate for ablation.  She has failed propafenone  and amiodarone in the past.  She is on metoprolol for rate control. No h/o CAD. Other medical problems include HTN, HLD and obesity.   In 2019: "We discussed weight loss surgery referral.  She had a friend who died many years ago during weight loss surgery so she is hesitant.  She prefers the Atkins diet to try to lose weight.  She has done well with this in the past. "   She developed symptoms concerning for coronary artery disease and underwent cardiac catheterization in October 2020 showing: "  Prox Cx lesion is 50% stenosed. More distal mid LAD-1 lesion is 80% stenosed. A drug-eluting stent was successfully placed using a STENT SYNERGY DES 2.25X28. Post intervention, there is a 0% residual stenosis. More proximal mid LAD-2 lesion is 80% stenosed. A drug-eluting stent was successfully placed using a STENT SYNERGY DES 2.75X20 postdilated to 3.0 mm. Post intervention, there is a 0% residual stenosis. Mid RCA lesion is 25% stenosed. Acute Mrg lesion is 75% stenosed. This is a small vessel. RPDA lesion is 70% stenosed. This is a small  vessel. The left ventricular systolic function is normal. LV end diastolic pressure is moderately elevated. LVEDP 26 mm Hg. The left ventricular ejection fraction is 55-65% by visual estimate. There is no aortic valve stenosis.   Restart Xarelto tomorrow.  Continue Plavix 75 mg daily in addition to Xarelto.  To reduce bleeding risk, will hold off on continuing aspirin. "   After the catheterization, she lost weight.    At the 5/21 visit:, "she had stopped her cholesterol medicine due to cramps in her hands and feet.  She also reports that she falls asleep easily during the day when she is not trying to.  She is not interested in the Covid vaccine."  Crestor was added back 3x/week.  She is taking it 1-2x/week.  She was diagnosed with DM.  Metformin was started.  Also added [place].   She developed COVID in mid December and was treated with monoclonal antibody.  She was unvaccinated.   Dizziness prompted ER visit on 03/17/20.  Head and neck CT showed: "No hemodynamically significant stenosis, large vessel cut or aneurysms in intracranial circulation 2. No hemodynamically significant stenosis, dissection or aneurysms in extracranial circulation. 3. Scattered patchy opacities in visualized upper lungs may represent infiltrates. Please correlate with signs and symptoms. 4. Incidental note is made of soft tissue prominence at the base of tongue that is abutting the anterior aspect of epiglottis (image 247 series 7).  Differential diagnoses include enlarged lingual tonsils or possibly base of tongue mass. Further workup/follow-up and direct visualization can be considered."  AFib was stable.   Given Bactrim for cystitis and meclizine for dizziness.   Diuretic was reduced in January 2022 due to dizziness and to prevent dehydration.  She went back to the two pills after having volume overload.  SHe was hospitalized at [facility]. She tested positive for COVID a that time as well.     Past Medical  History:  Diagnosis Date   A-fib ([place])    Abnormal Pap smear of vagina and vaginal HPV    Acute diastolic CHF (congestive heart failure) ([place]) 01/21/2015   Acute diverticulitis 01/28/2015   Acute on chronic diastolic heart failure ([place]) 01/21/2015   Anticoagulation adequate 08/02/2014   Overview:  Xarelto 20hs; CHADS=1+ htn, female, age  Overview:  Xarelto 20hs; CHADS=1+ htn, female, age   Arthritis    "bones ache" (01/23/2015)   Atrial fibrillation, persistent ([place]) 08/13/2015   Overview:  symptomatic (fatigue, DOE, decreased exercise tolerance, palpitations), drug refractory (propafenone, amiodarone), complicated recovery from DCCV possibly related to repiratory obstruction [facility] (patient fear of DCCV)  Overview:  symptomatic (fatigue, DOE, decreased exercise tolerance, palpitations), drug refractory (propafenone, amiodarone), complicated recovery from DCCV possibly relat   Benign neoplasm of ascending colon    Benign neoplasm of descending colon    Benign neoplasm of sigmoid colon    Blood in stool 01/21/2015   Breath shortness 08/20/2013   CAFL (chronic airflow limitation) (HCC) 12/16/2014   Cancer (HCC)    vulvar cancer   CCF (congestive cardiac failure) ([place]) 08/03/2013   Overview:  ACUTE SYSTOLIC    Chest pain 03/28/107   CHF (congestive heart failure) (HCC)    CHF (congestive heart failure) (HCC)    Chronic anticoagulation    She is on Xarelto for Afib   Chronic atrial fibrillation (HCC)    Chronic combined systolic and diastolic CHF, NYHA class 3 ([place]) 01/28/2015   Chronic diastolic heart failure ([place]) 05/05/2015   Condyloma acuminatum of vulva 08/02/2014   COPD (chronic obstructive pulmonary disease) ([place]) 08/13/2015   Diverticulitis large intestine w/o perforation or abscess w/bleeding 01/28/2015   Diverticulitis of large intestine without perforation or abscess with bleeding    Dysmetabolic syndrome 06/12/5571   Overview:  BG-166 06/15/2013; elevated BMI; central obesity    Dyspepsia     Dyspnea    Echocardiogram abnormal    Mitral regurgitation, tricuspic regurgitation   Essential hypertension 10/07/2014   Trial off acei 10/07/2014  Due to atypical sob (talking = walking)> no real change 11/18/2014     Family history of adverse reaction to anesthesia    sister gets very nauseated and vomits   Gastritis without bleeding    GERD (gastroesophageal reflux disease)    History of kidney stones    History of stress test    Myoview 8/16: EF 42%, anteroseptal, inferoseptal, anterior, apical anterior and apical defect (likely represents breast attenuation versus scar), no ischemia; intermediate risk because of low EF   HPV (human papilloma virus) anogenital infection    03/21/12 PAP + HR HPV   HTN (hypertension) 12/16/2014   Hypertension    Hypertensive heart disease 05/12/2016   Hypertensive urgency 01/21/2015   Insulin resistance    Kidney stone    Kidney stones    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 The specimen had negative margins, but was within 2 mm of the cancer.     Mitral regurgitation 04/23/2013   Overview:  "Moderate to severe" by TTE, "mild to mod" by TEE  Overview:  "Moderate to severe" by TTE, "mild to mod" by TEE   Obesity    OSA (obstructive sleep apnea) 11/27/2014   07/2013 RDI 17/h CPAP 8    OSA on CPAP    Other specified diseases of esophagus    Postoperative nausea and vomiting 1983   many years ago with hysterectomy   PVC (premature ventricular contraction) 12/16/2014   Sepsis ([place]) 12/13/2016   TI (tricuspid incompetence) 04/23/2013   Overview:  "moderate" by TTE    Urolithiasis    Vertigo    Vulvar cancer ([place]) 2016   invasive verrucous carcinoma of the vulva. History of genital warts and vulvar condyloma   Vulvar cancer, carcinoma  ([place]) 05/10/2014    Past Surgical History:  Procedure Laterality Date   ABDOMINAL HYSTERECTOMY     APPENDECTOMY     BILATERAL SALPINGOOPHORECTOMY  2003   benign ovarian cancer   CARDIOVERSION  2014-2015   "[facility]"   COLONOSCOPY WITH PROPOFOL N/A 06/10/2015   Procedure: COLONOSCOPY WITH PROPOFOL;  Surgeon: Lucilla Lame, MD;  Location: [facility] ENDOSCOPY;  Service: Endoscopy;  Laterality: N/A;   CORONARY STENT INTERVENTION N/A 01/18/2019   Procedure: CORONARY STENT INTERVENTION;  Surgeon: Jettie Booze, MD;  Location: [facility];  Service: Cardiovascular;  Laterality: N/A;   CYSTOSCOPY W/ RETROGRADES Bilateral 10/19/2016   Procedure: CYSTOSCOPY WITH RETROGRADE PYELOGRAM;  Surgeon: Hollice Espy, MD;  Location: [facility] ORS;  Service: Urology;  Laterality: Bilateral;   CYSTOSCOPY W/ URETERAL STENT PLACEMENT Left 12/29/2016   Procedure: CYSTOSCOPY WITH STENT REPLACEMENT;  Surgeon: Hollice Espy, MD;  Location: [facility] ORS;  Service: Urology;  Laterality: Left;   CYSTOSCOPY WITH BIOPSY N/A 10/19/2016   Procedure: CYSTOSCOPY WITH BLADDER BIOPSY, VAGINAL WALL BIOPSY;  Surgeon: Hollice Espy, MD;  Location: [facility] ORS;  Service: Urology;  Laterality: N/A;   CYSTOSCOPY WITH STENT PLACEMENT Left 12/14/2016   Procedure: CYSTOSCOPY WITH STENT PLACEMENT;  Surgeon: Hollice Espy, MD;  Location: [facility] ORS;  Service: Urology;  Laterality: Left;   ESOPHAGOGASTRODUODENOSCOPY (EGD) WITH PROPOFOL N/A 10/05/2016   Surgeon: Lucilla Lame, MD;  Location: [facility] ENDOSCOPY;  Results: Barrett's Esophagus- repeat in 3 years 09/2019   LEFT HEART CATH AND CORONARY ANGIOGRAPHY N/A 01/18/2019   Procedure: LEFT HEART CATH AND CORONARY ANGIOGRAPHY;  Surgeon: Jettie Booze, MD;  Location: [facility];  Service: Cardiovascular;  Laterality: N/A;   LITHOTRIPSY     STONE EXTRACTION WITH BASKET Left 12/29/2016   Procedure: STONE EXTRACTION WITH BASKET;  Surgeon: Hollice Espy, MD;  Location: [facility]  ORS;  Service: Urology;  Laterality: Left;   TONSILLECTOMY     URETEROSCOPY Left 12/29/2016   Procedure: URETEROSCOPY;  Surgeon: Hollice Espy, MD;  Location: [facility] ORS;  Service: Urology;  Laterality: Left;   VULVECTOMY Right 05/10/2014   Excisional biopsy of the superior right labial majora mass; [place] PARTIAL  07/02/2014   Re-excision and sentinel node dissection at [facility].      Current Outpatient Medications  Medication Sig Dispense Refill   acetaminophen (TYLENOL) 325 MG tablet Take 2 tablets (650 mg total) by mouth every 6 (six) hours as needed for mild pain (or  Fever >/= 101).     benzonatate (TESSALON PERLES) 100 MG capsule Take 1 capsule (100 mg total) by mouth 3 (three) times daily as needed. 20 capsule 0   BIOTIN PO Take 1 tablet by mouth daily.      diclofenac Sodium (VOLTAREN) 1 % GEL Apply topically.     FARXIGA 10 MG TABS tablet Take 10 mg by mouth daily.     fluticasone (FLONASE) 50 MCG/ACT nasal spray Place 2 sprays into both nostrils daily. 16 g 6   irbesartan (AVAPRO) 75 MG tablet TAKE 1 TABLET BY MOUTH EVERY DAY 90 tablet 2   meclizine (ANTIVERT) 25 MG tablet Take 25 mg by mouth in the morning and at bedtime. For 3 days     metoprolol tartrate (LOPRESSOR) 100 MG tablet Take 1 & 1/2 tabs (150mg ) by mouth 2 (two) times daily. 270 tablet 2   nitroGLYCERIN (NITROSTAT) 0.4 MG SL tablet Place 1 tablet (0.4 mg total) under the tongue every 5 (five) minutes as needed. 25 tablet 2   ondansetron (ZOFRAN-ODT) 4 MG disintegrating tablet 4 mg every 8 (eight) hours as needed.     ONETOUCH VERIO test strip 1 each 3 (three) times daily.     pantoprazole (PROTONIX) 40 MG tablet TAKE 1 TABLET BY MOUTH EVERY DAY 90 tablet 0   Potassium Chloride ER 20 MEQ TBCR Take 1 tablet by mouth daily.     rosuvastatin (CRESTOR) 10 MG tablet Take 1 tablet 3 times a week 12 tablet 11   sertraline (ZOLOFT) 50 MG tablet Take 50 mg by mouth daily.     torsemide (DEMADEX) 20 MG  tablet Take 2 tablets (40 mg total) by mouth daily as needed (swelling). 180 tablet 3   TOVIAZ 4 MG TB24 tablet Take 4 mg by mouth daily.     TRULICITY 1.5 TG/2.5WL SOPN Inject into the skin.     vitamin B-12 (CYANOCOBALAMIN) 1000 MCG tablet Take 1,000 mcg by mouth every other day.      XARELTO 20 MG TABS tablet TAKE 1 TABLET BY MOUTH DAILY WITH SUPPER. 90 tablet 1   metFORMIN (GLUCOPHAGE-XR) 500 MG 24 hr tablet Take 500 mg by mouth 2 (two) times daily. (Patient not taking: Reported on 03/18/2021)     Current Facility-Administered Medications  Medication Dose Route Frequency Provider Last Rate Last Admin   albuterol (PROVENTIL) (2.5 MG/3ML) 0.083% nebulizer solution 2.5 mg  2.5 mg Nebulization Once Juline Patch, MD        Allergies:   Ciprofloxacin    Social History:  The patient  reports that she quit smoking about 8 years ago. Her smoking use included cigarettes. She has a 50.00 pack-year smoking history. She has never used smokeless tobacco. She reports that she does not drink alcohol and does not use drugs.   Family History:  The patient's family history includes Diabetes in her mother; Heart attack in her brother and father; Heart disease in her sister; Hypertension in her mother; Pancreatic cancer in her brother; Prostate cancer (age of onset: 18) in her brother; Stroke in her mother.    ROS:  Please see the history of present illness.   Otherwise, review of systems are positive for .   All other systems are reviewed and negative.    PHYSICAL EXAM: VS:  BP 128/68 (BP Location: Left Arm, Patient Position: Sitting, Cuff Size: Normal)    Pulse (!) 109    Ht 5' (1.524 m)    Wt 197 lb 6.4  oz (89.5 kg)    SpO2 96%    BMI 38.55 kg/m  , BMI Body mass index is 38.55 kg/m. GEN: Well nourished, well developed, in no acute distress HEENT: normal Neck: no JVD, carotid bruits, or masses Cardiac: Irregularly irregular, normal rate 88 bpm; no murmurs, rubs, or gallops,no edema  Respiratory:   clear to auscultation bilaterally, normal work of breathing GI: soft, nontender, nondistended, + BS MS: no deformity or atrophy Skin: warm and dry, no rash Neuro:  Strength and sensation are intact Psych: euthymic mood, full affect   EKG:   The ekg ordered today demonstrates atrial fibrillation, nonspecific ST-T wave changes   Recent Labs: 03/26/2020: BUN 30; Creatinine, Ser 1.72; Potassium 4.9; Sodium 138   Lipid Panel    Component Value Date/Time   CHOL 206 (H) 03/26/2020 0000   TRIG 358 (H) 03/26/2020 0000   HDL 32 (L) 03/26/2020 0000   CHOLHDL 6.4 (H) 03/26/2020 0000   CHOLHDL 2.9 02/05/2019 1315   VLDL 29 02/05/2019 1315   LDLCALC 112 (H) 03/26/2020 0000     Other studies Reviewed: Additional studies/ records that were reviewed today with results demonstrating: Prior stress test in 2016..   ASSESSMENT AND PLAN:  CAD: Continue aggressive secondary prevention. Hyperlipidemia: Whole food, plant-based diet recommended. Obesity: Exercise target as noted below.  Avoid processed foods.  Gradually increase exercise.  If stamina does not improve, may need a nuclear stress test and/or echocardiogram.  She admits to having gotten lazy in the last year. HTN: The current medical regimen is effective;  continue present plan and medications. AFib: Rate control strategy.  Resting heart rate 88 bpm after she rested for a while.  Continue metoprolol. Anticoagulation: Xarelto for stroke prevention.  Hemoglobin 13.9 in October 2022 at [facility].  Creatinine 1.06 at that time. DM: Increase exercise.  Dietary recommendations as noted above.   Current medicines are reviewed at length with the patient today.  The patient concerns regarding her medicines were addressed.  The following changes have been made:  No change  Labs/ tests ordered today include:  No orders of the defined types were placed in this encounter.   Recommend 150 minutes/week of aerobic exercise Low fat, low carb, high  fiber diet recommended  Disposition:   FU in 6 months   Signed, Larae Grooms, MD  03/18/2021 4:54 PM    [facility] [address], [place], [place]  [address] Phone: [phone]; Fax: [phone]

## 2021-03-18 ENCOUNTER — Encounter: Payer: Self-pay | Admitting: Interventional Cardiology

## 2021-03-18 ENCOUNTER — Ambulatory Visit: Payer: Medicare HMO | Admitting: Interventional Cardiology

## 2021-03-18 ENCOUNTER — Other Ambulatory Visit: Payer: Self-pay

## 2021-03-18 VITALS — BP 128/68 | HR 109 | Ht 60.0 in | Wt 197.4 lb

## 2021-03-18 DIAGNOSIS — I25118 Atherosclerotic heart disease of native coronary artery with other forms of angina pectoris: Secondary | ICD-10-CM

## 2021-03-18 DIAGNOSIS — Z7901 Long term (current) use of anticoagulants: Secondary | ICD-10-CM

## 2021-03-18 DIAGNOSIS — I10 Essential (primary) hypertension: Secondary | ICD-10-CM | POA: Diagnosis not present

## 2021-03-18 DIAGNOSIS — I5042 Chronic combined systolic (congestive) and diastolic (congestive) heart failure: Secondary | ICD-10-CM

## 2021-03-18 DIAGNOSIS — E119 Type 2 diabetes mellitus without complications: Secondary | ICD-10-CM

## 2021-03-18 DIAGNOSIS — E782 Mixed hyperlipidemia: Secondary | ICD-10-CM

## 2021-03-18 DIAGNOSIS — I4821 Permanent atrial fibrillation: Secondary | ICD-10-CM | POA: Diagnosis not present

## 2021-03-18 NOTE — Patient Instructions (Signed)
Medication Instructions:  Your physician recommends that you continue on your current medications as directed. Please refer to the Current Medication list given to you today.  *If you need a refill on your cardiac medications before your next appointment, please call your pharmacy*   Lab Work: None If you have labs (blood work) drawn today and your tests are completely normal, you will receive your results only by: [place] (if you have MyChart) OR A paper copy in the mail If you have any lab test that is abnormal or we need to change your treatment, we will call you to review the results.   Testing/Procedures: None   Follow-Up: At [facility], you and your health needs are our priority.  As part of our continuing mission to provide you with exceptional heart care, we have created designated Provider Care Teams.  These Care Teams include your primary Cardiologist (physician) and Advanced Practice Providers (APPs -  Physician Assistants and Nurse Practitioners) who all work together to provide you with the care you need, when you need it.  We recommend signing up for the patient portal called "MyChart".  Sign up information is provided on this After Visit Summary.  MyChart is used to connect with patients for Virtual Visits (Telemedicine).  Patients are able to view lab/test results, encounter notes, upcoming appointments, etc.  Non-urgent messages can be sent to your provider as well.   To learn more about what you can do with MyChart, go to [email or web address].    Your next appointment:   6 month(s)  The format for your next appointment:   In Person  Provider:   Larae Grooms, MD     Other Instructions

## 2021-04-16 DIAGNOSIS — E785 Hyperlipidemia, unspecified: Secondary | ICD-10-CM | POA: Diagnosis not present

## 2021-04-16 DIAGNOSIS — E119 Type 2 diabetes mellitus without complications: Secondary | ICD-10-CM | POA: Diagnosis not present

## 2021-04-16 DIAGNOSIS — I10 Essential (primary) hypertension: Secondary | ICD-10-CM | POA: Diagnosis not present

## 2021-05-18 ENCOUNTER — Telehealth: Payer: Self-pay

## 2021-05-18 MED ORDER — ROSUVASTATIN CALCIUM 20 MG PO TABS: 20.0000 mg | ORAL_TABLET | Freq: Every day | ORAL | 3 refills | Status: DC

## 2021-05-18 NOTE — Telephone Encounter (Signed)
-----   Message from Jettie Booze, MD sent at 05/15/2021  1:45 PM EST ----- LDL above target (115).  Can she tolerate more frequent rosuvastatin.  Otherwise lipid clinic for alternative.

## 2021-05-18 NOTE — Telephone Encounter (Signed)
Leeroy Bock, RPH-CPP  05/18/2021  7:38 AM EST     Would see if pt is able to try rosuvastatin 20mg  daily (we don't have any statin intolerances listed) or add ezetimibe 10mg  daily. Also happy to see pt in lipid clinic if she prefers to discuss in person.

## 2021-05-18 NOTE — Telephone Encounter (Signed)
Order placed for rosuvastatin 20 mg PO daily per pharmacy.

## 2021-05-19 NOTE — Telephone Encounter (Signed)
Liver and lipids in 2-3 months

## 2021-05-19 NOTE — Telephone Encounter (Signed)
Left message to call office

## 2021-05-26 MED ORDER — ROSUVASTATIN CALCIUM 10 MG PO TABS: ORAL_TABLET | ORAL | 3 refills | Status: DC

## 2021-05-26 NOTE — Telephone Encounter (Signed)
I spoke with patient.  She reports she was not taking Rosuvastatin when lab work was checked in January.  States she was having some muscle pain so she stopped taking. She does not want to go to lipid clinic at this time.  She would like to try resuming previous dose of Rosuvastatin 10 mg three days per week.  She is having lab work checked at the end of April at Dr Natale Milch office. Updated prescription sent to pharmacy ?

## 2021-06-12 ENCOUNTER — Other Ambulatory Visit: Payer: Self-pay | Admitting: Interventional Cardiology

## 2021-06-18 ENCOUNTER — Other Ambulatory Visit: Payer: Self-pay | Admitting: Interventional Cardiology

## 2021-06-18 DIAGNOSIS — I482 Chronic atrial fibrillation, unspecified: Secondary | ICD-10-CM

## 2021-06-18 NOTE — Telephone Encounter (Signed)
Prescription refill request for Xarelto received.  ?Indication: Afib  ?Last office visit: 03/18/21 Irish Lack)  ?Weight: 89.5kg ?Age: 76 ?Scr: 1.2 (04/16/21) ?CrCl: 55.35m/min ? ?Appropriate dose and refill sent to requested pharmacy.  ?

## 2021-07-16 DIAGNOSIS — R8290 Unspecified abnormal findings in urine: Secondary | ICD-10-CM | POA: Diagnosis not present

## 2021-07-16 DIAGNOSIS — E785 Hyperlipidemia, unspecified: Secondary | ICD-10-CM | POA: Diagnosis not present

## 2021-07-16 DIAGNOSIS — I10 Essential (primary) hypertension: Secondary | ICD-10-CM | POA: Diagnosis not present

## 2021-07-16 DIAGNOSIS — E119 Type 2 diabetes mellitus without complications: Secondary | ICD-10-CM | POA: Diagnosis not present

## 2021-07-30 DIAGNOSIS — R799 Abnormal finding of blood chemistry, unspecified: Secondary | ICD-10-CM | POA: Diagnosis not present

## 2021-08-14 DIAGNOSIS — R799 Abnormal finding of blood chemistry, unspecified: Secondary | ICD-10-CM | POA: Diagnosis not present

## 2021-08-14 DIAGNOSIS — R8290 Unspecified abnormal findings in urine: Secondary | ICD-10-CM | POA: Diagnosis not present

## 2021-08-20 ENCOUNTER — Telehealth: Payer: Self-pay | Admitting: Interventional Cardiology

## 2021-08-20 NOTE — Telephone Encounter (Signed)
Would have Megan review the lab results and see if dose needs to be adjusted.  Jettie Booze, MD

## 2021-08-20 NOTE — Telephone Encounter (Signed)
   Pt c/o medication issue:  1. Name of Medication: xarelto  2. How are you currently taking this medication (dosage and times per day)? As written  3. Are you having a reaction (difficulty breathing--STAT)?   4. What is your medication issue? Donell Beers - Dr. Natale Milch office called. she said, Dr. Renard Hamper wanted to reach out to Dr. Irish Lack about pt's xarelto due to renal function declined from recent lab work. They wanted to know if xarelto needs to increase dose

## 2021-08-21 NOTE — Telephone Encounter (Signed)
Has she had recent labs elsewhere? We cannot see any new labs in Epic.  She is currently on Xarelto '20mg'$  daily given CrCl of 55 using most recent SCr we have available which is scanned in Epic from 04/16/21.

## 2021-08-21 NOTE — Telephone Encounter (Signed)
I spoke with Donell Beers and gave her information from [place], PharmD.  [facility] office with recheck BMET in a month and send results to our office. Will fax this note to Heide Scales, NP

## 2021-08-21 NOTE — Telephone Encounter (Signed)
Updated labs show SCr 1.4, CrCl now slightly lower at 47. Typically wait to have at least 2 consecutive BMET checks that would indicate dose change before changing Xarelto dose just to avoid constantly changing DOAC dose every time SCr is rechecked, especially since her clearance is still close to the dose cutoff. Would recommend she continue on Xarelto '20mg'$  daily for now and have BMET rechecked in 1 month. If CrCl (using actual body weight) remains < 50 at that time, ok to reduce Xarelto dose to '15mg'$  daily.

## 2021-08-21 NOTE — Telephone Encounter (Signed)
I spoke with office of Holly Scales, NP and patient's most recent lab work done on 08/14/21 showed   Creatinine-1.4 BUN- 23 GFR 38.6 Labs to be faxed to our office Office reports Holly Smith will change dose of Xarelto if needed

## 2021-09-23 ENCOUNTER — Other Ambulatory Visit: Payer: Self-pay | Admitting: Interventional Cardiology

## 2021-10-23 ENCOUNTER — Telehealth: Payer: Self-pay | Admitting: Interventional Cardiology

## 2021-10-23 DIAGNOSIS — I10 Essential (primary) hypertension: Secondary | ICD-10-CM | POA: Diagnosis not present

## 2021-10-23 DIAGNOSIS — R42 Dizziness and giddiness: Secondary | ICD-10-CM | POA: Diagnosis not present

## 2021-10-23 DIAGNOSIS — N1831 Chronic kidney disease, stage 3a: Secondary | ICD-10-CM | POA: Diagnosis not present

## 2021-10-23 DIAGNOSIS — I5020 Unspecified systolic (congestive) heart failure: Secondary | ICD-10-CM | POA: Diagnosis not present

## 2021-10-23 DIAGNOSIS — E118 Type 2 diabetes mellitus with unspecified complications: Secondary | ICD-10-CM | POA: Diagnosis not present

## 2021-10-23 NOTE — Telephone Encounter (Signed)
Pt c/o medication issue:  1. Name of Medication: XARELTO 20 MG TABS tablet  2. How are you currently taking this medication (dosage and times per day)? TAKE 1 TABLET BY MOUTH DAILY WITH SUPPER  3. Are you having a reaction (difficulty breathing--STAT)? no  4. What is your medication issue? Cyril Mourning from [facility] called she wants to know if her Xarelto could be decreased to '15mg'$  as she has had consistent low BMET's. Please advise.

## 2021-10-23 NOTE — Telephone Encounter (Signed)
Tried to call Erasmo Downer back to see what her scr has been. Office is closed. Will call again monday

## 2021-10-26 MED ORDER — RIVAROXABAN 15 MG PO TABS: 15.0000 mg | ORAL_TABLET | Freq: Every day | ORAL | 3 refills | Status: DC

## 2021-10-26 NOTE — Telephone Encounter (Signed)
Called MD office, scr has been 1.4 consistently. This gives her an actual body weight crcl of 47.5, therefore she qualifies for dose reduction. Called pt and made her aware of the adjustment. Rx sent to pharmacy.

## 2021-10-28 ENCOUNTER — Other Ambulatory Visit: Payer: Self-pay | Admitting: Interventional Cardiology

## 2021-10-28 DIAGNOSIS — I482 Chronic atrial fibrillation, unspecified: Secondary | ICD-10-CM

## 2021-12-09 ENCOUNTER — Other Ambulatory Visit: Payer: Self-pay | Admitting: Interventional Cardiology

## 2021-12-09 DIAGNOSIS — I482 Chronic atrial fibrillation, unspecified: Secondary | ICD-10-CM

## 2021-12-09 NOTE — Telephone Encounter (Signed)
Prescription refill request for Xarelto received.  Indication: Afib  Last office visit:03/18/21 [place]) Weight: 89.5kg Age: 76 Scr: 1.06 (12/25/20) CrCl: 61.14m/min  Appropriate dose and refill sent to requested pharmacy.

## 2021-12-14 ENCOUNTER — Encounter: Payer: Self-pay | Admitting: Physician Assistant

## 2021-12-14 ENCOUNTER — Other Ambulatory Visit: Payer: Self-pay | Admitting: Physician Assistant

## 2021-12-14 ENCOUNTER — Ambulatory Visit: Payer: Medicare HMO | Attending: Physician Assistant | Admitting: Physician Assistant

## 2021-12-14 VITALS — BP 114/68 | HR 108 | Ht 60.0 in | Wt 201.0 lb

## 2021-12-14 DIAGNOSIS — E782 Mixed hyperlipidemia: Secondary | ICD-10-CM

## 2021-12-14 DIAGNOSIS — I4821 Permanent atrial fibrillation: Secondary | ICD-10-CM

## 2021-12-14 DIAGNOSIS — I5032 Chronic diastolic (congestive) heart failure: Secondary | ICD-10-CM

## 2021-12-14 DIAGNOSIS — I25118 Atherosclerotic heart disease of native coronary artery with other forms of angina pectoris: Secondary | ICD-10-CM

## 2021-12-14 DIAGNOSIS — I10 Essential (primary) hypertension: Secondary | ICD-10-CM | POA: Diagnosis not present

## 2021-12-14 MED ORDER — TORSEMIDE 40 MG PO TABS: 40.0000 mg | ORAL_TABLET | Freq: Every day | ORAL | 3 refills | Status: DC

## 2021-12-14 NOTE — Progress Notes (Signed)
Cardiology Office Note:    Date:  12/14/2021   ID:  Holly Smith, Holly Smith 02/27/1946, MRN [id number]  PCP:  Rhea Bleacher, NP  [facility] Cardiologist:  Larae Grooms, MD  [place] Electrophysiologist:  None   Chief Complaint: 9 month follow up   History of Present Illness:    Holly Smith is a 76 y.o. female with a hx of chronic atrial fibrillation, h/o tachycardia mediated cardiomyopathy in 2015 with improved EF, chronic diastolic heart failure, CAD, HLD, HTN and DM seen for follow up.   Patient with history of tachycardia mediated cardiomyopathy in 2015.  She was converted.  Later had recurrent A-fib RVR.  Dr. Already not felt good candidate for ablation.  EF recovered on follow-up echocardiogram in 2016.  Patient on rate control strategy.  Patient has failed propafenone and amiodarone in the past.  Cardiac catheterization in October 2020 showed proximal and mid distal LAD stenosis.  Both lesion treated with PCI and DES.  Here today for follow up.  Breathing and volume status are stable on torsemide 40 mg daily.  Recently her Xarelto dose reduced to 15 mg based on creatinine clearance.  She denies chest pain, dizziness, orthopnea, PND, syncope, lower extremity edema or melena. She has stable dyspnea.   Past Medical History:  Diagnosis Date   A-fib ([place])    Abnormal Pap smear of vagina and vaginal HPV    Acute diastolic CHF (congestive heart failure) ([place]) 01/21/2015   Acute diverticulitis 01/28/2015   Acute on chronic diastolic heart failure ([place]) 01/21/2015   Anticoagulation adequate 08/02/2014   Overview:  Xarelto 20hs; CHADS=1+ htn, female, age  Overview:  Xarelto 20hs; CHADS=1+ htn, female, age   Arthritis    "bones ache" (01/23/2015)   Atrial fibrillation, persistent ([place]) 08/13/2015   Overview:  symptomatic (fatigue, DOE, decreased exercise tolerance, palpitations), drug refractory (propafenone, amiodarone), complicated recovery from DCCV possibly  related to repiratory obstruction [facility] (patient fear of DCCV)  Overview:  symptomatic (fatigue, DOE, decreased exercise tolerance, palpitations), drug refractory (propafenone, amiodarone), complicated recovery from DCCV possibly relat   Benign neoplasm of ascending colon    Benign neoplasm of descending colon    Benign neoplasm of sigmoid colon    Blood in stool 01/21/2015   Breath shortness 08/20/2013   CAFL (chronic airflow limitation) (HCC) 12/16/2014   Cancer (HCC)    vulvar cancer   CCF (congestive cardiac failure) ([place]) 08/03/2013   Overview:  ACUTE SYSTOLIC    Chest pain 04/26/4268   CHF (congestive heart failure) (HCC)    CHF (congestive heart failure) (HCC)    Chronic anticoagulation    She is on Xarelto for Afib   Chronic atrial fibrillation (HCC)    Chronic combined systolic and diastolic CHF, NYHA class 3 ([place]) 01/28/2015   Chronic diastolic heart failure ([place]) 05/05/2015   Condyloma acuminatum of vulva 08/02/2014   COPD (chronic obstructive pulmonary disease) ([place]) 08/13/2015   Diverticulitis large intestine w/o perforation or abscess w/bleeding 01/28/2015   Diverticulitis of large intestine without perforation or abscess with bleeding    Dysmetabolic syndrome 09/12/7626   Overview:  BG-166 06/15/2013; elevated BMI; central obesity    Dyspepsia    Dyspnea    Echocardiogram abnormal    Mitral regurgitation, tricuspic regurgitation   Essential hypertension 10/07/2014   Trial off acei 10/07/2014  Due to atypical sob (talking = walking)> no real change 11/18/2014     Family history of adverse reaction to anesthesia    sister  gets very nauseated and vomits   Gastritis without bleeding    GERD (gastroesophageal reflux disease)    History of kidney stones    History of stress test    Myoview 8/16: EF 42%, anteroseptal, inferoseptal, anterior, apical anterior and apical defect (likely represents breast attenuation versus scar), no ischemia; intermediate risk because of low EF   HPV (human  papilloma virus) anogenital infection    03/21/12 PAP + HR HPV   HTN (hypertension) 12/16/2014   Hypertension    Hypertensive heart disease 05/12/2016   Hypertensive urgency 01/21/2015   Insulin resistance    Kidney stone    Kidney stones    Malignant neoplasm of vulva ([place]) 06/19/2014   Overview:  Ms NATALIJA MAVIS is a 77 yo who was referred by Dr Ouida Sills for a vulvar lesion. 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 The specimen had negative margins, but was within 2 mm of the cancer.     Mitral regurgitation 04/23/2013   Overview:  "Moderate to severe" by TTE, "mild to mod" by TEE  Overview:  "Moderate to severe" by TTE, "mild to mod" by TEE   Obesity    OSA (obstructive sleep apnea) 11/27/2014   07/2013 RDI 17/h CPAP 8    OSA on CPAP    Other specified diseases of esophagus    Postoperative nausea and vomiting 1983   many years ago with hysterectomy   PVC (premature ventricular contraction) 12/16/2014   Sepsis ([place]) 12/13/2016   TI (tricuspid incompetence) 04/23/2013   Overview:  "moderate" by TTE    Urolithiasis    Vertigo    Vulvar cancer ([place]) 2016   invasive verrucous carcinoma of the vulva. History of genital warts and vulvar condyloma   Vulvar cancer, carcinoma ([place]) 05/10/2014    Past Surgical History:  Procedure Laterality Date   ABDOMINAL HYSTERECTOMY     APPENDECTOMY     BILATERAL SALPINGOOPHORECTOMY  2003   benign ovarian cancer   CARDIOVERSION  2014-2015   "[facility]"   COLONOSCOPY WITH PROPOFOL N/A 06/10/2015   Procedure: COLONOSCOPY WITH PROPOFOL;  Surgeon: Lucilla Lame, MD;  Location: [facility] ENDOSCOPY;  Service: Endoscopy;  Laterality: N/A;   CORONARY STENT INTERVENTION N/A 01/18/2019   Procedure: CORONARY STENT INTERVENTION;  Surgeon: Jettie Booze, MD;  Location: [place] [facility];  Service: Cardiovascular;  Laterality: N/A;   CYSTOSCOPY W/ RETROGRADES Bilateral  10/19/2016   Procedure: CYSTOSCOPY WITH RETROGRADE PYELOGRAM;  Surgeon: Hollice Espy, MD;  Location: [facility] ORS;  Service: Urology;  Laterality: Bilateral;   CYSTOSCOPY W/ URETERAL STENT PLACEMENT Left 12/29/2016   Procedure: CYSTOSCOPY WITH STENT REPLACEMENT;  Surgeon: Hollice Espy, MD;  Location: [facility] ORS;  Service: Urology;  Laterality: Left;   CYSTOSCOPY WITH BIOPSY N/A 10/19/2016   Procedure: CYSTOSCOPY WITH BLADDER BIOPSY, VAGINAL WALL BIOPSY;  Surgeon: Hollice Espy, MD;  Location: [facility] ORS;  Service: Urology;  Laterality: N/A;   CYSTOSCOPY WITH STENT PLACEMENT Left 12/14/2016   Procedure: CYSTOSCOPY WITH STENT PLACEMENT;  Surgeon: Hollice Espy, MD;  Location: [facility] ORS;  Service: Urology;  Laterality: Left;   ESOPHAGOGASTRODUODENOSCOPY (EGD) WITH PROPOFOL N/A 10/05/2016   Surgeon: Lucilla Lame, MD;  Location: [facility] ENDOSCOPY;  Results: Barrett's Esophagus- repeat in 3 years 09/2019   LEFT HEART CATH AND CORONARY ANGIOGRAPHY N/A 01/18/2019   Procedure: LEFT HEART CATH AND CORONARY ANGIOGRAPHY;  Surgeon: Jettie Booze, MD;  Location: [place] [facility];  Service:  Cardiovascular;  Laterality: N/A;   LITHOTRIPSY     STONE EXTRACTION WITH BASKET Left 12/29/2016   Procedure: STONE EXTRACTION WITH BASKET;  Surgeon: Hollice Espy, MD;  Location: [facility] ORS;  Service: Urology;  Laterality: Left;   TONSILLECTOMY     URETEROSCOPY Left 12/29/2016   Procedure: URETEROSCOPY;  Surgeon: Hollice Espy, MD;  Location: [facility] ORS;  Service: Urology;  Laterality: Left;   VULVECTOMY Right 05/10/2014   Excisional biopsy of the superior right labial majora mass; [place] PARTIAL  07/02/2014   Re-excision and sentinel node dissection at [facility].     Current Medications: Current Meds  Medication Sig   acetaminophen (TYLENOL) 325 MG tablet Take 2 tablets (650 mg total) by mouth every 6 (six) hours as needed for mild pain (or Fever >/= 101).   BIOTIN PO Take 1 tablet by mouth  daily.    diclofenac Sodium (VOLTAREN) 1 % GEL Apply topically.   FARXIGA 10 MG TABS tablet Take 10 mg by mouth daily.   fluticasone (FLONASE) 50 MCG/ACT nasal spray Place 2 sprays into both nostrils daily.   irbesartan (AVAPRO) 75 MG tablet TAKE 1 TABLET BY MOUTH EVERY DAY   meclizine (ANTIVERT) 25 MG tablet Take 25 mg by mouth in the morning and at bedtime. For 3 days   metFORMIN (GLUCOPHAGE-XR) 500 MG 24 hr tablet Take 500 mg by mouth 2 (two) times daily.   metoprolol tartrate (LOPRESSOR) 100 MG tablet TAKE 1 & 1/2 TABS ('150MG'$ ) BY MOUTH 2 (TWO) TIMES DAILY.   nitroGLYCERIN (NITROSTAT) 0.4 MG SL tablet Place 1 tablet (0.4 mg total) under the tongue every 5 (five) minutes as needed.   ondansetron (ZOFRAN-ODT) 4 MG disintegrating tablet 4 mg every 8 (eight) hours as needed.   ONETOUCH VERIO test strip 1 each 3 (three) times daily.   pantoprazole (PROTONIX) 40 MG tablet TAKE 1 TABLET BY MOUTH EVERY DAY   Potassium Chloride ER 20 MEQ TBCR Take 1 tablet by mouth daily.   Rivaroxaban (XARELTO) 15 MG TABS tablet Take 1 tablet (15 mg total) by mouth daily with supper.   rosuvastatin (CRESTOR) 10 MG tablet Take one tablet by mouth 3 days per week   sertraline (ZOLOFT) 50 MG tablet Take 50 mg by mouth daily.   TOVIAZ 4 MG TB24 tablet Take 4 mg by mouth daily.   TRULICITY 1.5 YW/7.3XT SOPN Inject into the skin.   vitamin B-12 (CYANOCOBALAMIN) 1000 MCG tablet Take 1,000 mcg by mouth every other day.    [DISCONTINUED] torsemide (DEMADEX) 20 MG tablet TAKE 2 TABLETS (40 MG TOTAL) BY MOUTH DAILY AS NEEDED (SWELLING). (Patient taking differently: Take 40 mg by mouth daily.)   Current Facility-Administered Medications for the 12/14/21 encounter (Office Visit) with Leanor Kail, PA  Medication   albuterol (PROVENTIL) (2.5 MG/3ML) 0.083% nebulizer solution 2.5 mg     Allergies:   Ciprofloxacin   Social History   Socioeconomic History   Marital status: Divorced    Spouse name: Not on file    Number of children: 1   Years of education: GED   Highest education level: 12th grade  Occupational History   Occupation: Retired  Tobacco Use   Smoking status: Former    Packs/day: 1.00    Years: 50.00    Total pack years: 50.00    Types: Cigarettes    Quit date: 05/20/2012    Years since quitting: 9.5   Smokeless tobacco: Never   Tobacco comments:    smoking cessation  materials not required  Vaping Use   Vaping Use: Never used  Substance and Sexual Activity   Alcohol use: No    Alcohol/week: 0.0 standard drinks of alcohol   Drug use: No   Sexual activity: Not Currently  Other Topics Concern   Not on file  Social History Narrative   Not on file   Social Determinants of Health   Financial Resource Strain: Low Risk  (04/13/2017)   Overall Financial Resource Strain (CARDIA)    Difficulty of Paying Living Expenses: Not hard at all  Food Insecurity: No Food Insecurity (04/13/2017)   Hunger Vital Sign    Worried About Running Out of Food in the Last Year: Never true    Ran Out of Food in the Last Year: Never true  Transportation Needs: No Transportation Needs (04/13/2017)   PRAPARE - Hydrologist (Medical): No    Lack of Transportation (Non-Medical): No  Physical Activity: Insufficiently Active (04/13/2017)   Exercise Vital Sign    Days of Exercise per Week: 2 days    Minutes of Exercise per Session: 60 min  Stress: No Stress Concern Present (04/13/2017)   [place]    Feeling of Stress : Not at all  Social Connections: Somewhat Isolated (04/13/2017)   Social Connection and Isolation Panel [NHANES]    Frequency of Communication with Friends and Family: More than three times a week    Frequency of Social Gatherings with Friends and Family: Once a week    Attends Religious Services: 1 to 4 times per year    Active Member of [employer] or Organizations: No    Attends Arts administrator: Never    Marital Status: Divorced     Family History: The patient's family history includes Diabetes in her mother; Heart attack in her brother and father; Heart disease in her sister; Hypertension in her mother; Pancreatic cancer in her brother; Prostate cancer (age of onset: 57) in her brother; Stroke in her mother. There is no history of Breast cancer, Kidney cancer, or Bladder Cancer.    ROS:   Please see the history of present illness.    All other systems reviewed and are negative.   EKGs/Labs/Other Studies Reviewed:    The following studies were reviewed today:  Cath: 01/18/19   Prox Cx lesion is 50% stenosed. More distal mid LAD-1 lesion is 80% stenosed. A drug-eluting stent was successfully placed using a STENT SYNERGY DES 2.25X28. Post intervention, there is a 0% residual stenosis. More proximal mid LAD-2 lesion is 80% stenosed. A drug-eluting stent was successfully placed using a STENT SYNERGY DES 2.75X20 postdilated to 3.0 mm. Post intervention, there is a 0% residual stenosis. Mid RCA lesion is 25% stenosed. Acute Mrg lesion is 75% stenosed. This is a small vessel. RPDA lesion is 70% stenosed. This is a small vessel. The left ventricular systolic function is normal. LV end diastolic pressure is moderately elevated. LVEDP 26 mm Hg. The left ventricular ejection fraction is 55-65% by visual estimate. There is no aortic valve stenosis.   Restart Xarelto tomorrow.  Continue Plavix 75 mg daily in addition to Xarelto.  To reduce bleeding risk, will hold off on continuing aspirin.     She has a fair amount of volume overload as well.  Will need to increase diuretics at least temporarily after she recovers from the cath.  This may help her breathing.    Continue aggressive secondary  prevention.    Diagnostic Dominance: Right  Intervention       EKG:  EKG is not  ordered today.  Recent Labs: No results found for requested labs within last 365 days.   Recent Lipid Panel    Component Value Date/Time   CHOL 206 (H) 03/26/2020 0000   TRIG 358 (H) 03/26/2020 0000   HDL 32 (L) 03/26/2020 0000   CHOLHDL 6.4 (H) 03/26/2020 0000   CHOLHDL 2.9 02/05/2019 1315   VLDL 29 02/05/2019 1315   LDLCALC 112 (H) 03/26/2020 0000     Risk Assessment/Calculations:    CHA2DS2-VASc Score = 7   This indicates a 11.2% annual risk of stroke. The patient's score is based upon: CHF History: 1 HTN History: 1 Diabetes History: 1 Stroke History: 0 Vascular Disease History: 1 Age Score: 2 Gender Score: 1      Physical Exam:    VS:  BP 114/68   Pulse (!) 108   Ht 5' (1.524 m)   Wt 201 lb (91.2 kg)   SpO2 93%   BMI 39.26 kg/m     Wt Readings from Last 3 Encounters:  12/14/21 201 lb (91.2 kg)  03/18/21 197 lb 6.4 oz (89.5 kg)  03/26/20 186 lb (84.4 kg)     GEN:  Well nourished, well developed in no acute distress HEENT: Normal NECK: No JVD; No carotid bruits LYMPHATICS: No lymphadenopathy CARDIAC: Ir IR, no murmurs, rubs, gallops RESPIRATORY:  Clear to auscultation without rales, wheezing or rhonchi  ABDOMEN: Soft, non-tender, non-distended MUSCULOSKELETAL:  No edema; No deformity  SKIN: Warm and dry NEUROLOGIC:  Alert and oriented x 3 PSYCHIATRIC:  Normal affect   ASSESSMENT AND PLAN:    Permanent atrial fibrillation Rate stable with rest.  Continue Xarelto 15 mg daily based on recent creatinine clearance documented by pharmacy.  Continue metoprolol titrate 150 mg twice daily.  2.  Chronic diastolic heart failure Volume status stable.  Continue Farxiga, torsemide, beta-blocker and irbesartan  3.  Hypertensive -Blood pressure stable on current medication  4.  Hyperlipidemia -Continue statin  5.  CAD -No angina.  Not on aspirin due to need of anticoagulation.  Continue statin and beta-blocker.  Medication Adjustments/Labs and Tests Ordered: Current medicines are reviewed at length with the patient today.  Concerns  regarding medicines are outlined above.  No orders of the defined types were placed in this encounter.  Meds ordered this encounter  Medications   torsemide 40 MG TABS    Sig: Take 40 mg by mouth daily.    Dispense:  90 tablet    Refill:  3    Pt needs appt for future refills 1st attempt thank you    Patient Instructions  Medication Instructions:  Your physician recommends that you continue on your current medications as directed. Please refer to the Current Medication list given to you today.  *If you need a refill on your cardiac medications before your next appointment, please call your pharmacy*  Lab Work: If you have labs (blood work) drawn today and your tests are completely normal, you will receive your results only by: [place] (if you have MyChart) OR A paper copy in the mail If you have any lab test that is abnormal or we need to change your treatment, we will call you to review the results.  Follow-Up: At [facility], you and your health needs are our priority.  As part of our continuing mission to provide you with exceptional heart care, we  have created designated Provider Care Teams.  These Care Teams include your primary Cardiologist (physician) and Advanced Practice Providers (APPs -  Physician Assistants and Nurse Practitioners) who all work together to provide you with the care you need, when you need it.  We recommend signing up for the patient portal called "MyChart".  Sign up information is provided on this After Visit Summary.  MyChart is used to connect with patients for Virtual Visits (Telemedicine).  Patients are able to view lab/test results, encounter notes, upcoming appointments, etc.  Non-urgent messages can be sent to your provider as well.   To learn more about what you can do with MyChart, go to [email or web address].    Your next appointment:   6 month(s)  The format for your next appointment:   In Person  Provider:   Larae Grooms, MD     Important Information About Sugar         Jarrett Soho, [place]  12/14/2021 2:21 PM    [place]

## 2021-12-14 NOTE — Patient Instructions (Signed)
Medication Instructions:  Your physician recommends that you continue on your current medications as directed. Please refer to the Current Medication list given to you today.  *If you need a refill on your cardiac medications before your next appointment, please call your pharmacy*  Lab Work: If you have labs (blood work) drawn today and your tests are completely normal, you will receive your results only by: [place] (if you have MyChart) OR A paper copy in the mail If you have any lab test that is abnormal or we need to change your treatment, we will call you to review the results.  Follow-Up: At [facility], you and your health needs are our priority.  As part of our continuing mission to provide you with exceptional heart care, we have created designated Provider Care Teams.  These Care Teams include your primary Cardiologist (physician) and Advanced Practice Providers (APPs -  Physician Assistants and Nurse Practitioners) who all work together to provide you with the care you need, when you need it.  We recommend signing up for the patient portal called "MyChart".  Sign up information is provided on this After Visit Summary.  MyChart is used to connect with patients for Virtual Visits (Telemedicine).  Patients are able to view lab/test results, encounter notes, upcoming appointments, etc.  Non-urgent messages can be sent to your provider as well.   To learn more about what you can do with MyChart, go to [email or web address].    Your next appointment:   6 month(s)  The format for your next appointment:   In Person  Provider:   Larae Grooms, MD     Important Information About Sugar

## 2022-01-25 DIAGNOSIS — M25551 Pain in right hip: Secondary | ICD-10-CM | POA: Diagnosis not present

## 2022-01-25 DIAGNOSIS — R8290 Unspecified abnormal findings in urine: Secondary | ICD-10-CM | POA: Diagnosis not present

## 2022-01-25 DIAGNOSIS — R102 Pelvic and perineal pain: Secondary | ICD-10-CM | POA: Diagnosis not present

## 2022-01-25 DIAGNOSIS — I10 Essential (primary) hypertension: Secondary | ICD-10-CM | POA: Diagnosis not present

## 2022-01-25 DIAGNOSIS — R42 Dizziness and giddiness: Secondary | ICD-10-CM | POA: Diagnosis not present

## 2022-01-25 DIAGNOSIS — M5416 Radiculopathy, lumbar region: Secondary | ICD-10-CM | POA: Diagnosis not present

## 2022-01-25 DIAGNOSIS — N1831 Chronic kidney disease, stage 3a: Secondary | ICD-10-CM | POA: Diagnosis not present

## 2022-01-25 DIAGNOSIS — I5020 Unspecified systolic (congestive) heart failure: Secondary | ICD-10-CM | POA: Diagnosis not present

## 2022-01-25 DIAGNOSIS — K219 Gastro-esophageal reflux disease without esophagitis: Secondary | ICD-10-CM | POA: Diagnosis not present

## 2022-01-25 DIAGNOSIS — E118 Type 2 diabetes mellitus with unspecified complications: Secondary | ICD-10-CM | POA: Diagnosis not present

## 2022-01-29 DIAGNOSIS — M5416 Radiculopathy, lumbar region: Secondary | ICD-10-CM | POA: Diagnosis not present

## 2022-01-29 DIAGNOSIS — M25551 Pain in right hip: Secondary | ICD-10-CM | POA: Diagnosis not present

## 2022-01-29 DIAGNOSIS — M545 Low back pain, unspecified: Secondary | ICD-10-CM | POA: Diagnosis not present

## 2022-02-02 ENCOUNTER — Other Ambulatory Visit: Payer: Self-pay | Admitting: Interventional Cardiology

## 2022-02-02 DIAGNOSIS — I482 Chronic atrial fibrillation, unspecified: Secondary | ICD-10-CM

## 2022-02-26 DIAGNOSIS — R0600 Dyspnea, unspecified: Secondary | ICD-10-CM | POA: Diagnosis not present

## 2022-02-26 DIAGNOSIS — K449 Diaphragmatic hernia without obstruction or gangrene: Secondary | ICD-10-CM | POA: Diagnosis not present

## 2022-02-26 DIAGNOSIS — R0602 Shortness of breath: Secondary | ICD-10-CM | POA: Diagnosis not present

## 2022-02-26 DIAGNOSIS — R059 Cough, unspecified: Secondary | ICD-10-CM | POA: Diagnosis not present

## 2022-03-13 ENCOUNTER — Other Ambulatory Visit: Payer: Self-pay | Admitting: Interventional Cardiology

## 2022-03-18 DIAGNOSIS — R051 Acute cough: Secondary | ICD-10-CM | POA: Diagnosis not present

## 2022-03-18 DIAGNOSIS — R059 Cough, unspecified: Secondary | ICD-10-CM | POA: Diagnosis not present

## 2022-03-18 DIAGNOSIS — K449 Diaphragmatic hernia without obstruction or gangrene: Secondary | ICD-10-CM | POA: Diagnosis not present

## 2022-04-08 ENCOUNTER — Telehealth: Payer: Medicare HMO | Admitting: Family

## 2022-04-08 ENCOUNTER — Telehealth: Payer: Self-pay | Admitting: Family

## 2022-04-08 DIAGNOSIS — A084 Viral intestinal infection, unspecified: Secondary | ICD-10-CM

## 2022-04-08 MED ORDER — ONDANSETRON 4 MG PO TBDP: 4.0000 mg | ORAL_TABLET | Freq: Three times a day (TID) | ORAL | 0 refills | Status: DC | PRN

## 2022-04-08 NOTE — Progress Notes (Signed)
Virtual Visit Consent   Holly Smith, you are scheduled for a virtual visit with a [facility] provider today. Just as with appointments in the office, your consent must be obtained to participate. Your consent will be active for this visit and any virtual visit you may have with one of our providers in the next 365 days. If you have a MyChart account, a copy of this consent can be sent to you electronically.  As this is a virtual visit, video technology does not allow for your provider to perform a traditional examination. This may limit your provider's ability to fully assess your condition. If your provider identifies any concerns that need to be evaluated in person or the need to arrange testing (such as labs, EKG, etc.), we will make arrangements to do so. Although advances in technology are sophisticated, we cannot ensure that it will always work on either your end or our end. If the connection with a video visit is poor, the visit may have to be switched to a telephone visit. With either a video or telephone visit, we are not always able to ensure that we have a secure connection.  By engaging in this virtual visit, you consent to the provision of healthcare and authorize for your insurance to be billed (if applicable) for the services provided during this visit. Depending on your insurance coverage, you may receive a charge related to this service.  I need to obtain your verbal consent now. Are you willing to proceed with your visit today? Holly Smith has provided verbal consent on 04/08/2022 for a virtual visit (video or telephone). Holly Dun, FNP  Date: 04/08/2022 6:03 PM  Virtual Visit via Video Note   I, Holly Smith, connected with  Holly Smith  ([id number], 11/01/1945) on 04/08/22 at  6:00 PM EST by a video-enabled telemedicine application and verified that I am speaking with the correct person using two identifiers.  Location: Patient: Virtual Visit  Location Patient: Home Provider: Virtual Visit Location Provider: Home Office   I discussed the limitations of evaluation and management by telemedicine and the availability of in person appointments. The patient expressed understanding and agreed to proceed.    History of Present Illness: Holly Smith is a 77 y.o. who identifies as a female who was assigned female at birth, and is being seen today for nausea, vomiting, and diarrhea. Reports two days ago she had diarrhea and vomiting, then resolved yesterday and this morning she vomited and had one episode of diarrhea this morning.   HPI: Diarrhea  This is a new problem. The current episode started in the past 7 days. The problem occurs less than 2 times per day. The problem has been gradually improving. The stool consistency is described as Watery. Associated symptoms include abdominal pain and vomiting. Pertinent negatives include no bloating, chills, coughing, fever or headaches.  Emesis  This is a new problem. The current episode started yesterday. The problem occurs less than 2 times per day. The problem has been gradually worsening. The emesis has an appearance of stomach contents. Associated symptoms include abdominal pain and diarrhea. Pertinent negatives include no chills, coughing, fever or headaches. She has tried bed rest and acetaminophen for the symptoms. The treatment provided mild relief.    Problems:  Patient Active Problem List   Diagnosis Date Noted   CAD (coronary artery disease) 10/08/2020   Type 2 diabetes mellitus without complication, without long-term current use of insulin ([place]) 10/08/2020   Stable  angina    Lichen planus 18/84/1660   Vertigo 03/11/2017   Sepsis ([place]) 12/13/2016   Dyspepsia    Gastritis without bleeding    Other specified diseases of esophagus    History of alcohol use 09/07/2016   Hypertensive heart disease 05/12/2016   Atrial fibrillation, persistent ([place]) 08/13/2015   COPD (chronic  obstructive pulmonary disease) ([place]) 08/13/2015   Personal history of other specified conditions 08/13/2015   Insulin resistance 08/13/2015   Obesity, unspecified 08/13/2015   Special screening for malignant neoplasms, colon    Benign neoplasm of ascending colon    Benign neoplasm of descending colon    Benign neoplasm of sigmoid colon    Chronic diastolic heart failure ([place]) 05/05/2015   Diverticulitis 01/31/2015   Diverticulitis large intestine w/o perforation or abscess w/bleeding 01/28/2015   Chronic combined systolic and diastolic CHF, NYHA class 3 ([place]) 01/28/2015   Acute diverticulitis 01/28/2015   Diverticulitis of large intestine without perforation or abscess with bleeding    Acute on chronic diastolic heart failure ([place]) 01/21/2015   Hypertensive urgency 01/21/2015   Morbid obesity ([place]) 01/21/2015   Blood in stool 63/03/6008   Acute diastolic CHF (congestive heart failure) ([place]) 01/21/2015   CAFL (chronic airflow limitation) ([place]) 12/16/2014   Personal history of perinatal problems 12/16/2014   Hyperlipidemia, unspecified 93/23/5573   Dysmetabolic syndrome 22/04/5425   Kidney stones 12/16/2014   PVC (premature ventricular contraction) 12/16/2014   Snoring 12/16/2014   Stress incontinence 12/16/2014   Fast heart beat 12/16/2014   Calculus (=stone) 12/16/2014   OSA (obstructive sleep apnea) 11/27/2014   Dyspnea 10/07/2014   Essential hypertension 10/07/2014   Obesity 10/07/2014   Genital warts 08/02/2014   Condyloma acuminatum of vulva 08/02/2014   Condylomata lata of vulva 08/02/2014   Anticoagulation adequate 08/02/2014   Chronic atrial fibrillation ([place]) 08/02/2014   Current tobacco use 08/02/2014   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[place]) 06/19/2014   Malignant neoplasm of vulva ([place]) 06/19/2014   Vulvar cancer ([place]) 05/10/2014   Vulvar cancer, carcinoma ([place]) 05/10/2014   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 03/26/2014   Chest pain 08/20/2013   Breath shortness  08/20/2013   CCF (congestive cardiac failure) ([place]) 08/03/2013   CHF (congestive heart failure) ([place]) 08/03/2013   H/O transesophageal echocardiography (TEE) for monitoring 06/21/2013   Echocardiogram abnormal 04/23/2013   Mitral regurgitation 04/23/2013   TI (tricuspid incompetence) 04/23/2013    Allergies:  Allergies  Allergen Reactions   Ciprofloxacin Other (See Comments)    SOB   Medications:  Current Outpatient Medications:    ondansetron (ZOFRAN-ODT) 4 MG disintegrating tablet, Take 1 tablet (4 mg total) by mouth every 8 (eight) hours as needed for nausea or vomiting., Disp: 20 tablet, Rfl: 0   acetaminophen (TYLENOL) 325 MG tablet, Take 2 tablets (650 mg total) by mouth every 6 (six) hours as needed for mild pain (or Fever >/= 101)., Disp: , Rfl:    BIOTIN PO, Take 1 tablet by mouth daily. , Disp: , Rfl:    diclofenac Sodium (VOLTAREN) 1 % GEL, Apply topically., Disp: , Rfl:    FARXIGA 10 MG TABS tablet, Take 10 mg by mouth daily., Disp: , Rfl:    fluticasone (FLONASE) 50 MCG/ACT nasal spray, Place 2 sprays into both nostrils daily., Disp: 16 g, Rfl: 6   irbesartan (AVAPRO) 75 MG tablet, TAKE 1 TABLET BY MOUTH EVERY DAY, Disp: 90 tablet, Rfl: 2   meclizine (ANTIVERT) 25 MG tablet, Take 25 mg by mouth in  the morning and at bedtime. For 3 days, Disp: , Rfl:    metFORMIN (GLUCOPHAGE-XR) 500 MG 24 hr tablet, Take 500 mg by mouth 2 (two) times daily., Disp: , Rfl:    metoprolol tartrate (LOPRESSOR) 100 MG tablet, TAKE 1 & 1/2 TABS ('150MG'$ ) BY MOUTH 2 (TWO) TIMES DAILY., Disp: 270 tablet, Rfl: 1   nitroGLYCERIN (NITROSTAT) 0.4 MG SL tablet, Place 1 tablet (0.4 mg total) under the tongue every 5 (five) minutes as needed., Disp: 25 tablet, Rfl: 2   ONETOUCH VERIO test strip, 1 each 3 (three) times daily., Disp: , Rfl:    pantoprazole (PROTONIX) 40 MG tablet, Take 1 tablet (40 mg total) by mouth daily., Disp: 90 tablet, Rfl: 2   Potassium Chloride ER 20 MEQ TBCR, Take 1 tablet by mouth  daily., Disp: , Rfl:    Rivaroxaban (XARELTO) 15 MG TABS tablet, Take 1 tablet (15 mg total) by mouth daily with supper., Disp: 90 tablet, Rfl: 3   rosuvastatin (CRESTOR) 10 MG tablet, Take one tablet by mouth 3 days per week, Disp: 36 tablet, Rfl: 3   sertraline (ZOLOFT) 50 MG tablet, Take 50 mg by mouth daily., Disp: , Rfl:    torsemide 40 MG TABS, Take 40 mg by mouth daily., Disp: 90 tablet, Rfl: 3   TOVIAZ 4 MG TB24 tablet, Take 4 mg by mouth daily., Disp: , Rfl:    TRULICITY 1.5 EZ/6.6QH SOPN, Inject into the skin., Disp: , Rfl:    vitamin B-12 (CYANOCOBALAMIN) 1000 MCG tablet, Take 1,000 mcg by mouth every other day. , Disp: , Rfl:    XARELTO 20 MG TABS tablet, TAKE 1 TABLET BY MOUTH DAILY WITH SUPPER, Disp: 90 tablet, Rfl: 0  Current Facility-Administered Medications:    albuterol (PROVENTIL) (2.5 MG/3ML) 0.083% nebulizer solution 2.5 mg, 2.5 mg, Nebulization, Once, Jones, Deanna C, MD  Observations/Objective: Patient is well-developed, well-nourished in no acute distress.  Resting comfortably  at home.  Head is normocephalic, atraumatic.  No labored breathing.  Speech is clear and coherent with logical content.  Patient is alert and oriented at baseline.    Assessment and Plan: 1. Viral gastroenteritis - ondansetron (ZOFRAN-ODT) 4 MG disintegrating tablet; Take 1 tablet (4 mg total) by mouth every 8 (eight) hours as needed for nausea or vomiting.  Dispense: 20 tablet; Refill: 0  Bland diet Force fluids Continue to monitor glucose, 110 earlier when checking Take zofran every 8 hours for next 24 hours Imodium as needed for diarrhea Follow up with PCP if symptoms worsen or do not improve   Follow Up Instructions: I discussed the assessment and treatment plan with the patient. The patient was provided an opportunity to ask questions and all were answered. The patient agreed with the plan and demonstrated an understanding of the instructions.  A copy of instructions were sent to  the patient via MyChart unless otherwise noted below.     The patient was advised to call back or seek an in-person evaluation if the symptoms worsen or if the condition fails to improve as anticipated.  Time:  I spent 13 minutes with the patient via telehealth technology discussing the above problems/concerns.    Holly Dun, FNP

## 2022-04-08 NOTE — Telephone Encounter (Signed)
Patient's granddaughter Holly Smith was calling to see if Dr. Nani Ravens could take patient on as a new patient. Dr. Nani Ravens sees her dad Holly Smith and used to see her stepmom Cicily Bonano before she passed. Patient has been sick for a couple of months and they have become frustrated with the current provider and want to transfer her somewhere they feel comfortable she'll be taken care of. Advised granddaughter to take her to urgent care or to do an evisit via mychart since we could not schedule her an appointment yet since she hasn't established care yet. Holly Smith understands and will look for a call back. [phone]

## 2022-04-25 ENCOUNTER — Other Ambulatory Visit: Payer: Self-pay | Admitting: Interventional Cardiology

## 2022-04-27 ENCOUNTER — Ambulatory Visit (INDEPENDENT_AMBULATORY_CARE_PROVIDER_SITE_OTHER): Payer: Medicare HMO | Admitting: Family Medicine

## 2022-04-27 ENCOUNTER — Encounter: Payer: Self-pay | Admitting: Family Medicine

## 2022-04-27 VITALS — BP 124/80 | HR 55 | Temp 97.6°F | Ht 60.0 in | Wt 200.0 lb

## 2022-04-27 DIAGNOSIS — Z1211 Encounter for screening for malignant neoplasm of colon: Secondary | ICD-10-CM | POA: Diagnosis not present

## 2022-04-27 DIAGNOSIS — G4733 Obstructive sleep apnea (adult) (pediatric): Secondary | ICD-10-CM | POA: Diagnosis not present

## 2022-04-27 DIAGNOSIS — G4700 Insomnia, unspecified: Secondary | ICD-10-CM | POA: Diagnosis not present

## 2022-04-27 DIAGNOSIS — E1165 Type 2 diabetes mellitus with hyperglycemia: Secondary | ICD-10-CM | POA: Diagnosis not present

## 2022-04-27 DIAGNOSIS — Z1231 Encounter for screening mammogram for malignant neoplasm of breast: Secondary | ICD-10-CM | POA: Diagnosis not present

## 2022-04-27 MED ORDER — TRAZODONE HCL 50 MG PO TABS: 25.0000 mg | ORAL_TABLET | Freq: Every evening | ORAL | 3 refills | Status: DC | PRN

## 2022-04-27 NOTE — Patient Instructions (Addendum)
Try to get at least 7 hrs of sleep nightly.   If you do not hear anything about your referral in the next 1-2 weeks, call our office and ask for an update.  Aim to do some physical exertion for 150 minutes per week. This is typically divided into 5 days per week, 30 minutes per day. The activity should be enough to get your heart rate up. Anything is better than nothing if you have time constraints.  Schedule your eye exam at your convenience.   Please wear your CPAP every night.   Sleep Hygiene Tips: Do not watch TV or look at screens within 1 hour of going to bed. If you do, make sure there is a blue light filter (nighttime mode) involved. Try to go to bed around the same time every night. Wake up at the same time within 1 hour of regular time. Ex: If you wake up at 7 AM for work, do not sleep past 8 AM on days that you don't work. Do not drink alcohol before bedtime. Do not consume caffeine-containing beverages after noon or within 9 hours of intended bedtime. Get regular exercise/physical activity in your life, but not within 2 hours of planned bedtime. Do not take naps.  Do not eat within 2 hours of planned bedtime. Melatonin, 3-5 mg 30-60 minutes before planned bedtime may be helpful.  The bed should be for sleep or sex only. If after 20-30 minutes you are unable to fall asleep, get up and do something relaxing. Do this until you feel ready to go to sleep again.   Sleep is important to [place] all. Getting good sleep is imperative to adequate functioning during the day. Work with our counselors who are trained to help people obtain quality sleep. Call [phone] to schedule an appointment or if you are curious about insurance coverage/cost.  Let us know if you need anything.

## 2022-04-27 NOTE — Progress Notes (Signed)
Chief Complaint  Patient presents with   New Patient (Initial Visit)    Problems with diarrhea        New Patient Visit SUBJECTIVE: HPI: Holly Smith is an 77 y.o.female who is being seen for establishing care. Here w son.    The patient was previously seen at [facility]  Over the past 6 months, the patient has had more fatigue.  She has a history of OSA on CPAP.  She does not always remember to use it.  Her last sleep study was around 15 years ago.  She does not currently have a sleep doctor.  When she does sleep, she has frequent awakenings.  She can fall asleep throughout the day.  Stress levels are high.  Past Medical History:  Diagnosis Date   A-fib (HCC)    Abnormal Pap smear of vagina and vaginal HPV    Acute diastolic CHF (congestive heart failure) ([place]) 01/21/2015   Acute diverticulitis 01/28/2015   Acute on chronic diastolic heart failure ([place]) 01/21/2015   Anticoagulation adequate 08/02/2014   Overview:  Xarelto 20hs; CHADS=1+ htn, female, age  Overview:  Xarelto 20hs; CHADS=1+ htn, female, age   Arthritis    "bones ache" (01/23/2015)   Atrial fibrillation, persistent ([place]) 08/13/2015   Overview:  symptomatic (fatigue, DOE, decreased exercise tolerance, palpitations), drug refractory (propafenone, amiodarone), complicated recovery from DCCV possibly related to repiratory obstruction [facility] (patient fear of DCCV)  Overview:  symptomatic (fatigue, DOE, decreased exercise tolerance, palpitations), drug refractory (propafenone, amiodarone), complicated recovery from DCCV possibly relat   Benign neoplasm of ascending colon    Benign neoplasm of descending colon    Benign neoplasm of sigmoid colon    Blood in stool 01/21/2015   Breath shortness 08/20/2013   CAFL (chronic airflow limitation) (HCC) 12/16/2014   Cancer (HCC)    vulvar cancer   CCF (congestive cardiac failure) ([place]) 08/03/2013   Overview:  ACUTE SYSTOLIC    Chest pain 38/25/0539   CHF  (congestive heart failure) (HCC)    CHF (congestive heart failure) (HCC)    Chronic anticoagulation    She is on Xarelto for Afib   Chronic atrial fibrillation (HCC)    Chronic combined systolic and diastolic CHF, NYHA class 3 ([place]) 01/28/2015   Chronic diastolic heart failure ([place]) 05/05/2015   Condyloma acuminatum of vulva 08/02/2014   COPD (chronic obstructive pulmonary disease) ([place]) 08/13/2015   Diverticulitis large intestine w/o perforation or abscess w/bleeding 01/28/2015   Diverticulitis of large intestine without perforation or abscess with bleeding    Dysmetabolic syndrome 76/73/4193   Overview:  BG-166 06/15/2013; elevated BMI; central obesity    Dyspnea    Echocardiogram abnormal    Mitral regurgitation, tricuspic regurgitation   Essential hypertension 10/07/2014   Trial off acei 10/07/2014  Due to atypical sob (talking = walking)> no real change 11/18/2014     Family history of adverse reaction to anesthesia    sister gets very nauseated and vomits   Gastritis without bleeding    GERD (gastroesophageal reflux disease)    History of kidney stones    History of stress test    Myoview 8/16: EF 42%, anteroseptal, inferoseptal, anterior, apical anterior and apical defect (likely represents breast attenuation versus scar), no ischemia; intermediate risk because of low EF   HPV (human papilloma virus) anogenital infection    03/21/12 PAP + HR HPV   HTN (hypertension) 12/16/2014   Hypertension    Hypertensive heart disease 05/12/2016   Hypertensive  urgency 01/21/2015   Insulin resistance    Kidney stones    Malignant neoplasm of vulva ([place]) 06/19/2014   Overview:  Holly Smith is a 77 yo who was referred by Dr Ouida Sills for a vulvar lesion. 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 The specimen had negative margins, but was within 2 mm of the cancer.     Mitral regurgitation 04/23/2013    Overview:  "Moderate to severe" by TTE, "mild to mod" by TEE  Overview:  "Moderate to severe" by TTE, "mild to mod" by TEE   Obesity    OSA (obstructive sleep apnea) 11/27/2014   07/2013 RDI 17/h CPAP 8    Other specified diseases of esophagus    Postoperative nausea and vomiting 1983   many years ago with hysterectomy   PVC (premature ventricular contraction) 12/16/2014   TI (tricuspid incompetence) 04/23/2013   Overview:  "moderate" by TTE    Urolithiasis    Vertigo    Vulvar cancer ([place]) 2016   invasive verrucous carcinoma of the vulva. History of genital warts and vulvar condyloma   Vulvar cancer, carcinoma ([place]) 05/10/2014   Past Surgical History:  Procedure Laterality Date   ABDOMINAL HYSTERECTOMY     APPENDECTOMY     BILATERAL SALPINGOOPHORECTOMY  2003   benign ovarian cancer   CARDIOVERSION  2014-2015   "[facility]"   COLONOSCOPY WITH PROPOFOL N/A 06/10/2015   Procedure: COLONOSCOPY WITH PROPOFOL;  Surgeon: Lucilla Lame, MD;  Location: [facility] ENDOSCOPY;  Service: Endoscopy;  Laterality: N/A;   CORONARY STENT INTERVENTION N/A 01/18/2019   Procedure: CORONARY STENT INTERVENTION;  Surgeon: Jettie Booze, MD;  Location: [facility];  Service: Cardiovascular;  Laterality: N/A;   CYSTOSCOPY W/ RETROGRADES Bilateral 10/19/2016   Procedure: CYSTOSCOPY WITH RETROGRADE PYELOGRAM;  Surgeon: Hollice Espy, MD;  Location: [facility] ORS;  Service: Urology;  Laterality: Bilateral;   CYSTOSCOPY W/ URETERAL STENT PLACEMENT Left 12/29/2016   Procedure: CYSTOSCOPY WITH STENT REPLACEMENT;  Surgeon: Hollice Espy, MD;  Location: [facility] ORS;  Service: Urology;  Laterality: Left;   CYSTOSCOPY WITH BIOPSY N/A 10/19/2016   Procedure: CYSTOSCOPY WITH BLADDER BIOPSY, VAGINAL WALL BIOPSY;  Surgeon: Hollice Espy, MD;  Location: [facility] ORS;  Service: Urology;  Laterality: N/A;   CYSTOSCOPY WITH STENT PLACEMENT Left 12/14/2016   Procedure: CYSTOSCOPY WITH STENT PLACEMENT;  Surgeon: Hollice Espy, MD;  Location: [facility] ORS;  Service: Urology;  Laterality: Left;   ESOPHAGOGASTRODUODENOSCOPY (EGD) WITH PROPOFOL N/A 10/05/2016   Surgeon: Lucilla Lame, MD;  Location: [facility] ENDOSCOPY;  Results: Barrett's Esophagus- repeat in 3 years 09/2019   LEFT HEART CATH AND CORONARY ANGIOGRAPHY N/A 01/18/2019   Procedure: LEFT HEART CATH AND CORONARY ANGIOGRAPHY;  Surgeon: Jettie Booze, MD;  Location: [facility];  Service: Cardiovascular;  Laterality: N/A;   LITHOTRIPSY     STONE EXTRACTION WITH BASKET Left 12/29/2016   Procedure: STONE EXTRACTION WITH BASKET;  Surgeon: Hollice Espy, MD;  Location: [facility] ORS;  Service: Urology;  Laterality: Left;   TONSILLECTOMY     URETEROSCOPY Left 12/29/2016   Procedure: URETEROSCOPY;  Surgeon: Hollice Espy, MD;  Location: [facility] ORS;  Service: Urology;  Laterality: Left;   VULVECTOMY Right 05/10/2014   Excisional biopsy of the superior right labial majora mass; [place] PARTIAL  07/02/2014   Re-excision and sentinel node dissection at [facility].    Family History  Problem Relation Age of  Onset   Prostate cancer Brother 76       still living and well   Heart attack Father    Stroke Mother    Diabetes Mother    Hypertension Mother    Heart attack Brother    Pancreatic cancer Brother    Heart disease Sister    Breast cancer Neg Hx    Kidney cancer Neg Hx    Bladder Cancer Neg Hx    Allergies  Allergen Reactions   Ciprofloxacin Other (See Comments)    SOB    Current Outpatient Medications:    BIOTIN PO, Take 1 tablet by mouth daily. , Disp: , Rfl:    Cholecalciferol (VITAMIN D3) 1000 units CAPS, Take by mouth., Disp: , Rfl:    diclofenac Sodium (VOLTAREN) 1 % GEL, Apply topically., Disp: , Rfl:    FARXIGA 10 MG TABS tablet, Take 10 mg by mouth daily., Disp: , Rfl:    fluticasone (FLONASE) 50 MCG/ACT nasal spray, Place 2 sprays into both nostrils daily., Disp: 16 g, Rfl: 6   irbesartan (AVAPRO) 75 MG tablet, TAKE  1 TABLET BY MOUTH EVERY DAY, Disp: 90 tablet, Rfl: 2   metoprolol tartrate (LOPRESSOR) 100 MG tablet, TAKE 1 & 1/2 TABS ('150MG'$ ) BY MOUTH 2 (TWO) TIMES DAILY., Disp: 270 tablet, Rfl: 2   nitroGLYCERIN (NITROSTAT) 0.4 MG SL tablet, Place 1 tablet (0.4 mg total) under the tongue every 5 (five) minutes as needed., Disp: 25 tablet, Rfl: 2   ONETOUCH VERIO test strip, 1 each 3 (three) times daily., Disp: , Rfl:    pantoprazole (PROTONIX) 40 MG tablet, Take 1 tablet (40 mg total) by mouth daily., Disp: 90 tablet, Rfl: 2   Rivaroxaban (XARELTO) 15 MG TABS tablet, Take 1 tablet (15 mg total) by mouth daily with supper., Disp: 90 tablet, Rfl: 3   torsemide 40 MG TABS, Take 40 mg by mouth daily., Disp: 90 tablet, Rfl: 3   TOVIAZ 4 MG TB24 tablet, Take 4 mg by mouth daily., Disp: , Rfl:    traZODone (DESYREL) 50 MG tablet, Take 0.5-1 tablets (25-50 mg total) by mouth at bedtime as needed for sleep., Disp: 30 tablet, Rfl: 3   TRULICITY 1.5 XA/1.2IN SOPN, Inject into the skin., Disp: , Rfl:    vitamin B-12 (CYANOCOBALAMIN) 1000 MCG tablet, Take 1,000 mcg by mouth every other day. , Disp: , Rfl:   Current Facility-Administered Medications:    albuterol (PROVENTIL) (2.5 MG/3ML) 0.083% nebulizer solution 2.5 mg, 2.5 mg, Nebulization, Once, Jones, Deanna C, MD  OBJECTIVE: BP 124/80 (BP Location: Right Arm, Patient Position: Sitting, Cuff Size: Normal)   Pulse (!) 55   Temp 97.6 F (36.4 C) (Oral)   Ht 5' (1.524 m)   Wt 200 lb (90.7 kg)   SpO2 95%   BMI 39.06 kg/m  General:  well developed, well nourished, in no apparent distress Skin:  no significant moles, warts, or growths Nose:  nares patent, septum midline, mucosa normal Throat/Pharynx:  lips and gingiva without lesion; tongue and uvula midline; non-inflamed pharynx; no exudates or postnasal drainage Lungs:  clear to auscultation, breath sounds equal bilaterally, no respiratory distress Cardio:  regular rate and rhythm, no LE edema or  bruits Musculoskeletal:  symmetrical muscle groups noted without atrophy or deformity Neuro:  gait normal Psych: well oriented with normal range of affect and appropriate judgment/insight  ASSESSMENT/PLAN: OSA on CPAP - Plan: Ambulatory referral to Neurology  Insomnia, unspecified type - Plan: traZODone (DESYREL) 50 MG tablet  Type 2 diabetes mellitus with hyperglycemia, without long-term current use of insulin ([place])  Encounter for screening mammogram for malignant neoplasm of breast - Plan: MM DIGITAL SCREENING BILATERAL  Screen for colon cancer - Plan: Ambulatory referral to Gastroenterology  She needs to get another sleep study done.  Encouraged compliance with her mask. Chronic, uncontrolled.  Uncontrolled sleep apnea limits some of her options.  Will start with 25-50 mg nightly of trazodone.  Sleep hygiene information provided in her paperwork.  Sleep resources provided in paperwork. Needs to schedule eye exam.  Her son will take care of this. Schedule mammogram. Due for colon cancer screening. Patient should return in 1 mo to reck #2. The patient and her son voiced understanding and agreement to the plan.   [place], DO 04/27/22  4:43 PM

## 2022-05-14 ENCOUNTER — Telehealth (HOSPITAL_BASED_OUTPATIENT_CLINIC_OR_DEPARTMENT_OTHER): Payer: Self-pay

## 2022-05-19 ENCOUNTER — Other Ambulatory Visit: Payer: Self-pay | Admitting: Family Medicine

## 2022-05-19 DIAGNOSIS — G4700 Insomnia, unspecified: Secondary | ICD-10-CM

## 2022-05-19 NOTE — Telephone Encounter (Signed)
Wants a 90 day refill

## 2022-05-25 ENCOUNTER — Ambulatory Visit (INDEPENDENT_AMBULATORY_CARE_PROVIDER_SITE_OTHER): Payer: Medicare HMO | Admitting: Family Medicine

## 2022-05-25 ENCOUNTER — Encounter: Payer: Self-pay | Admitting: Family Medicine

## 2022-05-25 ENCOUNTER — Ambulatory Visit: Payer: Medicare HMO | Admitting: Neurology

## 2022-05-25 ENCOUNTER — Encounter: Payer: Self-pay | Admitting: Neurology

## 2022-05-25 VITALS — BP 124/78 | HR 92 | Temp 98.1°F | Ht 60.0 in | Wt 203.4 lb

## 2022-05-25 DIAGNOSIS — G4700 Insomnia, unspecified: Secondary | ICD-10-CM | POA: Insufficient documentation

## 2022-05-25 DIAGNOSIS — I2510 Atherosclerotic heart disease of native coronary artery without angina pectoris: Secondary | ICD-10-CM

## 2022-05-25 DIAGNOSIS — I482 Chronic atrial fibrillation, unspecified: Secondary | ICD-10-CM

## 2022-05-25 DIAGNOSIS — I5033 Acute on chronic diastolic (congestive) heart failure: Secondary | ICD-10-CM | POA: Diagnosis not present

## 2022-05-25 DIAGNOSIS — G4736 Sleep related hypoventilation in conditions classified elsewhere: Secondary | ICD-10-CM | POA: Diagnosis not present

## 2022-05-25 DIAGNOSIS — G4733 Obstructive sleep apnea (adult) (pediatric): Secondary | ICD-10-CM | POA: Diagnosis not present

## 2022-05-25 MED ORDER — TRAZODONE HCL 50 MG PO TABS: 50.0000 mg | ORAL_TABLET | Freq: Every evening | ORAL | 2 refills | Status: DC | PRN

## 2022-05-25 NOTE — Progress Notes (Signed)
SLEEP MEDICINE CLINIC    Provider:  Larey Seat, MD  Primary Care Physician:  Shelda Pal, [address] [place] [address]     Referring Provider: Ileene Rubens [address] [place],  [place] [address]          Chief Complaint according to patient   Patient presents with:     New Patient (Initial Visit)      Patient in room #1 and alone. Pt here today for frequent awakenings & falling asleep throughout the day and has hx of fatigue and OSA on CPAP.        HISTORY OF PRESENT ILLNESS:  Holly Smith is a 77 y.o. female OSA and A fib patient who is seen upon referral on 05/25/2022 from [place] for a new sleep evaluation.  Chief concern according to patient :  I need a new CPAP    I have the pleasure of seeing Tyeesha Shinique Fohl 05/25/22 a right-handed female with a  sleep disorder. She had a sleep study in 2015 at [facility] , and had a CPAP titration , too. She dislikes FFM. She developed atrial fib and failed 2 cardioversion, but she did not reach sustained sinus rhythm.  Finally she saw Dr Rex Kras, Cardiology. He referred for ablation, and she saw Dr Rayann Heman who stated it won't work. She continues to use CPAP, she is using a REM Star pro - at least 79 years old and on recall. Nuance pro  nasal pillows.  She needs a new machine :  Summary of her 2 sleep studies:  Sleep study #1 took place on 5 - 5 - 15 the interpreting physician was Dr. Ginette Pitman.  The patient slept 342 minutes with a sleep efficiency of 83% 16.8 minutes sleep latency REM latency 82 minutes.  Remarkably this patient had 34% REM sleep.  She reached an AHI of 14.5/h her apnea was worse when she slept on the left than when she slept supine which is remarkable.  Her REM AHI was 32/h and NREM AHI was 5.6/h.  So this is clearly REM sleep dependent sleep apnea and apparently obstructive hypopnea in origin as there were very few frank apnea  seen.  Oxygen saturation at nadir was 80% and total time in oxygen desaturation under 90% was 12.6% of total sleep time.  Cardiac arrhythmia was detected.  She did not have PLM arousals.  The minimum heart rate was 69 and the maximum heart rate was 104 bpm.  This was followed by a CPAP trial on 08-02-13, sleep efficiency was now only 72% for CPAP titration.  Sleep latency was 46 minutes.  REM sleep latency was 124 minutes.  She still reached 35% REM sleep.  She had PLM's but did not wake up from them.  And she was titrated to 8 cm water pressure.  Her AHI was 0.5/h her REM AHI was 0.9/h her supine AHI was 1.8/h.  Again she had no frank apneas but lots of hypopneas.  The titration was successful.    CPAP compliance data :  She was then issued a [employer] REM star machine which is set at 8 cm water pressure with 2 cm expiratory relief and allows for an average AHI residual of 1.8/h.  So this is a very successful treatment and she has been highly compliant over the last 30 days she has used his antique CPAP machine 28 out of  30 days over 4 hours and 100% of days.  Very little air leak is noted she is using nasal pillows the so-called new walls Pro gel.  Her DME was [employer].     Sleep relevant medical history: Nocturia 2 r times, 3 and 6 AM,  Dream enactment , Tonsillectomy, at age 45-8    Family medical /sleep history: one son on CPAP with OSA.   Social history:  Patient is  retired from [employer] (medical) and lives in a household with her son,  her daughter in law died of Covid and they moved together.  Family status is widowed , with 1 adult son , 1 granddaughter.   Pets are present. 2 dogs.  Tobacco use yes, until 2014.  ETOH use ; none,  Caffeine intake in form of Coffee( 1 cup in AM ) Soda( 2-3 a day) Tea ( /) or energy drinks Exercise in form of ; none.         Sleep habits are as follows: The patient's dinner time is between 6 PM. The patient goes to bed at 11 PM and continues to  sleep for 7 hours, wakes for 2 bathroom breaks, the first time at 3 AM.   The preferred sleep position is on the left side, supine , with the support of 2 pillows.  Dreams are reportedly  frequent/vivid before she got on medication.   The patient wakes up spontaneously 6-7 . 9.30  AM is the usual rise time. She reports not feeling refreshed or restored in AM, with symptoms such as dry mouth, morning headaches, and residual fatigue.  Naps are taken infrequently, in front of the TV- lasting from 15 to 30 minutes and are refreshing .   Review of Systems: Out of a complete 14 system review, the patient complains of only the following symptoms, and all other reviewed systems are negative.:  Fatigue, sleepiness , snoring, fragmented sleep,  Nocturia , parasomnia    How likely are you to doze in the following situations: 0 = not likely, 1 = slight chance, 2 = moderate chance, 3 = high chance   Sitting and Reading? Watching Television? Sitting inactive in a public place (theater or meeting)? As a passenger in a car for an hour without a break? Lying down in the afternoon when circumstances permit? Sitting and talking to someone? Sitting quietly after lunch without alcohol? In a car, while stopped for a few minutes in traffic?   Total = 11/ 24 points   FSS endorsed at 30/ 63 points.   GDS : 5/15.  On Trazodone.   Social History   Socioeconomic History   Marital status: Divorced    Spouse name: Not on file   Number of children: 1   Years of education: GED   Highest education level: 12th grade  Occupational History   Occupation: Retired  Tobacco Use   Smoking status: Former    Packs/day: 1.00    Years: 50.00    Total pack years: 50.00    Types: Cigarettes    Quit date: 05/20/2012    Years since quitting: 10.0   Smokeless tobacco: Never   Tobacco comments:    smoking cessation materials not required  Vaping Use   Vaping Use: Never used  Substance and Sexual Activity   Alcohol  use: No    Alcohol/week: 0.0 standard drinks of alcohol   Drug use: No   Sexual activity: Not Currently  Other Topics Concern   Not on file  Social History Narrative   Not on file   Social Determinants of Health   Financial Resource Strain: Low Risk  (04/13/2017)   Overall Financial Resource Strain (CARDIA)    Difficulty of Paying Living Expenses: Not hard at all  Food Insecurity: No Food Insecurity (04/13/2017)   Hunger Vital Sign    Worried About Running Out of Food in the Last Year: Never true    Ran Out of Food in the Last Year: Never true  Transportation Needs: No Transportation Needs (04/13/2017)   PRAPARE - Hydrologist (Medical): No    Lack of Transportation (Non-Medical): No  Physical Activity: Insufficiently Active (04/13/2017)   Exercise Vital Sign    Days of Exercise per Week: 2 days    Minutes of Exercise per Session: 60 min  Stress: No Stress Concern Present (04/13/2017)   [place]    Feeling of Stress : Not at all  Social Connections: Somewhat Isolated (04/13/2017)   Social Connection and Isolation Panel [NHANES]    Frequency of Communication with Friends and Family: More than three times a week    Frequency of Social Gatherings with Friends and Family: Once a week    Attends Religious Services: 1 to 4 times per year    Active Member of [employer] or Organizations: No    Attends Music therapist: Never    Marital Status: Divorced    Family History  Problem Relation Age of Onset   Prostate cancer Brother 10       still living and well   Heart attack Father    Stroke Mother    Diabetes Mother    Hypertension Mother    Heart attack Brother    Pancreatic cancer Brother    Heart disease Sister    Breast cancer Neg Hx    Kidney cancer Neg Hx    Bladder Cancer Neg Hx     Past Medical History:  Diagnosis Date   A-fib ([place])    Abnormal Pap smear of vagina  and vaginal HPV    Acute diastolic CHF (congestive heart failure) ([place]) 01/21/2015   Acute diverticulitis 01/28/2015   Acute on chronic diastolic heart failure ([place]) 01/21/2015   Anticoagulation adequate 08/02/2014   Overview:  Xarelto 20hs; CHADS=1+ htn, female, age  Overview:  Xarelto 20hs; CHADS=1+ htn, female, age   Arthritis    "bones ache" (01/23/2015)   Atrial fibrillation, persistent ([place]) 08/13/2015   Overview:  symptomatic (fatigue, DOE, decreased exercise tolerance, palpitations), drug refractory (propafenone, amiodarone), complicated recovery from DCCV possibly related to repiratory obstruction [facility] (patient fear of DCCV)  Overview:  symptomatic (fatigue, DOE, decreased exercise tolerance, palpitations), drug refractory (propafenone, amiodarone), complicated recovery from DCCV possibly relat   Benign neoplasm of ascending colon    Benign neoplasm of descending colon    Benign neoplasm of sigmoid colon    Blood in stool 01/21/2015   Breath shortness 08/20/2013   CAFL (chronic airflow limitation) ([place]) 12/16/2014   Cancer ([place])    vulvar cancer   CCF (congestive cardiac failure) ([place]) 08/03/2013   Overview:  ACUTE SYSTOLIC    Chest pain [id number]   CHF (congestive heart failure) (HCC)    CHF (congestive heart failure) (HCC)    Chronic anticoagulation    She is on Xarelto for Afib   Chronic atrial fibrillation (HCC)    Chronic combined systolic and diastolic CHF, NYHA class 3 ([place]) 01/28/2015  Chronic diastolic heart failure ([place]) 05/05/2015   Condyloma acuminatum of vulva 08/02/2014   COPD (chronic obstructive pulmonary disease) ([place]) 08/13/2015   Diverticulitis large intestine w/o perforation or abscess w/bleeding 01/28/2015   Diverticulitis of large intestine without perforation or abscess with bleeding    Dysmetabolic syndrome [id number]   Overview:  BG-166 06/15/2013; elevated BMI; central obesity    Dyspnea    Echocardiogram abnormal    Mitral regurgitation,  tricuspic regurgitation   Essential hypertension 10/07/2014   Trial off acei 10/07/2014  Due to atypical sob (talking = walking)> no real change 11/18/2014     Family history of adverse reaction to anesthesia    sister gets very nauseated and vomits   Gastritis without bleeding    GERD (gastroesophageal reflux disease)    History of kidney stones    History of stress test    Myoview 8/16: EF 42%, anteroseptal, inferoseptal, anterior, apical anterior and apical defect (likely represents breast attenuation versus scar), no ischemia; intermediate risk because of low EF   HPV (human papilloma virus) anogenital infection    03/21/12 PAP + HR HPV   HTN (hypertension) 12/16/2014   Hypertension    Hypertensive heart disease 05/12/2016   Hypertensive urgency 01/21/2015   Insulin resistance    Kidney stones    Malignant neoplasm of vulva ([place]) 06/19/2014   Overview:  Ms JAKAYLA SHAMES is a 77 yo who was referred by Dr Ouida Sills for a vulvar lesion. 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 The specimen had negative margins, but was within 2 mm of the cancer.     Mitral regurgitation 04/23/2013   Overview:  "Moderate to severe" by TTE, "mild to mod" by TEE  Overview:  "Moderate to severe" by TTE, "mild to mod" by TEE   Obesity    OSA (obstructive sleep apnea) 11/27/2014   07/2013 RDI 17/h CPAP 8    Other specified diseases of esophagus    Postoperative nausea and vomiting 1983   many years ago with hysterectomy   PVC (premature ventricular contraction) 12/16/2014   TI (tricuspid incompetence) 04/23/2013   Overview:  "moderate" by TTE    Urolithiasis    Vertigo    Vulvar cancer ([place]) 2016   invasive verrucous carcinoma of the vulva. History of genital warts and vulvar condyloma   Vulvar cancer, carcinoma ([place]) 05/10/2014    Past Surgical History:  Procedure Laterality Date   ABDOMINAL HYSTERECTOMY      APPENDECTOMY     BILATERAL SALPINGOOPHORECTOMY  2003   benign ovarian cancer   CARDIOVERSION  2014-2015   "[facility]"   COLONOSCOPY WITH PROPOFOL N/A 06/10/2015   Procedure: COLONOSCOPY WITH PROPOFOL;  Surgeon: Lucilla Lame, MD;  Location: [facility] ENDOSCOPY;  Service: Endoscopy;  Laterality: N/A;   CORONARY STENT INTERVENTION N/A 01/18/2019   Procedure: CORONARY STENT INTERVENTION;  Surgeon: Jettie Booze, MD;  Location: [facility] [facility];  Service: Cardiovascular;  Laterality: N/A;   CYSTOSCOPY W/ RETROGRADES Bilateral 10/19/2016   Procedure: CYSTOSCOPY WITH RETROGRADE PYELOGRAM;  Surgeon: Hollice Espy, MD;  Location: [facility] ORS;  Service: Urology;  Laterality: Bilateral;   CYSTOSCOPY W/ URETERAL STENT PLACEMENT Left 12/29/2016   Procedure: CYSTOSCOPY WITH STENT REPLACEMENT;  Surgeon: Hollice Espy, MD;  Location: [facility] ORS;  Service: Urology;  Laterality: Left;   CYSTOSCOPY WITH BIOPSY N/A 10/19/2016   Procedure: CYSTOSCOPY WITH BLADDER BIOPSY, VAGINAL WALL BIOPSY;  Surgeon: Hollice Espy, MD;  Location: [facility] ORS;  Service: Urology;  Laterality: N/A;   CYSTOSCOPY WITH STENT PLACEMENT Left 12/14/2016   Procedure: CYSTOSCOPY WITH STENT PLACEMENT;  Surgeon: Hollice Espy, MD;  Location: [facility] ORS;  Service: Urology;  Laterality: Left;   ESOPHAGOGASTRODUODENOSCOPY (EGD) WITH PROPOFOL N/A 10/05/2016   Surgeon: Lucilla Lame, MD;  Location: [facility] ENDOSCOPY;  Results: Barrett's Esophagus- repeat in 3 years 09/2019   LEFT HEART CATH AND CORONARY ANGIOGRAPHY N/A 01/18/2019   Procedure: LEFT HEART CATH AND CORONARY ANGIOGRAPHY;  Surgeon: Jettie Booze, MD;  Location: [place];  Service: Cardiovascular;  Laterality: N/A;   LITHOTRIPSY     STONE EXTRACTION WITH BASKET Left 12/29/2016   Procedure: STONE EXTRACTION WITH BASKET;  Surgeon: Hollice Espy, MD;  Location: [facility] ORS;  Service: Urology;  Laterality: Left;   TONSILLECTOMY     URETEROSCOPY Left 12/29/2016   Procedure:  URETEROSCOPY;  Surgeon: Hollice Espy, MD;  Location: [facility] ORS;  Service: Urology;  Laterality: Left;   VULVECTOMY Right 05/10/2014   Excisional biopsy of the superior right labial majora mass; [place] PARTIAL  07/02/2014   Re-excision and sentinel node dissection at [facility].      Current Outpatient Medications on File Prior to Visit  Medication Sig Dispense Refill   BIOTIN PO Take 1 tablet by mouth daily.      Cholecalciferol (VITAMIN D3) 1000 units CAPS Take by mouth.     diclofenac Sodium (VOLTAREN) 1 % GEL Apply topically.     FARXIGA 10 MG TABS tablet Take 10 mg by mouth daily.     fluticasone (FLONASE) 50 MCG/ACT nasal spray Place 2 sprays into both nostrils daily. 16 g 6   irbesartan (AVAPRO) 75 MG tablet TAKE 1 TABLET BY MOUTH EVERY DAY 90 tablet 2   metoprolol tartrate (LOPRESSOR) 100 MG tablet TAKE 1 & 1/2 TABS ('150MG'$ ) BY MOUTH 2 (TWO) TIMES DAILY. 270 tablet 2   nitroGLYCERIN (NITROSTAT) 0.4 MG SL tablet Place 1 tablet (0.4 mg total) under the tongue every 5 (five) minutes as needed. 25 tablet 2   ONETOUCH VERIO test strip 1 each 3 (three) times daily.     pantoprazole (PROTONIX) 40 MG tablet Take 1 tablet (40 mg total) by mouth daily. 90 tablet 2   Rivaroxaban (XARELTO) 15 MG TABS tablet Take 1 tablet (15 mg total) by mouth daily with supper. 90 tablet 3   torsemide 40 MG TABS Take 40 mg by mouth daily. 90 tablet 3   TOVIAZ 4 MG TB24 tablet Take 4 mg by mouth daily.     traZODone (DESYREL) 50 MG tablet TAKE 0.5-1 TABLETS BY MOUTH AT BEDTIME AS NEEDED FOR SLEEP. 90 tablet 2   TRULICITY 1.5 0000000 SOPN Inject into the skin.     vitamin B-12 (CYANOCOBALAMIN) 1000 MCG tablet Take 1,000 mcg by mouth every other day.      [DISCONTINUED] potassium chloride SA (K-DUR,KLOR-CON) 20 MEQ tablet Take 20 mEq by mouth daily.     Current Facility-Administered Medications on File Prior to Visit  Medication Dose Route Frequency Provider Last Rate Last Admin   albuterol  (PROVENTIL) (2.5 MG/3ML) 0.083% nebulizer solution 2.5 mg  2.5 mg Nebulization Once Juline Patch, MD        Allergies  Allergen Reactions   Ciprofloxacin Other (See Comments)    SOB     DIAGNOSTIC DATA (LABS, IMAGING, TESTING) - I reviewed patient records, labs, notes, testing and imaging myself where available.  Lab Results  Component  Value Date   WBC 8.2 01/15/2019   HGB 13.7 01/15/2019   HCT 41.8 01/15/2019   MCV 95 01/15/2019   PLT 247 01/15/2019      Component Value Date/Time   NA 138 03/26/2020 0000   NA 138 06/23/2013 0425   K 4.9 03/26/2020 0000   K 3.8 06/23/2013 0425   CL 99 03/26/2020 0000   CL 101 06/23/2013 0425   CO2 20 03/26/2020 0000   CO2 32 06/23/2013 0425   GLUCOSE 152 (H) 03/26/2020 0000   GLUCOSE 178 (H) 02/05/2019 1315   GLUCOSE 130 (H) 06/23/2013 0425   BUN 30 (H) 03/26/2020 0000   BUN 24 (H) 06/23/2013 0425   CREATININE 1.72 (H) 03/26/2020 0000   CREATININE 1.07 (H) 02/07/2015 1204   CALCIUM 10.0 03/26/2020 0000   CALCIUM 9.0 06/23/2013 0425   PROT 7.7 02/05/2019 1315   PROT 7.4 11/09/2017 1111   ALBUMIN 3.8 02/05/2019 1315   ALBUMIN 4.2 11/09/2017 1111   AST 25 02/05/2019 1315   ALT 18 02/05/2019 1315   ALKPHOS 46 02/05/2019 1315   BILITOT 0.9 02/05/2019 1315   BILITOT 0.5 11/09/2017 1111   GFRNONAA 28 (L) 03/26/2020 0000   GFRNONAA 53 (L) 06/23/2013 0425   GFRAA 33 (L) 03/26/2020 0000   GFRAA >60 06/23/2013 0425   Lab Results  Component Value Date   CHOL 206 (H) 03/26/2020   HDL 32 (L) 03/26/2020   LDLCALC 112 (H) 03/26/2020   TRIG 358 (H) 03/26/2020   CHOLHDL 6.4 (H) 03/26/2020   Lab Results  Component Value Date   HGBA1C 6.6 (H) 12/15/2016   Lab Results  Component Value Date   VITAMINB12 362 01/21/2015   Lab Results  Component Value Date   TSH 1.470 11/09/2017    Has outside blood test: anticoagulation. [facility]. Diabetic.  02-2022 Creatinine 1.4,  GFR 38.  High cholesterol.  [id number], high triglyceride.       PHYSICAL EXAM:  Today's Vitals   05/25/22 1305  BP: 125/77  Pulse: 84  Weight: 204 lb (92.5 kg)  Height: 5' (1.524 m)   Body mass index is 39.84 kg/m.   Wt Readings from Last 3 Encounters:  05/25/22 204 lb (92.5 kg)  04/27/22 200 lb (90.7 kg)  12/14/21 201 lb (91.2 kg)     Ht Readings from Last 3 Encounters:  05/25/22 5' (1.524 m)  04/27/22 5' (1.524 m)  12/14/21 5' (1.524 m)      General: The patient is awake, alert and appears not in acute distress. The patient is well groomed. Head: Normocephalic, atraumatic. Neck is supple.  Mallampati 3,  neck circumference:17.5 inches . Nasal airflow patent.  Retrognathia is  seen.  Dental status: biological,  poor dentition.  Cardiovascular:  Regular rate and cardiac rhythm by pulse,  without distended neck veins. Respiratory: Lungs are clear to auscultation.  Skin:  Without evidence of ankle edema, but signs of previous edema present.  Trunk: The patient's posture is erect.   NEUROLOGIC EXAM: The patient is awake and alert, oriented to place and time.   Memory subjective described as intact.  Attention span & concentration ability appears normal.  Speech is fluent,  with dysphonia. Mood and affect are appropriate.   Cranial nerves: no loss of smell or taste reported  Pupils are equal in size, round and briskly reactive to light, direct and consensual . Funduscopic exam deferred. Status post lens implant,  Extraocular movements in vertical and horizontal planes were intact and without  nystagmus. No Diplopia. Visual fields by finger perimetry are intact. Hearing was intact to soft voice and finger rubbing.    Facial sensation intact to fine touch.  Facial motor strength is symmetric and tongue and uvula move midline.  Neck ROM : rotation, tilt and flexion extension were normal for age and shoulder shrug was symmetrical.    Motor exam:  Symmetric bulk, tone and ROM.   Normal tone without cog wheeling, symmetric grip  strength .   Sensory:  Fine touch  and vibration were felt in both ankles.  Proprioception tested in the upper extremities was normal.   Coordination: Rapid alternating movements in the fingers/hands were of normal speed.  The Finger-to-nose maneuver was intact without evidence of ataxia, dysmetria or tremor.   Gait and station: Patient could rise unassisted from a seated position, walked without assistive device.  Stance is of wider base and the patient turned with 4 steps.  Toe and heel walk were deferred.  Deep tendon reflexes: in the  upper and lower extremities are symmetric and intact.  Babinski response was deferred .    ASSESSMENT AND PLAN:  here to transfer her CPAP care.  She has dyspnea.  Holly Smith is a 77 y.o. female with a hx of chronic atrial fibrillation, h/o tachycardia mediated cardiomyopathy in 2015 with improved EF, chronic diastolic heart failure, CAD, HLD, HTN and DM , CKD 3.    0) OSA on CPAP , complaint for many years, on a recalled machine by [employer]. Good resolution of apnea. Needs a baseline and a new machine if indicated.   1) Patient with history of tachycardia mediated cardiomyopathy in 2015.  She was converted.  Later had recurrent A-fib RVR.  Dr. Already not felt good candidate for ablation.  EF recovered on follow-up echocardiogram in 2016.  Patient on rate control strategy. Patient is anticoagulated .  Patient has failed propafenone and amiodarone in the past.   2) Cardiac catheterization in October 2020 showed proximal and mid distal LAD stenosis.  Both lesion treated with PCI and DES.   3) Nocturia on diuretics, 40 mg lasix a day. No edema today.   4) GERD with dysphonia?. Coughing frequently , feeling congested .    I will order a HST 5 WatchPAT as I do not have a reasonable timeframe to get the patient into an attended sleep study with in the lab.  This home sleep test should be done on a night without CPAP.  I  consider this patient likely to be CPAP dependent and she may have a miserable night.  I would encourage her to try 4 hours of recording time without CPAP if she absolutely cannot sleep without CPAP I will ask her to put it on but give me a note at which time during the night this happened.  My goal is to see a baseline with a by now recovered ejection fraction according to her cardiology note.    She still has some dyspnea, she is on high doses of diuretics which could explain her nocturia.  She does not have any chest pain or dizziness here today. I will replace her CPAP as soon as possible as it is a recalled model!  A REM sleep dependent hypopnea is not treatable by inspire or dental device. Her BMI also excludes this.   I plan to follow up either personally or through our NP within 3-5 months.   I would like to thank Shelda Pal,  DO and Shelda Pal, [address] [place],  [place] [address] for allowing me to meet with and to take care of this pleasant patient.   CC: I will share my notes with Dr Irish Lack .  After spending a total time of  45  minutes face to face and additional time for physical and neurologic examination, review of laboratory studies,  personal review of imaging studies, reports and results of other testing and review of referral information / records as far as provided in visit,   Electronically signed by: Larey Seat, MD 05/25/2022 1:09 PM  [facility] Board certified by The [employer] of Sleep Medicine and Diplomate of the [employer] of Sleep Medicine. Board certified In Neurology through the [place], Fellow of the [employer] of Neurology. Medical Director of [facility].

## 2022-05-25 NOTE — Progress Notes (Signed)
Chief Complaint  Patient presents with   Follow-up    Subjective: Patient is a 77 y.o. female here for f/u.  Started on trazodone nightly for insomnia. Discussed sleep hygiene and rec'd counseling. Since that time, doing very well sleeping. Has not noticed any side effects. Is not following with a counselor/therapist.   Past Medical History:  Diagnosis Date   A-fib (HCC)    Abnormal Pap smear of vagina and vaginal HPV    Acute diastolic CHF (congestive heart failure) ([place]) 01/21/2015   Acute diverticulitis 01/28/2015   Acute on chronic diastolic heart failure ([place]) 01/21/2015   Anticoagulation adequate 08/02/2014   Overview:  Xarelto 20hs; CHADS=1+ htn, female, age  Overview:  Xarelto 20hs; CHADS=1+ htn, female, age   Arthritis    "bones ache" (01/23/2015)   Atrial fibrillation, persistent ([place]) 08/13/2015   Overview:  symptomatic (fatigue, DOE, decreased exercise tolerance, palpitations), drug refractory (propafenone, amiodarone), complicated recovery from DCCV possibly related to repiratory obstruction [facility] (patient fear of DCCV)  Overview:  symptomatic (fatigue, DOE, decreased exercise tolerance, palpitations), drug refractory (propafenone, amiodarone), complicated recovery from DCCV possibly relat   Benign neoplasm of ascending colon    Benign neoplasm of descending colon    Benign neoplasm of sigmoid colon    Blood in stool 01/21/2015   Breath shortness 08/20/2013   CAFL (chronic airflow limitation) (HCC) 12/16/2014   Cancer (HCC)    vulvar cancer   CCF (congestive cardiac failure) ([place]) 08/03/2013   Overview:  ACUTE SYSTOLIC    Chest pain [id number]   CHF (congestive heart failure) (HCC)    CHF (congestive heart failure) (HCC)    Chronic anticoagulation    She is on Xarelto for Afib   Chronic atrial fibrillation (HCC)    Chronic combined systolic and diastolic CHF, NYHA class 3 ([place]) 01/28/2015   Chronic diastolic heart failure ([place]) 05/05/2015   Condyloma acuminatum of  vulva 08/02/2014   COPD (chronic obstructive pulmonary disease) ([place]) 08/13/2015   Diverticulitis large intestine w/o perforation or abscess w/bleeding 01/28/2015   Diverticulitis of large intestine without perforation or abscess with bleeding    Dysmetabolic syndrome [id number]   Overview:  BG-166 06/15/2013; elevated BMI; central obesity    Dyspnea    Echocardiogram abnormal    Mitral regurgitation, tricuspic regurgitation   Essential hypertension 10/07/2014   Trial off acei 10/07/2014  Due to atypical sob (talking = walking)> no real change 11/18/2014     Family history of adverse reaction to anesthesia    sister gets very nauseated and vomits   Gastritis without bleeding    GERD (gastroesophageal reflux disease)    History of kidney stones    History of stress test    Myoview 8/16: EF 42%, anteroseptal, inferoseptal, anterior, apical anterior and apical defect (likely represents breast attenuation versus scar), no ischemia; intermediate risk because of low EF   HPV (human papilloma virus) anogenital infection    03/21/12 PAP + HR HPV   HTN (hypertension) 12/16/2014   Hypertension    Hypertensive heart disease 05/12/2016   Hypertensive urgency 01/21/2015   Insulin resistance    Kidney stones    Malignant neoplasm of vulva ([place]) 06/19/2014   Overview:  Ms RIYANA FLAMING is a 77 yo who was referred by Dr Ouida Sills for a vulvar lesion. 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 The specimen had negative margins, but was within 2 mm of the cancer.  Mitral regurgitation 04/23/2013   Overview:  "Moderate to severe" by TTE, "mild to mod" by TEE  Overview:  "Moderate to severe" by TTE, "mild to mod" by TEE   Obesity    OSA (obstructive sleep apnea) 11/27/2014   07/2013 RDI 17/h CPAP 8    Other specified diseases of esophagus    Postoperative nausea and vomiting 1983   many years ago with hysterectomy   PVC  (premature ventricular contraction) 12/16/2014   TI (tricuspid incompetence) 04/23/2013   Overview:  "moderate" by TTE    Urolithiasis    Vertigo    Vulvar cancer ([place]) 2016   invasive verrucous carcinoma of the vulva. History of genital warts and vulvar condyloma   Vulvar cancer, carcinoma ([place]) 05/10/2014    Objective: BP 124/78 (BP Location: Left Arm, Patient Position: Sitting, Cuff Size: Normal)   Pulse 92   Temp 98.1 F (36.7 C) (Oral)   Ht 5' (1.524 m)   Wt 203 lb 6 oz (92.3 kg)   SpO2 93%   BMI 39.72 kg/m  General: Awake, appears stated age Heart: Reg rate, irreg irreg rhythm Lungs: CTAB, no rales, wheezes or rhonchi. No accessory muscle use Psych: Age appropriate judgment and insight, normal affect and mood  Assessment and Plan: Insomnia, unspecified type - Plan: traZODone (DESYREL) 50 MG tablet  Chronic, stable. Cont trazodone 50 mg/d. F/u in 3 mo for DM visit.  The patient voiced understanding and agreement to the plan.  [place], DO 05/25/22  4:27 PM

## 2022-05-25 NOTE — Patient Instructions (Signed)
Scheduled your colonoscopy.   [phone] to schedule your mammogram.   Thank you for scheduling your eye exam.   Let us know if you need anything.

## 2022-05-25 NOTE — Patient Instructions (Signed)
Quality Sleep Information, Adult Quality sleep is important for your mental and physical health. It also improves your quality of life. Quality sleep means you: Are asleep for most of the time you are in bed. Fall asleep within 30 minutes. Wake up no more than once a night. Are awake for no longer than 20 minutes if you do wake up during the night. Most adults need 7-8 hours of quality sleep each night. How can poor sleep affect me? If you do not get enough quality sleep, you may have: Mood swings. Daytime sleepiness. Decreased alertness, reaction time, and concentration. Sleep disorders, such as insomnia and sleep apnea. Difficulty with: Solving problems. Coping with stress. Paying attention. These issues may affect your performance and productivity at work, school, and home. Lack of sleep may also put you at higher risk for accidents, suicide, and risky behaviors. If you do not get quality sleep, you may also be at higher risk for several health problems, including: Infections. Type 2 diabetes. Heart disease. High blood pressure. Obesity. Worsening of long-term conditions, like arthritis, kidney disease, depression, Parkinson's disease, and epilepsy. What actions can I take to get more quality sleep? Sleep schedule and routine Stick to a sleep schedule. Go to sleep and wake up at about the same time each day. Do not try to sleep less on weekdays and make up for lost sleep on weekends. This does not work. Limit naps during the day to 30 minutes or less. Do not take naps in the late afternoon. Make time to relax before bed. Reading, listening to music, or taking a hot bath promotes quality sleep. Make your bedroom a place that promotes quality sleep. Keep your bedroom dark, quiet, and at a comfortable room temperature. Make sure your bed is comfortable. Avoid using electronic devices that give off bright blue light for 30 minutes before bedtime. Your brain perceives bright blue light  as sunlight. This includes television, phones, and computers. If you are lying awake in bed for longer than 20 minutes, get up and do a relaxing activity until you feel sleepy. Lifestyle     Try to get at least 30 minutes of exercise on most days. Do not exercise 2-3 hours before going to bed. Do not use any products that contain nicotine or tobacco. These products include cigarettes, chewing tobacco, and vaping devices, such as e-cigarettes. If you need help quitting, ask your health care provider. Do not drink caffeinated beverages for at least 8 hours before going to bed. Coffee, tea, and some sodas contain caffeine. Do not drink alcohol or eat large meals close to bedtime. Try to get at least 30 minutes of sunlight every day. Morning sunlight is best. Medical concerns Work with your health care provider to treat medical conditions that may affect sleeping, such as: Nasal obstruction. Snoring. Sleep apnea and other sleep disorders. Talk to your health care provider if you think any of your prescription medicines may cause you to have difficulty falling or staying asleep. If you have sleep problems, talk with a sleep consultant. If you think you have a sleep disorder, talk with your health care provider about getting evaluated by a specialist. Where to find more information Sleep Foundation: sleepfoundation.org American Academy of Sleep Medicine: aasm.org Centers for Disease Control and Prevention (CDC): StoreMirror.com.cy Contact a health care provider if: You have trouble getting to sleep or staying asleep. You often wake up very early in the morning and cannot get back to sleep. You have daytime sleepiness. You  have daytime sleep attacks of suddenly falling asleep and sudden muscle weakness (narcolepsy). You have a tingling sensation in your legs with a strong urge to move your legs (restless legs syndrome). You stop breathing briefly during sleep (sleep apnea). You think you have a sleep  disorder or are taking a medicine that is affecting your quality of sleep. Summary Most adults need 7-8 hours of quality sleep each night. Getting enough quality sleep is important for your mental and physical health. Make your bedroom a place that promotes quality sleep, and avoid things that may cause you to have poor sleep, such as alcohol, caffeine, smoking, or large meals. Talk to your health care provider if you have trouble falling asleep or staying asleep. This information is not intended to replace advice given to you by your health care provider. Make sure you discuss any questions you have with your health care provider. Document Revised: 07/01/2021 Document Reviewed: 07/01/2021 Elsevier Patient Education  [place].

## 2022-05-29 DIAGNOSIS — H524 Presbyopia: Secondary | ICD-10-CM | POA: Diagnosis not present

## 2022-06-03 NOTE — Progress Notes (Deleted)
Cardiology Office Note   Date:  06/03/2022   ID:  Holly Smith, Holly Smith Aug 02, 1945, MRN [id number]  PCP:  Shelda Pal, DO    No chief complaint on file.  CAD  Wt Readings from Last 3 Encounters:  05/25/22 203 lb 6 oz (92.3 kg)  05/25/22 204 lb (92.5 kg)  04/27/22 200 lb (90.7 kg)       History of Present Illness: Holly Smith is a 77 y.o. female  Who has chronic atrial fibrillation that is now rate controlled and h/o tachycardia mediated cardiomyopathy in 2015.  She was cardioverted at [facility]. EF later improved but she has chronic diastolic dysfunction. Last echo was in 2016. EF was 55-60%. Mild MR noted. She is on Xarelto for a/c.  She went back into atrial fibrillation several years ago which is now chronic.  Previously seen by Dr. Rayann Heman and not felt to be a good candidate for ablation.  She has failed propafenone  and amiodarone in the past.  She is on metoprolol for rate control. No h/o CAD. Other medical problems include HTN, HLD and obesity.   In 2019: "We discussed weight loss surgery referral.  She had a friend who died many years ago during weight loss surgery so she is hesitant.  She prefers the Atkins diet to try to lose weight.  She has done well with this in the past. "   She developed symptoms concerning for coronary artery disease and underwent cardiac catheterization in October 2020 showing: "  Prox Cx lesion is 50% stenosed. More distal mid LAD-1 lesion is 80% stenosed. A drug-eluting stent was successfully placed using a STENT SYNERGY DES 2.25X28. Post intervention, there is a 0% residual stenosis. More proximal mid LAD-2 lesion is 80% stenosed. A drug-eluting stent was successfully placed using a STENT SYNERGY DES 2.75X20 postdilated to 3.0 mm. Post intervention, there is a 0% residual stenosis. Mid RCA lesion is 25% stenosed. Acute Mrg lesion is 75% stenosed. This is a small vessel. RPDA lesion is 70% stenosed. This is a small  vessel. The left ventricular systolic function is normal. LV end diastolic pressure is moderately elevated. LVEDP 26 mm Hg. The left ventricular ejection fraction is 55-65% by visual estimate. There is no aortic valve stenosis.   Restart Xarelto tomorrow.  Continue Plavix 75 mg daily in addition to Xarelto.  To reduce bleeding risk, will hold off on continuing aspirin. "   After the catheterization, she lost weight.    At the 5/21 visit:, "she had stopped her cholesterol medicine due to cramps in her hands and feet.  She also reports that she falls asleep easily during the day when she is not trying to.  She is not interested in the Covid vaccine."  Crestor was added back 3x/week.  She is taking it 1-2x/week.  She was diagnosed with DM.  Metformin was started.  Also added [place].   She developed COVID in mid December and was treated with monoclonal antibody.  She was unvaccinated.   Dizziness prompted ER visit on 03/17/20.  Head and neck CT showed: "No hemodynamically significant stenosis, large vessel cut or aneurysms in intracranial circulation 2. No hemodynamically significant stenosis, dissection or aneurysms in extracranial circulation. 3. Scattered patchy opacities in visualized upper lungs may represent infiltrates. Please correlate with signs and symptoms. 4. Incidental note is made of soft tissue prominence at the base of tongue that is abutting the anterior aspect of epiglottis (image 247 series 7). Differential diagnoses  include enlarged lingual tonsils or possibly base of tongue mass. Further workup/follow-up and direct visualization can be considered."  AFib was stable.   Given Bactrim for cystitis and meclizine for dizziness.    Diuretic was reduced in January 2022 due to dizziness and to prevent dehydration.   She went back to the two pills after having volume overload.  SHe was hospitalized at [facility]. She tested positive for COVID at that time as well.     Past Medical  History:  Diagnosis Date   A-fib (HCC)    Abnormal Pap smear of vagina and vaginal HPV    Acute diastolic CHF (congestive heart failure) ([place]) 01/21/2015   Acute diverticulitis 01/28/2015   Acute on chronic diastolic heart failure ([place]) 01/21/2015   Anticoagulation adequate 08/02/2014   Overview:  Xarelto 20hs; CHADS=1+ htn, female, age  Overview:  Xarelto 20hs; CHADS=1+ htn, female, age   Arthritis    "bones ache" (01/23/2015)   Atrial fibrillation, persistent ([place]) 08/13/2015   Overview:  symptomatic (fatigue, DOE, decreased exercise tolerance, palpitations), drug refractory (propafenone, amiodarone), complicated recovery from DCCV possibly related to repiratory obstruction [facility] (patient fear of DCCV)  Overview:  symptomatic (fatigue, DOE, decreased exercise tolerance, palpitations), drug refractory (propafenone, amiodarone), complicated recovery from DCCV possibly relat   Benign neoplasm of ascending colon    Benign neoplasm of descending colon    Benign neoplasm of sigmoid colon    Blood in stool 01/21/2015   Breath shortness 08/20/2013   CAFL (chronic airflow limitation) (HCC) 12/16/2014   Cancer (HCC)    vulvar cancer   CCF (congestive cardiac failure) ([place]) 08/03/2013   Overview:  ACUTE SYSTOLIC    Chest pain [id number]   CHF (congestive heart failure) (HCC)    CHF (congestive heart failure) (HCC)    Chronic anticoagulation    She is on Xarelto for Afib   Chronic atrial fibrillation (HCC)    Chronic combined systolic and diastolic CHF, NYHA class 3 ([place]) 01/28/2015   Chronic diastolic heart failure ([place]) 05/05/2015   Condyloma acuminatum of vulva 08/02/2014   COPD (chronic obstructive pulmonary disease) ([place]) 08/13/2015   Diverticulitis large intestine w/o perforation or abscess w/bleeding 01/28/2015   Diverticulitis of large intestine without perforation or abscess with bleeding    Dysmetabolic syndrome 123456   Overview:  BG-166 06/15/2013; elevated BMI; central obesity     Dyspnea    Echocardiogram abnormal    Mitral regurgitation, tricuspic regurgitation   Essential hypertension 10/07/2014   Trial off acei 10/07/2014  Due to atypical sob (talking = walking)> no real change 11/18/2014     Family history of adverse reaction to anesthesia    sister gets very nauseated and vomits   Gastritis without bleeding    GERD (gastroesophageal reflux disease)    History of kidney stones    History of stress test    Myoview 8/16: EF 42%, anteroseptal, inferoseptal, anterior, apical anterior and apical defect (likely represents breast attenuation versus scar), no ischemia; intermediate risk because of low EF   HPV (human papilloma virus) anogenital infection    03/21/12 PAP + HR HPV   HTN (hypertension) 12/16/2014   Hypertension    Hypertensive heart disease 05/12/2016   Hypertensive urgency 01/21/2015   Insulin resistance    Kidney stones    Malignant neoplasm of vulva ([place]) 06/19/2014   Overview:  Ms VANSHIKA CHUBB is a 77 yo who was referred by Dr Ouida Sills for a vulvar lesion. In 04/2014 Dr Ouida Sills did a wide  local excision, which demonstrated grade 1 verrucous carcinoma with 2.4 mm invasion. The specimen had negative margins, but was within 2 mm of the cancer.     Mitral regurgitation 04/23/2013   Overview:  "Moderate to severe" by TTE, "mild to mod" by TEE  Overview:  "Moderate to severe" by TTE, "mild to mod" by TEE   Obesity    OSA (obstructive sleep apnea) 11/27/2014   07/2013 RDI 17/h CPAP 8    Other specified diseases of esophagus    Postoperative nausea and vomiting 1983   many years ago with hysterectomy   PVC (premature ventricular contraction) 12/16/2014   TI (tricuspid incompetence) 04/23/2013   Overview:  "moderate" by TTE    Urolithiasis    Vertigo    Vulvar cancer ([place]) 2016   invasive verrucous carcinoma of the vulva. History of genital warts and vulvar condyloma   Vulvar cancer, carcinoma ([place]) 05/10/2014    Past Surgical History:   Procedure Laterality Date   ABDOMINAL HYSTERECTOMY     APPENDECTOMY     BILATERAL SALPINGOOPHORECTOMY  2003   benign ovarian cancer   CARDIOVERSION  2014-2015   "[facility]"   COLONOSCOPY WITH PROPOFOL N/A 06/10/2015   Procedure: COLONOSCOPY WITH PROPOFOL;  Surgeon: Lucilla Lame, MD;  Location: [facility] ENDOSCOPY;  Service: Endoscopy;  Laterality: N/A;   CORONARY STENT INTERVENTION N/A 01/18/2019   Procedure: CORONARY STENT INTERVENTION;  Surgeon: Jettie Booze, MD;  Location: [facility];  Service: Cardiovascular;  Laterality: N/A;   CYSTOSCOPY W/ RETROGRADES Bilateral 10/19/2016   Procedure: CYSTOSCOPY WITH RETROGRADE PYELOGRAM;  Surgeon: Hollice Espy, MD;  Location: [facility] ORS;  Service: Urology;  Laterality: Bilateral;   CYSTOSCOPY W/ URETERAL STENT PLACEMENT Left 12/29/2016   Procedure: CYSTOSCOPY WITH STENT REPLACEMENT;  Surgeon: Hollice Espy, MD;  Location: [facility] ORS;  Service: Urology;  Laterality: Left;   CYSTOSCOPY WITH BIOPSY N/A 10/19/2016   Procedure: CYSTOSCOPY WITH BLADDER BIOPSY, VAGINAL WALL BIOPSY;  Surgeon: Hollice Espy, MD;  Location: [facility] ORS;  Service: Urology;  Laterality: N/A;   CYSTOSCOPY WITH STENT PLACEMENT Left 12/14/2016   Procedure: CYSTOSCOPY WITH STENT PLACEMENT;  Surgeon: Hollice Espy, MD;  Location: [facility] ORS;  Service: Urology;  Laterality: Left;   ESOPHAGOGASTRODUODENOSCOPY (EGD) WITH PROPOFOL N/A 10/05/2016   Surgeon: Lucilla Lame, MD;  Location: [facility] ENDOSCOPY;  Results: Barrett's Esophagus- repeat in 3 years 09/2019   LEFT HEART CATH AND CORONARY ANGIOGRAPHY N/A 01/18/2019   Procedure: LEFT HEART CATH AND CORONARY ANGIOGRAPHY;  Surgeon: Jettie Booze, MD;  Location: [facility];  Service: Cardiovascular;  Laterality: N/A;   LITHOTRIPSY     STONE EXTRACTION WITH BASKET Left 12/29/2016   Procedure: STONE EXTRACTION WITH BASKET;  Surgeon: Hollice Espy, MD;  Location: [facility] ORS;  Service: Urology;  Laterality: Left;    TONSILLECTOMY     URETEROSCOPY Left 12/29/2016   Procedure: URETEROSCOPY;  Surgeon: Hollice Espy, MD;  Location: [facility] ORS;  Service: Urology;  Laterality: Left;   VULVECTOMY Right 05/10/2014   Excisional biopsy of the superior right labial majora mass; [place] PARTIAL  07/02/2014   Re-excision and sentinel node dissection at [facility].      Current Outpatient Medications  Medication Sig Dispense Refill   BIOTIN PO Take 1 tablet by mouth daily.      Cholecalciferol (VITAMIN D3) 1000 units CAPS Take by mouth.     diclofenac Sodium (VOLTAREN) 1 % GEL Apply topically.     FARXIGA 10 MG TABS tablet  Take 10 mg by mouth daily.     fluticasone (FLONASE) 50 MCG/ACT nasal spray Place 2 sprays into both nostrils daily. 16 g 6   irbesartan (AVAPRO) 75 MG tablet TAKE 1 TABLET BY MOUTH EVERY DAY 90 tablet 2   metoprolol tartrate (LOPRESSOR) 100 MG tablet TAKE 1 & 1/2 TABS ('150MG'$ ) BY MOUTH 2 (TWO) TIMES DAILY. 270 tablet 2   nitroGLYCERIN (NITROSTAT) 0.4 MG SL tablet Place 1 tablet (0.4 mg total) under the tongue every 5 (five) minutes as needed. 25 tablet 2   ONETOUCH VERIO test strip 1 each 3 (three) times daily.     pantoprazole (PROTONIX) 40 MG tablet Take 1 tablet (40 mg total) by mouth daily. 90 tablet 2   Rivaroxaban (XARELTO) 15 MG TABS tablet Take 1 tablet (15 mg total) by mouth daily with supper. 90 tablet 3   torsemide 40 MG TABS Take 40 mg by mouth daily. 90 tablet 3   TOVIAZ 4 MG TB24 tablet Take 4 mg by mouth daily.     traZODone (DESYREL) 50 MG tablet Take 1 tablet (50 mg total) by mouth at bedtime as needed for sleep. 90 tablet 2   TRULICITY 1.5 0000000 SOPN Inject into the skin.     vitamin B-12 (CYANOCOBALAMIN) 1000 MCG tablet Take 1,000 mcg by mouth every other day.      Current Facility-Administered Medications  Medication Dose Route Frequency Provider Last Rate Last Admin   albuterol (PROVENTIL) (2.5 MG/3ML) 0.083% nebulizer solution 2.5 mg  2.5 mg  Nebulization Once Juline Patch, MD        Allergies:   Ciprofloxacin    Social History:  The patient  reports that she quit smoking about 10 years ago. Her smoking use included cigarettes. She has a 50.00 pack-year smoking history. She has never used smokeless tobacco. She reports that she does not drink alcohol and does not use drugs.   Family History:  The patient's ***family history includes Diabetes in her mother; Heart attack in her brother and father; Heart disease in her sister; Hypertension in her mother; Pancreatic cancer in her brother; Prostate cancer (age of onset: 12) in her brother; Stroke in her mother.    ROS:  Please see the history of present illness.   Otherwise, review of systems are positive for ***.   All other systems are reviewed and negative.    PHYSICAL EXAM: VS:  There were no vitals taken for this visit. , BMI There is no height or weight on file to calculate BMI. GEN: Well nourished, well developed, in no acute distress HEENT: normal Neck: no JVD, carotid bruits, or masses Cardiac: ***RRR; no murmurs, rubs, or gallops,no edema  Respiratory:  clear to auscultation bilaterally, normal work of breathing GI: soft, nontender, nondistended, + BS MS: no deformity or atrophy Skin: warm and dry, no rash Neuro:  Strength and sensation are intact Psych: euthymic mood, full affect   EKG:   The ekg ordered today demonstrates ***   Recent Labs: No results found for requested labs within last 365 days.   Lipid Panel    Component Value Date/Time   CHOL 206 (H) 03/26/2020 0000   TRIG 358 (H) 03/26/2020 0000   HDL 32 (L) 03/26/2020 0000   CHOLHDL 6.4 (H) 03/26/2020 0000   CHOLHDL 2.9 02/05/2019 1315   VLDL 29 02/05/2019 1315   LDLCALC 112 (H) 03/26/2020 0000     Other studies Reviewed: Additional studies/ records that were reviewed today with results demonstrating: ***.  ASSESSMENT AND PLAN:  CAD:   Hyperlipidemia: Obesity: HTN: AFib: Anticoagulation: DM:   Current medicines are reviewed at length with the patient today.  The patient concerns regarding her medicines were addressed.  The following changes have been made:  No change***  Labs/ tests ordered today include: *** No orders of the defined types were placed in this encounter.   Recommend 150 minutes/week of aerobic exercise Low fat, low carb, high fiber diet recommended  Disposition:   FU in ***   Signed, Larae Grooms, MD  06/03/2022 6:03 PM    [facility] [place], [place], [place]  [address] Phone: [phone]; Fax: [phone]

## 2022-06-04 ENCOUNTER — Ambulatory Visit: Payer: Medicare HMO | Admitting: Interventional Cardiology

## 2022-06-04 DIAGNOSIS — E119 Type 2 diabetes mellitus without complications: Secondary | ICD-10-CM

## 2022-06-04 DIAGNOSIS — I25118 Atherosclerotic heart disease of native coronary artery with other forms of angina pectoris: Secondary | ICD-10-CM

## 2022-06-04 DIAGNOSIS — E782 Mixed hyperlipidemia: Secondary | ICD-10-CM

## 2022-06-04 DIAGNOSIS — I5042 Chronic combined systolic (congestive) and diastolic (congestive) heart failure: Secondary | ICD-10-CM

## 2022-06-04 DIAGNOSIS — I4821 Permanent atrial fibrillation: Secondary | ICD-10-CM

## 2022-06-04 DIAGNOSIS — Z7901 Long term (current) use of anticoagulants: Secondary | ICD-10-CM

## 2022-06-04 DIAGNOSIS — I10 Essential (primary) hypertension: Secondary | ICD-10-CM

## 2022-06-07 ENCOUNTER — Other Ambulatory Visit: Payer: Self-pay | Admitting: Interventional Cardiology

## 2022-06-08 ENCOUNTER — Other Ambulatory Visit: Payer: Self-pay | Admitting: Interventional Cardiology

## 2022-06-08 ENCOUNTER — Telehealth: Payer: Self-pay | Admitting: Neurology

## 2022-06-08 MED ORDER — TORSEMIDE 40 MG PO TABS: 40.0000 mg | ORAL_TABLET | Freq: Every day | ORAL | 1 refills | Status: DC

## 2022-06-08 NOTE — Telephone Encounter (Signed)
Pharmacy is stating that pt's medication torsemide needs a PA and would like to know if doctor wants to prescribe an alternative. Please address

## 2022-06-08 NOTE — Telephone Encounter (Signed)
05/26/22 [employer] medicare no Josem Kaufmann req EE  3/19:lvm-mla

## 2022-06-09 ENCOUNTER — Other Ambulatory Visit: Payer: Self-pay

## 2022-06-09 MED ORDER — TORSEMIDE 20 MG PO TABS: ORAL_TABLET | ORAL | 0 refills | Status: DC

## 2022-06-16 DIAGNOSIS — E119 Type 2 diabetes mellitus without complications: Secondary | ICD-10-CM | POA: Diagnosis not present

## 2022-06-18 ENCOUNTER — Ambulatory Visit: Payer: Medicare HMO | Attending: Interventional Cardiology | Admitting: Physician Assistant

## 2022-06-18 VITALS — BP 122/80 | HR 87 | Ht 60.0 in | Wt 204.2 lb

## 2022-06-18 DIAGNOSIS — Z7901 Long term (current) use of anticoagulants: Secondary | ICD-10-CM

## 2022-06-18 DIAGNOSIS — I25118 Atherosclerotic heart disease of native coronary artery with other forms of angina pectoris: Secondary | ICD-10-CM | POA: Diagnosis not present

## 2022-06-18 DIAGNOSIS — E119 Type 2 diabetes mellitus without complications: Secondary | ICD-10-CM

## 2022-06-18 DIAGNOSIS — I10 Essential (primary) hypertension: Secondary | ICD-10-CM

## 2022-06-18 DIAGNOSIS — I4821 Permanent atrial fibrillation: Secondary | ICD-10-CM

## 2022-06-18 DIAGNOSIS — I5032 Chronic diastolic (congestive) heart failure: Secondary | ICD-10-CM | POA: Diagnosis not present

## 2022-06-18 DIAGNOSIS — R42 Dizziness and giddiness: Secondary | ICD-10-CM | POA: Diagnosis not present

## 2022-06-18 DIAGNOSIS — E782 Mixed hyperlipidemia: Secondary | ICD-10-CM

## 2022-06-18 MED ORDER — MECLIZINE HCL 12.5 MG PO TABS: 12.5000 mg | ORAL_TABLET | Freq: Two times a day (BID) | ORAL | Status: DC | PRN

## 2022-06-18 NOTE — Progress Notes (Signed)
Office Visit    Patient Name: Holly Smith Date of Encounter: 06/18/2022  PCP:  Shelda Pal, [facility]  Cardiologist:  Larae Grooms, MD  Advanced Practice Provider:  No care team member to display Electrophysiologist:  None   HPI    Holly Smith is a 77 y.o. female with past medical history of chronic atrial fibrillation, tachycardia mediated cardiomyopathy in 2015 with improved EF, chronic diastolic heart failure, CAD's, HLD, HTN and diabetes mellitus presents today for follow-up appointment.  Patient with history of tachycardia mediated cardiomyopathy in 2015.  She was converted.  Later had recurrent A-fib with RVR.  Dr. Rayann Heman did not feel that she was a good candidate for ablation.  EF recovered on follow-up echocardiogram in 2016.  Patient on rate control strategy.  Patient had failed propafenone and amiodarone in the past.  Cardiac catheterization October 2020 showed proximal and mid distal LAD stenosis.  Lesion treated with PCI and DES.  Was last seen for follow-up 11/2021 and breathing and volume status were stable on torsemide 40 mg daily.  Recently her Xarelto dose was reduced to 15 mg based on her creatinine clearance.  Denied chest pain, dizziness, orthopnea, PND, syncope, lower extremity edema or melena.  Stable dyspnea.  Today, she tells me that she is having some dizziness.  She thinks it might be due to allergies.  When she looks down she gets really dizzy.  She does tell me that the room is spinning.  I think she is talking about vertigo.  She has had this diagnosis in the past.  She went to see a physical therapist for this down in [employer].  She states that it really helped her.  Would recommend going to see Anderson Malta again.  However, we did prescribe her meclizine today to take as needed for vertigo until she can get an appointment.  She has an upcoming appointment with her PCP and she will need  to get a full set of labs including lipid panel at that time.  She does also endorse some lower extremity edema today.    Reports no shortness of breath nor dyspnea on exertion. Reports no chest pain, pressure, or tightness. No orthopnea, PND. Reports no palpitations.   Past Medical History    Past Medical History:  Diagnosis Date   A-fib ([place])    Abnormal Pap smear of vagina and vaginal HPV    Acute diastolic CHF (congestive heart failure) ([place]) 01/21/2015   Acute diverticulitis 01/28/2015   Acute on chronic diastolic heart failure ([place]) 01/21/2015   Anticoagulation adequate 08/02/2014   Overview:  Xarelto 20hs; CHADS=1+ htn, female, age  Overview:  Xarelto 20hs; CHADS=1+ htn, female, age   Arthritis    "bones ache" (01/23/2015)   Atrial fibrillation, persistent ([place]) 08/13/2015   Overview:  symptomatic (fatigue, DOE, decreased exercise tolerance, palpitations), drug refractory (propafenone, amiodarone), complicated recovery from DCCV possibly related to repiratory obstruction [facility] (patient fear of DCCV)  Overview:  symptomatic (fatigue, DOE, decreased exercise tolerance, palpitations), drug refractory (propafenone, amiodarone), complicated recovery from DCCV possibly relat   Benign neoplasm of ascending colon    Benign neoplasm of descending colon    Benign neoplasm of sigmoid colon    Blood in stool 01/21/2015   Breath shortness 08/20/2013   CAFL (chronic airflow limitation) ([place]) 12/16/2014   Cancer (HCC)    vulvar cancer   CCF (congestive cardiac failure) ([place]) 08/03/2013   Overview:  ACUTE  SYSTOLIC    Chest pain [id number]   CHF (congestive heart failure) (HCC)    CHF (congestive heart failure) (HCC)    Chronic anticoagulation    She is on Xarelto for Afib   Chronic atrial fibrillation (HCC)    Chronic combined systolic and diastolic CHF, NYHA class 3 ([place]) 01/28/2015   Chronic diastolic heart failure ([place]) 05/05/2015   Condyloma acuminatum of vulva 08/02/2014   COPD  (chronic obstructive pulmonary disease) ([place]) 08/13/2015   Diverticulitis large intestine w/o perforation or abscess w/bleeding 01/28/2015   Diverticulitis of large intestine without perforation or abscess with bleeding    Dysmetabolic syndrome [id number]   Overview:  BG-166 06/15/2013; elevated BMI; central obesity    Dyspnea    Echocardiogram abnormal    Mitral regurgitation, tricuspic regurgitation   Essential hypertension 10/07/2014   Trial off acei 10/07/2014  Due to atypical sob (talking = walking)> no real change 11/18/2014     Family history of adverse reaction to anesthesia    sister gets very nauseated and vomits   Gastritis without bleeding    GERD (gastroesophageal reflux disease)    History of kidney stones    History of stress test    Myoview 8/16: EF 42%, anteroseptal, inferoseptal, anterior, apical anterior and apical defect (likely represents breast attenuation versus scar), no ischemia; intermediate risk because of low EF   HPV (human papilloma virus) anogenital infection    03/21/12 PAP + HR HPV   HTN (hypertension) 12/16/2014   Hypertension    Hypertensive heart disease 05/12/2016   Hypertensive urgency 01/21/2015   Insulin resistance    Kidney stones    Malignant neoplasm of vulva ([place]) 06/19/2014   Overview:  Ms LEXIUS BEX is a 77 yo who was referred by Dr Ouida Sills for a vulvar lesion. 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 The specimen had negative margins, but was within 2 mm of the cancer.     Mitral regurgitation 04/23/2013   Overview:  "Moderate to severe" by TTE, "mild to mod" by TEE  Overview:  "Moderate to severe" by TTE, "mild to mod" by TEE   Obesity    OSA (obstructive sleep apnea) 11/27/2014   07/2013 RDI 17/h CPAP 8    Other specified diseases of esophagus    Postoperative nausea and vomiting 1983   many years ago with hysterectomy   PVC (premature ventricular  contraction) 12/16/2014   TI (tricuspid incompetence) 04/23/2013   Overview:  "moderate" by TTE    Urolithiasis    Vertigo    Vulvar cancer ([place]) 2016   invasive verrucous carcinoma of the vulva. History of genital warts and vulvar condyloma   Vulvar cancer, carcinoma ([place]) 05/10/2014   Past Surgical History:  Procedure Laterality Date   ABDOMINAL HYSTERECTOMY     APPENDECTOMY     BILATERAL SALPINGOOPHORECTOMY  2003   benign ovarian cancer   CARDIOVERSION  2014-2015   "[facility]"   COLONOSCOPY WITH PROPOFOL N/A 06/10/2015   Procedure: COLONOSCOPY WITH PROPOFOL;  Surgeon: Lucilla Lame, MD;  Location: [facility] ENDOSCOPY;  Service: Endoscopy;  Laterality: N/A;   CORONARY STENT INTERVENTION N/A 01/18/2019   Procedure: CORONARY STENT INTERVENTION;  Surgeon: Jettie Booze, MD;  Location: [facility];  Service: Cardiovascular;  Laterality: N/A;   CYSTOSCOPY W/ RETROGRADES Bilateral 10/19/2016   Procedure: CYSTOSCOPY WITH RETROGRADE PYELOGRAM;  Surgeon: Hollice Espy, MD;  Location: [facility] ORS;  Service: Urology;  Laterality: Bilateral;   CYSTOSCOPY W/ URETERAL STENT PLACEMENT Left 12/29/2016   Procedure: CYSTOSCOPY WITH STENT REPLACEMENT;  Surgeon: Hollice Espy, MD;  Location: [facility] ORS;  Service: Urology;  Laterality: Left;   CYSTOSCOPY WITH BIOPSY N/A 10/19/2016   Procedure: CYSTOSCOPY WITH BLADDER BIOPSY, VAGINAL WALL BIOPSY;  Surgeon: Hollice Espy, MD;  Location: [facility] ORS;  Service: Urology;  Laterality: N/A;   CYSTOSCOPY WITH STENT PLACEMENT Left 12/14/2016   Procedure: CYSTOSCOPY WITH STENT PLACEMENT;  Surgeon: Hollice Espy, MD;  Location: [facility] ORS;  Service: Urology;  Laterality: Left;   ESOPHAGOGASTRODUODENOSCOPY (EGD) WITH PROPOFOL N/A 10/05/2016   Surgeon: Lucilla Lame, MD;  Location: [facility] ENDOSCOPY;  Results: Barrett's Esophagus- repeat in 3 years 09/2019   LEFT HEART CATH AND CORONARY ANGIOGRAPHY N/A 01/18/2019   Procedure: LEFT HEART CATH AND CORONARY  ANGIOGRAPHY;  Surgeon: Jettie Booze, MD;  Location: [facility];  Service: Cardiovascular;  Laterality: N/A;   LITHOTRIPSY     STONE EXTRACTION WITH BASKET Left 12/29/2016   Procedure: STONE EXTRACTION WITH BASKET;  Surgeon: Hollice Espy, MD;  Location: [facility] ORS;  Service: Urology;  Laterality: Left;   TONSILLECTOMY     URETEROSCOPY Left 12/29/2016   Procedure: URETEROSCOPY;  Surgeon: Hollice Espy, MD;  Location: [facility] ORS;  Service: Urology;  Laterality: Left;   VULVECTOMY Right 05/10/2014   Excisional biopsy of the superior right labial majora mass; [place] PARTIAL  07/02/2014   Re-excision and sentinel node dissection at [facility]     Allergies  Allergies  Allergen Reactions   Ciprofloxacin Other (See Comments)    SOB    EKGs/Labs/Other Studies Reviewed:   The following studies were reviewed today: Cath: 01/18/19   Prox Cx lesion is 50% stenosed. More distal mid LAD-1 lesion is 80% stenosed. A drug-eluting stent was successfully placed using a STENT SYNERGY DES 2.25X28. Post intervention, there is a 0% residual stenosis. More proximal mid LAD-2 lesion is 80% stenosed. A drug-eluting stent was successfully placed using a STENT SYNERGY DES 2.75X20 postdilated to 3.0 mm. Post intervention, there is a 0% residual stenosis. Mid RCA lesion is 25% stenosed. Acute Mrg lesion is 75% stenosed. This is a small vessel. RPDA lesion is 70% stenosed. This is a small vessel. The left ventricular systolic function is normal. LV end diastolic pressure is moderately elevated. LVEDP 26 mm Hg. The left ventricular ejection fraction is 55-65% by visual estimate. There is no aortic valve stenosis.   Restart Xarelto tomorrow.  Continue Plavix 75 mg daily in addition to Xarelto.  To reduce bleeding risk, will hold off on continuing aspirin.     She has a fair amount of volume overload as well.  Will need to increase diuretics at least temporarily after she  recovers from the cath.  This may help her breathing.    Continue aggressive secondary prevention.    Diagnostic Dominance: Right  Intervention       EKG:  EKG is  ordered today.  The ekg ordered today demonstrates atrial fibrillation, incomplete right bundle branch block, 87 bpm  Recent Labs: No results found for requested labs within last 365 days.  Recent Lipid Panel    Component Value Date/Time   CHOL 206 (H) 03/26/2020 0000   TRIG 358 (H) 03/26/2020 0000   HDL 32 (L) 03/26/2020 0000   CHOLHDL 6.4 (H) 03/26/2020 0000   CHOLHDL 2.9 02/05/2019 1315   VLDL 29 02/05/2019 1315   LDLCALC 112 (H) 03/26/2020 0000  Risk Assessment/Calculations:   CHA2DS2-VASc Score = 7   This indicates a 11.2% annual risk of stroke. The patient's score is based upon: CHF History: 1 HTN History: 1 Diabetes History: 1 Stroke History: 0 Vascular Disease History: 1 Age Score: 2 Gender Score: 1     Home Medications   Current Meds  Medication Sig   BIOTIN PO Take 1 tablet by mouth daily.    Cholecalciferol (VITAMIN D3) 1000 units CAPS Take by mouth.   diclofenac Sodium (VOLTAREN) 1 % GEL Apply topically.   FARXIGA 10 MG TABS tablet Take 10 mg by mouth daily.   fluticasone (FLONASE) 50 MCG/ACT nasal spray Place 2 sprays into both nostrils daily.   irbesartan (AVAPRO) 75 MG tablet TAKE 1 TABLET BY MOUTH EVERY DAY   metoprolol tartrate (LOPRESSOR) 100 MG tablet TAKE 1 & 1/2 TABS (150MG ) BY MOUTH 2 (TWO) TIMES DAILY.   nitroGLYCERIN (NITROSTAT) 0.4 MG SL tablet Place 1 tablet (0.4 mg total) under the tongue every 5 (five) minutes as needed.   ONETOUCH VERIO test strip 1 each 3 (three) times daily.   pantoprazole (PROTONIX) 40 MG tablet Take 1 tablet (40 mg total) by mouth daily.   Rivaroxaban (XARELTO) 15 MG TABS tablet Take 1 tablet (15 mg total) by mouth daily with supper.   torsemide (DEMADEX) 20 MG tablet Take 2 tablets (40 mg) PO daily   TOVIAZ 4 MG TB24 tablet Take 4 mg by mouth  daily.   traZODone (DESYREL) 50 MG tablet Take 1 tablet (50 mg total) by mouth at bedtime as needed for sleep.   TRULICITY 1.5 0000000 SOPN Inject into the skin.   vitamin B-12 (CYANOCOBALAMIN) 1000 MCG tablet Take 1,000 mcg by mouth every other day.    Current Facility-Administered Medications for the 06/18/22 encounter (Office Visit) with Elgie Collard, PA-C  Medication   albuterol (PROVENTIL) (2.5 MG/3ML) 0.083% nebulizer solution 2.5 mg   meclizine (ANTIVERT) tablet 12.5 mg     Review of Systems      All other systems reviewed and are otherwise negative except as noted above.  Physical Exam    VS:  BP 122/80   Pulse 87   Ht 5' (1.524 m)   Wt 204 lb 3.2 oz (92.6 kg)   SpO2 97%   BMI 39.88 kg/m  , BMI Body mass index is 39.88 kg/m.  Wt Readings from Last 3 Encounters:  06/18/22 204 lb 3.2 oz (92.6 kg)  05/25/22 203 lb 6 oz (92.3 kg)  05/25/22 204 lb (92.5 kg)     GEN: Well nourished, well developed, in no acute distress. HEENT: normal. Neck: Supple, no JVD, carotid bruits, or masses. Cardiac: Irregularly irregular, no murmurs, rubs, or gallops. No clubbing, cyanosis, edema.  Radials/PT 2+ and equal bilaterally.  Respiratory:  Respirations regular and unlabored, clear to auscultation bilaterally. GI: Soft, nontender, nondistended. MS: No deformity or atrophy. Skin: Warm and dry, no rash. Neuro:  Strength and sensation are intact. Psych: Normal affect.  Assessment & Plan    Permanent atrial fibrillation -Stable and rate controlled -Continue current medications which include metoprolol 150 mg twice a day, Xarelto 15 mg daily, Demadex 40 mg daily  Chronic diastolic heart failure -Euvolemic on exam -Continue current medication regimen -Consider updating an echo when we see her next in the clinic  Hypertensive -Well-controlled today 122/80 -Continue current medications -Monitor blood pressure at home -Continue low-sodium diet  Hyperlipidemia -Last LDL  112 -She is due for updated lab work including lipid  panel, she will see her PCP in a few weeks and get this done  Vertigo -This is an old diagnosis -Plan for her to go back to see Anderson Malta a physical therapist and treated this in the past -We have provided her with meclizine today to use as needed until she can get an appointment -This is not heart related but inner ear related          Disposition: Follow up 6 months with Larae Grooms, MD or APP.  Signed, Elgie Collard, PA-C 06/18/2022, 5:34 PM [facility]

## 2022-06-18 NOTE — Patient Instructions (Signed)
Medication Instructions:  Start meclizine 12.5 mg twice a day as needed for dizziness *If you need a refill on your cardiac medications before your next appointment, please call your pharmacy*  Lab Work: BMET, lipids, lft's, cbc and A1c with primary care provider in June If you have labs (blood work) drawn today and your tests are completely normal, you will receive your results only by: [place] (if you have MyChart) OR A paper copy in the mail If you have any lab test that is abnormal or we need to change your treatment, we will call you to review the results.  Follow-Up: At [facility], you and your health needs are our priority.  As part of our continuing mission to provide you with exceptional heart care, we have created designated Provider Care Teams.  These Care Teams include your primary Cardiologist (physician) and Advanced Practice Providers (APPs -  Physician Assistants and Nurse Practitioners) who all work together to provide you with the care you need, when you need it.  Your next appointment:   6 month(s)  Provider:   Larae Grooms, MD

## 2022-06-22 ENCOUNTER — Encounter (HOSPITAL_BASED_OUTPATIENT_CLINIC_OR_DEPARTMENT_OTHER): Payer: Self-pay

## 2022-06-22 ENCOUNTER — Ambulatory Visit (HOSPITAL_BASED_OUTPATIENT_CLINIC_OR_DEPARTMENT_OTHER)
Admission: RE | Admit: 2022-06-22 | Discharge: 2022-06-22 | Disposition: A | Discharge status: Home / Self Care | Payer: Medicare HMO | Source: Ambulatory Visit | Attending: Family Medicine | Admitting: Family Medicine

## 2022-06-22 DIAGNOSIS — Z1231 Encounter for screening mammogram for malignant neoplasm of breast: Secondary | ICD-10-CM

## 2022-06-23 ENCOUNTER — Telehealth: Payer: Self-pay | Admitting: Family Medicine

## 2022-06-23 NOTE — Telephone Encounter (Signed)
Copied from [place] [id number]. Topic: Medicare AWV >> Jun 23, 2022  9:31 AM Devoria Glassing wrote: Reason for CRM: Called patient to schedule Medicare Annual Wellness Visit (AWV). Left message for patient to call back and schedule Medicare Annual Wellness Visit (AWV).  Last date of [id number]   Please schedule an appointment at any time with Beatris Ship, CMA  .  If any questions, please contact me.  Thank you ,  Sherol Dade; [place] Direct Dial: [phone]

## 2022-06-25 DIAGNOSIS — E119 Type 2 diabetes mellitus without complications: Secondary | ICD-10-CM | POA: Diagnosis not present

## 2022-06-25 DIAGNOSIS — H35033 Hypertensive retinopathy, bilateral: Secondary | ICD-10-CM | POA: Diagnosis not present

## 2022-06-25 LAB — HM DIABETES EYE EXAM

## 2022-06-29 ENCOUNTER — Ambulatory Visit (INDEPENDENT_AMBULATORY_CARE_PROVIDER_SITE_OTHER): Payer: Medicare HMO | Admitting: *Deleted

## 2022-06-29 VITALS — Ht 60.0 in | Wt 200.0 lb

## 2022-06-29 DIAGNOSIS — Z0000 Encounter for general adult medical examination without abnormal findings: Secondary | ICD-10-CM

## 2022-06-29 NOTE — Patient Instructions (Signed)
Holly Smith , Thank you for taking time to come for your Medicare Wellness Visit. I appreciate your ongoing commitment to your health goals. Please review the following plan we discussed and let me know if I can assist you in the future.     This is a list of the screening recommended for you and due dates:  Health Maintenance  Topic Date Due   Eye exam for diabetics  Never done   Hepatitis C Screening: USPSTF Recommendation to screen - Ages 75-79 yo.  Never done   DTaP/Tdap/Td vaccine (1 - Tdap) Never done   Colon Cancer Screening  06/09/2020   Yearly kidney function blood test for diabetes  03/26/2021   Yearly kidney health urinalysis for diabetes  03/18/2023*   Hemoglobin A1C  09/16/2022   Flu Shot  10/21/2022   Screening for Lung Cancer  03/18/2023   Complete foot exam   03/18/2023   Mammogram  06/22/2023   Medicare Annual Wellness Visit  06/29/2023   Pneumonia Vaccine  Completed   DEXA scan (bone density measurement)  Completed   HPV Vaccine  Aged Out   COVID-19 Vaccine  Discontinued   Zoster (Shingles) Vaccine  Discontinued  *Topic was postponed. The date shown is not the original due date.    Next appointment: Follow up in one year for your annual wellness visit.   Preventive Care 5 Years and Older, Female Preventive care refers to lifestyle choices and visits with your health care provider that can promote health and wellness. What does preventive care include? A yearly physical exam. This is also called an annual well check. Dental exams once or twice a year. Routine eye exams. Ask your health care provider how often you should have your eyes checked. Personal lifestyle choices, including: Daily care of your teeth and gums. Regular physical activity. Eating a healthy diet. Avoiding tobacco and drug use. Limiting alcohol use. Practicing safe sex. Taking low-dose aspirin every day. Taking vitamin and mineral supplements as recommended by your health care  provider. What happens during an annual well check? The services and screenings done by your health care provider during your annual well check will depend on your age, overall health, lifestyle risk factors, and family history of disease. Counseling  Your health care provider may ask you questions about your: Alcohol use. Tobacco use. Drug use. Emotional well-being. Home and relationship well-being. Sexual activity. Eating habits. History of falls. Memory and ability to understand (cognition). Work and work Astronomer. Reproductive health. Screening  You may have the following tests or measurements: Height, weight, and BMI. Blood pressure. Lipid and cholesterol levels. These may be checked every 5 years, or more frequently if you are over 45 years old. Skin check. Lung cancer screening. You may have this screening every year starting at age 66 if you have a 30-pack-year history of smoking and currently smoke or have quit within the past 15 years. Fecal occult blood test (FOBT) of the stool. You may have this test every year starting at age 36. Flexible sigmoidoscopy or colonoscopy. You may have a sigmoidoscopy every 5 years or a colonoscopy every 10 years starting at age 67. Hepatitis C blood test. Hepatitis B blood test. Sexually transmitted disease (STD) testing. Diabetes screening. This is done by checking your blood sugar (glucose) after you have not eaten for a while (fasting). You may have this done every 1-3 years. Bone density scan. This is done to screen for osteoporosis. You may have this done starting at age  65. Mammogram. This may be done every 1-2 years. Talk to your health care provider about how often you should have regular mammograms. Talk with your health care provider about your test results, treatment options, and if necessary, the need for more tests. Vaccines  Your health care provider may recommend certain vaccines, such as: Influenza vaccine. This is  recommended every year. Tetanus, diphtheria, and acellular pertussis (Tdap, Td) vaccine. You may need a Td booster every 10 years. Zoster vaccine. You may need this after age 81. Pneumococcal 13-valent conjugate (PCV13) vaccine. One dose is recommended after age 71. Pneumococcal polysaccharide (PPSV23) vaccine. One dose is recommended after age 36. Talk to your health care provider about which screenings and vaccines you need and how often you need them. This information is not intended to replace advice given to you by your health care provider. Make sure you discuss any questions you have with your health care provider. Document Released: 04/04/2015 Document Revised: 11/26/2015 Document Reviewed: 01/07/2015 Elsevier Interactive Patient Education  2017 [place] Prevention in the Home Falls can cause injuries. They can happen to people of all ages. There are many things you can do to make your home safe and to help prevent falls. What can I do on the outside of my home? Regularly fix the edges of walkways and driveways and fix any cracks. Remove anything that might make you trip as you walk through a door, such as a raised step or threshold. Trim any bushes or trees on the path to your home. Use bright outdoor lighting. Clear any walking paths of anything that might make someone trip, such as rocks or tools. Regularly check to see if handrails are loose or broken. Make sure that both sides of any steps have handrails. Any raised decks and porches should have guardrails on the edges. Have any leaves, snow, or ice cleared regularly. Use sand or salt on walking paths during winter. Clean up any spills in your garage right away. This includes oil or grease spills. What can I do in the bathroom? Use night lights. Install grab bars by the toilet and in the tub and shower. Do not use towel bars as grab bars. Use non-skid mats or decals in the tub or shower. If you need to sit down in  the shower, use a plastic, non-slip stool. Keep the floor dry. Clean up any water that spills on the floor as soon as it happens. Remove soap buildup in the tub or shower regularly. Attach bath mats securely with double-sided non-slip rug tape. Do not have throw rugs and other things on the floor that can make you trip. What can I do in the bedroom? Use night lights. Make sure that you have a light by your bed that is easy to reach. Do not use any sheets or blankets that are too big for your bed. They should not hang down onto the floor. Have a firm chair that has side arms. You can use this for support while you get dressed. Do not have throw rugs and other things on the floor that can make you trip. What can I do in the kitchen? Clean up any spills right away. Avoid walking on wet floors. Keep items that you use a lot in easy-to-reach places. If you need to reach something above you, use a strong step stool that has a grab bar. Keep electrical cords out of the way. Do not use floor polish or wax that makes floors slippery.  If you must use wax, use non-skid floor wax. Do not have throw rugs and other things on the floor that can make you trip. What can I do with my stairs? Do not leave any items on the stairs. Make sure that there are handrails on both sides of the stairs and use them. Fix handrails that are broken or loose. Make sure that handrails are as long as the stairways. Check any carpeting to make sure that it is firmly attached to the stairs. Fix any carpet that is loose or worn. Avoid having throw rugs at the top or bottom of the stairs. If you do have throw rugs, attach them to the floor with carpet tape. Make sure that you have a light switch at the top of the stairs and the bottom of the stairs. If you do not have them, ask someone to add them for you. What else can I do to help prevent falls? Wear shoes that: Do not have high heels. Have rubber bottoms. Are comfortable  and fit you well. Are closed at the toe. Do not wear sandals. If you use a stepladder: Make sure that it is fully opened. Do not climb a closed stepladder. Make sure that both sides of the stepladder are locked into place. Ask someone to hold it for you, if possible. Clearly mark and make sure that you can see: Any grab bars or handrails. First and last steps. Where the edge of each step is. Use tools that help you move around (mobility aids) if they are needed. These include: Canes. Walkers. Scooters. Crutches. Turn on the lights when you go into a dark area. Replace any light bulbs as soon as they burn out. Set up your furniture so you have a clear path. Avoid moving your furniture around. If any of your floors are uneven, fix them. If there are any pets around you, be aware of where they are. Review your medicines with your doctor. Some medicines can make you feel dizzy. This can increase your chance of falling. Ask your doctor what other things that you can do to help prevent falls. This information is not intended to replace advice given to you by your health care provider. Make sure you discuss any questions you have with your health care provider. Document Released: 01/02/2009 Document Revised: 08/14/2015 Document Reviewed: 04/12/2014 Elsevier Interactive Patient Education  2017 [employer].

## 2022-06-29 NOTE — Progress Notes (Signed)
Subjective:  Pt completed ADLs, Fall risk, and SDOH during e-check in on 06/28/22. Answers verified with pt.    Holly Smith is a 77 y.o. female who presents for Medicare Annual (Subsequent) preventive examination.  I connected with  Holly Smith on 06/29/22 by a audio enabled telemedicine application and verified that I am speaking with the correct person using two identifiers.  Patient Location: Home  Provider Location: Office/Clinic  I discussed the limitations of evaluation and management by telemedicine. The patient expressed understanding and agreed to proceed.   Review of Systems     Cardiac Risk Factors include: advanced age (>72men, >67 women);obesity (BMI >30kg/m2);diabetes mellitus;dyslipidemia;hypertension     Objective:    Today's Vitals   06/29/22 0942  Weight: 200 lb (90.7 kg)  Height: 5' (1.524 m)   Body mass index is 39.06 kg/m.     06/29/2022    9:46 AM 01/18/2019    7:54 AM 12/19/2018    4:13 PM 05/31/2018    1:40 PM 11/30/2017    3:23 PM 08/24/2017   11:16 AM 04/13/2017    1:01 PM  Advanced Directives  Does Patient Have a Medical Advance Directive? No No No No No No No  Would patient like information on creating a medical advance directive? No - Patient declined No - Patient declined  No - Patient declined No - Patient declined No - Patient declined Yes (MAU/Ambulatory/Procedural Areas - Information given)    Current Medications (verified) Outpatient Encounter Medications as of 06/29/2022  Medication Sig   BIOTIN PO Take 1 tablet by mouth daily.    Cholecalciferol (VITAMIN D3) 1000 units CAPS Take by mouth.   diclofenac Sodium (VOLTAREN) 1 % GEL Apply topically.   FARXIGA 10 MG TABS tablet Take 10 mg by mouth daily.   fluticasone (FLONASE) 50 MCG/ACT nasal spray Place 2 sprays into both nostrils daily.   irbesartan (AVAPRO) 75 MG tablet TAKE 1 TABLET BY MOUTH EVERY DAY   metoprolol tartrate (LOPRESSOR) 100 MG tablet TAKE 1 & 1/2 TABS  (150MG ) BY MOUTH 2 (TWO) TIMES DAILY.   nitroGLYCERIN (NITROSTAT) 0.4 MG SL tablet Place 1 tablet (0.4 mg total) under the tongue every 5 (five) minutes as needed.   ONETOUCH VERIO test strip 1 each 3 (three) times daily.   pantoprazole (PROTONIX) 40 MG tablet Take 1 tablet (40 mg total) by mouth daily.   Rivaroxaban (XARELTO) 15 MG TABS tablet Take 1 tablet (15 mg total) by mouth daily with supper.   torsemide (DEMADEX) 20 MG tablet Take 2 tablets (40 mg) PO daily   TOVIAZ 4 MG TB24 tablet Take 4 mg by mouth daily.   traZODone (DESYREL) 50 MG tablet Take 1 tablet (50 mg total) by mouth at bedtime as needed for sleep.   TRULICITY 1.5 MG/0.5ML SOPN Inject into the skin.   vitamin B-12 (CYANOCOBALAMIN) 1000 MCG tablet Take 1,000 mcg by mouth every other day.    [DISCONTINUED] potassium chloride SA (K-DUR,KLOR-CON) 20 MEQ tablet Take 20 mEq by mouth daily.   Facility-Administered Encounter Medications as of 06/29/2022  Medication   albuterol (PROVENTIL) (2.5 MG/3ML) 0.083% nebulizer solution 2.5 mg   meclizine (ANTIVERT) tablet 12.5 mg    Allergies (verified) Ciprofloxacin   History: Past Medical History:  Diagnosis Date   A-fib    Abnormal Pap smear of vagina and vaginal HPV    Acute diastolic CHF (congestive heart failure) 01/21/2015   Acute diverticulitis 01/28/2015   Acute on chronic diastolic heart failure  01/21/2015   Anticoagulation adequate 08/02/2014   Overview:  Xarelto 20hs; CHADS=1+ htn, female, age  Overview:  Xarelto 20hs; CHADS=1+ htn, female, age   Arthritis    "bones ache" (01/23/2015)   Atrial fibrillation, persistent 08/13/2015   Overview:  symptomatic (fatigue, DOE, decreased exercise tolerance, palpitations), drug refractory (propafenone, amiodarone), complicated recovery from DCCV possibly related to repiratory obstruction [facility] (patient fear of DCCV)  Overview:  symptomatic (fatigue, DOE, decreased exercise tolerance, palpitations), drug refractory (propafenone,  amiodarone), complicated recovery from DCCV possibly relat   Benign neoplasm of ascending colon    Benign neoplasm of descending colon    Benign neoplasm of sigmoid colon    Blood in stool 01/21/2015   Breath shortness 08/20/2013   CAFL (chronic airflow limitation) 12/16/2014   Cancer    vulvar cancer   CCF (congestive cardiac failure) 08/03/2013   Overview:  ACUTE SYSTOLIC    Chest pain 08/20/2013   CHF (congestive heart failure)    CHF (congestive heart failure)    Chronic anticoagulation    She is on Xarelto for Afib   Chronic atrial fibrillation    Chronic combined systolic and diastolic CHF, NYHA class 3 01/28/2015   Chronic diastolic heart failure 05/05/2015   Condyloma acuminatum of vulva 08/02/2014   COPD (chronic obstructive pulmonary disease) 08/13/2015   Diverticulitis large intestine w/o perforation or abscess w/bleeding 01/28/2015   Diverticulitis of large intestine without perforation or abscess with bleeding    Dysmetabolic syndrome 12/16/2014   Overview:  BG-166 06/15/2013; elevated BMI; central obesity    Dyspnea    Echocardiogram abnormal    Mitral regurgitation, tricuspic regurgitation   Essential hypertension 10/07/2014   Trial off acei 10/07/2014  Due to atypical sob (talking = walking)> no real change 11/18/2014     Family history of adverse reaction to anesthesia    sister gets very nauseated and vomits   Gastritis without bleeding    GERD (gastroesophageal reflux disease)    History of kidney stones    History of stress test    Myoview 8/16: EF 42%, anteroseptal, inferoseptal, anterior, apical anterior and apical defect (likely represents breast attenuation versus scar), no ischemia; intermediate risk because of low EF   HPV (human papilloma virus) anogenital infection    03/21/12 PAP + HR HPV   HTN (hypertension) 12/16/2014   Hypertension    Hypertensive heart disease 05/12/2016   Hypertensive urgency 01/21/2015   Insulin resistance    Kidney stones     Malignant neoplasm of vulva 06/19/2014   Overview:  Ms Holly FreshwaterReva H Smith is a 77 yo who was referred by Dr Feliberto GottronSchermerhorn for a vulvar lesion. In 04/2014 Dr Feliberto GottronSchermerhorn did a wide local excision, which demonstrated grade 1 verrucous carcinoma with 2.4 mm invasion. The specimen had negative margins, but was within 2 mm of the cancer.     Mitral regurgitation 04/23/2013   Overview:  "Moderate to severe" by TTE, "mild to mod" by TEE  Overview:  "Moderate to severe" by TTE, "mild to mod" by TEE   Obesity    OSA (obstructive sleep apnea) 11/27/2014   07/2013 RDI 17/h CPAP 8    Other specified diseases of esophagus    Postoperative nausea and vomiting 1983   many years ago with hysterectomy   PVC (premature ventricular contraction) 12/16/2014   TI (tricuspid incompetence) 04/23/2013   Overview:  "moderate" by TTE    Urolithiasis    Vertigo    Vulvar cancer 2016   invasive verrucous  carcinoma of the vulva. History of genital warts and vulvar condyloma   Vulvar cancer, carcinoma 05/10/2014   Past Surgical History:  Procedure Laterality Date   ABDOMINAL HYSTERECTOMY     APPENDECTOMY     BILATERAL SALPINGOOPHORECTOMY  2003   benign ovarian cancer   CARDIOVERSION  2014-2015   "[facility]"   COLONOSCOPY WITH PROPOFOL N/A 06/10/2015   Procedure: COLONOSCOPY WITH PROPOFOL;  Surgeon: Midge Minium, MD;  Location: [facility] ENDOSCOPY;  Service: Endoscopy;  Laterality: N/A;   CORONARY STENT INTERVENTION N/A 01/18/2019   Procedure: CORONARY STENT INTERVENTION;  Surgeon: Corky Crafts, MD;  Location: [facility] INVASIVE CV LAB;  Service: Cardiovascular;  Laterality: N/A;   CYSTOSCOPY W/ RETROGRADES Bilateral 10/19/2016   Procedure: CYSTOSCOPY WITH RETROGRADE PYELOGRAM;  Surgeon: Vanna Scotland, MD;  Location: [facility] ORS;  Service: Urology;  Laterality: Bilateral;   CYSTOSCOPY W/ URETERAL STENT PLACEMENT Left 12/29/2016   Procedure: CYSTOSCOPY WITH STENT REPLACEMENT;  Surgeon: Vanna Scotland, MD;  Location:  [facility] ORS;  Service: Urology;  Laterality: Left;   CYSTOSCOPY WITH BIOPSY N/A 10/19/2016   Procedure: CYSTOSCOPY WITH BLADDER BIOPSY, VAGINAL WALL BIOPSY;  Surgeon: Vanna Scotland, MD;  Location: [facility] ORS;  Service: Urology;  Laterality: N/A;   CYSTOSCOPY WITH STENT PLACEMENT Left 12/14/2016   Procedure: CYSTOSCOPY WITH STENT PLACEMENT;  Surgeon: Vanna Scotland, MD;  Location: [facility] ORS;  Service: Urology;  Laterality: Left;   ESOPHAGOGASTRODUODENOSCOPY (EGD) WITH PROPOFOL N/A 10/05/2016   Surgeon: Midge Minium, MD;  Location: [facility] ENDOSCOPY;  Results: Barrett's Esophagus- repeat in 3 years 09/2019   LEFT HEART CATH AND CORONARY ANGIOGRAPHY N/A 01/18/2019   Procedure: LEFT HEART CATH AND CORONARY ANGIOGRAPHY;  Surgeon: Corky Crafts, MD;  Location: [facility] INVASIVE CV LAB;  Service: Cardiovascular;  Laterality: N/A;   LITHOTRIPSY     STONE EXTRACTION WITH BASKET Left 12/29/2016   Procedure: STONE EXTRACTION WITH BASKET;  Surgeon: Vanna Scotland, MD;  Location: [facility] ORS;  Service: Urology;  Laterality: Left;   TONSILLECTOMY     URETEROSCOPY Left 12/29/2016   Procedure: URETEROSCOPY;  Surgeon: Vanna Scotland, MD;  Location: [facility] ORS;  Service: Urology;  Laterality: Left;   VULVECTOMY Right 05/10/2014   Excisional biopsy of the superior right labial majora mass; [facility]   VULVECTOMY PARTIAL  07/02/2014   Re-excision and sentinel node dissection at [facility].    Family History  Problem Relation Age of Onset   Prostate cancer Brother 67       still living and well   Heart attack Father    Stroke Mother    Diabetes Mother    Hypertension Mother    Heart attack Brother    Pancreatic cancer Brother    Heart disease Sister    Breast cancer Neg Hx    Kidney cancer Neg Hx    Bladder Cancer Neg Hx    Social History   Socioeconomic History   Marital status: Divorced    Spouse name: Not on file   Number of children: 1   Years of education: GED   Highest education level: 12th grade   Occupational History   Occupation: Retired  Tobacco Use   Smoking status: Former    Packs/day: 1.00    Years: 50.00    Additional pack years: 0.00    Total pack years: 50.00    Types: Cigarettes    Quit date: 05/20/2012    Years since quitting: 10.1   Smokeless tobacco: Never   Tobacco comments:    smoking cessation materials  not required  Vaping Use   Vaping Use: Never used  Substance and Sexual Activity   Alcohol use: No    Alcohol/week: 0.0 standard drinks of alcohol   Drug use: No   Sexual activity: Not Currently  Other Topics Concern   Not on file  Social History Narrative   Not on file   Social Determinants of Health   Financial Resource Strain: Low Risk  (06/28/2022)   Overall Financial Resource Strain (CARDIA)    Difficulty of Paying Living Expenses: Not hard at all  Food Insecurity: No Food Insecurity (06/28/2022)   Hunger Vital Sign    Worried About Running Out of Food in the Last Year: Never true    Ran Out of Food in the Last Year: Never true  Transportation Needs: No Transportation Needs (06/28/2022)   PRAPARE - Administrator, Civil Service (Medical): No    Lack of Transportation (Non-Medical): No  Physical Activity: Insufficiently Active (06/28/2022)   Exercise Vital Sign    Days of Exercise per Week: 2 days    Minutes of Exercise per Session: 20 min  Stress: No Stress Concern Present (06/28/2022)   [employer] of Occupational Health - Occupational Stress Questionnaire    Feeling of Stress : Not at all  Social Connections: Unknown (06/28/2022)   Social Connection and Isolation Panel [NHANES]    Frequency of Communication with Friends and Family: More than three times a week    Frequency of Social Gatherings with Friends and Family: More than three times a week    Attends Religious Services: Not on file    Active Member of Clubs or Organizations: No    Attends Banker Meetings: Never    Marital Status: Widowed    Tobacco  Counseling Counseling given: Not Answered Tobacco comments: smoking cessation materials not required   Clinical Intake:  Pre-visit preparation completed: Yes  Pain : No/denies pain  BMI - recorded: 39.06 Nutritional Status: BMI > 30  Obese Nutritional Risks: None Diabetes: Yes CBG done?: No Did pt. bring in CBG monitor from home?: No  How often do you need to have someone help you when you read instructions, pamphlets, or other written materials from your doctor or pharmacy?: 1 - Never   Activities of Daily Living    06/28/2022    7:24 PM  In your present state of health, do you have any difficulty performing the following activities:  Hearing? 0  Vision? 0  Difficulty concentrating or making decisions? 0  Walking or climbing stairs? 0  Dressing or bathing? 0  Doing errands, shopping? 0  Preparing Food and eating ? N  Using the Toilet? N  In the past six months, have you accidently leaked urine? Y  Do you have problems with loss of bowel control? Y  Managing your Medications? N  Managing your Finances? N  Housekeeping or managing your Housekeeping? N    Patient Care Team: [name], DO as PCP - General (Family Medicine) Corky Crafts, MD as PCP - Cardiology (Cardiology) Corky Crafts, MD as Consulting Physician (Cardiology) Hillis Range, MD (Inactive) as Consulting Physician (Cardiology) Vanna Scotland, MD as Consulting Physician (Urology) Midge Minium, MD as Consulting Physician (Gastroenterology) Leida Lauth, MD as Consulting Physician (Obstetrics and Gynecology) Nyoka Cowden, MD as Consulting Physician (Pulmonary Disease) Mertie Moores, MD as Consulting Physician (Specialist) Benita Gutter, RN as Oncology Nurse Navigator  Indicate any recent Medical Services you may have received from  other than [facility] providers in the past year (date may be approximate).     Assessment:   This is a routine wellness examination for  Holly Smith.  Hearing/Vision screen No results found.  Dietary issues and exercise activities discussed: Current Exercise Habits: The patient does not participate in regular exercise at present, Exercise limited by: None identified   Goals Addressed   None    Depression Screen    06/29/2022    9:48 AM 04/27/2022    3:49 PM 04/13/2017    1:02 PM 03/18/2016    9:38 AM  PHQ 2/9 Scores  PHQ - 2 Score 0 0 0 3  PHQ- 9 Score  2  12    Fall Risk    06/28/2022    7:24 PM 04/27/2022    3:49 PM 04/13/2017    1:02 PM 03/18/2016    9:38 AM  Fall Risk   Falls in the past year? 0 0 No No  Number falls in past yr: 0 0    Injury with Fall? 0 0    Risk for fall due to : No Fall Risks No Fall Risks    Follow up Falls evaluation completed Falls evaluation completed      FALL RISK PREVENTION PERTAINING TO THE HOME:  Any stairs in or around the home? Yes  If so, are there any without handrails? No  Home free of loose throw rugs in walkways, pet beds, electrical cords, etc? Yes  Adequate lighting in your home to reduce risk of falls? Yes   ASSISTIVE DEVICES UTILIZED TO PREVENT FALLS:  Life alert? No  Use of a cane, walker or w/c? No  Grab bars in the bathroom? Yes  Shower chair or bench in shower? Yes  Elevated toilet seat or a handicapped toilet? No   TIMED UP AND GO:  Was the test performed?  No, audio visit .   Cognitive Function:        06/29/2022    9:56 AM 04/13/2017    2:26 PM  6CIT Screen  What Year? 0 points 0 points  What month? 0 points 0 points  What time? 0 points 0 points  Count back from 20 2 points 0 points  Months in reverse 0 points 0 points  Repeat phrase 0 points 0 points  Total Score 2 points 0 points    Immunizations Immunization History  Administered Date(s) Administered   Influenza, High Dose Seasonal PF 02/15/2017   Influenza,inj,Quad PF,6+ Mos 01/24/2015   Influenza-Unspecified 03/27/2018   Pneumococcal Conjugate-13 04/13/2017   Pneumococcal  Polysaccharide-23 01/24/2015    TDAP status: Due, Education has been provided regarding the importance of this vaccine. Advised may receive this vaccine at local pharmacy or Health Dept. Aware to provide a copy of the vaccination record if obtained from local pharmacy or Health Dept. Verbalized acceptance and understanding.  Flu Vaccine status: Up to date  Pneumococcal vaccine status: Up to date  Covid-19 vaccine status: Information provided on how to obtain vaccines.   Qualifies for Shingles Vaccine? Yes   Zostavax completed No   Shingrix Completed?: No.    Education has been provided regarding the importance of this vaccine. Patient has been advised to call insurance company to determine out of pocket expense if they have not yet received this vaccine. Advised may also receive vaccine at local pharmacy or Health Dept. Verbalized acceptance and understanding.  Screening Tests Health Maintenance  Topic Date Due   Medicare Annual Wellness (AWV)  Never done  OPHTHALMOLOGY EXAM  Never done   Hepatitis C Screening  Never done   DTaP/Tdap/Td (1 - Tdap) Never done   COLONOSCOPY (Pts 45-59yrs Insurance coverage will need to be confirmed)  06/09/2020   Diabetic kidney evaluation - eGFR measurement  03/26/2021   Diabetic kidney evaluation - Urine ACR  03/18/2023 (Originally 11/28/1963)   HEMOGLOBIN A1C  09/16/2022   INFLUENZA VACCINE  10/21/2022   Lung Cancer Screening  03/18/2023   FOOT EXAM  03/18/2023   MAMMOGRAM  06/22/2023   Pneumonia Vaccine 24+ Years old  Completed   DEXA SCAN  Completed   HPV VACCINES  Aged Out   COVID-19 Vaccine  Discontinued   Zoster Vaccines- Shingrix  Discontinued    Health Maintenance  Health Maintenance Due  Topic Date Due   Medicare Annual Wellness (AWV)  Never done   OPHTHALMOLOGY EXAM  Never done   Hepatitis C Screening  Never done   DTaP/Tdap/Td (1 - Tdap) Never done   COLONOSCOPY (Pts 45-29yrs Insurance coverage will need to be confirmed)   06/09/2020   Diabetic kidney evaluation - eGFR measurement  03/26/2021    Colorectal Cancer screen: Pt is overdue and agrees to call GI to make appointment.  Mammogram status: Completed 06/22/22. Repeat every year  Bone Density status: Pt declined.  Lung Cancer Screening: (Low Dose CT Chest recommended if Age 93-80 years, 30 pack-year currently smoking OR have quit w/in 15years.) does qualify.   Lung Cancer Screening Referral: already in system.  Last ct scan was 03/17/22.  Additional Screening:  Hepatitis C Screening: does qualify; Completed N/a  Vision Screening: Recommended annual ophthalmology exams for early detection of glaucoma and other disorders of the eye. Is the patient up to date with their annual eye exam?  Yes  Who is the provider or what is the name of the office in which the patient attends annual eye exams? Can't remember name at this time If pt is not established with a provider, would they like to be referred to a provider to establish care? No .   Dental Screening: Recommended annual dental exams for proper oral hygiene  Community Resource Referral / Chronic Care Management: CRR required this visit?  No   CCM required this visit?  No      Plan:     I have personally reviewed and noted the following in the patient's chart:   Medical and social history Use of alcohol, tobacco or illicit drugs  Current medications and supplements including opioid prescriptions. Patient is not currently taking opioid prescriptions. Functional ability and status Nutritional status Physical activity Advanced directives List of other physicians Hospitalizations, surgeries, and ER visits in previous 12 months Vitals Screenings to include cognitive, depression, and falls Referrals and appointments  In addition, I have reviewed and discussed with patient certain preventive protocols, quality metrics, and best practice recommendations. A written personalized care plan for  preventive services as well as general preventive health recommendations were provided to patient.   Due to this being a telephonic visit, the after visit summary with patients personalized plan was offered to patient via mail or my-chart. Patient would like to access on my-chart.  Holly Smith, [place]   06/29/2022   Nurse Notes: None

## 2022-07-06 ENCOUNTER — Ambulatory Visit (INDEPENDENT_AMBULATORY_CARE_PROVIDER_SITE_OTHER): Payer: Medicare HMO | Admitting: Neurology

## 2022-07-06 DIAGNOSIS — G4733 Obstructive sleep apnea (adult) (pediatric): Secondary | ICD-10-CM | POA: Diagnosis not present

## 2022-07-06 DIAGNOSIS — I5033 Acute on chronic diastolic (congestive) heart failure: Secondary | ICD-10-CM

## 2022-07-06 DIAGNOSIS — I2510 Atherosclerotic heart disease of native coronary artery without angina pectoris: Secondary | ICD-10-CM

## 2022-07-06 DIAGNOSIS — I482 Chronic atrial fibrillation, unspecified: Secondary | ICD-10-CM

## 2022-07-06 DIAGNOSIS — J449 Chronic obstructive pulmonary disease, unspecified: Secondary | ICD-10-CM

## 2022-07-06 DIAGNOSIS — G4736 Sleep related hypoventilation in conditions classified elsewhere: Secondary | ICD-10-CM

## 2022-07-07 ENCOUNTER — Telehealth: Payer: Self-pay | Admitting: Family Medicine

## 2022-07-07 ENCOUNTER — Encounter: Payer: Self-pay | Admitting: Family Medicine

## 2022-07-07 MED ORDER — PIOGLITAZONE HCL 30 MG PO TABS: 30.0000 mg | ORAL_TABLET | Freq: Every day | ORAL | 3 refills | Status: DC

## 2022-07-07 NOTE — Telephone Encounter (Signed)
Alt sent. Ty.  °

## 2022-07-07 NOTE — Telephone Encounter (Signed)
Patient informed. 

## 2022-07-07 NOTE — Telephone Encounter (Signed)
Having problems with Trulicity. Here lately has been terrible for her going to the bathroom all the time. She did not do the injection on Sunday and is much better. She would like something in its place to take. She cannot take Metformin

## 2022-07-08 NOTE — Progress Notes (Signed)
[facility] SLEEP TEST REPORT ( by Watch PAT)   STUDY DATA:  07-09-2022 DOB: 12/20/1945  ZOX:[id number]     ORDERING CLINICIAN:  REFERRING CLINICIAN:    CLINICAL INFORMATION/HISTORY: Holly Smith is a 77 y.o. female OSA and A fib patient who is seen here as a NEW PATIENT in consultation upon referral on 05/25/2022 . Referred by Dr Carmelia Roller for a new sleep evaluation.  Chief concern according to patient :  "I need a new CPAP "   I have the pleasure of seeing Holly Smith on 05/25/22 . She is a right-handed female with known OSA sleep disorder. She has  been morbidly obese and has CAD, atrial fibrillation. She had a sleep study in 2015 at [facility] , and had a CPAP titration there , too. She dislikes FFM. She developed atrial fib and failed 2 attempts at cardioversion, as she did not reach sustained sinus rhythm.  Finally she saw Dr Clarene Duke, Cardiology. He referred for ablation, and she saw Dr Johney Frame who stated it won't work for her.  She continues to use CPAP, she is using a REM Star pro - at least 79 years old and on recall.  Nuance pro - nasal pillows.  She needs a new machine !  Epworth sleepiness score: 11/24. FSS at 30/ 63 points, currently still using her CPAP.   BMI: 40.3 kg/m   Neck Circumference: 18"   FINDINGS:   Sleep Summary:   Total Recording Time (hours, min):    7 hours 12 minutes   Total Sleep Time (hours, min): 6 hours 39 minutes               Percent REM (%): 19.7%     Sleep latency was 13 minutes and REM sleep latency was 132 minutes in duration.   There were only 20 minutes of wakefulness recorded after sleep onset.                                Respiratory Indices:   Calculated pAHI (per hour):    28.2/h by AASM criteria, and 15.3/h by CMS criteria.    No central events were recorded.                      REM pAHI:     47.1/h                                            NREM pAHI:    23.7/h                           Positional AHI: The patient slept exclusively in supine position with an AHI of 28.1, RDI of 29.2.                                                  Oxygen Saturation Statistics:  O2 Saturation Range (%): Between a nadir at 78 % and a maximum of 96% with a mean oxygen saturation of 91%  O2 Saturation (minutes) <89%:   30.5 minutes, about 8% of total sleep time. O2 saturation (minutes) <90% : 43 minutes, about 11% of total sleep time        Pulse Rate Statistics:   Pulse Mean (bpm):   Mean heart rate was 76 bpm              Pulse Range:   Varied between 52 bpm and a maximum of 101 bpm.              IMPRESSION:  This HST confirms the presence of moderate-severe obstructive sleep apnea, with clinically significant hypoxia.  The apnea remains in the same moderate- severe range when apnea-hypopnea events were scored by AASM criteria of 3% desaturation or by CMS criteria, by 4% desaturation.   RECOMMENDATION: This patient indeed will need to continue with positive airway pressure therapy. I will order an auto-titration device, this time a ResMed model S11 with heated humidification and a pressure setting between 6 and 16 cm water pressure, 3 cm EPR. The patient is an experienced CPAP user who can choose her interface.    INTERPRETING PHYSICIAN:  Melvyn Novas, MD  Board certified in Neurology and Sleep Medicine by the ABPN and ABSM [facility] is an AASM Production assistant, radio of Sleep Medicine ) accredited facility.

## 2022-07-09 NOTE — Procedures (Signed)
[facility] SLEEP TEST REPORT ( by Watch PAT)   STUDY DATA:  07-09-2022 DOB: 1945-04-23  ZOX:[id number]     ORDERING CLINICIAN:  REFERRING CLINICIAN:    CLINICAL INFORMATION/HISTORY: Holly Smith is a 77 y.o. female OSA and A fib patient who is seen here as a NEW PATIENT in consultation upon referral on 05/25/2022 . Referred by Dr Carmelia Roller for a new sleep evaluation.  Chief concern according to patient :  "I need a new CPAP "   I have the pleasure of seeing Holly Smith on 05/25/22 . She is a right-handed female with known OSA sleep disorder. She has  been morbidly obese and has CAD, atrial fibrillation. She had a sleep study in 2015 at [facility] , and had a CPAP titration there , too. She dislikes FFM. She developed atrial fib and failed 2 attempts at cardioversion, as she did not reach sustained sinus rhythm.  Finally she saw Dr Clarene Duke, Cardiology. He referred for ablation, and she saw Dr Johney Frame who stated it won't work for her.  She continues to use CPAP, she is using a REM Star pro - at least 79 years old and on recall.  Nuance pro - nasal pillows.  She needs a new machine !  Epworth sleepiness score: 11/24. FSS at 30/ 63 points, currently still using her CPAP.   BMI: 40.3 kg/m   Neck Circumference: 18"   FINDINGS:   Sleep Summary:   Total Recording Time (hours, min):    7 hours 12 minutes   Total Sleep Time (hours, min): 6 hours 39 minutes               Percent REM (%): 19.7%     Sleep latency was 13 minutes and REM sleep latency was 132 minutes in duration.   There were only 20 minutes of wakefulness recorded after sleep onset.                                Respiratory Indices:   Calculated pAHI (per hour):    28.2/h by AASM criteria, and 15.3/h by CMS criteria.    No central events were recorded.                      REM pAHI:     47.1/h                                            NREM pAHI:   23.7/h                            Positional AHI: The patient slept exclusively in supine position with an AHI of 28.1, RDI of 29.2.                                                  Oxygen Saturation Statistics:  O2 Saturation Range (%): Between a nadir at 78 % and a maximum of 96% with a mean oxygen saturation of 91%  O2 Saturation (minutes) <89%:   30.5 minutes, about 8% of total sleep time. O2 saturation (minutes) <90% : 43 minutes, about 11% of total sleep time        Pulse Rate Statistics:   Pulse Mean (bpm):   Mean heart rate was 76 bpm              Pulse Range:   Varied between 52 bpm and a maximum of 101 bpm.              IMPRESSION:  This HST confirms the presence of moderate-severe obstructive sleep apnea, with clinically significant hypoxia.  The apnea remains in the same moderate- severe range when apnea-hypopnea events were scored by AASM criteria of 3% desaturation or by CMS criteria, by 4% desaturation.   RECOMMENDATION: This patient indeed will need to continue with positive airway pressure therapy. I will order an auto-titration device, this time a ResMed model S11 with heated humidification and a pressure setting between 6 and 16 cm water pressure, 3 cm EPR. The patient is an experienced CPAP user who can choose her interface.    INTERPRETING PHYSICIAN:  Melvyn Novas, MD  Board certified in Neurology and Sleep Medicine by the ABPN and ABSM [facility] is an AASM Production assistant, radio of Sleep Medicine ) accredited facility.

## 2022-07-12 ENCOUNTER — Telehealth: Payer: Self-pay

## 2022-07-12 NOTE — Telephone Encounter (Signed)
I called pt. I advised pt that Dr. Vickey Huger reviewed their sleep study results and found that pt moderate to sever OSA. Dr. Vickey Huger recommends that pt continue use of PAP device. I reviewed PAP compliance expectations with the pt. Pt is agreeable to starting a AutoPAP. I advised pt that an order will be sent to a DME, [employer], and [employer] will call the pt within about one week after they file with the pt's insurance. [employer] will show the pt how to use the machine, fit for masks, and troubleshoot the CPAP if needed. A follow up appt was made for insurance purposes with Margie Ege on 7/23 1245. Pt verbalized understanding to arrive 15 minutes early and bring their APAP. Pt verbalized understanding of results. Pt had no questions at this time but was encouraged to call back if questions arise. I have sent the order to [employer] and have received confirmation that they have received the order.

## 2022-07-12 NOTE — Telephone Encounter (Signed)
-----   Message from Melvyn Novas, MD sent at 07/09/2022  3:37 PM EDT ----- This patient indeed will need to continue with positive airway pressure therapy. I have ordered an auto-titration device: this time a ResMed model S11 with heated humidification and a pressure setting between 6 and 16 cm water pressure, 3 cm EPR. The patient is an experienced CPAP user who can choose her interface.

## 2022-07-13 NOTE — Telephone Encounter (Signed)
New, Maryruth Bun, Abbe Amsterdam, CMA; Santina Evans; Chase Picket, [place]; Marveen Reeks Received, Thank you!     ----- Message ----- From: Bobbye Morton, CMA Sent: 07/12/2022  10:29 AM EDT To: Henderson Newcomer; Kathyrn Sheriff; Santina Evans; * Subject: New AutoPAP                                    New orders have been placed for the above pt, DOB: 03/25/1945 Thanks

## 2022-07-14 ENCOUNTER — Telehealth: Payer: Self-pay

## 2022-07-14 ENCOUNTER — Encounter: Payer: Self-pay | Admitting: Family Medicine

## 2022-07-16 ENCOUNTER — Ambulatory Visit (INDEPENDENT_AMBULATORY_CARE_PROVIDER_SITE_OTHER): Payer: Medicare HMO | Admitting: Family Medicine

## 2022-07-16 ENCOUNTER — Encounter: Payer: Self-pay | Admitting: Family Medicine

## 2022-07-16 VITALS — BP 112/68 | HR 85 | Temp 97.6°F | Ht 60.0 in | Wt 202.1 lb

## 2022-07-16 DIAGNOSIS — E1165 Type 2 diabetes mellitus with hyperglycemia: Secondary | ICD-10-CM

## 2022-07-16 DIAGNOSIS — Z7985 Long-term (current) use of injectable non-insulin antidiabetic drugs: Secondary | ICD-10-CM

## 2022-07-16 DIAGNOSIS — R300 Dysuria: Secondary | ICD-10-CM

## 2022-07-16 LAB — POC URINALSYSI DIPSTICK (AUTOMATED)
Bilirubin, UA: NEGATIVE
Blood, UA: NEGATIVE
Glucose, UA: NEGATIVE
Ketones, UA: NEGATIVE
Nitrite, UA: NEGATIVE
Protein, UA: NEGATIVE
Spec Grav, UA: 1.01 (ref 1.010–1.025)
Urobilinogen, UA: 0.2 E.U./dL
pH, UA: 6 (ref 5.0–8.0)

## 2022-07-16 MED ORDER — TRULICITY 0.75 MG/0.5ML ~~LOC~~ SOAJ
0.7500 mg | SUBCUTANEOUS | 0 refills | Status: DC
Start: 2022-07-16 — End: 2022-07-30

## 2022-07-16 MED ORDER — TRULICITY 1.5 MG/0.5ML ~~LOC~~ SOAJ
1.5000 mg | SUBCUTANEOUS | 1 refills | Status: DC
Start: 2022-09-10 — End: 2022-07-30

## 2022-07-16 MED ORDER — CEPHALEXIN 500 MG PO CAPS
500.0000 mg | ORAL_CAPSULE | Freq: Three times a day (TID) | ORAL | 0 refills | Status: DC
Start: 2022-07-16 — End: 2022-07-30

## 2022-07-16 NOTE — Progress Notes (Signed)
Chief Complaint  Patient presents with   Dysuria    Holly Smith is a 77 y.o. female here for possible UTI.  Duration: 3 days. Symptoms: Dysuria, urinary retention, flank pain on right, and urgency Denies: urinary frequency, hematuria, urinary hesitancy, urinary retention, fever, nausea, and vomiting, vaginal discharge Hx of recurrent UTI? No Denies new sexual partners.  DM Taking Actos 30 mg/d. Reports compliance, no AE's. Diet is OK. No routine exercise. No CP or SOB. When she took Trulicity, it gave her freq bowel movements.   Past Medical History:  Diagnosis Date   A-fib (HCC)    Abnormal Pap smear of vagina and vaginal HPV    Acute diastolic CHF (congestive heart failure) (HCC) 01/21/2015   Acute diverticulitis 01/28/2015   Acute on chronic diastolic heart failure (HCC) 01/21/2015   Anticoagulation adequate 08/02/2014   Overview:  Xarelto 20hs; CHADS=1+ htn, female, age  Overview:  Xarelto 20hs; CHADS=1+ htn, female, age   Arthritis    "bones ache" (01/23/2015)   Atrial fibrillation, persistent (HCC) 08/13/2015   Overview:  symptomatic (fatigue, DOE, decreased exercise tolerance, palpitations), drug refractory (propafenone, amiodarone), complicated recovery from DCCV possibly related to repiratory obstruction [facility] (patient fear of DCCV)  Overview:  symptomatic (fatigue, DOE, decreased exercise tolerance, palpitations), drug refractory (propafenone, amiodarone), complicated recovery from DCCV possibly relat   Benign neoplasm of ascending colon    Benign neoplasm of descending colon    Benign neoplasm of sigmoid colon    Blood in stool 01/21/2015   Breath shortness 08/20/2013   CAFL (chronic airflow limitation) (HCC) 12/16/2014   Cancer (HCC)    vulvar cancer   CCF (congestive cardiac failure) (HCC) 08/03/2013   Overview:  ACUTE SYSTOLIC    Chest pain 08/20/2013   CHF (congestive heart failure) (HCC)    CHF (congestive heart failure) (HCC)    Chronic  anticoagulation    She is on Xarelto for Afib   Chronic atrial fibrillation (HCC)    Chronic combined systolic and diastolic CHF, NYHA class 3 (HCC) 01/28/2015   Chronic diastolic heart failure (HCC) 05/05/2015   Condyloma acuminatum of vulva 08/02/2014   COPD (chronic obstructive pulmonary disease) (HCC) 08/13/2015   Diverticulitis large intestine w/o perforation or abscess w/bleeding 01/28/2015   Diverticulitis of large intestine without perforation or abscess with bleeding    Dysmetabolic syndrome 12/16/2014   Overview:  BG-166 06/15/2013; elevated BMI; central obesity    Dyspnea    Echocardiogram abnormal    Mitral regurgitation, tricuspic regurgitation   Essential hypertension 10/07/2014   Trial off acei 10/07/2014  Due to atypical sob (talking = walking)> no real change 11/18/2014     Family history of adverse reaction to anesthesia    sister gets very nauseated and vomits   Gastritis without bleeding    GERD (gastroesophageal reflux disease)    History of kidney stones    History of stress test    Myoview 8/16: EF 42%, anteroseptal, inferoseptal, anterior, apical anterior and apical defect (likely represents breast attenuation versus scar), no ischemia; intermediate risk because of low EF   HPV (human papilloma virus) anogenital infection    03/21/12 PAP + HR HPV   HTN (hypertension) 12/16/2014   Hypertension    Hypertensive heart disease 05/12/2016   Hypertensive urgency 01/21/2015   Insulin resistance    Kidney stones    Malignant neoplasm of vulva (HCC) 06/19/2014   Overview:  Holly Smith is a 77 yo who was referred by Dr Feliberto Gottron  for a vulvar lesion. 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 The specimen had negative margins, but was within 2 mm of the cancer.     Mitral regurgitation 04/23/2013   Overview:  "Moderate to severe" by TTE, "mild to mod" by TEE  Overview:  "Moderate to  severe" by TTE, "mild to mod" by TEE   Obesity    OSA (obstructive sleep apnea) 11/27/2014   07/2013 RDI 17/h CPAP 8    Other specified diseases of esophagus    Postoperative nausea and vomiting 1983   many years ago with hysterectomy   PVC (premature ventricular contraction) 12/16/2014   TI (tricuspid incompetence) 04/23/2013   Overview:  "moderate" by TTE    Urolithiasis    Vertigo    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 History of genital warts and vulvar condyloma   Vulvar cancer, carcinoma (HCC) 05/10/2014     BP 112/68 (BP Location: Left Arm, Patient Position: Sitting, Cuff Size: Large)   Pulse 85   Temp 97.6 F (36.4 C) (Oral)   Ht 5' (1.524 m)   Wt 202 lb 2 oz (91.7 kg)   SpO2 99%   BMI 39.47 kg/m  General: Awake, alert, appears stated age Heart: RRR Lungs: CTAB, normal respiratory effort, no accessory muscle usage Abd: BS+, soft, TTP over suprapubic region, ND, no masses or organomegaly MSK: No CVA tenderness, neg Lloyd's sign Psych: Age appropriate judgment and insight  Dysuria - Plan: POCT Urinalysis Dipstick (Automated), POCT Urinalysis Dipstick (Automated), Urine Culture, cephALEXin (KEFLEX) 500 MG capsule  Type 2 diabetes mellitus with hyperglycemia, without long-term current use of insulin (HCC) - Plan: Dulaglutide (TRULICITY) 0.75 MG/0.5ML SOPN, Dulaglutide (TRULICITY) 1.5 MG/0.5ML SOPN, Microalbumin / creatinine urine ratio, CANCELED: Microalbumin / creatinine urine ratio  Stay hydrated. 7 d of Keflex. Seek immediate care if pt starts to develop fevers, new/worsening symptoms, uncontrollable N/V. Chronic, AE of chronic med. Will have her stop Actos and go back to Trulicity weekly. Will have her take a fiber supp for the first 3 d of her injection.  The patient voiced understanding and agreement to the plan.  [name] [place], DO 07/16/22 2:34 PM

## 2022-07-16 NOTE — Patient Instructions (Addendum)
Stay hydrated.   Warning signs/symptoms: Uncontrollable nausea/vomiting, fevers, worsening symptoms despite treatment, confusion.  Give [place] around 2 business days to get culture back to you.  Take Metamucil or Benefiber daily for 3 days on the first day of your weekly shot.   Keep the diet clean and stay active.  Let us know if you need anything.

## 2022-07-17 LAB — MICROALBUMIN / CREATININE URINE RATIO
Creatinine, Urine: 41 mg/dL (ref 20–275)
Microalb Creat Ratio: 34 mg/g creat — ABNORMAL HIGH (ref <30)
Microalb, Ur: 1.4 mg/dL

## 2022-07-19 ENCOUNTER — Other Ambulatory Visit: Payer: Self-pay | Admitting: Family Medicine

## 2022-07-19 LAB — URINE CULTURE
MICRO NUMBER:: 14879728
SPECIMEN QUALITY:: ADEQUATE

## 2022-07-19 MED ORDER — NITROFURANTOIN MONOHYD MACRO 100 MG PO CAPS: 100.0000 mg | ORAL_CAPSULE | Freq: Two times a day (BID) | ORAL | 0 refills | Status: AC

## 2022-07-23 ENCOUNTER — Ambulatory Visit: Payer: Medicare HMO | Admitting: Physician Assistant

## 2022-07-24 DIAGNOSIS — H524 Presbyopia: Secondary | ICD-10-CM | POA: Diagnosis not present

## 2022-07-24 DIAGNOSIS — H52223 Regular astigmatism, bilateral: Secondary | ICD-10-CM | POA: Diagnosis not present

## 2022-07-26 DIAGNOSIS — G4733 Obstructive sleep apnea (adult) (pediatric): Secondary | ICD-10-CM | POA: Diagnosis not present

## 2022-07-27 ENCOUNTER — Emergency Department (HOSPITAL_COMMUNITY): Payer: Medicare HMO

## 2022-07-27 ENCOUNTER — Encounter (HOSPITAL_COMMUNITY): Payer: Self-pay

## 2022-07-27 ENCOUNTER — Inpatient Hospital Stay (HOSPITAL_COMMUNITY)
Admission: EM | Admit: 2022-07-27 | Discharge: 2022-07-30 | DRG: 178 | Disposition: A | Discharge status: Home / Self Care | Payer: Medicare HMO | Attending: Internal Medicine | Admitting: Internal Medicine

## 2022-07-27 ENCOUNTER — Telehealth: Payer: Self-pay | Admitting: Family Medicine

## 2022-07-27 ENCOUNTER — Ambulatory Visit: Payer: Medicare HMO | Admitting: Family Medicine

## 2022-07-27 ENCOUNTER — Other Ambulatory Visit: Payer: Self-pay

## 2022-07-27 DIAGNOSIS — K760 Fatty (change of) liver, not elsewhere classified: Secondary | ICD-10-CM | POA: Diagnosis not present

## 2022-07-27 DIAGNOSIS — Z79899 Other long term (current) drug therapy: Secondary | ICD-10-CM | POA: Diagnosis not present

## 2022-07-27 DIAGNOSIS — Z7901 Long term (current) use of anticoagulants: Secondary | ICD-10-CM | POA: Diagnosis not present

## 2022-07-27 DIAGNOSIS — N200 Calculus of kidney: Secondary | ICD-10-CM | POA: Diagnosis present

## 2022-07-27 DIAGNOSIS — E785 Hyperlipidemia, unspecified: Secondary | ICD-10-CM | POA: Diagnosis present

## 2022-07-27 DIAGNOSIS — J449 Chronic obstructive pulmonary disease, unspecified: Secondary | ICD-10-CM | POA: Diagnosis present

## 2022-07-27 DIAGNOSIS — Z7985 Long-term (current) use of injectable non-insulin antidiabetic drugs: Secondary | ICD-10-CM | POA: Diagnosis not present

## 2022-07-27 DIAGNOSIS — R0602 Shortness of breath: Secondary | ICD-10-CM | POA: Diagnosis not present

## 2022-07-27 DIAGNOSIS — E872 Acidosis, unspecified: Secondary | ICD-10-CM | POA: Diagnosis not present

## 2022-07-27 DIAGNOSIS — R079 Chest pain, unspecified: Secondary | ICD-10-CM | POA: Diagnosis present

## 2022-07-27 DIAGNOSIS — R0789 Other chest pain: Secondary | ICD-10-CM | POA: Diagnosis not present

## 2022-07-27 DIAGNOSIS — E1165 Type 2 diabetes mellitus with hyperglycemia: Secondary | ICD-10-CM | POA: Diagnosis not present

## 2022-07-27 DIAGNOSIS — J439 Emphysema, unspecified: Secondary | ICD-10-CM

## 2022-07-27 DIAGNOSIS — Z743 Need for continuous supervision: Secondary | ICD-10-CM | POA: Diagnosis not present

## 2022-07-27 DIAGNOSIS — R0600 Dyspnea, unspecified: Principal | ICD-10-CM

## 2022-07-27 DIAGNOSIS — E119 Type 2 diabetes mellitus without complications: Secondary | ICD-10-CM | POA: Diagnosis not present

## 2022-07-27 DIAGNOSIS — E860 Dehydration: Secondary | ICD-10-CM | POA: Diagnosis not present

## 2022-07-27 DIAGNOSIS — G4733 Obstructive sleep apnea (adult) (pediatric): Secondary | ICD-10-CM | POA: Diagnosis not present

## 2022-07-27 DIAGNOSIS — J9601 Acute respiratory failure with hypoxia: Secondary | ICD-10-CM | POA: Diagnosis present

## 2022-07-27 DIAGNOSIS — I4821 Permanent atrial fibrillation: Secondary | ICD-10-CM | POA: Diagnosis not present

## 2022-07-27 DIAGNOSIS — I5042 Chronic combined systolic (congestive) and diastolic (congestive) heart failure: Secondary | ICD-10-CM | POA: Diagnosis present

## 2022-07-27 DIAGNOSIS — Z87891 Personal history of nicotine dependence: Secondary | ICD-10-CM | POA: Diagnosis not present

## 2022-07-27 DIAGNOSIS — J690 Pneumonitis due to inhalation of food and vomit: Secondary | ICD-10-CM | POA: Diagnosis not present

## 2022-07-27 DIAGNOSIS — I110 Hypertensive heart disease with heart failure: Secondary | ICD-10-CM | POA: Diagnosis present

## 2022-07-27 DIAGNOSIS — Z955 Presence of coronary angioplasty implant and graft: Secondary | ICD-10-CM

## 2022-07-27 DIAGNOSIS — Y929 Unspecified place or not applicable: Secondary | ICD-10-CM | POA: Diagnosis not present

## 2022-07-27 DIAGNOSIS — Z823 Family history of stroke: Secondary | ICD-10-CM

## 2022-07-27 DIAGNOSIS — N3000 Acute cystitis without hematuria: Secondary | ICD-10-CM | POA: Diagnosis present

## 2022-07-27 DIAGNOSIS — R069 Unspecified abnormalities of breathing: Secondary | ICD-10-CM | POA: Diagnosis not present

## 2022-07-27 DIAGNOSIS — Z800 Family history of malignant neoplasm of digestive organs: Secondary | ICD-10-CM

## 2022-07-27 DIAGNOSIS — Z8543 Personal history of malignant neoplasm of ovary: Secondary | ICD-10-CM | POA: Diagnosis not present

## 2022-07-27 DIAGNOSIS — Z1152 Encounter for screening for COVID-19: Secondary | ICD-10-CM | POA: Diagnosis not present

## 2022-07-27 DIAGNOSIS — I2510 Atherosclerotic heart disease of native coronary artery without angina pectoris: Secondary | ICD-10-CM | POA: Diagnosis present

## 2022-07-27 DIAGNOSIS — I10 Essential (primary) hypertension: Secondary | ICD-10-CM | POA: Diagnosis present

## 2022-07-27 DIAGNOSIS — Z833 Family history of diabetes mellitus: Secondary | ICD-10-CM

## 2022-07-27 DIAGNOSIS — K219 Gastro-esophageal reflux disease without esophagitis: Secondary | ICD-10-CM | POA: Diagnosis present

## 2022-07-27 DIAGNOSIS — T50995A Adverse effect of other drugs, medicaments and biological substances, initial encounter: Secondary | ICD-10-CM | POA: Diagnosis present

## 2022-07-27 DIAGNOSIS — K5732 Diverticulitis of large intestine without perforation or abscess without bleeding: Secondary | ICD-10-CM | POA: Diagnosis not present

## 2022-07-27 DIAGNOSIS — C519 Malignant neoplasm of vulva, unspecified: Secondary | ICD-10-CM | POA: Diagnosis present

## 2022-07-27 DIAGNOSIS — Z8544 Personal history of malignant neoplasm of other female genital organs: Secondary | ICD-10-CM

## 2022-07-27 DIAGNOSIS — I482 Chronic atrial fibrillation, unspecified: Secondary | ICD-10-CM | POA: Diagnosis present

## 2022-07-27 DIAGNOSIS — K521 Toxic gastroenteritis and colitis: Secondary | ICD-10-CM | POA: Diagnosis not present

## 2022-07-27 DIAGNOSIS — Z888 Allergy status to other drugs, medicaments and biological substances status: Secondary | ICD-10-CM

## 2022-07-27 DIAGNOSIS — J9811 Atelectasis: Secondary | ICD-10-CM | POA: Diagnosis not present

## 2022-07-27 DIAGNOSIS — Z8249 Family history of ischemic heart disease and other diseases of the circulatory system: Secondary | ICD-10-CM

## 2022-07-27 DIAGNOSIS — K529 Noninfective gastroenteritis and colitis, unspecified: Secondary | ICD-10-CM | POA: Diagnosis not present

## 2022-07-27 DIAGNOSIS — R109 Unspecified abdominal pain: Secondary | ICD-10-CM | POA: Diagnosis not present

## 2022-07-27 DIAGNOSIS — Z720 Tobacco use: Secondary | ICD-10-CM | POA: Diagnosis present

## 2022-07-27 DIAGNOSIS — R0902 Hypoxemia: Secondary | ICD-10-CM | POA: Diagnosis not present

## 2022-07-27 LAB — I-STAT VENOUS BLOOD GAS, ED
Acid-Base Excess: 2 mmol/L (ref 0.0–2.0)
Bicarbonate: 28.2 mmol/L — ABNORMAL HIGH (ref 20.0–28.0)
Calcium, Ion: 1.12 mmol/L — ABNORMAL LOW (ref 1.15–1.40)
HCT: 47 % — ABNORMAL HIGH (ref 36.0–46.0)
Hemoglobin: 16 g/dL — ABNORMAL HIGH (ref 12.0–15.0)
O2 Saturation: 24 %
Potassium: 4 mmol/L (ref 3.5–5.1)
Sodium: 141 mmol/L (ref 135–145)
TCO2: 30 mmol/L (ref 22–32)
pCO2, Ven: 47.4 mmHg (ref 44–60)
pH, Ven: 7.383 (ref 7.25–7.43)
pO2, Ven: 17 mmHg — CL (ref 32–45)

## 2022-07-27 LAB — CBC WITH DIFFERENTIAL/PLATELET
Abs Immature Granulocytes: 0.09 10*3/uL — ABNORMAL HIGH (ref 0.00–0.07)
Basophils Absolute: 0 10*3/uL (ref 0.0–0.1)
Basophils Relative: 0 %
Eosinophils Absolute: 0.1 10*3/uL (ref 0.0–0.5)
Eosinophils Relative: 1 %
HCT: 48.1 % — ABNORMAL HIGH (ref 36.0–46.0)
Hemoglobin: 15.1 g/dL — ABNORMAL HIGH (ref 12.0–15.0)
Immature Granulocytes: 1 %
Lymphocytes Relative: 5 %
Lymphs Abs: 1 10*3/uL (ref 0.7–4.0)
MCH: 31.2 pg (ref 26.0–34.0)
MCHC: 31.4 g/dL (ref 30.0–36.0)
MCV: 99.4 fL (ref 80.0–100.0)
Monocytes Absolute: 1.5 10*3/uL — ABNORMAL HIGH (ref 0.1–1.0)
Monocytes Relative: 8 %
Neutro Abs: 16.7 10*3/uL — ABNORMAL HIGH (ref 1.7–7.7)
Neutrophils Relative %: 85 %
Platelets: 229 10*3/uL (ref 150–400)
RBC: 4.84 MIL/uL (ref 3.87–5.11)
RDW: 13.2 % (ref 11.5–15.5)
WBC: 19.4 10*3/uL — ABNORMAL HIGH (ref 4.0–10.5)
nRBC: 0 % (ref 0.0–0.2)

## 2022-07-27 LAB — COMPREHENSIVE METABOLIC PANEL
ALT: 25 U/L (ref 0–44)
AST: 22 U/L (ref 15–41)
Albumin: 3.8 g/dL (ref 3.5–5.0)
Alkaline Phosphatase: 53 U/L (ref 38–126)
Anion gap: 13 (ref 5–15)
BUN: 20 mg/dL (ref 8–23)
CO2: 25 mmol/L (ref 22–32)
Calcium: 9.2 mg/dL (ref 8.9–10.3)
Chloride: 102 mmol/L (ref 98–111)
Creatinine, Ser: 1.32 mg/dL — ABNORMAL HIGH (ref 0.44–1.00)
GFR, Estimated: 41 mL/min — ABNORMAL LOW (ref >60)
Glucose, Bld: 186 mg/dL — ABNORMAL HIGH (ref 70–99)
Potassium: 3.9 mmol/L (ref 3.5–5.1)
Sodium: 140 mmol/L (ref 135–145)
Total Bilirubin: 0.9 mg/dL (ref 0.3–1.2)
Total Protein: 7.9 g/dL (ref 6.5–8.1)

## 2022-07-27 LAB — URINALYSIS, W/ REFLEX TO CULTURE (INFECTION SUSPECTED)
Bilirubin Urine: NEGATIVE
Glucose, UA: >=500 mg/dL — AB
Hgb urine dipstick: NEGATIVE
Ketones, ur: NEGATIVE mg/dL
Leukocytes,Ua: NEGATIVE
Nitrite: NEGATIVE
Protein, ur: NEGATIVE mg/dL
Specific Gravity, Urine: 1.008 (ref 1.005–1.030)
pH: 5 (ref 5.0–8.0)

## 2022-07-27 LAB — I-STAT CHEM 8, ED
BUN: 25 mg/dL — ABNORMAL HIGH (ref 8–23)
Calcium, Ion: 1.12 mmol/L — ABNORMAL LOW (ref 1.15–1.40)
Chloride: 106 mmol/L (ref 98–111)
Creatinine, Ser: 1.4 mg/dL — ABNORMAL HIGH (ref 0.44–1.00)
Glucose, Bld: 186 mg/dL — ABNORMAL HIGH (ref 70–99)
HCT: 48 % — ABNORMAL HIGH (ref 36.0–46.0)
Hemoglobin: 16.3 g/dL — ABNORMAL HIGH (ref 12.0–15.0)
Potassium: 4 mmol/L (ref 3.5–5.1)
Sodium: 142 mmol/L (ref 135–145)
TCO2: 26 mmol/L (ref 22–32)

## 2022-07-27 LAB — TROPONIN I (HIGH SENSITIVITY)
Troponin I (High Sensitivity): 11 ng/L (ref <18)
Troponin I (High Sensitivity): 12 ng/L (ref <18)

## 2022-07-27 LAB — BRAIN NATRIURETIC PEPTIDE: B Natriuretic Peptide: 224.8 pg/mL — ABNORMAL HIGH (ref 0.0–100.0)

## 2022-07-27 LAB — SARS CORONAVIRUS 2 BY RT PCR: SARS Coronavirus 2 by RT PCR: NEGATIVE

## 2022-07-27 LAB — LACTIC ACID, PLASMA
Lactic Acid, Venous: 2.3 mmol/L (ref 0.5–1.9)
Lactic Acid, Venous: 2.5 mmol/L (ref 0.5–1.9)

## 2022-07-27 MED ORDER — MELATONIN 5 MG PO TABS
5.0000 mg | ORAL_TABLET | Freq: Every day | ORAL | Status: DC
  Administered 2022-07-28 – 2022-07-29 (×3): 5 mg via ORAL
  Filled 2022-07-27 (×3): qty 1

## 2022-07-27 MED ORDER — ONDANSETRON HCL 4 MG/2ML IJ SOLN
INTRAMUSCULAR | Status: AC
  Administered 2022-07-27: 4 mg via INTRAVENOUS
  Filled 2022-07-27: qty 2

## 2022-07-27 MED ORDER — TRAZODONE HCL 50 MG PO TABS: 50.0000 mg | ORAL_TABLET | Freq: Every evening | ORAL | Status: DC | PRN

## 2022-07-27 MED ORDER — METOPROLOL TARTRATE 50 MG PO TABS
150.0000 mg | ORAL_TABLET | Freq: Two times a day (BID) | ORAL | Status: DC
  Administered 2022-07-27 – 2022-07-30 (×6): 150 mg via ORAL
  Filled 2022-07-27: qty 3
  Filled 2022-07-27: qty 6
  Filled 2022-07-27 (×4): qty 3

## 2022-07-27 MED ORDER — SODIUM CHLORIDE 0.9 % IV BOLUS
1000.0000 mL | Freq: Once | INTRAVENOUS | Status: AC
  Administered 2022-07-27: 1000 mL via INTRAVENOUS

## 2022-07-27 MED ORDER — VANCOMYCIN HCL 1500 MG/300ML IV SOLN
1500.0000 mg | Freq: Once | INTRAVENOUS | Status: AC
  Administered 2022-07-27: 1500 mg via INTRAVENOUS
  Filled 2022-07-27: qty 300

## 2022-07-27 MED ORDER — SODIUM CHLORIDE 0.9 % IV BOLUS
500.0000 mL | Freq: Once | INTRAVENOUS | Status: AC
  Administered 2022-07-27: 500 mL via INTRAVENOUS

## 2022-07-27 MED ORDER — INSULIN ASPART 100 UNIT/ML IJ SOLN: 0.0000 [IU] | Freq: Three times a day (TID) | INTRAMUSCULAR | Status: DC

## 2022-07-27 MED ORDER — CEFTRIAXONE SODIUM 2 G IJ SOLR
2.0000 g | INTRAMUSCULAR | Status: DC
Start: 2022-07-28 — End: 2022-07-27

## 2022-07-27 MED ORDER — HEPARIN SODIUM (PORCINE) 5000 UNIT/ML IJ SOLN: 5000.0000 [IU] | Freq: Three times a day (TID) | INTRAMUSCULAR | Status: DC

## 2022-07-27 MED ORDER — IPRATROPIUM BROMIDE 0.02 % IN SOLN: 0.5000 mg | RESPIRATORY_TRACT | Status: DC | PRN

## 2022-07-27 MED ORDER — ONDANSETRON HCL 4 MG/2ML IJ SOLN: 4.0000 mg | Freq: Four times a day (QID) | INTRAMUSCULAR | Status: DC | PRN

## 2022-07-27 MED ORDER — LACTATED RINGERS IV SOLN: INTRAVENOUS | Status: AC

## 2022-07-27 MED ORDER — RIVAROXABAN 15 MG PO TABS
15.0000 mg | ORAL_TABLET | Freq: Every day | ORAL | Status: DC
  Administered 2022-07-28 – 2022-07-29 (×2): 15 mg via ORAL
  Filled 2022-07-27 (×3): qty 1

## 2022-07-27 MED ORDER — PIPERACILLIN-TAZOBACTAM 3.375 G IVPB
3.3750 g | Freq: Three times a day (TID) | INTRAVENOUS | Status: AC
  Administered 2022-07-28 – 2022-07-29 (×6): 3.375 g via INTRAVENOUS
  Filled 2022-07-27 (×6): qty 50

## 2022-07-27 MED ORDER — ONDANSETRON HCL 4 MG/2ML IJ SOLN: 4.0000 mg | Freq: Once | INTRAMUSCULAR | Status: AC

## 2022-07-27 MED ORDER — IOHEXOL 350 MG/ML SOLN
60.0000 mL | Freq: Once | INTRAVENOUS | Status: AC | PRN
  Administered 2022-07-27: 60 mL via INTRAVENOUS

## 2022-07-27 MED ORDER — INSULIN ASPART 100 UNIT/ML IJ SOLN: 0.0000 [IU] | Freq: Every day | INTRAMUSCULAR | Status: DC

## 2022-07-27 MED ORDER — AZITHROMYCIN 500 MG IV SOLR
500.0000 mg | INTRAVENOUS | Status: DC
Start: 2022-07-28 — End: 2022-07-27

## 2022-07-27 MED ORDER — VANCOMYCIN HCL IN DEXTROSE 1-5 GM/200ML-% IV SOLN: 1000.0000 mg | INTRAVENOUS | Status: DC

## 2022-07-27 MED ORDER — SODIUM CHLORIDE 0.9 % IV SOLN
2.0000 g | Freq: Two times a day (BID) | INTRAVENOUS | Status: DC
  Administered 2022-07-27: 2 g via INTRAVENOUS
  Filled 2022-07-27: qty 12.5

## 2022-07-27 NOTE — Telephone Encounter (Signed)
FYI: This call has been transferred to Access Nurse. Once the result note has been entered staff can address the message at that time.  Patient called in with the following symptoms:  Red Word: O2 is at 87, heart is racing  Pt has an appointment at 3pm today  Please advise at [facility] [phone]  Message is routed to Provider Pool and [facility] Triage

## 2022-07-27 NOTE — Telephone Encounter (Signed)
Called the patients granddaughter and they had already called EMS. Will cancel her appointment in our office today.

## 2022-07-27 NOTE — Telephone Encounter (Signed)
Initial Comment Caller states her grandmothers O2 was 19, shortness of breath, she has a headache, and has vomited. She is also having chest pain. Translation No Nurse Assessment Nurse: [place], RN, Marchelle Folks Date/Time (Eastern Time): 07/27/2022 1:25:51 PM Confirm and document reason for call. If symptomatic, describe symptoms. ---caller states her grandmother has chest pain, vomiting, shortness of breath. saw pcp a couple weeks ago due to UTI and abx were changed after that. has had diarrhea. hands ice cold. off balance. oxygen 87% Does the patient have any new or worsening symptoms? ---Yes Will a triage be completed? ---Yes Related visit to physician within the last 2 weeks? ---Yes Does the PT have any chronic conditions? (i.e. diabetes, asthma, this includes High risk factors for pregnancy, etc.) ---Yes List chronic conditions. ---heart problems, CHF, afib Is this a behavioral health or substance abuse call? ---No Guidelines Guideline Title Affirmed Question Affirmed Notes Nurse Date/Time (Eastern Time) Chest Pain SEVERE difficulty breathing (e.g., struggling for each breath, speaks in single words) [place], RN, Marchelle Folks 07/27/2022 1:27:56 PM PLEASE NOTE: All timestamps contained within this report are represented as [place] Standard Time. CONFIDENTIALTY NOTICE: This fax transmission is intended only for the addressee. It contains information that is legally privileged, confidential or otherwise protected from use or disclosure. If you are not the intended recipient, you are strictly prohibited from reviewing, disclosing, copying using or disseminating any of this information or taking any action in reliance on or regarding this information. If you have received this fax in error, please notify us immediately by telephone so that we can arrange for its return to [place]. Phone: [phone], Toll-Free: [phone], Fax: [phone] Page: 2 of 2 Call Id: [id number] Disp. Time  Lamount Cohen Time) Disposition Final User 07/27/2022 1:25:07 PM Send to Urgent Queue Asencion Gowda 07/27/2022 1:29:29 PM Call EMS 911 Now Yes [place], RN, Marchelle Folks 07/27/2022 1:35:48 PM 911 Outcome Documentation [place], RN, Marchelle Folks Reason: went to voice mail on 911 call back Final Disposition 07/27/2022 1:29:29 PM Call EMS 911 Now Yes [place], RN, Earnestine Leys Disagree/Comply Comply Caller Understands Yes PreDisposition Did not know what to do Care Advice Given Per Guideline CALL EMS 911 NOW: * Immediate medical attention is needed. You need to hang up and call 911 (or an ambulance). CARE ADVICE given per Chest Pain (Adult) guideline

## 2022-07-27 NOTE — Progress Notes (Signed)
Pharmacy Antibiotic Note  Holly Smith is a 77 y.o. female admitted on 07/27/2022 presenting with SOB, concern for pna.  Pharmacy has been consulted for zosyn dosing.  Given vancomycin and cefepime in ED  Plan: Zosyn 3.375g IV every 8 hours (extended 4h infusion) Monitor renal function, clinical progression and LOT   Height: 5' (152.4 cm) Weight: 90.7 kg (200 lb) IBW/kg (Calculated) : 45.5  Temp (24hrs), Avg:98.5 F (36.9 C), Min:98.4 F (36.9 C), Max:98.5 F (36.9 C)  Recent Labs  Lab 07/27/22 1545 07/27/22 1551 07/27/22 2025  WBC 19.4*  --   --   CREATININE 1.32* 1.40*  --   LATICACIDVEN 2.3*  --  2.5*    Estimated Creatinine Clearance: 33.3 mL/min (A) (by C-G formula based on SCr of 1.4 mg/dL (H)).    Allergies  Allergen Reactions   Ciprofloxacin Other (See Comments)    SOB    Daylene Posey, PharmD, [facility] Clinical Pharmacist ED Pharmacist Phone # [phone] 07/27/2022 10:42 PM

## 2022-07-27 NOTE — Telephone Encounter (Signed)
Called the granddaughter and she had called EMS.

## 2022-07-27 NOTE — H&P (Signed)
PCP:   [name], DO   Chief Complaint:  Abdominal pain  HPI: This is a 77 year old female with past medical history of permanent atrial fibrillation, CAD, chronic systolic heart failure, COPD, HTN, HLD DM type 2, IBS diarrheal type, and h/o staghorn calculus.  2 weeks ago patient went to PCP, diagnosed with UTI.  She had burning urination and bilateral flank pain.  Per family she was prescribed Keflex.  She was called by PCP who told her cephalexin to cover the bacteria that grew.  Her antibiotic was changed to nitrofurantoin.  She is seeing the medication for the past 2 days and has not improved.  It has been very sick with nausea, vomiting, diarrhea.  Of note patient has IBS, diarrhea noted.  Presently she takes Benefiber for this.  On 4/27 was re-started on Trulicity for diabetes.  Patient previously was on Trulicity, it was discontinued due to GI side effects of nausea, vomiting and diarrhea.  Her nausea, vomiting and diarrhea has worsened.  She goes several times daily both yesterday and today.  Patient endorses nausea, vomiting, diarrhea, dry heaving, weakness, shortness of breath, hyperglycemia [245], burning urination, and bilateral flank/ pains.  Her son brought her to the ER.  Aside from antibiotic steroid medication change included decrease in her Xarelto due to her decreased GFR.  In the ER patient is hypoxic 87%.  She has been placed on 2 L [place].  CTA chest negative for PE, positive for platelike consolidation in the lingula and left lower lobe, favor atelectasis.  UA with rare bacteria.  CT renal stone study read as possible mild descending colon diverticulitis/colitis.  Lactic acid 2.5, WBC 19.4  History provided by patient, daughter and son present at bedside.  Review of Systems:  Per HPI  Past Medical History: Past Medical History:  Diagnosis Date   A-fib (HCC)    Abnormal Pap smear of vagina and vaginal HPV    Acute diastolic CHF (congestive heart failure) (HCC)  01/21/2015   Acute diverticulitis 01/28/2015   Acute on chronic diastolic heart failure (HCC) 01/21/2015   Anticoagulation adequate 08/02/2014   Overview:  Xarelto 20hs; CHADS=1+ htn, female, age  Overview:  Xarelto 20hs; CHADS=1+ htn, female, age   Arthritis    "bones ache" (01/23/2015)   Atrial fibrillation, persistent (HCC) 08/13/2015   Overview:  symptomatic (fatigue, DOE, decreased exercise tolerance, palpitations), drug refractory (propafenone, amiodarone), complicated recovery from DCCV possibly related to repiratory obstruction [facility] (patient fear of DCCV)  Overview:  symptomatic (fatigue, DOE, decreased exercise tolerance, palpitations), drug refractory (propafenone, amiodarone), complicated recovery from DCCV possibly relat   Benign neoplasm of ascending colon    Benign neoplasm of descending colon    Benign neoplasm of sigmoid colon    Blood in stool 01/21/2015   Breath shortness 08/20/2013   CAFL (chronic airflow limitation) (HCC) 12/16/2014   Cancer (HCC)    vulvar cancer   CCF (congestive cardiac failure) (HCC) 08/03/2013   Overview:  ACUTE SYSTOLIC    Chest pain 08/20/2013   CHF (congestive heart failure) (HCC)    CHF (congestive heart failure) (HCC)    Chronic anticoagulation    She is on Xarelto for Afib   Chronic atrial fibrillation (HCC)    Chronic combined systolic and diastolic CHF, NYHA class 3 (HCC) 01/28/2015   Chronic diastolic heart failure (HCC) 05/05/2015   Condyloma acuminatum of vulva 08/02/2014   COPD (chronic obstructive pulmonary disease) (HCC) 08/13/2015   Diverticulitis large intestine w/o perforation or abscess  w/bleeding 01/28/2015   Diverticulitis of large intestine without perforation or abscess with bleeding    Dysmetabolic syndrome 12/16/2014   Overview:  BG-166 06/15/2013; elevated BMI; central obesity    Dyspnea    Echocardiogram abnormal    Mitral regurgitation, tricuspic regurgitation   Essential hypertension 10/07/2014   Trial off acei  10/07/2014  Due to atypical sob (talking = walking)> no real change 11/18/2014     Family history of adverse reaction to anesthesia    sister gets very nauseated and vomits   Gastritis without bleeding    GERD (gastroesophageal reflux disease)    History of kidney stones    History of stress test    Myoview 8/16: EF 42%, anteroseptal, inferoseptal, anterior, apical anterior and apical defect (likely represents breast attenuation versus scar), no ischemia; intermediate risk because of low EF   HPV (human papilloma virus) anogenital infection    03/21/12 PAP + HR HPV   HTN (hypertension) 12/16/2014   Hypertension    Hypertensive heart disease 05/12/2016   Hypertensive urgency 01/21/2015   Insulin resistance    Kidney stones    Malignant neoplasm of vulva (HCC) 06/19/2014   Overview:  [name] is a 77 yo who was referred by Dr Feliberto Gottron for a vulvar lesion. 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 The specimen had negative margins, but was within 2 mm of the cancer.     Mitral regurgitation 04/23/2013   Overview:  "Moderate to severe" by TTE, "mild to mod" by TEE  Overview:  "Moderate to severe" by TTE, "mild to mod" by TEE   Obesity    OSA (obstructive sleep apnea) 11/27/2014   07/2013 RDI 17/h CPAP 8    Other specified diseases of esophagus    Postoperative nausea and vomiting 1983   many years ago with hysterectomy   PVC (premature ventricular contraction) 12/16/2014   TI (tricuspid incompetence) 04/23/2013   Overview:  "moderate" by TTE    Urolithiasis    Vertigo    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 History of genital warts and vulvar condyloma   Vulvar cancer, carcinoma (HCC) 05/10/2014   Past Surgical History:  Procedure Laterality Date   ABDOMINAL HYSTERECTOMY     APPENDECTOMY     BILATERAL SALPINGOOPHORECTOMY  2003   benign ovarian cancer    CARDIOVERSION  2014-2015   "[facility]"   COLONOSCOPY WITH PROPOFOL N/A 06/10/2015   Procedure: COLONOSCOPY WITH PROPOFOL;  Surgeon: Midge Minium, MD;  Location: [facility] ENDOSCOPY;  Service: Endoscopy;  Laterality: N/A;   CORONARY STENT INTERVENTION N/A 01/18/2019   Procedure: CORONARY STENT INTERVENTION;  Surgeon: Corky Crafts, MD;  Location: [facility] INVASIVE CV LAB;  Service: Cardiovascular;  Laterality: N/A;   CYSTOSCOPY W/ RETROGRADES Bilateral 10/19/2016   Procedure: CYSTOSCOPY WITH RETROGRADE PYELOGRAM;  Surgeon: Vanna Scotland, MD;  Location: [facility] ORS;  Service: Urology;  Laterality: Bilateral;   CYSTOSCOPY W/ URETERAL STENT PLACEMENT Left 12/29/2016   Procedure: CYSTOSCOPY WITH STENT REPLACEMENT;  Surgeon: Vanna Scotland, MD;  Location: [facility] ORS;  Service: Urology;  Laterality: Left;   CYSTOSCOPY WITH BIOPSY N/A 10/19/2016   Procedure: CYSTOSCOPY WITH BLADDER BIOPSY, VAGINAL WALL BIOPSY;  Surgeon: Vanna Scotland, MD;  Location: [facility] ORS;  Service: Urology;  Laterality: N/A;   CYSTOSCOPY WITH STENT PLACEMENT Left 12/14/2016   Procedure: CYSTOSCOPY WITH STENT PLACEMENT;  Surgeon: Vanna Scotland, MD;  Location: [facility]  ORS;  Service: Urology;  Laterality: Left;   ESOPHAGOGASTRODUODENOSCOPY (EGD) WITH PROPOFOL N/A 10/05/2016   Surgeon: Midge Minium, MD;  Location: [facility] ENDOSCOPY;  Results: Barrett's Esophagus- repeat in 3 years 09/2019   LEFT HEART CATH AND CORONARY ANGIOGRAPHY N/A 01/18/2019   Procedure: LEFT HEART CATH AND CORONARY ANGIOGRAPHY;  Surgeon: Corky Crafts, MD;  Location: [facility] INVASIVE CV LAB;  Service: Cardiovascular;  Laterality: N/A;   LITHOTRIPSY     STONE EXTRACTION WITH BASKET Left 12/29/2016   Procedure: STONE EXTRACTION WITH BASKET;  Surgeon: Vanna Scotland, MD;  Location: [facility] ORS;  Service: Urology;  Laterality: Left;   TONSILLECTOMY     URETEROSCOPY Left 12/29/2016   Procedure: URETEROSCOPY;  Surgeon: Vanna Scotland, MD;  Location: [facility] ORS;  Service:  Urology;  Laterality: Left;   VULVECTOMY Right 05/10/2014   Excisional biopsy of the superior right labial majora mass; [facility]   VULVECTOMY PARTIAL  07/02/2014   Re-excision and sentinel node dissection at [facility].     Medications: Prior to Admission medications   Medication Sig Start Date End Date Taking? Authorizing Provider  BIOTIN PO Take 1 tablet by mouth daily.     [provider]  cephALEXin (KEFLEX) 500 MG capsule Take 1 capsule (500 mg total) by mouth 3 (three) times daily for 7 days. 07/16/22 07/23/22  [name], DO  Cholecalciferol (VITAMIN D3) 1000 units CAPS Take by mouth.    [provider]  diclofenac Sodium (VOLTAREN) 1 % GEL Apply topically. 12/19/20   [provider]  Dulaglutide (TRULICITY) 0.75 MG/0.5ML SOPN Inject 0.75 mg into the skin once a week for 28 days. 07/16/22 08/13/22  [name], DO  Dulaglutide (TRULICITY) 1.5 MG/0.5ML SOPN Inject 1.5 mg into the skin once a week. 09/10/22   Wendling, [name], DO  FARXIGA 10 MG TABS tablet Take 10 mg by mouth daily. 12/25/20   [provider]  fluticasone (FLONASE) 50 MCG/ACT nasal spray Place 2 sprays into both nostrils daily. 12/25/19   Daphine Deutscher, Mary-Margaret, FNP  irbesartan (AVAPRO) 75 MG tablet TAKE 1 TABLET BY MOUTH EVERY DAY 12/03/19   Corky Crafts, MD  metoprolol tartrate (LOPRESSOR) 100 MG tablet TAKE 1 & 1/2 TABS (150MG ) BY MOUTH 2 (TWO) TIMES DAILY. 04/27/22   Corky Crafts, MD  nitroGLYCERIN (NITROSTAT) 0.4 MG SL tablet Place 1 tablet (0.4 mg total) under the tongue every 5 (five) minutes as needed. 01/18/19   Arty Baumgartner, NP  [facility] VERIO test strip 1 each 3 (three) times daily. 02/26/21   [provider]  pantoprazole (PROTONIX) 40 MG tablet Take 1 tablet (40 mg total) by mouth daily. 03/16/22   Corky Crafts, MD  Rivaroxaban (XARELTO) 15 MG TABS tablet Take 1 tablet (15 mg total) by mouth daily with supper.  10/26/21   Corky Crafts, MD  torsemide (DEMADEX) 20 MG tablet Take 2 tablets (40 mg) PO daily 06/09/22   Corky Crafts, MD  TOVIAZ 4 MG TB24 tablet Take 4 mg by mouth daily. 02/19/21   [provider]  traZODone (DESYREL) 50 MG tablet Take 1 tablet (50 mg total) by mouth at bedtime as needed for sleep. 05/25/22   [name], DO  vitamin B-12 (CYANOCOBALAMIN) 1000 MCG tablet Take 1,000 mcg by mouth every other day.     [provider]  potassium chloride SA (K-DUR,KLOR-CON) 20 MEQ tablet Take 20 mEq by mouth daily.  07/30/19  [provider]    Allergies:  Allergies  Allergen Reactions   Ciprofloxacin Other (See Comments)    SOB    Social History:  reports that she quit smoking about 10 years ago. Her smoking use included cigarettes. She has a 50.00 pack-year smoking history. She has never used smokeless tobacco. She reports that she does not drink alcohol and does not use drugs.  Family History: Family History  Problem Relation Age of Onset   Prostate cancer Brother 10       still living and well   Heart attack Father    Stroke Mother    Diabetes Mother    Hypertension Mother    Heart attack Brother    Pancreatic cancer Brother    Heart disease Sister    Breast cancer Neg Hx    Kidney cancer Neg Hx    Bladder Cancer Neg Hx     Physical Exam: Vitals:   07/27/22 1715 07/27/22 1730 07/27/22 1832 07/27/22 2026  BP: (!) 155/82 (!) 165/72 (!) 153/107 (!) 148/99  Pulse: 93 (!) 104 98 (!) 114  Resp: (!) 33 (!) 30 (!) 33 (!) 32  Temp:    98.5 F (36.9 C)  TempSrc:    Oral  SpO2: 98% 99% 95% 95%  Weight:      Height:        General:  Alert and oriented times three, well developed and nourished, no acute distress.  Weak appearing female Eyes: Pink conjunctiva, no scleral icterus ENT: Dry oral mucosa, neck supple, no thyromegaly Lungs: Dry crackles posteriorly, no wheeze, no use of accessory muscles Cardiovascular:  Tachycardia, irregularly irregular rate and irregular rhythm, no regurgitation, no gallops, no murmurs. No carotid bruits, no JVD Abdomen: soft, positive BS, obese abdomen, nonspecific TTP, not an acute abdomen GU: not examined Neuro: CN II - XII grossly intact Musculoskeletal: strength 5/5 all extremities, no clubbing, cyanosis or edema Skin: no rash, no subcutaneous crepitation, no decubitus Psych: appropriate patient  Labs on Admission:  Recent Labs    07/27/22 1545 07/27/22 1551  NA 140 142  141  K 3.9 4.0  4.0  CL 102 106  CO2 25  --   GLUCOSE 186* 186*  BUN 20 25*  CREATININE 1.32* 1.40*  CALCIUM 9.2  --    Recent Labs    07/27/22 1545  AST 22  ALT 25  ALKPHOS 53  BILITOT 0.9  PROT 7.9  ALBUMIN 3.8    Recent Labs    07/27/22 1545 07/27/22 1551  WBC 19.4*  --   NEUTROABS 16.7*  --   HGB 15.1* 16.3*  16.0*  HCT 48.1* 48.0*  47.0*  MCV 99.4  --   PLT 229  --      Micro Results: Recent Results (from the past 240 hour(s))  SARS Coronavirus 2 by RT PCR (hospital order, performed in [facility] hospital lab) *cepheid single result test* Urine, Clean Catch     Status: None   Collection Time: 07/27/22  3:12 PM   Specimen: Urine, Clean Catch; Nasal Swab  Result Value Ref Range Status   SARS Coronavirus 2 by RT PCR NEGATIVE NEGATIVE Final    Comment: Performed at [facility], [address], [place], [place] [address]     Radiological Exams on Admission: CT Angio Chest PE W and/or Wo Contrast  Result Date: 07/27/2022 CLINICAL DATA:  Dyspnea/hypoxia PE suspected EXAM: CT ANGIOGRAPHY CHEST WITH CONTRAST TECHNIQUE: Multidetector CT imaging of the chest was performed using the standard protocol during bolus  administration of intravenous contrast. Multiplanar CT image reconstructions and MIPs were obtained to evaluate the vascular anatomy. RADIATION DOSE REDUCTION: This exam was performed according to the departmental dose-optimization program which  includes automated exposure control, adjustment of the mA and/or kV according to patient size and/or use of iterative reconstruction technique. CONTRAST:  60mL OMNIPAQUE IOHEXOL 350 MG/ML SOLN COMPARISON:  Chest radiographs 07/27/2022 and CTA chest 12/15/2016 FINDINGS: Cardiovascular: Satisfactory opacification of the pulmonary arteries to the segmental level. No pulmonary embolism. Coronary artery and aortic atherosclerotic calcification. Borderline cardiomegaly. No pericardial effusion. Mediastinum/Nodes: Pretracheal node measuring 1.1 cm subcentimeter subcarinal nodes. These are unchanged from 2018 and likely reactive. Small hiatal hernia. Lungs/Pleura: Respiratory motion obscures fine detail in the lung bases. Platelike consolidation in the lingula and left lower lobe. Mild pleural thickening in the posterior left lower lung. No pleural effusion. No pneumothorax. The central airways are patent. Upper Abdomen: No acute abnormality. Musculoskeletal: No acute fracture. Review of the MIP images confirms the above findings. IMPRESSION: 1. No evidence of acute pulmonary embolism. 2. Platelike consolidation in the lingula and left lower lobe, favor atelectasis. 3. Small hiatal hernia. 4. Coronary artery calcification. Aortic Atherosclerosis (ICD10-I70.0). Electronically Signed   By: Minerva Fester M.D.   On: 07/27/2022 20:10   DG Chest Port 1 View  Result Date: 07/27/2022 CLINICAL DATA:  Shortness of breath, chest pain EXAM: PORTABLE CHEST 1 VIEW COMPARISON:  03/18/2022 FINDINGS: Cardiomegaly. Heterogeneous opacity of the peripheral left lung base. The visualized skeletal structures are unremarkable. IMPRESSION: 1. Cardiomegaly. 2. Heterogeneous opacity of the peripheral left lung base, concerning for infection or aspiration. Consider PA and lateral radiographs or CT to further evaluate. Electronically Signed   By: Jearld Lesch M.D.   On: 07/27/2022 15:52    Assessment/Plan Present on Admission:  Acute  respiratory failure  Platelike atelectasis, suspect pneumonia/ Lactic acidosis -Continue 2 L oxygen via nasal cannula -Pneumonia order set initiated -Blood cultures, sputum culture -Urinary Legionella, strep pneumonia ordered -IV Zosyn ordered -IV fluid hydration -Follow lactic acid curve until normalized   Gastroenteritis -C. difficile panel, GI bio fire ordered -Likely multifactorial, due to Benefiber, Trulicity, UTI -Patient recommended to discontinue Benefiber -Trulicity will not be reinitiated as it is known to cause GI upset in this patient, including nausea vomiting, diarrhea -Protonix on hold since used as related to C. difficile infection.  Awaiting CT of cultures  ?UTI vs pyelonephritis -Ongoing bilateral low back pain/flank pain which is new per family -Patient's cultures positive for E. Coli 07/16/22.   -Will also be treated by IV Zosyn   Atrial fibrillation, persistent (HCC) currently poorly controlled -Patient's p.o. metoprolol 50 mg p.o. twice daily resumed -Xarelto resumed -RVR likely caused by dehydration, gastroenteritis   CAD (coronary artery disease)/ Essential hypertension  Chest pain -Resume metoprolol, Nitro sublingual, Farxiga -Irbesartan, Demadex, on hold with patient's dehydration/acute on chronic kidney injury -Troponin 11 => 12 -Likely costochondritis related dry heaves, nausea or vomiting.   Diabetes mellitus type 2 -Weekly Trulicity last taken on Saturday.  Family just discussed medication with PCP -Sliding scale insulin   Chronic combined systolic and diastolic CHF, NYHA class 3 (HCC) -Patient's Demadex on hold -Lopressor, Farxiga, Xarelto resumed   GERD -Protonix on hold   H/o COPD (chronic obstructive pulmonary disease) (HCC)  H/o Vulvar cancer, carcinoma (HCC)   Holly Smith 07/27/2022, 9:02 PM

## 2022-07-27 NOTE — ED Notes (Signed)
ED TO INPATIENT HANDOFF REPORT  ED Nurse Name and Phone #: [phone]  S Name/Age/Gender Holly Smith 77 y.o. female Room/Bed: 027C/027C  Code Status   Code Status: Full Code  Home/SNF/Other Home Patient oriented to: self, place, time, and situation Is this baseline? Yes   Triage Complete: Triage complete  Chief Complaint Acute cystitis [N30.00]  Triage Note No notes on file   Allergies Allergies  Allergen Reactions   Ciprofloxacin Other (See Comments)    SOB    Level of Care/Admitting Diagnosis ED Disposition     ED Disposition  Admit   Condition  --   Comment  Hospital Area: [facility] [[id number]]  Level of Care: Telemetry Medical [104]  May admit patient to [facility] or [facility] if equivalent level of care is available:: Yes  Covid Evaluation: Confirmed COVID Negative  Diagnosis: Acute cystitis [595.0.ICD-9-CM]  Admitting Physician: Gery Pray [[id number]]  Attending Physician: Gery Pray [phone]  Certification:: I certify this patient will need inpatient services for at least 2 midnights  Estimated Length of Stay: 2          B Medical/Surgery History Past Medical History:  Diagnosis Date   A-fib (HCC)    Abnormal Pap smear of vagina and vaginal HPV    Acute diastolic CHF (congestive heart failure) (HCC) 01/21/2015   Acute diverticulitis 01/28/2015   Acute on chronic diastolic heart failure (HCC) 01/21/2015   Anticoagulation adequate 08/02/2014   Overview:  Xarelto 20hs; CHADS=1+ htn, female, age  Overview:  Xarelto 20hs; CHADS=1+ htn, female, age   Arthritis    "bones ache" (01/23/2015)   Atrial fibrillation, persistent (HCC) 08/13/2015   Overview:  symptomatic (fatigue, DOE, decreased exercise tolerance, palpitations), drug refractory (propafenone, amiodarone), complicated recovery from DCCV possibly related to repiratory obstruction [facility] (patient fear of DCCV)  Overview:  symptomatic (fatigue, DOE, decreased exercise  tolerance, palpitations), drug refractory (propafenone, amiodarone), complicated recovery from DCCV possibly relat   Benign neoplasm of ascending colon    Benign neoplasm of descending colon    Benign neoplasm of sigmoid colon    Blood in stool 01/21/2015   Breath shortness 08/20/2013   CAFL (chronic airflow limitation) (HCC) 12/16/2014   Cancer (HCC)    vulvar cancer   CCF (congestive cardiac failure) (HCC) 08/03/2013   Overview:  ACUTE SYSTOLIC    Chest pain 08/20/2013   CHF (congestive heart failure) (HCC)    CHF (congestive heart failure) (HCC)    Chronic anticoagulation    She is on Xarelto for Afib   Chronic atrial fibrillation (HCC)    Chronic combined systolic and diastolic CHF, NYHA class 3 (HCC) 01/28/2015   Chronic diastolic heart failure (HCC) 05/05/2015   Condyloma acuminatum of vulva 08/02/2014   COPD (chronic obstructive pulmonary disease) (HCC) 08/13/2015   Diverticulitis large intestine w/o perforation or abscess w/bleeding 01/28/2015   Diverticulitis of large intestine without perforation or abscess with bleeding    Dysmetabolic syndrome 12/16/2014   Overview:  BG-166 06/15/2013; elevated BMI; central obesity    Dyspnea    Echocardiogram abnormal    Mitral regurgitation, tricuspic regurgitation   Essential hypertension 10/07/2014   Trial off acei 10/07/2014  Due to atypical sob (talking = walking)> no real change 11/18/2014     Family history of adverse reaction to anesthesia    sister gets very nauseated and vomits   Gastritis without bleeding    GERD (gastroesophageal reflux disease)    History of kidney stones  History of stress test    Myoview 8/16: EF 42%, anteroseptal, inferoseptal, anterior, apical anterior and apical defect (likely represents breast attenuation versus scar), no ischemia; intermediate risk because of low EF   HPV (human papilloma virus) anogenital infection    03/21/12 PAP + HR HPV   HTN (hypertension) 12/16/2014   Hypertension     Hypertensive heart disease 05/12/2016   Hypertensive urgency 01/21/2015   Insulin resistance    Kidney stones    Malignant neoplasm of vulva (HCC) 06/19/2014   Overview:  Holly Smith is a 77 yo who was referred by Dr Feliberto Gottron for a vulvar lesion. 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 The specimen had negative margins, but was within 2 mm of the cancer.     Mitral regurgitation 04/23/2013   Overview:  "Moderate to severe" by TTE, "mild to mod" by TEE  Overview:  "Moderate to severe" by TTE, "mild to mod" by TEE   Obesity    OSA (obstructive sleep apnea) 11/27/2014   07/2013 RDI 17/h CPAP 8    Other specified diseases of esophagus    Postoperative nausea and vomiting 1983   many years ago with hysterectomy   PVC (premature ventricular contraction) 12/16/2014   TI (tricuspid incompetence) 04/23/2013   Overview:  "moderate" by TTE    Urolithiasis    Vertigo    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 History of genital warts and vulvar condyloma   Vulvar cancer, carcinoma (HCC) 05/10/2014   Past Surgical History:  Procedure Laterality Date   ABDOMINAL HYSTERECTOMY     APPENDECTOMY     BILATERAL SALPINGOOPHORECTOMY  2003   benign ovarian cancer   CARDIOVERSION  2014-2015   "[facility]"   COLONOSCOPY WITH PROPOFOL N/A 06/10/2015   Procedure: COLONOSCOPY WITH PROPOFOL;  Surgeon: Midge Minium, MD;  Location: [facility] ENDOSCOPY;  Service: Endoscopy;  Laterality: N/A;   CORONARY STENT INTERVENTION N/A 01/18/2019   Procedure: CORONARY STENT INTERVENTION;  Surgeon: Corky Crafts, MD;  Location: [facility] INVASIVE CV LAB;  Service: Cardiovascular;  Laterality: N/A;   CYSTOSCOPY W/ RETROGRADES Bilateral 10/19/2016   Procedure: CYSTOSCOPY WITH RETROGRADE PYELOGRAM;  Surgeon: Vanna Scotland, MD;  Location: [facility] ORS;  Service: Urology;  Laterality: Bilateral;   CYSTOSCOPY W/  URETERAL STENT PLACEMENT Left 12/29/2016   Procedure: CYSTOSCOPY WITH STENT REPLACEMENT;  Surgeon: Vanna Scotland, MD;  Location: [facility] ORS;  Service: Urology;  Laterality: Left;   CYSTOSCOPY WITH BIOPSY N/A 10/19/2016   Procedure: CYSTOSCOPY WITH BLADDER BIOPSY, VAGINAL WALL BIOPSY;  Surgeon: Vanna Scotland, MD;  Location: [facility] ORS;  Service: Urology;  Laterality: N/A;   CYSTOSCOPY WITH STENT PLACEMENT Left 12/14/2016   Procedure: CYSTOSCOPY WITH STENT PLACEMENT;  Surgeon: Vanna Scotland, MD;  Location: [facility] ORS;  Service: Urology;  Laterality: Left;   ESOPHAGOGASTRODUODENOSCOPY (EGD) WITH PROPOFOL N/A 10/05/2016   Surgeon: Midge Minium, MD;  Location: [facility] ENDOSCOPY;  Results: Barrett's Esophagus- repeat in 3 years 09/2019   LEFT HEART CATH AND CORONARY ANGIOGRAPHY N/A 01/18/2019   Procedure: LEFT HEART CATH AND CORONARY ANGIOGRAPHY;  Surgeon: Corky Crafts, MD;  Location: [facility] INVASIVE CV LAB;  Service: Cardiovascular;  Laterality: N/A;   LITHOTRIPSY     STONE EXTRACTION WITH BASKET Left 12/29/2016   Procedure: STONE EXTRACTION WITH BASKET;  Surgeon: Vanna Scotland, MD;  Location: [facility] ORS;  Service: Urology;  Laterality: Left;   TONSILLECTOMY  URETEROSCOPY Left 12/29/2016   Procedure: URETEROSCOPY;  Surgeon: Vanna Scotland, MD;  Location: [facility] ORS;  Service: Urology;  Laterality: Left;   VULVECTOMY Right 05/10/2014   Excisional biopsy of the superior right labial majora mass; [facility]   VULVECTOMY PARTIAL  07/02/2014   Re-excision and sentinel node dissection at [facility].      A IV Location/Drains/Wounds Patient Lines/Drains/Airways Status     Active Line/Drains/Airways     Name Placement date Placement time Site Days   Peripheral IV 07/27/22 20 G Left Antecubital 07/27/22  --  Antecubital  less than 1   Ureteral Drain/Stent Left ureter 6 Fr. 12/29/16  1400  Left ureter  2036            Intake/Output Last 24 hours  Intake/Output Summary (Last 24 hours) at  07/27/2022 2319 Last data filed at 07/27/2022 2025 Gross per 24 hour  Intake 800 ml  Output --  Net 800 ml    Labs/Imaging Results for orders placed or performed during the hospital encounter of 07/27/22 (from the past 48 hour(s))  Urinalysis, w/ Reflex to Culture (Infection Suspected) -Urine, Clean Catch     Status: Abnormal   Collection Time: 07/27/22  3:12 PM  Result Value Ref Range   Specimen Source URINE, CLEAN CATCH    Color, Urine STRAW (A) YELLOW   APPearance CLEAR CLEAR   Specific Gravity, Urine 1.008 1.005 - 1.030   pH 5.0 5.0 - 8.0   Glucose, UA >=500 (A) NEGATIVE mg/dL   Hgb urine dipstick NEGATIVE NEGATIVE   Bilirubin Urine NEGATIVE NEGATIVE   Ketones, ur NEGATIVE NEGATIVE mg/dL   Protein, ur NEGATIVE NEGATIVE mg/dL   Nitrite NEGATIVE NEGATIVE   Leukocytes,Ua NEGATIVE NEGATIVE   RBC / HPF 0-5 0 - 5 RBC/hpf   WBC, UA 0-5 0 - 5 WBC/hpf    Comment:        Reflex urine culture not performed if WBC <=10, OR if Squamous epithelial cells >5. If Squamous epithelial cells >5 suggest recollection.    Bacteria, UA RARE (A) NONE SEEN   Squamous Epithelial / HPF 0-5 0 - 5 /HPF   Mucus PRESENT     Comment: Performed at [facility], [address] [place], [place] [address]  SARS Coronavirus 2 by RT PCR (hospital order, performed in [facility] hospital lab) *cepheid single result test* Urine, Clean Catch     Status: None   Collection Time: 07/27/22  3:12 PM   Specimen: Urine, Clean Catch; Nasal Swab  Result Value Ref Range   SARS Coronavirus 2 by RT PCR NEGATIVE NEGATIVE    Comment: Performed at [facility], [address] [place], [place] [address]  Brain natriuretic peptide     Status: Abnormal   Collection Time: 07/27/22  3:44 PM  Result Value Ref Range   B Natriuretic Peptide 224.8 (H) 0.0 - 100.0 pg/mL    Comment: Performed at [facility], [address], [place], [place] [address]  Troponin I (High Sensitivity)     Status: None   Collection  Time: 07/27/22  3:45 PM  Result Value Ref Range   Troponin I (High Sensitivity) 11 <18 ng/L    Comment: (NOTE) Elevated high sensitivity troponin I (hsTnI) values and significant  changes across serial measurements may suggest ACS but many other  chronic and acute conditions are known to elevate hsTnI results.  Refer to the "Links" section for chest pain algorithms and additional  guidance. Performed  at [facility], [address], [place], [place] [address]   Lactic acid, plasma     Status: Abnormal   Collection Time: 07/27/22  3:45 PM  Result Value Ref Range   Lactic Acid, Venous 2.3 (HH) 0.5 - 1.9 mmol/L    Comment: CRITICAL RESULT CALLED TO, READ BACK BY AND VERIFIED WITH Jaci Lazier, RN AT 1733 05.07.24 D. BLU Performed at [facility], [address] [place], [place] [address]   Comprehensive metabolic panel     Status: Abnormal   Collection Time: 07/27/22  3:45 PM  Result Value Ref Range   Sodium 140 135 - 145 mmol/L   Potassium 3.9 3.5 - 5.1 mmol/L   Chloride 102 98 - 111 mmol/L   CO2 25 22 - 32 mmol/L   Glucose, Bld 186 (H) 70 - 99 mg/dL    Comment: Glucose reference range applies only to samples taken after fasting for at least 8 hours.   BUN 20 8 - 23 mg/dL   Creatinine, Ser 0.98 (H) 0.44 - 1.00 mg/dL   Calcium 9.2 8.9 - 11.9 mg/dL   Total Protein 7.9 6.5 - 8.1 g/dL   Albumin 3.8 3.5 - 5.0 g/dL   AST 22 15 - 41 U/L   ALT 25 0 - 44 U/L   Alkaline Phosphatase 53 38 - 126 U/L   Total Bilirubin 0.9 0.3 - 1.2 mg/dL   GFR, Estimated 41 (L) >60 mL/min    Comment: (NOTE) Calculated using the CKD-EPI Creatinine Equation (2021)    Anion gap 13 5 - 15    Comment: Performed at [facility], [address] [place], [place] [address]  CBC with Differential     Status: Abnormal   Collection Time: 07/27/22  3:45 PM  Result Value Ref Range   WBC 19.4 (H) 4.0 - 10.5 K/uL   RBC 4.84 3.87 - 5.11 MIL/uL   Hemoglobin 15.1 (H) 12.0 - 15.0 g/dL   HCT 95.6 (H)  21.3 - 46.0 %   MCV 99.4 80.0 - 100.0 fL   MCH 31.2 26.0 - 34.0 pg   MCHC 31.4 30.0 - 36.0 g/dL   RDW 08.6 57.8 - 46.9 %   Platelets 229 150 - 400 K/uL   nRBC 0.0 0.0 - 0.2 %   Neutrophils Relative % 85 %   Neutro Abs 16.7 (H) 1.7 - 7.7 K/uL   Lymphocytes Relative 5 %   Lymphs Abs 1.0 0.7 - 4.0 K/uL   Monocytes Relative 8 %   Monocytes Absolute 1.5 (H) 0.1 - 1.0 K/uL   Eosinophils Relative 1 %   Eosinophils Absolute 0.1 0.0 - 0.5 K/uL   Basophils Relative 0 %   Basophils Absolute 0.0 0.0 - 0.1 K/uL   Immature Granulocytes 1 %   Abs Immature Granulocytes 0.09 (H) 0.00 - 0.07 K/uL    Comment: Performed at [facility], [address], [place], [place] [address]  I-Stat venous blood gas, ED     Status: Abnormal   Collection Time: 07/27/22  3:51 PM  Result Value Ref Range   pH, Ven 7.383 7.25 - 7.43   pCO2, Ven 47.4 44 - 60 mmHg   pO2, Ven 17 (LL) 32 - 45 mmHg   Bicarbonate 28.2 (H) 20.0 - 28.0 mmol/L   TCO2 30 22 - 32 mmol/L   O2 Saturation 24 %   Acid-Base Excess 2.0 0.0 - 2.0 mmol/L   Sodium 141 135 - 145  mmol/L   Potassium 4.0 3.5 - 5.1 mmol/L   Calcium, Ion 1.12 (L) 1.15 - 1.40 mmol/L   HCT 47.0 (H) 36.0 - 46.0 %   Hemoglobin 16.0 (H) 12.0 - 15.0 g/dL   Sample type VENOUS    Comment NOTIFIED PHYSICIAN   I-stat chem 8, ED     Status: Abnormal   Collection Time: 07/27/22  3:51 PM  Result Value Ref Range   Sodium 142 135 - 145 mmol/L   Potassium 4.0 3.5 - 5.1 mmol/L   Chloride 106 98 - 111 mmol/L   BUN 25 (H) 8 - 23 mg/dL   Creatinine, Ser 4.78 (H) 0.44 - 1.00 mg/dL   Glucose, Bld 295 (H) 70 - 99 mg/dL    Comment: Glucose reference range applies only to samples taken after fasting for at least 8 hours.   Calcium, Ion 1.12 (L) 1.15 - 1.40 mmol/L   TCO2 26 22 - 32 mmol/L   Hemoglobin 16.3 (H) 12.0 - 15.0 g/dL   HCT 62.1 (H) 30.8 - 65.7 %  Lactic acid, plasma     Status: Abnormal   Collection Time: 07/27/22  8:25 PM  Result Value Ref Range   Lactic Acid, Venous  2.5 (HH) 0.5 - 1.9 mmol/L    Comment: CRITICAL RESULT CALLED TO, READ BACK BY AND VERIFIED WITH Anner Crete, RN. [phone] 07/27/22. LPAIT Performed at [facility], [address] [place], [place] [address]   Troponin I (High Sensitivity)     Status: None   Collection Time: 07/27/22  8:25 PM  Result Value Ref Range   Troponin I (High Sensitivity) 12 <18 ng/L    Comment: (NOTE) Elevated high sensitivity troponin I (hsTnI) values and significant  changes across serial measurements may suggest ACS but many other  chronic and acute conditions are known to elevate hsTnI results.  Refer to the "Links" section for chest pain algorithms and additional  guidance. Performed at [facility], [address], [place], [place] [address]    CT RENAL STONE STUDY  Result Date: 07/27/2022 CLINICAL DATA:  Abdominal/flank pain.  Stones suspected. EXAM: CT ABDOMEN AND PELVIS WITHOUT CONTRAST TECHNIQUE: Multidetector CT imaging of the abdomen and pelvis was performed following the standard protocol without IV contrast. RADIATION DOSE REDUCTION: This exam was performed according to the departmental dose-optimization program which includes automated exposure control, adjustment of the mA and/or kV according to patient size and/or use of iterative reconstruction technique. COMPARISON:  CT abdomen and pelvis 12/13/2016 FINDINGS: Lower chest: Bibasilar atelectasis/scarring.  No acute abnormality. Hepatobiliary: Hepatic steatosis. Unremarkable gallbladder and biliary tree. Pancreas: Unremarkable. Spleen: Unremarkable. Adrenals/Urinary Tract: Stable adrenal glands. Contrast from prior CTA within the renal collecting systems and bladder. Cortical renal scarring bilaterally. No obstructing stone. No hydronephrosis. Unremarkable bladder. Stomach/Bowel: Normal caliber large and small bowel. Colonic diverticulosis. Hepatic steatosis. There is mild stranding about the descending colon which is decompressed. Unremarkable  appendix. Stomach is within normal limits. Small hiatal hernia. Vascular/Lymphatic: Advanced aortic atherosclerotic calcification. No lymphadenopathy. Reproductive: Hysterectomy. Other: No free intraperitoneal fluid or air. Musculoskeletal: No acute fracture.  Thoracolumbar spondylosis. IMPRESSION: Possible mild descending colon diverticulitis/colitis Electronically Signed   By: Minerva Fester M.D.   On: 07/27/2022 22:11   CT Angio Chest PE W and/or Wo Contrast  Result Date: 07/27/2022 CLINICAL DATA:  Dyspnea/hypoxia PE suspected EXAM: CT ANGIOGRAPHY CHEST WITH CONTRAST TECHNIQUE: Multidetector CT imaging of the chest was performed using the standard protocol during bolus administration of intravenous contrast.  Multiplanar CT image reconstructions and MIPs were obtained to evaluate the vascular anatomy. RADIATION DOSE REDUCTION: This exam was performed according to the departmental dose-optimization program which includes automated exposure control, adjustment of the mA and/or kV according to patient size and/or use of iterative reconstruction technique. CONTRAST:  60mL OMNIPAQUE IOHEXOL 350 MG/ML SOLN COMPARISON:  Chest radiographs 07/27/2022 and CTA chest 12/15/2016 FINDINGS: Cardiovascular: Satisfactory opacification of the pulmonary arteries to the segmental level. No pulmonary embolism. Coronary artery and aortic atherosclerotic calcification. Borderline cardiomegaly. No pericardial effusion. Mediastinum/Nodes: Pretracheal node measuring 1.1 cm subcentimeter subcarinal nodes. These are unchanged from 2018 and likely reactive. Small hiatal hernia. Lungs/Pleura: Respiratory motion obscures fine detail in the lung bases. Platelike consolidation in the lingula and left lower lobe. Mild pleural thickening in the posterior left lower lung. No pleural effusion. No pneumothorax. The central airways are patent. Upper Abdomen: No acute abnormality. Musculoskeletal: No acute fracture. Review of the MIP images confirms  the above findings. IMPRESSION: 1. No evidence of acute pulmonary embolism. 2. Platelike consolidation in the lingula and left lower lobe, favor atelectasis. 3. Small hiatal hernia. 4. Coronary artery calcification. Aortic Atherosclerosis (ICD10-I70.0). Electronically Signed   By: Minerva Fester M.D.   On: 07/27/2022 20:10   DG Chest Port 1 View  Result Date: 07/27/2022 CLINICAL DATA:  Shortness of breath, chest pain EXAM: PORTABLE CHEST 1 VIEW COMPARISON:  03/18/2022 FINDINGS: Cardiomegaly. Heterogeneous opacity of the peripheral left lung base. The visualized skeletal structures are unremarkable. IMPRESSION: 1. Cardiomegaly. 2. Heterogeneous opacity of the peripheral left lung base, concerning for infection or aspiration. Consider PA and lateral radiographs or CT to further evaluate. Electronically Signed   By: Jearld Lesch M.D.   On: 07/27/2022 15:52    Pending Labs Unresulted Labs (From admission, onward)     Start     Ordered   07/28/22 0500  CBC with Differential/Platelet  Tomorrow morning,   R        07/27/22 2238   07/28/22 0500  Basic metabolic panel  Tomorrow morning,   R        07/27/22 2238   07/27/22 2239  Lactic acid, plasma  STAT Now then every 3 hours,   R (with STAT occurrences)      07/27/22 2239   07/27/22 2234  Legionella Pneumophila Serogp 1 Ur Ag  Once,   R        07/27/22 2234   07/27/22 2234  Strep pneumoniae urinary antigen  Once,   R        07/27/22 2234   07/27/22 2156  Hemoglobin A1c  Once,   R       Comments: To assess prior glycemic control    07/27/22 2155   07/27/22 2153  C Difficile Quick Screen w PCR reflex  (C Difficile quick screen w PCR reflex panel )  Once, for 24 hours,   URGENT       References:    CDiff Information Tool   07/27/22 2152   07/27/22 2153  Gastrointestinal Panel by PCR , Stool  (Gastrointestinal Panel by PCR, Stool                                                                                                                                                      **  Does Not include CLOSTRIDIUM DIFFICILE testing. **If CDIFF testing is needed, place order from the "C Difficile Testing" order set.**)  Once,   URGENT        07/27/22 2152   07/27/22 1512  Culture, blood (routine x 2)  BLOOD CULTURE X 2,   R (with STAT occurrences)      07/27/22 1512            Vitals/Pain Today's Vitals   07/27/22 1715 07/27/22 1730 07/27/22 1832 07/27/22 2026  BP: (!) 155/82 (!) 165/72 (!) 153/107 (!) 148/99  Pulse: 93 (!) 104 98 (!) 114  Resp: (!) 33 (!) 30 (!) 33 (!) 32  Temp:    98.5 F (36.9 C)  TempSrc:    Oral  SpO2: 98% 99% 95% 95%  Weight:      Height:      PainSc:    6     Isolation Precautions Enteric precautions (UV disinfection)  Medications Medications  ondansetron (ZOFRAN) 4 MG/2ML injection (has no administration in time range)  ondansetron (ZOFRAN) injection 4 mg (has no administration in time range)  ondansetron (ZOFRAN) injection 4 mg (has no administration in time range)  sodium chloride 0.9 % bolus 1,000 mL (has no administration in time range)  insulin aspart (novoLOG) injection 0-15 Units (has no administration in time range)  insulin aspart (novoLOG) injection 0-5 Units (has no administration in time range)  metoprolol tartrate (LOPRESSOR) tablet 150 mg (has no administration in time range)  Rivaroxaban (XARELTO) tablet 15 mg (has no administration in time range)  traZODone (DESYREL) tablet 50 mg (has no administration in time range)  lactated ringers infusion (has no administration in time range)  melatonin tablet 5 mg (has no administration in time range)  piperacillin-tazobactam (ZOSYN) IVPB 3.375 g (has no administration in time range)  ipratropium (ATROVENT) nebulizer solution 0.5 mg (has no administration in time range)  sodium chloride 0.9 % bolus 500 mL (0 mLs Intravenous Stopped 07/27/22 2025)  vancomycin (VANCOREADY) IVPB 1500 mg/300 mL (1,500 mg Intravenous New Bag/Given 07/27/22 2024)  iohexol  (OMNIPAQUE) 350 MG/ML injection 60 mL (60 mLs Intravenous Contrast Given 07/27/22 1952)    Mobility walks     Focused Assessments Cardiac Assessment Handoff:  Cardiac Rhythm: Atrial fibrillation Lab Results  Component Value Date   CKTOTAL 34 06/21/2013   CKMB 1.2 06/21/2013   TROPONINI <0.03 03/12/2017   No results found for: "DDIMER" Does the Patient currently have chest pain? No   , Neuro Assessment Handoff:  Swallow screen pass? Yes  Cardiac Rhythm: Atrial fibrillation       Neuro Assessment:   Neuro Checks:      Has TPA been given? No If patient is a Neuro Trauma and patient is going to OR before floor call report to 4N Charge nurse: [phone] or [phone]  , Pulmonary Assessment Handoff:  Lung sounds:   O2 Device: Nasal Cannula O2 Flow Rate (L/min): 2 L/min    R Recommendations: See Admitting Provider Note  Report given to:   Additional Notes: n/a

## 2022-07-27 NOTE — Progress Notes (Signed)
Pharmacy Antibiotic Note  Holly Smith is a 77 y.o. female admitted on 07/27/2022 with sepsis.  Pharmacy has been consulted for vancomycin and cefepime dosing.  She is afebrile, normotensive, with an elevated white blood cell count of 19.4k. Her urine culture was positive for e coli sensitive to cefepime on 07/16/2022.    Plan: Give Vancomycin 1500mg  IV x 1 then start vancomycin 1000 mg IV q48 hours (eAUC 456, IBW, Vd 0.5, Scr 1.40)  Start cefepime 2g q12 hours  Follow up renal function, cultures, signs and symptoms of infection, and ability to narrow   Height: 5' (152.4 cm) Weight: 90.7 kg (200 lb) IBW/kg (Calculated) : 45.5  Temp (24hrs), Avg:98.4 F (36.9 C), Min:98.4 F (36.9 C), Max:98.4 F (36.9 C)  Recent Labs  Lab 07/27/22 1545 07/27/22 1551  WBC 19.4*  --   CREATININE 1.32* 1.40*  LATICACIDVEN 2.3*  --     Estimated Creatinine Clearance: 33.3 mL/min (A) (by C-G formula based on SCr of 1.4 mg/dL (H)).    Allergies  Allergen Reactions   Ciprofloxacin Other (See Comments)    SOB     Microbiology results: 07/27/2022 BCx: sent  Thank you for allowing pharmacy to be a part of this patient's care.  Blane Ohara, PharmD  PGY1 Pharmacy Resident

## 2022-07-27 NOTE — ED Provider Notes (Signed)
[place] [facility]MENT AT [facility] Provider Note   CSN: [id number] Arrival date & time: 07/27/22  1447     History  Chief Complaint  Patient presents with   Shortness of Breath    From home via ems with c/o shortness of breath, chest pain described as tightness onset today. O2 initiated with fire rescue; pt denies CP at this time. Reports diarrhea x 3 days and 2 episodes of vomiting today. Patient on antibiotics for UTI; 2nd round abx started Sunday. Recent medication changes for diabetes.     Valoria Iraida Scanlin is a 77 y.o. female.  77 year old female with prior medical history as detailed below presents for evaluation.  Patient arrives with complaint of shortness of breath and chest discomfort.  Symptoms began early this morning.  Patient reports that her chest discomfort and her shortness of breath improved with administration of O2.  Patient was noted to be hypoxic at about 90% on initial evaluation by first responders.  Patient does report some intermittent loose diarrheal stool over the last 2 to 3 days.  Patient reports that she had 2 episodes of vomiting earlier today.  She denies any nausea now.  The history is provided by the patient and medical records.       Home Medications Prior to Admission medications   Medication Sig Start Date End Date Taking? Authorizing Provider  BIOTIN PO Take 1 tablet by mouth daily.     [provider]  cephALEXin (KEFLEX) 500 MG capsule Take 1 capsule (500 mg total) by mouth 3 (three) times daily for 7 days. 07/16/22 07/23/22  [name], DO  Cholecalciferol (VITAMIN D3) 1000 units CAPS Take by mouth.    [provider]  diclofenac Sodium (VOLTAREN) 1 % GEL Apply topically. 12/19/20   [provider]  Dulaglutide (TRULICITY) 0.75 MG/0.5ML SOPN Inject 0.75 mg into the skin once a week for 28 days. 07/16/22 08/13/22  [name], DO  Dulaglutide (TRULICITY) 1.5 MG/0.5ML  SOPN Inject 1.5 mg into the skin once a week. 09/10/22   Wendling, [name], DO  FARXIGA 10 MG TABS tablet Take 10 mg by mouth daily. 12/25/20   [provider]  fluticasone (FLONASE) 50 MCG/ACT nasal spray Place 2 sprays into both nostrils daily. 12/25/19   Daphine Deutscher, Mary-Margaret, FNP  irbesartan (AVAPRO) 75 MG tablet TAKE 1 TABLET BY MOUTH EVERY DAY 12/03/19   Corky Crafts, MD  metoprolol tartrate (LOPRESSOR) 100 MG tablet TAKE 1 & 1/2 TABS (150MG ) BY MOUTH 2 (TWO) TIMES DAILY. 04/27/22   Corky Crafts, MD  nitroGLYCERIN (NITROSTAT) 0.4 MG SL tablet Place 1 tablet (0.4 mg total) under the tongue every 5 (five) minutes as needed. 01/18/19   Arty Baumgartner, NP  [facility] VERIO test strip 1 each 3 (three) times daily. 02/26/21   [provider]  pantoprazole (PROTONIX) 40 MG tablet Take 1 tablet (40 mg total) by mouth daily. 03/16/22   Corky Crafts, MD  Rivaroxaban (XARELTO) 15 MG TABS tablet Take 1 tablet (15 mg total) by mouth daily with supper. 10/26/21   Corky Crafts, MD  torsemide (DEMADEX) 20 MG tablet Take 2 tablets (40 mg) PO daily 06/09/22   Corky Crafts, MD  TOVIAZ 4 MG TB24 tablet Take 4 mg by mouth daily. 02/19/21   [provider]  traZODone (DESYREL) 50 MG tablet Take 1 tablet (50 mg total) by mouth at bedtime as needed for sleep. 05/25/22   Wendling,  [name], DO  vitamin B-12 (CYANOCOBALAMIN) 1000 MCG tablet Take 1,000 mcg by mouth every other day.     [provider]  potassium chloride SA (K-DUR,KLOR-CON) 20 MEQ tablet Take 20 mEq by mouth daily.  07/30/19  [provider]      Allergies    Ciprofloxacin    Review of Systems   Review of Systems  All other systems reviewed and are negative.   Physical Exam Updated Vital Signs BP (!) 153/107   Pulse 98   Temp 98.4 F (36.9 C) (Oral)   Resp (!) 33   Ht 5' (1.524 m)   Wt 90.7 kg   SpO2 95%   BMI 39.06 kg/m  Physical Exam Vitals and  nursing note reviewed.  Constitutional:      General: She is not in acute distress.    Appearance: Normal appearance. She is well-developed.  HENT:     Head: Normocephalic and atraumatic.  Eyes:     Conjunctiva/sclera: Conjunctivae normal.     Pupils: Pupils are equal, round, and reactive to light.  Cardiovascular:     Rate and Rhythm: Normal rate. Rhythm irregular.     Heart sounds: Normal heart sounds.  Pulmonary:     Effort: Pulmonary effort is normal. No respiratory distress.     Comments: Decreased BS at bases Abdominal:     General: There is no distension.     Palpations: Abdomen is soft.     Tenderness: There is no abdominal tenderness.  Musculoskeletal:        General: No deformity. Normal range of motion.     Cervical back: Normal range of motion and neck supple.  Skin:    General: Skin is warm and dry.  Neurological:     General: No focal deficit present.     Mental Status: She is alert and oriented to person, place, and time.     ED Results / Procedures / Treatments   Labs (all labs ordered are listed, but only abnormal results are displayed) Labs Reviewed  LACTIC ACID, PLASMA - Abnormal; Notable for the following components:      Result Value   Lactic Acid, Venous 2.3 (*)    All other components within normal limits  COMPREHENSIVE METABOLIC PANEL - Abnormal; Notable for the following components:   Glucose, Bld 186 (*)    Creatinine, Ser 1.32 (*)    GFR, Estimated 41 (*)    All other components within normal limits  URINALYSIS, W/ REFLEX TO CULTURE (INFECTION SUSPECTED) - Abnormal; Notable for the following components:   Color, Urine STRAW (*)    Glucose, UA >=500 (*)    Bacteria, UA RARE (*)    All other components within normal limits  CBC WITH DIFFERENTIAL/PLATELET - Abnormal; Notable for the following components:   WBC 19.4 (*)    Hemoglobin 15.1 (*)    HCT 48.1 (*)    Neutro Abs 16.7 (*)    Monocytes Absolute 1.5 (*)    Abs Immature Granulocytes  0.09 (*)    All other components within normal limits  BRAIN NATRIURETIC PEPTIDE - Abnormal; Notable for the following components:   B Natriuretic Peptide 224.8 (*)    All other components within normal limits  I-STAT VENOUS BLOOD GAS, ED - Abnormal; Notable for the following components:   pO2, Ven 17 (*)    Bicarbonate 28.2 (*)    Calcium, Ion 1.12 (*)    HCT 47.0 (*)    Hemoglobin 16.0 (*)  All other components within normal limits  I-STAT CHEM 8, ED - Abnormal; Notable for the following components:   BUN 25 (*)    Creatinine, Ser 1.40 (*)    Glucose, Bld 186 (*)    Calcium, Ion 1.12 (*)    Hemoglobin 16.3 (*)    HCT 48.0 (*)    All other components within normal limits  SARS CORONAVIRUS 2 BY RT PCR  CULTURE, BLOOD (ROUTINE X 2)  CULTURE, BLOOD (ROUTINE X 2)  LACTIC ACID, PLASMA  TROPONIN I (HIGH SENSITIVITY)  TROPONIN I (HIGH SENSITIVITY)    EKG EKG Interpretation  Date/Time:  Tuesday Jul 27 2022 16:03:49 EDT Ventricular Rate:  98 PR Interval:    QRS Duration: 101 QT Interval:  347 QTC Calculation: 443 R Axis:   6 Text Interpretation: Atrial fibrillation Ventricular premature complex RSR' in V1 or V2, right VCD or RVH Borderline T abnormalities, diffuse leads Confirmed by Kristine Royal [phone]) on 07/27/2022 4:07:40 PM  Radiology CT Angio Chest PE W and/or Wo Contrast  Result Date: 07/27/2022 CLINICAL DATA:  Dyspnea/hypoxia PE suspected EXAM: CT ANGIOGRAPHY CHEST WITH CONTRAST TECHNIQUE: Multidetector CT imaging of the chest was performed using the standard protocol during bolus administration of intravenous contrast. Multiplanar CT image reconstructions and MIPs were obtained to evaluate the vascular anatomy. RADIATION DOSE REDUCTION: This exam was performed according to the departmental dose-optimization program which includes automated exposure control, adjustment of the mA and/or kV according to patient size and/or use of iterative reconstruction technique. CONTRAST:   60mL OMNIPAQUE IOHEXOL 350 MG/ML SOLN COMPARISON:  Chest radiographs 07/27/2022 and CTA chest 12/15/2016 FINDINGS: Cardiovascular: Satisfactory opacification of the pulmonary arteries to the segmental level. No pulmonary embolism. Coronary artery and aortic atherosclerotic calcification. Borderline cardiomegaly. No pericardial effusion. Mediastinum/Nodes: Pretracheal node measuring 1.1 cm subcentimeter subcarinal nodes. These are unchanged from 2018 and likely reactive. Small hiatal hernia. Lungs/Pleura: Respiratory motion obscures fine detail in the lung bases. Platelike consolidation in the lingula and left lower lobe. Mild pleural thickening in the posterior left lower lung. No pleural effusion. No pneumothorax. The central airways are patent. Upper Abdomen: No acute abnormality. Musculoskeletal: No acute fracture. Review of the MIP images confirms the above findings. IMPRESSION: 1. No evidence of acute pulmonary embolism. 2. Platelike consolidation in the lingula and left lower lobe, favor atelectasis. 3. Small hiatal hernia. 4. Coronary artery calcification. Aortic Atherosclerosis (ICD10-I70.0). Electronically Signed   By: Minerva Fester M.D.   On: 07/27/2022 20:10   DG Chest Port 1 View  Result Date: 07/27/2022 CLINICAL DATA:  Shortness of breath, chest pain EXAM: PORTABLE CHEST 1 VIEW COMPARISON:  03/18/2022 FINDINGS: Cardiomegaly. Heterogeneous opacity of the peripheral left lung base. The visualized skeletal structures are unremarkable. IMPRESSION: 1. Cardiomegaly. 2. Heterogeneous opacity of the peripheral left lung base, concerning for infection or aspiration. Consider PA and lateral radiographs or CT to further evaluate. Electronically Signed   By: Jearld Lesch M.D.   On: 07/27/2022 15:52    Procedures Procedures    Medications Ordered in ED Medications  vancomycin (VANCOREADY) IVPB 1500 mg/300 mL (has no administration in time range)  ceFEPIme (MAXIPIME) 2 g in sodium chloride 0.9 % 100 mL  IVPB (2 g Intravenous New Bag/Given 07/27/22 1826)  vancomycin (VANCOCIN) IVPB 1000 mg/200 mL premix (has no administration in time range)  sodium chloride 0.9 % bolus 500 mL (500 mLs Intravenous New Bag/Given 07/27/22 1825)  iohexol (OMNIPAQUE) 350 MG/ML injection 60 mL (60 mLs Intravenous Contrast Given 07/27/22 1952)  ED Course/ Medical Decision Making/ A&P                             Medical Decision Making Amount and/or Complexity of Data Reviewed Labs: ordered. Radiology: ordered.  Risk Prescription drug management. Decision regarding hospitalization.    Medical Screen Complete  This patient presented to the ED with complaint of shortness of breath, chest discomfort, hypoxia.  This complaint involves an extensive number of treatment options. The initial differential diagnosis includes, but is not limited to, pulmonary infection, pneumonia, CHF exacerbation, metabolic abnormality, bacterial versus viral infection, etc.  This presentation is: Acute, Chronic, Self-Limited, Previously Undiagnosed, Uncertain Prognosis, Complicated, Systemic Symptoms, and Threat to Life/Bodily Function  Patient presents with complaint of shortness of breath and chest discomfort.  Onset of symptoms was today.  Patient was noted to be mildly hypoxic.  With supplemental O2 at 2 L symptoms improved significantly.  Patient also complained of recent diarrhea and 2 episodes of vomiting earlier today.  Patient with recent reported UTI.  Initial presentation is most concerning for possible pneumonia.  Patient with elevated white count and elevated lactic acid.  Broad-spectrum antibiotics administered for possible sepsis and/or significant infection.  Patient is chronically anticoagulated with Xarelto.  However, given patient's symptoms CT angio to exclude PE warranted.  CT without evidence of PE.  Given patient's presentation she would benefit from admission for further workup and treatment.   Hospitalist service made aware of case and evaluate for admission. Additional history obtained:  External records from outside sources obtained and reviewed including prior ED visits and prior Inpatient records.    Lab Tests:  I ordered and personally interpreted labs.  The pertinent results include: CBC, CMP, troponin, BNP, lactic acid, i-STAT Chem-8, UA   Imaging Studies ordered:  I ordered imaging studies including chest x-ray, CT angio chest I independently visualized and interpreted obtained imaging which showed no PE I agree with the radiologist interpretation.   Cardiac Monitoring:  The patient was maintained on a cardiac monitor.  I personally viewed and interpreted the cardiac monitor which showed an underlying rhythm of: A-fib   Medicines ordered:  I ordered medication including broad-spectrum antibiotics for suspected infection Reevaluation of the patient after these medicines showed that the patient: improved  Problem List / ED Course:  Shortness of breath, hypoxia, leukocytosis, elevated lactic acid   Reevaluation:  After the interventions noted above, I reevaluated the patient and found that they have: improved  Disposition:  After consideration of the diagnostic results and the patients response to treatment, I feel that the patent would benefit from admission.   CRITICAL CARE Performed by: Wynetta Fines   Total critical care time: 30 minutes  Critical care time was exclusive of separately billable procedures and treating other patients.  Critical care was necessary to treat or prevent imminent or life-threatening deterioration.  Critical care was time spent personally by me on the following activities: development of treatment plan with patient and/or surrogate as well as nursing, discussions with consultants, evaluation of patient's response to treatment, examination of patient, obtaining history from patient or surrogate, ordering and performing  treatments and interventions, ordering and review of laboratory studies, ordering and review of radiographic studies, pulse oximetry and re-evaluation of patient's condition.          Final Clinical Impression(s) / ED Diagnoses Final diagnoses:  Dyspnea, unspecified type    Rx / DC Orders ED Discharge Orders  None         Wynetta Fines, MD 07/28/22 0000

## 2022-07-28 ENCOUNTER — Telehealth: Payer: Self-pay | Admitting: Family Medicine

## 2022-07-28 ENCOUNTER — Telehealth: Payer: Self-pay | Admitting: Neurology

## 2022-07-28 DIAGNOSIS — J9601 Acute respiratory failure with hypoxia: Secondary | ICD-10-CM | POA: Diagnosis not present

## 2022-07-28 LAB — CBC WITH DIFFERENTIAL/PLATELET
Abs Immature Granulocytes: 0.06 10*3/uL (ref 0.00–0.07)
Basophils Absolute: 0 10*3/uL (ref 0.0–0.1)
Basophils Relative: 0 %
Eosinophils Absolute: 0.3 10*3/uL (ref 0.0–0.5)
Eosinophils Relative: 2 %
HCT: 39.7 % (ref 36.0–46.0)
Hemoglobin: 13 g/dL (ref 12.0–15.0)
Immature Granulocytes: 0 %
Lymphocytes Relative: 8 %
Lymphs Abs: 1.3 10*3/uL (ref 0.7–4.0)
MCH: 31.5 pg (ref 26.0–34.0)
MCHC: 32.7 g/dL (ref 30.0–36.0)
MCV: 96.1 fL (ref 80.0–100.0)
Monocytes Absolute: 1.3 10*3/uL — ABNORMAL HIGH (ref 0.1–1.0)
Monocytes Relative: 8 %
Neutro Abs: 13.8 10*3/uL — ABNORMAL HIGH (ref 1.7–7.7)
Neutrophils Relative %: 82 %
Platelets: 194 10*3/uL (ref 150–400)
RBC: 4.13 MIL/uL (ref 3.87–5.11)
RDW: 13.4 % (ref 11.5–15.5)
WBC: 16.8 10*3/uL — ABNORMAL HIGH (ref 4.0–10.5)
nRBC: 0 % (ref 0.0–0.2)

## 2022-07-28 LAB — BASIC METABOLIC PANEL
Anion gap: 9 (ref 5–15)
BUN: 16 mg/dL (ref 8–23)
CO2: 25 mmol/L (ref 22–32)
Calcium: 8.4 mg/dL — ABNORMAL LOW (ref 8.9–10.3)
Chloride: 103 mmol/L (ref 98–111)
Creatinine, Ser: 1.22 mg/dL — ABNORMAL HIGH (ref 0.44–1.00)
GFR, Estimated: 45 mL/min — ABNORMAL LOW (ref >60)
Glucose, Bld: 150 mg/dL — ABNORMAL HIGH (ref 70–99)
Potassium: 3.6 mmol/L (ref 3.5–5.1)
Sodium: 137 mmol/L (ref 135–145)

## 2022-07-28 LAB — GLUCOSE, CAPILLARY
Glucose-Capillary: 135 mg/dL — ABNORMAL HIGH (ref 70–99)
Glucose-Capillary: 190 mg/dL — ABNORMAL HIGH (ref 70–99)
Glucose-Capillary: 207 mg/dL — ABNORMAL HIGH (ref 70–99)

## 2022-07-28 LAB — HEMOGLOBIN A1C
Hgb A1c MFr Bld: 7.8 % — ABNORMAL HIGH (ref 4.8–5.6)
Mean Plasma Glucose: 177.16 mg/dL

## 2022-07-28 LAB — LACTIC ACID, PLASMA
Lactic Acid, Venous: 1.8 mmol/L (ref 0.5–1.9)
Lactic Acid, Venous: 1.5 mmol/L (ref 0.5–1.9)

## 2022-07-28 LAB — PROCALCITONIN: Procalcitonin: 0.58 ng/mL

## 2022-07-28 LAB — CULTURE, BLOOD (ROUTINE X 2): Culture: NO GROWTH

## 2022-07-28 LAB — CBG MONITORING, ED: Glucose-Capillary: 169 mg/dL — ABNORMAL HIGH (ref 70–99)

## 2022-07-28 MED ORDER — LACTATED RINGERS IV BOLUS
500.0000 mL | Freq: Once | INTRAVENOUS | Status: AC
  Administered 2022-07-28: 500 mL via INTRAVENOUS

## 2022-07-28 MED ORDER — OXYCODONE HCL 5 MG PO TABS
5.0000 mg | ORAL_TABLET | Freq: Four times a day (QID) | ORAL | Status: DC | PRN
  Administered 2022-07-28 – 2022-07-29 (×3): 5 mg via ORAL
  Filled 2022-07-28 (×3): qty 1

## 2022-07-28 MED ORDER — LACTATED RINGERS IV SOLN: INTRAVENOUS | Status: DC

## 2022-07-28 MED ORDER — TRAMADOL HCL 50 MG PO TABS: 50.0000 mg | ORAL_TABLET | Freq: Four times a day (QID) | ORAL | Status: DC | PRN

## 2022-07-28 MED ORDER — METHOCARBAMOL 500 MG PO TABS
500.0000 mg | ORAL_TABLET | Freq: Four times a day (QID) | ORAL | Status: DC | PRN
  Administered 2022-07-28 – 2022-07-29 (×3): 500 mg via ORAL
  Filled 2022-07-28 (×3): qty 1

## 2022-07-28 MED ORDER — VITAMIN B-12 1000 MCG PO TABS
1000.0000 ug | ORAL_TABLET | ORAL | Status: DC
  Administered 2022-07-28 – 2022-07-30 (×2): 1000 ug via ORAL
  Filled 2022-07-28 (×2): qty 1

## 2022-07-28 MED ORDER — ONDANSETRON HCL 4 MG/2ML IJ SOLN: 4.0000 mg | Freq: Four times a day (QID) | INTRAMUSCULAR | Status: DC | PRN

## 2022-07-28 MED ORDER — MECLIZINE HCL 12.5 MG PO TABS: 12.5000 mg | ORAL_TABLET | Freq: Three times a day (TID) | ORAL | Status: DC | PRN

## 2022-07-28 NOTE — Progress Notes (Signed)
Holly Smith  UJW:[id number] DOB: 1945-08-28 DOA: 07/27/2022 PCP: [name], DO    Brief Narrative:  77 year old with a history of permanent atrial fibrillation, CAD, chronic systolic CHF, COPD, HTN, HLD, DM2, IBS with chronic diarrhea, and staghorn renal calculi who was seen by her PCP 2 weeks prior to this presentation with burning urination and bilateral flank pain and diagnosed with UTI.  She was initially prescribed Keflex which was then changed to nitrofurantoin due to culture data.  She presented to the ER 07/27/2022 with failure to improve as evidenced by persistent nausea vomiting and diarrhea.  Of note she had resumed Trulicity 07/17/2022 after previously stopping it for persisting nausea vomiting and diarrhea.  In the ER CTa of the chest was negative for PE but did reveal consolidation in the lingula/left lower lobe.  UA noted only rare bacteria.  CT renal stone noted mild descending: Diverticulitis/colitis.  Consultants:  None  Goals of Care:  Code Status: Full Code   DVT prophylaxis: Xarelto  Interim Hx: Afebrile since admission.  Vital signs stable.  The patient admits to some shortness of breath intermittently and a nagging cough.  She tells me that she is afraid as this often brings on the diarrhea.  She admits that her nausea vomiting and diarrhea are just like she had with her previous attempt to use Trulicity.  Assessment & Plan:  Left lower lobe pneumonia versus atelectasis  Continue antibiotics and monitor clinically - suspect this represents an aspiration event    Gastroenteritis - persistent N/V/D May simply be due to Trulicity, which has been stopped - metformin also a poor choice in someone having GI sx - monitor - low suspicion for infectious diarrhea   Recent UTI versus pyelonephritis Patient reports ongoing low back and flank pain -original culture data 07/16/2022 notes E. Coli - UA this admission unimpressive - suspect pain due to pulled  muscles related to frequent wretching/vomiting   Chronic persistent atrial fibrillation Continue usual metoprolol and Xarelto  CAD Continue usual beta-blocker, as needed nitro, and Farxiga   Chronic systolic CHF Holding diuretic for now as patient appears dehydrated -continue Lopressor and Farxiga  COPD Well compensated presently   DM2 A1c 7.8 - monitor trend off Trulicity - will need alternate agent for control at home   Family Communication: Son at bedside Disposition: From home -Antistin patient return home when stabilized  Objective: Blood pressure 105/61, pulse 84, temperature 97.6 F (36.4 C), resp. rate 18, height 5' (1.524 m), weight 90.7 kg, SpO2 95 %.  Intake/Output Summary (Last 24 hours) at 07/28/2022 0857 Last data filed at 07/28/2022 0027 Gross per 24 hour  Intake 1100.22 ml  Output --  Net 1100.22 ml   Filed Weights   07/27/22 1518  Weight: 90.7 kg    Examination: General: No acute respiratory distress Lungs: Clear to auscultation bilaterally without wheezes or crackles Cardiovascular: Regular rate and rhythm without murmur gallop or rub normal S1 and S2 Abdomen: Nontender, nondistended, soft, bowel sounds positive, no rebound, no ascites, no appreciable mass Extremities: No significant cyanosis, clubbing, or edema bilateral lower extremities  CBC: Recent Labs  Lab 07/27/22 1545 07/27/22 1551 07/28/22 0609  WBC 19.4*  --  16.8*  NEUTROABS 16.7*  --  13.8*  HGB 15.1* 16.3*  16.0* 13.0  HCT 48.1* 48.0*  47.0* 39.7  MCV 99.4  --  96.1  PLT 229  --  194   Basic Metabolic Panel: Recent Labs  Lab 07/27/22 1545 07/27/22 1551  07/28/22 0609  NA 140 142  141 137  K 3.9 4.0  4.0 3.6  CL 102 106 103  CO2 25  --  25  GLUCOSE 186* 186* 150*  BUN 20 25* 16  CREATININE 1.32* 1.40* 1.22*  CALCIUM 9.2  --  8.4*   GFR: Estimated Creatinine Clearance: 38.2 mL/min (A) (by C-G formula based on SCr of 1.22 mg/dL (H)).   Scheduled Meds:  insulin  aspart  0-15 Units Subcutaneous TID WC   insulin aspart  0-5 Units Subcutaneous QHS   melatonin  5 mg Oral QHS   metoprolol tartrate  150 mg Oral BID   Rivaroxaban  15 mg Oral Q supper   Continuous Infusions:  lactated ringers 75 mL/hr at 07/28/22 0226   piperacillin-tazobactam (ZOSYN)  IV 3.375 g (07/28/22 0527)     LOS: 1 day   Lonia Blood, MD [facility] Office  [phone] Pager - Text Page per Loretha Stapler  If 7PM-7AM, please contact night-coverage per Amion 07/28/2022, 8:57 AM

## 2022-07-28 NOTE — Telephone Encounter (Signed)
Can't see everything that the inpt team does. She is certainly not septic on paper right now. On antibiotics for possible lung infection. Diarrhea is why the sign is on the door. Need to have stool sample to run Cdiff testing. I can only report what I see in the chart.

## 2022-07-28 NOTE — Telephone Encounter (Signed)
Called pt daughter. She stated she has already talk [employer] and they are going to send someone to hospital to measure pt for a new mask.

## 2022-07-28 NOTE — Evaluation (Signed)
Physical Therapy Evaluation Patient Details Name: Holly Smith MRN: [id number] DOB: 10-27-1945 Today's Date: 07/28/2022  History of Present Illness  Pt is a 77 y/o F admitted on 07/27/22 after presenting with c/o N&V & diarrhea, as well as SOB. Pt is being treated for ARF, platelike atelectasis suspect PNA. Of note, pt was diagnosed with UTI 2 weeks ago by PCP. PMH: permanent a-fib, CAD, chronic systolic heart failure, COPD, HTN, HLD, DM2, IBS diarrheal type, staghorn calculus, OSA, vulvar CA  Clinical Impression  Pt seen for PT evaluation with pt agreeable to tx, son Marita [place]) present in room. Prior to admission pt was living with her son in mobile home with 4 steps with B rails to enter. Pt ambulated house hold distances without AD but had some sort of UE support when ambulating in the community, or used her scooter. Pt denies falls prior to admission.  On this date, pt requires min assist to complete supine>sit even with HOB elevated & use of bed rails, but was able to complete STS & step pivot with UE support & supervision. Pt presents with decreased activity tolerance, strength & balance. Recommend PT services to address strength, balance, & endurance training to increase independence & reduce fall risk.   Recommendations for follow up therapy are one component of a multi-disciplinary discharge planning process, led by the attending physician.  Recommendations may be updated based on patient status, additional functional criteria and insurance authorization.  Follow Up Recommendations       Assistance Recommended at Discharge Frequent or constant Supervision/Assistance  Patient can return home with the following  A little help with bathing/dressing/bathroom;A little help with walking and/or transfers;Assist for transportation;Assistance with cooking/housework;Help with stairs or ramp for entrance    Equipment Recommendations Rolling walker (2 wheels)  Recommendations for Other Services        Functional Status Assessment Patient has had a recent decline in their functional status and demonstrates the ability to make significant improvements in function in a reasonable and predictable amount of time.     Precautions / Restrictions Precautions Precautions: Fall Restrictions Weight Bearing Restrictions: No      Mobility  Bed Mobility Overal bed mobility: Needs Assistance Bed Mobility: Sit to Supine     Supine to sit: Min assist, HOB elevated     General bed mobility comments: use of bed rails, extra time to upright trunk to sitting    Transfers Overall transfer level: Needs assistance Equipment used: Rolling walker (2 wheels) Transfers: Sit to/from Stand, Bed to chair/wheelchair/BSC Sit to Stand: Supervision   Step pivot transfers: Supervision       General transfer comment: STS from EOB with poor awareness of safe hand placement despite PT educating her. Pt is able to complete step pivot bed>recliner without AD but pt holding to chair armrests.    Ambulation/Gait Ambulation/Gait assistance:  (pt declined 2/2 feeling unwell)                Stairs            Wheelchair Mobility    Modified Rankin (Stroke Patients Only)       Balance Overall balance assessment: Needs assistance Sitting-balance support: No upper extremity supported, Feet supported Sitting balance-Leahy Scale: Fair     Standing balance support: Bilateral upper extremity supported, During functional activity Standing balance-Leahy Scale: Fair  Pertinent Vitals/Pain Pain Assessment Pain Assessment: Faces Faces Pain Scale: Hurts little more Pain Location: lower abdominal discomfort Pain Descriptors / Indicators: Discomfort Pain Intervention(s): Monitored during session    Home Living Family/patient expects to be discharged to:: Private residence Living Arrangements: Children (adult son) Available Help at Discharge:  Family;Available PRN/intermittently (family works during the day) Type of Home: Mobile home Home Access: Stairs to enter Entrance Stairs-Rails: Right;Left;Can reach both Entrance Stairs-Number of Steps: 4   Home Layout: One level Home Equipment: Rollator (4 wheels);Cane - single point;Other (comment) (scooter, CPAP)      Prior Function Prior Level of Function : Independent/Modified Independent;Driving             Mobility Comments: Ambulates in the home without AD, uses SPC, holds to son's arm, or scooter for community mobility, denies falls. Hx of BPPV. ADLs Comments: independent ADLs, IADLs, med mgmt     Hand Dominance   Dominant Hand: Right    Extremity/Trunk Assessment   Upper Extremity Assessment Upper Extremity Assessment: Generalized weakness    Lower Extremity Assessment Lower Extremity Assessment: Generalized weakness       Communication   Communication: No difficulties  Cognition Arousal/Alertness: Awake/alert Behavior During Therapy: WFL for tasks assessed/performed Overall Cognitive Status: Within Functional Limits for tasks assessed                                 General Comments: Appears WFL, education/encouragement for OOB mobility.        General Comments General comments (skin integrity, edema, etc.): Pt on 2L/min via nasal cannula. Pt endorses dizziness after supine>sit but reports this improves. No nystagmus observed (pt reports hx of BPPV). Encouraged fluid intake & OOB mobility.    Exercises     Assessment/Plan    PT Assessment Patient needs continued PT services  PT Problem List Cardiopulmonary status limiting activity;Decreased strength;Decreased range of motion;Decreased activity tolerance;Decreased balance;Decreased mobility;Decreased safety awareness;Decreased knowledge of use of DME       PT Treatment Interventions DME instruction;Therapeutic exercise;Gait training;Balance training;Neuromuscular re-education;Stair  training;Functional mobility training;Therapeutic activities;Patient/family education    PT Goals (Current goals can be found in the Care Plan section)  Acute Rehab PT Goals Patient Stated Goal: get better, rest, go home PT Goal Formulation: With patient Time For Goal Achievement: 08/11/22 Potential to Achieve Goals: Good    Frequency Min 3X/week     Co-evaluation               AM-PAC PT "6 Clicks" Mobility  Outcome Measure Help needed turning from your back to your side while in a flat bed without using bedrails?: A Little Help needed moving from lying on your back to sitting on the side of a flat bed without using bedrails?: A Little Help needed moving to and from a bed to a chair (including a wheelchair)?: A Little Help needed standing up from a chair using your arms (e.g., wheelchair or bedside chair)?: A Little Help needed to walk in hospital room?: A Little Help needed climbing 3-5 steps with a railing? : A Lot 6 Click Score: 17    End of Session Equipment Utilized During Treatment: Oxygen Activity Tolerance: Patient limited by fatigue Patient left: in chair;with call bell/phone within reach;with family/visitor present Nurse Communication: Mobility status PT Visit Diagnosis: Muscle weakness (generalized) (M62.81);Difficulty in walking, not elsewhere classified (R26.2);Unsteadiness on feet (R26.81)    Time: 1202-1223 PT Time Calculation (min) (ACUTE ONLY):  21 min   Charges:   PT Evaluation $PT Eval Low Complexity: 1 Low          Aleda Grana, PT, DPT 07/28/22, 12:35 PM   Sandi Mariscal 07/28/2022, 12:33 PM

## 2022-07-28 NOTE — Evaluation (Addendum)
Occupational Therapy Evaluation Patient Details Name: Holly Smith MRN: [id number] DOB: April 03, 1945 Today's Date: 07/28/2022   History of Present Illness 77 yo female admitted 5/7 with SOB and acute respiratory failure. PMHx: Afib, CHf, COPD, HTN, vulvar CA, vertigo, CAD, HLD, DM, IBS, CVA   Clinical Impression   PTA patient independent and driving. She reports living with her son, who works during the day. Admitted for above and presents with problem list below. Today, completes bed mobility with min assist, transfers with min assist and ADLs with up to min assist. Requires increased time for activities, pt decreased activity tolerance, generalized weakness, impaired balance, pain and dizziness OOB. Pt on 2L with SPO2 94%, HR 90-100.  Based on performance today, believe she will best benefit from further OT services acutely and after dc at [facility] level (pending progress) to optimize independence, safety and return to PLOF.      Recommendations for follow up therapy are one component of a multi-disciplinary discharge planning process, led by the attending physician.  Recommendations may be updated based on patient status, additional functional criteria and insurance authorization.   Assistance Recommended at Discharge Intermittent Supervision/Assistance  Patient can return home with the following A little help with walking and/or transfers;A little help with bathing/dressing/bathroom;Assistance with cooking/housework;Assist for transportation;Help with stairs or ramp for entrance    Functional Status Assessment  Patient has had a recent decline in their functional status and demonstrates the ability to make significant improvements in function in a reasonable and predictable amount of time.  Equipment Recommendations  BSC/3in1    Recommendations for Other Services PT consult     Precautions / Restrictions Precautions Precautions: Fall Restrictions Weight Bearing Restrictions: No       Mobility Bed Mobility Overal bed mobility: Needs Assistance Bed Mobility: Sit to Supine, Supine to Sit     Supine to sit: Min assist Sit to supine: Min assist   General bed mobility comments: trunk support to ascend    Transfers Overall transfer level: Needs assistance Equipment used: 1 person hand held assist Transfers: Sit to/from Stand Sit to Stand: Min assist           General transfer comment: to power up and steady, given increased time      Balance Overall balance assessment: Needs assistance Sitting-balance support: No upper extremity supported, Feet supported Sitting balance-Leahy Scale: Fair     Standing balance support: Single extremity supported, During functional activity Standing balance-Leahy Scale: Fair Standing balance comment: relies on UE support                           ADL either performed or assessed with clinical judgement   ADL Overall ADL's : Needs assistance/impaired     Grooming: Set up;Sitting           Upper Body Dressing : Set up;Sitting   Lower Body Dressing: Minimal assistance;Sit to/from stand   Toilet Transfer: Minimal assistance Statistician Details (indicate cue type and reason): hand held assist, simulated side stepping to [facility]         Functional mobility during ADLs: Minimal assistance (HHA) General ADL Comments: pt limited by dizziness (but hx of vertigo), weakness, and pain     Vision   Vision Assessment?: No apparent visual deficits     Perception     Praxis      Pertinent Vitals/Pain Pain Assessment Pain Assessment: Faces Faces Pain Scale: Hurts even more Pain Location: abdomen, back  Pain Descriptors / Indicators: Other (Comment), Pressure (intermittent) Pain Intervention(s): Limited activity within patient's tolerance, Monitored during session, Repositioned     Hand Dominance Right   Extremity/Trunk Assessment Upper Extremity Assessment Upper Extremity Assessment: Generalized  weakness   Lower Extremity Assessment Lower Extremity Assessment: Defer to PT evaluation       Communication Communication Communication: No difficulties   Cognition Arousal/Alertness: Awake/alert Behavior During Therapy: WFL for tasks assessed/performed Overall Cognitive Status: Within Functional Limits for tasks assessed                                 General Comments: appears WFL     General Comments  SpO2 on 2L [place] 94%, HR 90-100; pt reports dizziness with positional changes but voices hx of vertigo    Exercises     Shoulder Instructions      Home Living Family/patient expects to be discharged to:: Private residence Living Arrangements: Children (son) Available Help at Discharge: Family;Available PRN/intermittently Type of Home: Mobile home Home Access: Stairs to enter Entrance Stairs-Number of Steps: 4 Entrance Stairs-Rails: Can reach both Home Layout: One level     Bathroom Shower/Tub: Producer, television/film/video: Handicapped height     Home Equipment: Rollator (4 wheels);Cane - single point;Other (comment) (scooter, CPAP)          Prior Functioning/Environment Prior Level of Function : Independent/Modified Independent;Driving             Mobility Comments: reports using rollator in commuinty as needed ADLs Comments: independent ADLs, IADLs, med mgmt        OT Problem List: Decreased strength;Decreased activity tolerance;Impaired balance (sitting and/or standing);Decreased safety awareness;Pain;Obesity      OT Treatment/Interventions: Self-care/ADL training;Therapeutic exercise;DME and/or AE instruction;Therapeutic activities;Patient/family education;Balance training;Energy conservation    OT Goals(Current goals can be found in the care plan section) Acute Rehab OT Goals Patient Stated Goal: feel better OT Goal Formulation: With patient Time For Goal Achievement: 08/11/22 Potential to Achieve Goals: Good  OT Frequency: Min  2X/week    Co-evaluation              AM-PAC OT "6 Clicks" Daily Activity     Outcome Measure Help from another person eating meals?: None Help from another person taking care of personal grooming?: A Little Help from another person toileting, which includes using toliet, bedpan, or urinal?: A Lot Help from another person bathing (including washing, rinsing, drying)?: A Little Help from another person to put on and taking off regular upper body clothing?: A Little Help from another person to put on and taking off regular lower body clothing?: A Little 6 Click Score: 18   End of Session Equipment Utilized During Treatment: Oxygen (2L) Nurse Communication: Mobility status;Other (comment) (diet)  Activity Tolerance: Patient limited by pain;Patient limited by fatigue Patient left: in bed;with call bell/phone within reach;with bed alarm set  OT Visit Diagnosis: Other abnormalities of gait and mobility (R26.89);Muscle weakness (generalized) (M62.81);Pain Pain - part of body:  (abd)                Time: 4098-1191 OT Time Calculation (min): 34 min Charges:  OT General Charges $OT Visit: 1 Visit OT Evaluation $OT Eval Moderate Complexity: 1 Mod OT Treatments $Self Care/Home Management : 8-22 mins  Barry Brunner, OT Acute Rehabilitation Services Office [phone]   Chancy Milroy 07/28/2022, 12:15 PM

## 2022-07-28 NOTE — TOC Initial Note (Signed)
Transition of Care [facility]) - Initial/Assessment Note    Patient Details  Name: Holly Smith MRN: [id number] Date of Birth: Jun 12, 1945  [facility]) CM/SW Contact:    Janae Bridgeman, RN Phone Number: 07/28/2022, 3:56 PM  Clinical Narrative:                 CM met with the patient and son at the bedside.  The patient was admitted for pneumonia and is currently requiring oxygen.  CPAP from home at this bedside.   DME at the home includes - Electric scooter, 2 RW, Cpap from [place], [place].    Home health services offered to the patient and the patient declined.    The patient lives with her son at his home at - [address] [place], [place] [address].  CM will continue to follow the patient for TOC needs to return home with son by car when medically stable for discharge.  Expected Discharge Plan: Home w Home Health Services Barriers to Discharge: Continued Medical Work up   Patient Goals and CMS Choice Patient states their goals for this hospitalization and ongoing recovery are:: Patient wants to feel better and return home CMS Medicare.gov Compare Post Acute Care list provided to:: Patient Choice offered to / list presented to : Patient [place] ownership interest in [facility].provided to:: Patient    Expected Discharge Plan and Services   Discharge Planning Services: CM Consult   Living arrangements for the past 2 months: Single Family Home (Lives with son in his home in [place], [place])                                      Prior Living Arrangements/Services Living arrangements for the past 2 months: Single Family Home (Lives with son in his home in [place], [place]) Lives with:: Adult Children Patient language and need for interpreter reviewed:: Yes Do you feel safe going back to the place where you live?: Yes      Need for Family Participation in Patient Care: Yes (Comment) Care giver support system in place?: Yes (comment) Current  home services: DME (CPAP at home, electric scooter, 2 RW, Cane, pt declines HH services) Criminal Activity/Legal Involvement Pertinent to Current Situation/Hospitalization: No - Comment as needed  Activities of Daily Living Home Assistive Devices/Equipment: None ADL Screening (condition at time of admission) Patient's cognitive ability adequate to safely complete daily activities?: Yes Is the patient deaf or have difficulty hearing?: No Does the patient have difficulty seeing, even when wearing glasses/contacts?: No Does the patient have difficulty concentrating, remembering, or making decisions?: No Patient able to express need for assistance with ADLs?: Yes Does the patient have difficulty dressing or bathing?: Yes Independently performs ADLs?: No Communication: Independent Dressing (OT): Independent Grooming: Independent Feeding: Independent Bathing: Needs assistance Is this a change from baseline?: Pre-admission baseline Toileting: Needs assistance Is this a change from baseline?: Change from baseline, expected to last >3days In/Out Bed: Needs assistance Is this a change from baseline?: Change from baseline, expected to last >3 days Does the patient have difficulty walking or climbing stairs?: Yes Weakness of Legs: Right Weakness of Arms/Hands: Left  Permission Sought/Granted Permission sought to share information with : Case Manager, Family Supports          Permission granted to share info w Relationship: sob - Azra Pantel - [phone]     Emotional Assessment Appearance::  Appears stated age Attitude/Demeanor/Rapport: Gracious Affect (typically observed): Accepting Orientation: : Oriented to Self, Oriented to Place, Oriented to  Time, Oriented to Situation Alcohol / Substance Use: Not Applicable (Quit smoking in 2013) Psych Involvement: No (comment)  Admission diagnosis:  Acute cystitis [N30.00] Dyspnea, unspecified type [R06.00] Patient Active Problem List    Diagnosis Date Noted   Acute respiratory failure with hypoxia (HCC) 07/27/2022   Gastroenteritis 07/27/2022   Acute cystitis 07/27/2022   Morbid obesity with body mass index (BMI) of 40.0 or higher (HCC) 05/25/2022   OSA on CPAP 05/25/2022   Insomnia 05/25/2022   CAD (coronary artery disease) 10/08/2020   Type 2 diabetes mellitus without complication, without long-term current use of insulin (HCC) 10/08/2020   Stable angina    Lichen planus 05/31/2018   Vertigo 03/11/2017   Sepsis (HCC) 12/13/2016   Dyspepsia    Gastritis without bleeding    Other specified diseases of esophagus    History of alcohol use 09/07/2016   Hypertensive heart disease 05/12/2016   Atrial fibrillation, persistent (HCC) 08/13/2015   COPD (chronic obstructive pulmonary disease) (HCC) 08/13/2015   Personal history of other specified conditions 08/13/2015   Insulin resistance 08/13/2015   Obesity, unspecified 08/13/2015   Special screening for malignant neoplasms, colon    Benign neoplasm of ascending colon    Benign neoplasm of descending colon    Benign neoplasm of sigmoid colon    Chronic diastolic heart failure (HCC) 05/05/2015   Diverticulitis 01/31/2015   Diverticulitis large intestine w/o perforation or abscess w/bleeding 01/28/2015   Chronic combined systolic and diastolic CHF, NYHA class 3 (HCC) 01/28/2015   Acute diverticulitis 01/28/2015   Diverticulitis of large intestine without perforation or abscess with bleeding    Acute on chronic diastolic heart failure (HCC) 01/21/2015   Hypertensive urgency 01/21/2015   Morbid obesity (HCC) 01/21/2015   Blood in stool 01/21/2015   Acute diastolic CHF (congestive heart failure) (HCC) 01/21/2015   CAFL (chronic airflow limitation) (HCC) 12/16/2014   Personal history of perinatal problems 12/16/2014   Hyperlipidemia, unspecified 12/16/2014   Dysmetabolic syndrome 12/16/2014   Kidney stones 12/16/2014   PVC (premature ventricular contraction) 12/16/2014    Snoring 12/16/2014   Stress incontinence 12/16/2014   Fast heart beat 12/16/2014   Calculus (=stone) 12/16/2014   OSA (obstructive sleep apnea) 11/27/2014   Dyspnea 10/07/2014   Essential hypertension 10/07/2014   Obesity 10/07/2014   Genital warts 08/02/2014   Condyloma acuminatum of vulva 08/02/2014   Condylomata lata of vulva 08/02/2014   Anticoagulation adequate 08/02/2014   Chronic atrial fibrillation (HCC) 08/02/2014   Current tobacco use 08/02/2014   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   Malignant neoplasm of vulva (HCC) 06/19/2014   Vulvar cancer (HCC) 05/10/2014   Vulvar cancer, carcinoma (HCC) 05/10/2014   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 [place] [address] Phone: [phone] Fax: [phone]  [facility] [address] [place] - [address] [facility] [place] [address] Phone: [phone] Fax: [phone]     Social Determinants  of Health (SDOH) Social History: SDOH Screenings   Food Insecurity: No Food Insecurity (07/28/2022)  Housing: Low Risk  (07/28/2022)  Transportation Needs: No Transportation Needs (07/28/2022)  Utilities: Not At Risk (07/28/2022)  Alcohol Screen: Low Risk  (06/28/2022)  Depression (PHQ2-9): Low Risk  (06/29/2022)  Financial Resource Strain: Low Risk  (06/28/2022)  Physical Activity: Insufficiently Active (06/28/2022)  Social Connections: Unknown (06/28/2022)  Stress: No Stress Concern Present (06/28/2022)  Tobacco Use: Medium Risk  (07/27/2022)   SDOH Interventions:     Readmission Risk Interventions    07/28/2022    3:56 PM  Readmission Risk Prevention Plan  Post Dischage Appt Complete  Medication Screening Complete  Transportation Screening Complete

## 2022-07-28 NOTE — Progress Notes (Signed)
Patient has home CPAP at bedside however prefers to wear [place] tonight.  Patient VSS.

## 2022-07-28 NOTE — Telephone Encounter (Signed)
Pt's daughter called stating that the pt is not able to use her cpap machine due to her having the nose pillows instead of a face mask. Pt is a mouth breather and is needing a face mask not the pillows and she is also needing the chin strap to help as well. Daughter would like to know if the prescription can be sent in as soon as possible due to the fact that she is in the hospital on Oxygen and not able to use machine with the nose pillow. Please advise.

## 2022-07-28 NOTE — Telephone Encounter (Signed)
Pt's granddaughter, Mardella Layman called to seek guidance from Dr. Carmelia Roller. Mardella Layman said that the lab results show that her grandmother has sepsis and then when you ask the staff at the hospital she is not septic. She said that they have sign on the door (infectious disease) so the staff has to suit up to go in her room. She said now she's on all these antibiotics. They have or are supposed to be testing for C Diff and her son has diarrhea now so she is worried that she may need to get him tested for that. Pt is supposed to be refitted for her oxygen mask because her O2 levels are dropping with the nose cannula only. Granddaughter is concerned she is not getting the full story and would like clarification from Dr. Carmelia Roller. She said it's okay to call or send mychart message. Her number is [phone]. Please call to advise

## 2022-07-28 NOTE — Plan of Care (Signed)
  Problem: Education: Goal: Ability to describe self-care measures that may prevent or decrease complications (Diabetes Survival Skills Education) will improve 07/28/2022 1752 by Earlie Raveling, RN Outcome: Progressing 07/28/2022 1752 by Earlie Raveling, RN Outcome: Progressing Goal: Individualized Educational Video(s) 07/28/2022 1752 by Earlie Raveling, RN Outcome: Progressing 07/28/2022 1752 by Earlie Raveling, RN Outcome: Progressing   Problem: Coping: Goal: Ability to adjust to condition or change in health will improve 07/28/2022 1752 by Earlie Raveling, RN Outcome: Progressing 07/28/2022 1752 by Earlie Raveling, RN Outcome: Progressing   Problem: Fluid Volume: Goal: Ability to maintain a balanced intake and output will improve 07/28/2022 1752 by Earlie Raveling, RN Outcome: Progressing 07/28/2022 1752 by Earlie Raveling, RN Outcome: Progressing   Problem: Health Behavior/Discharge Planning: Goal: Ability to identify and utilize available resources and services will improve 07/28/2022 1752 by Earlie Raveling, RN Outcome: Progressing 07/28/2022 1752 by Earlie Raveling, RN Outcome: Progressing Goal: Ability to manage health-related needs will improve 07/28/2022 1752 by Earlie Raveling, RN Outcome: Progressing 07/28/2022 1752 by Earlie Raveling, RN Outcome: Progressing   Problem: Metabolic: Goal: Ability to maintain appropriate glucose levels will improve 07/28/2022 1752 by Earlie Raveling, RN Outcome: Progressing 07/28/2022 1752 by Earlie Raveling, RN Outcome: Progressing   Problem: Nutritional: Goal: Maintenance of adequate nutrition will improve 07/28/2022 1752 by Earlie Raveling, RN Outcome: Progressing 07/28/2022 1752 by Earlie Raveling, RN Outcome: Progressing Goal: Progress toward achieving an optimal weight will improve 07/28/2022 1752 by Earlie Raveling, RN Outcome: Progressing 07/28/2022 1752 by Earlie Raveling, RN Outcome: Progressing   Problem: Skin Integrity: Goal: Risk for  impaired skin integrity will decrease 07/28/2022 1752 by Earlie Raveling, RN Outcome: Progressing 07/28/2022 1752 by Earlie Raveling, RN Outcome: Progressing   Problem: Tissue Perfusion: Goal: Adequacy of tissue perfusion will improve 07/28/2022 1752 by Earlie Raveling, RN Outcome: Progressing 07/28/2022 1752 by Earlie Raveling, RN Outcome: Progressing   Problem: Education: Goal: Knowledge of General Education information will improve Description: Including pain rating scale, medication(s)/side effects and non-pharmacologic comfort measures Outcome: Progressing   Problem: Health Behavior/Discharge Planning: Goal: Ability to manage health-related needs will improve Outcome: Progressing   Problem: Clinical Measurements: Goal: Ability to maintain clinical measurements within normal limits will improve Outcome: Progressing Goal: Will remain free from infection Outcome: Progressing Goal: Diagnostic test results will improve Outcome: Progressing Goal: Respiratory complications will improve Outcome: Progressing Goal: Cardiovascular complication will be avoided Outcome: Progressing   Problem: Activity: Goal: Risk for activity intolerance will decrease Outcome: Progressing   Problem: Nutrition: Goal: Adequate nutrition will be maintained Outcome: Progressing   Problem: Coping: Goal: Level of anxiety will decrease Outcome: Progressing   Problem: Elimination: Goal: Will not experience complications related to bowel motility Outcome: Progressing Goal: Will not experience complications related to urinary retention Outcome: Progressing   Problem: Pain Managment: Goal: General experience of comfort will improve Outcome: Progressing   Problem: Safety: Goal: Ability to remain free from injury will improve Outcome: Progressing   Problem: Skin Integrity: Goal: Risk for impaired skin integrity will decrease Outcome: Progressing

## 2022-07-28 NOTE — Progress Notes (Signed)
RT was able to attach our full face mask to patients home CPAP with 4L O2 bleed in.  Patient resting well.  RT will continue to monitor.

## 2022-07-28 NOTE — Telephone Encounter (Signed)
Ok great. The order on file should cover the patient to get whatever mask best fits her. If order for chin strap Is needed as well, we can send for that.

## 2022-07-29 DIAGNOSIS — J9601 Acute respiratory failure with hypoxia: Secondary | ICD-10-CM | POA: Diagnosis not present

## 2022-07-29 LAB — CBC
HCT: 35.2 % — ABNORMAL LOW (ref 36.0–46.0)
Hemoglobin: 11.7 g/dL — ABNORMAL LOW (ref 12.0–15.0)
MCH: 32 pg (ref 26.0–34.0)
MCHC: 33.2 g/dL (ref 30.0–36.0)
MCV: 96.2 fL (ref 80.0–100.0)
Platelets: 178 10*3/uL (ref 150–400)
RBC: 3.66 MIL/uL — ABNORMAL LOW (ref 3.87–5.11)
RDW: 13.8 % (ref 11.5–15.5)
WBC: 10.5 10*3/uL (ref 4.0–10.5)
nRBC: 0 % (ref 0.0–0.2)

## 2022-07-29 LAB — COMPREHENSIVE METABOLIC PANEL
ALT: 17 U/L (ref 0–44)
AST: 14 U/L — ABNORMAL LOW (ref 15–41)
Albumin: 2.5 g/dL — ABNORMAL LOW (ref 3.5–5.0)
Alkaline Phosphatase: 40 U/L (ref 38–126)
Anion gap: 5 (ref 5–15)
BUN: 15 mg/dL (ref 8–23)
CO2: 24 mmol/L (ref 22–32)
Calcium: 8.6 mg/dL — ABNORMAL LOW (ref 8.9–10.3)
Chloride: 105 mmol/L (ref 98–111)
Creatinine, Ser: 1.1 mg/dL — ABNORMAL HIGH (ref 0.44–1.00)
GFR, Estimated: 51 mL/min — ABNORMAL LOW (ref >60)
Glucose, Bld: 136 mg/dL — ABNORMAL HIGH (ref 70–99)
Potassium: 3.6 mmol/L (ref 3.5–5.1)
Sodium: 134 mmol/L — ABNORMAL LOW (ref 135–145)
Total Bilirubin: 1 mg/dL (ref 0.3–1.2)
Total Protein: 6.2 g/dL — ABNORMAL LOW (ref 6.5–8.1)

## 2022-07-29 LAB — GLUCOSE, CAPILLARY
Glucose-Capillary: 133 mg/dL — ABNORMAL HIGH (ref 70–99)
Glucose-Capillary: 155 mg/dL — ABNORMAL HIGH (ref 70–99)
Glucose-Capillary: 149 mg/dL — ABNORMAL HIGH (ref 70–99)
Glucose-Capillary: 142 mg/dL — ABNORMAL HIGH (ref 70–99)

## 2022-07-29 LAB — MAGNESIUM: Magnesium: 2.1 mg/dL (ref 1.7–2.4)

## 2022-07-29 LAB — LIPASE, BLOOD: Lipase: 37 U/L (ref 11–51)

## 2022-07-29 MED ORDER — GLIPIZIDE 5 MG PO TABS
5.0000 mg | ORAL_TABLET | Freq: Every day | ORAL | Status: DC
  Administered 2022-07-30: 5 mg via ORAL
  Filled 2022-07-29: qty 1

## 2022-07-29 MED ORDER — GLIPIZIDE 2.5 MG HALF TABLET: 2.5000 mg | ORAL_TABLET | Freq: Every day | ORAL | Status: DC

## 2022-07-29 MED ORDER — AMOXICILLIN-POT CLAVULANATE 875-125 MG PO TABS
1.0000 | ORAL_TABLET | Freq: Two times a day (BID) | ORAL | Status: DC
  Administered 2022-07-30: 1 via ORAL
  Filled 2022-07-29: qty 1

## 2022-07-29 NOTE — Progress Notes (Signed)
Holly Smith  ZOX:[id number] DOB: Dec 21, 1945 DOA: 07/27/2022 PCP: [name], DO    Brief Narrative:  77 year old with a history of permanent atrial fibrillation, CAD, chronic systolic CHF, COPD, HTN, HLD, DM2, IBS with chronic diarrhea, and staghorn renal calculi who was seen by her PCP 2 weeks prior to this presentation with burning urination and bilateral flank pain and diagnosed with UTI.  She was initially prescribed Keflex which was then changed to nitrofurantoin due to culture data.  She presented to the ER 07/27/2022 with failure to improve as evidenced by persistent nausea vomiting and diarrhea.  Of note she had resumed Trulicity 07/17/2022 after previously stopping it due to persisting nausea vomiting and diarrhea.  In the ER CTa of the chest was negative for PE but did reveal consolidation in the lingula/left lower lobe.  UA noted only rare bacteria.  CT renal stone noted mild descending: Diverticulitis/colitis.  Consultants:  None  Goals of Care:  Code Status: Full Code   DVT prophylaxis: Xarelto  Interim Hx: Tmax 99.4 since admission.  Vital signs otherwise stable.  Tolerated full facemask CPAP last night.  Feels much improved today and is asking about when she can go home.  Denies any further shortness of breath and states her breathing has returned to baseline.  Denies fevers or chills.  Diarrhea has fully stopped.  Patient is tolerating some oral intake thus far.  Assessment & Plan:  Left lower lobe aspiration pneumonia  Continue antibiotics for 5-day course- procalcitonin argues for a bacterial lung infection - suspect this represents an aspiration event -clinically improving -transition to oral antibiotics tomorrow morning  Gastroenteritis v/s side effect of Trulicity - persistent N/V/D Likely due to Trulicity, which has been stopped - metformin also a poor choice in someone having GI sx - monitor - low suspicion for infectious diarrhea - diarrhea has now  resolved - d/c isolation - avoid further use of Trulicity   Recent UTI versus pyelonephritis Patient reports ongoing low back and flank pain - original culture data 07/16/2022 notes E. Coli - UA this admission unimpressive - suspect pain due to pulled muscles related to frequent wretching/vomiting   DM2 A1c 7.8 - monitor trend off Trulicity - will likely need alternate agent for control at home   Chronic persistent atrial fibrillation Continue usual metoprolol and Xarelto - heart rate is controlled  CAD Continue usual beta-blocker, as needed nitro, and Farxiga -asymptomatic  Chronic systolic CHF Holding diuretic as patient appears dehydrated -continue Lopressor and Farxiga  COPD Well compensated presently   Family Communication:  Disposition: From home -anticipate patient return home when stabilized  Objective: Blood pressure (!) 89/74, pulse 76, temperature 98.3 F (36.8 C), resp. rate 17, height 5' (1.524 m), weight 90.7 kg, SpO2 97 %.  Intake/Output Summary (Last 24 hours) at 07/29/2022 1630 Last data filed at 07/29/2022 1310 Gross per 24 hour  Intake 270 ml  Output --  Net 270 ml   Filed Weights   07/27/22 1518  Weight: 90.7 kg    Examination: General: No acute respiratory distress Lungs: Clear to auscultation bilaterally - no wheeze  Cardiovascular: Regular rate and without murmur gallop or rub  Abdomen: Nontender, nondistended, soft, bowel sounds positive, no rebound, no ascites Extremities: No significant edema bilateral lower extremities  CBC: Recent Labs  Lab 07/27/22 1545 07/27/22 1551 07/28/22 0609 07/29/22 0808  WBC 19.4*  --  16.8* 10.5  NEUTROABS 16.7*  --  13.8*  --   HGB 15.1* 16.3*  16.0* 13.0 11.7*  HCT 48.1* 48.0*  47.0* 39.7 35.2*  MCV 99.4  --  96.1 96.2  PLT 229  --  194 178   Basic Metabolic Panel: Recent Labs  Lab 07/27/22 1545 07/27/22 1551 07/28/22 0609 07/29/22 0808  NA 140 142  141 137 134*  K 3.9 4.0  4.0 3.6 3.6  CL 102  106 103 105  CO2 25  --  25 24  GLUCOSE 186* 186* 150* 136*  BUN 20 25* 16 15  CREATININE 1.32* 1.40* 1.22* 1.10*  CALCIUM 9.2  --  8.4* 8.6*  MG  --   --   --  2.1   GFR: Estimated Creatinine Clearance: 42.3 mL/min (A) (by C-G formula based on SCr of 1.1 mg/dL (H)).   Scheduled Meds:  cyanocobalamin  1,000 mcg Oral QODAY   insulin aspart  0-15 Units Subcutaneous TID WC   insulin aspart  0-5 Units Subcutaneous QHS   melatonin  5 mg Oral QHS   metoprolol tartrate  150 mg Oral BID   Rivaroxaban  15 mg Oral Q supper   Continuous Infusions:  piperacillin-tazobactam (ZOSYN)  IV 3.375 g (07/29/22 1310)     LOS: 2 days   Lonia Blood, MD [facility] Office  [phone] Pager - Text Page per Loretha Stapler  If 7PM-7AM, please contact night-coverage per Amion 07/29/2022, 4:30 PM

## 2022-07-29 NOTE — Telephone Encounter (Signed)
Mychart message sent.

## 2022-07-29 NOTE — Progress Notes (Signed)
Occupational Therapy Treatment Patient Details Name: Holly Smith MRN: [id number] DOB: [birth year] Today's Date: 07/29/2022   History of present illness Pt is a 77 y/o F admitted on 07/27/22 after presenting with c/o N&V & diarrhea, as well as SOB. Pt is being treated for ARF, platelike atelectasis suspect PNA. Of note, pt was diagnosed with UTI 2 weeks ago by PCP. PMH: permanent a-fib, CAD, chronic systolic heart failure, COPD, HTN, HLD, DM2, IBS diarrheal type, staghorn calculus, OSA, vulvar CA   OT comments  Patient received in supine and agreeable to participating with OT. Patient required min assist to get to EOB and had complaints of dizziness once on EOB with increased time to subside. Patient asking to use [facility] with min guard assist for safety due to dizziness. LB dressing performed seated on BSC with min assist and patient able to stand at sink for grooming with min guard assist. Patient making good progress with OT treatment and discharge recommendations for HHOT continue to be appropriate. Acute OT to continue to follow.    Recommendations for follow up therapy are one component of a multi-disciplinary discharge planning process, led by the attending physician.  Recommendations may be updated based on patient status, additional functional criteria and insurance authorization.    Assistance Recommended at Discharge Intermittent Supervision/Assistance  Patient can return home with the following  A little help with walking and/or transfers;A little help with bathing/dressing/bathroom;Assistance with cooking/housework;Assist for transportation;Help with stairs or ramp for entrance   Equipment Recommendations  BSC/3in1    Recommendations for Other Services      Precautions / Restrictions Precautions Precautions: Fall Restrictions Weight Bearing Restrictions: No       Mobility Bed Mobility Overal bed mobility: Needs Assistance Bed Mobility: Sit to Supine     Supine to sit:  Min assist, HOB elevated     General bed mobility comments: increased time and use of bed rails, patient required increased time following due to dizziness    Transfers Overall transfer level: Needs assistance Equipment used: Rolling walker (2 wheels) Transfers: Sit to/from Stand, Bed to chair/wheelchair/BSC Sit to Stand: Min guard     Step pivot transfers: Min guard     General transfer comment: min guard for safety due to complaints of dizziness, cues for safe walker use     Balance Overall balance assessment: Needs assistance Sitting-balance support: No upper extremity supported, Feet supported Sitting balance-Leahy Scale: Fair     Standing balance support: Single extremity supported, Bilateral upper extremity supported, During functional activity Standing balance-Leahy Scale: Fair Standing balance comment: reliant on UE support when standing for self care tasks                           ADL either performed or assessed with clinical judgement   ADL Overall ADL's : Needs assistance/impaired     Grooming: Wash/dry hands;Wash/dry face;Oral care;Brushing hair;Supervision/safety;Standing;Sitting Grooming Details (indicate cue type and reason): Stood at sink for oral care and hand/face hygiene, performed combing hair seated             Lower Body Dressing: Minimal assistance;Sit to/from stand Lower Body Dressing Details (indicate cue type and reason): to donn pull up brief Toilet Transfer: Min guard;BSC/3in1;Rolling walker (2 wheels) Toilet Transfer Details (indicate cue type and reason): cues for safety and to stay wit RW Toileting- Clothing Manipulation and Hygiene: Minimal assistance;Sit to/from stand         General ADL Comments: frequent rest  breaks due to dizziness    Extremity/Trunk Assessment              Vision       Perception     Praxis      Cognition Arousal/Alertness: Awake/alert Behavior During Therapy: WFL for tasks  assessed/performed Overall Cognitive Status: Within Functional Limits for tasks assessed                                          Exercises      Shoulder Instructions       General Comments Patient asking for 2 liters of O2 due to feeling  short of breath    Pertinent Vitals/ Pain       Pain Assessment Pain Assessment: Faces Faces Pain Scale: Hurts a little bit Pain Location: generalized Pain Descriptors / Indicators: Discomfort Pain Intervention(s): Monitored during session  Home Living                                          Prior Functioning/Environment              Frequency  Min 2X/week        Progress Toward Goals  OT Goals(current goals can now be found in the care plan section)  Progress towards OT goals: Progressing toward goals  Acute Rehab OT Goals Patient Stated Goal: get stronger OT Goal Formulation: With patient Time For Goal Achievement: 08/11/22 Potential to Achieve Goals: Good ADL Goals Pt Will Perform Grooming: with modified independence;sitting;standing Pt Will Perform Lower Body Dressing: with modified independence;sit to/from stand;sitting/lateral leans Pt Will Transfer to Toilet: with modified independence;ambulating;bedside commode Pt Will Perform Toileting - Clothing Manipulation and hygiene: with modified independence;sit to/from stand;sitting/lateral leans Additional ADL Goal #1: Pt will utilize energy conservation techniques during ADl routine to optimize tolerance and safety.  Plan Discharge plan remains appropriate    Co-evaluation                 AM-PAC OT "6 Clicks" Daily Activity     Outcome Measure   Help from another person eating meals?: None Help from another person taking care of personal grooming?: A Little Help from another person toileting, which includes using toliet, bedpan, or urinal?: A Lot Help from another person bathing (including washing, rinsing, drying)?: A  Little Help from another person to put on and taking off regular upper body clothing?: A Little Help from another person to put on and taking off regular lower body clothing?: A Little 6 Click Score: 18    End of Session Equipment Utilized During Treatment: Oxygen;Rolling walker (2 wheels) (2 liters)  OT Visit Diagnosis: Other abnormalities of gait and mobility (R26.89);Muscle weakness (generalized) (M62.81);Pain   Activity Tolerance Patient tolerated treatment well   Patient Left in chair;with call bell/phone within reach;with chair alarm set   Nurse Communication Mobility status        Time: 9629-5284 OT Time Calculation (min): 37 min  Charges: OT General Charges $OT Visit: 1 Visit OT Treatments $Self Care/Home Management : 23-37 mins  Alfonse Flavors, OTA Acute Rehabilitation Services  Office [phone]   Dewain Penning 07/29/2022, 2:10 PM

## 2022-07-29 NOTE — Plan of Care (Signed)

## 2022-07-30 ENCOUNTER — Other Ambulatory Visit (HOSPITAL_COMMUNITY): Payer: Self-pay

## 2022-07-30 DIAGNOSIS — J9601 Acute respiratory failure with hypoxia: Secondary | ICD-10-CM | POA: Diagnosis not present

## 2022-07-30 LAB — CULTURE, BLOOD (ROUTINE X 2)

## 2022-07-30 LAB — GLUCOSE, CAPILLARY
Glucose-Capillary: 131 mg/dL — ABNORMAL HIGH (ref 70–99)
Glucose-Capillary: 139 mg/dL — ABNORMAL HIGH (ref 70–99)

## 2022-07-30 MED ORDER — METHOCARBAMOL 500 MG PO TABS
500.0000 mg | ORAL_TABLET | Freq: Four times a day (QID) | ORAL | 0 refills | Status: DC | PRN
  Filled 2022-07-30: qty 20, 5d supply, fill #0

## 2022-07-30 MED ORDER — OXYCODONE HCL 5 MG PO TABS
5.0000 mg | ORAL_TABLET | Freq: Four times a day (QID) | ORAL | 0 refills | Status: DC | PRN
  Filled 2022-07-30: qty 10, 3d supply, fill #0

## 2022-07-30 MED ORDER — TORSEMIDE 20 MG PO TABS: ORAL_TABLET | ORAL | 0 refills | Status: DC

## 2022-07-30 MED ORDER — IRBESARTAN 75 MG PO TABS: 75.0000 mg | ORAL_TABLET | Freq: Every day | ORAL | 2 refills | Status: DC

## 2022-07-30 MED ORDER — AMOXICILLIN-POT CLAVULANATE 875-125 MG PO TABS
1.0000 | ORAL_TABLET | Freq: Two times a day (BID) | ORAL | 0 refills | Status: AC
  Filled 2022-07-30: qty 5, 3d supply, fill #0

## 2022-07-30 MED ORDER — GLIPIZIDE 5 MG PO TABS
5.0000 mg | ORAL_TABLET | Freq: Every day | ORAL | 2 refills | Status: DC
  Filled 2022-07-30: qty 30, 30d supply, fill #0

## 2022-07-30 NOTE — Plan of Care (Signed)

## 2022-07-30 NOTE — Discharge Summary (Signed)
DISCHARGE SUMMARY  Holly Smith  MR#: [id number]  DOB:1945-10-02  Date of Admission: 07/27/2022 Date of Discharge: 07/30/2022  Attending Physician:Perian Tedder Silvestre Gunner, MD  Patient's WUJ:[id number], Holly Roche, DO  Consults: none  Disposition: D/C home   Follow-up Appts:  Follow-up Information     [name], DO Follow up.   Specialty: Family Medicine Contact information: [address] [place] [address] [phone]                 Tests Needing Follow-up: -will need outpatient follow-up of CBGs and probable further titration of her diabetic medication regimen  Discharge Diagnoses: Left lower lobe aspiration pneumonia  Gastroenteritis v/s side effect of Trulicity Recent UTI  DM2 Chronic persistent atrial fibrillation CAD Chronic systolic CHF COPD  Initial presentation: 77 year old with a history of permanent atrial fibrillation, CAD, chronic systolic CHF, COPD, HTN, HLD, DM2, IBS with chronic diarrhea, and staghorn renal calculi who was seen by her PCP 2 weeks prior to this presentation with burning urination and bilateral flank pain and diagnosed with UTI.  She was initially prescribed Keflex which was then changed to nitrofurantoin due to culture data.  She presented to the ER 07/27/2022 with persistent nausea vomiting and diarrhea.  Of note she had resumed Trulicity 07/17/2022 after previously stopping it due to persisting nausea vomiting and diarrhea.   In the ER CTa of the chest was negative for PE but did reveal consolidation in the lingula/left lower lobe.  UA noted only rare bacteria.  CT renal stone noted mild descending colon diverticulitis/colitis.  Hospital Course:  Left lower lobe aspiration pneumonia  Continue antibiotics for 5-day course- procalcitonin argues for a bacterial lung infection - suspect this represents an aspiration event -clinically improving -transitioned to oral antibiotics on day of d/c to  complete 5 days of total tx    Gastroenteritis v/s side effect of Trulicity - persistent N/V/D Likely due to Trulicity, which has been stopped - metformin also a poor choice in someone having GI sx - low suspicion for infectious diarrhea clinically - diarrhea has resolved and patient is tolerating a regular diet prior to discharge - avoid further use of Trulicity   Recent UTI  Patient reports ongoing low back and flank pain - original culture data 07/16/2022 notes E. Coli - UA this admission unimpressive - suspect pain due to pulled muscles related to frequent wretching/vomiting -symptoms much improved at time of discharge   DM2 A1c 7.8 -was well-controlled at home with Trulicity but unfortunately the side effects were too severe to bear -CBG presently well-controlled using sliding scale insulin -will not resume metformin due to its propensity to cause initial GI side effects -have initiated a trial of simple Glucotrol -will need outpatient follow-up of CBGs and probable further titration of her diabetic medication regimen - instruction provided for patient to follow CBG closely and provide log to her PCP in f/u   Chronic persistent atrial fibrillation Continue usual metoprolol and Xarelto - heart rate controlled   CAD Continue usual beta-blocker, as needed nitro, and Farxiga - asymptomatic   Chronic systolic CHF held diuretic during hospitalization as patient appears dehydrated -continue Lopressor and Marcelline Deist -resume diuretic dosing upon discharge home   COPD Well compensated during this hospital stay  Allergies as of 07/30/2022       Reactions   Ciprofloxacin Other (See Comments)   SOB        Medication List     STOP taking these medications  cephALEXin 500 MG capsule Commonly known as: KEFLEX   Trulicity 0.75 MG/0.5ML Sopn Generic drug: Dulaglutide   Trulicity 1.5 MG/0.5ML Sopn Generic drug: Dulaglutide       TAKE these medications    amoxicillin-clavulanate  875-125 MG tablet Commonly known as: AUGMENTIN Take 1 tablet by mouth every 12 (twelve) hours for 5 doses.   BIOTIN PO Take 1 tablet by mouth daily.   cyanocobalamin 1000 MCG tablet Commonly known as: VITAMIN B12 Take 1,000 mcg by mouth every other day.   diclofenac Sodium 1 % Gel Commonly known as: VOLTAREN Apply topically.   Farxiga 10 MG Tabs tablet Generic drug: dapagliflozin propanediol Take 10 mg by mouth daily.   fluticasone 50 MCG/ACT nasal spray Commonly known as: FLONASE Place 2 sprays into both nostrils daily.   glipiZIDE 5 MG tablet Commonly known as: GLUCOTROL Take 1 tablet (5 mg total) by mouth daily before breakfast. Start taking on: Jul 31, 2022   irbesartan 75 MG tablet Commonly known as: AVAPRO Take 1 tablet (75 mg total) by mouth daily. Start taking on: Aug 02, 2022 What changed: These instructions start on Aug 02, 2022. If you are unsure what to do until then, ask your doctor or other care provider.   methocarbamol 500 MG tablet Commonly known as: ROBAXIN Take 1 tablet (500 mg total) by mouth every 6 (six) hours as needed for muscle spasms.   metoprolol tartrate 100 MG tablet Commonly known as: LOPRESSOR TAKE 1 & 1/2 TABS (150MG ) BY MOUTH 2 (TWO) TIMES DAILY. What changed: See the new instructions.   nitroGLYCERIN 0.4 MG SL tablet Commonly known as: Nitrostat Place 1 tablet (0.4 mg total) under the tongue every 5 (five) minutes as needed.   oxyCODONE 5 MG immediate release tablet Commonly known as: Oxy IR/ROXICODONE Take 1 tablet (5 mg total) by mouth every 6 (six) hours as needed for severe pain.   pantoprazole 40 MG tablet Commonly known as: PROTONIX Take 1 tablet (40 mg total) by mouth daily.   Rivaroxaban 15 MG Tabs tablet Commonly known as: XARELTO Take 1 tablet (15 mg total) by mouth daily with supper.   torsemide 20 MG tablet Commonly known as: DEMADEX Take 2 tablets (40 mg) PO daily Start taking on: Jul 31, 2022   Toviaz 4 MG  Tb24 tablet Generic drug: fesoterodine Take 4 mg by mouth daily.   traZODone 50 MG tablet Commonly known as: DESYREL Take 1 tablet (50 mg total) by mouth at bedtime as needed for sleep.   Vitamin D3 1000 units Caps Take by mouth.               Durable Medical Equipment  (From admission, onward)           Start     Ordered   07/28/22 1237  For home use only DME Walker rolling  Once       Question Answer Comment  Walker: With 5 Inch Wheels   Patient needs a walker to treat with the following condition General weakness      07/28/22 1236            Day of Discharge BP 139/84 (BP Location: Right Arm)   Pulse 88   Temp 98.8 F (37.1 C)   Resp 18   Ht 5' (1.524 m)   Wt 90.7 kg   SpO2 92%   BMI 39.06 kg/m   Physical Exam: General: No acute respiratory distress Lungs: Clear to auscultation bilaterally without wheezes or crackles  Cardiovascular: Regular rate and rhythm without murmur gallop or rub normal S1 and S2 Abdomen: Nontender, nondistended, soft, bowel sounds positive, no rebound, no ascites, no appreciable mass Extremities: No significant cyanosis, clubbing, or edema bilateral lower extremities  Basic Metabolic Panel: Recent Labs  Lab 07/27/22 1545 07/27/22 1551 07/28/22 0609 07/29/22 0808  NA 140 142  141 137 134*  K 3.9 4.0  4.0 3.6 3.6  CL 102 106 103 105  CO2 25  --  25 24  GLUCOSE 186* 186* 150* 136*  BUN 20 25* 16 15  CREATININE 1.32* 1.40* 1.22* 1.10*  CALCIUM 9.2  --  8.4* 8.6*  MG  --   --   --  2.1    CBC: Recent Labs  Lab 07/27/22 1545 07/27/22 1551 07/28/22 0609 07/29/22 0808  WBC 19.4*  --  16.8* 10.5  NEUTROABS 16.7*  --  13.8*  --   HGB 15.1* 16.3*  16.0* 13.0 11.7*  HCT 48.1* 48.0*  47.0* 39.7 35.2*  MCV 99.4  --  96.1 96.2  PLT 229  --  194 178     Time spent in discharge (includes decision making & examination of pt): 35 minutes  07/30/2022, 8:46 AM   Lonia Blood, MD [facility] Office   [phone]

## 2022-07-30 NOTE — Plan of Care (Signed)

## 2022-07-30 NOTE — Care Management Important Message (Signed)
Important Message  Patient Details  Name: Holly Smith MRN: [id number] Date of Birth: [birth year]   Medicare Important Message Given:  Yes     Dorena Bodo 07/30/2022, 3:13 PM

## 2022-07-30 NOTE — Progress Notes (Signed)
Physical Therapy Treatment Patient Details Name: Holly Smith MRN: [id number] DOB: 1946/01/13 Today's Date: 07/30/2022   History of Present Illness Pt is a 77 y/o F admitted on 07/27/22 after presenting with c/o N&V & diarrhea, as well as SOB. Pt is being treated for ARF, platelike atelectasis suspect PNA. Of note, pt was diagnosed with UTI 2 weeks ago by PCP. PMH: permanent a-fib, CAD, chronic systolic heart failure, COPD, HTN, HLD, DM2, IBS diarrheal type, staghorn calculus, OSA, vulvar CA    PT Comments    Pt admitted with above diagnosis. Pt was able to ambulate with RW with supervision and good safety awareness overall.  Pt progressing well.   Pt currently with functional limitations due to balance and endurance deficits. Pt will benefit from acute skilled PT to increase their independence and safety with mobility to allow discharge.      Recommendations for follow up therapy are one component of a multi-disciplinary discharge planning process, led by the attending physician.  Recommendations may be updated based on patient status, additional functional criteria and insurance authorization.  Follow Up Recommendations       Assistance Recommended at Discharge Frequent or constant Supervision/Assistance  Patient can return home with the following A little help with bathing/dressing/bathroom;A little help with walking and/or transfers;Assist for transportation;Assistance with cooking/housework;Help with stairs or ramp for entrance   Equipment Recommendations   (Pt said she has all equipment needed)    Recommendations for Other Services       Precautions / Restrictions Precautions Precautions: Fall Restrictions Weight Bearing Restrictions: No     Mobility  Bed Mobility Overal bed mobility: Needs Assistance Bed Mobility: Sit to Supine, Rolling, Sidelying to Sit Rolling: Min assist Sidelying to sit: Mod assist   Sit to supine: Max assist, +2 for safety/equipment    General bed mobility comments: increased time and use of bed rails    Transfers Overall transfer level: Needs assistance Equipment used: Rolling walker (2 wheels) Transfers: Sit to/from Stand, Bed to chair/wheelchair/BSC Sit to Stand: Supervision   Step pivot transfers: Supervision       General transfer comment: Pt safe with walker use. Transfer to 3N1 with guard assist prior to walk.    Ambulation/Gait Ambulation/Gait assistance: Min guard Gait Distance (Feet): 180 Feet Assistive device: Rolling walker (2 wheels) Gait Pattern/deviations: Step-through pattern, Decreased stride length   Gait velocity interpretation: <1.31 ft/sec, indicative of household ambulator   General Gait Details: Pt overall safe with RW in controlled environment. No LOB with min challenges.   Stairs             Wheelchair Mobility    Modified Rankin (Stroke Patients Only)       Balance Overall balance assessment: Needs assistance Sitting-balance support: No upper extremity supported, Feet supported Sitting balance-Leahy Scale: Fair     Standing balance support: Single extremity supported, Bilateral upper extremity supported, During functional activity Standing balance-Leahy Scale: Fair Standing balance comment: Able to clean self in standing after having BM with good balance.                            Cognition Arousal/Alertness: Awake/alert Behavior During Therapy: WFL for tasks assessed/performed Overall Cognitive Status: Within Functional Limits for tasks assessed                                 General Comments: Appears WFL,  education/encouragement for OOB mobility.        Exercises      General Comments General comments (skin integrity, edema, etc.): O2 93% on RA with activity      Pertinent Vitals/Pain Pain Assessment Pain Assessment: Faces Faces Pain Scale: Hurts a little bit Breathing: normal Negative Vocalization: none Facial  Expression: smiling or inexpressive Body Language: relaxed Consolability: no need to console PAINAD Score: 0 Pain Location: generalized Pain Descriptors / Indicators: Discomfort Pain Intervention(s): Limited activity within patient's tolerance, Monitored during session, Repositioned    Home Living                          Prior Function            PT Goals (current goals can now be found in the care plan section) Acute Rehab PT Goals Patient Stated Goal: get better, rest, go home Progress towards PT goals: Progressing toward goals    Frequency    Min 3X/week      PT Plan Current plan remains appropriate    Co-evaluation              AM-PAC PT "6 Clicks" Mobility   Outcome Measure  Help needed turning from your back to your side while in a flat bed without using bedrails?: A Little Help needed moving from lying on your back to sitting on the side of a flat bed without using bedrails?: A Little Help needed moving to and from a bed to a chair (including a wheelchair)?: A Little Help needed standing up from a chair using your arms (e.g., wheelchair or bedside chair)?: A Little Help needed to walk in hospital room?: A Little Help needed climbing 3-5 steps with a railing? : A Little 6 Click Score: 18    End of Session Equipment Utilized During Treatment: Gait belt Activity Tolerance: Patient tolerated treatment well Patient left: in chair;with call bell/phone within reach;with chair alarm set Nurse Communication: Mobility status PT Visit Diagnosis: Muscle weakness (generalized) (M62.81);Difficulty in walking, not elsewhere classified (R26.2);Unsteadiness on feet (R26.81)     Time: 1610-9604 PT Time Calculation (min) (ACUTE ONLY): 28 min  Charges:  $Gait Training: 8-22 mins $Self Care/Home Management: 8-22                     [facility] M,PT Acute Rehab Services [phone]    Bevelyn Buckles 07/30/2022, 1:06 PM

## 2022-07-30 NOTE — Plan of Care (Signed)
Problem: Education: Goal: Ability to describe self-care measures that may prevent or decrease complications (Diabetes Survival Skills Education) will improve 07/30/2022 1050 by Redgie Grayer, RN Outcome: Adequate for Discharge 07/30/2022 1045 by Redgie Grayer, RN Outcome: Progressing Goal: Individualized Educational Video(s) 07/30/2022 1050 by Redgie Grayer, RN Outcome: Adequate for Discharge 07/30/2022 1045 by Redgie Grayer, RN Outcome: Progressing   Problem: Coping: Goal: Ability to adjust to condition or change in health will improve 07/30/2022 1050 by Redgie Grayer, RN Outcome: Adequate for Discharge 07/30/2022 1045 by Redgie Grayer, RN Outcome: Progressing   Problem: Fluid Volume: Goal: Ability to maintain a balanced intake and output will improve 07/30/2022 1050 by Redgie Grayer, RN Outcome: Adequate for Discharge 07/30/2022 1045 by Redgie Grayer, RN Outcome: Progressing   Problem: Health Behavior/Discharge Planning: Goal: Ability to identify and utilize available resources and services will improve 07/30/2022 1050 by Redgie Grayer, RN Outcome: Adequate for Discharge 07/30/2022 1045 by Redgie Grayer, RN Outcome: Progressing Goal: Ability to manage health-related needs will improve 07/30/2022 1050 by Redgie Grayer, RN Outcome: Adequate for Discharge 07/30/2022 1045 by Redgie Grayer, RN Outcome: Progressing   Problem: Metabolic: Goal: Ability to maintain appropriate glucose levels will improve 07/30/2022 1050 by Redgie Grayer, RN Outcome: Adequate for Discharge 07/30/2022 1045 by Redgie Grayer, RN Outcome: Progressing   Problem: Nutritional: Goal: Maintenance of adequate nutrition will improve 07/30/2022 1050 by Redgie Grayer, RN Outcome: Adequate for Discharge 07/30/2022 1045 by Redgie Grayer, RN Outcome: Progressing Goal: Progress toward achieving an optimal weight will improve 07/30/2022 1050 by Redgie Grayer, RN Outcome: Adequate for Discharge 07/30/2022 1045 by Redgie Grayer, RN Outcome: Progressing   Problem: Skin Integrity: Goal: Risk for impaired skin integrity will decrease 07/30/2022 1050 by Redgie Grayer, RN Outcome: Adequate for Discharge 07/30/2022 1045 by Redgie Grayer, RN Outcome: Progressing   Problem: Tissue Perfusion: Goal: Adequacy of tissue perfusion will improve 07/30/2022 1050 by Redgie Grayer, RN Outcome: Adequate for Discharge 07/30/2022 1045 by Redgie Grayer, RN Outcome: Progressing   Problem: Education: Goal: Knowledge of General Education information will improve Description: Including pain rating scale, medication(s)/side effects and non-pharmacologic comfort measures 07/30/2022 1050 by Redgie Grayer, RN Outcome: Adequate for Discharge 07/30/2022 1045 by Redgie Grayer, RN Outcome: Progressing   Problem: Health Behavior/Discharge Planning: Goal: Ability to manage health-related needs will improve 07/30/2022 1050 by Redgie Grayer, RN Outcome: Adequate for Discharge 07/30/2022 1045 by Redgie Grayer, RN Outcome: Progressing   Problem: Clinical Measurements: Goal: Ability to maintain clinical measurements within normal limits will improve 07/30/2022 1050 by Redgie Grayer, RN Outcome: Adequate for Discharge 07/30/2022 1045 by Redgie Grayer, RN Outcome: Progressing Goal: Will remain free from infection 07/30/2022 1050 by Redgie Grayer, RN Outcome: Adequate for Discharge 07/30/2022 1045 by Redgie Grayer, RN Outcome: Progressing Goal: Diagnostic test results will improve 07/30/2022 1050 by Redgie Grayer, RN Outcome: Adequate for Discharge 07/30/2022 1045 by Redgie Grayer, RN Outcome: Progressing Goal: Respiratory complications will improve 07/30/2022 1050 by Redgie Grayer, RN Outcome: Adequate for Discharge 07/30/2022 1045 by Redgie Grayer, RN Outcome: Progressing Goal: Cardiovascular complication will be avoided 07/30/2022 1050 by Redgie Grayer, RN Outcome: Adequate for Discharge 07/30/2022 1045 by Redgie Grayer, RN Outcome:  Progressing   Problem: Activity: Goal: Risk for activity intolerance will decrease 07/30/2022 1050 by Redgie Grayer, RN Outcome: Adequate for Discharge 07/30/2022 1045 by Redgie Grayer, RN Outcome: Progressing   Problem: Nutrition: Goal: Adequate nutrition will be maintained 07/30/2022 1050 by Redgie Grayer, RN Outcome: Adequate for Discharge 07/30/2022 1045 by Redgie Grayer, RN Outcome: Progressing  Problem: Coping: Goal: Level of anxiety will decrease 07/30/2022 1050 by Redgie Grayer, RN Outcome: Adequate for Discharge 07/30/2022 1045 by Redgie Grayer, RN Outcome: Progressing   Problem: Elimination: Goal: Will not experience complications related to bowel motility 07/30/2022 1050 by Redgie Grayer, RN Outcome: Adequate for Discharge 07/30/2022 1045 by Redgie Grayer, RN Outcome: Progressing Goal: Will not experience complications related to urinary retention 07/30/2022 1050 by Redgie Grayer, RN Outcome: Adequate for Discharge 07/30/2022 1045 by Redgie Grayer, RN Outcome: Progressing   Problem: Pain Managment: Goal: General experience of comfort will improve 07/30/2022 1050 by Redgie Grayer, RN Outcome: Adequate for Discharge 07/30/2022 1045 by Redgie Grayer, RN Outcome: Progressing   Problem: Safety: Goal: Ability to remain free from injury will improve 07/30/2022 1050 by Redgie Grayer, RN Outcome: Adequate for Discharge 07/30/2022 1045 by Redgie Grayer, RN Outcome: Progressing   Problem: Skin Integrity: Goal: Risk for impaired skin integrity will decrease 07/30/2022 1050 by Redgie Grayer, RN Outcome: Adequate for Discharge 07/30/2022 1045 by Redgie Grayer, RN Outcome: Progressing

## 2022-07-30 NOTE — Progress Notes (Signed)
Patient and son has been provide discharge instructions including medications. They both verbalize understanding of instructions.

## 2022-07-31 LAB — CULTURE, BLOOD (ROUTINE X 2): Special Requests: ADEQUATE

## 2022-08-01 LAB — CULTURE, BLOOD (ROUTINE X 2): Culture: NO GROWTH

## 2022-08-02 ENCOUNTER — Telehealth: Payer: Self-pay

## 2022-08-02 NOTE — Transitions of Care (Post Inpatient/ED Visit) (Signed)
   08/02/2022  Name: Holly Smith MRN: [id number] DOB: 1945-12-09  Today's TOC FU Call Status: Today's TOC FU Call Status:: Unsuccessul Call (1st Attempt) Unsuccessful Call (1st Attempt) Date: 08/02/22  Attempted to reach the patient regarding the most recent Inpatient/ED visit.  Follow Up Plan: Additional outreach attempts will be made to reach the patient to complete the Transitions of Care (Post Inpatient/ED visit) call.   Signature Karena Addison, LPN [facility] Nurse Health Advisor Direct Dial [phone]

## 2022-08-02 NOTE — Transitions of Care (Post Inpatient/ED Visit) (Signed)
08/02/2022  Name: Holly Smith MRN: [id number] DOB: 30-Jun-1945  Today's TOC FU Call Status: Today's TOC FU Call Status:: Successful TOC FU Call Competed Unsuccessful Call (1st Attempt) Date: 08/02/22 [facility] FU Call Complete Date: 08/02/22  Transition Care Management Follow-up Telephone Call Date of Discharge: 07/30/22 Discharge Facility: [facility]) Type of Discharge: Inpatient Admission Primary Inpatient Discharge Diagnosis:: dysnea How have you been since you were released from the hospital?: Better Any questions or concerns?: No  Items Reviewed: Did you receive and understand the discharge instructions provided?: Yes Medications obtained,verified, and reconciled?: Yes (Medications Reviewed) Any new allergies since your discharge?: No Dietary orders reviewed?: Yes Do you have support at home?: Yes People in Home: grandchild(ren)  Medications Reviewed Today: Medications Reviewed Today     Reviewed by Karena Addison, LPN (Licensed Practical Nurse) on 08/02/22 at [phone]  Med List Status: <None>   Medication Order Taking? Sig Documenting Provider Last Dose Status Informant  amoxicillin-clavulanate (AUGMENTIN) 875-125 MG tablet [id number] Yes Take 1 tablet by mouth every 12 (twelve) hours for 5 doses. Lonia Blood, MD Taking Active   BIOTIN PO [id number] Yes Take 1 tablet by mouth daily.  [provider] Taking Active Child, Pharmacy Records  Cholecalciferol (VITAMIN D3) 1000 units CAPS [id number] Yes Take by mouth. [provider] Taking Active Child, Pharmacy Records  diclofenac Sodium (VOLTAREN) 1 % GEL [id number] Yes Apply topically. [provider] Taking Active Child, Pharmacy Records  FARXIGA 10 MG TABS tablet [id number] Yes Take 10 mg by mouth daily. [provider] Taking Active Child, Pharmacy Records  fluticasone [facility]) 50 MCG/ACT nasal spray [id number] Yes Place 2 sprays into both nostrils daily. Bennie Pierini, FNP  Taking Active Child, Pharmacy Records  glipiZIDE (GLUCOTROL) 5 MG tablet [id number] Yes Take 1 tablet (5 mg total) by mouth daily before breakfast. Lonia Blood, MD Taking Active   irbesartan (AVAPRO) 75 MG tablet [id number] Yes Take 1 tablet (75 mg total) by mouth daily. Lonia Blood, MD Taking Active   meclizine (ANTIVERT) tablet 12.5 mg [id number]   [name], PA-C  Active   methocarbamol (ROBAXIN) 500 MG tablet [id number] Yes Take 1 tablet (500 mg total) by mouth every 6 (six) hours as needed for muscle spasms. Lonia Blood, MD Taking Active   metoprolol tartrate (LOPRESSOR) 100 MG tablet [id number] Yes TAKE 1 & 1/2 TABS (150MG ) BY MOUTH 2 (TWO) TIMES DAILY.  Patient taking differently: Take 100 mg by mouth 2 (two) times daily.   Corky Crafts, MD Taking Active Child, Pharmacy Records  nitroGLYCERIN (NITROSTAT) 0.4 MG SL tablet [id number] Yes Place 1 tablet (0.4 mg total) under the tongue every 5 (five) minutes as needed. Arty Baumgartner, NP Taking Active Child, Pharmacy Records  oxyCODONE (OXY IR/ROXICODONE) 5 MG immediate release tablet [id number] Yes Take 1 tablet (5 mg total) by mouth every 6 (six) hours as needed for severe pain. Lonia Blood, MD Taking Active   pantoprazole (PROTONIX) 40 MG tablet [id number] Yes Take 1 tablet (40 mg total) by mouth daily. Corky Crafts, MD Taking Active Child, Pharmacy Records    Discontinued 01/17/19 [phone] (Discontinued by provider) Rivaroxaban (XARELTO) 15 MG TABS tablet [id number] Yes Take 1 tablet (15 mg total) by mouth daily with supper. Corky Crafts, MD Taking Active Child, Pharmacy Records  torsemide [facility]) 20 MG tablet [id number] Yes Take 2 tablets (40 mg) PO daily Lonia Blood, MD Taking Active   TOVIAZ 4 MG TB24 tablet [id number]  Yes Take 4 mg by mouth daily. [provider] Taking Active Child, Pharmacy Records  traZODone (DESYREL) 50 MG tablet [id number] Yes Take 1 tablet (50 mg total) by  mouth at bedtime as needed for sleep. [name], DO Taking Active Child, Pharmacy Records  vitamin B-12 (CYANOCOBALAMIN) 1000 MCG tablet [id number] Yes Take 1,000 mcg by mouth every other day.  [provider] Taking Active Child, Pharmacy Records            Home Care and Equipment/Supplies: Were Home Health Services Ordered?: NA Any new equipment or medical supplies ordered?: NA  Functional Questionnaire: Do you need assistance with bathing/showering or dressing?: No Do you need assistance with meal preparation?: No Do you need assistance with eating?: No Do you have difficulty maintaining continence: No Do you need assistance with getting out of bed/getting out of a chair/moving?: No Do you have difficulty managing or taking your medications?: No  Follow up appointments reviewed: PCP Follow-up appointment confirmed?: Yes Date of PCP follow-up appointment?: 08/03/22 Follow-up Provider: Dr [facility] Follow-up appointment confirmed?: NA Do you need transportation to your follow-up appointment?: No Do you understand care options if your condition(s) worsen?: Yes-patient verbalized understanding    SIGNATURE Karena Addison, LPN [facility] Nurse Health Advisor Direct Dial [phone]

## 2022-08-03 ENCOUNTER — Encounter: Payer: Self-pay | Admitting: Family Medicine

## 2022-08-03 ENCOUNTER — Ambulatory Visit (INDEPENDENT_AMBULATORY_CARE_PROVIDER_SITE_OTHER): Payer: Medicare HMO | Admitting: Family Medicine

## 2022-08-03 VITALS — BP 118/78 | HR 69 | Temp 98.0°F | Ht 60.0 in | Wt 200.1 lb

## 2022-08-03 DIAGNOSIS — T50905A Adverse effect of unspecified drugs, medicaments and biological substances, initial encounter: Secondary | ICD-10-CM | POA: Diagnosis not present

## 2022-08-03 DIAGNOSIS — Z7984 Long term (current) use of oral hypoglycemic drugs: Secondary | ICD-10-CM | POA: Diagnosis not present

## 2022-08-03 DIAGNOSIS — E1165 Type 2 diabetes mellitus with hyperglycemia: Secondary | ICD-10-CM | POA: Diagnosis not present

## 2022-08-03 NOTE — Patient Instructions (Signed)
Send me a message in 1 week wit your sugar readings na we can decide what to do from there.  Let us know if you need anything.

## 2022-08-03 NOTE — Progress Notes (Signed)
Chief Complaint  Patient presents with   Hospitalization Follow-up    Subjective: Patient is a 77 y.o. female here for hosp f/u.  Here with her son.  Patient was admitted to the hospital on 07/27/2022 discharged 07/30/2022.  She was diagnosed with a left lower lobe pneumonia and gastroenteritis versus adverse reaction to her Trulicity.  I had seen her and started treating her for UTI.  At that time we decided to go back on Trulicity as she was questioning whether or not it had truly given her any bowel issues.  She did not fill what I gave her and used leftover medicine but it was at a higher dosage.  She thinks this could be why she had the adverse effect.  Her bowels are back to normal and she only has mild pain which is still improving.  She is breathing well.  She is finished her antibiotics.  Her sugars been running in the mid 100s.  She was started on glipizide 5 mg daily.  No episodes of hypoglycemia.  She is compliant with her Farxiga 10 mg daily.  No adverse effects with this.  Past Medical History:  Diagnosis Date   A-fib (HCC)    Abnormal Pap smear of vagina and vaginal HPV    Acute diastolic CHF (congestive heart failure) (HCC) 01/21/2015   Acute diverticulitis 01/28/2015   Acute on chronic diastolic heart failure (HCC) 01/21/2015   Anticoagulation adequate 08/02/2014   Overview:  Xarelto 20hs; CHADS=1+ htn, female, age  Overview:  Xarelto 20hs; CHADS=1+ htn, female, age   Arthritis    "bones ache" (01/23/2015)   Atrial fibrillation, persistent (HCC) 08/13/2015   Overview:  symptomatic (fatigue, DOE, decreased exercise tolerance, palpitations), drug refractory (propafenone, amiodarone), complicated recovery from DCCV possibly related to repiratory obstruction [facility] (patient fear of DCCV)  Overview:  symptomatic (fatigue, DOE, decreased exercise tolerance, palpitations), drug refractory (propafenone, amiodarone), complicated recovery from DCCV possibly relat   Benign neoplasm of  ascending colon    Benign neoplasm of descending colon    Benign neoplasm of sigmoid colon    Blood in stool 01/21/2015   Breath shortness 08/20/2013   CAFL (chronic airflow limitation) (HCC) 12/16/2014   Cancer (HCC)    vulvar cancer   CCF (congestive cardiac failure) (HCC) 08/03/2013   Overview:  ACUTE SYSTOLIC    Chest pain 08/20/2013   CHF (congestive heart failure) (HCC)    CHF (congestive heart failure) (HCC)    Chronic anticoagulation    She is on Xarelto for Afib   Chronic atrial fibrillation (HCC)    Chronic combined systolic and diastolic CHF, NYHA class 3 (HCC) 01/28/2015   Chronic diastolic heart failure (HCC) 05/05/2015   Condyloma acuminatum of vulva 08/02/2014   COPD (chronic obstructive pulmonary disease) (HCC) 08/13/2015   Diverticulitis large intestine w/o perforation or abscess w/bleeding 01/28/2015   Diverticulitis of large intestine without perforation or abscess with bleeding    Dysmetabolic syndrome 12/16/2014   Overview:  BG-166 06/15/2013; elevated BMI; central obesity    Dyspnea    Echocardiogram abnormal    Mitral regurgitation, tricuspic regurgitation   Essential hypertension 10/07/2014   Trial off acei 10/07/2014  Due to atypical sob (talking = walking)> no real change 11/18/2014     Family history of adverse reaction to anesthesia    sister gets very nauseated and vomits   Gastritis without bleeding    GERD (gastroesophageal reflux disease)    History of kidney stones    History of stress  test    Myoview 8/16: EF 42%, anteroseptal, inferoseptal, anterior, apical anterior and apical defect (likely represents breast attenuation versus scar), no ischemia; intermediate risk because of low EF   HPV (human papilloma virus) anogenital infection    03/21/12 PAP + HR HPV   HTN (hypertension) 12/16/2014   Hypertension    Hypertensive heart disease 05/12/2016   Hypertensive urgency 01/21/2015   Insulin resistance    Kidney stones    Malignant neoplasm of  vulva (HCC) 06/19/2014   Overview:  Ms MILLIANI JORY is a 77 yo who was referred by Dr Feliberto Gottron for a vulvar lesion. 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 The specimen had negative margins, but was within 2 mm of the cancer.     Mitral regurgitation 04/23/2013   Overview:  "Moderate to severe" by TTE, "mild to mod" by TEE  Overview:  "Moderate to severe" by TTE, "mild to mod" by TEE   Obesity    OSA (obstructive sleep apnea) 11/27/2014   07/2013 RDI 17/h CPAP 8    Other specified diseases of esophagus    Postoperative nausea and vomiting 1983   many years ago with hysterectomy   PVC (premature ventricular contraction) 12/16/2014   TI (tricuspid incompetence) 04/23/2013   Overview:  "moderate" by TTE    Urolithiasis    Vertigo    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 History of genital warts and vulvar condyloma   Vulvar cancer, carcinoma (HCC) 05/10/2014    Objective: BP 118/78 (BP Location: Left Arm, Patient Position: Sitting, Cuff Size: Normal)   Pulse 69   Temp 98 F (36.7 C) (Oral)   Ht 5' (1.524 m)   Wt 200 lb 2 oz (90.8 kg)   SpO2 96%   BMI 39.08 kg/m  General: Awake, appears stated age Heart: RRR, no LE edema Lungs: CTAB, no rales, wheezes or rhonchi. No accessory muscle use Psych: Age appropriate judgment and insight, normal affect and mood  Assessment and Plan: Type 2 diabetes mellitus with hyperglycemia, without long-term current use of insulin (HCC)  Adverse effect of drug, initial encounter  Stay on glipizide 5 mg daily, Farxiga 10 mg daily.  She will send a message in 1 week with her sugar readings.  May keep things the same and increase to 5 mg twice daily of the glipizide.  Counseled on diet and exercise. For now we will stay away from Trulicity even though she was noncompliant. She is recovering well from her hospitalization. The patient  and her son voiced understanding and agreement to the plan.  I spent 35 minutes with the patient discussing the above plans in addition to reviewing her chart on the same day of the visit.  [name] [place], DO 08/03/22  4:43 PM

## 2022-08-06 ENCOUNTER — Ambulatory Visit: Payer: Medicare HMO | Admitting: Family Medicine

## 2022-08-11 ENCOUNTER — Encounter: Payer: Self-pay | Admitting: Family Medicine

## 2022-08-12 ENCOUNTER — Other Ambulatory Visit: Payer: Self-pay | Admitting: Family Medicine

## 2022-08-12 MED ORDER — GLIPIZIDE 5 MG PO TABS: 5.0000 mg | ORAL_TABLET | Freq: Two times a day (BID) | ORAL | 2 refills | Status: DC

## 2022-08-17 ENCOUNTER — Ambulatory Visit (INDEPENDENT_AMBULATORY_CARE_PROVIDER_SITE_OTHER): Payer: Medicare HMO | Admitting: Family Medicine

## 2022-08-17 ENCOUNTER — Encounter: Payer: Self-pay | Admitting: Family Medicine

## 2022-08-17 VITALS — BP 130/85 | HR 48 | Temp 98.0°F | Ht 60.0 in | Wt 198.1 lb

## 2022-08-17 DIAGNOSIS — R300 Dysuria: Secondary | ICD-10-CM | POA: Diagnosis not present

## 2022-08-17 LAB — POC URINALSYSI DIPSTICK (AUTOMATED)
Bilirubin, UA: NEGATIVE
Blood, UA: NEGATIVE
Glucose, UA: POSITIVE — AB
Ketones, UA: NEGATIVE
Nitrite, UA: NEGATIVE
Protein, UA: NEGATIVE
Spec Grav, UA: 1.01 (ref 1.010–1.025)
Urobilinogen, UA: 0.2 E.U./dL
pH, UA: 5 (ref 5.0–8.0)

## 2022-08-17 MED ORDER — FLUCONAZOLE 150 MG PO TABS: ORAL_TABLET | ORAL | 0 refills | Status: DC

## 2022-08-17 MED ORDER — NITROFURANTOIN MONOHYD MACRO 100 MG PO CAPS
100.0000 mg | ORAL_CAPSULE | Freq: Two times a day (BID) | ORAL | 0 refills | Status: DC
Start: 2022-08-17 — End: 2022-08-20

## 2022-08-17 NOTE — Patient Instructions (Signed)
Stay hydrated.   Warning signs/symptoms: Uncontrollable nausea/vomiting, fevers, worsening symptoms despite treatment, confusion.  Give us around 2 business days to get culture back to you.  Let us know if you need anything. 

## 2022-08-17 NOTE — Progress Notes (Signed)
Chief Complaint  Patient presents with   Dysuria    Holly Smith is a 77 y.o. female here for possible UTI.  Duration: 3 days. Symptoms: Dysuria, urinary frequency and urgency Denies: hematuria, urinary hesitancy, urinary retention, fever, nausea, vomiting, and vaginal discharge Hx of recurrent UTI? No Denies new sexual partners.  Past Medical History:  Diagnosis Date   A-fib (HCC)    Abnormal Pap smear of vagina and vaginal HPV    Acute diastolic CHF (congestive heart failure) (HCC) 01/21/2015   Acute diverticulitis 01/28/2015   Acute on chronic diastolic heart failure (HCC) 01/21/2015   Anticoagulation adequate 08/02/2014   Overview:  Xarelto 20hs; CHADS=1+ htn, female, age  Overview:  Xarelto 20hs; CHADS=1+ htn, female, age   Arthritis    "bones ache" (01/23/2015)   Atrial fibrillation, persistent (HCC) 08/13/2015   Overview:  symptomatic (fatigue, DOE, decreased exercise tolerance, palpitations), drug refractory (propafenone, amiodarone), complicated recovery from DCCV possibly related to repiratory obstruction [facility] (patient fear of DCCV)  Overview:  symptomatic (fatigue, DOE, decreased exercise tolerance, palpitations), drug refractory (propafenone, amiodarone), complicated recovery from DCCV possibly relat   Benign neoplasm of ascending colon    Benign neoplasm of descending colon    Benign neoplasm of sigmoid colon    Blood in stool 01/21/2015   Breath shortness 08/20/2013   CAFL (chronic airflow limitation) (HCC) 12/16/2014   Cancer (HCC)    vulvar cancer   CCF (congestive cardiac failure) (HCC) 08/03/2013   Overview:  ACUTE SYSTOLIC    Chest pain 08/20/2013   CHF (congestive heart failure) (HCC)    CHF (congestive heart failure) (HCC)    Chronic anticoagulation    She is on Xarelto for Afib   Chronic atrial fibrillation (HCC)    Chronic combined systolic and diastolic CHF, NYHA class 3 (HCC) 01/28/2015   Chronic diastolic heart failure (HCC) 05/05/2015    Condyloma acuminatum of vulva 08/02/2014   COPD (chronic obstructive pulmonary disease) (HCC) 08/13/2015   Diverticulitis large intestine w/o perforation or abscess w/bleeding 01/28/2015   Diverticulitis of large intestine without perforation or abscess with bleeding    Dysmetabolic syndrome 12/16/2014   Overview:  BG-166 06/15/2013; elevated BMI; central obesity    Dyspnea    Echocardiogram abnormal    Mitral regurgitation, tricuspic regurgitation   Essential hypertension 10/07/2014   Trial off acei 10/07/2014  Due to atypical sob (talking = walking)> no real change 11/18/2014     Family history of adverse reaction to anesthesia    sister gets very nauseated and vomits   Gastritis without bleeding    GERD (gastroesophageal reflux disease)    History of kidney stones    History of stress test    Myoview 8/16: EF 42%, anteroseptal, inferoseptal, anterior, apical anterior and apical defect (likely represents breast attenuation versus scar), no ischemia; intermediate risk because of low EF   HPV (human papilloma virus) anogenital infection    03/21/12 PAP + HR HPV   HTN (hypertension) 12/16/2014   Hypertension    Hypertensive heart disease 05/12/2016   Hypertensive urgency 01/21/2015   Insulin resistance    Kidney stones    Malignant neoplasm of vulva (HCC) 06/19/2014   Overview:  Ms SHADAY KNICELEY is a 77 yo who was referred by Dr Feliberto Gottron for a vulvar lesion. 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 The specimen had negative margins, but was within 2 mm of the cancer.  Mitral regurgitation 04/23/2013   Overview:  "Moderate to severe" by TTE, "mild to mod" by TEE  Overview:  "Moderate to severe" by TTE, "mild to mod" by TEE   Obesity    OSA (obstructive sleep apnea) 11/27/2014   07/2013 RDI 17/h CPAP 8    Other specified diseases of esophagus    Postoperative nausea and vomiting 1983   many years ago  with hysterectomy   PVC (premature ventricular contraction) 12/16/2014   TI (tricuspid incompetence) 04/23/2013   Overview:  "moderate" by TTE    Urolithiasis    Vertigo    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 History of genital warts and vulvar condyloma   Vulvar cancer, carcinoma (HCC) 05/10/2014     BP 130/85 (BP Location: Left Arm, Patient Position: Sitting, Cuff Size: Normal)   Pulse (!) 48   Temp 98 F (36.7 C) (Oral)   Ht 5' (1.524 m)   Wt 198 lb 2 oz (89.9 kg)   SpO2 98%   BMI 38.69 kg/m  General: Awake, alert, appears stated age Heart: reg rhythm, bradycardic Lungs: CTAB, normal respiratory effort, no accessory muscle usage Abd: BS+, soft, TTP in epigastric region, ND, no masses or organomegaly MSK: No CVA tenderness, neg Lloyd's sign Psych: Age appropriate judgment and insight  Dysuria - Plan: POCT Urinalysis Dipstick (Automated), Urine Culture, nitrofurantoin, macrocrystal-monohydrate, (MACROBID) 100 MG capsule  7 days of Macrobid 100 mg twice daily, renal function is stage IIIa.  Stay hydrated. Seek immediate care if pt starts to develop fevers, new/worsening symptoms, uncontrollable N/V. F/u prn. The patient voiced understanding and agreement to the plan.  Holly Roche Doyle, DO 08/17/22 4:19 PM

## 2022-08-18 ENCOUNTER — Other Ambulatory Visit: Payer: Self-pay

## 2022-08-18 ENCOUNTER — Emergency Department (HOSPITAL_COMMUNITY): Payer: Medicare HMO

## 2022-08-18 ENCOUNTER — Observation Stay (HOSPITAL_COMMUNITY)
Admission: EM | Admit: 2022-08-18 | Discharge: 2022-08-20 | Disposition: A | Discharge status: Home / Self Care | Payer: Medicare HMO | Attending: Internal Medicine | Admitting: Internal Medicine

## 2022-08-18 DIAGNOSIS — R197 Diarrhea, unspecified: Secondary | ICD-10-CM | POA: Insufficient documentation

## 2022-08-18 DIAGNOSIS — E1122 Type 2 diabetes mellitus with diabetic chronic kidney disease: Secondary | ICD-10-CM | POA: Insufficient documentation

## 2022-08-18 DIAGNOSIS — I5042 Chronic combined systolic (congestive) and diastolic (congestive) heart failure: Secondary | ICD-10-CM | POA: Insufficient documentation

## 2022-08-18 DIAGNOSIS — J449 Chronic obstructive pulmonary disease, unspecified: Secondary | ICD-10-CM | POA: Diagnosis present

## 2022-08-18 DIAGNOSIS — N1831 Chronic kidney disease, stage 3a: Secondary | ICD-10-CM | POA: Insufficient documentation

## 2022-08-18 DIAGNOSIS — I2510 Atherosclerotic heart disease of native coronary artery without angina pectoris: Secondary | ICD-10-CM | POA: Diagnosis not present

## 2022-08-18 DIAGNOSIS — E860 Dehydration: Secondary | ICD-10-CM | POA: Insufficient documentation

## 2022-08-18 DIAGNOSIS — Z87891 Personal history of nicotine dependence: Secondary | ICD-10-CM | POA: Insufficient documentation

## 2022-08-18 DIAGNOSIS — K5730 Diverticulosis of large intestine without perforation or abscess without bleeding: Secondary | ICD-10-CM | POA: Diagnosis not present

## 2022-08-18 DIAGNOSIS — G4733 Obstructive sleep apnea (adult) (pediatric): Secondary | ICD-10-CM

## 2022-08-18 DIAGNOSIS — E1165 Type 2 diabetes mellitus with hyperglycemia: Secondary | ICD-10-CM

## 2022-08-18 DIAGNOSIS — I130 Hypertensive heart and chronic kidney disease with heart failure and stage 1 through stage 4 chronic kidney disease, or unspecified chronic kidney disease: Secondary | ICD-10-CM | POA: Insufficient documentation

## 2022-08-18 DIAGNOSIS — J810 Acute pulmonary edema: Secondary | ICD-10-CM | POA: Diagnosis not present

## 2022-08-18 DIAGNOSIS — Z79899 Other long term (current) drug therapy: Secondary | ICD-10-CM | POA: Insufficient documentation

## 2022-08-18 DIAGNOSIS — R000 Tachycardia, unspecified: Secondary | ICD-10-CM | POA: Diagnosis not present

## 2022-08-18 DIAGNOSIS — I4891 Unspecified atrial fibrillation: Secondary | ICD-10-CM | POA: Diagnosis not present

## 2022-08-18 DIAGNOSIS — R0602 Shortness of breath: Secondary | ICD-10-CM | POA: Diagnosis not present

## 2022-08-18 DIAGNOSIS — R112 Nausea with vomiting, unspecified: Secondary | ICD-10-CM | POA: Diagnosis present

## 2022-08-18 DIAGNOSIS — N179 Acute kidney failure, unspecified: Secondary | ICD-10-CM | POA: Diagnosis not present

## 2022-08-18 DIAGNOSIS — Z743 Need for continuous supervision: Secondary | ICD-10-CM | POA: Diagnosis not present

## 2022-08-18 DIAGNOSIS — I4821 Permanent atrial fibrillation: Secondary | ICD-10-CM | POA: Diagnosis not present

## 2022-08-18 DIAGNOSIS — R1084 Generalized abdominal pain: Secondary | ICD-10-CM | POA: Diagnosis not present

## 2022-08-18 DIAGNOSIS — Z8544 Personal history of malignant neoplasm of other female genital organs: Secondary | ICD-10-CM | POA: Insufficient documentation

## 2022-08-18 DIAGNOSIS — I10 Essential (primary) hypertension: Secondary | ICD-10-CM | POA: Diagnosis present

## 2022-08-18 DIAGNOSIS — I5032 Chronic diastolic (congestive) heart failure: Secondary | ICD-10-CM | POA: Diagnosis present

## 2022-08-18 DIAGNOSIS — R0902 Hypoxemia: Secondary | ICD-10-CM | POA: Diagnosis not present

## 2022-08-18 DIAGNOSIS — E119 Type 2 diabetes mellitus without complications: Secondary | ICD-10-CM

## 2022-08-18 DIAGNOSIS — N200 Calculus of kidney: Secondary | ICD-10-CM | POA: Diagnosis not present

## 2022-08-18 DIAGNOSIS — R0689 Other abnormalities of breathing: Secondary | ICD-10-CM | POA: Diagnosis not present

## 2022-08-18 LAB — CBC WITH DIFFERENTIAL/PLATELET
Abs Immature Granulocytes: 0.06 10*3/uL (ref 0.00–0.07)
Basophils Absolute: 0 10*3/uL (ref 0.0–0.1)
Basophils Relative: 0 %
Eosinophils Absolute: 0.1 10*3/uL (ref 0.0–0.5)
Eosinophils Relative: 1 %
HCT: 47.9 % — ABNORMAL HIGH (ref 36.0–46.0)
Hemoglobin: 15.1 g/dL — ABNORMAL HIGH (ref 12.0–15.0)
Immature Granulocytes: 0 %
Lymphocytes Relative: 11 %
Lymphs Abs: 1.7 10*3/uL (ref 0.7–4.0)
MCH: 30.8 pg (ref 26.0–34.0)
MCHC: 31.5 g/dL (ref 30.0–36.0)
MCV: 97.8 fL (ref 80.0–100.0)
Monocytes Absolute: 0.7 10*3/uL (ref 0.1–1.0)
Monocytes Relative: 5 %
Neutro Abs: 12.8 10*3/uL — ABNORMAL HIGH (ref 1.7–7.7)
Neutrophils Relative %: 83 %
Platelets: 243 10*3/uL (ref 150–400)
RBC: 4.9 MIL/uL (ref 3.87–5.11)
RDW: 13.9 % (ref 11.5–15.5)
WBC: 15.4 10*3/uL — ABNORMAL HIGH (ref 4.0–10.5)
nRBC: 0 % (ref 0.0–0.2)

## 2022-08-18 LAB — URINE CULTURE
MICRO NUMBER:: 15008550
SPECIMEN QUALITY:: ADEQUATE

## 2022-08-18 LAB — BRAIN NATRIURETIC PEPTIDE: B Natriuretic Peptide: 125.1 pg/mL — ABNORMAL HIGH (ref 0.0–100.0)

## 2022-08-18 LAB — BASIC METABOLIC PANEL
Anion gap: 14 (ref 5–15)
BUN: 22 mg/dL (ref 8–23)
CO2: 27 mmol/L (ref 22–32)
Calcium: 9.4 mg/dL (ref 8.9–10.3)
Chloride: 98 mmol/L (ref 98–111)
Creatinine, Ser: 1.51 mg/dL — ABNORMAL HIGH (ref 0.44–1.00)
GFR, Estimated: 35 mL/min — ABNORMAL LOW (ref >60)
Glucose, Bld: 211 mg/dL — ABNORMAL HIGH (ref 70–99)
Potassium: 4 mmol/L (ref 3.5–5.1)
Sodium: 139 mmol/L (ref 135–145)

## 2022-08-18 LAB — TROPONIN I (HIGH SENSITIVITY): Troponin I (High Sensitivity): 13 ng/L (ref <18)

## 2022-08-18 MED ORDER — METOPROLOL TARTRATE 25 MG PO TABS
150.0000 mg | ORAL_TABLET | Freq: Once | ORAL | Status: AC
  Administered 2022-08-18: 150 mg via ORAL
  Filled 2022-08-18: qty 6

## 2022-08-18 MED ORDER — RIVAROXABAN 15 MG PO TABS: 15.0000 mg | ORAL_TABLET | Freq: Every day | ORAL | Status: DC

## 2022-08-18 NOTE — ED Provider Notes (Signed)
EMERGENCY DEPARTMENT AT [facility] Provider Note   CSN: [id number] Arrival date & time: 08/18/22  2125     History  Chief Complaint  Patient presents with   Shortness of Breath   Emesis    Holly Smith is a 77 y.o. female.  Pt is a 77 yo female with pmhx significant for HTN, chronic afib on Xarelto, CHF, GERD, vulvar cancer, sleep apnea, diverticulitis, and frequent UTIs.  Pt was admitted from 5/7-5/10 for LLL pna.  She was on keflex, then macrobid recently for uti, so she was d/c on augmentin.  Pt developed sob tonight.  She did feel like her heart was beating fast, but did not take her evening metoprolol because she thought she needed to be seen in the ED.  She called EMS who put her on 2L for comfort.  Pt said she saw her pcp yesterday and was put on Macrobid for a UTI.  She thinks she's had an allergy to this in the past.  EMS also gave pt 4 mg zofran iv and a duoneb en route.  She is feeling better, but HR is still elevated.  Pt also reports severe diarrhea.       Home Medications Prior to Admission medications   Medication Sig Start Date End Date Taking? Authorizing Provider  BIOTIN PO Take 1 tablet by mouth daily.     [provider]  Cholecalciferol (VITAMIN D3) 1000 units CAPS Take by mouth.    [provider]  diclofenac Sodium (VOLTAREN) 1 % GEL Apply topically. 12/19/20   [provider]  FARXIGA 10 MG TABS tablet Take 10 mg by mouth daily. 12/25/20   [provider]  fluconazole (DIFLUCAN) 150 MG tablet Take 1 tab, repeat in 72 hours if no improvement. 08/17/22   [name], DO  fluticasone (FLONASE) 50 MCG/ACT nasal spray Place 2 sprays into both nostrils daily. 12/25/19   Daphine Deutscher, Mary-Margaret, FNP  glipiZIDE (GLUCOTROL) 5 MG tablet Take 1 tablet (5 mg total) by mouth 2 (two) times daily before a meal. 08/12/22   Wendling, [name], DO  irbesartan (AVAPRO) 75 MG tablet Take 1 tablet (75 mg  total) by mouth daily. 08/02/22   Lonia Blood, MD  methocarbamol (ROBAXIN) 500 MG tablet Take 1 tablet (500 mg total) by mouth every 6 (six) hours as needed for muscle spasms. 07/30/22   Lonia Blood, MD  metoprolol tartrate (LOPRESSOR) 100 MG tablet TAKE 1 & 1/2 TABS (150MG ) BY MOUTH 2 (TWO) TIMES DAILY. Patient taking differently: Take 100 mg by mouth 2 (two) times daily. 04/27/22   Corky Crafts, MD  nitrofurantoin, macrocrystal-monohydrate, (MACROBID) 100 MG capsule Take 1 capsule (100 mg total) by mouth 2 (two) times daily for 7 days. 08/17/22 08/24/22  [name], DO  nitroGLYCERIN (NITROSTAT) 0.4 MG SL tablet Place 1 tablet (0.4 mg total) under the tongue every 5 (five) minutes as needed. 01/18/19   Arty Baumgartner, NP  oxyCODONE (OXY IR/ROXICODONE) 5 MG immediate release tablet Take 1 tablet (5 mg total) by mouth every 6 (six) hours as needed for severe pain. 07/30/22   Lonia Blood, MD  pantoprazole (PROTONIX) 40 MG tablet Take 1 tablet (40 mg total) by mouth daily. 03/16/22   Corky Crafts, MD  Rivaroxaban (XARELTO) 15 MG TABS tablet Take 1 tablet (15 mg total) by mouth daily with supper. 10/26/21   Corky Crafts, MD  torsemide (DEMADEX) 20 MG tablet Take  2 tablets (40 mg) PO daily 07/31/22   Lonia Blood, MD  TOVIAZ 4 MG TB24 tablet Take 4 mg by mouth daily. 02/19/21   [provider]  traZODone (DESYREL) 50 MG tablet Take 1 tablet (50 mg total) by mouth at bedtime as needed for sleep. 05/25/22   [name], DO  vitamin B-12 (CYANOCOBALAMIN) 1000 MCG tablet Take 1,000 mcg by mouth every other day.     [provider]  potassium chloride SA (K-DUR,KLOR-CON) 20 MEQ tablet Take 20 mEq by mouth daily.  07/30/19  [provider]      Allergies    Ciprofloxacin    Review of Systems   Review of Systems  Respiratory:  Positive for shortness of breath.   Cardiovascular:  Positive for palpitations.   Gastrointestinal:  Positive for nausea.  All other systems reviewed and are negative.   Physical Exam Updated Vital Signs BP 92/63   Pulse (!) 131   Temp 98.1 F (36.7 C)   Resp (!) 33   SpO2 90%  Physical Exam Vitals and nursing note reviewed.  Constitutional:      Appearance: She is well-developed. She is obese.  HENT:     Head: Normocephalic and atraumatic.     Mouth/Throat:     Mouth: Mucous membranes are moist.     Pharynx: Oropharynx is clear.  Eyes:     Extraocular Movements: Extraocular movements intact.     Pupils: Pupils are equal, round, and reactive to light.  Cardiovascular:     Rate and Rhythm: Tachycardia present. Rhythm irregular.  Pulmonary:     Effort: Pulmonary effort is normal.     Breath sounds: Normal breath sounds.  Abdominal:     General: Bowel sounds are normal.     Palpations: Abdomen is soft.  Musculoskeletal:        General: Normal range of motion.     Cervical back: Normal range of motion and neck supple.  Skin:    General: Skin is warm.     Capillary Refill: Capillary refill takes less than 2 seconds.  Neurological:     General: No focal deficit present.     Mental Status: She is alert and oriented to person, place, and time.  Psychiatric:        Mood and Affect: Mood normal.        Behavior: Behavior normal.     ED Results / Procedures / Treatments   Labs (all labs ordered are listed, but only abnormal results are displayed) Labs Reviewed  BASIC METABOLIC PANEL - Abnormal; Notable for the following components:      Result Value   Glucose, Bld 211 (*)    Creatinine, Ser 1.51 (*)    GFR, Estimated 35 (*)    All other components within normal limits  CBC WITH DIFFERENTIAL/PLATELET - Abnormal; Notable for the following components:   WBC 15.4 (*)    Hemoglobin 15.1 (*)    HCT 47.9 (*)    Neutro Abs 12.8 (*)    All other components within normal limits  BRAIN NATRIURETIC PEPTIDE - Abnormal; Notable for the following components:    B Natriuretic Peptide 125.1 (*)    All other components within normal limits  C DIFFICILE QUICK SCREEN W PCR REFLEX    URINALYSIS, ROUTINE W REFLEX MICROSCOPIC  TROPONIN I (HIGH SENSITIVITY)  TROPONIN I (HIGH SENSITIVITY)    EKG EKG Interpretation  Date/Time:  Wednesday Aug 18 2022 21:29:01 EDT Ventricular Rate:  130 PR Interval:  96 QRS Duration: 95 QT Interval:  322 QTC Calculation: 474 R Axis:   47 Text Interpretation: afib with rvr Multiple premature complexes, vent & supraven RSR' in V1 or V2, right VCD or RVH Borderline ST depression, lateral leads Confirmed by Jacalyn Lefevre [phone]) on 08/18/2022 10:12:03 PM  Radiology DG Chest Port 1 View  Result Date: 08/18/2022 CLINICAL DATA:  Shortness of breath. EXAM: PORTABLE CHEST 1 VIEW COMPARISON:  Jul 27, 2022 FINDINGS: The study is limited secondary to patient rotation. Cardiac silhouette is mildly enlarged and unchanged in size. There is marked severity calcification of the thoracic aorta. Mild, diffuse the increased interstitial lung markings are noted. There is no evidence of focal consolidation, pleural effusion or pneumothorax. Multilevel degenerative changes seen throughout the thoracic spine. IMPRESSION: Cardiomegaly with mild interstitial edema. Electronically Signed   By: Aram Candela M.D.   On: 08/18/2022 21:52    Procedures Procedures    Medications Ordered in ED Medications  Rivaroxaban (XARELTO) tablet 15 mg (has no administration in time range)  metoprolol tartrate (LOPRESSOR) tablet 150 mg (150 mg Oral Given 08/18/22 2149)    ED Course/ Medical Decision Making/ A&P                             Medical Decision Making Amount and/or Complexity of Data Reviewed Labs: ordered. Radiology: ordered.  Risk Prescription drug management.   This patient presents to the ED for concern of sob, this involves an extensive number of treatment options, and is a complaint that carries with it a high risk of  complications and morbidity.  The differential diagnosis includes pna, chf, copd, afib   Co morbidities that complicate the patient evaluation  HTN, chronic afib on Xarelto, CHF, GERD, vulvar cancer, sleep apnea, diverticulitis, and frequent UTIs   Additional history obtained:  Additional history obtained from epic chart review External records from outside source obtained and reviewed including EMS report   Lab Tests:  I Ordered, and personally interpreted labs.  The pertinent results include:  cbc with wbc elevated at 15.4, bmp with glucose elevated at 211, cr 1.51   Imaging Studies ordered:  I ordered imaging studies including cxr and ct abd/pelvis I independently visualized and interpreted imaging which showed CXR:  Cardiomegaly with mild interstitial edema.  I agree with the radiologist interpretation   Cardiac Monitoring:  The patient was maintained on a cardiac monitor.  I personally viewed and interpreted the cardiac monitored which showed an underlying rhythm of: afib with rvr   Medicines ordered and prescription drug management:  I ordered medication including metoprolol  for afib  Reevaluation of the patient after these medicines showed that the patient improved I have reviewed the patients home medicines and have made adjustments as needed   Test Considered:  cxr  Problem List / ED Course:  Diarrhea:  ct on 5/7 did show colitis/diverticulitis.  She has had worsening diarrhea and has been on several abx.  Repeat ct is pending. C diff pending. Sob:  improved with HR going down. ?UTI:  culture from yesterday neg for infection.     Reevaluation:  After the interventions noted above, I reevaluated the patient and found that they have :improved   Social Determinants of Health:  Lives at home   Dispostion: Pending at shift change        Final Clinical Impression(s) / ED Diagnoses Final diagnoses:  Dehydration  Atrial fibrillation with  rapid ventricular response (HCC)  Diarrhea, unspecified type    Rx / DC Orders ED Discharge Orders     None         Jacalyn Lefevre, MD 08/18/22 2358

## 2022-08-18 NOTE — ED Triage Notes (Signed)
Patient arrived with EMS from home reports SOB with nausea and emesis this evening , received 1 Duoneb treatment and Zofran 4 mg IV by EMS prior to arrival , patient also reported reaction to [facility] for UTI with occasional diarrhea . History of Afib .

## 2022-08-19 ENCOUNTER — Telehealth: Payer: Self-pay

## 2022-08-19 ENCOUNTER — Encounter (HOSPITAL_COMMUNITY): Payer: Self-pay | Admitting: Family Medicine

## 2022-08-19 ENCOUNTER — Emergency Department (HOSPITAL_COMMUNITY): Payer: Medicare HMO

## 2022-08-19 DIAGNOSIS — N1831 Chronic kidney disease, stage 3a: Secondary | ICD-10-CM

## 2022-08-19 DIAGNOSIS — K5730 Diverticulosis of large intestine without perforation or abscess without bleeding: Secondary | ICD-10-CM | POA: Diagnosis not present

## 2022-08-19 DIAGNOSIS — I10 Essential (primary) hypertension: Secondary | ICD-10-CM

## 2022-08-19 DIAGNOSIS — G4733 Obstructive sleep apnea (adult) (pediatric): Secondary | ICD-10-CM | POA: Diagnosis not present

## 2022-08-19 DIAGNOSIS — I2510 Atherosclerotic heart disease of native coronary artery without angina pectoris: Secondary | ICD-10-CM | POA: Diagnosis not present

## 2022-08-19 DIAGNOSIS — E119 Type 2 diabetes mellitus without complications: Secondary | ICD-10-CM | POA: Diagnosis not present

## 2022-08-19 DIAGNOSIS — R197 Diarrhea, unspecified: Secondary | ICD-10-CM

## 2022-08-19 DIAGNOSIS — R112 Nausea with vomiting, unspecified: Secondary | ICD-10-CM

## 2022-08-19 DIAGNOSIS — N179 Acute kidney failure, unspecified: Secondary | ICD-10-CM

## 2022-08-19 DIAGNOSIS — N200 Calculus of kidney: Secondary | ICD-10-CM | POA: Diagnosis not present

## 2022-08-19 DIAGNOSIS — I4891 Unspecified atrial fibrillation: Principal | ICD-10-CM

## 2022-08-19 LAB — BASIC METABOLIC PANEL
Anion gap: 9 (ref 5–15)
BUN: 23 mg/dL (ref 8–23)
CO2: 22 mmol/L (ref 22–32)
Calcium: 8.6 mg/dL — ABNORMAL LOW (ref 8.9–10.3)
Chloride: 103 mmol/L (ref 98–111)
Creatinine, Ser: 1.41 mg/dL — ABNORMAL HIGH (ref 0.44–1.00)
GFR, Estimated: 38 mL/min — ABNORMAL LOW (ref >60)
Glucose, Bld: 220 mg/dL — ABNORMAL HIGH (ref 70–99)
Potassium: 4.6 mmol/L (ref 3.5–5.1)
Sodium: 134 mmol/L — ABNORMAL LOW (ref 135–145)

## 2022-08-19 LAB — URINALYSIS, ROUTINE W REFLEX MICROSCOPIC
Bacteria, UA: NONE SEEN
Bilirubin Urine: NEGATIVE
Glucose, UA: >=500 mg/dL — AB
Hgb urine dipstick: NEGATIVE
Ketones, ur: NEGATIVE mg/dL
Nitrite: NEGATIVE
Protein, ur: NEGATIVE mg/dL
Specific Gravity, Urine: 1.013 (ref 1.005–1.030)
pH: 5 (ref 5.0–8.0)

## 2022-08-19 LAB — GLUCOSE, CAPILLARY
Glucose-Capillary: 200 mg/dL — ABNORMAL HIGH (ref 70–99)
Glucose-Capillary: 165 mg/dL — ABNORMAL HIGH (ref 70–99)
Glucose-Capillary: 137 mg/dL — ABNORMAL HIGH (ref 70–99)
Glucose-Capillary: 159 mg/dL — ABNORMAL HIGH (ref 70–99)

## 2022-08-19 LAB — TROPONIN I (HIGH SENSITIVITY): Troponin I (High Sensitivity): 14 ng/L (ref <18)

## 2022-08-19 LAB — CBC
HCT: 41.7 % (ref 36.0–46.0)
Hemoglobin: 13.4 g/dL (ref 12.0–15.0)
MCH: 31.6 pg (ref 26.0–34.0)
MCHC: 32.1 g/dL (ref 30.0–36.0)
MCV: 98.3 fL (ref 80.0–100.0)
Platelets: 213 10*3/uL (ref 150–400)
RBC: 4.24 MIL/uL (ref 3.87–5.11)
RDW: 14.1 % (ref 11.5–15.5)
WBC: 18.7 10*3/uL — ABNORMAL HIGH (ref 4.0–10.5)
nRBC: 0 % (ref 0.0–0.2)

## 2022-08-19 LAB — MAGNESIUM: Magnesium: 2.1 mg/dL (ref 1.7–2.4)

## 2022-08-19 MED ORDER — SODIUM CHLORIDE 0.9 % IV BOLUS
500.0000 mL | Freq: Once | INTRAVENOUS | Status: AC
  Administered 2022-08-19: 500 mL via INTRAVENOUS

## 2022-08-19 MED ORDER — GLIPIZIDE 5 MG PO TABS
5.0000 mg | ORAL_TABLET | Freq: Two times a day (BID) | ORAL | Status: DC
  Administered 2022-08-19 – 2022-08-20 (×3): 5 mg via ORAL
  Filled 2022-08-19 (×3): qty 1

## 2022-08-19 MED ORDER — PROCHLORPERAZINE EDISYLATE 10 MG/2ML IJ SOLN: 5.0000 mg | Freq: Four times a day (QID) | INTRAMUSCULAR | Status: DC | PRN

## 2022-08-19 MED ORDER — RIVAROXABAN 15 MG PO TABS
15.0000 mg | ORAL_TABLET | Freq: Every day | ORAL | Status: DC
  Administered 2022-08-19: 15 mg via ORAL
  Filled 2022-08-19: qty 1

## 2022-08-19 MED ORDER — ACETAMINOPHEN 650 MG RE SUPP: 650.0000 mg | Freq: Four times a day (QID) | RECTAL | Status: DC | PRN

## 2022-08-19 MED ORDER — METOPROLOL TARTRATE 100 MG PO TABS
100.0000 mg | ORAL_TABLET | Freq: Two times a day (BID) | ORAL | Status: DC
  Administered 2022-08-19 – 2022-08-20 (×3): 100 mg via ORAL
  Filled 2022-08-19 (×3): qty 1

## 2022-08-19 MED ORDER — OXYCODONE HCL 5 MG PO TABS: 5.0000 mg | ORAL_TABLET | Freq: Four times a day (QID) | ORAL | Status: DC | PRN

## 2022-08-19 MED ORDER — IOHEXOL 350 MG/ML SOLN
75.0000 mL | Freq: Once | INTRAVENOUS | Status: AC | PRN
  Administered 2022-08-19: 75 mL via INTRAVENOUS

## 2022-08-19 MED ORDER — TRAZODONE HCL 50 MG PO TABS
50.0000 mg | ORAL_TABLET | Freq: Every evening | ORAL | Status: DC | PRN
  Administered 2022-08-19 (×2): 50 mg via ORAL
  Filled 2022-08-19: qty 1

## 2022-08-19 MED ORDER — INSULIN ASPART 100 UNIT/ML IJ SOLN: 0.0000 [IU] | Freq: Three times a day (TID) | INTRAMUSCULAR | Status: DC

## 2022-08-19 MED ORDER — PANTOPRAZOLE SODIUM 40 MG PO TBEC
40.0000 mg | DELAYED_RELEASE_TABLET | Freq: Every day | ORAL | Status: DC
  Administered 2022-08-19 – 2022-08-20 (×2): 40 mg via ORAL
  Filled 2022-08-19 (×2): qty 1

## 2022-08-19 MED ORDER — SODIUM CHLORIDE 0.9% FLUSH
3.0000 mL | Freq: Two times a day (BID) | INTRAVENOUS | Status: DC
  Administered 2022-08-19 – 2022-08-20 (×3): 3 mL via INTRAVENOUS

## 2022-08-19 MED ORDER — INSULIN ASPART 100 UNIT/ML IJ SOLN: 2.0000 [IU] | Freq: Three times a day (TID) | INTRAMUSCULAR | Status: DC

## 2022-08-19 MED ORDER — METHOCARBAMOL 500 MG PO TABS: 500.0000 mg | ORAL_TABLET | Freq: Four times a day (QID) | ORAL | Status: DC | PRN

## 2022-08-19 MED ORDER — FESOTERODINE FUMARATE ER 4 MG PO TB24
4.0000 mg | ORAL_TABLET | Freq: Every day | ORAL | Status: DC
  Administered 2022-08-19 – 2022-08-20 (×2): 4 mg via ORAL
  Filled 2022-08-19 (×3): qty 1

## 2022-08-19 MED ORDER — INSULIN ASPART 100 UNIT/ML IJ SOLN: 0.0000 [IU] | Freq: Every day | INTRAMUSCULAR | Status: DC

## 2022-08-19 MED ORDER — ACETAMINOPHEN 325 MG PO TABS: 650.0000 mg | ORAL_TABLET | Freq: Four times a day (QID) | ORAL | Status: DC | PRN

## 2022-08-19 NOTE — Telephone Encounter (Signed)
Pt admitted

## 2022-08-19 NOTE — ED Notes (Signed)
ED TO INPATIENT HANDOFF REPORT  ED Nurse Name and Phone #: [phone]  S Name/Age/Gender Holly Smith 77 y.o. female Room/Bed: 031C/031C  Code Status   Code Status: Full Code  Home/SNF/Other Home Patient oriented to: self, place, time, and situation Is this baseline? Yes   Triage Complete: Triage complete  Chief Complaint Rapid atrial fibrillation [facility]) [I48.91]  Triage Note Patient arrived with EMS from home reports SOB with nausea and emesis this evening , received 1 Duoneb treatment and Zofran 4 mg IV by EMS prior to arrival , patient also reported reaction to [facility] for UTI with occasional diarrhea . History of Afib .    Allergies Allergies  Allergen Reactions   Ciprofloxacin Other (See Comments)    SOB    Level of Care/Admitting Diagnosis ED Disposition     ED Disposition  Admit   Condition  --   Comment  Hospital Area: [facility] [[id number]]  Level of Care: Telemetry Cardiac [[id number]]  May place patient in observation at [facility] or [facility] if equivalent level of care is available:: Yes  Covid Evaluation: Asymptomatic - no recent exposure (last 10 days) testing not required  Diagnosis: Rapid atrial fibrillation [facility]) [[id number]]  Admitting Physician: Briscoe Deutscher [[id number]]  Attending Physician: Briscoe Deutscher [[id number]]          B Medical/Surgery History Past Medical History:  Diagnosis Date   A-fib (HCC)    Abnormal Pap smear of vagina and vaginal HPV    Acute diastolic CHF (congestive heart failure) (HCC) 01/21/2015   Acute diverticulitis 01/28/2015   Acute on chronic diastolic heart failure (HCC) 01/21/2015   Anticoagulation adequate 08/02/2014   Overview:  Xarelto 20hs; CHADS=1+ htn, female, age  Overview:  Xarelto 20hs; CHADS=1+ htn, female, age   Arthritis    "bones ache" (01/23/2015)   Atrial fibrillation, persistent (HCC) 08/13/2015   Overview:  symptomatic (fatigue, DOE, decreased exercise tolerance, palpitations),  drug refractory (propafenone, amiodarone), complicated recovery from DCCV possibly related to repiratory obstruction [facility] (patient fear of DCCV)  Overview:  symptomatic (fatigue, DOE, decreased exercise tolerance, palpitations), drug refractory (propafenone, amiodarone), complicated recovery from DCCV possibly relat   Benign neoplasm of ascending colon    Benign neoplasm of descending colon    Benign neoplasm of sigmoid colon    Blood in stool 01/21/2015   Breath shortness 08/20/2013   CAFL (chronic airflow limitation) (HCC) 12/16/2014   Cancer (HCC)    vulvar cancer   CCF (congestive cardiac failure) (HCC) 08/03/2013   Overview:  ACUTE SYSTOLIC    Chest pain 08/20/2013   CHF (congestive heart failure) (HCC)    CHF (congestive heart failure) (HCC)    Chronic anticoagulation    She is on Xarelto for Afib   Chronic atrial fibrillation (HCC)    Chronic combined systolic and diastolic CHF, NYHA class 3 (HCC) 01/28/2015   Chronic diastolic heart failure (HCC) 05/05/2015   Condyloma acuminatum of vulva 08/02/2014   COPD (chronic obstructive pulmonary disease) (HCC) 08/13/2015   Diverticulitis large intestine w/o perforation or abscess w/bleeding 01/28/2015   Diverticulitis of large intestine without perforation or abscess with bleeding    Dysmetabolic syndrome 12/16/2014   Overview:  BG-166 06/15/2013; elevated BMI; central obesity    Dyspnea    Echocardiogram abnormal    Mitral regurgitation, tricuspic regurgitation   Essential hypertension 10/07/2014   Trial off acei 10/07/2014  Due to atypical sob (talking = walking)> no real change 11/18/2014  Family history of adverse reaction to anesthesia    sister gets very nauseated and vomits   Gastritis without bleeding    GERD (gastroesophageal reflux disease)    History of kidney stones    History of stress test    Myoview 8/16: EF 42%, anteroseptal, inferoseptal, anterior, apical anterior and apical defect (likely represents breast  attenuation versus scar), no ischemia; intermediate risk because of low EF   HPV (human papilloma virus) anogenital infection    03/21/12 PAP + HR HPV   HTN (hypertension) 12/16/2014   Hypertension    Hypertensive heart disease 05/12/2016   Hypertensive urgency 01/21/2015   Insulin resistance    Kidney stones    Malignant neoplasm of vulva (HCC) 06/19/2014   Overview:  Ms Holly Smith is a 77 yo who was referred by Dr Feliberto Gottron for a vulvar lesion. 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 The specimen had negative margins, but was within 2 mm of the cancer.     Mitral regurgitation 04/23/2013   Overview:  "Moderate to severe" by TTE, "mild to mod" by TEE  Overview:  "Moderate to severe" by TTE, "mild to mod" by TEE   Obesity    OSA (obstructive sleep apnea) 11/27/2014   07/2013 RDI 17/h CPAP 8    Other specified diseases of esophagus    Postoperative nausea and vomiting 1983   many years ago with hysterectomy   PVC (premature ventricular contraction) 12/16/2014   TI (tricuspid incompetence) 04/23/2013   Overview:  "moderate" by TTE    Urolithiasis    Vertigo    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 History of genital warts and vulvar condyloma   Vulvar cancer, carcinoma (HCC) 05/10/2014   Past Surgical History:  Procedure Laterality Date   ABDOMINAL HYSTERECTOMY     APPENDECTOMY     BILATERAL SALPINGOOPHORECTOMY  2003   benign ovarian cancer   CARDIOVERSION  2014-2015   "[facility]"   COLONOSCOPY WITH PROPOFOL N/A 06/10/2015   Procedure: COLONOSCOPY WITH PROPOFOL;  Surgeon: Midge Minium, MD;  Location: [facility] ENDOSCOPY;  Service: Endoscopy;  Laterality: N/A;   CORONARY STENT INTERVENTION N/A 01/18/2019   Procedure: CORONARY STENT INTERVENTION;  Surgeon: Corky Crafts, MD;  Location: [facility] INVASIVE CV LAB;  Service: Cardiovascular;  Laterality: N/A;    CYSTOSCOPY W/ RETROGRADES Bilateral 10/19/2016   Procedure: CYSTOSCOPY WITH RETROGRADE PYELOGRAM;  Surgeon: Vanna Scotland, MD;  Location: [facility] ORS;  Service: Urology;  Laterality: Bilateral;   CYSTOSCOPY W/ URETERAL STENT PLACEMENT Left 12/29/2016   Procedure: CYSTOSCOPY WITH STENT REPLACEMENT;  Surgeon: Vanna Scotland, MD;  Location: [facility] ORS;  Service: Urology;  Laterality: Left;   CYSTOSCOPY WITH BIOPSY N/A 10/19/2016   Procedure: CYSTOSCOPY WITH BLADDER BIOPSY, VAGINAL WALL BIOPSY;  Surgeon: Vanna Scotland, MD;  Location: [facility] ORS;  Service: Urology;  Laterality: N/A;   CYSTOSCOPY WITH STENT PLACEMENT Left 12/14/2016   Procedure: CYSTOSCOPY WITH STENT PLACEMENT;  Surgeon: Vanna Scotland, MD;  Location: [facility] ORS;  Service: Urology;  Laterality: Left;   ESOPHAGOGASTRODUODENOSCOPY (EGD) WITH PROPOFOL N/A 10/05/2016   Surgeon: Midge Minium, MD;  Location: [facility] ENDOSCOPY;  Results: Barrett's Esophagus- repeat in 3 years 09/2019   LEFT HEART CATH AND CORONARY ANGIOGRAPHY N/A 01/18/2019   Procedure: LEFT HEART CATH AND CORONARY ANGIOGRAPHY;  Surgeon: Corky Crafts, MD;  Location: [facility] INVASIVE CV LAB;  Service: Cardiovascular;  Laterality: N/A;  LITHOTRIPSY     STONE EXTRACTION WITH BASKET Left 12/29/2016   Procedure: STONE EXTRACTION WITH BASKET;  Surgeon: Vanna Scotland, MD;  Location: [facility] ORS;  Service: Urology;  Laterality: Left;   TONSILLECTOMY     URETEROSCOPY Left 12/29/2016   Procedure: URETEROSCOPY;  Surgeon: Vanna Scotland, MD;  Location: [facility] ORS;  Service: Urology;  Laterality: Left;   VULVECTOMY Right 05/10/2014   Excisional biopsy of the superior right labial majora mass; [facility]   VULVECTOMY PARTIAL  07/02/2014   Re-excision and sentinel node dissection at [facility].      A IV Location/Drains/Wounds Patient Lines/Drains/Airways Status     Active Line/Drains/Airways     Name Placement date Placement time Site Days   Peripheral IV 08/18/22 18 G Right Antecubital  08/18/22  --  Antecubital  1   Peripheral IV 08/18/22 20 G Left Wrist 08/18/22  --  Wrist  1   Ureteral Drain/Stent Left ureter 6 Fr. 12/29/16  1400  Left ureter  2059            Intake/Output Last 24 hours No intake or output data in the 24 hours ending 08/19/22 0421  Labs/Imaging Results for orders placed or performed during the hospital encounter of 08/18/22 (from the past 48 hour(s))  Basic metabolic panel     Status: Abnormal   Collection Time: 08/18/22  9:45 PM  Result Value Ref Range   Sodium 139 135 - 145 mmol/L   Potassium 4.0 3.5 - 5.1 mmol/L   Chloride 98 98 - 111 mmol/L   CO2 27 22 - 32 mmol/L   Glucose, Bld 211 (H) 70 - 99 mg/dL    Comment: Glucose reference range applies only to samples taken after fasting for at least 8 hours.   BUN 22 8 - 23 mg/dL   Creatinine, Ser 4.09 (H) 0.44 - 1.00 mg/dL   Calcium 9.4 8.9 - 81.1 mg/dL   GFR, Estimated 35 (L) >60 mL/min    Comment: (NOTE) Calculated using the CKD-EPI Creatinine Equation (2021)    Anion gap 14 5 - 15    Comment: Performed at [facility], [address], [place], [place] [address]  CBC with Differential     Status: Abnormal   Collection Time: 08/18/22  9:45 PM  Result Value Ref Range   WBC 15.4 (H) 4.0 - 10.5 K/uL   RBC 4.90 3.87 - 5.11 MIL/uL   Hemoglobin 15.1 (H) 12.0 - 15.0 g/dL   HCT 29.5 (H) 62.1 - 30.8 %   MCV 97.8 80.0 - 100.0 fL   MCH 30.8 26.0 - 34.0 pg   MCHC 31.5 30.0 - 36.0 g/dL   RDW 65.7 84.6 - 96.2 %   Platelets 243 150 - 400 K/uL   nRBC 0.0 0.0 - 0.2 %   Neutrophils Relative % 83 %   Neutro Abs 12.8 (H) 1.7 - 7.7 K/uL   Lymphocytes Relative 11 %   Lymphs Abs 1.7 0.7 - 4.0 K/uL   Monocytes Relative 5 %   Monocytes Absolute 0.7 0.1 - 1.0 K/uL   Eosinophils Relative 1 %   Eosinophils Absolute 0.1 0.0 - 0.5 K/uL   Basophils Relative 0 %   Basophils Absolute 0.0 0.0 - 0.1 K/uL   Immature Granulocytes 0 %   Abs Immature Granulocytes 0.06 0.00 - 0.07 K/uL    Comment: Performed  at [facility], [address], [place], [place] [address]  Brain natriuretic peptide     Status:  Abnormal   Collection Time: 08/18/22  9:45 PM  Result Value Ref Range   B Natriuretic Peptide 125.1 (H) 0.0 - 100.0 pg/mL    Comment: Performed at [facility], [address], [place], [place] [address]  Troponin I (High Sensitivity)     Status: None   Collection Time: 08/18/22  9:45 PM  Result Value Ref Range   Troponin I (High Sensitivity) 13 <18 ng/L    Comment: (NOTE) Elevated high sensitivity troponin I (hsTnI) values and significant  changes across serial measurements may suggest ACS but many other  chronic and acute conditions are known to elevate hsTnI results.  Refer to the "Links" section for chest pain algorithms and additional  guidance. Performed at [facility], [address], [place], [place] [address]   Urinalysis, Routine w reflex microscopic -Urine, Clean Catch     Status: Abnormal   Collection Time: 08/18/22 10:18 PM  Result Value Ref Range   Color, Urine YELLOW YELLOW   APPearance HAZY (A) CLEAR   Specific Gravity, Urine 1.013 1.005 - 1.030   pH 5.0 5.0 - 8.0   Glucose, UA >=500 (A) NEGATIVE mg/dL   Hgb urine dipstick NEGATIVE NEGATIVE   Bilirubin Urine NEGATIVE NEGATIVE   Ketones, ur NEGATIVE NEGATIVE mg/dL   Protein, ur NEGATIVE NEGATIVE mg/dL   Nitrite NEGATIVE NEGATIVE   Leukocytes,Ua TRACE (A) NEGATIVE   RBC / HPF 0-5 0 - 5 RBC/hpf   WBC, UA 11-20 0 - 5 WBC/hpf   Bacteria, UA NONE SEEN NONE SEEN   Squamous Epithelial / HPF 0-5 0 - 5 /HPF   Mucus PRESENT    Hyaline Casts, UA PRESENT     Comment: Performed at [facility], [address], [place], [place] [address]  Troponin I (High Sensitivity)     Status: None   Collection Time: 08/19/22  1:35 AM  Result Value Ref Range   Troponin I (High Sensitivity) 14 <18 ng/L    Comment: (NOTE) Elevated high sensitivity troponin I (hsTnI) values and significant  changes across serial  measurements may suggest ACS but many other  chronic and acute conditions are known to elevate hsTnI results.  Refer to the "Links" section for chest pain algorithms and additional  guidance. Performed at [facility], [address] [place], [place] [address]    CT ABDOMEN PELVIS W CONTRAST  Result Date: 08/19/2022 CLINICAL DATA:  Abdominal pain, acute, nonlocalized. Pt is a 77 yo female with pmhx significant for HTN, chronic afib on Xarelto, CHF, GERD, vulvar cancer, sleep apnea, diverticulitis, and frequent UTIs. Pt was admitted from 5/7-5/10 for LLL pna EXAM: CT ABDOMEN AND PELVIS WITH CONTRAST TECHNIQUE: Multidetector CT imaging of the abdomen and pelvis was performed using the standard protocol following bolus administration of intravenous contrast. RADIATION DOSE REDUCTION: This exam was performed according to the departmental dose-optimization program which includes automated exposure control, adjustment of the mA and/or kV according to patient size and/or use of iterative reconstruction technique. CONTRAST:  75mL OMNIPAQUE IOHEXOL 350 MG/ML SOLN COMPARISON:  CT renal 07/27/2022 FINDINGS: Lower chest: Coronary artery calcification.  Tiny hiatal hernia. Hepatobiliary: No focal liver abnormality. No gallstones, gallbladder wall thickening, or pericholecystic fluid. No biliary dilatation. Pancreas: No focal lesion. Normal pancreatic contour. No surrounding inflammatory changes. No main pancreatic ductal dilatation. Spleen: Normal in size without focal abnormality. Adrenals/Urinary Tract: No adrenal nodule bilaterally. Bilateral kidneys enhance symmetrically. Renal cortical scarring of the left kidney. No hydronephrosis. No  hydroureter. Nonobstructive punctate calcifications of the left kidney. No right nephrolithiasis. No ureterolithiasis bilaterally. The urinary bladder is unremarkable. On delayed imaging, there is no urothelial wall thickening and there are no filling defects in the opacified  portions of the bilateral collecting systems or ureters. Stomach/Bowel: Stomach is within normal limits. No evidence of bowel wall thickening or dilatation. Colonic diverticulosis. Appendix appears normal. Vascular/Lymphatic: No abdominal aorta or iliac aneurysm. Severe atherosclerotic plaque of the aorta and its branches. No abdominal, pelvic, or inguinal lymphadenopathy. Reproductive: Status post hysterectomy. No adnexal masses. Other: No intraperitoneal free fluid. No intraperitoneal free gas. No organized fluid collection. Musculoskeletal: No abdominal wall hernia or abnormality. No suspicious lytic or blastic osseous lesions. No acute displaced fracture. Multilevel degenerative changes of the spine. IMPRESSION: 1. Nonobstructive punctate left nephrolithiasis. 2. Colonic diverticulosis with no acute diverticulitis. 3. Tiny hiatal hernia. Electronically Signed   By: Tish Frederickson M.D.   On: 08/19/2022 01:22   DG Chest Port 1 View  Result Date: 08/18/2022 CLINICAL DATA:  Shortness of breath. EXAM: PORTABLE CHEST 1 VIEW COMPARISON:  Jul 27, 2022 FINDINGS: The study is limited secondary to patient rotation. Cardiac silhouette is mildly enlarged and unchanged in size. There is marked severity calcification of the thoracic aorta. Mild, diffuse the increased interstitial lung markings are noted. There is no evidence of focal consolidation, pleural effusion or pneumothorax. Multilevel degenerative changes seen throughout the thoracic spine. IMPRESSION: Cardiomegaly with mild interstitial edema. Electronically Signed   By: Aram Candela M.D.   On: 08/18/2022 21:52    Pending Labs Unresulted Labs (From admission, onward)     Start     Ordered   08/19/22 0500  Basic metabolic panel  Daily,   R      08/19/22 0358   08/19/22 0500  Magnesium  Tomorrow morning,   R        08/19/22 0358   08/19/22 0500  CBC  Daily,   R      08/19/22 0358   08/19/22 0358  Gastrointestinal Panel by PCR , Stool   (Gastrointestinal Panel by PCR, Stool                                                                                                                                                     **Does Not include CLOSTRIDIUM DIFFICILE testing. **If CDIFF testing is needed, place order from the "C Difficile Testing" order set.**)  Once,   R        08/19/22 0358   08/18/22 2343  C Difficile Quick Screen w PCR reflex  (C Difficile quick screen w PCR reflex panel )  Once, for 24 hours,   URGENT       References:    CDiff Information Tool   08/18/22 2342  Vitals/Pain Today's Vitals   08/19/22 0040 08/19/22 0119 08/19/22 0130 08/19/22 0200  BP: (!) 101/54  (!) 93/56 99/67  Pulse: 100  92 97  Resp: 13  (!) 25 (!) 26  Temp:  99.7 F (37.6 C)    TempSrc:  Oral    SpO2: 97%  97% 95%  PainSc:        Isolation Precautions Enteric precautions (UV disinfection)  Medications Medications  oxyCODONE (Oxy IR/ROXICODONE) immediate release tablet 5 mg (has no administration in time range)  metoprolol tartrate (LOPRESSOR) tablet 100 mg (has no administration in time range)  traZODone (DESYREL) tablet 50 mg (has no administration in time range)  pantoprazole (PROTONIX) EC tablet 40 mg (has no administration in time range)  fesoterodine (TOVIAZ) tablet 4 mg (has no administration in time range)  Rivaroxaban (XARELTO) tablet 15 mg (has no administration in time range)  methocarbamol (ROBAXIN) tablet 500 mg (has no administration in time range)  sodium chloride flush (NS) 0.9 % injection 3 mL (has no administration in time range)  acetaminophen (TYLENOL) tablet 650 mg (has no administration in time range)    Or  acetaminophen (TYLENOL) suppository 650 mg (has no administration in time range)  prochlorperazine (COMPAZINE) injection 5 mg (has no administration in time range)  insulin aspart (novoLOG) injection 0-6 Units (has no administration in time range)  insulin aspart (novoLOG) injection 0-5  Units (has no administration in time range)  insulin aspart (novoLOG) injection 2 Units (has no administration in time range)  metoprolol tartrate (LOPRESSOR) tablet 150 mg (150 mg Oral Given 08/18/22 2149)  iohexol (OMNIPAQUE) 350 MG/ML injection 75 mL (75 mLs Intravenous Contrast Given 08/19/22 0038)  sodium chloride 0.9 % bolus 500 mL (500 mLs Intravenous New Bag/Given 08/19/22 1610)    Mobility walks with person assist     Focused Assessments     R Recommendations: See Admitting Provider Note  Report given to:   Additional Notes:

## 2022-08-19 NOTE — H&P (Signed)
History and Physical    Holly Smith:[id number] DOB: [birth year] DOA: 08/18/2022  PCP: [name], DO   Patient coming from: Home   Chief Complaint: N/V/D, SOB, palpitations   HPI: Holly Smith is a 77 y.o. female with medical history significant for COPD, OSA, hypertension, type 2 diabetes mellitus, chronic HFpEF, atrial fibrillation on Xarelto, CKD 3A, vulvar cancer, and recent admission for pneumonia who presents to the emergency department with nausea, vomiting, diarrhea, shortness of breath, and palpitations.  Patient reports experiencing severe nausea yesterday with some dry heaves and small volume of vomiting.  She has also been experiencing loose stools multiple times daily.  She went on to develop rapid palpitations last night with shortness of breath.  She notes that she had not taken her evening dose of metoprolol yet.  She denies cough or chest pain, and notes that her usual bilateral lower extremity edema is not currently present.  ED Course: Upon arrival to the ED, patient is found to be afebrile and saturating upper 90s on 2 L/min of supplemental oxygen with elevated heart rate and systolic blood pressure in the 90s.  EKG demonstrates atrial fibrillation with rate 130.  Chest x-ray notable for cardiomegaly and mild interstitial edema.  CT of the abdomen pelvis is negative for acute findings.  Labs are most notable for creatinine 1.51, WBC 15,400, normal troponin x 2, and BNP 125.  Patient was treated with IV fluid bolus and Lopressor in the ED.  Review of Systems:  All other systems reviewed and apart from HPI, are negative.  Past Medical History:  Diagnosis Date   A-fib (HCC)    Abnormal Pap smear of vagina and vaginal HPV    Acute diastolic CHF (congestive heart failure) (HCC) 01/21/2015   Acute diverticulitis 01/28/2015   Acute on chronic diastolic heart failure (HCC) 01/21/2015   Anticoagulation adequate 08/02/2014   Overview:  Xarelto  20hs; CHADS=1+ htn, female, age  Overview:  Xarelto 20hs; CHADS=1+ htn, female, age   Arthritis    "bones ache" (01/23/2015)   Atrial fibrillation, persistent (HCC) 08/13/2015   Overview:  symptomatic (fatigue, DOE, decreased exercise tolerance, palpitations), drug refractory (propafenone, amiodarone), complicated recovery from DCCV possibly related to repiratory obstruction [facility] (patient fear of DCCV)  Overview:  symptomatic (fatigue, DOE, decreased exercise tolerance, palpitations), drug refractory (propafenone, amiodarone), complicated recovery from DCCV possibly relat   Benign neoplasm of ascending colon    Benign neoplasm of descending colon    Benign neoplasm of sigmoid colon    Blood in stool 01/21/2015   Breath shortness 08/20/2013   CAFL (chronic airflow limitation) (HCC) 12/16/2014   Cancer (HCC)    vulvar cancer   CCF (congestive cardiac failure) (HCC) 08/03/2013   Overview:  ACUTE SYSTOLIC    Chest pain 08/20/2013   CHF (congestive heart failure) (HCC)    CHF (congestive heart failure) (HCC)    Chronic anticoagulation    She is on Xarelto for Afib   Chronic atrial fibrillation (HCC)    Chronic combined systolic and diastolic CHF, NYHA class 3 (HCC) 01/28/2015   Chronic diastolic heart failure (HCC) 05/05/2015   Condyloma acuminatum of vulva 08/02/2014   COPD (chronic obstructive pulmonary disease) (HCC) 08/13/2015   Diverticulitis large intestine w/o perforation or abscess w/bleeding 01/28/2015   Diverticulitis of large intestine without perforation or abscess with bleeding    Dysmetabolic syndrome 12/16/2014   Overview:  BG-166 06/15/2013; elevated BMI; central obesity    Dyspnea    Echocardiogram  abnormal    Mitral regurgitation, tricuspic regurgitation   Essential hypertension 10/07/2014   Trial off acei 10/07/2014  Due to atypical sob (talking = walking)> no real change 11/18/2014     Family history of adverse reaction to anesthesia    sister gets very nauseated and  vomits   Gastritis without bleeding    GERD (gastroesophageal reflux disease)    History of kidney stones    History of stress test    Myoview 8/16: EF 42%, anteroseptal, inferoseptal, anterior, apical anterior and apical defect (likely represents breast attenuation versus scar), no ischemia; intermediate risk because of low EF   HPV (human papilloma virus) anogenital infection    03/21/12 PAP + HR HPV   HTN (hypertension) 12/16/2014   Hypertension    Hypertensive heart disease 05/12/2016   Hypertensive urgency 01/21/2015   Insulin resistance    Kidney stones    Malignant neoplasm of vulva (HCC) 06/19/2014   Overview:  Ms TAMURA TARNOWSKI is a 77 yo who was referred by Dr Feliberto Gottron for a vulvar lesion. 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 The specimen had negative margins, but was within 2 mm of the cancer.     Mitral regurgitation 04/23/2013   Overview:  "Moderate to severe" by TTE, "mild to mod" by TEE  Overview:  "Moderate to severe" by TTE, "mild to mod" by TEE   Obesity    OSA (obstructive sleep apnea) 11/27/2014   07/2013 RDI 17/h CPAP 8    Other specified diseases of esophagus    Postoperative nausea and vomiting 1983   many years ago with hysterectomy   PVC (premature ventricular contraction) 12/16/2014   TI (tricuspid incompetence) 04/23/2013   Overview:  "moderate" by TTE    Urolithiasis    Vertigo    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 History of genital warts and vulvar condyloma   Vulvar cancer, carcinoma (HCC) 05/10/2014    Past Surgical History:  Procedure Laterality Date   ABDOMINAL HYSTERECTOMY     APPENDECTOMY     BILATERAL SALPINGOOPHORECTOMY  2003   benign ovarian cancer   CARDIOVERSION  2014-2015   "[facility]"   COLONOSCOPY WITH PROPOFOL N/A 06/10/2015   Procedure: COLONOSCOPY WITH PROPOFOL;  Surgeon: Midge Minium, MD;  Location:  [facility] ENDOSCOPY;  Service: Endoscopy;  Laterality: N/A;   CORONARY STENT INTERVENTION N/A 01/18/2019   Procedure: CORONARY STENT INTERVENTION;  Surgeon: Corky Crafts, MD;  Location: [facility] INVASIVE CV LAB;  Service: Cardiovascular;  Laterality: N/A;   CYSTOSCOPY W/ RETROGRADES Bilateral 10/19/2016   Procedure: CYSTOSCOPY WITH RETROGRADE PYELOGRAM;  Surgeon: Vanna Scotland, MD;  Location: [facility] ORS;  Service: Urology;  Laterality: Bilateral;   CYSTOSCOPY W/ URETERAL STENT PLACEMENT Left 12/29/2016   Procedure: CYSTOSCOPY WITH STENT REPLACEMENT;  Surgeon: Vanna Scotland, MD;  Location: [facility] ORS;  Service: Urology;  Laterality: Left;   CYSTOSCOPY WITH BIOPSY N/A 10/19/2016   Procedure: CYSTOSCOPY WITH BLADDER BIOPSY, VAGINAL WALL BIOPSY;  Surgeon: Vanna Scotland, MD;  Location: [facility] ORS;  Service: Urology;  Laterality: N/A;   CYSTOSCOPY WITH STENT PLACEMENT Left 12/14/2016   Procedure: CYSTOSCOPY WITH STENT PLACEMENT;  Surgeon: Vanna Scotland, MD;  Location: [facility] ORS;  Service: Urology;  Laterality: Left;   ESOPHAGOGASTRODUODENOSCOPY (EGD) WITH PROPOFOL N/A 10/05/2016   Surgeon: Midge Minium, MD;  Location: [facility] ENDOSCOPY;  Results: Barrett's Esophagus- repeat in 3 years 09/2019   LEFT  HEART CATH AND CORONARY ANGIOGRAPHY N/A 01/18/2019   Procedure: LEFT HEART CATH AND CORONARY ANGIOGRAPHY;  Surgeon: Corky Crafts, MD;  Location: [facility] INVASIVE CV LAB;  Service: Cardiovascular;  Laterality: N/A;   LITHOTRIPSY     STONE EXTRACTION WITH BASKET Left 12/29/2016   Procedure: STONE EXTRACTION WITH BASKET;  Surgeon: Vanna Scotland, MD;  Location: [facility] ORS;  Service: Urology;  Laterality: Left;   TONSILLECTOMY     URETEROSCOPY Left 12/29/2016   Procedure: URETEROSCOPY;  Surgeon: Vanna Scotland, MD;  Location: [facility] ORS;  Service: Urology;  Laterality: Left;   VULVECTOMY Right 05/10/2014   Excisional biopsy of the superior right labial majora mass; [facility]   VULVECTOMY PARTIAL  07/02/2014    Re-excision and sentinel node dissection at [facility].     Social History:   reports that she quit smoking about 10 years ago. Her smoking use included cigarettes. She has a 50.00 pack-year smoking history. She has never used smokeless tobacco. She reports that she does not drink alcohol and does not use drugs.  Allergies  Allergen Reactions   Ciprofloxacin Other (See Comments)    SOB    Family History  Problem Relation Age of Onset   Prostate cancer Brother 82       still living and well   Heart attack Father    Stroke Mother    Diabetes Mother    Hypertension Mother    Heart attack Brother    Pancreatic cancer Brother    Heart disease Sister    Breast cancer Neg Hx    Kidney cancer Neg Hx    Bladder Cancer Neg Hx      Prior to Admission medications   Medication Sig Start Date End Date Taking? Authorizing Provider  BIOTIN PO Take 1 tablet by mouth daily.     [provider]  Cholecalciferol (VITAMIN D3) 1000 units CAPS Take by mouth.    [provider]  diclofenac Sodium (VOLTAREN) 1 % GEL Apply topically. 12/19/20   [provider]  FARXIGA 10 MG TABS tablet Take 10 mg by mouth daily. 12/25/20   [provider]  fluconazole (DIFLUCAN) 150 MG tablet Take 1 tab, repeat in 72 hours if no improvement. 08/17/22   [name], DO  fluticasone (FLONASE) 50 MCG/ACT nasal spray Place 2 sprays into both nostrils daily. 12/25/19   Daphine Deutscher, Mary-Margaret, FNP  glipiZIDE (GLUCOTROL) 5 MG tablet Take 1 tablet (5 mg total) by mouth 2 (two) times daily before a meal. 08/12/22   Wendling, [name], DO  irbesartan (AVAPRO) 75 MG tablet Take 1 tablet (75 mg total) by mouth daily. 08/02/22   Lonia Blood, MD  methocarbamol (ROBAXIN) 500 MG tablet Take 1 tablet (500 mg total) by mouth every 6 (six) hours as needed for muscle spasms. 07/30/22   Lonia Blood, MD  metoprolol tartrate (LOPRESSOR) 100 MG tablet TAKE 1 & 1/2 TABS (150MG ) BY MOUTH  2 (TWO) TIMES DAILY. Patient taking differently: Take 100 mg by mouth 2 (two) times daily. 04/27/22   Corky Crafts, MD  nitrofurantoin, macrocrystal-monohydrate, (MACROBID) 100 MG capsule Take 1 capsule (100 mg total) by mouth 2 (two) times daily for 7 days. 08/17/22 08/24/22  [name], DO  nitroGLYCERIN (NITROSTAT) 0.4 MG SL tablet Place 1 tablet (0.4 mg total) under the tongue every 5 (five) minutes as needed. 01/18/19   Arty Baumgartner, NP  oxyCODONE (OXY IR/ROXICODONE) 5 MG immediate release tablet Take 1 tablet (5 mg  total) by mouth every 6 (six) hours as needed for severe pain. 07/30/22   Lonia Blood, MD  pantoprazole (PROTONIX) 40 MG tablet Take 1 tablet (40 mg total) by mouth daily. 03/16/22   Corky Crafts, MD  Rivaroxaban (XARELTO) 15 MG TABS tablet Take 1 tablet (15 mg total) by mouth daily with supper. 10/26/21   Corky Crafts, MD  torsemide (DEMADEX) 20 MG tablet Take 2 tablets (40 mg) PO daily 07/31/22   Lonia Blood, MD  TOVIAZ 4 MG TB24 tablet Take 4 mg by mouth daily. 02/19/21   [provider]  traZODone (DESYREL) 50 MG tablet Take 1 tablet (50 mg total) by mouth at bedtime as needed for sleep. 05/25/22   [name], DO  vitamin B-12 (CYANOCOBALAMIN) 1000 MCG tablet Take 1,000 mcg by mouth every other day.     [provider]  potassium chloride SA (K-DUR,KLOR-CON) 20 MEQ tablet Take 20 mEq by mouth daily.  07/30/19  [provider]    Physical Exam: Vitals:   08/19/22 0040 08/19/22 0119 08/19/22 0130 08/19/22 0200  BP: (!) 101/54  (!) 93/56 99/67  Pulse: 100  92 97  Resp: 13  (!) 25 (!) 26  Temp:  99.7 F (37.6 C)    TempSrc:  Oral    SpO2: 97%  97% 95%     Constitutional: NAD, calm  Eyes: PERTLA, lids and conjunctivae normal ENMT: Mucous membranes are moist. Posterior pharynx clear of any exudate or lesions.   Neck: supple, no masses  Respiratory: no wheezing, no crackles. No accessory  muscle use.  Cardiovascular: Rate ~80 and irregularly irregular. No extremity edema. No JVD. Abdomen: soft, no guarding or rebound pain. Bowel sounds active.  Musculoskeletal: no clubbing / cyanosis. No joint deformity upper and lower extremities.   Skin: no significant rashes, lesions, ulcers. Warm, dry, well-perfused. Neurologic: CN 2-12 grossly intact. Moving all extremities. Alert and oriented.  Psychiatric: Pleasant. Cooperative.    Labs and Imaging on Admission: I have personally reviewed following labs and imaging studies  CBC: Recent Labs  Lab 08/18/22 2145  WBC 15.4*  NEUTROABS 12.8*  HGB 15.1*  HCT 47.9*  MCV 97.8  PLT 243   Basic Metabolic Panel: Recent Labs  Lab 08/18/22 2145  NA 139  K 4.0  CL 98  CO2 27  GLUCOSE 211*  BUN 22  CREATININE 1.51*  CALCIUM 9.4   GFR: Estimated Creatinine Clearance: 30.7 mL/min (A) (by C-G formula based on SCr of 1.51 mg/dL (H)). Liver Function Tests: No results for input(s): "AST", "ALT", "ALKPHOS", "BILITOT", "PROT", "ALBUMIN" in the last 168 hours. No results for input(s): "LIPASE", "AMYLASE" in the last 168 hours. No results for input(s): "AMMONIA" in the last 168 hours. Coagulation Profile: No results for input(s): "INR", "PROTIME" in the last 168 hours. Cardiac Enzymes: No results for input(s): "CKTOTAL", "CKMB", "CKMBINDEX", "TROPONINI" in the last 168 hours. BNP (last 3 results) No results for input(s): "PROBNP" in the last 8760 hours. HbA1C: No results for input(s): "HGBA1C" in the last 72 hours. CBG: No results for input(s): "GLUCAP" in the last 168 hours. Lipid Profile: No results for input(s): "CHOL", "HDL", "LDLCALC", "TRIG", "CHOLHDL", "LDLDIRECT" in the last 72 hours. Thyroid Function Tests: No results for input(s): "TSH", "T4TOTAL", "FREET4", "T3FREE", "THYROIDAB" in the last 72 hours. Anemia Panel: No results for input(s): "VITAMINB12", "FOLATE", "FERRITIN", "TIBC", "IRON", "RETICCTPCT" in the last 72  hours. Urine analysis:    Component Value Date/Time   COLORURINE  YELLOW 08/18/2022 2218   APPEARANCEUR HAZY (A) 08/18/2022 2218   APPEARANCEUR Cloudy (A) 12/22/2016 1158   LABSPEC 1.013 08/18/2022 2218   PHURINE 5.0 08/18/2022 2218   GLUCOSEU >=500 (A) 08/18/2022 2218   HGBUR NEGATIVE 08/18/2022 2218   BILIRUBINUR NEGATIVE 08/18/2022 2218   BILIRUBINUR negative 08/17/2022 1608   BILIRUBINUR Negative 12/22/2016 1158   KETONESUR NEGATIVE 08/18/2022 2218   PROTEINUR NEGATIVE 08/18/2022 2218   UROBILINOGEN 0.2 08/17/2022 1608   UROBILINOGEN 1.0 01/28/2015 2058   NITRITE NEGATIVE 08/18/2022 2218   LEUKOCYTESUR TRACE (A) 08/18/2022 2218   Sepsis Labs: @LABRCNTIP (procalcitonin:4,lacticidven:4) ) Recent Results (from the past 240 hour(s))  Urine Culture     Status: None   Collection Time: 08/17/22  5:01 PM   Specimen: Urine  Result Value Ref Range Status   MICRO NUMBER: [id number]  Final   SPECIMEN QUALITY: Adequate  Final   Sample Source URINE, CLEAN CATCH  Final   STATUS: FINAL  Final   Result:   Final    Mixed genital flora isolated. These superficial bacteria are not indicative of a urinary tract infection. No further organism identification is warranted on this specimen. If clinically indicated, recollect clean-catch, mid-stream urine and transfer  immediately to Urine Culture Transport Tube.      Radiological Exams on Admission: CT ABDOMEN PELVIS W CONTRAST  Result Date: 08/19/2022 CLINICAL DATA:  Abdominal pain, acute, nonlocalized. Pt is a 77 yo female with pmhx significant for HTN, chronic afib on Xarelto, CHF, GERD, vulvar cancer, sleep apnea, diverticulitis, and frequent UTIs. Pt was admitted from 5/7-5/10 for LLL pna EXAM: CT ABDOMEN AND PELVIS WITH CONTRAST TECHNIQUE: Multidetector CT imaging of the abdomen and pelvis was performed using the standard protocol following bolus administration of intravenous contrast. RADIATION DOSE REDUCTION: This exam was performed  according to the departmental dose-optimization program which includes automated exposure control, adjustment of the mA and/or kV according to patient size and/or use of iterative reconstruction technique. CONTRAST:  75mL OMNIPAQUE IOHEXOL 350 MG/ML SOLN COMPARISON:  CT renal 07/27/2022 FINDINGS: Lower chest: Coronary artery calcification.  Tiny hiatal hernia. Hepatobiliary: No focal liver abnormality. No gallstones, gallbladder wall thickening, or pericholecystic fluid. No biliary dilatation. Pancreas: No focal lesion. Normal pancreatic contour. No surrounding inflammatory changes. No main pancreatic ductal dilatation. Spleen: Normal in size without focal abnormality. Adrenals/Urinary Tract: No adrenal nodule bilaterally. Bilateral kidneys enhance symmetrically. Renal cortical scarring of the left kidney. No hydronephrosis. No hydroureter. Nonobstructive punctate calcifications of the left kidney. No right nephrolithiasis. No ureterolithiasis bilaterally. The urinary bladder is unremarkable. On delayed imaging, there is no urothelial wall thickening and there are no filling defects in the opacified portions of the bilateral collecting systems or ureters. Stomach/Bowel: Stomach is within normal limits. No evidence of bowel wall thickening or dilatation. Colonic diverticulosis. Appendix appears normal. Vascular/Lymphatic: No abdominal aorta or iliac aneurysm. Severe atherosclerotic plaque of the aorta and its branches. No abdominal, pelvic, or inguinal lymphadenopathy. Reproductive: Status post hysterectomy. No adnexal masses. Other: No intraperitoneal free fluid. No intraperitoneal free gas. No organized fluid collection. Musculoskeletal: No abdominal wall hernia or abnormality. No suspicious lytic or blastic osseous lesions. No acute displaced fracture. Multilevel degenerative changes of the spine. IMPRESSION: 1. Nonobstructive punctate left nephrolithiasis. 2. Colonic diverticulosis with no acute diverticulitis.  3. Tiny hiatal hernia. Electronically Signed   By: Tish Frederickson M.D.   On: 08/19/2022 01:22   DG Chest Port 1 View  Result Date: 08/18/2022 CLINICAL DATA:  Shortness of breath. EXAM: PORTABLE CHEST  1 VIEW COMPARISON:  Jul 27, 2022 FINDINGS: The study is limited secondary to patient rotation. Cardiac silhouette is mildly enlarged and unchanged in size. There is marked severity calcification of the thoracic aorta. Mild, diffuse the increased interstitial lung markings are noted. There is no evidence of focal consolidation, pleural effusion or pneumothorax. Multilevel degenerative changes seen throughout the thoracic spine. IMPRESSION: Cardiomegaly with mild interstitial edema. Electronically Signed   By: Aram Candela M.D.   On: 08/18/2022 21:52    EKG: Independently reviewed. Atrial fibrillation, rate 130.   Assessment/Plan   1. AKI superimposed on CKD 3A  - SCr is 1.51 on admission, up from 1.10 earlier this month  - Likely prerenal in setting of N/V/D  - Given 500 mL NS in ED  - Hold diuretic and ARB, renally-dose medications, repeat chem panel    2. Rapid atrial fibrillation  - Rate improved with IVF and beta-blocker in ED  - Continue metoprolol and Xarelto    3. N/V/D  - Exam is benign and no acute findings on CT   - Continue supportive care, monitor electrolytes and renal function, check C diff and GI pathogen panel    4. Chronic HFpEF  - Appears compensated   - Hold ARB and diuretics for now in light of N/V/D with decreased GFR, monitor weight and I/Os    5. Type II DM  - A1c was 7.8% in May 2024  - Check CBGs and use short-acting insulin for now   6. CAD  - No anginal complaints  - Continue beta-blocker   7. OSA  - CPAP qHS    8. Hypertension  - Hold ARB as above, continue beta-blocker     DVT prophylaxis: Xarelto  Code Status: Full  Level of Care: Level of care: Telemetry Cardiac Family Communication: Son at bedside   Disposition Plan:  Patient is from:  home  Anticipated d/c is to: Home  Anticipated d/c date is: Possibly as early as 08/20/22  Patient currently: Pending stable HR and renal function, tolerance of adequate oral intake  Consults called: None  Admission status: Observation     Briscoe Deutscher, MD [facility]  08/19/2022, 3:58 AM

## 2022-08-19 NOTE — TOC Initial Note (Signed)
Transition of Care [facility]) - Initial/Assessment Note    Patient Details  Name: Holly Smith MRN: [id number] Date of Birth: 02/06/1946  Transition of Care [facility] CM/SW Contact:    Leone Haven, RN Phone Number: 08/19/2022, 2:19 PM  Clinical Narrative:                 From home with son, she is indep at home, has several canes, walker, has cpap machine that she uses at night.  Son or grand daughter will transport home at dc.  She has a PCP and insurance on file.  [facility] [place].  She does not have any HH services at this time.  She was on xarelto pta.  She does not use oxygen at home.           Expected Discharge Plan and Services                                              Prior Living Arrangements/Services                       Activities of Daily Living Home Assistive Devices/Equipment: CBG Meter, Walker (specify type), Cane (specify quad or straight) ADL Screening (condition at time of admission) Patient's cognitive ability adequate to safely complete daily activities?: Yes Is the patient deaf or have difficulty hearing?: No Does the patient have difficulty seeing, even when wearing glasses/contacts?: No Does the patient have difficulty concentrating, remembering, or making decisions?: No Patient able to express need for assistance with ADLs?: Yes Does the patient have difficulty dressing or bathing?: No Independently performs ADLs?: Yes (appropriate for developmental age) Does the patient have difficulty walking or climbing stairs?: Yes Weakness of Legs: Both Weakness of Arms/Hands: Both  Permission Sought/Granted                  Emotional Assessment              Admission diagnosis:  Dehydration [E86.0] Acute pulmonary edema (HCC) [J81.0] Rapid atrial fibrillation (HCC) [I48.91] Atrial fibrillation with rapid ventricular response (HCC) [I48.91] Diarrhea, unspecified type [R19.7] Patient Active  Problem List   Diagnosis Date Noted   Rapid atrial fibrillation (HCC) 08/19/2022   Nausea vomiting and diarrhea 08/19/2022   Acute renal failure superimposed on stage 3a chronic kidney disease (HCC) 08/19/2022   Acute respiratory failure with hypoxia (HCC) 07/27/2022   Gastroenteritis 07/27/2022   Acute cystitis 07/27/2022   Morbid obesity with body mass index (BMI) of 40.0 or higher (HCC) 05/25/2022   OSA on CPAP 05/25/2022   Insomnia 05/25/2022   CAD (coronary artery disease) 10/08/2020   Type 2 diabetes mellitus without complication, without long-term current use of insulin (HCC) 10/08/2020   Stable angina    Lichen planus 05/31/2018   Vertigo 03/11/2017   Sepsis (HCC) 12/13/2016   Dyspepsia    Gastritis without bleeding    Other specified diseases of esophagus    History of alcohol use 09/07/2016   Hypertensive heart disease 05/12/2016   Atrial fibrillation, persistent (HCC) 08/13/2015   COPD (chronic obstructive pulmonary disease) (HCC) 08/13/2015   Personal history of other specified conditions 08/13/2015   Insulin resistance 08/13/2015   Obesity, unspecified 08/13/2015   Special screening for malignant neoplasms, colon    Benign neoplasm of ascending colon    Benign  neoplasm of descending colon    Benign neoplasm of sigmoid colon    Chronic diastolic heart failure (HCC) 05/05/2015   Diverticulitis 01/31/2015   Diverticulitis large intestine w/o perforation or abscess w/bleeding 01/28/2015   Chronic combined systolic and diastolic CHF, NYHA class 3 (HCC) 01/28/2015   Acute diverticulitis 01/28/2015   Diverticulitis of large intestine without perforation or abscess with bleeding    Acute on chronic diastolic heart failure (HCC) 01/21/2015   Hypertensive urgency 01/21/2015   Morbid obesity (HCC) 01/21/2015   Blood in stool 01/21/2015   Acute diastolic CHF (congestive heart failure) (HCC) 01/21/2015   CAFL (chronic airflow limitation) (HCC) 12/16/2014   Personal history  of perinatal problems 12/16/2014   Hyperlipidemia, unspecified 12/16/2014   Dysmetabolic syndrome 12/16/2014   Kidney stones 12/16/2014   PVC (premature ventricular contraction) 12/16/2014   Snoring 12/16/2014   Stress incontinence 12/16/2014   Fast heart beat 12/16/2014   Calculus (=stone) 12/16/2014   OSA (obstructive sleep apnea) 11/27/2014   Dyspnea 10/07/2014   Essential hypertension 10/07/2014   Obesity 10/07/2014   Genital warts 08/02/2014   Condyloma acuminatum of vulva 08/02/2014   Condylomata lata of vulva 08/02/2014   Anticoagulation adequate 08/02/2014   Chronic atrial fibrillation (HCC) 08/02/2014   Current tobacco use 08/02/2014   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   Malignant neoplasm of vulva (HCC) 06/19/2014   Vulvar cancer (HCC) 05/10/2014   Vulvar cancer, carcinoma (HCC) 05/10/2014   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 [place] [address] Phone: [phone] Fax: [phone]  [facility] [address] [place] - [address] [facility] [place] [address] Phone: [phone] Fax: [phone]  [facility] [address] [place] [address] Phone: [phone] Fax: [phone]     Social Determinants of Health (SDOH) Social History: SDOH Screenings   Food Insecurity: No Food Insecurity (07/28/2022)  Housing: Low Risk  (07/28/2022)  Transportation Needs: No Transportation Needs (07/28/2022)  Utilities: Not At Risk  (07/28/2022)  Alcohol Screen: Low Risk  (06/28/2022)  Depression (PHQ2-9): Low Risk  (06/29/2022)  Financial Resource Strain: Low Risk  (06/28/2022)  Physical Activity: Insufficiently Active (06/28/2022)  Social Connections: Unknown (06/28/2022)  Stress: No Stress Concern Present (06/28/2022)  Tobacco Use: Medium Risk (08/19/2022)   SDOH Interventions:     Readmission Risk Interventions    07/28/2022    3:56 PM  Readmission Risk Prevention Plan  Post Dischage Appt Complete  Medication Screening Complete  Transportation Screening Complete

## 2022-08-19 NOTE — Progress Notes (Addendum)
Brief same day note:  Patient is a 77 year old female with history of COPD, OSA, hypertension, diabetes type 2, chronic diastolic CHF, permanent A-fib on Xarelto, CKD stage IIIa, bladder cancer, recent admission for pneumonia who presented with nausea, vomiting, diarrhea, shortness of breath and palpitations.  On presentation she was found to be afebrile, heart rate reported in the range of 130, rhythm was A-fib.  Chest x-ray showed cardiomegaly, mild interstitial edema.  CT abdomen/pelvis was negative for acute findings.  Lab work showed creatinine of 1.5, WBC of 14.4, BNP of 125.  Patient seen and examined at bedside today.  She complained of weakness.  Diarrhea has slowed down.  Long discussion held with the family at bedside/phone.  Assessment and plan:  AKI in CKD stage IIIa: Recent creatinine of 1.1.  Presented with 1.5.  Likely prerenal in the setting of nausea, vomiting, diarrhea.  Creatinine of 1.4 today.  Permanent A-fib with RVR: Heart rate in the range of 130s on presentation.  Rate improved with IV fluid and IV metoprolol.  Continue oral metoprolol and Xarelto for now  Nausea, vomiting, diarrhea: abdominal exam is benign, no acute findings on CT.  C. difficile, GI pathogen panel pending. She has history of chronic diarrhea for almost a month.  After discussion with the daughter on phone, they are concerned about C. difficile because she was on nitrofurantoin for UTI.  It was also presumed that the Trulicity she was taking contributed to nausea, vomiting and diarrhea as per last admission note. We recommend to avoid Trulicity after discharge.  Chronic diastolic CHF: Appears compensated or slightly dehydrated on presentation.  Home ARB, diuretics on hold.  Takes Marcelline Deist.  Takes torsemide at home  COPD: Continue current bronchodilators.  Not on oxygen at home.  On 2 L of oxygen at present.  Will continue to wean  Type 2 diabetes: Recent A1c of 7.8 as of 24 Jul 2022.  Continue to monitor  blood sugars.  Patient was taking Trulicity in the past which has been discontinued.  On her last admission, she was started on Glucotrol.  Patient declines to take insulin so Glucotrol restarted  Coronary artery disease: No anginal symptoms.  Continue beta-blocker  OSA: On CPAP at home.  Hypertension: Currently normotensive.  Continue current medications  Leukocytosis: Unclear etiology.  Patient denies any dysuria.  No fever.  UA not impressive for UTI.  Continue to monitor without antibiotics.  Weakness: Will consult PT/OT

## 2022-08-19 NOTE — Telephone Encounter (Signed)
Initial Comment Caller states her grandmother was seen yesterday, she is having a hard time breathing. Translation No Nurse Assessment Nurse: Tori Milks, RN, Marchelle Folks Date/Time (Eastern Time): 08/18/2022 8:00:01 PM Confirm and document reason for call. If symptomatic, describe symptoms. ---Caller states that grandmother is having trouble breathing- O2: 84% and pulse: 97@2004 ; abdominal pain Does the patient have any new or worsening symptoms? ---Yes Will a triage be completed? ---Yes Related visit to physician within the last 2 weeks? ---No Does the PT have any chronic conditions? (i.e. diabetes, asthma, this includes High risk factors for pregnancy, etc.) ---Yes List chronic conditions. ---Afib; CHF, stent placed Is this a behavioral health or substance abuse call? ---No Guidelines Guideline Title Affirmed Question Affirmed Notes Nurse Date/Time (Eastern Time) Breathing Difficulty SEVERE difficulty breathing (e.g., struggling for each breath, speaks in single words) Titlow, RN, Marchelle Folks 08/18/2022 8:06:45 PM Disp. Time Lamount Cohen Time) Disposition Final User 08/18/2022 7:58:43 PM Send to Urgent Queue Lorrin Goodell 08/18/2022 8:09:17 PM Call EMS 911 Now Yes Titlow, RN, Marchelle Folks PLEASE NOTE: All timestamps contained within this report are represented as [place] Standard Time. CONFIDENTIALTY NOTICE: This fax transmission is intended only for the addressee. It contains information that is legally privileged, confidential or otherwise protected from use or disclosure. If you are not the intended recipient, you are strictly prohibited from reviewing, disclosing, copying using or disseminating any of this information or taking any action in reliance on or regarding this information. If you have received this fax in error, please notify us immediately by telephone so that we can arrange for its return to [place]. Phone: [phone], Toll-Free: [phone], Fax: [phone] Page: 2 of 2 Call Id:  [id number] Disp. Time Lamount Cohen Time) Disposition Final User 08/18/2022 8:13:07 PM 911 Outcome Documentation Titlow, RN, Marchelle Folks Reason: Called back to check but went to [facility] Final Disposition 08/18/2022 8:09:17 PM Call EMS 911 Now Yes Titlow, RN, Earnestine Leys Disagree/Comply Comply Caller Understands Yes PreDisposition InappropriateToAsk Care Advice Given Per Guideline CARE ADVICE given per Breathing Difficulty (Adult) guideline. * Triager Discretion: I'll call you back in a few minutes to be sure you were able to reach them. * Immediate medical attention is needed. You need to hang up and call 911 (or an ambulance). CALL EMS 911 NOW: Comments User: Matthew Folks, RN Date/Time Lamount Cohen Time): 08/18/2022 8:10:23 PM Granddaughter is concerned that breathing difficulties is from abx prescribed nitrofurantoin and wants to let PCP know. Referrals [facility] - ED

## 2022-08-19 NOTE — Progress Notes (Signed)
MD, pt is on Torsemide 20mg  2 x day at home, pt is concerned about missing dose and will get fluid overloaded very easily, Thanks, Lavonda Jumbo RN

## 2022-08-19 NOTE — Care Management Obs Status (Signed)
MEDICARE OBSERVATION STATUS NOTIFICATION   Patient Details  Name: Holly Smith MRN: [id number] Date of Birth: November 30, 1945   Medicare Observation Status Notification Given:  Yes    Leone Haven, RN 08/19/2022, 2:18 PM

## 2022-08-20 DIAGNOSIS — I4891 Unspecified atrial fibrillation: Secondary | ICD-10-CM | POA: Diagnosis not present

## 2022-08-20 LAB — GASTROINTESTINAL PANEL BY PCR, STOOL (REPLACES STOOL CULTURE)

## 2022-08-20 LAB — BASIC METABOLIC PANEL
Anion gap: 7 (ref 5–15)
BUN: 20 mg/dL (ref 8–23)
CO2: 27 mmol/L (ref 22–32)
Calcium: 8.9 mg/dL (ref 8.9–10.3)
Chloride: 100 mmol/L (ref 98–111)
Creatinine, Ser: 1.25 mg/dL — ABNORMAL HIGH (ref 0.44–1.00)
GFR, Estimated: 44 mL/min — ABNORMAL LOW (ref >60)
Glucose, Bld: 113 mg/dL — ABNORMAL HIGH (ref 70–99)
Potassium: 3.7 mmol/L (ref 3.5–5.1)
Sodium: 134 mmol/L — ABNORMAL LOW (ref 135–145)

## 2022-08-20 LAB — CBC
HCT: 38.6 % (ref 36.0–46.0)
Hemoglobin: 12.4 g/dL (ref 12.0–15.0)
MCH: 31.4 pg (ref 26.0–34.0)
MCHC: 32.1 g/dL (ref 30.0–36.0)
MCV: 97.7 fL (ref 80.0–100.0)
Platelets: 180 10*3/uL (ref 150–400)
RBC: 3.95 MIL/uL (ref 3.87–5.11)
RDW: 14 % (ref 11.5–15.5)
WBC: 9.8 10*3/uL (ref 4.0–10.5)
nRBC: 0 % (ref 0.0–0.2)

## 2022-08-20 LAB — GLUCOSE, CAPILLARY: Glucose-Capillary: 149 mg/dL — ABNORMAL HIGH (ref 70–99)

## 2022-08-20 MED ORDER — AZITHROMYCIN 500 MG PO TABS: 500.0000 mg | ORAL_TABLET | Freq: Every day | ORAL | 0 refills | Status: AC

## 2022-08-20 NOTE — TOC Transition Note (Signed)
Transition of Care [facility]) - CM/SW Discharge Note   Patient Details  Name: Holly Smith MRN: [id number] Date of Birth: 1945-07-17  [facility] Care [facility]) CM/SW Contact:  Leone Haven, RN Phone Number: 08/20/2022, 10:35 AM   Clinical Narrative:    Patient is for dc today, she has transportation. Has no other needs.         Patient Goals and CMS Choice      Discharge Placement                         Discharge Plan and Services Additional resources added to the After Visit Summary for                                       Social Determinants of Health (SDOH) Interventions SDOH Screenings   Food Insecurity: No Food Insecurity (07/28/2022)  Housing: Low Risk  (07/28/2022)  Transportation Needs: No Transportation Needs (07/28/2022)  Utilities: Not At Risk (07/28/2022)  Alcohol Screen: Low Risk  (06/28/2022)  Depression (PHQ2-9): Low Risk  (06/29/2022)  Financial Resource Strain: Low Risk  (06/28/2022)  Physical Activity: Insufficiently Active (06/28/2022)  Social Connections: Unknown (06/28/2022)  Stress: No Stress Concern Present (06/28/2022)  Tobacco Use: Medium Risk (08/19/2022)     Readmission Risk Interventions    07/28/2022    3:56 PM  Readmission Risk Prevention Plan  Post Dischage Appt Complete  Medication Screening Complete  Transportation Screening Complete

## 2022-08-20 NOTE — Discharge Summary (Signed)
Physician Discharge Summary  Holly Smith:[id number] DOB: [birth year] DOA: 08/18/2022  PCP: [name], DO  Admit date: 08/18/2022 Discharge date: 08/20/2022  Admitted From: Home Disposition:  Home  Discharge Condition:Stable CODE STATUS:FULL Diet recommendation: Heart Healthy   Brief/Interim Summary: Patient is a 76 year old female with history of COPD, OSA, hypertension, diabetes type 2, chronic diastolic CHF, permanent A-fib on Xarelto, CKD stage IIIa, bladder cancer, recent admission for pneumonia who presented with nausea, vomiting, diarrhea, shortness of breath and palpitations.  On presentation she was found to be afebrile, heart rate reported in the range of 130, rhythm was A-fib.  Chest x-ray showed cardiomegaly, mild interstitial edema.  CT abdomen/pelvis was negative for acute findings.  Lab work showed creatinine of 1.5, WBC of 14.4, BNP of 125.  Hospital course remained stable.  Her diarrhea has slowly improved.  Her GI pathogen panel showed enteropathogenic E. coli and norovirus.  Started on 5 days course of azithromycin.  Today's lab work showed improved kidney function and leukocytosis.  Medically stable for discharge to home today.  Following problems were addressed during the hospitalization:  AKI in CKD stage IIIa: Recent creatinine of 1.1.  Presented with 1.5.  Likely prerenal in the setting of nausea, vomiting, diarrhea.  Creatinine of 1.2 today.   Permanent A-fib with RVR: Heart rate in the range of 130s on presentation.  Rate improved with IV fluid and IV metoprolol.  Continue oral metoprolol and Xarelto for now   Nausea, vomiting, diarrhea: abdominal exam is benign, no acute findings on CT.  C. Difficile not resulted , GI pathogen panel showed E. coli and norovirus. Started on 5-day course of azithromycin.  She did not have any fever.  Nausea, vomiting and diarrhea have significantly improved It was also presumed that the Trulicity she was taking  contributed to nausea, vomiting and diarrhea as per last admission note. We recommend to avoid Trulicity after discharge.   Chronic diastolic CHF: Appeared compensated or slightly dehydrated on presentation.   Takes Marcelline Deist.  Takes torsemide at home.   COPD: Continue current bronchodilators.  Not on oxygen at home.  On room air this morning  Type 2 diabetes: Recent A1c of 7.8 as of 24 Jul 2022.  Continue to monitor blood sugars.  Patient was taking Trulicity in the past which has been discontinued.  On her last admission, she was started on Glucotrol.     Coronary artery disease: No anginal symptoms.  Continue beta-blocker   OSA: On CPAP at home.   Hypertension: Currently normotensive.  ARB discontinued due to presents with AKI .Her blood pressure has been stable without ARB.    Leukocytosis: resolved   Weakness: PT/OT consulted,no follow up recommended  Discharge Diagnoses:  Principal Problem:   Rapid atrial fibrillation (HCC) Active Problems:   Essential hypertension   Chronic diastolic heart failure (HCC)   COPD (chronic obstructive pulmonary disease) (HCC)   CAD (coronary artery disease)   Type 2 diabetes mellitus without complication, without long-term current use of insulin (HCC)   OSA on CPAP   Nausea vomiting and diarrhea   Acute renal failure superimposed on stage 3a chronic kidney disease (HCC)    Discharge Instructions  Discharge Instructions     Diet - low sodium heart healthy   Complete by: As directed    Discharge instructions   Complete by: As directed    1)Please take prescribed medications as instructed 2)Follow up with your PCP in a week   Increase activity slowly  Complete by: As directed       Allergies as of 08/20/2022       Reactions   Ciprofloxacin Other (See Comments)   SOB        Medication List     STOP taking these medications    irbesartan 75 MG tablet Commonly known as: AVAPRO   nitrofurantoin (macrocrystal-monohydrate) 100  MG capsule Commonly known as: Macrobid       TAKE these medications    azithromycin 500 MG tablet Commonly known as: Zithromax Take 1 tablet (500 mg total) by mouth daily for 5 days.   cyanocobalamin 1000 MCG tablet Commonly known as: VITAMIN B12 Take 1,000 mcg by mouth every other day.   diclofenac Sodium 1 % Gel Commonly known as: VOLTAREN Apply topically.   Farxiga 10 MG Tabs tablet Generic drug: dapagliflozin propanediol Take 10 mg by mouth daily.   fluconazole 150 MG tablet Commonly known as: DIFLUCAN Take 1 tab, repeat in 72 hours if no improvement.   fluticasone 50 MCG/ACT nasal spray Commonly known as: FLONASE Place 2 sprays into both nostrils daily.   glipiZIDE 5 MG tablet Commonly known as: GLUCOTROL Take 1 tablet (5 mg total) by mouth 2 (two) times daily before a meal.   methocarbamol 500 MG tablet Commonly known as: ROBAXIN Take 1 tablet (500 mg total) by mouth every 6 (six) hours as needed for muscle spasms.   metoprolol tartrate 100 MG tablet Commonly known as: LOPRESSOR TAKE 1 & 1/2 TABS (150MG ) BY MOUTH 2 (TWO) TIMES DAILY. What changed: See the new instructions.   nitroGLYCERIN 0.4 MG SL tablet Commonly known as: Nitrostat Place 1 tablet (0.4 mg total) under the tongue every 5 (five) minutes as needed.   oxyCODONE 5 MG immediate release tablet Commonly known as: Oxy IR/ROXICODONE Take 1 tablet (5 mg total) by mouth every 6 (six) hours as needed for severe pain.   pantoprazole 40 MG tablet Commonly known as: PROTONIX Take 1 tablet (40 mg total) by mouth daily.   Rivaroxaban 15 MG Tabs tablet Commonly known as: XARELTO Take 1 tablet (15 mg total) by mouth daily with supper.   torsemide 20 MG tablet Commonly known as: DEMADEX Take 2 tablets (40 mg) PO daily   Toviaz 4 MG Tb24 tablet Generic drug: fesoterodine Take 4 mg by mouth daily.   traZODone 50 MG tablet Commonly known as: DESYREL Take 1 tablet (50 mg total) by mouth at bedtime  as needed for sleep.   Vitamin D3 1000 units Caps Take by mouth.        Follow-up Information     [name], DO Follow up.   Specialty: Family Medicine Contact information: [address] [place] [address] [phone]                Allergies  Allergen Reactions   Ciprofloxacin Other (See Comments)    SOB    Consultations: None   Procedures/Studies: CT ABDOMEN PELVIS W CONTRAST  Result Date: 08/19/2022 CLINICAL DATA:  Abdominal pain, acute, nonlocalized. Pt is a 77 yo female with pmhx significant for HTN, chronic afib on Xarelto, CHF, GERD, vulvar cancer, sleep apnea, diverticulitis, and frequent UTIs. Pt was admitted from 5/7-5/10 for LLL pna EXAM: CT ABDOMEN AND PELVIS WITH CONTRAST TECHNIQUE: Multidetector CT imaging of the abdomen and pelvis was performed using the standard protocol following bolus administration of intravenous contrast. RADIATION DOSE REDUCTION: This exam was performed according to the departmental dose-optimization program which  includes automated exposure control, adjustment of the mA and/or kV according to patient size and/or use of iterative reconstruction technique. CONTRAST:  75mL OMNIPAQUE IOHEXOL 350 MG/ML SOLN COMPARISON:  CT renal 07/27/2022 FINDINGS: Lower chest: Coronary artery calcification.  Tiny hiatal hernia. Hepatobiliary: No focal liver abnormality. No gallstones, gallbladder wall thickening, or pericholecystic fluid. No biliary dilatation. Pancreas: No focal lesion. Normal pancreatic contour. No surrounding inflammatory changes. No main pancreatic ductal dilatation. Spleen: Normal in size without focal abnormality. Adrenals/Urinary Tract: No adrenal nodule bilaterally. Bilateral kidneys enhance symmetrically. Renal cortical scarring of the left kidney. No hydronephrosis. No hydroureter. Nonobstructive punctate calcifications of the left kidney. No right nephrolithiasis. No ureterolithiasis bilaterally.  The urinary bladder is unremarkable. On delayed imaging, there is no urothelial wall thickening and there are no filling defects in the opacified portions of the bilateral collecting systems or ureters. Stomach/Bowel: Stomach is within normal limits. No evidence of bowel wall thickening or dilatation. Colonic diverticulosis. Appendix appears normal. Vascular/Lymphatic: No abdominal aorta or iliac aneurysm. Severe atherosclerotic plaque of the aorta and its branches. No abdominal, pelvic, or inguinal lymphadenopathy. Reproductive: Status post hysterectomy. No adnexal masses. Other: No intraperitoneal free fluid. No intraperitoneal free gas. No organized fluid collection. Musculoskeletal: No abdominal wall hernia or abnormality. No suspicious lytic or blastic osseous lesions. No acute displaced fracture. Multilevel degenerative changes of the spine. IMPRESSION: 1. Nonobstructive punctate left nephrolithiasis. 2. Colonic diverticulosis with no acute diverticulitis. 3. Tiny hiatal hernia. Electronically Signed   By: Tish Frederickson M.D.   On: 08/19/2022 01:22   DG Chest Port 1 View  Result Date: 08/18/2022 CLINICAL DATA:  Shortness of breath. EXAM: PORTABLE CHEST 1 VIEW COMPARISON:  Jul 27, 2022 FINDINGS: The study is limited secondary to patient rotation. Cardiac silhouette is mildly enlarged and unchanged in size. There is marked severity calcification of the thoracic aorta. Mild, diffuse the increased interstitial lung markings are noted. There is no evidence of focal consolidation, pleural effusion or pneumothorax. Multilevel degenerative changes seen throughout the thoracic spine. IMPRESSION: Cardiomegaly with mild interstitial edema. Electronically Signed   By: Aram Candela M.D.   On: 08/18/2022 21:52   CT RENAL STONE STUDY  Result Date: 07/27/2022 CLINICAL DATA:  Abdominal/flank pain.  Stones suspected. EXAM: CT ABDOMEN AND PELVIS WITHOUT CONTRAST TECHNIQUE: Multidetector CT imaging of the abdomen and  pelvis was performed following the standard protocol without IV contrast. RADIATION DOSE REDUCTION: This exam was performed according to the departmental dose-optimization program which includes automated exposure control, adjustment of the mA and/or kV according to patient size and/or use of iterative reconstruction technique. COMPARISON:  CT abdomen and pelvis 12/13/2016 FINDINGS: Lower chest: Bibasilar atelectasis/scarring.  No acute abnormality. Hepatobiliary: Hepatic steatosis. Unremarkable gallbladder and biliary tree. Pancreas: Unremarkable. Spleen: Unremarkable. Adrenals/Urinary Tract: Stable adrenal glands. Contrast from prior CTA within the renal collecting systems and bladder. Cortical renal scarring bilaterally. No obstructing stone. No hydronephrosis. Unremarkable bladder. Stomach/Bowel: Normal caliber large and small bowel. Colonic diverticulosis. Hepatic steatosis. There is mild stranding about the descending colon which is decompressed. Unremarkable appendix. Stomach is within normal limits. Small hiatal hernia. Vascular/Lymphatic: Advanced aortic atherosclerotic calcification. No lymphadenopathy. Reproductive: Hysterectomy. Other: No free intraperitoneal fluid or air. Musculoskeletal: No acute fracture.  Thoracolumbar spondylosis. IMPRESSION: Possible mild descending colon diverticulitis/colitis Electronically Signed   By: Minerva Fester M.D.   On: 07/27/2022 22:11   CT Angio Chest PE W and/or Wo Contrast  Result Date: 07/27/2022 CLINICAL DATA:  Dyspnea/hypoxia PE suspected EXAM: CT ANGIOGRAPHY CHEST WITH CONTRAST  TECHNIQUE: Multidetector CT imaging of the chest was performed using the standard protocol during bolus administration of intravenous contrast. Multiplanar CT image reconstructions and MIPs were obtained to evaluate the vascular anatomy. RADIATION DOSE REDUCTION: This exam was performed according to the departmental dose-optimization program which includes automated exposure control,  adjustment of the mA and/or kV according to patient size and/or use of iterative reconstruction technique. CONTRAST:  60mL OMNIPAQUE IOHEXOL 350 MG/ML SOLN COMPARISON:  Chest radiographs 07/27/2022 and CTA chest 12/15/2016 FINDINGS: Cardiovascular: Satisfactory opacification of the pulmonary arteries to the segmental level. No pulmonary embolism. Coronary artery and aortic atherosclerotic calcification. Borderline cardiomegaly. No pericardial effusion. Mediastinum/Nodes: Pretracheal node measuring 1.1 cm subcentimeter subcarinal nodes. These are unchanged from 2018 and likely reactive. Small hiatal hernia. Lungs/Pleura: Respiratory motion obscures fine detail in the lung bases. Platelike consolidation in the lingula and left lower lobe. Mild pleural thickening in the posterior left lower lung. No pleural effusion. No pneumothorax. The central airways are patent. Upper Abdomen: No acute abnormality. Musculoskeletal: No acute fracture. Review of the MIP images confirms the above findings. IMPRESSION: 1. No evidence of acute pulmonary embolism. 2. Platelike consolidation in the lingula and left lower lobe, favor atelectasis. 3. Small hiatal hernia. 4. Coronary artery calcification. Aortic Atherosclerosis (ICD10-I70.0). Electronically Signed   By: Minerva Fester M.D.   On: 07/27/2022 20:10   DG Chest Port 1 View  Result Date: 07/27/2022 CLINICAL DATA:  Shortness of breath, chest pain EXAM: PORTABLE CHEST 1 VIEW COMPARISON:  03/18/2022 FINDINGS: Cardiomegaly. Heterogeneous opacity of the peripheral left lung base. The visualized skeletal structures are unremarkable. IMPRESSION: 1. Cardiomegaly. 2. Heterogeneous opacity of the peripheral left lung base, concerning for infection or aspiration. Consider PA and lateral radiographs or CT to further evaluate. Electronically Signed   By: Jearld Lesch M.D.   On: 07/27/2022 15:52      Subjective: Patient seen and examined the bedside today.  Hemodynamically stable.   Comfortable.  She feels better today.  Had a good sleep.  Yesterday she had 2 bowel movements which were formed.  Had a  loose bowel movement this morning.  Denies abdomen pain, nausea or vomiting.  Long discussion held at the bedside along with the granddaughter on phone about discharge planning  Discharge Exam: Vitals:   08/20/22 0700 08/20/22 0747  BP: 128/83 (!) 128/57  Pulse:  88  Resp:  18  Temp: 98.4 F (36.9 C) 98.5 F (36.9 C)  SpO2:  (!) 87%   Vitals:   08/19/22 1933 08/20/22 0017 08/20/22 0700 08/20/22 0747  BP: (!) 106/51 100/63 128/83 (!) 128/57  Pulse: 93 86  88  Resp: 20 20  18   Temp: (!) 97.4 F (36.3 C) (!) 97.2 F (36.2 C) 98.4 F (36.9 C) 98.5 F (36.9 C)  TempSrc: Oral Oral Oral Oral  SpO2: 95% 95%  (!) 87%  Weight:      Height:        General: Pt is alert, awake, not in acute distress.morbidly obese Cardiovascular: RRR, S1/S2 +, no rubs, no gallops Respiratory: CTA bilaterally, no wheezing, no rhonchi Abdominal: Soft, NT, ND, bowel sounds + Extremities: no edema, no cyanosis    The results of significant diagnostics from this hospitalization (including imaging, microbiology, ancillary and laboratory) are listed below for reference.     Microbiology: Recent Results (from the past 240 hour(s))  Urine Culture     Status: None   Collection Time: 08/17/22  5:01 PM   Specimen: Urine  Result Value  Ref Range Status   MICRO NUMBER: [id number]  Final   SPECIMEN QUALITY: Adequate  Final   Sample Source URINE, CLEAN CATCH  Final   STATUS: FINAL  Final   Result:   Final    Mixed genital flora isolated. These superficial bacteria are not indicative of a urinary tract infection. No further organism identification is warranted on this specimen. If clinically indicated, recollect clean-catch, mid-stream urine and transfer  immediately to Urine Culture Transport Tube.   Gastrointestinal Panel by PCR , Stool     Status: Abnormal   Collection Time: 08/19/22  3:58  AM   Specimen: Stool  Result Value Ref Range Status   Campylobacter species NOT DETECTED NOT DETECTED Final   Plesimonas shigelloides NOT DETECTED NOT DETECTED Final   Salmonella species NOT DETECTED NOT DETECTED Final   Yersinia enterocolitica NOT DETECTED NOT DETECTED Final   Vibrio species NOT DETECTED NOT DETECTED Final   Vibrio cholerae NOT DETECTED NOT DETECTED Final   Enteroaggregative E coli (EAEC) NOT DETECTED NOT DETECTED Final   Enteropathogenic E coli (EPEC) DETECTED (A) NOT DETECTED Final    Comment: RESULT CALLED TO, READ BACK BY AND VERIFIED WITH: UDORA CHERRY AT 1006 08/20/22.PMF    Enterotoxigenic E coli (ETEC) NOT DETECTED NOT DETECTED Final   Shiga like toxin producing E coli (STEC) NOT DETECTED NOT DETECTED Final   Shigella/Enteroinvasive E coli (EIEC) NOT DETECTED NOT DETECTED Final   Cryptosporidium NOT DETECTED NOT DETECTED Final   Cyclospora cayetanensis NOT DETECTED NOT DETECTED Final   Entamoeba histolytica NOT DETECTED NOT DETECTED Final   Giardia lamblia NOT DETECTED NOT DETECTED Final   Adenovirus F40/41 NOT DETECTED NOT DETECTED Final   Astrovirus NOT DETECTED NOT DETECTED Final   Norovirus GI/GII DETECTED (A) NOT DETECTED Final    Comment: RESULT CALLED TO, READ BACK BY AND VERIFIED WITH: UDORA CHERRY AT 1006 08/20/22.PMF    Rotavirus A NOT DETECTED NOT DETECTED Final   Sapovirus (I, II, IV, and V) NOT DETECTED NOT DETECTED Final    Comment: Performed at [facility], [address] [place], [place] [address]     Labs: BNP (last 3 results) Recent Labs    07/27/22 1544 08/18/22 2145  BNP 224.8* 125.1*   Basic Metabolic Panel: Recent Labs  Lab 08/18/22 2145 08/19/22 0805 08/20/22 0023  NA 139 134* 134*  K 4.0 4.6 3.7  CL 98 103 100  CO2 27 22 27   GLUCOSE 211* 220* 113*  BUN 22 23 20   CREATININE 1.51* 1.41* 1.25*  CALCIUM 9.4 8.6* 8.9  MG  --  2.1  --    Liver Function Tests: No results for input(s): "AST", "ALT",  "ALKPHOS", "BILITOT", "PROT", "ALBUMIN" in the last 168 hours. No results for input(s): "LIPASE", "AMYLASE" in the last 168 hours. No results for input(s): "AMMONIA" in the last 168 hours. CBC: Recent Labs  Lab 08/18/22 2145 08/19/22 0805 08/20/22 0023  WBC 15.4* 18.7* 9.8  NEUTROABS 12.8*  --   --   HGB 15.1* 13.4 12.4  HCT 47.9* 41.7 38.6  MCV 97.8 98.3 97.7  PLT 243 213 180   Cardiac Enzymes: No results for input(s): "CKTOTAL", "CKMB", "CKMBINDEX", "TROPONINI" in the last 168 hours. BNP: Invalid input(s): "POCBNP" CBG: Recent Labs  Lab 08/19/22 0520 08/19/22 1203 08/19/22 1548 08/19/22 2127 08/20/22 0614  GLUCAP 200* 165* 137* 159* 149*   D-Dimer No results for input(s): "DDIMER" in the last 72 hours. Hgb A1c No results for input(s): "HGBA1C" in  the last 72 hours. Lipid Profile No results for input(s): "CHOL", "HDL", "LDLCALC", "TRIG", "CHOLHDL", "LDLDIRECT" in the last 72 hours. Thyroid function studies No results for input(s): "TSH", "T4TOTAL", "T3FREE", "THYROIDAB" in the last 72 hours.  Invalid input(s): "FREET3" Anemia work up No results for input(s): "VITAMINB12", "FOLATE", "FERRITIN", "TIBC", "IRON", "RETICCTPCT" in the last 72 hours. Urinalysis    Component Value Date/Time   COLORURINE YELLOW 08/18/2022 2218   APPEARANCEUR HAZY (A) 08/18/2022 2218   APPEARANCEUR Cloudy (A) 12/22/2016 1158   LABSPEC 1.013 08/18/2022 2218   PHURINE 5.0 08/18/2022 2218   GLUCOSEU >=500 (A) 08/18/2022 2218   HGBUR NEGATIVE 08/18/2022 2218   BILIRUBINUR NEGATIVE 08/18/2022 2218   BILIRUBINUR negative 08/17/2022 1608   BILIRUBINUR Negative 12/22/2016 1158   KETONESUR NEGATIVE 08/18/2022 2218   PROTEINUR NEGATIVE 08/18/2022 2218   UROBILINOGEN 0.2 08/17/2022 1608   UROBILINOGEN 1.0 01/28/2015 2058   NITRITE NEGATIVE 08/18/2022 2218   LEUKOCYTESUR TRACE (A) 08/18/2022 2218   Sepsis Labs Recent Labs  Lab 08/18/22 2145 08/19/22 0805 08/20/22 0023  WBC 15.4* 18.7*  9.8   Microbiology Recent Results (from the past 240 hour(s))  Urine Culture     Status: None   Collection Time: 08/17/22  5:01 PM   Specimen: Urine  Result Value Ref Range Status   MICRO NUMBER: [id number]  Final   SPECIMEN QUALITY: Adequate  Final   Sample Source URINE, CLEAN CATCH  Final   STATUS: FINAL  Final   Result:   Final    Mixed genital flora isolated. These superficial bacteria are not indicative of a urinary tract infection. No further organism identification is warranted on this specimen. If clinically indicated, recollect clean-catch, mid-stream urine and transfer  immediately to Urine Culture Transport Tube.   Gastrointestinal Panel by PCR , Stool     Status: Abnormal   Collection Time: 08/19/22  3:58 AM   Specimen: Stool  Result Value Ref Range Status   Campylobacter species NOT DETECTED NOT DETECTED Final   Plesimonas shigelloides NOT DETECTED NOT DETECTED Final   Salmonella species NOT DETECTED NOT DETECTED Final   Yersinia enterocolitica NOT DETECTED NOT DETECTED Final   Vibrio species NOT DETECTED NOT DETECTED Final   Vibrio cholerae NOT DETECTED NOT DETECTED Final   Enteroaggregative E coli (EAEC) NOT DETECTED NOT DETECTED Final   Enteropathogenic E coli (EPEC) DETECTED (A) NOT DETECTED Final    Comment: RESULT CALLED TO, READ BACK BY AND VERIFIED WITH: UDORA CHERRY AT 1006 08/20/22.PMF    Enterotoxigenic E coli (ETEC) NOT DETECTED NOT DETECTED Final   Shiga like toxin producing E coli (STEC) NOT DETECTED NOT DETECTED Final   Shigella/Enteroinvasive E coli (EIEC) NOT DETECTED NOT DETECTED Final   Cryptosporidium NOT DETECTED NOT DETECTED Final   Cyclospora cayetanensis NOT DETECTED NOT DETECTED Final   Entamoeba histolytica NOT DETECTED NOT DETECTED Final   Giardia lamblia NOT DETECTED NOT DETECTED Final   Adenovirus F40/41 NOT DETECTED NOT DETECTED Final   Astrovirus NOT DETECTED NOT DETECTED Final   Norovirus GI/GII DETECTED (A) NOT DETECTED Final     Comment: RESULT CALLED TO, READ BACK BY AND VERIFIED WITH: UDORA CHERRY AT 1006 08/20/22.PMF    Rotavirus A NOT DETECTED NOT DETECTED Final   Sapovirus (I, II, IV, and V) NOT DETECTED NOT DETECTED Final    Comment: Performed at [facility], [address], [place], [place] [address]    Please note: You were cared for by a hospitalist during your hospital stay. Once  you are discharged, your primary care physician will handle any further medical issues. Please note that NO REFILLS for any discharge medications will be authorized once you are discharged, as it is imperative that you return to your primary care physician (or establish a relationship with a primary care physician if you do not have one) for your post hospital discharge needs so that they can reassess your need for medications and monitor your lab values.    Time coordinating discharge: 40 minutes  SIGNED:   Burnadette Pop, MD  [facility] 08/20/2022, 10:32 AM Pager [phone]  If 7PM-7AM, please contact night-coverage [email or web address] Password [email or web address]

## 2022-08-20 NOTE — Evaluation (Signed)
Physical Therapy Evaluation Patient Details Name: Holly Smith MRN: [id number] DOB: 08-01-1945 Today's Date: 08/20/2022  History of Present Illness  Pt is a 77 year old female admitted on 08/18/22 for N/VD, SOB, and palpitations. Past medical history significant for COPD, OSA, hypertension, type 2 diabetes mellitus, chronic HFpEF, atrial fibrillation on Xarelto, CKD 3A, vulvar cancer, and recent admission for pneumonia.  Clinical Impression  Pt presents with admitting diagnosis above. Pt was able to ambulate in hallway with no AD and navigate stairs independently. Pt presents at or near baseline mobility. Pt has no further acute PT needs and will be signing off. Re consult PT if mobility status changes.        Recommendations for follow up therapy are one component of a multi-disciplinary discharge planning process, led by the attending physician.  Recommendations may be updated based on patient status, additional functional criteria and insurance authorization.  Follow Up Recommendations       Assistance Recommended at Discharge PRN  Patient can return home with the following  Assist for transportation;Assistance with cooking/housework    Equipment Recommendations None recommended by PT  Recommendations for Other Services       Functional Status Assessment Patient has had a recent decline in their functional status and demonstrates the ability to make significant improvements in function in a reasonable and predictable amount of time.     Precautions / Restrictions Precautions Precautions: Fall;Other (comment) Precaution Comments: Enteric Restrictions Weight Bearing Restrictions: No      Mobility  Bed Mobility               General bed mobility comments: Pt seated EOB    Transfers Overall transfer level: Independent Equipment used: None Transfers: Sit to/from Stand Sit to Stand: Independent                Ambulation/Gait Ambulation/Gait assistance:  Modified independent (Device/Increase time) Gait Distance (Feet): 400 Feet Assistive device: None, 1 person hand held assist Gait Pattern/deviations: Step-through pattern, Decreased stride length Gait velocity: decreased     General Gait Details: no LOB noted. Pt requested 1 person HHA once fatigued. States this is baseline for her.  Stairs Stairs: Yes Stairs assistance: Supervision Stair Management: Two rails, Forwards, Step to pattern Number of Stairs: 4 General stair comments: no LOB noted  Wheelchair Mobility    Modified Rankin (Stroke Patients Only)       Balance Overall balance assessment: Mild deficits observed, not formally tested                                           Pertinent Vitals/Pain Pain Assessment Pain Assessment: No/denies pain    Home Living Family/patient expects to be discharged to:: Private residence Living Arrangements: Children (Son) Available Help at Discharge: Family;Available PRN/intermittently Type of Home: Mobile home Home Access: Stairs to enter Entrance Stairs-Rails: Right;Left;Can reach both Entrance Stairs-Number of Steps: 4   Home Layout: One level Home Equipment: Rollator (4 wheels);Cane - single point;Grab bars - toilet;Grab bars - tub/shower;Hand held shower head;Adaptive equipment;Electric scooter;Other (comment) (CPAP)      Prior Function Prior Level of Function : Independent/Modified Independent;Driving             Mobility Comments: Ambulates in the home without AD, uses SPC, holds to son's arm, or scooter for community mobility, denies falls. Hx of BPPV. ADLs Comments: independent ADLs, IADLs, med  mgmt     Hand Dominance   Dominant Hand: Right    Extremity/Trunk Assessment   Upper Extremity Assessment Upper Extremity Assessment: Overall WFL for tasks assessed    Lower Extremity Assessment Lower Extremity Assessment: Overall WFL for tasks assessed    Cervical / Trunk  Assessment Cervical / Trunk Assessment: Normal  Communication   Communication: No difficulties  Cognition Arousal/Alertness: Awake/alert Behavior During Therapy: WFL for tasks assessed/performed Overall Cognitive Status: Within Functional Limits for tasks assessed                                          General Comments General comments (skin integrity, edema, etc.): SpO2 remained above 92% on RA during ambulation.    Exercises     Assessment/Plan    PT Assessment Patient does not need any further PT services  PT Problem List Decreased strength;Decreased activity tolerance;Decreased mobility;Decreased safety awareness;Cardiopulmonary status limiting activity       PT Treatment Interventions      PT Goals (Current goals can be found in the Care Plan section)       Frequency       Co-evaluation               AM-PAC PT "6 Clicks" Mobility  Outcome Measure Help needed turning from your back to your side while in a flat bed without using bedrails?: None Help needed moving from lying on your back to sitting on the side of a flat bed without using bedrails?: None Help needed moving to and from a bed to a chair (including a wheelchair)?: None Help needed standing up from a chair using your arms (e.g., wheelchair or bedside chair)?: None Help needed to walk in hospital room?: None Help needed climbing 3-5 steps with a railing? : A Little 6 Click Score: 23    End of Session Equipment Utilized During Treatment: Gait belt Activity Tolerance: Patient tolerated treatment well Patient left: in bed;with call bell/phone within reach;Other (comment) (MD present) Nurse Communication: Mobility status PT Visit Diagnosis: Muscle weakness (generalized) (M62.81);Difficulty in walking, not elsewhere classified (R26.2);Unsteadiness on feet (R26.81)    Time: 0454-0981 PT Time Calculation (min) (ACUTE ONLY): 21 min   Charges:   PT Evaluation $PT Eval Low  Complexity: 1 Low PT Treatments $Gait Training: 8-22 mins        Holly Smith, PT, DPT Acute Rehab Services [phone]   Holly Smith 08/20/2022, 10:56 AM

## 2022-08-23 ENCOUNTER — Telehealth: Payer: Self-pay

## 2022-08-25 ENCOUNTER — Ambulatory Visit: Payer: Medicare HMO | Admitting: Family Medicine

## 2022-08-25 ENCOUNTER — Encounter: Payer: Self-pay | Admitting: Family Medicine

## 2022-08-25 ENCOUNTER — Ambulatory Visit (INDEPENDENT_AMBULATORY_CARE_PROVIDER_SITE_OTHER): Payer: Medicare HMO | Admitting: Family Medicine

## 2022-08-25 VITALS — BP 120/80 | HR 59 | Temp 97.7°F | Ht 60.0 in | Wt 197.2 lb

## 2022-08-25 DIAGNOSIS — N179 Acute kidney failure, unspecified: Secondary | ICD-10-CM

## 2022-08-25 DIAGNOSIS — A09 Infectious gastroenteritis and colitis, unspecified: Secondary | ICD-10-CM | POA: Diagnosis not present

## 2022-08-25 NOTE — Patient Instructions (Signed)
Give [place] 2-3 business days to get the results of your labs back.   Keep the diet clean and stay active.  We are done with that antibiotic/nitrofurantoin. Stay on the azithromycin.  Let us know if you need anything.

## 2022-08-25 NOTE — Transitions of Care (Post Inpatient/ED Visit) (Signed)
08/25/2022  Name: Holly Smith MRN: [id number] DOB: [birth year]  Today's TOC FU Call Status: Today's TOC FU Call Status:: Successful TOC FU Call Competed TOC FU Call Complete Date: 08/25/22  Transition Care Management Follow-up Telephone Call Discharge Facility: [facility]) Type of Discharge: Inpatient Admission Primary Inpatient Discharge Diagnosis:: A-fib Reason for ED Visit: Cardiac Conditions Cardiac Conditions Diagnosis: Atrial Fibrillation How have you been since you were released from the hospital?: Better Any questions or concerns?: No  Items Reviewed: Did you receive and understand the discharge instructions provided?: Yes Medications obtained,verified, and reconciled?: Yes (Medications Reviewed) Any new allergies since your discharge?: No Dietary orders reviewed?: No Do you have support at home?: Yes People in Home: spouse  Medications Reviewed Today: Medications Reviewed Today     Reviewed by [name], DO (Physician) on 08/25/22 at 1134  Med List Status: <None>   Medication Order Taking? Sig Documenting Provider Last Dose Status Informant  azithromycin (ZITHROMAX) 500 MG tablet [id number] Yes Take 1 tablet (500 mg total) by mouth daily for 5 days. Burnadette Pop, MD Taking Active   Cholecalciferol (VITAMIN D3) 1000 units CAPS [id number] Yes Take by mouth. [provider] Taking Active Pharmacy Records, Self, Other  diclofenac Sodium (VOLTAREN) 1 % GEL [id number] Yes Apply topically. [provider] Taking Active Pharmacy Records, Self, Other  FARXIGA 10 MG TABS tablet [id number] Yes Take 10 mg by mouth daily. [provider] Taking Active Pharmacy Records, Self, Other  fluconazole (DIFLUCAN) 150 MG tablet [id number] Yes Take 1 tab, repeat in 72 hours if no improvement. [name], DO Taking Active Self, Pharmacy Records, Other  fluticasone [facility]) 50 MCG/ACT nasal spray [id number] Yes Place 2 sprays into both  nostrils daily. Bennie Pierini, FNP Taking Active Pharmacy Records, Self, Other  glipiZIDE (GLUCOTROL) 5 MG tablet [id number] Yes Take 1 tablet (5 mg total) by mouth 2 (two) times daily before a meal. Wendling, [name], DO Taking Active Self, Pharmacy Records, Other  methocarbamol (ROBAXIN) 500 MG tablet [id number] Yes Take 1 tablet (500 mg total) by mouth every 6 (six) hours as needed for muscle spasms. Lonia Blood, MD Taking Active Self, Pharmacy Records, Other  metoprolol tartrate (LOPRESSOR) 100 MG tablet [id number] Yes TAKE 1 & 1/2 TABS (150MG ) BY MOUTH 2 (TWO) TIMES DAILY.  Patient taking differently: Take 100 mg by mouth 2 (two) times daily.   Corky Crafts, MD Taking Active Pharmacy Records, Self, Other  nitroGLYCERIN (NITROSTAT) 0.4 MG SL tablet [id number] Yes Place 1 tablet (0.4 mg total) under the tongue every 5 (five) minutes as needed. Arty Baumgartner, NP Taking Active Pharmacy Records, Self, Other  oxyCODONE (OXY IR/ROXICODONE) 5 MG immediate release tablet [id number] Yes Take 1 tablet (5 mg total) by mouth every 6 (six) hours as needed for severe pain. Lonia Blood, MD Taking Active Self, Pharmacy Records, Other  pantoprazole (PROTONIX) 40 MG tablet [id number] Yes Take 1 tablet (40 mg total) by mouth daily. Corky Crafts, MD Taking Active Pharmacy Records, Self, Other    Discontinued 01/17/19 [phone] (Discontinued by provider) Rivaroxaban (XARELTO) 15 MG TABS tablet [id number] Yes Take 1 tablet (15 mg total) by mouth daily with supper. Corky Crafts, MD Taking Active Pharmacy Records, Self, Other  torsemide [facility]) 20 MG tablet [id number] Yes Take 2 tablets (40 mg) PO daily Lonia Blood, MD Taking Active Self, Pharmacy Records, Other  TOVIAZ 4 MG TB24 tablet [id number] Yes Take 4 mg by mouth daily. [provider] Taking Active Pharmacy Records, Self, Other  traZODone (DESYREL) 50 MG tablet [id number] Yes Take 1 tablet (50 mg total) by  mouth at bedtime as needed for sleep. [name], DO Taking Active Pharmacy Records, Self, Other  vitamin B-12 (CYANOCOBALAMIN) 1000 MCG tablet [id number] Yes Take 1,000 mcg by mouth every other day.  [provider] Taking Active Pharmacy Records, Self, Other            Home Care and Equipment/Supplies: Were Home Health Services Ordered?: No Any new equipment or medical supplies ordered?: No  Functional Questionnaire: Do you need assistance with bathing/showering or dressing?: No Do you need assistance with meal preparation?: No Do you need assistance with eating?: No Do you have difficulty maintaining continence: No Do you need assistance with getting out of bed/getting out of a chair/moving?: No Do you have difficulty managing or taking your medications?: No  Follow up appointments reviewed: PCP Follow-up appointment confirmed?: NA Date of PCP follow-up appointment?: 08/25/22 [facility]-up Provider[facility] Follow-up appointment confirmed?: NA Do you need transportation to your follow-up appointment?: No Do you understand care options if your condition(s) worsen?: Yes-patient verbalized understanding    SIGNATURE Fredirick Maudlin

## 2022-08-25 NOTE — Progress Notes (Signed)
Chief Complaint  Patient presents with   Hospitalization Follow-up    Subjective: Patient is a 77 y.o. female here for hospital follow-up.  Patient was admitted to the hospital on 08/18/2022 and discharged on 08/20/2022.  She was admitted for acute kidney injury, A-fib with RVR, and infectious diarrhea.  Stool culture grew enteropathogenic E. coli and norovirus.  She is currently taking azithromycin 500 mg for total of 5 days.  She has 3 more days left.  She is eating and drinking normally.  Abdominal pain is improving.  Having some loose stools since being on the antibiotic but otherwise doing well.  She denies any fevers, nausea, vomiting, or poor appetite.  Past Medical History:  Diagnosis Date   A-fib (HCC)    Abnormal Pap smear of vagina and vaginal HPV    Acute diastolic CHF (congestive heart failure) (HCC) 01/21/2015   Acute diverticulitis 01/28/2015   Acute on chronic diastolic heart failure (HCC) 01/21/2015   Anticoagulation adequate 08/02/2014   Overview:  Xarelto 20hs; CHADS=1+ htn, female, age  Overview:  Xarelto 20hs; CHADS=1+ htn, female, age   Arthritis    "bones ache" (01/23/2015)   Atrial fibrillation, persistent (HCC) 08/13/2015   Overview:  symptomatic (fatigue, DOE, decreased exercise tolerance, palpitations), drug refractory (propafenone, amiodarone), complicated recovery from DCCV possibly related to repiratory obstruction [facility] (patient fear of DCCV)  Overview:  symptomatic (fatigue, DOE, decreased exercise tolerance, palpitations), drug refractory (propafenone, amiodarone), complicated recovery from DCCV possibly relat   Benign neoplasm of ascending colon    Benign neoplasm of descending colon    Benign neoplasm of sigmoid colon    Blood in stool 01/21/2015   Breath shortness 08/20/2013   CAFL (chronic airflow limitation) (HCC) 12/16/2014   Cancer (HCC)    vulvar cancer   CCF (congestive cardiac failure) (HCC) 08/03/2013   Overview:  ACUTE SYSTOLIC    Chest pain  08/20/2013   CHF (congestive heart failure) (HCC)    CHF (congestive heart failure) (HCC)    Chronic anticoagulation    She is on Xarelto for Afib   Chronic atrial fibrillation (HCC)    Chronic combined systolic and diastolic CHF, NYHA class 3 (HCC) 01/28/2015   Chronic diastolic heart failure (HCC) 05/05/2015   Condyloma acuminatum of vulva 08/02/2014   COPD (chronic obstructive pulmonary disease) (HCC) 08/13/2015   Diverticulitis large intestine w/o perforation or abscess w/bleeding 01/28/2015   Diverticulitis of large intestine without perforation or abscess with bleeding    Dysmetabolic syndrome 12/16/2014   Overview:  BG-166 06/15/2013; elevated BMI; central obesity    Dyspnea    Echocardiogram abnormal    Mitral regurgitation, tricuspic regurgitation   Essential hypertension 10/07/2014   Trial off acei 10/07/2014  Due to atypical sob (talking = walking)> no real change 11/18/2014     Family history of adverse reaction to anesthesia    sister gets very nauseated and vomits   Gastritis without bleeding    GERD (gastroesophageal reflux disease)    History of kidney stones    History of stress test    Myoview 8/16: EF 42%, anteroseptal, inferoseptal, anterior, apical anterior and apical defect (likely represents breast attenuation versus scar), no ischemia; intermediate risk because of low EF   HPV (human papilloma virus) anogenital infection    03/21/12 PAP + HR HPV   HTN (hypertension) 12/16/2014   Hypertension    Hypertensive heart disease 05/12/2016   Hypertensive urgency 01/21/2015   Insulin resistance    Kidney stones  Malignant neoplasm of vulva (HCC) 06/19/2014   Overview:  Ms ASYAH HUGUENIN is a 77 yo who was referred by Dr Feliberto Gottron for a vulvar lesion. 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 The specimen had negative margins, but was within 2 mm of the cancer.     Mitral regurgitation  04/23/2013   Overview:  "Moderate to severe" by TTE, "mild to mod" by TEE  Overview:  "Moderate to severe" by TTE, "mild to mod" by TEE   Obesity    OSA (obstructive sleep apnea) 11/27/2014   07/2013 RDI 17/h CPAP 8    Other specified diseases of esophagus    Postoperative nausea and vomiting 1983   many years ago with hysterectomy   PVC (premature ventricular contraction) 12/16/2014   TI (tricuspid incompetence) 04/23/2013   Overview:  "moderate" by TTE    Urolithiasis    Vertigo    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 History of genital warts and vulvar condyloma   Vulvar cancer, carcinoma (HCC) 05/10/2014    Objective: BP 120/80 (BP Location: Left Arm, Patient Position: Sitting, Cuff Size: Normal)   Pulse (!) 59   Temp 97.7 F (36.5 C) (Oral)   Ht 5' (1.524 m)   Wt 197 lb 4 oz (89.5 kg)   SpO2 97%   BMI 38.52 kg/m  General: Awake, appears stated age Heart: RRR, no LE edema Lungs: CTAB, no rales, wheezes or rhonchi. No accessory muscle use Abdomen: Bowel sounds present, soft, nondistended, TTP in lower quadrants bilaterally Psych: Age appropriate judgment and insight, normal affect and mood  Assessment and Plan: Acute kidney injury (HCC) - Plan: CBC, Basic metabolic panel  Infectious diarrhea  Follow-up on labs.  Probably improved.  She is eating and drinking more normally. Continue Azithromycin 500 mg/d, 2/5 today. Has Diflucan for home should she develop a yeast infection.  The patient voiced understanding and agreement to the plan.  I spent 30 min w the pt discussing the above plans and reviewing her hospitalization on the same day of the visit.   [name] [place], DO 08/25/22  11:53 AM

## 2022-08-26 DIAGNOSIS — G4733 Obstructive sleep apnea (adult) (pediatric): Secondary | ICD-10-CM | POA: Diagnosis not present

## 2022-09-06 ENCOUNTER — Other Ambulatory Visit: Payer: Self-pay | Admitting: Interventional Cardiology

## 2022-09-06 ENCOUNTER — Other Ambulatory Visit: Payer: Self-pay | Admitting: Family Medicine

## 2022-09-06 DIAGNOSIS — E1165 Type 2 diabetes mellitus with hyperglycemia: Secondary | ICD-10-CM

## 2022-09-09 ENCOUNTER — Encounter: Payer: Self-pay | Admitting: Family Medicine

## 2022-09-09 ENCOUNTER — Other Ambulatory Visit: Payer: Self-pay | Admitting: Interventional Cardiology

## 2022-09-10 ENCOUNTER — Other Ambulatory Visit: Payer: Self-pay | Admitting: Family Medicine

## 2022-09-10 MED ORDER — TOVIAZ 4 MG PO TB24: 4.0000 mg | ORAL_TABLET | Freq: Every day | ORAL | 5 refills | Status: DC

## 2022-09-10 MED ORDER — FARXIGA 10 MG PO TABS: 10.0000 mg | ORAL_TABLET | Freq: Every day | ORAL | 5 refills | Status: DC

## 2022-09-10 NOTE — Telephone Encounter (Signed)
Pharmacy comment: Alternative Requested:[employer] WILL NOT COVER FOR MED, REQUESTING ALTS THAT INS WILL  COVER FOR.   All Pharmacy Suggested Alternatives:  oxybutynin (DITROPAN-XL) 5 MG 24 hr tablet solifenacin (VESICARE) 5 MG tablet trospium (SANCTURA) 20 MG tablet tolterodine (DETROL) 1 MG tablet

## 2022-09-25 DIAGNOSIS — G4733 Obstructive sleep apnea (adult) (pediatric): Secondary | ICD-10-CM | POA: Diagnosis not present

## 2022-10-04 ENCOUNTER — Other Ambulatory Visit: Payer: Self-pay | Admitting: Family Medicine

## 2022-10-11 ENCOUNTER — Other Ambulatory Visit: Payer: Self-pay | Admitting: Interventional Cardiology

## 2022-10-12 ENCOUNTER — Encounter: Payer: Self-pay | Admitting: Neurology

## 2022-10-12 ENCOUNTER — Ambulatory Visit (INDEPENDENT_AMBULATORY_CARE_PROVIDER_SITE_OTHER): Payer: Medicare HMO | Admitting: Neurology

## 2022-10-12 ENCOUNTER — Telehealth: Payer: Self-pay | Admitting: Family Medicine

## 2022-10-12 VITALS — BP 139/72 | HR 81 | Resp 15 | Ht 60.0 in | Wt 197.3 lb

## 2022-10-12 DIAGNOSIS — G4733 Obstructive sleep apnea (adult) (pediatric): Secondary | ICD-10-CM

## 2022-10-12 NOTE — Telephone Encounter (Signed)
Received Testing Supply form from [employer] Called the patient to confirm she does want to use this company Had to leave a msg. To call back.

## 2022-10-12 NOTE — Patient Instructions (Signed)
Keep up the good work with CPAP! Continue nightly use for > 4 hours I will send order to DME to continue supplies See you back in 1 year

## 2022-10-12 NOTE — Telephone Encounter (Signed)
Spoke to the patient and she does want to receive her supplies through this company Completed and faxed to [employer].

## 2022-10-12 NOTE — Progress Notes (Signed)
Patient: Holly Smith Date of Birth: [birth year]  Reason for Visit: Follow up History from: Patient Primary Neurologist: Dohmeier  ASSESSMENT AND PLAN 77 y.o. year old female   1.  OSA on CPAP -Encouraged to continue nightly usage for a minimum of 4 hours.  Experiencing excellent subjective benefit. -Send order to DME for continued settings, supplies as needed.  Commended on superb compliance. -Follow-up virtually in 1 year or sooner if needed  HISTORY OF PRESENT ILLNESS: Today 10/12/22 Here for initial CPAP.  Evaluated initially by Dr. Vickey Huger March 2024 with history of OSA on CPAP and symptomatic of frequent nighttime waking and daytime somnolence.  HST April 2024 showed moderate to severe OSA with clinically significant hypoxia. ESS 9. Using nasal pillow mask. She is using a chin strap, is going to get a new one, was hurting her ears, saw a new one on Facebook to try. Likes her new CPAP machine (was using old machine). Much more rested during the day, sleeping better, sleeps through the night. Setup date 07/26/22. Has been admitted since for AFIB.  Review of CPAP data 09/02/2022-10/01/2022 showed usage 28/30 days at 93%.  Greater than 4 hours 26 days at 87%.  Average usage days used 6 hours 58 minutes. 6-12 centimeters water.  EPR level 3.  Leak 15.5.  AHI 1.6.  Calculated pAHI (per hour):    28.2/h by AASM criteria, and 15.3/h by CMS criteria.    No central events were recorded.                      REM pAHI:     47.1/h                                            NREM pAHI:   23.7/h    HISTORY  05/25/22 Dr. Vickey Huger : Soleia Badolato is a 77 y.o. female OSA and A fib patient who is seen upon referral on 05/25/2022 from [facility] for a new sleep evaluation.  Chief concern according to patient :  I need a new CPAP    I have the pleasure of seeing Holly Smith 05/25/22 a right-handed female with a  sleep disorder. She had a sleep study in 2015 at [facility] ,  and had a CPAP titration , too. She dislikes FFM. She developed atrial fib and failed 2 cardioversion, but she did not reach sustained sinus rhythm.  Finally she saw Dr Clarene Duke, Cardiology. He referred for ablation, and she saw Dr Johney Frame who stated it won't work. She continues to use CPAP, she is using a REM Star pro - at least 79 years old and on recall. Nuance pro  nasal pillows.  She needs a new machine :   Summary of her 2 sleep studies:  Sleep study #1 took place on 5 - 5 - 15 the interpreting physician was Dr. Marcello Fennel.  The patient slept 342 minutes with a sleep efficiency of 83% 16.8 minutes sleep latency REM latency 82 minutes.  Remarkably this patient had 34% REM sleep.  She reached an AHI of 14.5/h her apnea was worse when she slept on the left than when she slept supine which is remarkable.  Her REM AHI was 32/h and NREM AHI was 5.6/h.  So this is clearly REM sleep dependent sleep apnea and apparently obstructive hypopnea in origin as there  were very few frank apnea seen.  Oxygen saturation at nadir was 80% and total time in oxygen desaturation under 90% was 12.6% of total sleep time.  Cardiac arrhythmia was detected.  She did not have PLM arousals.  The minimum heart rate was 69 and the maximum heart rate was 104 bpm.   This was followed by a CPAP trial on 08-02-13, sleep efficiency was now only 72% for CPAP titration.  Sleep latency was 46 minutes.  REM sleep latency was 124 minutes.  She still reached 35% REM sleep.  She had PLM's but did not wake up from them.  And she was titrated to 8 cm water pressure.  Her AHI was 0.5/h her REM AHI was 0.9/h her supine AHI was 1.8/h.  Again she had no frank apneas but lots of hypopneas.   The titration was successful.     CPAP compliance data :  She was then issued a [employer] REM star machine which is set at 8 cm water pressure with 2 cm expiratory relief and allows for an average AHI residual of 1.8/h.  So this is a very successful treatment and  she has been highly compliant over the last 30 days she has used his antique CPAP machine 28 out of 30 days over 4 hours and 100% of days.  Very little air leak is noted she is using nasal pillows the so-called new walls Pro gel.  Her DME was [employer].   Sleep relevant medical history: Nocturia 2 r times, 3 and 6 AM,  Dream enactment , Tonsillectomy, at age [age]    Family medical /sleep history: one son on CPAP with OSA.   Social history:  Patient is  retired from [employer] and lives in a household with her son,  her daughter in law died of Covid and they moved together.  Family status is widowed , with 1 adult son , 1 granddaughter.   Pets are present. 2 dogs.  Tobacco use yes, until 2014.  ETOH use ; none,  Caffeine intake in form of Coffee( 1 cup in AM ) Soda( 2-3 a day) Tea ( /) or energy drinks Exercise in form of ; none.    Sleep habits are as follows: The patient's dinner time is between 6 PM. The patient goes to bed at 11 PM and continues to sleep for 7 hours, wakes for 2 bathroom breaks, the first time at 3 AM.   The preferred sleep position is on the left side, supine , with the support of 2 pillows.  Dreams are reportedly  frequent/vivid before she got on medication.   The patient wakes up spontaneously 6-7 . 9.30  AM is the usual rise time. She reports not feeling refreshed or restored in AM, with symptoms such as dry mouth, morning headaches, and residual fatigue.  Naps are taken infrequently, in front of the TV- lasting from 15 to 30 minutes and are refreshing .  REVIEW OF SYSTEMS: Out of a complete 14 system review of symptoms, the patient complains only of the following symptoms, and all other reviewed systems are negative.  See HPI  ALLERGIES: Allergies  Allergen Reactions   Ciprofloxacin Other (See Comments)    SOB   Macrobid [Nitrofurantoin] Shortness Of Breath    HOME MEDICATIONS: Outpatient Medications Prior to Visit  Medication Sig Dispense Refill    Cholecalciferol (VITAMIN D3) 1000 units CAPS Take by mouth.     diclofenac Sodium (VOLTAREN) 1 % GEL Apply topically.  FARXIGA 10 MG TABS tablet Take 1 tablet (10 mg total) by mouth daily. 30 tablet 5   fluconazole (DIFLUCAN) 150 MG tablet Take 1 tab, repeat in 72 hours if no improvement. 2 tablet 0   fluticasone (FLONASE) 50 MCG/ACT nasal spray Place 2 sprays into both nostrils daily. 16 g 6   glipiZIDE (GLUCOTROL) 5 MG tablet TAKE 1 TABLET (5 MG TOTAL) BY MOUTH TWICE A DAY BEFORE MEALS 180 tablet 1   methocarbamol (ROBAXIN) 500 MG tablet Take 1 tablet (500 mg total) by mouth every 6 (six) hours as needed for muscle spasms. 20 tablet 0   metoprolol tartrate (LOPRESSOR) 100 MG tablet TAKE 1 & 1/2 TABS (150MG ) BY MOUTH 2 (TWO) TIMES DAILY. (Patient taking differently: Take 100 mg by mouth 2 (two) times daily.) 270 tablet 2   nitroGLYCERIN (NITROSTAT) 0.4 MG SL tablet Place 1 tablet (0.4 mg total) under the tongue every 5 (five) minutes as needed. 25 tablet 2   oxybutynin (DITROPAN-XL) 5 MG 24 hr tablet Take 1 tablet (5 mg total) by mouth at bedtime. 30 tablet 5   oxyCODONE (OXY IR/ROXICODONE) 5 MG immediate release tablet Take 1 tablet (5 mg total) by mouth every 6 (six) hours as needed for severe pain. 10 tablet 0   pantoprazole (PROTONIX) 40 MG tablet TAKE 1 TABLET BY MOUTH EVERY DAY 90 tablet 2   torsemide (DEMADEX) 20 MG tablet TAKE 2 TABLETS (40 MG) BY MOUTH DAILY 180 tablet 0   traZODone (DESYREL) 50 MG tablet Take 1 tablet (50 mg total) by mouth at bedtime as needed for sleep. 90 tablet 2   vitamin B-12 (CYANOCOBALAMIN) 1000 MCG tablet Take 1,000 mcg by mouth every other day.      XARELTO 15 MG TABS tablet TAKE 1 TABLET (15 MG TOTAL) BY MOUTH DAILY WITH SUPPER 90 tablet 3   No facility-administered medications prior to visit.    PAST MEDICAL HISTORY: Past Medical History:  Diagnosis Date   A-fib (HCC)    Abnormal Pap smear of vagina and vaginal HPV    Acute diastolic CHF (congestive  heart failure) (HCC) 01/21/2015   Acute diverticulitis 01/28/2015   Acute on chronic diastolic heart failure (HCC) 01/21/2015   Anticoagulation adequate 08/02/2014   Overview:  Xarelto 20hs; CHADS=1+ htn, female, age  Overview:  Xarelto 20hs; CHADS=1+ htn, female, age   Arthritis    "bones ache" (01/23/2015)   Atrial fibrillation, persistent (HCC) 08/13/2015   Overview:  symptomatic (fatigue, DOE, decreased exercise tolerance, palpitations), drug refractory (propafenone, amiodarone), complicated recovery from DCCV possibly related to repiratory obstruction [facility] (patient fear of DCCV)  Overview:  symptomatic (fatigue, DOE, decreased exercise tolerance, palpitations), drug refractory (propafenone, amiodarone), complicated recovery from DCCV possibly relat   Benign neoplasm of ascending colon    Benign neoplasm of descending colon    Benign neoplasm of sigmoid colon    Blood in stool 01/21/2015   Breath shortness 08/20/2013   CAFL (chronic airflow limitation) (HCC) 12/16/2014   Cancer (HCC)    vulvar cancer   CCF (congestive cardiac failure) (HCC) 08/03/2013   Overview:  ACUTE SYSTOLIC    Chest pain 08/20/2013   CHF (congestive heart failure) (HCC)    CHF (congestive heart failure) (HCC)    Chronic anticoagulation    She is on Xarelto for Afib   Chronic atrial fibrillation (HCC)    Chronic combined systolic and diastolic CHF, NYHA class 3 (HCC) 01/28/2015   Chronic diastolic heart failure (HCC) 05/05/2015   Condyloma  acuminatum of vulva 08/02/2014   COPD (chronic obstructive pulmonary disease) (HCC) 08/13/2015   Diverticulitis large intestine w/o perforation or abscess w/bleeding 01/28/2015   Diverticulitis of large intestine without perforation or abscess with bleeding    Dysmetabolic syndrome 12/16/2014   Overview:  BG-166 06/15/2013; elevated BMI; central obesity    Dyspnea    Echocardiogram abnormal    Mitral regurgitation, tricuspic regurgitation   Essential hypertension  10/07/2014   Trial off acei 10/07/2014  Due to atypical sob (talking = walking)> no real change 11/18/2014     Family history of adverse reaction to anesthesia    sister gets very nauseated and vomits   Gastritis without bleeding    GERD (gastroesophageal reflux disease)    History of kidney stones    History of stress test    Myoview 8/16: EF 42%, anteroseptal, inferoseptal, anterior, apical anterior and apical defect (likely represents breast attenuation versus scar), no ischemia; intermediate risk because of low EF   HPV (human papilloma virus) anogenital infection    03/21/12 PAP + HR HPV   HTN (hypertension) 12/16/2014   Hypertension    Hypertensive heart disease 05/12/2016   Hypertensive urgency 01/21/2015   Insulin resistance    Kidney stones    Malignant neoplasm of vulva (HCC) 06/19/2014   Overview:  Ms INES REBEL is a 77 yo who was referred by Dr Feliberto Gottron for a vulvar lesion. 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 The specimen had negative margins, but was within 2 mm of the cancer.     Mitral regurgitation 04/23/2013   Overview:  "Moderate to severe" by TTE, "mild to mod" by TEE  Overview:  "Moderate to severe" by TTE, "mild to mod" by TEE   Obesity    OSA (obstructive sleep apnea) 11/27/2014   07/2013 RDI 17/h CPAP 8    Other specified diseases of esophagus    Postoperative nausea and vomiting 1983   many years ago with hysterectomy   PVC (premature ventricular contraction) 12/16/2014   TI (tricuspid incompetence) 04/23/2013   Overview:  "moderate" by TTE    Urolithiasis    Vertigo    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 History of genital warts and vulvar condyloma   Vulvar cancer, carcinoma (HCC) 05/10/2014    PAST SURGICAL HISTORY: Past Surgical History:  Procedure Laterality Date   ABDOMINAL HYSTERECTOMY     APPENDECTOMY     BILATERAL  SALPINGOOPHORECTOMY  2003   benign ovarian cancer   CARDIOVERSION  2014-2015   "[facility]"   COLONOSCOPY WITH PROPOFOL N/A 06/10/2015   Procedure: COLONOSCOPY WITH PROPOFOL;  Surgeon: Midge Minium, MD;  Location: [facility] ENDOSCOPY;  Service: Endoscopy;  Laterality: N/A;   CORONARY STENT INTERVENTION N/A 01/18/2019   Procedure: CORONARY STENT INTERVENTION;  Surgeon: Corky Crafts, MD;  Location: [facility] INVASIVE CV LAB;  Service: Cardiovascular;  Laterality: N/A;   CYSTOSCOPY W/ RETROGRADES Bilateral 10/19/2016   Procedure: CYSTOSCOPY WITH RETROGRADE PYELOGRAM;  Surgeon: Vanna Scotland, MD;  Location: [facility] ORS;  Service: Urology;  Laterality: Bilateral;   CYSTOSCOPY W/ URETERAL STENT PLACEMENT Left 12/29/2016   Procedure: CYSTOSCOPY WITH STENT REPLACEMENT;  Surgeon: Vanna Scotland, MD;  Location: [facility] ORS;  Service: Urology;  Laterality: Left;   CYSTOSCOPY WITH BIOPSY N/A 10/19/2016   Procedure: CYSTOSCOPY WITH BLADDER BIOPSY, VAGINAL WALL BIOPSY;  Surgeon: Vanna Scotland, MD;  Location: [facility] ORS;  Service: Urology;  Laterality: N/A;   CYSTOSCOPY WITH STENT PLACEMENT Left 12/14/2016   Procedure: CYSTOSCOPY WITH STENT PLACEMENT;  Surgeon: Vanna Scotland, MD;  Location: [facility] ORS;  Service: Urology;  Laterality: Left;   ESOPHAGOGASTRODUODENOSCOPY (EGD) WITH PROPOFOL N/A 10/05/2016   Surgeon: Midge Minium, MD;  Location: [facility] ENDOSCOPY;  Results: Barrett's Esophagus- repeat in 3 years 09/2019   LEFT HEART CATH AND CORONARY ANGIOGRAPHY N/A 01/18/2019   Procedure: LEFT HEART CATH AND CORONARY ANGIOGRAPHY;  Surgeon: Corky Crafts, MD;  Location: [facility] INVASIVE CV LAB;  Service: Cardiovascular;  Laterality: N/A;   LITHOTRIPSY     STONE EXTRACTION WITH BASKET Left 12/29/2016   Procedure: STONE EXTRACTION WITH BASKET;  Surgeon: Vanna Scotland, MD;  Location: [facility] ORS;  Service: Urology;  Laterality: Left;   TONSILLECTOMY     URETEROSCOPY Left 12/29/2016   Procedure: URETEROSCOPY;  Surgeon:  Vanna Scotland, MD;  Location: [facility] ORS;  Service: Urology;  Laterality: Left;   VULVECTOMY Right 05/10/2014   Excisional biopsy of the superior right labial majora mass; [facility]   VULVECTOMY PARTIAL  07/02/2014   Re-excision and sentinel node dissection at [facility].     FAMILY HISTORY: Family History  Problem Relation Age of Onset   Prostate cancer Brother 60       still living and well   Heart attack Father    Stroke Mother    Diabetes Mother    Hypertension Mother    Heart attack Brother    Pancreatic cancer Brother    Heart disease Sister    Breast cancer Neg Hx    Kidney cancer Neg Hx    Bladder Cancer Neg Hx     SOCIAL HISTORY: Social History   Socioeconomic History   Marital status: Divorced    Spouse name: Not on file   Number of children: 1   Years of education: GED   Highest education level: 12th grade  Occupational History   Occupation: Retired  Tobacco Use   Smoking status: Former    Current packs/day: 0.00    Average packs/day: 1 pack/day for 50.0 years (50.0 ttl pk-yrs)    Types: Cigarettes    Start date: 05/21/1962    Quit date: 05/20/2012    Years since quitting: 10.4   Smokeless tobacco: Never   Tobacco comments:    smoking cessation materials not required  Vaping Use   Vaping status: Never Used  Substance and Sexual Activity   Alcohol use: No    Alcohol/week: 0.0 standard drinks of alcohol   Drug use: No   Sexual activity: Not Currently  Other Topics Concern   Not on file  Social History Narrative   Not on file   Social Determinants of Health   Financial Resource Strain: Low Risk  (06/28/2022)   Overall Financial Resource Strain (CARDIA)    Difficulty of Paying Living Expenses: Not hard at all  Food Insecurity: No Food Insecurity (07/28/2022)   Hunger Vital Sign    Worried About Running Out of Food in the Last Year: Never true    Ran Out of Food in the Last Year: Never true  Transportation Needs: No Transportation Needs (07/28/2022)    PRAPARE - Administrator, Civil Service (Medical): No    Lack of Transportation (Non-Medical): No  Physical Activity: Insufficiently Active (06/28/2022)   Exercise Vital Sign    Days of Exercise per Week: 2 days    Minutes of Exercise per Session: 20 min  Stress: No Stress Concern Present (06/28/2022)  [employer] of Occupational Health - Occupational Stress Questionnaire    Feeling of Stress : Not at all  Social Connections: Unknown (06/28/2022)   Social Connection and Isolation Panel [NHANES]    Frequency of Communication with Friends and Family: More than three times a week    Frequency of Social Gatherings with Friends and Family: More than three times a week    Attends Religious Services: Not on file    Active Member of Clubs or Organizations: No    Attends Banker Meetings: Never    Marital Status: Widowed  Intimate Partner Violence: Not At Risk (07/28/2022)   Humiliation, Afraid, Rape, and Kick questionnaire    Fear of Current or Ex-Partner: No    Emotionally Abused: No    Physically Abused: No    Sexually Abused: No   PHYSICAL EXAM  Vitals:   10/12/22 1253  BP: 139/72  Pulse: 81  Resp: 15  Weight: 197 lb 5 oz (89.5 kg)  Height: 5' (1.524 m)   Body mass index is 38.53 kg/m.  Generalized: Well developed, in no acute distress  Neurological examination  Mentation: Alert oriented to time, place, history taking. Follows all commands speech and language fluent Cranial nerve II-XII: Pupils were equal round reactive to light. Extraocular movements were full, visual field were full on confrontational test. Facial sensation and strength were normal. Head turning and shoulder shrug  were normal and symmetric. Motor: The motor testing reveals 5 over 5 strength of all 4 extremities. Good symmetric motor tone is noted throughout.  Sensory: Sensory testing is intact to soft touch on all 4 extremities. No evidence of extinction is noted.  Coordination:  Cerebellar testing reveals good finger-nose-finger bilaterally Gait and station: Gait is normal.    DIAGNOSTIC DATA (LABS, IMAGING, TESTING) - I reviewed patient records, labs, notes, testing and imaging myself where available.  Lab Results  Component Value Date   WBC 9.8 08/20/2022   HGB 12.4 08/20/2022   HCT 38.6 08/20/2022   MCV 97.7 08/20/2022   PLT 180 08/20/2022      Component Value Date/Time   NA 134 (L) 08/20/2022 0023   NA 138 03/26/2020 0000   NA 138 06/23/2013 0425   K 3.7 08/20/2022 0023   K 3.8 06/23/2013 0425   CL 100 08/20/2022 0023   CL 101 06/23/2013 0425   CO2 27 08/20/2022 0023   CO2 32 06/23/2013 0425   GLUCOSE 113 (H) 08/20/2022 0023   GLUCOSE 130 (H) 06/23/2013 0425   BUN 20 08/20/2022 0023   BUN 30 (H) 03/26/2020 0000   BUN 24 (H) 06/23/2013 0425   CREATININE 1.25 (H) 08/20/2022 0023   CREATININE 1.07 (H) 02/07/2015 1204   CALCIUM 8.9 08/20/2022 0023   CALCIUM 9.0 06/23/2013 0425   PROT 6.2 (L) 07/29/2022 0808   PROT 7.4 11/09/2017 1111   ALBUMIN 2.5 (L) 07/29/2022 0808   ALBUMIN 4.2 11/09/2017 1111   AST 14 (L) 07/29/2022 0808   ALT 17 07/29/2022 0808   ALKPHOS 40 07/29/2022 0808   BILITOT 1.0 07/29/2022 0808   BILITOT 0.5 11/09/2017 1111   GFRNONAA 44 (L) 08/20/2022 0023   GFRNONAA 53 (L) 06/23/2013 0425   GFRAA 33 (L) 03/26/2020 0000   GFRAA >60 06/23/2013 0425   Lab Results  Component Value Date   CHOL 206 (H) 03/26/2020   HDL 32 (L) 03/26/2020   LDLCALC 112 (H) 03/26/2020   TRIG 358 (H) 03/26/2020   CHOLHDL 6.4 (H) 03/26/2020  Lab Results  Component Value Date   HGBA1C 7.8 (H) 07/28/2022   Lab Results  Component Value Date   VITAMINB12 362 01/21/2015   Lab Results  Component Value Date   TSH 1.470 11/09/2017    Margie Ege, AGNP-C, DNP 10/12/2022, 1:15 PM [facility] [address] [place], [place] [address] [phone]

## 2022-10-12 NOTE — Progress Notes (Signed)
Orders sent via community message

## 2022-10-19 NOTE — Addendum Note (Signed)
Addended by: Glean Salvo on: 10/19/2022 07:32 AM   Modules accepted: Level of Service

## 2022-10-20 ENCOUNTER — Encounter (INDEPENDENT_AMBULATORY_CARE_PROVIDER_SITE_OTHER): Payer: Self-pay

## 2022-10-26 DIAGNOSIS — G4733 Obstructive sleep apnea (adult) (pediatric): Secondary | ICD-10-CM | POA: Diagnosis not present

## 2022-11-03 ENCOUNTER — Encounter: Payer: Self-pay | Admitting: Family Medicine

## 2022-11-03 ENCOUNTER — Ambulatory Visit (INDEPENDENT_AMBULATORY_CARE_PROVIDER_SITE_OTHER): Payer: Medicare HMO | Admitting: Family Medicine

## 2022-11-03 VITALS — BP 130/74 | HR 56 | Temp 98.6°F | Ht 60.0 in | Wt 197.4 lb

## 2022-11-03 DIAGNOSIS — I8392 Asymptomatic varicose veins of left lower extremity: Secondary | ICD-10-CM

## 2022-11-03 DIAGNOSIS — Z7984 Long term (current) use of oral hypoglycemic drugs: Secondary | ICD-10-CM

## 2022-11-03 DIAGNOSIS — Z1159 Encounter for screening for other viral diseases: Secondary | ICD-10-CM

## 2022-11-03 DIAGNOSIS — E1165 Type 2 diabetes mellitus with hyperglycemia: Secondary | ICD-10-CM | POA: Diagnosis not present

## 2022-11-03 DIAGNOSIS — N3281 Overactive bladder: Secondary | ICD-10-CM

## 2022-11-03 LAB — GLUCOSE, POCT (MANUAL RESULT ENTRY): POC Glucose: 162 mg/dl — AB (ref 70–99)

## 2022-11-03 MED ORDER — OXYBUTYNIN CHLORIDE ER 10 MG PO TB24: 10.0000 mg | ORAL_TABLET | Freq: Every day | ORAL | 1 refills | Status: DC

## 2022-11-03 MED ORDER — GLIPIZIDE 10 MG PO TABS
10.0000 mg | ORAL_TABLET | Freq: Two times a day (BID) | ORAL | 3 refills | Status: DC
Start: 2022-11-03 — End: 2022-11-03

## 2022-11-03 MED ORDER — FARXIGA 10 MG PO TABS: 10.0000 mg | ORAL_TABLET | Freq: Every day | ORAL | 3 refills | Status: DC

## 2022-11-03 MED ORDER — GLIPIZIDE 10 MG PO TABS: 10.0000 mg | ORAL_TABLET | Freq: Two times a day (BID) | ORAL | 3 refills | Status: DC

## 2022-11-03 NOTE — Addendum Note (Signed)
Addended by: Scharlene Gloss B on: 11/03/2022 04:08 PM   Modules accepted: Orders

## 2022-11-03 NOTE — Progress Notes (Signed)
Subjective:   Chief Complaint  Patient presents with   Follow-up    3 month    Holly Smith is a 77 y.o. female here for follow-up of diabetes.   Miyana's self monitored glucose range is mid-high 100's.  Patient denies hypoglycemic reactions. She checks her glucose levels 2 time(s) per day. Patient does not require insulin.   Medications include: Farxiga 10 mg/d, glipizide 10 mg bid Did not tolerate GLP-1  Diet is OK.  Exercise: walking No Cp or SOB.   OAB Taking Ditropan XL 5 mg/d. Reports compliance, no AE's nighttime symptoms are better, still urinating freq throughout the day. Interested in increasing the dosage. No pain, dc, bleeding, constipation.   Past Medical History:  Diagnosis Date   Abnormal Pap smear of vagina and vaginal HPV    Anticoagulation adequate 08/02/2014   Overview:  Xarelto 20hs; CHADS=1+ htn, female, age  Overview:  Xarelto 20hs; CHADS=1+ htn, female, age   Arthritis    "bones ache" (01/23/2015)   Atrial fibrillation, persistent (HCC) 08/13/2015   Overview:  symptomatic (fatigue, DOE, decreased exercise tolerance, palpitations), drug refractory (propafenone, amiodarone), complicated recovery from DCCV possibly related to repiratory obstruction [facility] (patient fear of DCCV)  Overview:  symptomatic (fatigue, DOE, decreased exercise tolerance, palpitations), drug refractory (propafenone, amiodarone), complicated recovery from DCCV possibly relat   Benign neoplasm of ascending colon    Benign neoplasm of descending colon    Benign neoplasm of sigmoid colon    Breath shortness 08/20/2013   CHF (congestive heart failure) (HCC)    Chronic atrial fibrillation (HCC)    Chronic combined systolic and diastolic CHF, NYHA class 3 (HCC) 01/28/2015   COPD (chronic obstructive pulmonary disease) (HCC) 08/13/2015   Essential hypertension 10/07/2014   Trial off acei 10/07/2014  Due to atypical sob (talking = walking)> no real change 11/18/2014     Family history of  adverse reaction to anesthesia    sister gets very nauseated and vomits   GERD (gastroesophageal reflux disease)    History of kidney stones    HPV (human papilloma virus) anogenital infection    03/21/12 PAP + HR HPV   Mitral regurgitation 04/23/2013   Overview:  "Moderate to severe" by TTE, "mild to mod" by TEE  Overview:  "Moderate to severe" by TTE, "mild to mod" by TEE   Obesity    OSA (obstructive sleep apnea) 11/27/2014   07/2013 RDI 17/h CPAP 8    Other specified diseases of esophagus    Postoperative nausea and vomiting 1983   many years ago with hysterectomy   PVC (premature ventricular contraction) 12/16/2014   TI (tricuspid incompetence) 04/23/2013   Overview:  "moderate" by TTE    Urolithiasis    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 History of genital warts and vulvar condyloma     Related testing: Retinal exam: Done Pneumovax: done  Objective:  BP 130/74 (BP Location: Left Arm, Cuff Size: Normal)   Pulse (!) 56   Temp 98.6 F (37 C) (Oral)   Ht 5' (1.524 m)   Wt 197 lb 6 oz (89.5 kg)   SpO2 93%   BMI 38.55 kg/m  General:  Well developed, well nourished, in no apparent distress  Lungs:  CTAB, no access msc use Cardio: Regular rhythm, bradycardic, no bruits, no LE edema Psych: Age appropriate judgment and insight  Assessment:   Type 2 diabetes mellitus with hyperglycemia, without long-term current use of insulin (HCC) - Plan:  Hemoglobin A1c  Overactive bladder  Asymptomatic varicose veins of left lower extremity  Encounter for hepatitis C screening test for low risk patient - Plan: Hepatitis C antibody   Plan:   Chronic, unsure if stable.  For now, continue glipizide 10 mg twice daily, Farxiga 10 mg daily.  She failed metformin and GLP-1's due to GI adverse effects.  Could consider Actos but will have to run this by cardiology given history of heart failure.  Also could consider low-dose of insulin.  Counseled on diet  and exercise. Chronic, not controlled.  Increase Ditropan XL from 5 mg daily to 10 mg daily. F/u in 1 mo. The patient voiced understanding and agreement to the plan.  [name] [place], DO 11/03/22 4:04 PM

## 2022-11-03 NOTE — Patient Instructions (Addendum)
Give [place] 2-3 business days to get the results of your labs back.   Keep the diet clean and stay active.  I recommend getting the flu shot in mid October. This suggestion would change if the CDC comes out with a different recommendation.   Consider getting your tetanus booster at the pharmacy.   Consider compression stockings.   Let us know if you need anything.

## 2022-11-04 ENCOUNTER — Encounter (INDEPENDENT_AMBULATORY_CARE_PROVIDER_SITE_OTHER): Payer: Self-pay

## 2022-11-04 ENCOUNTER — Telehealth: Payer: Self-pay | Admitting: Family Medicine

## 2022-11-04 NOTE — Telephone Encounter (Signed)
Per lab staff, labs were not collected at visit yesterday.  Labs changed to future, please schedule lab visit.

## 2022-11-04 NOTE — Telephone Encounter (Signed)
Pt's granddaughter called to find out if we did a A1C yesterday as pt is adamant she received this yesterday but no results in chart. I do not see that A1C labs were collected. Please update order to future labs and advise so we can get her scheduled.

## 2022-11-04 NOTE — Addendum Note (Signed)
Addended by: Alysia Penna on: 11/04/2022 04:54 PM   Modules accepted: Orders

## 2022-11-10 ENCOUNTER — Other Ambulatory Visit (INDEPENDENT_AMBULATORY_CARE_PROVIDER_SITE_OTHER): Payer: Medicare HMO

## 2022-11-10 DIAGNOSIS — E1165 Type 2 diabetes mellitus with hyperglycemia: Secondary | ICD-10-CM | POA: Diagnosis not present

## 2022-11-10 DIAGNOSIS — Z1159 Encounter for screening for other viral diseases: Secondary | ICD-10-CM

## 2022-11-10 LAB — HEPATITIS C ANTIBODY: Hepatitis C Ab: NONREACTIVE

## 2022-11-11 ENCOUNTER — Other Ambulatory Visit: Payer: Self-pay | Admitting: Family Medicine

## 2022-11-11 ENCOUNTER — Encounter: Payer: Self-pay | Admitting: Family Medicine

## 2022-11-11 LAB — HEMOGLOBIN A1C: Hgb A1c MFr Bld: 7.9 % — ABNORMAL HIGH (ref 4.6–6.5)

## 2022-11-11 MED ORDER — RYBELSUS 3 MG PO TABS: 3.0000 mg | ORAL_TABLET | Freq: Every day | ORAL | 0 refills | Status: DC

## 2022-11-26 DIAGNOSIS — G4733 Obstructive sleep apnea (adult) (pediatric): Secondary | ICD-10-CM | POA: Diagnosis not present

## 2022-12-01 ENCOUNTER — Encounter: Payer: Self-pay | Admitting: Family Medicine

## 2022-12-03 ENCOUNTER — Ambulatory Visit (INDEPENDENT_AMBULATORY_CARE_PROVIDER_SITE_OTHER): Payer: Medicare HMO | Admitting: Family Medicine

## 2022-12-03 ENCOUNTER — Encounter: Payer: Self-pay | Admitting: Family Medicine

## 2022-12-03 VITALS — BP 138/82 | HR 55 | Temp 98.0°F | Ht 60.0 in | Wt 199.0 lb

## 2022-12-03 DIAGNOSIS — N3281 Overactive bladder: Secondary | ICD-10-CM | POA: Diagnosis not present

## 2022-12-03 DIAGNOSIS — R300 Dysuria: Secondary | ICD-10-CM

## 2022-12-03 LAB — POC URINALSYSI DIPSTICK (AUTOMATED)
Bilirubin, UA: NEGATIVE
Blood, UA: NEGATIVE
Glucose, UA: NEGATIVE
Ketones, UA: NEGATIVE
Nitrite, UA: NEGATIVE
Protein, UA: NEGATIVE
Spec Grav, UA: 1.01 (ref 1.010–1.025)
Urobilinogen, UA: 0.2 U/dL
pH, UA: 5 (ref 5.0–8.0)

## 2022-12-03 MED ORDER — CEFDINIR 300 MG PO CAPS: 300.0000 mg | ORAL_CAPSULE | Freq: Two times a day (BID) | ORAL | 0 refills | Status: AC

## 2022-12-03 MED ORDER — RYBELSUS 7 MG PO TABS: 7.0000 mg | ORAL_TABLET | Freq: Every day | ORAL | 2 refills | Status: DC

## 2022-12-03 MED ORDER — TOLTERODINE TARTRATE ER 2 MG PO CP24: 2.0000 mg | ORAL_CAPSULE | Freq: Every day | ORAL | 1 refills | Status: DC

## 2022-12-03 NOTE — Progress Notes (Unsigned)
Chief Complaint  Patient presents with   Follow-up   Dysuria    Holly Smith is a 77 y.o. female here for possible UTI.  Duration: 1 week. Symptoms: Dysuria, urinary frequency and urgency Denies: hematuria, urinary hesitancy, urinary retention, fever, nausea, and vomiting, vaginal discharge Hx of recurrent UTI? Yes Denies new sexual partners.  OAB Started on Ditropan that did not particularly help. Did well on tolterodine in the past and is requesting to go back on that.   Past Medical History:  Diagnosis Date   Abnormal Pap smear of vagina and vaginal HPV    Anticoagulation adequate 08/02/2014   Overview:  Xarelto 20hs; CHADS=1+ htn, female, age  Overview:  Xarelto 20hs; CHADS=1+ htn, female, age   Arthritis    "bones ache" (01/23/2015)   Atrial fibrillation, persistent (HCC) 08/13/2015   Overview:  symptomatic (fatigue, DOE, decreased exercise tolerance, palpitations), drug refractory (propafenone, amiodarone), complicated recovery from DCCV possibly related to repiratory obstruction [facility] (patient fear of DCCV)  Overview:  symptomatic (fatigue, DOE, decreased exercise tolerance, palpitations), drug refractory (propafenone, amiodarone), complicated recovery from DCCV possibly relat   Benign neoplasm of ascending colon    Benign neoplasm of descending colon    Benign neoplasm of sigmoid colon    Breath shortness 08/20/2013   CHF (congestive heart failure) (HCC)    Chronic atrial fibrillation (HCC)    Chronic combined systolic and diastolic CHF, NYHA class 3 (HCC) 01/28/2015   COPD (chronic obstructive pulmonary disease) (HCC) 08/13/2015   Essential hypertension 10/07/2014   Trial off acei 10/07/2014  Due to atypical sob (talking = walking)> no real change 11/18/2014     Family history of adverse reaction to anesthesia    sister gets very nauseated and vomits   GERD (gastroesophageal reflux disease)    History of kidney stones    HPV (human papilloma virus) anogenital  infection    03/21/12 PAP + HR HPV   Mitral regurgitation 04/23/2013   Overview:  "Moderate to severe" by TTE, "mild to mod" by TEE  Overview:  "Moderate to severe" by TTE, "mild to mod" by TEE   Obesity    OSA (obstructive sleep apnea) 11/27/2014   07/2013 RDI 17/h CPAP 8    Other specified diseases of esophagus    Postoperative nausea and vomiting 1983   many years ago with hysterectomy   PVC (premature ventricular contraction) 12/16/2014   TI (tricuspid incompetence) 04/23/2013   Overview:  "moderate" by TTE    Urolithiasis    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 History of genital warts and vulvar condyloma     BP 138/82 (BP Location: Left Arm, Patient Position: Sitting, Cuff Size: Normal)   Pulse (!) 55   Temp 98 F (36.7 C) (Oral)   Ht 5' (1.524 m)   Wt 199 lb (90.3 kg)   SpO2 97%   BMI 38.86 kg/m  General: Awake, alert, appears stated age Heart: RRR Lungs: CTAB, normal respiratory effort, no accessory muscle usage Abd: BS+, soft, NT, ND, no masses or organomegaly MSK: No CVA tenderness, neg Lloyd's sign Psych: Age appropriate judgment and insight  Overactive bladder  Dysuria - Plan: POCT Urinalysis Dipstick (Automated), Urine Culture  Stay hydrated. Seek immediate care if pt starts to develop fevers, new/worsening symptoms, uncontrollable N/V. F/u prn. The patient voiced understanding and agreement to the plan.  [name] [place], DO 12/03/22 3:57 PM

## 2022-12-03 NOTE — Patient Instructions (Signed)
Stay hydrated.   Warning signs/symptoms: Uncontrollable nausea/vomiting, fevers, worsening symptoms despite treatment, confusion.  Give [place] around 2 business days to get culture back to you.  Let me know if there are cost issues on the urination medication.   Let us know if you need anything.

## 2022-12-04 LAB — URINE CULTURE
MICRO NUMBER:: 15463999
Result:: NO GROWTH
SPECIMEN QUALITY:: ADEQUATE

## 2022-12-07 NOTE — Progress Notes (Unsigned)
Cardiology Office Note   Date:  12/08/2022   ID:  Holly Smith 03-31-1945, MRN [id number]  PCP:  [name], DO    No chief complaint on file.  CAD  Wt Readings from Last 3 Encounters:  12/08/22 197 lb (89.4 kg)  12/03/22 199 lb (90.3 kg)  11/03/22 197 lb 6 oz (89.5 kg)       History of Present Illness: Holly Smith is a 77 y.o. female  Who has chronic atrial fibrillation that is now rate controlled and h/o tachycardia mediated cardiomyopathy in 2015.  She was cardioverted at [facility]. EF later improved but she has chronic diastolic dysfunction. Last echo was in 2016. EF was 55-60%. Mild MR noted. She is on Xarelto for a/c.  She went back into atrial fibrillation several years ago which is now chronic.  Previously seen by Dr. Johney Frame and not felt to be a good candidate for ablation.  She has failed propafenone  and amiodarone in the past.  She is on metoprolol for rate control. No h/o CAD. Other medical problems include HTN, HLD and obesity.   In 2019: "We discussed weight loss surgery referral.  She had a friend who died many years ago during weight loss surgery so she is hesitant.  She prefers the Atkins diet to try to lose weight.  She has done well with this in the past. "   She developed symptoms concerning for coronary artery disease and underwent cardiac catheterization in October 2020 showing: "  Prox Cx lesion is 50% stenosed. More distal mid LAD-1 lesion is 80% stenosed. A drug-eluting stent was successfully placed using a STENT SYNERGY DES 2.25X28. Post intervention, there is a 0% residual stenosis. More proximal mid LAD-2 lesion is 80% stenosed. A drug-eluting stent was successfully placed using a STENT SYNERGY DES 2.75X20 postdilated to 3.0 mm. Post intervention, there is a 0% residual stenosis. Mid RCA lesion is 25% stenosed. Acute Mrg lesion is 75% stenosed. This is a small vessel. RPDA lesion is 70% stenosed. This is a small  vessel. The left ventricular systolic function is normal. LV end diastolic pressure is moderately elevated. LVEDP 26 mm Hg. The left ventricular ejection fraction is 55-65% by visual estimate. There is no aortic valve stenosis.   Restart Xarelto tomorrow.  Continue Plavix 75 mg daily in addition to Xarelto.  To reduce bleeding risk, will hold off on continuing aspirin. "   After the catheterization, she lost weight.    At the 5/21 visit:, "she had stopped her cholesterol medicine due to cramps in her hands and feet.  She also reports that she falls asleep easily during the day when she is not trying to.  She is not interested in the Covid vaccine."  Crestor was added back 3x/week.  She is taking it 1-2x/week.  She was diagnosed with DM.  Metformin was started.  Also added [place].   She developed COVID in mid December and was treated with monoclonal antibody.  She was unvaccinated.   Dizziness prompted ER visit on 03/17/20.  Head and neck CT showed: "No hemodynamically significant stenosis, large vessel cut or aneurysms in intracranial circulation 2. No hemodynamically significant stenosis, dissection or aneurysms in extracranial circulation. 3. Scattered patchy opacities in visualized upper lungs may represent infiltrates. Please correlate with signs and symptoms. 4. Incidental note is made of soft tissue prominence at the base of tongue that is abutting the anterior aspect of epiglottis (image 247 series 7). Differential diagnoses  include enlarged lingual tonsils or possibly base of tongue mass. Further workup/follow-up and direct visualization can be considered."  AFib was stable.   Given Bactrim for cystitis and meclizine for dizziness.    Diuretic was reduced in January 2022 due to dizziness and to prevent dehydration.   She went back to the two pills after having volume overload.  SHe was hospitalized at [facility]. She tested positive for COVID at that time as well.   Doing well since the  last visit.    Denies : Chest pain. Dizziness. Leg edema. Nitroglycerin use. Orthopnea. Paroxysmal nocturnal dyspnea. Shortness of breath. Syncope.    Rare palpitations, only when it is time for the next dose.   Balance has been issue.  Using a cane. No falls. No bleeding issues.    Past Medical History:  Diagnosis Date   Abnormal Pap smear of vagina and vaginal HPV    Anticoagulation adequate 08/02/2014   Overview:  Xarelto 20hs; CHADS=1+ htn, female, age  Overview:  Xarelto 20hs; CHADS=1+ htn, female, age   Arthritis    "bones ache" (01/23/2015)   Atrial fibrillation, persistent (HCC) 08/13/2015   Overview:  symptomatic (fatigue, DOE, decreased exercise tolerance, palpitations), drug refractory (propafenone, amiodarone), complicated recovery from DCCV possibly related to repiratory obstruction [facility] (patient fear of DCCV)  Overview:  symptomatic (fatigue, DOE, decreased exercise tolerance, palpitations), drug refractory (propafenone, amiodarone), complicated recovery from DCCV possibly relat   Benign neoplasm of ascending colon    Benign neoplasm of descending colon    Benign neoplasm of sigmoid colon    Breath shortness 08/20/2013   CHF (congestive heart failure) (HCC)    Chronic atrial fibrillation (HCC)    Chronic combined systolic and diastolic CHF, NYHA class 3 (HCC) 01/28/2015   COPD (chronic obstructive pulmonary disease) (HCC) 08/13/2015   Essential hypertension 10/07/2014   Trial off acei 10/07/2014  Due to atypical sob (talking = walking)> no real change 11/18/2014     Family history of adverse reaction to anesthesia    sister gets very nauseated and vomits   GERD (gastroesophageal reflux disease)    History of kidney stones    HPV (human papilloma virus) anogenital infection    03/21/12 PAP + HR HPV   Mitral regurgitation 04/23/2013   Overview:  "Moderate to severe" by TTE, "mild to mod" by TEE  Overview:  "Moderate to severe" by TTE, "mild to mod" by TEE   Obesity     OSA (obstructive sleep apnea) 11/27/2014   07/2013 RDI 17/h CPAP 8    Other specified diseases of esophagus    Postoperative nausea and vomiting 1983   many years ago with hysterectomy   PVC (premature ventricular contraction) 12/16/2014   TI (tricuspid incompetence) 04/23/2013   Overview:  "moderate" by TTE    Urolithiasis    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 History of genital warts and vulvar condyloma    Past Surgical History:  Procedure Laterality Date   ABDOMINAL HYSTERECTOMY     APPENDECTOMY     BILATERAL SALPINGOOPHORECTOMY  2003   benign ovarian cancer   CARDIOVERSION  2014-2015   "[facility]"   COLONOSCOPY WITH PROPOFOL N/A 06/10/2015   Procedure: COLONOSCOPY WITH PROPOFOL;  Surgeon: Midge Minium, MD;  Location: [facility] ENDOSCOPY;  Service: Endoscopy;  Laterality: N/A;   CORONARY STENT INTERVENTION N/A 01/18/2019   Procedure: CORONARY STENT INTERVENTION;  Surgeon: Corky Crafts, MD;  Location: [facility] INVASIVE CV LAB;  Service: Cardiovascular;  Laterality: N/A;   CYSTOSCOPY W/ RETROGRADES Bilateral 10/19/2016   Procedure: CYSTOSCOPY WITH RETROGRADE PYELOGRAM;  Surgeon: Vanna Scotland, MD;  Location: [facility] ORS;  Service: Urology;  Laterality: Bilateral;   CYSTOSCOPY W/ URETERAL STENT PLACEMENT Left 12/29/2016   Procedure: CYSTOSCOPY WITH STENT REPLACEMENT;  Surgeon: Vanna Scotland, MD;  Location: [facility] ORS;  Service: Urology;  Laterality: Left;   CYSTOSCOPY WITH BIOPSY N/A 10/19/2016   Procedure: CYSTOSCOPY WITH BLADDER BIOPSY, VAGINAL WALL BIOPSY;  Surgeon: Vanna Scotland, MD;  Location: [facility] ORS;  Service: Urology;  Laterality: N/A;   CYSTOSCOPY WITH STENT PLACEMENT Left 12/14/2016   Procedure: CYSTOSCOPY WITH STENT PLACEMENT;  Surgeon: Vanna Scotland, MD;  Location: [facility] ORS;  Service: Urology;  Laterality: Left;   ESOPHAGOGASTRODUODENOSCOPY (EGD) WITH PROPOFOL N/A 10/05/2016   Surgeon: Midge Minium, MD;  Location: [facility] ENDOSCOPY;   Results: Barrett's Esophagus- repeat in 3 years 09/2019   LEFT HEART CATH AND CORONARY ANGIOGRAPHY N/A 01/18/2019   Procedure: LEFT HEART CATH AND CORONARY ANGIOGRAPHY;  Surgeon: Corky Crafts, MD;  Location: [facility] INVASIVE CV LAB;  Service: Cardiovascular;  Laterality: N/A;   LITHOTRIPSY     STONE EXTRACTION WITH BASKET Left 12/29/2016   Procedure: STONE EXTRACTION WITH BASKET;  Surgeon: Vanna Scotland, MD;  Location: [facility] ORS;  Service: Urology;  Laterality: Left;   TONSILLECTOMY     URETEROSCOPY Left 12/29/2016   Procedure: URETEROSCOPY;  Surgeon: Vanna Scotland, MD;  Location: [facility] ORS;  Service: Urology;  Laterality: Left;   VULVECTOMY Right 05/10/2014   Excisional biopsy of the superior right labial majora mass; [facility]   VULVECTOMY PARTIAL  07/02/2014   Re-excision and sentinel node dissection at [facility]      Current Outpatient Medications  Medication Sig Dispense Refill   cefdinir (OMNICEF) 300 MG capsule Take 1 capsule (300 mg total) by mouth 2 (two) times daily for 7 days. 14 capsule 0   Cholecalciferol (VITAMIN D3) 1000 units CAPS Take by mouth.     FARXIGA 10 MG TABS tablet Take 1 tablet (10 mg total) by mouth daily. 90 tablet 3   fluticasone (FLONASE) 50 MCG/ACT nasal spray Place 2 sprays into both nostrils daily. 16 g 6   glipiZIDE (GLUCOTROL) 10 MG tablet Take 1 tablet (10 mg total) by mouth 2 (two) times daily before a meal. 180 tablet 3   irbesartan (AVAPRO) 75 MG tablet Take 37.5 mg by mouth daily.     metoprolol tartrate (LOPRESSOR) 100 MG tablet TAKE 1 & 1/2 TABS (150MG ) BY MOUTH 2 (TWO) TIMES DAILY. (Patient taking differently: Take 100 mg by mouth 2 (two) times daily.) 270 tablet 2   nitroGLYCERIN (NITROSTAT) 0.4 MG SL tablet Place 1 tablet (0.4 mg total) under the tongue every 5 (five) minutes as needed. 25 tablet 2   pantoprazole (PROTONIX) 40 MG tablet TAKE 1 TABLET BY MOUTH EVERY DAY 90 tablet 2   Semaglutide (RYBELSUS) 7 MG TABS Take 1 tablet (7 mg  total) by mouth daily. 30 tablet 2   tolterodine (DETROL LA) 2 MG 24 hr capsule Take 1 capsule (2 mg total) by mouth daily. 30 capsule 1   torsemide (DEMADEX) 20 MG tablet TAKE 2 TABLETS (40 MG) BY MOUTH DAILY 180 tablet 0   traZODone (DESYREL) 50 MG tablet Take 1 tablet (50 mg total) by mouth at bedtime as needed for sleep. 90 tablet 2   vitamin B-12 (CYANOCOBALAMIN) 1000 MCG tablet Take 1,000 mcg by mouth every other day.      XARELTO 15 MG  TABS tablet TAKE 1 TABLET (15 MG TOTAL) BY MOUTH DAILY WITH SUPPER 90 tablet 3   diclofenac Sodium (VOLTAREN) 1 % GEL Apply topically. (Patient not taking: Reported on 12/08/2022)     No current facility-administered medications for this visit.    Allergies:   Ciprofloxacin and Macrobid [nitrofurantoin]    Social History:  The patient  reports that she quit smoking about 10 years ago. Her smoking use included cigarettes. She started smoking about 60 years ago. She has a 50 pack-year smoking history. She has never used smokeless tobacco. She reports that she does not drink alcohol and does not use drugs.   Family History:  The patient's family history includes Diabetes in her mother; Heart attack in her brother and father; Heart disease in her sister; Hypertension in her mother; Pancreatic cancer in her brother; Prostate cancer (age of onset: 9) in her brother; Stroke in her mother.    ROS:  Please see the history of present illness.   Otherwise, review of systems are positive for palpitations.   All other systems are reviewed and negative.    PHYSICAL EXAM: VS:  BP 134/70   Pulse 76   Ht 5' (1.524 m)   Wt 197 lb (89.4 kg)   SpO2 96%   BMI 38.47 kg/m  , BMI Body mass index is 38.47 kg/m. GEN: Well nourished, well developed, in no acute distress HEENT: normal Neck: no JVD, carotid bruits, or masses Cardiac: RRR; no murmurs, rubs, or gallops,no edema  Respiratory:  clear to auscultation bilaterally, normal work of breathing GI: soft, nontender,  nondistended, + BS MS: no deformity or atrophy Skin: warm and dry, no rash Neuro:  Strength and sensation are intact Psych: euthymic mood, full affect   EKG:   The ekg ordered 5/24 demonstrates AFib, rate controlled   Recent Labs: 07/29/2022: ALT 17 08/18/2022: B Natriuretic Peptide 125.1 08/19/2022: Magnesium 2.1 08/20/2022: BUN 20; Creatinine, Ser 1.25; Hemoglobin 12.4; Platelets 180; Potassium 3.7; Sodium 134   Lipid Panel    Component Value Date/Time   CHOL 206 (H) 03/26/2020 0000   TRIG 358 (H) 03/26/2020 0000   HDL 32 (L) 03/26/2020 0000   CHOLHDL 6.4 (H) 03/26/2020 0000   CHOLHDL 2.9 02/05/2019 1315   VLDL 29 02/05/2019 1315   LDLCALC 112 (H) 03/26/2020 0000     Other studies Reviewed: Additional studies/ records that were reviewed today with results demonstrating: labs reviewed, Cr 1.25.   ASSESSMENT AND PLAN:  CAD: No angina.  Continue aggressive secondary prevention.  No bleeding issues.  Antiplatelet therapy was stopped when she started Xarelto. Hyperlipidemia: Check lipids when fasting. Needs LFTs.  Will see if PMD can do this in a few months. Obesity: Hypertension: The current medical regimen is effective;  continue present plan and medications. Atrial fibrillation: Rate control strategy.  Xarelto for stroke prevention. Anticoagulation: No bleeding issues.  Diabetes: Whole food, plant-based diet.  High-fiber diet.  Avoid processed foods.  A1C 7.9, now on Rybelsus.  High-fiber food list given.   Current medicines are reviewed at length with the patient today.  The patient concerns regarding her medicines were addressed.  The following changes have been made:  No change  Labs/ tests ordered today include:  No orders of the defined types were placed in this encounter.   Recommend 150 minutes/week of aerobic exercise Low fat, low carb, high fiber diet recommended  Disposition:   FU in 1 year   Signed, Lance Muss, MD  12/08/2022 3:05 PM  [facility] [address] [place], [place], [place]  [address] Phone: [phone]; Fax: [phone]

## 2022-12-08 ENCOUNTER — Encounter: Payer: Self-pay | Admitting: Interventional Cardiology

## 2022-12-08 ENCOUNTER — Ambulatory Visit: Payer: Medicare HMO | Attending: Interventional Cardiology | Admitting: Interventional Cardiology

## 2022-12-08 VITALS — BP 134/70 | HR 76 | Ht 60.0 in | Wt 197.0 lb

## 2022-12-08 DIAGNOSIS — E782 Mixed hyperlipidemia: Secondary | ICD-10-CM | POA: Diagnosis not present

## 2022-12-08 DIAGNOSIS — I25118 Atherosclerotic heart disease of native coronary artery with other forms of angina pectoris: Secondary | ICD-10-CM

## 2022-12-08 DIAGNOSIS — Z7901 Long term (current) use of anticoagulants: Secondary | ICD-10-CM | POA: Diagnosis not present

## 2022-12-08 DIAGNOSIS — E119 Type 2 diabetes mellitus without complications: Secondary | ICD-10-CM | POA: Diagnosis not present

## 2022-12-08 DIAGNOSIS — I10 Essential (primary) hypertension: Secondary | ICD-10-CM | POA: Diagnosis not present

## 2022-12-08 DIAGNOSIS — I5032 Chronic diastolic (congestive) heart failure: Secondary | ICD-10-CM | POA: Diagnosis not present

## 2022-12-08 DIAGNOSIS — I4821 Permanent atrial fibrillation: Secondary | ICD-10-CM | POA: Diagnosis not present

## 2022-12-08 NOTE — Patient Instructions (Addendum)
Medication Instructions:  Your physician recommends that you continue on your current medications as directed. Please refer to the Current Medication list given to you today.  *If you need a refill on your cardiac medications before your next appointment, please call your pharmacy*   Lab Work: Have lipid and liver profiles checked by primary care provider If you have labs (blood work) drawn today and your tests are completely normal, you will receive your results only by: MyChart Message (if you have MyChart) OR A paper copy in the mail If you have any lab test that is abnormal or we need to change your treatment, we will call you to review the results.   Testing/Procedures: none   Follow-Up: At [facility], you and your health needs are our priority.  As part of our continuing mission to provide you with exceptional heart care, we have created designated Provider Care Teams.  These Care Teams include your primary Cardiologist (physician) and Advanced Practice Providers (APPs -  Physician Assistants and Nurse Practitioners) who all work together to provide you with the care you need, when you need it.  We recommend signing up for the patient portal called "MyChart".  Sign up information is provided on this After Visit Summary.  MyChart is used to connect with patients for Virtual Visits (Telemedicine).  Patients are able to view lab/test results, encounter notes, upcoming appointments, etc.  Non-urgent messages can be sent to your provider as well.   To learn more about what you can do with MyChart, go to [email or web address].    Your next appointment:   March 27 , 2025 at 3:20  Provider:  Dr Lynnette Caffey  If Card or EP not listed click to update   DO NOT delete brackets or number around this link :1}   Other Instructions  High-Fiber Eating Plan Fiber, also called dietary fiber, is found in foods such as fruits, vegetables, whole grains, and beans. A high-fiber diet can be  good for your health. Your health care provider may recommend a high-fiber diet to help: Prevent trouble pooping (constipation). Lower your cholesterol. Treat the following conditions: Hemorrhoids. This is inflammation of veins in the anus. Inflammation of specific areas of the digestive tract. Irritable bowel syndrome (IBS). This is a problem of the large intestine, also called the colon, that sometimes causes belly pain and bloating. Prevent overeating as part of a weight-loss plan. Lower the risk of heart disease, type 2 diabetes, and certain cancers. What are tips for following this plan? Reading food labels  Check the nutrition facts label on foods for the amount of dietary fiber. Choose foods that have 4 grams of fiber or more per serving. The recommended goals for how much fiber you should eat each day include: Males 2 years old or younger: 30-34 g. Males over 31 years old: 28-34 g. Females 43 years old or younger: 25-28 g. Females over 52 years old: 22-25 g. Your daily fiber goal is _____________ g. Shopping Choose whole fruits and vegetables instead of processed. For example, choose apples instead of apple juice or applesauce. Choose a variety of high-fiber foods such as avocados, lentils, oats, and pinto beans. Read the nutrition facts label on foods. Check for foods with added fiber. These foods often have high sugar and salt (sodium) amounts per serving. Cooking Use whole-grain flour for baking and cooking. Cook with brown rice instead of white rice. Make meals that have a lot of beans and vegetables in them, such as chili or  vegetable-based soups. Meal planning Start the day with a breakfast that is high in fiber, such as a cereal that has 5 g of fiber or more per serving. Eat breads and cereals that are made with whole-grain flour instead of refined flour or white flour. Eat brown rice, bulgur wheat, or millet instead of white rice. Use beans in place of meat in soups,  salads, and pasta dishes. Be sure that half of the grains you eat each day are whole grains. General information You can get the recommended amount of dietary fiber by: Eating a variety of fruits, vegetables, grains, nuts, and beans. Taking a fiber supplement if you aren't able to eat enough fiber. It's better to get fiber through food than from a supplement. Slowly increase how much fiber you eat. If you increase the amount of fiber you eat too quickly, you may have bloating, cramping, or gas. Drink plenty of water to help you digest fiber. Choose high-fiber snacks, such as berries, raw vegetables, nuts, and popcorn. What foods should I eat? Fruits Berries. Pears. Apples. Oranges. Avocado. Prunes and raisins. Dried figs. Vegetables Sweet potatoes. Spinach. Kale. Artichokes. Cabbage. Broccoli. Cauliflower. Green peas. Carrots. Squash. Grains Whole-grain breads. Multigrain cereal. Oats and oatmeal. Brown rice. Barley. Bulgur wheat. Millet. Quinoa. Bran muffins. Popcorn. Rye wafer crackers. Meats and other proteins Navy beans, kidney beans, and pinto beans. Soybeans. Split peas. Lentils. Nuts and seeds. Dairy Fiber-fortified yogurt. Fortified means that fiber has been added to the product. Beverages Fiber-fortified soy milk. Fiber-fortified orange juice. Other foods Fiber bars. The items listed above may not be all the foods and drinks you can have. Talk to a dietitian to learn more. What foods should I avoid? Fruits Fruit juice. Cooked, strained fruit. Vegetables Fried potatoes. Canned vegetables. Well-cooked vegetables. Grains White bread. Pasta made with refined flour. White rice. Meats and other proteins Fatty meat. Fried chicken or fried fish. Dairy Milk. Cream cheese. Sour cream. Fats and oils Butters. Beverages Soft drinks. Other foods Cakes and pastries. The items listed above may not be all the foods and drinks you should avoid. Talk to a dietitian to learn  more. This information is not intended to replace advice given to you by your health care provider. Make sure you discuss any questions you have with your health care provider. Document Revised: 05/31/2022 Document Reviewed: 05/31/2022 Elsevier Patient Education  2024 [employer].

## 2022-12-18 ENCOUNTER — Other Ambulatory Visit: Payer: Self-pay | Admitting: Interventional Cardiology

## 2022-12-20 ENCOUNTER — Telehealth: Payer: Self-pay | Admitting: Family Medicine

## 2022-12-20 MED ORDER — TORSEMIDE 20 MG PO TABS: ORAL_TABLET | ORAL | 0 refills | Status: DC

## 2022-12-20 NOTE — Telephone Encounter (Signed)
Informed prescription sent in.

## 2022-12-20 NOTE — Addendum Note (Signed)
Addended by: Scharlene Gloss B on: 12/20/2022 09:03 AM   Modules accepted: Orders

## 2022-12-20 NOTE — Telephone Encounter (Signed)
Sent to [facility] [place]

## 2022-12-20 NOTE — Telephone Encounter (Signed)
The granddaughter states she is out and not due until Thursday. She is concerned we cannot refill this?

## 2022-12-20 NOTE — Telephone Encounter (Signed)
Pt's granddaughter Lillia Abed called to advise that patient is out of fluid pills but they are not due to be refilled until Thursday. Pt has been without them since Saturday.Granddaughter is concerned that she will become fluid overloaded in a day or two and have to be rushed to the hospital without them. Please call granddaughter back to advise what to do .

## 2022-12-26 DIAGNOSIS — G4733 Obstructive sleep apnea (adult) (pediatric): Secondary | ICD-10-CM | POA: Diagnosis not present

## 2023-01-04 ENCOUNTER — Telehealth: Payer: Self-pay | Admitting: Family Medicine

## 2023-01-04 MED ORDER — RYBELSUS 7 MG PO TABS: 7.0000 mg | ORAL_TABLET | Freq: Every day | ORAL | 2 refills | Status: DC

## 2023-01-04 NOTE — Telephone Encounter (Signed)
Sent in to pharmacy. Patient informed

## 2023-01-04 NOTE — Telephone Encounter (Signed)
Pt's granddaughter states her medication has increased so they cannot call it into the pharmacy.  Semaglutide (RYBELSUS)   [phone] [place] [place] - [address] [place] [address] Phone: [phone]  Fax: [phone]

## 2023-01-19 ENCOUNTER — Other Ambulatory Visit: Payer: Self-pay | Admitting: Family Medicine

## 2023-01-19 ENCOUNTER — Telehealth: Payer: Self-pay | Admitting: Family Medicine

## 2023-01-19 ENCOUNTER — Other Ambulatory Visit: Payer: Self-pay | Admitting: Interventional Cardiology

## 2023-01-19 DIAGNOSIS — E1165 Type 2 diabetes mellitus with hyperglycemia: Secondary | ICD-10-CM

## 2023-01-19 NOTE — Telephone Encounter (Signed)
Pt's granddaughter wanted to know if we are able to give the pt  DME rx for depends and chuck pads. Please advise.

## 2023-01-19 NOTE — Telephone Encounter (Signed)
Called pt spoke with granddaughter regarding chuck pads and advised her that this something over the counter.

## 2023-01-26 DIAGNOSIS — G4733 Obstructive sleep apnea (adult) (pediatric): Secondary | ICD-10-CM | POA: Diagnosis not present

## 2023-02-08 ENCOUNTER — Encounter: Payer: Self-pay | Admitting: Family Medicine

## 2023-02-09 ENCOUNTER — Other Ambulatory Visit: Payer: Self-pay | Admitting: Family Medicine

## 2023-02-09 ENCOUNTER — Telehealth: Payer: Medicare HMO | Admitting: Family Medicine

## 2023-02-09 ENCOUNTER — Encounter: Payer: Self-pay | Admitting: Family Medicine

## 2023-02-09 DIAGNOSIS — J189 Pneumonia, unspecified organism: Secondary | ICD-10-CM

## 2023-02-09 DIAGNOSIS — G4700 Insomnia, unspecified: Secondary | ICD-10-CM

## 2023-02-09 MED ORDER — AZITHROMYCIN 250 MG PO TABS
ORAL_TABLET | ORAL | 0 refills | Status: DC
Start: 2023-02-09 — End: 2023-06-20

## 2023-02-09 NOTE — Telephone Encounter (Signed)
Called pt adding her for VV 12 today.

## 2023-02-09 NOTE — Progress Notes (Signed)
CC: Cough  Holly Smith here for URI complaints. We are interacting via web portal for an electronic face-to-face visit. I verified patient's ID using 2 identifiers. Patient agreed to proceed with visit via this method. Patient is at home, I am at office. Patient and I are present for visit.   Duration: 1 week  Associated symptoms: subjective fever, sinus congestion, sinus pain, rhinorrhea, ear fullness, sore throat, wheezing, shortness of breath, and coughing Denies: itchy watery eyes, ear pain, ear drainage, myalgia, and N/V/D Treatment to date: OTC cold med Sick contacts: Yes; son and granddaughter  Past Medical History:  Diagnosis Date   Abnormal Pap smear of vagina and vaginal HPV    Anticoagulation adequate 08/02/2014   Overview:  Xarelto 20hs; CHADS=1+ htn, female, age  Overview:  Xarelto 20hs; CHADS=1+ htn, female, age   Arthritis    "bones ache" (01/23/2015)   Atrial fibrillation, persistent (HCC) 08/13/2015   Overview:  symptomatic (fatigue, DOE, decreased exercise tolerance, palpitations), drug refractory (propafenone, amiodarone), complicated recovery from DCCV possibly related to repiratory obstruction [facility] (patient fear of DCCV)  Overview:  symptomatic (fatigue, DOE, decreased exercise tolerance, palpitations), drug refractory (propafenone, amiodarone), complicated recovery from DCCV possibly relat   Benign neoplasm of ascending colon    Benign neoplasm of descending colon    Benign neoplasm of sigmoid colon    Breath shortness 08/20/2013   CHF (congestive heart failure) (HCC)    Chronic atrial fibrillation (HCC)    Chronic combined systolic and diastolic CHF, NYHA class 3 (HCC) 01/28/2015   Essential hypertension 10/07/2014   Trial off acei 10/07/2014  Due to atypical sob (talking = walking)> no real change 11/18/2014     Family history of adverse reaction to anesthesia    sister gets very nauseated and vomits   GERD (gastroesophageal reflux disease)    History of  kidney stones    HPV (human papilloma virus) anogenital infection    03/21/12 PAP + HR HPV   Mitral regurgitation 04/23/2013   Overview:  "Moderate to severe" by TTE, "mild to mod" by TEE  Overview:  "Moderate to severe" by TTE, "mild to mod" by TEE   Obesity    OSA (obstructive sleep apnea) 11/27/2014   07/2013 RDI 17/h CPAP 8    Other specified diseases of esophagus    Postoperative nausea and vomiting 1983   many years ago with hysterectomy   PVC (premature ventricular contraction) 12/16/2014   TI (tricuspid incompetence) 04/23/2013   Overview:  "moderate" by TTE    Urolithiasis    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 F/u prn. If starting to experience fevers, shaking, or shortness of breath, seek immediate care. Pt voiced understanding and agreement to the plan.  [name] [place], DO 02/09/23 12:13 PM

## 2023-02-25 DIAGNOSIS — G4733 Obstructive sleep apnea (adult) (pediatric): Secondary | ICD-10-CM | POA: Diagnosis not present

## 2023-03-10 ENCOUNTER — Other Ambulatory Visit: Payer: Self-pay | Admitting: Family Medicine

## 2023-04-11 ENCOUNTER — Encounter: Payer: Self-pay | Admitting: Family Medicine

## 2023-04-11 ENCOUNTER — Ambulatory Visit (INDEPENDENT_AMBULATORY_CARE_PROVIDER_SITE_OTHER): Payer: Medicare Other | Admitting: Family Medicine

## 2023-04-11 VITALS — BP 128/72 | HR 95 | Temp 98.0°F | Resp 16 | Ht 61.0 in | Wt 195.0 lb

## 2023-04-11 DIAGNOSIS — J0100 Acute maxillary sinusitis, unspecified: Secondary | ICD-10-CM | POA: Diagnosis not present

## 2023-04-11 DIAGNOSIS — R300 Dysuria: Secondary | ICD-10-CM | POA: Diagnosis not present

## 2023-04-11 DIAGNOSIS — R8290 Unspecified abnormal findings in urine: Secondary | ICD-10-CM

## 2023-04-11 LAB — POC URINALSYSI DIPSTICK (AUTOMATED)
Bilirubin, UA: NEGATIVE
Blood, UA: NEGATIVE
Glucose, UA: NEGATIVE
Ketones, UA: NEGATIVE
Nitrite, UA: NEGATIVE
Protein, UA: NEGATIVE
Spec Grav, UA: 1.01 (ref 1.010–1.025)
Urobilinogen, UA: 0.2 U/dL
pH, UA: 5 (ref 5.0–8.0)

## 2023-04-11 MED ORDER — FLUCONAZOLE 150 MG PO TABS: ORAL_TABLET | ORAL | 0 refills | Status: DC

## 2023-04-11 MED ORDER — CEFDINIR 300 MG PO CAPS: 300.0000 mg | ORAL_CAPSULE | Freq: Two times a day (BID) | ORAL | 0 refills | Status: AC

## 2023-04-11 MED ORDER — PROMETHAZINE-DM 6.25-15 MG/5ML PO SYRP: 5.0000 mL | ORAL_SOLUTION | Freq: Four times a day (QID) | ORAL | 0 refills | Status: DC | PRN

## 2023-04-11 NOTE — Progress Notes (Signed)
Chief Complaint  Patient presents with   Nasal Congestion    Discuss congestion     Holly Smith here for URI complaints.  Duration: 4 days  Associated symptoms: sinus congestion, sinus pain, rhinorrhea, ear pain, sore throat, wheezing, shortness of breath, and coughing Denies: itchy watery eyes, ear drainage, myalgia, and fevers , vomiting Treatment to date: Coricidin Sick contacts: No  Over past week, has had urinary freq, urgency, b/l flank pain, incontinence, and dysuria. No bleeding, fevers, new N/V, dc, hesitancy. Has not tried anything at home.   Past Medical History:  Diagnosis Date   Abnormal Pap smear of vagina and vaginal HPV    Anticoagulation adequate 08/02/2014   Overview:  Xarelto 20hs; CHADS=1+ htn, female, age  Overview:  Xarelto 20hs; CHADS=1+ htn, female, age   Arthritis    "bones ache" (01/23/2015)   Atrial fibrillation, persistent (HCC) 08/13/2015   Overview:  symptomatic (fatigue, DOE, decreased exercise tolerance, palpitations), drug refractory (propafenone, amiodarone), complicated recovery from DCCV possibly related to repiratory obstruction [facility] (patient fear of DCCV)  Overview:  symptomatic (fatigue, DOE, decreased exercise tolerance, palpitations), drug refractory (propafenone, amiodarone), complicated recovery from DCCV possibly relat   Benign neoplasm of ascending colon    Benign neoplasm of descending colon    Benign neoplasm of sigmoid colon    Breath shortness 08/20/2013   CHF (congestive heart failure) (HCC)    Chronic atrial fibrillation (HCC)    Chronic combined systolic and diastolic CHF, NYHA class 3 (HCC) 01/28/2015   Essential hypertension 10/07/2014   Trial off acei 10/07/2014  Due to atypical sob (talking = walking)> no real change 11/18/2014     Family history of adverse reaction to anesthesia    sister gets very nauseated and vomits   GERD (gastroesophageal reflux disease)    History of kidney stones    HPV (human papilloma virus)  anogenital infection    03/21/12 PAP + HR HPV   Mitral regurgitation 04/23/2013   Overview:  "Moderate to severe" by TTE, "mild to mod" by TEE  Overview:  "Moderate to severe" by TTE, "mild to mod" by TEE   Obesity    OSA (obstructive sleep apnea) 11/27/2014   07/2013 RDI 17/h CPAP 8    Other specified diseases of esophagus    Postoperative nausea and vomiting 1983   many years ago with hysterectomy   PVC (premature ventricular contraction) 12/16/2014   TI (tricuspid incompetence) 04/23/2013   Overview:  "moderate" by TTE    Urolithiasis    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 History of genital warts and vulvar condyloma    Objective BP 128/72   Pulse 95   Temp 98 F (36.7 C) (Oral)   Resp 16   Ht 5\' 1"  (1.549 m)   Wt 195 lb (88.5 kg)   SpO2 98%   BMI 36.84 kg/m  General: Awake, alert, appears stated age HEENT: AT, [place], ears patent b/l and TM's neg, nares patent w/o discharge, pharynx pink and without exudates, MMM, +ttp in max sinuses Neck: No masses or asymmetry Heart: RRR Abd: +TTP over suprapubic region MSK: no ttp over CVA's b/l Lungs: CTAB, no accessory muscle use Psych: Age appropriate judgment and insight, normal mood and affect  Dysuria - Plan: cefdinir (OMNICEF) 300 MG capsule  Urine abnormality - Plan: Urine Culture, Urinalysis  Acute maxillary sinusitis, recurrence not specified  1/2. 5 days of Omnicef 300 mg twice daily.  Diflucan as needed.  3.  Probably viral.  Omnicef should cover this also.  Continue to push fluids, practice good hand hygiene, cover mouth when coughing. F/u prn. If starting to experience fevers, shaking, or shortness of breath, seek immediate care. Pt voiced understanding and agreement to the plan.  [name] [place], DO 04/11/23 3:36 PM

## 2023-04-12 ENCOUNTER — Telehealth: Payer: Self-pay

## 2023-04-12 LAB — URINE CULTURE
MICRO NUMBER:: 15977019
SPECIMEN QUALITY:: ADEQUATE

## 2023-04-12 NOTE — Telephone Encounter (Signed)
PA initiated via Covermymeds; KEY: [id number]. Awaiting determination.

## 2023-04-13 ENCOUNTER — Telehealth: Payer: Self-pay

## 2023-04-13 ENCOUNTER — Encounter: Payer: Self-pay | Admitting: Family Medicine

## 2023-04-13 NOTE — Telephone Encounter (Signed)
Called pt was advised Medication was approved by insurance and can pick up at her pharmacy.Rybelsus

## 2023-04-13 NOTE — Telephone Encounter (Signed)
PA approved.   Approved. . Authorization Expiration Date: April 11, 2024.

## 2023-04-28 DIAGNOSIS — G4733 Obstructive sleep apnea (adult) (pediatric): Secondary | ICD-10-CM | POA: Diagnosis not present

## 2023-04-30 ENCOUNTER — Other Ambulatory Visit: Payer: Self-pay | Admitting: Family Medicine

## 2023-05-24 ENCOUNTER — Encounter: Payer: Self-pay | Admitting: Family Medicine

## 2023-06-02 ENCOUNTER — Encounter: Payer: Self-pay | Admitting: *Deleted

## 2023-06-02 ENCOUNTER — Other Ambulatory Visit: Payer: Self-pay | Admitting: Family Medicine

## 2023-06-06 NOTE — Progress Notes (Signed)
 Cardiology Office Note:   Date:  06/16/2023  ID:  Therma, Lasure 1945-09-01, MRN [id number] PCP:  [name], DO  [facility] Providers Cardiologist:  Alverda Skeans, MD Referring MD: [name]*  Chief Complaint/Reason for Referral: Follow-up for coronary artery disease ASSESSMENT:    1. Permanent atrial fibrillation (HCC)   2. Secondary hypercoagulable state (HCC)   3. Chronic diastolic CHF (congestive heart failure) (HCC)   4. Coronary artery disease involving native coronary artery of native heart with other form of angina pectoris (HCC)   5. Type 2 diabetes mellitus with complication, without long-term current use of insulin (HCC)   6. Hypertension associated with diabetes (HCC)   7. Hyperlipidemia associated with type 2 diabetes mellitus (HCC)   8. Aortic atherosclerosis (HCC)   9. CKD stage 3 due to type 2 diabetes mellitus (HCC)   10. BMI 38.0-38.9,adult     PLAN:   In order of problems listed above: Atrial fibrillation: Continue Xarelto 15mg  daily and Lopressor 150 mg twice daily. Secondary hypercoagulable state: Continue Xarelto 15 mg daily. Chronic diastolic heart failure: Continue Farxiga 10 mg daily, restart irbesartan 37.5 mg daily daily, Lopressor 150 mg twice daily.  Check echocardiogram. Coronary artery disease: Continue Xarelto 15 mg daily, patient defers statins Type 2 diabetes mellitus: Continue Xarelto 50 mg daily, restart irbesartan 37.5 mg daily 75 mg daily, continue semaglutide 7 mg daily, and continue Farxiga 10 mg daily. Hypertension: Restart irbesartan 37.5 mg daily, Lopressor 150 mg twice daily. Hyperlipidemia: Patient defers statins; I told her to think about re-trialing Aortic atherosclerosis: Continue Xarelto 50 mg daily, start atorvastatin 40 mg daily, and strict blood pressure control. CKD stage III: Continue Farxiga 10 mg daily and irbesartan 37.5 mg daily  Elevated BMI: Continue semaglutide 7 mg daily.             Dispo:  Return in about 6 months (around 12/17/2023).      Medication Adjustments/Labs and Tests Ordered: Current medicines are reviewed at length with the patient today.  Concerns regarding medicines are outlined above.  The following changes have been made:     Labs/tests ordered: Orders Placed This Encounter  Procedures   ECHOCARDIOGRAM COMPLETE    Medication Changes: No orders of the defined types were placed in this encounter.   Current medicines are reviewed at length with the patient today.  The patient does not have concerns regarding medicines.  I spent 35 minutes reviewing all clinical data during and prior to this visit including all relevant imaging studies, laboratories, clinical information from other health systems and prior notes from both Cardiology and other specialties, interviewing the patient, conducting a complete physical examination, and coordinating care in order to formulate a comprehensive and personalized evaluation and treatment plan.   History of Present Illness:      FOCUSED PROBLEM LIST:   Permanent AF CV 2 score 6 On Xarelto Failed propafenone and amiodarone >> not a candidate for PVI Chronic diastolic heart failure LAE, no significant valve problems, EF 55 to 60% TTE 2016 CAD Angina >> PCI of mid to distal LAD DES x 1 2020 Hyperlipidemia Defers statin Aortic atherosclerosis Chest CT 2024 CKD stage IIIb BMI 38 Type 2 diabetes mellitus Not on insulin  March 2025:  Patient consents to use of AI scribe. The patient returns for routine follow-up.  She was seen in September of last year and was doing well at that time.  Her blood pressure was under good control at that  time.  She experiences occasional breathing difficulties, which she associates with dietary intake, particularly sodium. Her granddaughter is involved in monitoring her diet. She reports symptoms of neuropathy, describing sensations like 'pins and needles' and 'sand in  my shoes'. She has not found the symptoms to be painful but rather aggravating. She is on two fluid pills daily to manage swelling, which increases if she reduces the dose. She experiences frequent urination as a result of the medication.           Current Medications: Current Meds  Medication Sig   azithromycin (ZITHROMAX) 250 MG tablet Take 2 tabs the first day and then 1 tab daily until you run out.   Cholecalciferol (VITAMIN D3) 1000 units CAPS Take by mouth.   diclofenac Sodium (VOLTAREN) 1 % GEL Apply topically.   FARXIGA 10 MG TABS tablet Take 1 tablet (10 mg total) by mouth daily.   fluconazole (DIFLUCAN) 150 MG tablet Take 1 tab, repeat in 72 hours if no improvement.   fluticasone (FLONASE) 50 MCG/ACT nasal spray Place 2 sprays into both nostrils daily.   glipiZIDE (GLUCOTROL) 10 MG tablet Take 1 tablet (10 mg total) by mouth 2 (two) times daily before a meal.   irbesartan (AVAPRO) 75 MG tablet Take 37.5 mg by mouth daily.   metoprolol tartrate (LOPRESSOR) 100 MG tablet TAKE 1 & 1/2 TABS (150MG ) BY MOUTH 2 (TWO) TIMES DAILY.   nitroGLYCERIN (NITROSTAT) 0.4 MG SL tablet Place 1 tablet (0.4 mg total) under the tongue every 5 (five) minutes as needed.   pantoprazole (PROTONIX) 40 MG tablet TAKE 1 TABLET BY MOUTH EVERY DAY   promethazine-dextromethorphan (PROMETHAZINE-DM) 6.25-15 MG/5ML syrup Take 5 mLs by mouth 4 (four) times daily as needed for cough.   Semaglutide (RYBELSUS) 7 MG TABS TAKE 1 TABLET (7 MG TOTAL) BY MOUTH DAILY   tolterodine (DETROL LA) 2 MG 24 hr capsule Take 1 capsule (2 mg total) by mouth daily.   torsemide (DEMADEX) 20 MG tablet TAKE 2 TABLETS BY MOUTH EVERY DAY   torsemide (DEMADEX) 20 MG tablet TAKE 2 TABLETS BY MOUTH EVERY DAY   traZODone (DESYREL) 50 MG tablet Take 1 tablet (50 mg total) by mouth at bedtime as needed for sleep.   vitamin B-12 (CYANOCOBALAMIN) 1000 MCG tablet Take 1,000 mcg by mouth every other day.    XARELTO 15 MG TABS tablet TAKE 1 TABLET  (15 MG TOTAL) BY MOUTH DAILY WITH SUPPER     Review of Systems:   Please see the history of present illness.    All other systems reviewed and are negative.     EKGs/Labs/Other Test Reviewed:   EKG: EKG from May 2024 demonstrates atrial fibrillation with rapid ventricular rate  EKG Interpretation Date/Time:    Ventricular Rate:    PR Interval:    QRS Duration:    QT Interval:    QTC Calculation:   R Axis:      Text Interpretation:           Risk Assessment/Calculations:    CHA2DS2-VASc Score = 6   This indicates a 9.7% annual risk of stroke. The patient's score is based upon: CHF History: 1 HTN History: 1 Diabetes History: 1 Stroke History: 0 Vascular Disease History: 0 Age Score: 2 Gender Score: 1         Physical Exam:   VS:  BP 134/80   Pulse 82   Ht 5' (1.524 m)   Wt 187 lb 9.6 oz (85.1 kg)  SpO2 96%   BMI 36.64 kg/m        Wt Readings from Last 3 Encounters:  06/16/23 187 lb 9.6 oz (85.1 kg)  04/11/23 195 lb (88.5 kg)  12/08/22 197 lb (89.4 kg)      GENERAL:  No apparent distress, AOx3 HEENT:  No carotid bruits, +2 carotid impulses, no scleral icterus CAR: Irregular rate and rhythm no murmurs, gallops, rubs, or thrills RES:  Clear to auscultation bilaterally ABD:  Soft, nontender, nondistended, positive bowel sounds x 4 VASC:  +2 radial pulses, +2 carotid pulses NEURO:  CN 2-12 grossly intact; motor and sensory grossly intact PSYCH:  No active depression or anxiety EXT:  No edema, ecchymosis, or cyanosis  Signed, Orbie Pyo, MD  06/16/2023 5:03 PM    [facility] [address] [place], [place], [place]  [address] Phone: [phone]; Fax: [phone]   Note:  This document was prepared using Dragon voice recognition software and may include unintentional dictation errors.

## 2023-06-09 ENCOUNTER — Other Ambulatory Visit: Payer: Self-pay | Admitting: Family Medicine

## 2023-06-13 ENCOUNTER — Other Ambulatory Visit: Payer: Self-pay | Admitting: Family Medicine

## 2023-06-15 ENCOUNTER — Other Ambulatory Visit: Payer: Self-pay | Admitting: Family Medicine

## 2023-06-16 ENCOUNTER — Encounter: Payer: Self-pay | Admitting: Internal Medicine

## 2023-06-16 ENCOUNTER — Ambulatory Visit: Payer: Medicare HMO | Attending: Internal Medicine | Admitting: Internal Medicine

## 2023-06-16 VITALS — BP 134/80 | HR 82 | Ht 60.0 in | Wt 187.6 lb

## 2023-06-16 DIAGNOSIS — D6869 Other thrombophilia: Secondary | ICD-10-CM | POA: Diagnosis not present

## 2023-06-16 DIAGNOSIS — I700 Atherosclerosis of aorta: Secondary | ICD-10-CM | POA: Diagnosis not present

## 2023-06-16 DIAGNOSIS — E1169 Type 2 diabetes mellitus with other specified complication: Secondary | ICD-10-CM | POA: Diagnosis not present

## 2023-06-16 DIAGNOSIS — I25118 Atherosclerotic heart disease of native coronary artery with other forms of angina pectoris: Secondary | ICD-10-CM

## 2023-06-16 DIAGNOSIS — E1159 Type 2 diabetes mellitus with other circulatory complications: Secondary | ICD-10-CM | POA: Diagnosis not present

## 2023-06-16 DIAGNOSIS — E1122 Type 2 diabetes mellitus with diabetic chronic kidney disease: Secondary | ICD-10-CM | POA: Diagnosis not present

## 2023-06-16 DIAGNOSIS — I152 Hypertension secondary to endocrine disorders: Secondary | ICD-10-CM | POA: Diagnosis not present

## 2023-06-16 DIAGNOSIS — N183 Chronic kidney disease, stage 3 unspecified: Secondary | ICD-10-CM

## 2023-06-16 DIAGNOSIS — E118 Type 2 diabetes mellitus with unspecified complications: Secondary | ICD-10-CM

## 2023-06-16 DIAGNOSIS — E785 Hyperlipidemia, unspecified: Secondary | ICD-10-CM | POA: Diagnosis not present

## 2023-06-16 DIAGNOSIS — Z6838 Body mass index (BMI) 38.0-38.9, adult: Secondary | ICD-10-CM

## 2023-06-16 DIAGNOSIS — I5032 Chronic diastolic (congestive) heart failure: Secondary | ICD-10-CM | POA: Diagnosis not present

## 2023-06-16 DIAGNOSIS — I4821 Permanent atrial fibrillation: Secondary | ICD-10-CM

## 2023-06-16 NOTE — Patient Instructions (Signed)
 Medication Instructions:  Your physician has recommended you make the following change in your medication:   1) RESTART your irbesartan 37.5 mg daily  *If you need a refill on your cardiac medications before your next appointment, please call your pharmacy*  Testing/Procedures: Your physician has requested that you have an echocardiogram. Echocardiography is a painless test that uses sound waves to create images of your heart. It provides your doctor with information about the size and shape of your heart and how well your heart's chambers and valves are working. This procedure takes approximately one hour. There are no restrictions for this procedure. Please do NOT wear cologne, perfume, aftershave, or lotions (deodorant is allowed). Please arrive 15 minutes prior to your appointment time.  Please note: We ask at that you not bring children with you during ultrasound (echo/ vascular) testing. Due to room size and safety concerns, children are not allowed in the ultrasound rooms during exams. Our front office staff cannot provide observation of children in our lobby area while testing is being conducted. An adult accompanying a patient to their appointment will only be allowed in the ultrasound room at the discretion of the ultrasound technician under special circumstances. We apologize for any inconvenience.  Follow-Up: At [facility], you and your health needs are our priority.  As part of our continuing mission to provide you with exceptional heart care, we have created designated Provider Care Teams.  These Care Teams include your primary Cardiologist (physician) and Advanced Practice Providers (APPs -  Physician Assistants and Nurse Practitioners) who all work together to provide you with the care you need, when you need it.  Your next appointment:   6 month(s)  The format for your next appointment:   In Person  Provider:   Jari Favre, PA-C, Ronie Spies, PA-C, Robin Searing, NP, or  Tereso Newcomer, PA-C       Other Instructions   1st Floor: - Lobby - Registration  - Pharmacy  - Lab - Cafe  2nd Floor: - PV Lab - Diagnostic Testing (echo, CT, nuclear med)  3rd Floor: - Vacant  4th Floor: - TCTS (cardiothoracic surgery) - AFib Clinic - Structural Heart Clinic - Vascular Surgery  - Vascular Ultrasound  5th Floor: - [facility] (general and EP) - Clinical Pharmacy for coumadin, hypertension, lipid, weight-loss medications, and med management appointments    Valet parking services will be available as well.

## 2023-06-19 ENCOUNTER — Other Ambulatory Visit: Payer: Self-pay | Admitting: Family Medicine

## 2023-06-20 ENCOUNTER — Ambulatory Visit (INDEPENDENT_AMBULATORY_CARE_PROVIDER_SITE_OTHER): Admitting: Family Medicine

## 2023-06-20 VITALS — BP 130/78 | HR 72 | Ht 60.0 in | Wt 192.6 lb

## 2023-06-20 DIAGNOSIS — Z7984 Long term (current) use of oral hypoglycemic drugs: Secondary | ICD-10-CM | POA: Diagnosis not present

## 2023-06-20 DIAGNOSIS — R300 Dysuria: Secondary | ICD-10-CM | POA: Diagnosis not present

## 2023-06-20 DIAGNOSIS — E1165 Type 2 diabetes mellitus with hyperglycemia: Secondary | ICD-10-CM

## 2023-06-20 DIAGNOSIS — E1142 Type 2 diabetes mellitus with diabetic polyneuropathy: Secondary | ICD-10-CM | POA: Diagnosis not present

## 2023-06-20 MED ORDER — DULOXETINE HCL 20 MG PO CPEP
20.0000 mg | ORAL_CAPSULE | Freq: Every day | ORAL | 1 refills | Status: DC
Start: 2023-06-20 — End: 2023-07-14

## 2023-06-20 NOTE — Patient Instructions (Signed)
Give [place] 2-3 business days to get the results of your labs back.   Keep the diet clean and stay active.  Please get your tetanus booster at the pharmacy.   Let us know if you need anything.

## 2023-06-20 NOTE — Progress Notes (Signed)
 Subjective:   Chief Complaint  Patient presents with   Follow-up    Patient presents today for a follow-up    Holly Smith is a 78 y.o. female here for follow-up of diabetes.   Holly Smith's self monitored glucose range is low 100's.  Patient denies hypoglycemic reactions. She checks her glucose levels intermittently.  Patient does not require insulin.   Medications include: Rybelsus 7 mg/d, glipizide 10 mg bid, Farxiga 10 mg/d Diet is OK.  Exercise: some walking She has tingling on the bottom of her feet that feels like she is walking on sand.   Past Medical History:  Diagnosis Date   Abnormal Pap smear of vagina and vaginal HPV    Anticoagulation adequate 08/02/2014   Overview:  Xarelto 20hs; CHADS=1+ htn, female, age  Overview:  Xarelto 20hs; CHADS=1+ htn, female, age   Arthritis    "bones ache" (01/23/2015)   Atrial fibrillation, persistent (HCC) 08/13/2015   Overview:  symptomatic (fatigue, DOE, decreased exercise tolerance, palpitations), drug refractory (propafenone, amiodarone), complicated recovery from DCCV possibly related to repiratory obstruction [facility] (patient fear of DCCV)  Overview:  symptomatic (fatigue, DOE, decreased exercise tolerance, palpitations), drug refractory (propafenone, amiodarone), complicated recovery from DCCV possibly relat   Benign neoplasm of ascending colon    Benign neoplasm of descending colon    Benign neoplasm of sigmoid colon    Breath shortness 08/20/2013   CHF (congestive heart failure) (HCC)    Chronic atrial fibrillation (HCC)    Chronic combined systolic and diastolic CHF, NYHA class 3 (HCC) 01/28/2015   Essential hypertension 10/07/2014   Trial off acei 10/07/2014  Due to atypical sob (talking = walking)> no real change 11/18/2014     Family history of adverse reaction to anesthesia    sister gets very nauseated and vomits   GERD (gastroesophageal reflux disease)    History of kidney stones    HPV (human papilloma virus) anogenital  infection    03/21/12 PAP + HR HPV   Mitral regurgitation 04/23/2013   Overview:  "Moderate to severe" by TTE, "mild to mod" by TEE  Overview:  "Moderate to severe" by TTE, "mild to mod" by TEE   Obesity    OSA (obstructive sleep apnea) 11/27/2014   07/2013 RDI 17/h CPAP 8    Other specified diseases of esophagus    Postoperative nausea and vomiting 1983   many years ago with hysterectomy   PVC (premature ventricular contraction) 12/16/2014   TI (tricuspid incompetence) 04/23/2013   Overview:  "moderate" by TTE    Urolithiasis    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 History of genital warts and vulvar condyloma     Related testing: Retinal exam: Done Pneumovax: done  Objective:  BP 130/78   Pulse 72   Ht 5' (1.524 m)   Wt 192 lb 9.6 oz (87.4 kg)   SpO2 97%   BMI 37.61 kg/m  General:  Well developed, well nourished, in no apparent distress Skin:  Warm, no pallor or diaphoresis Head:  Normocephalic, atraumatic Eyes:  Pupils equal and round, sclera anicteric without injection  Lungs:  CTAB, no access msc use Cardio:  RRR, no bruits, no LE edema Musculoskeletal:  Symmetrical muscle groups noted without atrophy or deformity Neuro:  Sensation intact, but decreased, to pinprick on feet; distal sensation the worst. Psych: Age appropriate judgment and insight  Assessment:   Type 2 diabetes mellitus with hyperglycemia, without long-term current use of insulin (HCC) -  Plan: Comprehensive metabolic panel with GFR, Hemoglobin A1c, Lipid panel, Microalbumin / creatinine urine ratio  Essential hypertension  Diabetic polyneuropathy associated with type 2 diabetes mellitus (HCC) - Plan: DULoxetine (CYMBALTA) 20 MG capsule  Dysuria - Plan: Urine Culture   Plan:   Chronic, stable.  Continue Rybelsus 7 mg daily, glipizide 10 mg twice daily, Farxiga 10 mg daily.  Counseled on diet and exercise.  Check labs as above. Chronic, unstable.  Add Cymbalta 20  mg daily.  Follow-up in 1 month to recheck on this. Check UA/culture. F/u in 3-6 mo pending the above. The patient voiced understanding and agreement to the plan.  [name] [place], DO 06/20/23 5:13 PM

## 2023-06-21 ENCOUNTER — Encounter: Payer: Self-pay | Admitting: Family Medicine

## 2023-06-21 LAB — MICROALBUMIN / CREATININE URINE RATIO
Creatinine,U: 65.8 mg/dL
Microalb Creat Ratio: 62.8 mg/g — ABNORMAL HIGH (ref 0.0–30.0)
Microalb, Ur: 4.1 mg/dL — ABNORMAL HIGH (ref 0.0–1.9)

## 2023-06-21 LAB — COMPREHENSIVE METABOLIC PANEL WITH GFR
ALT: 21 U/L (ref 0–35)
AST: 20 U/L (ref 0–37)
Albumin: 4.1 g/dL (ref 3.5–5.2)
Alkaline Phosphatase: 68 U/L (ref 39–117)
BUN: 21 mg/dL (ref 6–23)
CO2: 32 meq/L (ref 19–32)
Calcium: 9.5 mg/dL (ref 8.4–10.5)
Chloride: 101 meq/L (ref 96–112)
Creatinine, Ser: 1.22 mg/dL — ABNORMAL HIGH (ref 0.40–1.20)
GFR: 42.19 mL/min — ABNORMAL LOW (ref >60.00)
Glucose, Bld: 112 mg/dL — ABNORMAL HIGH (ref 70–99)
Potassium: 4 meq/L (ref 3.5–5.1)
Sodium: 143 meq/L (ref 135–145)
Total Bilirubin: 0.4 mg/dL (ref 0.2–1.2)
Total Protein: 7.4 g/dL (ref 6.0–8.3)

## 2023-06-21 LAB — LIPID PANEL
Cholesterol: 177 mg/dL (ref 0–200)
HDL: 38.6 mg/dL — ABNORMAL LOW (ref >39.00)
LDL Cholesterol: 97 mg/dL (ref 0–99)
NonHDL: 137.98
Total CHOL/HDL Ratio: 5
Triglycerides: 207 mg/dL — ABNORMAL HIGH (ref 0.0–149.0)
VLDL: 41.4 mg/dL — ABNORMAL HIGH (ref 0.0–40.0)

## 2023-06-21 LAB — HEMOGLOBIN A1C: Hgb A1c MFr Bld: 6.5 % (ref 4.6–6.5)

## 2023-06-22 ENCOUNTER — Other Ambulatory Visit: Payer: Self-pay | Admitting: Family Medicine

## 2023-06-22 LAB — URINE CULTURE
MICRO NUMBER:: 16268137
SPECIMEN QUALITY:: ADEQUATE

## 2023-06-22 MED ORDER — CEFDINIR 300 MG PO CAPS
300.0000 mg | ORAL_CAPSULE | Freq: Two times a day (BID) | ORAL | 0 refills | Status: AC
Start: 2023-06-22 — End: 2023-06-27

## 2023-07-02 ENCOUNTER — Other Ambulatory Visit: Payer: Self-pay | Admitting: Family Medicine

## 2023-07-14 ENCOUNTER — Encounter: Payer: Self-pay | Admitting: Family

## 2023-07-14 ENCOUNTER — Ambulatory Visit: Payer: Self-pay

## 2023-07-14 ENCOUNTER — Ambulatory Visit (INDEPENDENT_AMBULATORY_CARE_PROVIDER_SITE_OTHER): Admitting: Family

## 2023-07-14 ENCOUNTER — Other Ambulatory Visit: Payer: Self-pay | Admitting: Family Medicine

## 2023-07-14 VITALS — BP 120/82 | HR 89 | Ht 60.0 in | Wt 190.6 lb

## 2023-07-14 DIAGNOSIS — R590 Localized enlarged lymph nodes: Secondary | ICD-10-CM | POA: Diagnosis not present

## 2023-07-14 DIAGNOSIS — E1142 Type 2 diabetes mellitus with diabetic polyneuropathy: Secondary | ICD-10-CM

## 2023-07-14 MED ORDER — AMOXICILLIN-POT CLAVULANATE 875-125 MG PO TABS: 1.0000 | ORAL_TABLET | Freq: Two times a day (BID) | ORAL | 0 refills | Status: DC

## 2023-07-14 MED ORDER — CEFDINIR 300 MG PO CAPS: 300.0000 mg | ORAL_CAPSULE | Freq: Two times a day (BID) | ORAL | 0 refills | Status: AC

## 2023-07-14 NOTE — Progress Notes (Signed)
 Holly Smith is a 78 y.o. female with the following history as recorded in EpicCare:  Patient Active Problem List   Diagnosis Date Noted   Overactive bladder 11/03/2022   Nausea vomiting and diarrhea 08/19/2022   Acute renal failure superimposed on stage 3a chronic kidney disease (HCC) 08/19/2022   Acute respiratory failure with hypoxia (HCC) 07/27/2022   Gastroenteritis 07/27/2022   Acute cystitis 07/27/2022   Morbid obesity with body mass index (BMI) of 40.0 or higher (HCC) 05/25/2022   OSA on CPAP 05/25/2022   Insomnia 05/25/2022   CAD (coronary artery disease) 10/08/2020   Type 2 diabetes mellitus with hyperglycemia, without long-term current use of insulin  (HCC) 10/08/2020   Stable angina (HCC)    Lichen planus 05/31/2018   Vertigo 03/11/2017   Sepsis (HCC) 12/13/2016   Dyspepsia    Gastritis without bleeding    Other specified disease of esophagus    History of alcohol use 09/07/2016   Hypertensive heart disease 05/12/2016   Atrial fibrillation, persistent (HCC) 08/13/2015   COPD (chronic obstructive pulmonary disease) (HCC) 08/13/2015   Personal history of other specified conditions 08/13/2015   Insulin  resistance 08/13/2015   Obesity, unspecified 08/13/2015   Special screening for malignant neoplasms, colon    Benign neoplasm of ascending colon    Benign neoplasm of descending colon    Benign neoplasm of sigmoid colon    Diverticulitis 01/31/2015   Diverticulitis large intestine w/o perforation or abscess w/bleeding 01/28/2015   Chronic combined systolic and diastolic CHF, NYHA class 3 (HCC) 01/28/2015   Acute diverticulitis 01/28/2015   Diverticulitis of large intestine without perforation or abscess with bleeding    Hypertensive urgency 01/21/2015   Morbid obesity (HCC) 01/21/2015   Blood in stool 01/21/2015   Acute diastolic CHF (congestive heart failure) (HCC) 01/21/2015   CAFL (chronic airflow limitation) (HCC) 12/16/2014   Personal history of  perinatal problems 12/16/2014   Hyperlipidemia, unspecified 12/16/2014   Dysmetabolic syndrome 12/16/2014   Kidney stones 12/16/2014   PVC (premature ventricular contraction) 12/16/2014   Snoring 12/16/2014   Stress incontinence 12/16/2014   Fast heart beat 12/16/2014   Calculus (=stone) 12/16/2014   OSA (obstructive sleep apnea) 11/27/2014   Dyspnea 10/07/2014   Essential hypertension 10/07/2014   Obesity 10/07/2014   Genital warts 08/02/2014   Condyloma acuminatum of vulva 08/02/2014   Condylomata lata of vulva 08/02/2014   Anticoagulation adequate 08/02/2014   Current tobacco use 08/02/2014   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 10 capsule 0   Cholecalciferol (VITAMIN D3) 1000 units CAPS Take by mouth.     DULoxetine  (CYMBALTA ) 20 MG capsule Take 1 capsule (20 mg total) by mouth daily. 90 capsule 0   FARXIGA  10 MG TABS tablet Take 1 tablet (10 mg total) by mouth daily. 90 tablet 3   fluticasone  (FLONASE ) 50 MCG/ACT nasal spray Place 2 sprays into both nostrils daily. 16 g 6   glipiZIDE  (  GLUCOTROL ) 10 MG tablet Take 1 tablet (10 mg total) by mouth 2 (two) times daily before a meal. 180 tablet 3   irbesartan  (AVAPRO ) 75 MG tablet Take 37.5 mg by mouth daily.     metoprolol  tartrate (LOPRESSOR ) 100 MG tablet TAKE 1 & 1/2 TABS (150MG ) BY MOUTH 2 (TWO) TIMES DAILY. 270 tablet 3   nitroGLYCERIN   (NITROSTAT ) 0.4 MG SL tablet Place 1 tablet (0.4 mg total) under the tongue every 5 (five) minutes as needed. 25 tablet 2   pantoprazole  (PROTONIX ) 40 MG tablet TAKE 1 TABLET BY MOUTH EVERY DAY 90 tablet 2   promethazine -dextromethorphan (PROMETHAZINE -DM) 6.25-15 MG/5ML syrup Take 5 mLs by mouth 4 (four) times daily as needed for cough. 118 mL 0   RYBELSUS  7 MG TABS TAKE 1 TABLET (7 MG TOTAL) BY MOUTH DAILY 30 tablet 0   torsemide  (DEMADEX ) 20 MG tablet TAKE 2 TABLETS BY MOUTH EVERY DAY 180 tablet 3   traZODone  (DESYREL ) 50 MG tablet Take 1 tablet (50 mg total) by mouth at bedtime as needed for sleep. 90 tablet 1   vitamin B-12 (CYANOCOBALAMIN ) 1000 MCG tablet Take 1,000 mcg by mouth every other day.      XARELTO  15 MG TABS tablet TAKE 1 TABLET (15 MG TOTAL) BY MOUTH DAILY WITH SUPPER 90 tablet 3   No current facility-administered medications for this visit.    Allergies: Ciprofloxacin  and Macrobid  [nitrofurantoin ]  Past Medical History:  Diagnosis Date   Abnormal Pap smear of vagina and vaginal HPV    Anticoagulation adequate 08/02/2014   Overview:  Xarelto  20hs; CHADS=1+ htn, female, age  Overview:  Xarelto  20hs; CHADS=1+ htn, female, age   Arthritis    "bones ache" (01/23/2015)   Atrial fibrillation, persistent (HCC) 08/13/2015   Overview:  symptomatic (fatigue, DOE, decreased exercise tolerance, palpitations), drug refractory (propafenone, amiodarone), complicated recovery from DCCV possibly related to repiratory obstruction [facility] (patient fear of DCCV)  Overview:  symptomatic (fatigue, DOE, decreased exercise tolerance, palpitations), drug refractory (propafenone, amiodarone), complicated recovery from DCCV possibly relat   Benign neoplasm of ascending colon    Benign neoplasm of descending colon    Benign neoplasm of sigmoid colon    Breath shortness 08/20/2013   CHF (congestive heart failure) (HCC)    Chronic atrial fibrillation (HCC)    Chronic combined systolic and diastolic CHF,  NYHA class 3 (HCC) 01/28/2015   Essential hypertension 10/07/2014   Trial off acei 10/07/2014  Due to atypical sob (talking = walking)> no real change 11/18/2014     Family history of adverse reaction to anesthesia    sister gets very nauseated and vomits   GERD (gastroesophageal reflux disease)    History of kidney stones    HPV (human papilloma virus) anogenital infection    03/21/12 PAP + HR HPV   Mitral regurgitation 04/23/2013   Overview:  "Moderate to severe" by TTE, "mild to mod" by TEE  Overview:  "Moderate to severe" by TTE, "mild to mod" by TEE   Obesity    OSA (obstructive sleep apnea) 11/27/2014   07/2013 RDI 17/h CPAP 8    Other specified diseases of esophagus    Postoperative nausea and vomiting 1983   many years ago with hysterectomy   PVC (premature ventricular contraction) 12/16/2014   TI (tricuspid incompetence) 04/23/2013   Overview:  "moderate" by TTE    Urolithiasis    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 History of genital warts and vulvar condyloma  Past Surgical History:  Procedure Laterality Date   ABDOMINAL HYSTERECTOMY     APPENDECTOMY     BILATERAL SALPINGOOPHORECTOMY  2003   benign ovarian cancer   CARDIOVERSION  2014-2015   "[facility]"   COLONOSCOPY WITH PROPOFOL  N/A 06/10/2015   Procedure: COLONOSCOPY WITH PROPOFOL ;  Surgeon: Marnee Sink, MD;  Location: [facility] ENDOSCOPY;  Service: Endoscopy;  Laterality: N/A;   CORONARY STENT INTERVENTION N/A 01/18/2019   Procedure: CORONARY STENT INTERVENTION;  Surgeon: Lucendia Rusk, MD;  Location: [facility] INVASIVE CV LAB;  Service: Cardiovascular;  Laterality: N/A;   CYSTOSCOPY W/ RETROGRADES Bilateral 10/19/2016   Procedure: CYSTOSCOPY WITH RETROGRADE PYELOGRAM;  Surgeon: Dustin Gimenez, MD;  Location: [facility] ORS;  Service: Urology;  Laterality: Bilateral;   CYSTOSCOPY W/ URETERAL STENT PLACEMENT Left 12/29/2016   Procedure: CYSTOSCOPY WITH STENT REPLACEMENT;  Surgeon:  Dustin Gimenez, MD;  Location: [facility] ORS;  Service: Urology;  Laterality: Left;   CYSTOSCOPY WITH BIOPSY N/A 10/19/2016   Procedure: CYSTOSCOPY WITH BLADDER BIOPSY, VAGINAL WALL BIOPSY;  Surgeon: Dustin Gimenez, MD;  Location: [facility] ORS;  Service: Urology;  Laterality: N/A;   CYSTOSCOPY WITH STENT PLACEMENT Left 12/14/2016   Procedure: CYSTOSCOPY WITH STENT PLACEMENT;  Surgeon: Dustin Gimenez, MD;  Location: [facility] ORS;  Service: Urology;  Laterality: Left;   ESOPHAGOGASTRODUODENOSCOPY (EGD) WITH PROPOFOL  N/A 10/05/2016   Surgeon: Marnee Sink, MD;  Location: [facility] ENDOSCOPY;  Results: Barrett's Esophagus- repeat in 3 years 09/2019   LEFT HEART CATH AND CORONARY ANGIOGRAPHY N/A 01/18/2019   Procedure: LEFT HEART CATH AND CORONARY ANGIOGRAPHY;  Surgeon: Lucendia Rusk, MD;  Location: [facility] INVASIVE CV LAB;  Service: Cardiovascular;  Laterality: N/A;   LITHOTRIPSY     STONE EXTRACTION WITH BASKET Left 12/29/2016   Procedure: STONE EXTRACTION WITH BASKET;  Surgeon: Dustin Gimenez, MD;  Location: [facility] ORS;  Service: Urology;  Laterality: Left;   TONSILLECTOMY     URETEROSCOPY Left 12/29/2016   Procedure: URETEROSCOPY;  Surgeon: Dustin Gimenez, MD;  Location: [facility] ORS;  Service: Urology;  Laterality: Left;   VULVECTOMY Right 05/10/2014   Excisional biopsy of the superior right labial majora mass; [facility]   VULVECTOMY PARTIAL  07/02/2014   Re-excision and sentinel node dissection at [facility].     Family History  Problem Relation Age of Onset   Prostate cancer Brother 17       still living and well   Heart attack Father    Stroke Mother    Diabetes Mother    Hypertension Mother    Heart attack Brother    Pancreatic cancer Brother    Heart disease Sister    Breast cancer Neg Hx    Kidney cancer Neg Hx    Bladder Cancer Neg Hx     Social History   Tobacco Use   Smoking status: Former    Current packs/day: 0.00    Average packs/day: 1 pack/day for 50.0 years (50.0 ttl pk-yrs)     Types: Cigarettes    Start date: 05/21/1962    Quit date: 05/20/2012    Years since quitting: 11.1   Smokeless tobacco: Never   Tobacco comments:    smoking cessation materials not required  Substance Use Topics   Alcohol use: No    Alcohol/week: 0.0 standard drinks of alcohol    Subjective:   Concerned for fatigue/ swollen lymph nodes that started on Monday of this week; notes that left sided lymph node most prominent but does seem to have improved some in the past 24  hours; no fever; did recently return from family vacation to [place] and notes that the park was very crowded; granddaughter is also sick with URI type symptoms; No cough, congestion or changes in breathing from her baseline;    Objective:  Vitals:   07/14/23 1413  BP: 120/82  Pulse: 89  SpO2: 97%  Weight: 190 lb 9.6 oz (86.5 kg)  Height: 5' (1.524 m)    General: Well developed, well nourished, in no acute distress  Skin : Warm and dry.  Head: Normocephalic and atraumatic  Eyes: Sclera and conjunctiva clear; pupils round and reactive to light; extraocular movements intact  Ears: External normal; canals clear; tympanic membranes normal  Oropharynx: Pink, supple. No suspicious lesions  Neck: Supple without thyromegaly, left sided cervical adenopathy  Lungs: Respirations unlabored; clear to auscultation bilaterally without wheeze, rales, rhonchi  CVS exam: normal rate and regular rhythm.  Neurologic: Alert and oriented; speech intact; face symmetrical; moves all extremities well; CNII-XII intact without focal deficit   Assessment:  1. Lymphadenopathy, cervical   2. Reactive cervical lymphadenopathy     Plan:  Check CBC today; rapid strep, flu and COVID all negative; will treat with Cefdinir  300 mg bid x 5 days ( patient specifically requests because she has done well on this in the past); follow up worse, no better.   No follow-ups on file.  Orders Placed This Encounter  Procedures   CBC with  Differential/Platelet    Requested Prescriptions   Signed Prescriptions Disp Refills   cefdinir  (OMNICEF ) 300 MG capsule 10 capsule 0    Sig: Take 1 capsule (300 mg total) by mouth 2 (two) times daily for 5 days.

## 2023-07-14 NOTE — Telephone Encounter (Signed)
 Chief Complaint: Swollen Lymph nodes in the neck Symptoms: sore throat, runny nose, fatigue Frequency: Constant onset 2 days ago  Pertinent Negatives: Patient denies fever, chest pain, nausea, vomiting  Disposition: [] ED /[] Urgent Care (no appt availability in office) / [x] Appointment(In office/virtual)/ []  [place] [facility]/ [] Home Care/ [] Refused Recommended Disposition /[] [place] [facility]/ []  Follow-up with PCP Additional Notes: Patient's granddaughter states the patient has noticeable swollen lymph nodes in her neck. Patient states she has a runny nose and is feeling fatigued. Care advice was given and patient has been scheduled for an appointment today for evaluation.  Copied from CRM [id number]. Topic: Clinical - Red Word Triage >> Jul 14, 2023 10:50 AM Marlan Silva wrote: Red Word that prompted transfer to Nurse Triage: Patient grand daughter called in requesting a same day appointment for the patient. She states that the patient has not been feeling like herself and her neck glands are swollen and have been for the past two days. She is really concerned. Reason for Disposition  [1] Single large node AND [2] size > 1 inch (2.5 cm) AND [3] no fever  Answer Assessment - Initial Assessment Questions 1. LOCATION: "Where is the swollen node located?" "Is the matching node on the other side of the body also swollen?"      Both sides of  the neck  2. SIZE: "How big is the node?" (e.g., inches or centimeters; or compared to common objects such as pea, bean, marble, golf ball)      Unsure  3. ONSET: "When did the swelling start?"      2 days ago  4. NECK NODES: "Is there a sore throat, runny nose or other symptoms of a cold?"      Sore throat, runny nose  5. GROIN OR ARMPIT NODES: "Is there a sore, scratch, cut or painful red area on that arm or leg?"      No  6. FEVER: "Do you have a fever?" If Yes, ask: "What is it, how was it measured, and when did it start?"      No  7. CAUSE: "What  do you think is causing the swollen lymph nodes?"     Maybe a cold or something  8. OTHER SYMPTOMS: "Do you have any other symptoms?"     Fatigue  9. PREGNANCY: "Is there any chance you are pregnant?" "When was your last menstrual period?"     N/A  Protocols used: Lymph Nodes - Swollen-A-AH

## 2023-07-14 NOTE — Patient Instructions (Signed)
 Lymphadenopathy  Lymphadenopathy is when your lymph glands are swollen or larger than normal.  Lymph glands, also called lymph nodes, are clumps of tissue. They filter germs and waste from tissues in your body to your bloodstream. They're part of your body's defense system, or immune system. Lymphadenopathy has different causes, like infection, autoimmune disease, and cancer. Lymphadenopathy can happen wherever you have lymph nodes. The type you have depends on which nodes it's in, such as: Cervical lymphadenopathy. This is in the neck. Mediastinal lymphadenopathy. This is in the chest. Hilar lymphadenopathy. This is in the lungs. Axillary lymphadenopathy. This is in the armpits. Inguinal lymphadenopathy. This is in the groin. Sometimes, fluid and cells that fight infection build up in your lymph nodes. This happens when your immune system reacts to germs or other substances that get into your body. This makes lymph nodes swell and get bigger. Treatment is based on what's thought to be the cause. Sometimes, lymph nodes don't go back to normal size after treatment. If yours don't, your health care provider may order tests to help learn why your glands are still swollen and big. Follow these instructions at home:  Take over-the-counter and prescription medicines only as told by your provider. If you were prescribed antibiotics, do not stop using them, even if you start to feel better. If told, apply heat to swollen lymph nodes as told by your provider. Use the heat source that your provider recommends, such as a moist heat pack or a heating pad. Place a towel between your skin and the heat source. Leave the heat on only for the time told by your provider to avoid injury. If your skin turns bright red, remove the heat right away to prevent burns. The risk of burns is higher if you cannot feel pain, heat, or cold. Check your swollen lymph nodes every day for changes. Check other places where you have  lymph nodes as told. Check for changes such as: More swelling. Sudden growth in size. Redness or pain. Hardness. Contact a health care provider if: You have lymph nodes that: Are still swollen after 2 weeks. Have gotten bigger all of a sudden or the swelling spreads. Are red, painful, or hard. Fluid leaks from the skin near a swollen lymph node. You get a fever, chills, or night sweats. You feel tired. You have a sore throat. Your abdomen hurts. You lose weight without trying. This information is not intended to replace advice given to you by your health care provider. Make sure you discuss any questions you have with your health care provider. Document Revised: 06/02/2022 Document Reviewed: 06/02/2022 Elsevier Patient Education  2024 [employer].

## 2023-07-15 LAB — CBC WITH DIFFERENTIAL/PLATELET
Basophils Absolute: 0.1 10*3/uL (ref 0.0–0.1)
Basophils Relative: 0.9 % (ref 0.0–3.0)
Eosinophils Absolute: 0.1 10*3/uL (ref 0.0–0.7)
Eosinophils Relative: 0.9 % (ref 0.0–5.0)
HCT: 47.4 % — ABNORMAL HIGH (ref 36.0–46.0)
Hemoglobin: 15.6 g/dL — ABNORMAL HIGH (ref 12.0–15.0)
Lymphocytes Relative: 28.1 % (ref 12.0–46.0)
Lymphs Abs: 3 10*3/uL (ref 0.7–4.0)
MCHC: 33 g/dL (ref 30.0–36.0)
MCV: 96.2 fl (ref 78.0–100.0)
Monocytes Absolute: 1 10*3/uL (ref 0.1–1.0)
Monocytes Relative: 10 % (ref 3.0–12.0)
Neutro Abs: 6.3 10*3/uL (ref 1.4–7.7)
Neutrophils Relative %: 60.1 % (ref 43.0–77.0)
Platelets: 237 10*3/uL (ref 150.0–400.0)
RBC: 4.92 Mil/uL (ref 3.87–5.11)
RDW: 13.9 % (ref 11.5–15.5)
WBC: 10.5 10*3/uL (ref 4.0–10.5)

## 2023-07-20 ENCOUNTER — Ambulatory Visit (HOSPITAL_COMMUNITY): Attending: Cardiovascular Disease

## 2023-07-21 ENCOUNTER — Encounter (HOSPITAL_COMMUNITY): Payer: Self-pay | Admitting: Internal Medicine

## 2023-08-02 ENCOUNTER — Encounter: Payer: Self-pay | Admitting: Internal Medicine

## 2023-08-09 ENCOUNTER — Other Ambulatory Visit: Payer: Self-pay | Admitting: Family Medicine

## 2023-08-11 ENCOUNTER — Encounter: Payer: Self-pay | Admitting: Family Medicine

## 2023-08-12 ENCOUNTER — Encounter: Payer: Self-pay | Admitting: Family Medicine

## 2023-08-12 ENCOUNTER — Ambulatory Visit (INDEPENDENT_AMBULATORY_CARE_PROVIDER_SITE_OTHER): Admitting: Family Medicine

## 2023-08-12 VITALS — BP 128/78 | HR 93 | Temp 98.0°F | Resp 16 | Ht 61.0 in | Wt 188.0 lb

## 2023-08-12 DIAGNOSIS — R350 Frequency of micturition: Secondary | ICD-10-CM

## 2023-08-12 DIAGNOSIS — R5383 Other fatigue: Secondary | ICD-10-CM | POA: Diagnosis not present

## 2023-08-12 DIAGNOSIS — D582 Other hemoglobinopathies: Secondary | ICD-10-CM | POA: Diagnosis not present

## 2023-08-12 DIAGNOSIS — G4700 Insomnia, unspecified: Secondary | ICD-10-CM

## 2023-08-12 MED ORDER — TRAZODONE HCL 100 MG PO TABS: 100.0000 mg | ORAL_TABLET | Freq: Every day | ORAL | 1 refills | Status: AC

## 2023-08-12 NOTE — Patient Instructions (Signed)
Keep the diet clean and stay active.  Give us 2-3 business days to get the results of your labs back.   Let us know if you need anything.  

## 2023-08-12 NOTE — Progress Notes (Signed)
 CC: Fatigue  Subjective: Patient is a 78 y.o. female here for fatigue.  This started 3-4 weeks ago. She does not sleep well. Wakes up a few hrs and takes a trazodone  and wakes up a few more hrs later. She will sometimes go on social media after waking up as she will not be able to fall asleep.  Diet could be better.  She is not exercising routinely.  Mood is stable overall.  She tries to stay hydrated.  Unfortunately she urinates quite frequently several times per hour.  No bleeding, diarrhea, nausea/vomiting.  Past Medical History:  Diagnosis Date   Abnormal Pap smear of vagina and vaginal HPV    Anticoagulation adequate 08/02/2014   Overview:  Xarelto  20hs; CHADS=1+ htn, female, age  Overview:  Xarelto  20hs; CHADS=1+ htn, female, age   Arthritis    "bones ache" (01/23/2015)   Atrial fibrillation, persistent (HCC) 08/13/2015   Overview:  symptomatic (fatigue, DOE, decreased exercise tolerance, palpitations), drug refractory (propafenone, amiodarone), complicated recovery from DCCV possibly related to repiratory obstruction [facility] (patient fear of DCCV)  Overview:  symptomatic (fatigue, DOE, decreased exercise tolerance, palpitations), drug refractory (propafenone, amiodarone), complicated recovery from DCCV possibly relat   Benign neoplasm of ascending colon    Benign neoplasm of descending colon    Benign neoplasm of sigmoid colon    Breath shortness 08/20/2013   CHF (congestive heart failure) (HCC)    Chronic atrial fibrillation (HCC)    Chronic combined systolic and diastolic CHF, NYHA class 3 (HCC) 01/28/2015   Essential hypertension 10/07/2014   Trial off acei 10/07/2014  Due to atypical sob (talking = walking)> no real change 11/18/2014     Family history of adverse reaction to anesthesia    sister gets very nauseated and vomits   GERD (gastroesophageal reflux disease)    History of kidney stones    HPV (human papilloma virus) anogenital infection    03/21/12 PAP + HR HPV   Mitral  regurgitation 04/23/2013   Overview:  "Moderate to severe" by TTE, "mild to mod" by TEE  Overview:  "Moderate to severe" by TTE, "mild to mod" by TEE   Obesity    OSA (obstructive sleep apnea) 11/27/2014   07/2013 RDI 17/h CPAP 8    Other specified diseases of esophagus    Postoperative nausea and vomiting 1983   many years ago with hysterectomy   PVC (premature ventricular contraction) 12/16/2014   TI (tricuspid incompetence) 04/23/2013   Overview:  "moderate" by TTE    Urolithiasis    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 History of genital warts and vulvar condyloma    Objective: BP 128/78 (BP Location: Left Arm, Patient Position: Sitting)   Pulse 93   Temp 98 F (36.7 C) (Oral)   Resp 16   Ht 5\' 1"  (1.549 m)   Wt 188 lb (85.3 kg)   SpO2 98%   BMI 35.52 kg/m  General: Awake, appears stated age Heart: RRR, no LE edema Lungs: CTAB, no rales, wheezes or rhonchi. No accessory muscle use Psych: Age appropriate judgment and insight, normal affect and mood  Assessment and Plan: Insomnia, unspecified type  Fatigue, unspecified type - Plan: CBC, Comprehensive metabolic panel with GFR, TSH, T4, free  Urinary frequency - Plan: Urine Culture, Urinalysis  Chronic, not controlled.  Increase trazodone  from 50 mg nightly to 100 mg nightly.  Follow-up in 1 month to recheck this.  She has an appointment scheduled already. Probably due  to #1.  Will rule out metabolic causes. As above. The patient voiced understanding and agreement to the plan.  Shellie Dials [place], DO 08/12/23  3:05 PM

## 2023-08-13 ENCOUNTER — Ambulatory Visit: Payer: Self-pay | Admitting: Family Medicine

## 2023-08-13 LAB — URINE CULTURE
MICRO NUMBER:: 16495085
SPECIMEN QUALITY:: ADEQUATE

## 2023-08-13 LAB — URINALYSIS
Bilirubin Urine: NEGATIVE
Hgb urine dipstick: NEGATIVE
Ketones, ur: NEGATIVE
Nitrite: NEGATIVE
Protein, ur: NEGATIVE
Specific Gravity, Urine: 1.014 (ref 1.001–1.035)
pH: <=5 — AB (ref 5.0–8.0)

## 2023-08-17 LAB — TSH: TSH: 1.37 m[IU]/L (ref 0.40–4.50)

## 2023-08-17 LAB — CBC
HCT: 46.8 % — ABNORMAL HIGH (ref 35.0–45.0)
Hemoglobin: 15.8 g/dL — ABNORMAL HIGH (ref 11.7–15.5)
MCH: 31.7 pg (ref 27.0–33.0)
MCHC: 33.8 g/dL (ref 32.0–36.0)
MCV: 94 fL (ref 80.0–100.0)
MPV: 11.9 fL (ref 7.5–12.5)
Platelets: 241 10*3/uL (ref 140–400)
RBC: 4.98 10*6/uL (ref 3.80–5.10)
RDW: 12.7 % (ref 11.0–15.0)
WBC: 9.5 10*3/uL (ref 3.8–10.8)

## 2023-08-17 LAB — COMPREHENSIVE METABOLIC PANEL WITH GFR
AG Ratio: 1.2 (calc) (ref 1.0–2.5)
ALT: 22 U/L (ref 6–29)
AST: 19 U/L (ref 10–35)
Albumin: 4.2 g/dL (ref 3.6–5.1)
Alkaline phosphatase (APISO): 63 U/L (ref 37–153)
BUN/Creatinine Ratio: 19 (calc) (ref 6–22)
BUN: 28 mg/dL — ABNORMAL HIGH (ref 7–25)
CO2: 27 mmol/L (ref 20–32)
Calcium: 10.1 mg/dL (ref 8.6–10.4)
Chloride: 101 mmol/L (ref 98–110)
Creat: 1.46 mg/dL — ABNORMAL HIGH (ref 0.60–1.00)
Globulin: 3.6 g/dL (ref 1.9–3.7)
Glucose, Bld: 118 mg/dL — ABNORMAL HIGH (ref 65–99)
Potassium: 4.5 mmol/L (ref 3.5–5.3)
Sodium: 141 mmol/L (ref 135–146)
Total Bilirubin: 0.5 mg/dL (ref 0.2–1.2)
Total Protein: 7.8 g/dL (ref 6.1–8.1)
eGFR: 36 mL/min/{1.73_m2} — ABNORMAL LOW (ref >=60)

## 2023-08-17 LAB — TEST AUTHORIZATION

## 2023-08-17 LAB — T4, FREE: Free T4: 1.2 ng/dL (ref 0.8–1.8)

## 2023-08-17 LAB — ERYTHROPOIETIN: Erythropoietin: 14 m[IU]/mL (ref 2.6–18.5)

## 2023-08-29 ENCOUNTER — Other Ambulatory Visit: Payer: Self-pay | Admitting: Family Medicine

## 2023-08-29 ENCOUNTER — Other Ambulatory Visit: Payer: Self-pay

## 2023-08-29 MED ORDER — PANTOPRAZOLE SODIUM 40 MG PO TBEC: 40.0000 mg | DELAYED_RELEASE_TABLET | Freq: Every day | ORAL | 2 refills | Status: AC

## 2023-08-31 ENCOUNTER — Ambulatory Visit

## 2023-08-31 VITALS — BP 128/78 | Ht 61.0 in | Wt 188.0 lb

## 2023-08-31 DIAGNOSIS — Z0000 Encounter for general adult medical examination without abnormal findings: Secondary | ICD-10-CM | POA: Diagnosis not present

## 2023-08-31 NOTE — Progress Notes (Signed)
 Because this visit was a virtual/telehealth visit,  certain criteria was not obtained, such a blood pressure, CBG if applicable, and timed get up and go. Any medications not marked as taking were not mentioned during the medication reconciliation part of the visit. Any vitals not documented were not able to be obtained due to this being a telehealth visit or patient was unable to self-report a recent blood pressure reading due to a lack of equipment at home via telehealth. Vitals that have been documented are verbally provided by the patient.   This visit was performed by a medical professional under my direct supervision. I was immediately available for consultation/collaboration. I have reviewed and agree with the Annual Wellness Visit documentation.  Subjective:   Holly Smith is a 78 y.o. who presents for a Medicare Wellness preventive visit.  As a reminder, Annual Wellness Visits don't include a physical exam, and some assessments may be limited, especially if this visit is performed virtually. We may recommend an in-person follow-up visit with your provider if needed.  Visit Complete: Virtual I connected with  Holly Smith on 08/31/23 by a audio enabled telemedicine application and verified that I am speaking with the correct person using two identifiers.  Patient Location: Home  Provider Location: Home Office  I discussed the limitations of evaluation and management by telemedicine. The patient expressed understanding and agreed to proceed.  Vital Signs: Because this visit was a virtual/telehealth visit, some criteria may be missing or patient reported. Any vitals not documented were not able to be obtained and vitals that have been documented are patient reported.  VideoDeclined- This patient declined Librarian, academic. Therefore the visit was completed with audio only.  Persons Participating in Visit: Patient.  AWV Questionnaire: Yes:  Patient Medicare AWV questionnaire was completed by the patient on 08/30/2023; I have confirmed that all information answered by patient is correct and no changes since this date.  Cardiac Risk Factors include: advanced age (>72men, >46 women);obesity (BMI >30kg/m2);hypertension;Other (see comment);dyslipidemia, Risk factor comments: copd     Objective:     Today's Vitals   08/31/23 1518  BP: 128/78  Weight: 188 lb (85.3 kg)  Height: 5' 1 (1.549 m)   Body mass index is 35.52 kg/m.     08/31/2023    3:18 PM 08/18/2022    9:30 PM 07/28/2022    1:55 AM 07/27/2022    3:20 PM 06/29/2022    9:46 AM 01/18/2019    7:54 AM 12/19/2018    4:13 PM  Advanced Directives  Does Patient Have a Medical Advance Directive? No No  No No No No  Would patient like information on creating a medical advance directive? No - Patient declined  Yes (Inpatient - patient defers creating a medical advance directive at this time - Information given)  No - Patient declined No - Patient declined     Current Medications (verified) Outpatient Encounter Medications as of 08/31/2023  Medication Sig   Cholecalciferol (VITAMIN D3) 1000 units CAPS Take by mouth.   DULoxetine  (CYMBALTA ) 20 MG capsule Take 1 capsule (20 mg total) by mouth daily.   FARXIGA  10 MG TABS tablet Take 1 tablet (10 mg total) by mouth daily.   fluticasone  (FLONASE ) 50 MCG/ACT nasal spray Place 2 sprays into both nostrils daily.   glipiZIDE  (GLUCOTROL ) 10 MG tablet Take 1 tablet (10 mg total) by mouth 2 (two) times daily before a meal.   irbesartan  (AVAPRO ) 75 MG tablet Take 37.5 mg  by mouth daily.   metoprolol  tartrate (LOPRESSOR ) 100 MG tablet TAKE 1 & 1/2 TABS (150MG ) BY MOUTH 2 (TWO) TIMES DAILY.   nitroGLYCERIN  (NITROSTAT ) 0.4 MG SL tablet Place 1 tablet (0.4 mg total) under the tongue every 5 (five) minutes as needed.   pantoprazole  (PROTONIX ) 40 MG tablet Take 1 tablet (40 mg total) by mouth daily.   Semaglutide  (RYBELSUS ) 7 MG TABS Take 1  tablet (7 mg total) by mouth daily.   torsemide  (DEMADEX ) 20 MG tablet TAKE 2 TABLETS BY MOUTH EVERY DAY   traZODone  (DESYREL ) 100 MG tablet Take 1 tablet (100 mg total) by mouth at bedtime.   vitamin B-12 (CYANOCOBALAMIN ) 1000 MCG tablet Take 1,000 mcg by mouth every other day.    XARELTO  15 MG TABS tablet TAKE 1 TABLET (15 MG TOTAL) BY MOUTH DAILY WITH SUPPER   [DISCONTINUED] potassium chloride  SA (K-DUR,KLOR-CON ) 20 MEQ tablet Take 20 mEq by mouth daily.   No facility-administered encounter medications on file as of 08/31/2023.    Allergies (verified) Ciprofloxacin  and Macrobid  [nitrofurantoin ]   History: Past Medical History:  Diagnosis Date   Abnormal Pap smear of vagina and vaginal HPV    Anticoagulation adequate 08/02/2014   Overview:  Xarelto  20hs; CHADS=1+ htn, female, age  Overview:  Xarelto  20hs; CHADS=1+ htn, female, age   Arthritis    bones ache (01/23/2015)   Atrial fibrillation, persistent (HCC) 08/13/2015   Overview:  symptomatic (fatigue, DOE, decreased exercise tolerance, palpitations), drug refractory (propafenone, amiodarone), complicated recovery from DCCV possibly related to repiratory obstruction [facility] (patient fear of DCCV)  Overview:  symptomatic (fatigue, DOE, decreased exercise tolerance, palpitations), drug refractory (propafenone, amiodarone), complicated recovery from DCCV possibly relat   Benign neoplasm of ascending colon    Benign neoplasm of descending colon    Benign neoplasm of sigmoid colon    Breath shortness 08/20/2013   CHF (congestive heart failure) (HCC)    Chronic atrial fibrillation (HCC)    Chronic combined systolic and diastolic CHF, NYHA class 3 (HCC) 01/28/2015   Essential hypertension 10/07/2014   Trial off acei 10/07/2014  Due to atypical sob (talking = walking)> no real change 11/18/2014     Family history of adverse reaction to anesthesia    sister gets very nauseated and vomits   GERD (gastroesophageal reflux disease)    History of  kidney stones    HPV (human papilloma virus) anogenital infection    03/21/12 PAP + HR HPV   Mitral regurgitation 04/23/2013   Overview:  Moderate to severe by TTE, mild to mod by TEE  Overview:  Moderate to severe by TTE, mild to mod by TEE   Obesity    OSA (obstructive sleep apnea) 11/27/2014   07/2013 RDI 17/h CPAP 8    Other specified diseases of esophagus    Postoperative nausea and vomiting 1983   many years ago with hysterectomy   PVC (premature ventricular contraction) 12/16/2014   TI (tricuspid incompetence) 04/23/2013   Overview:  moderate by TTE    Urolithiasis    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 History of genital warts and vulvar condyloma   Past Surgical History:  Procedure Laterality Date   ABDOMINAL HYSTERECTOMY     APPENDECTOMY     BILATERAL SALPINGOOPHORECTOMY  2003   benign ovarian cancer   CARDIOVERSION  2014-2015   [facility]   COLONOSCOPY WITH PROPOFOL  N/A 06/10/2015   Procedure: COLONOSCOPY WITH PROPOFOL ;  Surgeon: Marnee Sink, MD;  Location:  [facility] ENDOSCOPY;  Service: Endoscopy;  Laterality: N/A;   CORONARY STENT INTERVENTION N/A 01/18/2019   Procedure: CORONARY STENT INTERVENTION;  Surgeon: Lucendia Rusk, MD;  Location: [facility] INVASIVE CV LAB;  Service: Cardiovascular;  Laterality: N/A;   CYSTOSCOPY W/ RETROGRADES Bilateral 10/19/2016   Procedure: CYSTOSCOPY WITH RETROGRADE PYELOGRAM;  Surgeon: Dustin Gimenez, MD;  Location: [facility] ORS;  Service: Urology;  Laterality: Bilateral;   CYSTOSCOPY W/ URETERAL STENT PLACEMENT Left 12/29/2016   Procedure: CYSTOSCOPY WITH STENT REPLACEMENT;  Surgeon: Dustin Gimenez, MD;  Location: [facility] ORS;  Service: Urology;  Laterality: Left;   CYSTOSCOPY WITH BIOPSY N/A 10/19/2016   Procedure: CYSTOSCOPY WITH BLADDER BIOPSY, VAGINAL WALL BIOPSY;  Surgeon: Dustin Gimenez, MD;  Location: [facility] ORS;  Service: Urology;  Laterality: N/A;   CYSTOSCOPY WITH STENT PLACEMENT Left  12/14/2016   Procedure: CYSTOSCOPY WITH STENT PLACEMENT;  Surgeon: Dustin Gimenez, MD;  Location: [facility] ORS;  Service: Urology;  Laterality: Left;   ESOPHAGOGASTRODUODENOSCOPY (EGD) WITH PROPOFOL  N/A 10/05/2016   Surgeon: Marnee Sink, MD;  Location: [facility] ENDOSCOPY;  Results: Barrett's Esophagus- repeat in 3 years 09/2019   LEFT HEART CATH AND CORONARY ANGIOGRAPHY N/A 01/18/2019   Procedure: LEFT HEART CATH AND CORONARY ANGIOGRAPHY;  Surgeon: Lucendia Rusk, MD;  Location: [facility] INVASIVE CV LAB;  Service: Cardiovascular;  Laterality: N/A;   LITHOTRIPSY     STONE EXTRACTION WITH BASKET Left 12/29/2016   Procedure: STONE EXTRACTION WITH BASKET;  Surgeon: Dustin Gimenez, MD;  Location: [facility] ORS;  Service: Urology;  Laterality: Left;   TONSILLECTOMY     URETEROSCOPY Left 12/29/2016   Procedure: URETEROSCOPY;  Surgeon: Dustin Gimenez, MD;  Location: [facility] ORS;  Service: Urology;  Laterality: Left;   VULVECTOMY Right 05/10/2014   Excisional biopsy of the superior right labial majora mass; [facility]   VULVECTOMY PARTIAL  07/02/2014   Re-excision and sentinel node dissection at [facility].    Family History  Problem Relation Age of Onset   Prostate cancer Brother 32       still living and well   Heart attack Father    Stroke Mother    Diabetes Mother    Hypertension Mother    Heart attack Brother    Pancreatic cancer Brother    Heart disease Sister    Breast cancer Neg Hx    Kidney cancer Neg Hx    Bladder Cancer Neg Hx    Social History   Socioeconomic History   Marital status: Divorced    Spouse name: Not on file   Number of children: 1   Years of education: GED   Highest education level: 12th grade  Occupational History   Occupation: Retired  Tobacco Use   Smoking status: Former    Current packs/day: 0.00    Average packs/day: 1 pack/day for 50.0 years (50.0 ttl pk-yrs)    Types: Cigarettes    Start date: 05/21/1962    Quit date: 05/20/2012    Years since quitting: 11.2    Smokeless tobacco: Never   Tobacco comments:    smoking cessation materials not required  Vaping Use   Vaping status: Never Used  Substance and Sexual Activity   Alcohol use: No    Alcohol/week: 0.0 standard drinks of alcohol   Drug use: No   Sexual activity: Not Currently  Other Topics Concern   Not on file  Social History Narrative   Not on file   Social Drivers of Health   Financial Resource Strain: Low Risk  (08/31/2023)  Overall Financial Resource Strain (CARDIA)    Difficulty of Paying Living Expenses: Not hard at all  Food Insecurity: No Food Insecurity (08/31/2023)   Hunger Vital Sign    Worried About Running Out of Food in the Last Year: Never true    Ran Out of Food in the Last Year: Never true  Transportation Needs: No Transportation Needs (08/31/2023)   PRAPARE - Administrator, Civil Service (Medical): No    Lack of Transportation (Non-Medical): No  Physical Activity: Inactive (08/31/2023)   Exercise Vital Sign    Days of Exercise per Week: 0 days    Minutes of Exercise per Session: 20 min  Stress: No Stress Concern Present (08/31/2023)   [employer] of Occupational Health - Occupational Stress Questionnaire    Feeling of Stress : Not at all  Social Connections: Socially Isolated (08/31/2023)   Social Connection and Isolation Panel [NHANES]    Frequency of Communication with Friends and Family: More than three times a week    Frequency of Social Gatherings with Friends and Family: More than three times a week    Attends Religious Services: Never    Database administrator or Organizations: No    Attends Banker Meetings: Never    Marital Status: Widowed    Tobacco Counseling Counseling given: Not Answered Tobacco comments: smoking cessation materials not required    Clinical Intake:  Pre-visit preparation completed: Yes  Pain : No/denies pain     BMI - recorded: 35.52 Nutritional Status: BMI > 30  Obese Nutritional  Risks: None Diabetes: Yes CBG done?: No Did pt. bring in CBG monitor from home?: No  Lab Results  Component Value Date   HGBA1C 6.5 06/20/2023   HGBA1C 7.9 (H) 11/10/2022   HGBA1C 7.8 (H) 07/28/2022     How often do you need to have someone help you when you read instructions, pamphlets, or other written materials from your doctor or pharmacy?: 1 - Never What is the last grade level you completed in school?: GED     Information entered by :: Almeda Ezra,cma   Activities of Daily Living     08/30/2023    9:49 AM  In your present state of health, do you have any difficulty performing the following activities:  Hearing? 0   Vision? 0   Difficulty concentrating or making decisions? 0   Walking or climbing stairs? 1   Dressing or bathing? 0  Doing errands, shopping? 0   Preparing Food and eating ? N   Using the Toilet? N   In the past six months, have you accidently leaked urine? Y   Do you have problems with loss of bowel control? Y   Managing your Medications? N   Managing your Finances? N   Housekeeping or managing your Housekeeping? N      Proxy-reported    Patient Care Team: Jobe Mulder, DO as PCP - General (Family Medicine) Thukkani, Arun K, MD as PCP - Cardiology (Cardiology) Lucendia Rusk, MD as Consulting Physician (Cardiology) Jolly Needle, MD (Inactive) as Consulting Physician (Cardiology) Dustin Gimenez, MD as Consulting Physician (Urology) Marnee Sink, MD as Consulting Physician (Gastroenterology) Hermine Loots, MD as Consulting Physician (Obstetrics and Gynecology) Diamond Formica, MD as Consulting Physician (Pulmonary Disease) Adelaida Holts, MD as Consulting Physician (Specialist) Rochell Chroman, RN as Oncology Nurse Navigator Poudyal, Ritesh, OD (Optometry)  I have updated your Care Teams any recent Medical Services you may have received  from other providers in the past year.     Assessment:    This is a  routine wellness examination for Holly Smith.  Hearing/Vision screen Hearing Screening - Comments:: Patient has no difficulties Vision Screening - Comments:: Patient has had a eye procedures   Goals Addressed             This Visit's Progress    DIET - INCREASE WATER INTAKE   On track    Recommend to drink at least 6-8 8oz glasses of water per day.       Depression Screen     08/31/2023    3:23 PM 08/12/2023    1:32 PM 07/14/2023    2:20 PM 06/29/2022    9:48 AM 04/27/2022    3:49 PM 04/13/2017    1:02 PM 03/18/2016    9:38 AM  PHQ 2/9 Scores  PHQ - 2 Score 0 0 0 0 0 0 3  PHQ- 9 Score 1    2  12     Fall Risk     08/30/2023    9:49 AM 08/12/2023    1:32 PM 06/28/2022    7:24 PM 04/27/2022    3:49 PM 04/13/2017    1:02 PM  Fall Risk   Falls in the past year? 0  0 0 0 No  Number falls in past yr: 0  0 0 0   Injury with Fall? 0  0 0 0   Risk for fall due to : No Fall Risks  No Fall Risks No Fall Risks   Follow up Falls evaluation completed;Falls prevention discussed Falls evaluation completed Falls evaluation completed Falls evaluation completed      Proxy-reported    MEDICARE RISK AT HOME:  Medicare Risk at Home Any stairs in or around the home?: (Proxy-Rptd) Yes If so, are there any without handrails?: (Proxy-Rptd) No Home free of loose throw rugs in walkways, pet beds, electrical cords, etc?: (Proxy-Rptd) No Adequate lighting in your home to reduce risk of falls?: (Proxy-Rptd) Yes Life alert?: (Proxy-Rptd) No Use of a cane, walker or w/c?: (Proxy-Rptd) No Grab bars in the bathroom?: (Proxy-Rptd) No Shower chair or bench in shower?: (Proxy-Rptd) Yes Elevated toilet seat or a handicapped toilet?: (Proxy-Rptd) No  TIMED UP AND GO:  Was the test performed?  No  Cognitive Function: 6CIT completed        08/31/2023    3:20 PM 06/29/2022    9:56 AM 04/13/2017    2:26 PM  6CIT Screen  What Year? 0 points 0 points 0 points  What month? 0 points 0 points 0 points  What time?  0 points 0 points 0 points  Count back from 20 0 points 2 points 0 points  Months in reverse 0 points 0 points 0 points  Repeat phrase 0 points 0 points 0 points  Total Score 0 points 2 points 0 points    Immunizations Immunization History  Administered Date(s) Administered   Influenza, High Dose Seasonal PF 02/15/2017   Influenza,inj,Quad PF,6+ Mos 01/24/2015   Influenza-Unspecified 03/27/2018   Pneumococcal Conjugate-13 04/13/2017   Pneumococcal Polysaccharide-23 01/24/2015    Screening Tests Health Maintenance  Topic Date Due   DTaP/Tdap/Td (1 - Tdap) Never done   MAMMOGRAM  06/22/2023   OPHTHALMOLOGY EXAM  06/25/2023   Lung Cancer Screening  07/27/2023   INFLUENZA VACCINE  10/21/2023   HEMOGLOBIN A1C  12/20/2023   Diabetic kidney evaluation - Urine ACR  06/19/2024   FOOT EXAM  06/19/2024  Diabetic kidney evaluation - eGFR measurement  08/11/2024   Medicare Annual Wellness (AWV)  08/30/2024   Pneumonia Vaccine 75+ Years old  Completed   DEXA SCAN  Completed   Hepatitis C Screening  Completed   HPV VACCINES  Aged Out   Meningococcal B Vaccine  Aged Out   Colonoscopy  Discontinued   COVID-19 Vaccine  Discontinued   Zoster Vaccines- Shingrix  Discontinued    Health Maintenance  Health Maintenance Due  Topic Date Due   DTaP/Tdap/Td (1 - Tdap) Never done   MAMMOGRAM  06/22/2023   OPHTHALMOLOGY EXAM  06/25/2023   Lung Cancer Screening  07/27/2023   Health Maintenance Items Addressed:   Additional Screening:  Vision Screening: Recommended annual ophthalmology exams for early detection of glaucoma and other disorders of the eye. Would you like a referral to an eye doctor? No    Dental Screening: Recommended annual dental exams for proper oral hygiene  Community Resource Referral / Chronic Care Management: CRR required this visit?  No   CCM required this visit?  No   Plan:    I have personally reviewed and noted the following in the patient's chart:    Medical and social history Use of alcohol, tobacco or illicit drugs  Current medications and supplements including opioid prescriptions. Patient is not currently taking opioid prescriptions. Functional ability and status Nutritional status Physical activity Advanced directives List of other physicians Hospitalizations, surgeries, and ER visits in previous 12 months Vitals Screenings to include cognitive, depression, and falls Referrals and appointments  In addition, I have reviewed and discussed with patient certain preventive protocols, quality metrics, and best practice recommendations. A written personalized care plan for preventive services as well as general preventive health recommendations were provided to patient.   Freeda Jerry, [place]   08/31/2023   After Visit Summary: (MyChart) Due to this being a telephonic visit, the after visit summary with patients personalized plan was offered to patient via MyChart   Notes: Nothing significant to report at this time.

## 2023-08-31 NOTE — Patient Instructions (Signed)
 Ms. Holly Smith , Thank you for taking time out of your busy schedule to complete your Annual Wellness Visit with me. I enjoyed our conversation and look forward to speaking with you again next year. I, as well as your care team,  appreciate your ongoing commitment to your health goals. Please review the following plan we discussed and let me know if I can assist you in the future. Your Game plan/ To Do List    Referrals: If you haven't heard from the office you've been referred to, please reach out to them at the phone provided.  none Follow up Visits: Next Medicare AWV with our clinical staff: 09/05/2024   Have you seen your provider in the last 6 months (3 months if uncontrolled diabetes)? No Next Office Visit with your provider:   Clinician Recommendations:  Aim for 30 minutes of exercise or brisk walking, 6-8 glasses of water, and 5 servings of fruits and vegetables each day.       This is a list of the screening recommended for you and due dates:  Health Maintenance  Topic Date Due   DTaP/Tdap/Td vaccine (1 - Tdap) Never done   Mammogram  06/22/2023   Eye exam for diabetics  06/25/2023   Screening for Lung Cancer  07/27/2023   Flu Shot  10/21/2023   Hemoglobin A1C  12/20/2023   Yearly kidney health urinalysis for diabetes  06/19/2024   Complete foot exam   06/19/2024   Yearly kidney function blood test for diabetes  08/11/2024   Medicare Annual Wellness Visit  08/30/2024   Pneumonia Vaccine  Completed   DEXA scan (bone density measurement)  Completed   Hepatitis C Screening  Completed   HPV Vaccine  Aged Out   Meningitis B Vaccine  Aged Out   Colon Cancer Screening  Discontinued   COVID-19 Vaccine  Discontinued   Zoster (Shingles) Vaccine  Discontinued    Advanced directives: (Declined) Advance directive discussed with you today. Even though you declined this today, please call our office should you change your mind, and we can give you the proper paperwork for you to fill  out. Advance Care Planning is important because it:  [x]  Makes sure you receive the medical care that is consistent with your values, goals, and preferences  [x]  It provides guidance to your family and loved ones and reduces their decisional burden about whether or not they are making the right decisions based on your wishes.  Follow the link provided in your after visit summary or read over the paperwork we have mailed to you to help you started getting your Advance Directives in place. If you need assistance in completing these, please reach out to us  so that we can help you!  See attachments for Preventive Care and Fall Prevention Tips.

## 2023-09-06 ENCOUNTER — Other Ambulatory Visit: Payer: Self-pay | Admitting: Family Medicine

## 2023-09-09 ENCOUNTER — Ambulatory Visit: Payer: Self-pay

## 2023-09-09 NOTE — Telephone Encounter (Signed)
 Copied from CRM [id number]. Topic: Clinical - Red Word Triage >> Sep 09, 2023  3:42 PM Kita Perish H wrote: [facility] that prompted transfer to Nurse Triage: Patients granddaughter states patient has IBS, but has been extremely fatigued and sick for 2 days thinks she has cdif again, stools are really watery   Reason for Disposition  [1] MODERATE diarrhea (e.g., 4-6 times / day more than normal) AND [2] present > 48 hours (2 days)  Answer Assessment - Initial Assessment Questions 1. DIARRHEA SEVERITY: How bad is the diarrhea? How many more stools have you had in the past 24 hours than normal?    - NO DIARRHEA (SCALE 0)   - MILD (SCALE 1-3): Few loose or mushy BMs; increase of 1-3 stools over normal daily number of stools; mild increase in ostomy output.   -  MODERATE (SCALE 4-7): Increase of 4-6 stools daily over normal; moderate increase in ostomy output.   -  SEVERE (SCALE 8-10; OR WORST POSSIBLE): Increase of 7 or more stools daily over normal; moderate increase in ostomy output; incontinence.     Moderate  2. ONSET: When did the diarrhea begin?      2 days ago 3. BM CONSISTENCY: How loose or watery is the diarrhea?      Watery  4. VOMITING: Are you also vomiting? If Yes, ask: How many times in the past 24 hours?      No 5. ABDOMEN PAIN: Are you having any abdomen pain? If Yes, ask: What does it feel like? (e.g., crampy, dull, intermittent, constant)      Unsure 6. ABDOMEN PAIN SEVERITY: If present, ask: How bad is the pain?  (e.g., Scale 1-10; mild, moderate, or severe)   - MILD (1-3): doesn't interfere with normal activities, abdomen soft and not tender to touch    - MODERATE (4-7): interferes with normal activities or awakens from sleep, abdomen tender to touch    - SEVERE (8-10): excruciating pain, doubled over, unable to do any normal activities       Unsure  7. ORAL INTAKE: If vomiting, Have you been able to drink liquids? How much liquids have you had in the past  24 hours?     Lower PO intake  8. HYDRATION: Any signs of dehydration? (e.g., dry mouth [not just dry lips], too weak to stand, dizziness, new weight loss) When did you last urinate?     Nothing out of the ordinary for the patient  9. EXPOSURE: Have you traveled to a foreign country recently? Have you been exposed to anyone with diarrhea? Could you have eaten any food that was spoiled?     No 10. ANTIBIOTIC USE: Are you taking antibiotics now or have you taken antibiotics in the past 2 months?       08/12/23 11. OTHER SYMPTOMS: Do you have any other symptoms? (e.g., fever, blood in stool)       Nausea  Protocols used: Diarrhea-A-AH    FYI Only or Action Required?: FYI only for provider.  Patient was last seen in primary care on 08/12/2023 by Jobe Mulder, DO. Called Nurse Triage reporting Diarrhea. Symptoms began several days ago. Interventions attempted: Nothing. Symptoms are: gradually worsening.  Triage Disposition: See Physician Within 24 Hours Patient with chronic diarrhea, appointment 6/23 per family request, Instructed to take her to the ED or UC over the weekend for worsening symptoms which they are agreeable with.   Patient/caregiver understands and will follow disposition?: Yes

## 2023-09-12 ENCOUNTER — Ambulatory Visit (INDEPENDENT_AMBULATORY_CARE_PROVIDER_SITE_OTHER): Admitting: Family Medicine

## 2023-09-12 ENCOUNTER — Encounter: Payer: Self-pay | Admitting: Family Medicine

## 2023-09-12 VITALS — BP 130/78 | HR 84 | Temp 98.0°F | Resp 16 | Ht 61.0 in | Wt 188.0 lb

## 2023-09-12 DIAGNOSIS — K529 Noninfective gastroenteritis and colitis, unspecified: Secondary | ICD-10-CM | POA: Diagnosis not present

## 2023-09-12 NOTE — Progress Notes (Signed)
 Chief Complaint  Patient presents with   Diarrhea    Diarrhea     Holly Smith is 78 y.o. female here for complaint of diarrhea.  Duration: 2 months Abdominal pain? Yes upper L/R abd pain Bleeding? No Recent travel? No Recent antibiotic use? No Sick contacts? No Fevers? No Therapies tried: none Stress levels are stable No obvious food triggers.   Past Medical History:  Diagnosis Date   Abnormal Pap smear of vagina and vaginal HPV    Anticoagulation adequate 08/02/2014   Overview:  Xarelto  20hs; CHADS=1+ htn, female, age  Overview:  Xarelto  20hs; CHADS=1+ htn, female, age   Arthritis    bones ache (01/23/2015)   Atrial fibrillation, persistent (HCC) 08/13/2015   Overview:  symptomatic (fatigue, DOE, decreased exercise tolerance, palpitations), drug refractory (propafenone, amiodarone), complicated recovery from DCCV possibly related to repiratory obstruction [facility] (patient fear of DCCV)  Overview:  symptomatic (fatigue, DOE, decreased exercise tolerance, palpitations), drug refractory (propafenone, amiodarone), complicated recovery from DCCV possibly relat   Benign neoplasm of ascending colon    Benign neoplasm of descending colon    Benign neoplasm of sigmoid colon    Breath shortness 08/20/2013   CHF (congestive heart failure) (HCC)    Chronic atrial fibrillation (HCC)    Chronic combined systolic and diastolic CHF, NYHA class 3 (HCC) 01/28/2015   Essential hypertension 10/07/2014   Trial off acei 10/07/2014  Due to atypical sob (talking = walking)> no real change 11/18/2014     Family history of adverse reaction to anesthesia    sister gets very nauseated and vomits   GERD (gastroesophageal reflux disease)    History of kidney stones    HPV (human papilloma virus) anogenital infection    03/21/12 PAP + HR HPV   Mitral regurgitation 04/23/2013   Overview:  Moderate to severe by TTE, mild to mod by TEE  Overview:  Moderate to severe by TTE, mild to mod by TEE    Obesity    OSA (obstructive sleep apnea) 11/27/2014   07/2013 RDI 17/h CPAP 8    Other specified diseases of esophagus    Postoperative nausea and vomiting 1983   many years ago with hysterectomy   PVC (premature ventricular contraction) 12/16/2014   TI (tricuspid incompetence) 04/23/2013   Overview:  moderate by TTE    Urolithiasis    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 History of genital warts and vulvar condyloma    BP 130/78 (BP Location: Left Arm, Patient Position: Sitting)   Pulse 84   Temp 98 F (36.7 C) (Oral)   Resp 16   Ht 5' 1 (1.549 m)   Wt 188 lb (85.3 kg)   SpO2 96%   BMI 35.52 kg/m  Gen: awake, alert, appearing stated age HEENT: MMM Heart: RRR Lungs: CTAB, no accessory muscle use Abd: BS+, soft, TTP, non-distended, no masses or organomegaly Psych: Age appropriate judgment and insight  Chronic diarrhea - Plan: GI Profile, Stool, PCR, Ambulatory referral to Gastroenterology  Encouraged adequate hydration.  Check GI profile.  Add fiber.  Refer to GI as a contingency.  If no significant improvement in the next few weeks with the above recommendations, will consider holding Rybelsus . F/u as originally scheduled. The patient voiced understanding and agreement to the plan.  Mabel [place], DO 09/12/23 1:29 PM

## 2023-09-12 NOTE — Patient Instructions (Addendum)
 Stay hydrated.  Take Metamucil or Benefiber daily.  We will be in touch with your stool results.   If you do not hear anything about your referral in the next 1-2 weeks, call our office and ask for an update.  Let us  know if you need anything.

## 2023-09-12 NOTE — Addendum Note (Signed)
 Addended by: TRUDY CURVIN RAMAN on: 09/12/2023 03:05 PM   Modules accepted: Orders

## 2023-09-14 ENCOUNTER — Other Ambulatory Visit

## 2023-09-14 DIAGNOSIS — K529 Noninfective gastroenteritis and colitis, unspecified: Secondary | ICD-10-CM

## 2023-09-15 ENCOUNTER — Ambulatory Visit: Payer: Self-pay | Admitting: Family Medicine

## 2023-09-15 LAB — GI PROFILE, STOOL, PCR

## 2023-10-06 ENCOUNTER — Encounter: Payer: Self-pay | Admitting: Internal Medicine

## 2023-10-08 ENCOUNTER — Other Ambulatory Visit: Payer: Self-pay | Admitting: Family Medicine

## 2023-10-08 DIAGNOSIS — E1142 Type 2 diabetes mellitus with diabetic polyneuropathy: Secondary | ICD-10-CM

## 2023-10-11 NOTE — Progress Notes (Unsigned)
 Patient: Holly Smith Date of Birth: Dec 28, 1945  Reason for Visit: Follow up History from: Patient Primary Neurologist: Dohmeier  Virtual Visit via Video Note  I connected with Annita Jonne Pool on 10/12/23 at  2:30 PM EDT by a video enabled telemedicine application and verified that I am speaking with the correct person using two identifiers.  Location: Patient: at her home Provider: in the office    I discussed the limitations of evaluation and management by telemedicine and the availability of in person appointments. The patient expressed understanding and agreed to proceed.  ASSESSMENT AND PLAN 78 y.o. year old female   1.  OSA on CPAP -Doing great on CPAP. Has excellent compliance and benefit. Continue nightly use minimum 4 hours. Continue current settings. Continue to replace supplies routinely with DME. Follow up virtually in 1 year.   HISTORY OF PRESENT ILLNESS: Today 10/12/23 Here for CPAP visit.  Review of CPAP data shows 83% compliance greater than 4 hours.  6-16 cmH2O, leak 18, AHI 1.4. no issues with CPAP. Doing great. Sleeping well, feeling well rested during the day. Has a chin strap.   10/12/22 SS: Here for initial CPAP.  Evaluated initially by Dr. Chalice March 2024 with history of OSA on CPAP and symptomatic of frequent nighttime waking and daytime somnolence.  HST April 2024 showed moderate to severe OSA with clinically significant hypoxia. ESS 9. Using nasal pillow mask. She is using a chin strap, is going to get a new one, was hurting her ears, saw a new one on Facebook to try. Likes her new CPAP machine (was using old machine). Much more rested during the day, sleeping better, sleeps through the night. Setup date 07/26/22. Has been admitted since for AFIB.  Review of CPAP data 09/02/2022-10/01/2022 showed usage 28/30 days at 93%.  Greater than 4 hours 26 days at 87%.  Average usage days used 6 hours 58 minutes. 6-12 centimeters water.  EPR level 3.  Leak  15.5.  AHI 1.6.  Calculated pAHI (per hour):    28.2/h by AASM criteria, and 15.3/h by CMS criteria.    No central events were recorded.                      REM pAHI:     47.1/h                                            NREM pAHI:   23.7/h    HISTORY  05/25/22 Dr. Chalice : Holly Smith is a 78 y.o. female OSA and A fib patient who is seen upon referral on 05/25/2022 from [facility] for a new sleep evaluation.  Chief concern according to patient :  I need a new CPAP    I have the pleasure of seeing Kierria Toniya Rozar 05/25/22 a right-handed female with a  sleep disorder. She had a sleep study in 2015 at [facility] , and had a CPAP titration , too. She dislikes FFM. She developed atrial fib and failed 2 cardioversion, but she did not reach sustained sinus rhythm.  Finally she saw Dr Morgan, Cardiology. He referred for ablation, and she saw Dr Kelsie who stated it won't work. She continues to use CPAP, she is using a REM Star pro - at least 80 years old and on recall. Nuance pro  nasal pillows.  She needs a new machine :   Summary of her 2 sleep studies:  Sleep study #1 took place on 5 - 5 - 15 the interpreting physician was Dr. Sadie.  The patient slept 342 minutes with a sleep efficiency of 83% 16.8 minutes sleep latency REM latency 82 minutes.  Remarkably this patient had 34% REM sleep.  She reached an AHI of 14.5/h her apnea was worse when she slept on the left than when she slept supine which is remarkable.  Her REM AHI was 32/h and NREM AHI was 5.6/h.  So this is clearly REM sleep dependent sleep apnea and apparently obstructive hypopnea in origin as there were very few frank apnea seen.  Oxygen  saturation at nadir was 80% and total time in oxygen  desaturation under 90% was 12.6% of total sleep time.  Cardiac arrhythmia was detected.  She did not have PLM arousals.  The minimum heart rate was 69 and the maximum heart rate was 104 bpm.   This was followed by a CPAP trial on  08-02-13, sleep efficiency was now only 72% for CPAP titration.  Sleep latency was 46 minutes.  REM sleep latency was 124 minutes.  She still reached 35% REM sleep.  She had PLM's but did not wake up from them.  And she was titrated to 8 cm water pressure.  Her AHI was 0.5/h her REM AHI was 0.9/h her supine AHI was 1.8/h.  Again she had no frank apneas but lots of hypopneas.   The titration was successful.     CPAP compliance data :  She was then issued a [employer] REM star machine which is set at 8 cm water pressure with 2 cm expiratory relief and allows for an average AHI residual of 1.8/h.  So this is a very successful treatment and she has been highly compliant over the last 30 days she has used his antique CPAP machine 28 out of 30 days over 4 hours and 100% of days.  Very little air leak is noted she is using nasal pillows the so-called new walls Pro gel.  Her DME was [employer].   Sleep relevant medical history: Nocturia 2 r times, 3 and 6 AM,  Dream enactment , Tonsillectomy, at age [age]    Family medical /sleep history: one son on CPAP with OSA.   Social history:  Patient is  retired from [employer] and lives in a household with her son,  her daughter in law died of Covid and they moved together.  Family status is widowed , with 1 adult son , 1 granddaughter.   Pets are present. 2 dogs.  Tobacco use yes, until 2014.  ETOH use ; none,  Caffeine intake in form of Coffee( 1 cup in AM ) Soda( 2-3 a day) Tea ( /) or energy drinks Exercise in form of ; none.    Sleep habits are as follows: The patient's dinner time is between 6 PM. The patient goes to bed at 11 PM and continues to sleep for 7 hours, wakes for 2 bathroom breaks, the first time at 3 AM.   The preferred sleep position is on the left side, supine , with the support of 2 pillows.  Dreams are reportedly  frequent/vivid before she got on medication.   The patient wakes up spontaneously 6-7 . 9.30  AM is the usual rise time.  She reports not feeling refreshed or restored in AM, with symptoms such as dry mouth, morning headaches, and residual fatigue.  Naps are taken infrequently, in front of the TV- lasting from 15 to 30 minutes and are refreshing .  REVIEW OF SYSTEMS: Out of a complete 14 system review of symptoms, the patient complains only of the following symptoms, and all other reviewed systems are negative.  See HPI  ALLERGIES: Allergies  Allergen Reactions   Ciprofloxacin  Other (See Comments)    SOB   Macrobid  [Nitrofurantoin ] Shortness Of Breath    HOME MEDICATIONS: Outpatient Medications Prior to Visit  Medication Sig Dispense Refill   Cholecalciferol (VITAMIN D3) 1000 units CAPS Take by mouth.     DULoxetine  (CYMBALTA ) 20 MG capsule Take 1 capsule (20 mg total) by mouth daily. 90 capsule 0   FARXIGA  10 MG TABS tablet Take 1 tablet (10 mg total) by mouth daily. 90 tablet 3   fluticasone  (FLONASE ) 50 MCG/ACT nasal spray Place 2 sprays into both nostrils daily. 16 g 6   glipiZIDE  (GLUCOTROL ) 10 MG tablet Take 1 tablet (10 mg total) by mouth 2 (two) times daily before a meal. 180 tablet 3   irbesartan  (AVAPRO ) 75 MG tablet Take 37.5 mg by mouth daily.     metoprolol  tartrate (LOPRESSOR ) 100 MG tablet TAKE 1 & 1/2 TABS (150MG ) BY MOUTH 2 (TWO) TIMES DAILY. 270 tablet 3   nitroGLYCERIN  (NITROSTAT ) 0.4 MG SL tablet Place 1 tablet (0.4 mg total) under the tongue every 5 (five) minutes as needed. 25 tablet 2   pantoprazole  (PROTONIX ) 40 MG tablet Take 1 tablet (40 mg total) by mouth daily. 90 tablet 2   Semaglutide  (RYBELSUS ) 7 MG TABS Take 1 tablet (7 mg total) by mouth daily. 90 tablet 0   torsemide  (DEMADEX ) 20 MG tablet TAKE 2 TABLETS BY MOUTH EVERY DAY 180 tablet 3   traZODone  (DESYREL ) 100 MG tablet Take 1 tablet (100 mg total) by mouth at bedtime. 90 tablet 1   vitamin B-12 (CYANOCOBALAMIN ) 1000 MCG tablet Take 1,000 mcg by mouth every other day.      XARELTO  15 MG TABS tablet TAKE 1 TABLET (15 MG  TOTAL) BY MOUTH DAILY WITH SUPPER 90 tablet 3   No facility-administered medications prior to visit.    PAST MEDICAL HISTORY: Past Medical History:  Diagnosis Date   Abnormal Pap smear of vagina and vaginal HPV    Anticoagulation adequate 08/02/2014   Overview:  Xarelto  20hs; CHADS=1+ htn, female, age  Overview:  Xarelto  20hs; CHADS=1+ htn, female, age   Arthritis    bones ache (01/23/2015)   Atrial fibrillation, persistent (HCC) 08/13/2015   Overview:  symptomatic (fatigue, DOE, decreased exercise tolerance, palpitations), drug refractory (propafenone, amiodarone), complicated recovery from DCCV possibly related to repiratory obstruction [facility] (patient fear of DCCV)  Overview:  symptomatic (fatigue, DOE, decreased exercise tolerance, palpitations), drug refractory (propafenone, amiodarone), complicated recovery from DCCV possibly relat   Benign neoplasm of ascending colon    Benign neoplasm of descending colon    Benign neoplasm of sigmoid colon    Breath shortness 08/20/2013   CHF (congestive heart failure) (HCC)    Chronic atrial fibrillation (HCC)    Chronic combined systolic and diastolic CHF, NYHA class 3 (HCC) 01/28/2015   Essential hypertension 10/07/2014   Trial off acei 10/07/2014  Due to atypical sob (talking = walking)> no real change 11/18/2014     Family history of adverse reaction to anesthesia    sister gets very nauseated and vomits   GERD (gastroesophageal reflux disease)    History of kidney stones    HPV (human  papilloma virus) anogenital infection    03/21/12 PAP + HR HPV   Mitral regurgitation 04/23/2013   Overview:  Moderate to severe by TTE, mild to mod by TEE  Overview:  Moderate to severe by TTE, mild to mod by TEE   Obesity    OSA (obstructive sleep apnea) 11/27/2014   07/2013 RDI 17/h CPAP 8    Other specified diseases of esophagus    Postoperative nausea and vomiting 1983   many years ago with hysterectomy   PVC (premature ventricular  contraction) 12/16/2014   TI (tricuspid incompetence) 04/23/2013   Overview:  moderate by TTE    Urolithiasis    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 History of genital warts and vulvar condyloma    PAST SURGICAL HISTORY: Past Surgical History:  Procedure Laterality Date   ABDOMINAL HYSTERECTOMY     APPENDECTOMY     BILATERAL SALPINGOOPHORECTOMY  2003   benign ovarian cancer   CARDIOVERSION  2014-2015   [facility]   COLONOSCOPY WITH PROPOFOL  N/A 06/10/2015   Procedure: COLONOSCOPY WITH PROPOFOL ;  Surgeon: Rogelia Copping, MD;  Location: [facility] ENDOSCOPY;  Service: Endoscopy;  Laterality: N/A;   CORONARY STENT INTERVENTION N/A 01/18/2019   Procedure: CORONARY STENT INTERVENTION;  Surgeon: Dann Candyce RAMAN, MD;  Location: [facility] INVASIVE CV LAB;  Service: Cardiovascular;  Laterality: N/A;   CYSTOSCOPY W/ RETROGRADES Bilateral 10/19/2016   Procedure: CYSTOSCOPY WITH RETROGRADE PYELOGRAM;  Surgeon: Penne Knee, MD;  Location: [facility] ORS;  Service: Urology;  Laterality: Bilateral;   CYSTOSCOPY W/ URETERAL STENT PLACEMENT Left 12/29/2016   Procedure: CYSTOSCOPY WITH STENT REPLACEMENT;  Surgeon: Penne Knee, MD;  Location: [facility] ORS;  Service: Urology;  Laterality: Left;   CYSTOSCOPY WITH BIOPSY N/A 10/19/2016   Procedure: CYSTOSCOPY WITH BLADDER BIOPSY, VAGINAL WALL BIOPSY;  Surgeon: Penne Knee, MD;  Location: [facility] ORS;  Service: Urology;  Laterality: N/A;   CYSTOSCOPY WITH STENT PLACEMENT Left 12/14/2016   Procedure: CYSTOSCOPY WITH STENT PLACEMENT;  Surgeon: Penne Knee, MD;  Location: [facility] ORS;  Service: Urology;  Laterality: Left;   ESOPHAGOGASTRODUODENOSCOPY (EGD) WITH PROPOFOL  N/A 10/05/2016   Surgeon: Copping Rogelia, MD;  Location: [facility] ENDOSCOPY;  Results: Barrett's Esophagus- repeat in 3 years 09/2019   LEFT HEART CATH AND CORONARY ANGIOGRAPHY N/A 01/18/2019   Procedure: LEFT HEART CATH AND CORONARY ANGIOGRAPHY;  Surgeon: Dann Candyce RAMAN, MD;  Location: [facility] INVASIVE CV LAB;  Service: Cardiovascular;  Laterality: N/A;   LITHOTRIPSY     STONE EXTRACTION WITH BASKET Left 12/29/2016   Procedure: STONE EXTRACTION WITH BASKET;  Surgeon: Penne Knee, MD;  Location: [facility] ORS;  Service: Urology;  Laterality: Left;   TONSILLECTOMY     URETEROSCOPY Left 12/29/2016   Procedure: URETEROSCOPY;  Surgeon: Penne Knee, MD;  Location: [facility] ORS;  Service: Urology;  Laterality: Left;   VULVECTOMY Right 05/10/2014   Excisional biopsy of the superior right labial majora mass; [facility]   VULVECTOMY PARTIAL  07/02/2014   Re-excision and sentinel node dissection at [facility].     FAMILY HISTORY: Family History  Problem Relation Age of Onset   Prostate cancer Brother 5       still living and well   Heart attack Father    Stroke Mother    Diabetes Mother    Hypertension Mother    Heart attack Brother    Pancreatic cancer Brother    Heart disease Sister    Breast cancer Neg Hx    Kidney cancer Neg  Hx    Bladder Cancer Neg Hx     SOCIAL HISTORY: Social History   Socioeconomic History   Marital status: Divorced    Spouse name: Not on file   Number of children: 1   Years of education: GED   Highest education level: 12th grade  Occupational History   Occupation: Retired  Tobacco Use   Smoking status: Former    Current packs/day: 0.00    Average packs/day: 1 pack/day for 50.0 years (50.0 ttl pk-yrs)    Types: Cigarettes    Start date: 05/21/1962    Quit date: 05/20/2012    Years since quitting: 11.4   Smokeless tobacco: Never   Tobacco comments:    smoking cessation materials not required  Vaping Use   Vaping status: Never Used  Substance and Sexual Activity   Alcohol use: No    Alcohol/week: 0.0 standard drinks of alcohol   Drug use: No   Sexual activity: Not Currently  Other Topics Concern   Not on file  Social History Narrative   Not on file   Social Drivers of Health   Financial Resource  Strain: Low Risk  (08/31/2023)   Overall Financial Resource Strain (CARDIA)    Difficulty of Paying Living Expenses: Not hard at all  Food Insecurity: No Food Insecurity (08/31/2023)   Hunger Vital Sign    Worried About Running Out of Food in the Last Year: Never true    Ran Out of Food in the Last Year: Never true  Transportation Needs: No Transportation Needs (08/31/2023)   PRAPARE - Administrator, Civil Service (Medical): No    Lack of Transportation (Non-Medical): No  Physical Activity: Inactive (08/31/2023)   Exercise Vital Sign    Days of Exercise per Week: 0 days    Minutes of Exercise per Session: 20 min  Stress: No Stress Concern Present (08/31/2023)   [employer] of Occupational Health - Occupational Stress Questionnaire    Feeling of Stress : Not at all  Social Connections: Socially Isolated (08/31/2023)   Social Connection and Isolation Panel    Frequency of Communication with Friends and Family: More than three times a week    Frequency of Social Gatherings with Friends and Family: More than three times a week    Attends Religious Services: Never    Database administrator or Organizations: No    Attends Banker Meetings: Never    Marital Status: Widowed  Intimate Partner Violence: Not At Risk (08/31/2023)   Humiliation, Afraid, Rape, and Kick questionnaire    Fear of Current or Ex-Partner: No    Emotionally Abused: No    Physically Abused: No    Sexually Abused: No   PHYSICAL EXAM  There were no vitals filed for this visit.  There is no height or weight on file to calculate BMI.  Generalized: Well developed, in no acute distress  Alert and oriented. Speech is clear and concise. Seated in chair.    DIAGNOSTIC DATA (LABS, IMAGING, TESTING) - I reviewed patient records, labs, notes, testing and imaging myself where available.  Lab Results  Component Value Date   WBC 9.5 08/12/2023   HGB 15.8 (H) 08/12/2023   HCT 46.8 (H) 08/12/2023    MCV 94.0 08/12/2023   PLT 241 08/12/2023      Component Value Date/Time   NA 141 08/12/2023 1413   NA 138 03/26/2020 0000   NA 138 06/23/2013 0425   K 4.5 08/12/2023 1413  K 3.8 06/23/2013 0425   CL 101 08/12/2023 1413   CL 101 06/23/2013 0425   CO2 27 08/12/2023 1413   CO2 32 06/23/2013 0425   GLUCOSE 118 (H) 08/12/2023 1413   GLUCOSE 130 (H) 06/23/2013 0425   BUN 28 (H) 08/12/2023 1413   BUN 30 (H) 03/26/2020 0000   BUN 24 (H) 06/23/2013 0425   CREATININE 1.46 (H) 08/12/2023 1413   CALCIUM  10.1 08/12/2023 1413   CALCIUM  9.0 06/23/2013 0425   PROT 7.8 08/12/2023 1413   PROT 7.4 11/09/2017 1111   ALBUMIN 4.1 06/20/2023 1528   ALBUMIN 4.2 11/09/2017 1111   AST 19 08/12/2023 1413   ALT 22 08/12/2023 1413   ALKPHOS 68 06/20/2023 1528   BILITOT 0.5 08/12/2023 1413   BILITOT 0.5 11/09/2017 1111   GFRNONAA 44 (L) 08/20/2022 0023   GFRNONAA 53 (L) 06/23/2013 0425   GFRAA 33 (L) 03/26/2020 0000   GFRAA >60 06/23/2013 0425   Lab Results  Component Value Date   CHOL 177 06/20/2023   HDL 38.60 (L) 06/20/2023   LDLCALC 97 06/20/2023   TRIG 207.0 (H) 06/20/2023   CHOLHDL 5 06/20/2023   Lab Results  Component Value Date   HGBA1C 6.5 06/20/2023   Lab Results  Component Value Date   VITAMINB12 362 01/21/2015   Lab Results  Component Value Date   TSH 1.37 08/12/2023    Lauraine Born, AGNP-C, DNP 10/12/2023, 2:36 PM [facility] [address], [address] [place], [place] [address] [phone]

## 2023-10-12 ENCOUNTER — Telehealth: Payer: Medicare HMO | Admitting: Neurology

## 2023-10-12 DIAGNOSIS — G4733 Obstructive sleep apnea (adult) (pediatric): Secondary | ICD-10-CM | POA: Diagnosis not present

## 2023-10-12 NOTE — Patient Instructions (Signed)
 Great to see you today! Continue CPAP usage minimum 4 hours nightly Continue current settings Continue to replace supplies routinely through DME Follow-up in 1 year or sooner if needed. Thanks!!

## 2023-10-28 ENCOUNTER — Telehealth: Payer: Self-pay

## 2023-10-28 NOTE — Telephone Encounter (Signed)
 error

## 2023-11-25 ENCOUNTER — Other Ambulatory Visit: Payer: Self-pay | Admitting: Family Medicine

## 2023-11-29 ENCOUNTER — Other Ambulatory Visit: Payer: Self-pay | Admitting: Family Medicine

## 2023-11-29 ENCOUNTER — Encounter: Payer: Self-pay | Admitting: Family Medicine

## 2023-12-03 ENCOUNTER — Other Ambulatory Visit: Payer: Self-pay | Admitting: Family Medicine

## 2023-12-05 ENCOUNTER — Other Ambulatory Visit: Payer: Self-pay | Admitting: Internal Medicine

## 2023-12-05 DIAGNOSIS — I4821 Permanent atrial fibrillation: Secondary | ICD-10-CM

## 2023-12-07 MED ORDER — RIVAROXABAN 15 MG PO TABS: 15.0000 mg | ORAL_TABLET | Freq: Every day | ORAL | 1 refills | Status: AC

## 2023-12-07 NOTE — Telephone Encounter (Signed)
 Prescription refill request for Xarelto  received.  Indication: a fib Last office visit: 06/16/23 Weight: 188 Age: 78 Scr: 1.46 epic 08/12/23 CrCl: 41 ml/min

## 2023-12-08 NOTE — Progress Notes (Signed)
 Cardiology Office Note:   Date:  12/13/2023  ID:  Holly Smith, Arauz [birth year], MRN [id number] PCP:  Frann Mabel Mt, DO  [facility] Providers Cardiologist:  Wendel Haws, MD Referring MD: Frann Mabel Mt*  Chief Complaint/Reason for Referral: Follow-up for coronary artery disease ASSESSMENT:    1. Permanent atrial fibrillation (HCC)   2. Secondary hypercoagulable state   3. Chronic diastolic CHF (congestive heart failure) (HCC)   4. Coronary artery disease involving native coronary artery of native heart with other form of angina pectoris   5. Type 2 diabetes mellitus with complication, without long-term current use of insulin  (HCC)   6. Hypertension associated with diabetes (HCC)   7. Hyperlipidemia associated with type 2 diabetes mellitus (HCC)   8. Aortic atherosclerosis   9. CKD stage 3 due to type 2 diabetes mellitus (HCC)   10. BMI 38.0-38.9,adult      PLAN:   In order of problems listed above: Atrial fibrillation: Continue Xarelto  15mg  daily and Lopressor  150 mg twice daily. Secondary hypercoagulable state: Continue Xarelto  15 mg daily. Chronic diastolic heart failure: Continue Farxiga  10 mg daily, restart irbesartan  37.5 mg daily daily, continue Lopressor  150 mg twice daily.  Check echocardiogram. Coronary artery disease: Continue Xarelto  15 mg daily, start atorvastatin  40 mg as below  Type 2 diabetes mellitus: Continue Xarelto  50 mg daily, restart irbesartan  37.5 mg daily 75 mg daily, continue semaglutide  7 mg daily, and continue Farxiga  10 mg daily. Hypertension: Restart irbesartan  37.5 mg daily, continue Lopressor  150 mg twice daily.  BP above goal of 130/80. Hyperlipidemia: Discussed with patient.  She is willing to trial a statin.  Will start atorvastatin  40 mg and check lipid panel, LFTs, LP(a) in 2 months.  If patient has problems with atorvastatin  we will try rosuvastatin . Aortic atherosclerosis: Continue Xarelto  50 mg daily, start  atorvastatin  40 mg daily, and strict blood pressure control. CKD stage III: Continue Farxiga  10 mg daily and restart irbesartan  37.5 mg daily  Elevated BMI: Continue semaglutide  7 mg daily.            Dispo:  Return in about 6 months (around 06/11/2024).      Medication Adjustments/Labs and Tests Ordered: Current medicines are reviewed at length with the patient today.  Concerns regarding medicines are outlined above.  The following changes have been made:     Labs/tests ordered: Orders Placed This Encounter  Procedures   EKG 12-Lead    Medication Changes: No orders of the defined types were placed in this encounter.   Current medicines are reviewed at length with the patient today.  The patient does not have concerns regarding medicines.  I spent 34 minutes reviewing all clinical data during and prior to this visit including all relevant imaging studies, laboratories, clinical information from other health systems and prior notes from both Cardiology and other specialties, interviewing the patient, conducting a complete physical examination, and coordinating care in order to formulate a comprehensive and personalized evaluation and treatment plan.   History of Present Illness:      FOCUSED PROBLEM LIST:   Permanent AF CV 2 score 6 On Xarelto  Failed propafenone and amiodarone >> not a candidate for PVI Chronic diastolic heart failure LAE, no significant valve problems, EF 55 to 60% TTE 2016 CAD Angina >> PCI of mid to distal LAD DES x 1 2020 Type 2 diabetes mellitus Not on insulin  Hyperlipidemia Defers statin Aortic atherosclerosis Chest CT 2024 CKD stage IIIb BMI 38 OSA on CPAP  March  2025:  Patient consents to use of AI scribe. The patient returns for routine follow-up.  She was seen in September of last year and was doing well at that time.  Her blood pressure was under good control at that time.  She experiences occasional breathing difficulties, which she  associates with dietary intake, particularly sodium. Her granddaughter is involved in monitoring her diet.  She reports symptoms of neuropathy, describing sensations like 'pins and needles' and 'sand in my shoes'. She has not found the symptoms to be painful but rather aggravating.  She is on two fluid pills daily to manage swelling, which increases if she reduces the dose. She experiences frequent urination as a result of the medication.  Plan: Restart irbesartan  37.5 mg and obtain echocardiogram.  September 2025:  Patient consents to use of AI scribe. The patient returns for routine follow-up.  An echocardiogram was never performed.  She stopped taking irbesartan  about a year ago after being advised during a hospital visit, although she is unsure which doctor provided this advice. She has the medication at home but has not been taking it. Currently, she is taking metoprolol  for blood pressure management.  She is hesitant to take cholesterol-lowering medications due to concerns about leg pain, which she experiences even without the medication. She has not tried any cholesterol medications yet. Her cholesterol level was noted to be 97 in March.  She denies any shortness of breath, chest pain, presyncope, syncope, palpitations, paroxysmal atrial dyspnea, orthopnea.  She is otherwise doing fairly well.           Current Medications: Current Meds  Medication Sig   Cholecalciferol (VITAMIN D3) 1000 units CAPS Take by mouth.   DULoxetine  (CYMBALTA ) 20 MG capsule Take 1 capsule (20 mg total) by mouth daily.   FARXIGA  10 MG TABS tablet TAKE 1 TABLET BY MOUTH EVERY DAY   fluticasone  (FLONASE ) 50 MCG/ACT nasal spray Place 2 sprays into both nostrils daily.   glipiZIDE  (GLUCOTROL ) 10 MG tablet TAKE 1 TABLET (10 MG TOTAL) BY MOUTH TWICE A DAY BEFORE A MEAL   metoprolol  tartrate (LOPRESSOR ) 100 MG tablet TAKE 1 & 1/2 TABS (150MG ) BY MOUTH 2 (TWO) TIMES DAILY.   nitroGLYCERIN  (NITROSTAT ) 0.4 MG SL  tablet Place 1 tablet (0.4 mg total) under the tongue every 5 (five) minutes as needed.   pantoprazole  (PROTONIX ) 40 MG tablet Take 1 tablet (40 mg total) by mouth daily.   Rivaroxaban  (XARELTO ) 15 MG TABS tablet Take 1 tablet (15 mg total) by mouth daily with supper.   Semaglutide  (RYBELSUS ) 7 MG TABS Take 1 tablet (7 mg total) by mouth daily.   torsemide  (DEMADEX ) 20 MG tablet TAKE 2 TABLETS BY MOUTH EVERY DAY   traZODone  (DESYREL ) 100 MG tablet Take 1 tablet (100 mg total) by mouth at bedtime.   vitamin B-12 (CYANOCOBALAMIN ) 1000 MCG tablet Take 1,000 mcg by mouth every other day.      Review of Systems:   Please see the history of present illness.    All other systems reviewed and are negative.     EKGs/Labs/Other Test Reviewed:   EKG: EKG from May 2024 demonstrates atrial fibrillation with rapid ventricular rate  EKG Interpretation Date/Time:  Tuesday December 13 2023 10:01:24 EDT Ventricular Rate:  94 PR Interval:    QRS Duration:  96 QT Interval:  374 QTC Calculation: 467 R Axis:   17  Text Interpretation: Atrial fibrillation with premature ventricular or aberrantly conducted complexes Incomplete right bundle branch block Nonspecific  ST and T wave abnormality When compared with ECG of 18-Aug-2022 21:29, PREVIOUS ECG IS PRESENT Confirmed by Wendel Haws ([id number]) on 12/13/2023 10:21:04 AM         Risk Assessment/Calculations:    CHA2DS2-VASc Score = 6   This indicates a 9.7% annual risk of stroke. The patient's score is based upon: CHF History: 1 HTN History: 1 Diabetes History: 1 Stroke History: 0 Vascular Disease History: 0 Age Score: 2 Gender Score: 1         Physical Exam:   VS:  BP 126/85   Pulse 82   Ht 5' 1 (1.549 m)   Wt 189 lb 3.2 oz (85.8 kg)   SpO2 95%   BMI 35.75 kg/m        Wt Readings from Last 3 Encounters:  12/13/23 189 lb 3.2 oz (85.8 kg)  09/12/23 188 lb (85.3 kg)  08/31/23 188 lb (85.3 kg)      GENERAL:  No apparent distress,  AOx3 HEENT:  No carotid bruits, +2 carotid impulses, no scleral icterus CAR: Irregular rate and rhythm no murmurs, gallops, rubs, or thrills RES:  Clear to auscultation bilaterally ABD:  Soft, nontender, nondistended, positive bowel sounds x 4 VASC:  +2 radial pulses, +2 carotid pulses NEURO:  CN 2-12 grossly intact; motor and sensory grossly intact PSYCH:  No active depression or anxiety EXT:  No edema, ecchymosis, or cyanosis  Signed, Primo Innis K Jim Philemon, MD  12/13/2023 10:36 AM    [facility] [address] [place], [place], [place]  [address] Phone: [phone]; Fax: [phone]   Note:  This document was prepared using Dragon voice recognition software and may include unintentional dictation errors.

## 2023-12-13 ENCOUNTER — Encounter: Payer: Self-pay | Admitting: Internal Medicine

## 2023-12-13 ENCOUNTER — Ambulatory Visit: Attending: Internal Medicine | Admitting: Internal Medicine

## 2023-12-13 VITALS — BP 126/85 | HR 82 | Ht 61.0 in | Wt 189.2 lb

## 2023-12-13 DIAGNOSIS — Z6838 Body mass index (BMI) 38.0-38.9, adult: Secondary | ICD-10-CM

## 2023-12-13 DIAGNOSIS — I700 Atherosclerosis of aorta: Secondary | ICD-10-CM

## 2023-12-13 DIAGNOSIS — I4821 Permanent atrial fibrillation: Secondary | ICD-10-CM | POA: Diagnosis not present

## 2023-12-13 DIAGNOSIS — I152 Hypertension secondary to endocrine disorders: Secondary | ICD-10-CM

## 2023-12-13 DIAGNOSIS — E1159 Type 2 diabetes mellitus with other circulatory complications: Secondary | ICD-10-CM | POA: Diagnosis not present

## 2023-12-13 DIAGNOSIS — I5032 Chronic diastolic (congestive) heart failure: Secondary | ICD-10-CM

## 2023-12-13 DIAGNOSIS — E1122 Type 2 diabetes mellitus with diabetic chronic kidney disease: Secondary | ICD-10-CM | POA: Diagnosis not present

## 2023-12-13 DIAGNOSIS — I25118 Atherosclerotic heart disease of native coronary artery with other forms of angina pectoris: Secondary | ICD-10-CM

## 2023-12-13 DIAGNOSIS — E785 Hyperlipidemia, unspecified: Secondary | ICD-10-CM

## 2023-12-13 DIAGNOSIS — E1169 Type 2 diabetes mellitus with other specified complication: Secondary | ICD-10-CM | POA: Diagnosis not present

## 2023-12-13 DIAGNOSIS — N183 Chronic kidney disease, stage 3 unspecified: Secondary | ICD-10-CM

## 2023-12-13 DIAGNOSIS — D6869 Other thrombophilia: Secondary | ICD-10-CM

## 2023-12-13 DIAGNOSIS — E118 Type 2 diabetes mellitus with unspecified complications: Secondary | ICD-10-CM

## 2023-12-13 MED ORDER — ATORVASTATIN CALCIUM 40 MG PO TABS: 40.0000 mg | ORAL_TABLET | Freq: Every day | ORAL | 3 refills | Status: DC

## 2023-12-13 NOTE — Patient Instructions (Addendum)
 Medication Instructions:  START Atorvastatin  (Lipitor) 40 mg once daily   RESTART Irbesartan    *If you need a refill on your cardiac medications before your next appointment, please call your pharmacy*  Lab Work: To be completed in 2 months: lipid panel, LFT, and lipoprotein-a  If you have labs (blood work) drawn today and your tests are completely normal, you will receive your results only by: MyChart Message (if you have MyChart) OR A paper copy in the mail If you have any lab test that is abnormal or we need to change your treatment, we will call you to review the results.  Testing/Procedures: Your physician has requested that you have an echocardiogram. Echocardiography is a painless test that uses sound waves to create images of your heart. It provides your doctor with information about the size and shape of your heart and how well your heart's chambers and valves are working. This procedure takes approximately one hour. There are no restrictions for this procedure. Please do NOT wear cologne, perfume, aftershave, or lotions (deodorant is allowed). Please arrive 15 minutes prior to your appointment time.  Please note: We ask at that you not bring children with you during ultrasound (echo/ vascular) testing. Due to room size and safety concerns, children are not allowed in the ultrasound rooms during exams. Our front office staff cannot provide observation of children in our lobby area while testing is being conducted. An adult accompanying a patient to their appointment will only be allowed in the ultrasound room at the discretion of the ultrasound technician under special circumstances. We apologize for any inconvenience.   Follow-Up: At [facility], you and your health needs are our priority.  As part of our continuing mission to provide you with exceptional heart care, our providers are all part of one team.  This team includes your primary Cardiologist (physician) and  Advanced Practice Providers or APPs (Physician Assistants and Nurse Practitioners) who all work together to provide you with the care you need, when you need it.  Your next appointment:   6 month(s)  Provider:   Arun Thukkani, MD

## 2023-12-30 ENCOUNTER — Encounter: Payer: Self-pay | Admitting: Family Medicine

## 2024-01-08 ENCOUNTER — Other Ambulatory Visit: Payer: Self-pay | Admitting: Family Medicine

## 2024-01-08 DIAGNOSIS — E1142 Type 2 diabetes mellitus with diabetic polyneuropathy: Secondary | ICD-10-CM

## 2024-01-09 ENCOUNTER — Other Ambulatory Visit: Payer: Self-pay | Admitting: Internal Medicine

## 2024-01-12 MED ORDER — IRBESARTAN 75 MG PO TABS: 37.5000 mg | ORAL_TABLET | Freq: Every day | ORAL | 3 refills | Status: AC

## 2024-01-17 ENCOUNTER — Ambulatory Visit (HOSPITAL_COMMUNITY)
Admission: RE | Admit: 2024-01-17 | Discharge: 2024-01-17 | Disposition: A | Discharge status: Home / Self Care | Source: Ambulatory Visit | Attending: Cardiology | Admitting: Cardiology

## 2024-01-17 DIAGNOSIS — I5032 Chronic diastolic (congestive) heart failure: Secondary | ICD-10-CM | POA: Insufficient documentation

## 2024-01-18 ENCOUNTER — Ambulatory Visit: Payer: Self-pay | Admitting: Internal Medicine

## 2024-01-18 LAB — ECHOCARDIOGRAM COMPLETE
Area-P 1/2: 5 cm2
S' Lateral: 2.9 cm

## 2024-02-13 ENCOUNTER — Other Ambulatory Visit: Payer: Self-pay | Admitting: Family Medicine

## 2024-03-02 ENCOUNTER — Other Ambulatory Visit: Payer: Self-pay | Admitting: Family Medicine

## 2024-03-05 ENCOUNTER — Telehealth: Payer: Self-pay

## 2024-03-05 ENCOUNTER — Other Ambulatory Visit (HOSPITAL_COMMUNITY): Payer: Self-pay

## 2024-03-05 NOTE — Telephone Encounter (Signed)
 Copied from CRM [id number]. Topic: Clinical - Prescription Issue >> Mar 05, 2024  3:10 PM Olam RAMAN wrote: Reason for CRM: Pt is calling about her ins. She is taking Rybelus and her ins is saying the provider needs to send in a formulary authorization. Send in that pt needs to take the certain medication, Semaglutide  (RYBELSUS ) 7 MG TABS. PT stated this is a heart policy and they wont pay for certain med/medication for diabetes Cb  [phone] Insurance number: [phone] PT STATED TO CALL THIS NUMBER PA: [phone] option 3 Pharmacy claim: [id number] PT STATED TO CALL THIS NUMBER

## 2024-03-05 NOTE — Telephone Encounter (Signed)
 Pharmacy Patient Advocate Encounter   Received notification from Physician's Office that prior authorization for Rybelsus  7 mg tablets is required/requested.   Insurance verification completed.   The patient is insured through [facility] ADVANTAGE/RX ADVANCE.   Per test claim: PA required; PA submitted to above mentioned insurance via Latent Key/confirmation #/EOC [id number] Status is pending

## 2024-03-05 NOTE — Telephone Encounter (Signed)
Needs PA please.

## 2024-03-06 ENCOUNTER — Other Ambulatory Visit (HOSPITAL_COMMUNITY): Payer: Self-pay

## 2024-03-06 NOTE — Telephone Encounter (Signed)
 Pharmacy Patient Advocate Encounter  Received notification from [facility] ADVANTAGE/RX ADVANCE that Prior Authorization for Rybelsus  7mg  tabs has been APPROVED from 03/05/24 to 03/05/25   PA #/Case ID/Reference #: [id number]

## 2024-03-06 NOTE — Telephone Encounter (Signed)
 PA pending

## 2024-03-07 ENCOUNTER — Other Ambulatory Visit (HOSPITAL_COMMUNITY): Payer: Self-pay

## 2024-03-07 NOTE — Telephone Encounter (Signed)
See approval below 

## 2024-03-07 NOTE — Telephone Encounter (Signed)
 Sent pt message letting her know  Rybelsus  7mg  tabs has been APPROVED

## 2024-03-13 ENCOUNTER — Other Ambulatory Visit: Payer: Self-pay | Admitting: Family Medicine

## 2024-03-13 ENCOUNTER — Other Ambulatory Visit: Payer: Self-pay | Admitting: Internal Medicine

## 2024-03-26 ENCOUNTER — Encounter (HOSPITAL_COMMUNITY): Payer: Self-pay

## 2024-03-26 ENCOUNTER — Emergency Department (HOSPITAL_COMMUNITY)
Admission: EM | Admit: 2024-03-26 | Discharge: 2024-03-27 | Discharge status: Against Medical Advice | Attending: Emergency Medicine | Admitting: Emergency Medicine

## 2024-03-26 DIAGNOSIS — E162 Hypoglycemia, unspecified: Secondary | ICD-10-CM | POA: Insufficient documentation

## 2024-03-26 DIAGNOSIS — R4701 Aphasia: Secondary | ICD-10-CM | POA: Insufficient documentation

## 2024-03-26 DIAGNOSIS — R4781 Slurred speech: Secondary | ICD-10-CM | POA: Diagnosis present

## 2024-03-26 DIAGNOSIS — Z5321 Procedure and treatment not carried out due to patient leaving prior to being seen by health care provider: Secondary | ICD-10-CM | POA: Insufficient documentation

## 2024-03-26 NOTE — ED Triage Notes (Signed)
 Pt BIB [facility] from home d/t having slurred speech and aphasic onset 1730.  Pt had CBG of 52.  Pt was A& O x3 and ate [employer] .  Now s/s have resolved and CBG is 110 with [facility].    BP 110/80 HR 92 O2 95% on RA RR 18

## 2024-03-27 ENCOUNTER — Telehealth (INDEPENDENT_AMBULATORY_CARE_PROVIDER_SITE_OTHER): Admitting: Family Medicine

## 2024-03-27 ENCOUNTER — Encounter: Payer: Self-pay | Admitting: Family Medicine

## 2024-03-27 ENCOUNTER — Emergency Department (HOSPITAL_COMMUNITY)

## 2024-03-27 DIAGNOSIS — N3000 Acute cystitis without hematuria: Secondary | ICD-10-CM | POA: Diagnosis not present

## 2024-03-27 LAB — URINALYSIS, ROUTINE W REFLEX MICROSCOPIC
Bilirubin Urine: NEGATIVE
Glucose, UA: NEGATIVE mg/dL
Hgb urine dipstick: NEGATIVE
Ketones, ur: NEGATIVE mg/dL
Nitrite: NEGATIVE
Protein, ur: 30 mg/dL — AB
Specific Gravity, Urine: 1.014 (ref 1.005–1.030)
WBC, UA: >50 WBC/hpf (ref 0–5)
pH: 5 (ref 5.0–8.0)

## 2024-03-27 LAB — CBC WITH DIFFERENTIAL/PLATELET
Abs Immature Granulocytes: 0.03 K/uL (ref 0.00–0.07)
Basophils Absolute: 0 K/uL (ref 0.0–0.1)
Basophils Relative: 0 %
Eosinophils Absolute: 0.1 K/uL (ref 0.0–0.5)
Eosinophils Relative: 1 %
HCT: 51 % — ABNORMAL HIGH (ref 36.0–46.0)
Hemoglobin: 16.5 g/dL — ABNORMAL HIGH (ref 12.0–15.0)
Immature Granulocytes: 0 %
Lymphocytes Relative: 26 %
Lymphs Abs: 2.4 K/uL (ref 0.7–4.0)
MCH: 31.1 pg (ref 26.0–34.0)
MCHC: 32.4 g/dL (ref 30.0–36.0)
MCV: 96 fL (ref 80.0–100.0)
Monocytes Absolute: 1.1 K/uL — ABNORMAL HIGH (ref 0.1–1.0)
Monocytes Relative: 12 %
Neutro Abs: 5.8 K/uL (ref 1.7–7.7)
Neutrophils Relative %: 61 %
Platelets: 266 K/uL (ref 150–400)
RBC: 5.31 MIL/uL — ABNORMAL HIGH (ref 3.87–5.11)
RDW: 14 % (ref 11.5–15.5)
WBC: 9.4 K/uL (ref 4.0–10.5)
nRBC: 0 % (ref 0.0–0.2)

## 2024-03-27 LAB — TROPONIN T, HIGH SENSITIVITY
Troponin T High Sensitivity: 20 ng/L — ABNORMAL HIGH (ref 0–19)
Troponin T High Sensitivity: 20 ng/L — ABNORMAL HIGH (ref 0–19)

## 2024-03-27 LAB — COMPREHENSIVE METABOLIC PANEL WITH GFR
ALT: 35 U/L (ref 0–44)
AST: 39 U/L (ref 15–41)
Albumin: 4.1 g/dL (ref 3.5–5.0)
Alkaline Phosphatase: 77 U/L (ref 38–126)
Anion gap: 15 (ref 5–15)
BUN: 25 mg/dL — ABNORMAL HIGH (ref 8–23)
CO2: 23 mmol/L (ref 22–32)
Calcium: 9.7 mg/dL (ref 8.9–10.3)
Chloride: 100 mmol/L (ref 98–111)
Creatinine, Ser: 1.34 mg/dL — ABNORMAL HIGH (ref 0.44–1.00)
GFR, Estimated: 40 mL/min — ABNORMAL LOW
Glucose, Bld: 108 mg/dL — ABNORMAL HIGH (ref 70–99)
Potassium: 3.6 mmol/L (ref 3.5–5.1)
Sodium: 138 mmol/L (ref 135–145)
Total Bilirubin: 0.4 mg/dL (ref 0.0–1.2)
Total Protein: 8.1 g/dL (ref 6.5–8.1)

## 2024-03-27 LAB — CBG MONITORING, ED
Glucose-Capillary: 128 mg/dL — ABNORMAL HIGH (ref 70–99)
Glucose-Capillary: 137 mg/dL — ABNORMAL HIGH (ref 70–99)

## 2024-03-27 LAB — LIPASE, BLOOD: Lipase: 75 U/L — ABNORMAL HIGH (ref 11–51)

## 2024-03-27 MED ORDER — CEFDINIR 300 MG PO CAPS: 300.0000 mg | ORAL_CAPSULE | Freq: Two times a day (BID) | ORAL | 0 refills | Status: AC

## 2024-03-27 MED ORDER — FLUCONAZOLE 150 MG PO TABS: ORAL_TABLET | ORAL | 0 refills | Status: AC

## 2024-03-27 NOTE — ED Provider Triage Note (Signed)
 Emergency Medicine Provider Triage Evaluation Note  Holly Smith , a 79 y.o. female  was evaluated in triage.  Pt complains of slurred speech, aphasia that started at 1730 today. CBG noted to be 55 at that time and has since improved to 137.  NVD for past 3-4 days. no known sick contacts, recent travel, nor suspicious foods. 2-3 days of dysuria    Review of Systems  Positive: See hpi Negative:   Physical Exam  BP 104/69 (BP Location: Right Arm)   Pulse 88   Temp (!) 97.3 F (36.3 C) (Oral)   Resp 20   SpO2 94%  Gen:   Awake, no distress   Resp:  Normal effort  MSK:   Moves extremities without difficulty  Other:  Generalized abd tenderness worsened in LUQ, RLQ   A&Ox3. Motor 5/5 and sensation 2/2 of BUE and BLE. No pronator drift. No slurred speech. No paresthesia nor numbness to face. No facial droop. No agnosia nor aphasia  Medical Decision Making  Medically screening exam initiated at 12:08 AM.  Appropriate orders placed.  Holly Smith was informed that the remainder of the evaluation will be completed by another provider, this initial triage assessment does not replace that evaluation, and the importance of remaining in the ED until their evaluation is complete.  EKG a fib at 94bpm - does have a hx of afib. Endorses compliance with meds. No complaints of CP, SHOB  Neuro exam reassuring with no focal deficits. No visual nor speech disturbances - did order ct head   Minnie Tinnie BRAVO, PA 03/27/24 [phone]
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
## 2024-03-27 NOTE — Progress Notes (Signed)
 Chief Complaint  Patient presents with   Urinary Tract Infection    UTI     Holly Smith is a 79 y.o. female here for possible UTI. We are interacting via web portal for an electronic face-to-face visit. I verified patient's ID using 2 identifiers. Patient agreed to proceed with visit via this method. Patient is at home, I am at office. Patient, her granddaughter, and I are present for visit.   Duration: 2 weeks. Symptoms: Dysuria, diarrhea, urgency, bleeding weakness,urinary frequency, nausea, vomiting, confusion, and urgency Denies: urinary hesitancy, urinary retention, fever, flank pain, vaginal discharge Hx of recurrent UTI? No Denies new sexual partners.  Past Medical History:  Diagnosis Date   Abnormal Pap smear of vagina and vaginal HPV    Anticoagulation adequate 08/02/2014   Overview:  Xarelto  20hs; CHADS=1+ htn, female, age  Overview:  Xarelto  20hs; CHADS=1+ htn, female, age   Arthritis    bones ache (01/23/2015)   Atrial fibrillation, persistent (HCC) 08/13/2015   Overview:  symptomatic (fatigue, DOE, decreased exercise tolerance, palpitations), drug refractory (propafenone, amiodarone), complicated recovery from DCCV possibly related to repiratory obstruction [facility] (patient fear of DCCV)  Overview:  symptomatic (fatigue, DOE, decreased exercise tolerance, palpitations), drug refractory (propafenone, amiodarone), complicated recovery from DCCV possibly relat   Benign neoplasm of ascending colon    Benign neoplasm of descending colon    Benign neoplasm of sigmoid colon    Breath shortness 08/20/2013   CHF (congestive heart failure) (HCC)    Chronic atrial fibrillation (HCC)    Chronic combined systolic and diastolic CHF, NYHA class 3 (HCC) 01/28/2015   Essential hypertension 10/07/2014   Trial off acei 10/07/2014  Due to atypical sob (talking = walking)> no real change 11/18/2014     Family history of adverse reaction to anesthesia    sister gets very nauseated and  vomits   GERD (gastroesophageal reflux disease)    History of kidney stones    HPV (human papilloma virus) anogenital infection    03/21/12 PAP + HR HPV   Mitral regurgitation 04/23/2013   Overview:  Moderate to severe by TTE, mild to mod by TEE  Overview:  Moderate to severe by TTE, mild to mod by TEE   Obesity    OSA (obstructive sleep apnea) 11/27/2014   07/2013 RDI 17/h CPAP 8    Other specified diseases of esophagus    Postoperative nausea and vomiting 1983   many years ago with hysterectomy   PVC (premature ventricular contraction) 12/16/2014   TI (tricuspid incompetence) 04/23/2013   Overview:  moderate by TTE    Urolithiasis    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 Diflucan  prn.  Stay hydrated. Seek immediate care if pt starts to develop fevers, new/worsening symptoms, uncontrollable N/V. F/u in 1 mo for DM visit. The patient and her granddaughter voiced understanding and agreement to the plan.  Mabel Mt [place], DO 03/27/2024 1:50 PM

## 2024-04-02 ENCOUNTER — Encounter: Payer: Self-pay | Admitting: Family Medicine

## 2024-04-02 ENCOUNTER — Other Ambulatory Visit: Payer: Self-pay

## 2024-04-02 DIAGNOSIS — N7689 Other specified inflammation of vagina and vulva: Secondary | ICD-10-CM

## 2024-04-02 MED ORDER — METOPROLOL TARTRATE 100 MG PO TABS: ORAL_TABLET | ORAL | 3 refills | Status: AC

## 2024-04-05 ENCOUNTER — Ambulatory Visit: Payer: Self-pay

## 2024-04-05 NOTE — Telephone Encounter (Signed)
 FYI Only or Action Required?: FYI only for provider: appointment scheduled on 04/09/24.  Patient was last seen in primary care on 03/27/2024 by Frann Mabel Mt, DO.  Called Nurse Triage reporting Urinary Tract Infection.  Symptoms began yesterday.  Interventions attempted: Prescription medications: cefdinir ; fluconazole .  Symptoms are: gradually worsening.  Triage Disposition: See Physician Within 24 Hours  Patient/caregiver understands and will follow disposition?: Yes     Copied from CRM [id number]. Topic: Clinical - Red Word Triage >> Apr 05, 2024  3:22 PM Terri MATSU wrote: Red Word that prompted transfer to Nurse Triage: Speaking to Morna (granddaughter) calling regarding grandma. She stated it hurts when she sits, burning when urinating and there's a bump on her vagina for a month now.      Reason for Disposition  [1] Taking antibiotic > 72 hours (3 days) for UTI AND [2] painful urination or frequency is SAME (unchanged, not better)  Answer Assessment - Initial Assessment Questions Pt's granddaughter contacted clinic to report pt has completed cefdinir ; yesterday and symptoms have returned. Pt is experiencing burning with urination. Discussed pt would only see PCP, not willing to go to UC. Questioned if PCP could send refill of cefdinir  vs appt. Discussed importance of eval especially with symptoms returning, appt scheduled 04/09/24 and pt placed on wait list in event of cancellation 04/06/24. Available via, [phone]     1. MAIN SYMPTOM: What is the main symptom you are concerned about? (e.g., painful urination, urine frequency)     Burning with urination; bump on vagina. Pt has had tele visit with provider 03/27/24. States that symptoms did go away while on cefdinir  but after completing rx  yesterday, symptoms returned   2. BETTER-SAME-WORSE: Are you getting better, staying the same, or getting worse compared to how you felt at your last visit to the doctor  (most recent medical visit)?     Staying the same  3. PAIN: How bad is the pain?  (e.g., Scale 1-10; mild, moderate, or severe)     No pain; burning with urination   4. FEVER: Do you have a fever? If Yes, ask: What is it, how was it measured, and when did it start?     No   5. OTHER SYMPTOMS: Do you have any other symptoms? (e.g., blood in the urine, flank pain, vaginal discharge)     No   6. DIAGNOSIS: When was the UTI diagnosed? By whom? Was it a kidney infection, bladder infection or both?     03/27/24; pt also hospitalized d/t confusion and pt's granddaughter worried about stroke like symptoms   7. ANTIBIOTIC: What antibiotic(s) are you taking? How many times per day?     Cefdinir ; completed yesterday   8. ANTIBIOTIC - START DATE: When did you start taking the antibiotic?     03/27/24  Protocols used: Urinary Tract Infection on Antibiotic Follow-up Call - [facility]

## 2024-04-05 NOTE — Telephone Encounter (Signed)
 Appt scheduled

## 2024-04-09 ENCOUNTER — Ambulatory Visit (INDEPENDENT_AMBULATORY_CARE_PROVIDER_SITE_OTHER): Admitting: Family Medicine

## 2024-04-09 ENCOUNTER — Encounter: Payer: Self-pay | Admitting: Family Medicine

## 2024-04-09 VITALS — BP 118/70 | HR 74 | Temp 98.0°F | Resp 16 | Ht 61.0 in | Wt 190.4 lb

## 2024-04-09 DIAGNOSIS — L989 Disorder of the skin and subcutaneous tissue, unspecified: Secondary | ICD-10-CM | POA: Diagnosis not present

## 2024-04-09 DIAGNOSIS — Z8544 Personal history of malignant neoplasm of other female genital organs: Secondary | ICD-10-CM | POA: Diagnosis not present

## 2024-04-09 LAB — POC URINALSYSI DIPSTICK (AUTOMATED)
Bilirubin, UA: NEGATIVE
Blood, UA: NEGATIVE
Glucose, UA: NEGATIVE
Ketones, UA: NEGATIVE
Leukocytes, UA: NEGATIVE
Nitrite, UA: NEGATIVE
Protein, UA: NEGATIVE
Spec Grav, UA: <=1.005 — AB
Urobilinogen, UA: 0.2 U/dL
pH, UA: 5

## 2024-04-09 NOTE — Patient Instructions (Signed)
If you do not hear anything about your referral in the next 1-2 days, call our office and ask for an update.  Let us know if you need anything.

## 2024-04-09 NOTE — Progress Notes (Signed)
 Chief Complaint  Patient presents with   Skin lesion        Holly Smith is a 79 y.o. female here for a skin complaint.  Duration: a little over a mo Location: genital region Pruritic? No Painful? Yes Drainage? No New soaps/lotions/topicals/detergents? No Trauma? No Other associated symptoms: some swelling; no fevers or spreading redness, Urine  will get on it and cause burning Therapies tried thus far: none  Past Medical History:  Diagnosis Date   Abnormal Pap smear of vagina and vaginal HPV    Anticoagulation adequate 08/02/2014   Overview:  Xarelto  20hs; CHADS=1+ htn, female, age  Overview:  Xarelto  20hs; CHADS=1+ htn, female, age   Arthritis    bones ache (01/23/2015)   Atrial fibrillation, persistent (HCC) 08/13/2015   Overview:  symptomatic (fatigue, DOE, decreased exercise tolerance, palpitations), drug refractory (propafenone, amiodarone), complicated recovery from DCCV possibly related to repiratory obstruction [facility] (patient fear of DCCV)  Overview:  symptomatic (fatigue, DOE, decreased exercise tolerance, palpitations), drug refractory (propafenone, amiodarone), complicated recovery from DCCV possibly relat   Benign neoplasm of ascending colon    Benign neoplasm of descending colon    Benign neoplasm of sigmoid colon    Breath shortness 08/20/2013   CHF (congestive heart failure) (HCC)    Chronic atrial fibrillation (HCC)    Chronic combined systolic and diastolic CHF, NYHA class 3 (HCC) 01/28/2015   Essential hypertension 10/07/2014   Trial off acei 10/07/2014  Due to atypical sob (talking = walking)> no real change 11/18/2014     Family history of adverse reaction to anesthesia    sister gets very nauseated and vomits   GERD (gastroesophageal reflux disease)    History of kidney stones    HPV (human papilloma virus) anogenital infection    03/21/12 PAP + HR HPV   Mitral regurgitation 04/23/2013   Overview:  Moderate to severe by TTE, mild to mod by  TEE  Overview:  Moderate to severe by TTE, mild to mod by TEE   Obesity    OSA (obstructive sleep apnea) 11/27/2014   07/2013 RDI 17/h CPAP 8    Other specified diseases of esophagus    Postoperative nausea and vomiting 1983   many years ago with hysterectomy   PVC (premature ventricular contraction) 12/16/2014   TI (tricuspid incompetence) 04/23/2013   Overview:  moderate by TTE    Urolithiasis    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 Over the R vulvar region, there is a condylomatous lesion measuring approx 1 x 2.5 cm in diameter. Erythematous ring around area.  Psych: Age appropriate judgment and insight  Skin lesion - Plan: POCT Urinalysis Dipstick (Automated), Ambulatory referral to Gynecology  History of cancer of vulva - Plan: Ambulatory referral to Gynecology  Concerned for vulvar cancer recurrence. Will place urgent referral to gyn. Hopefully just a condyloma.  F/u in 1 mo for DM visit. The patient voiced understanding and agreement to the plan.  Mabel [employer], DO 04/09/24 1:01 PM

## 2024-04-10 ENCOUNTER — Other Ambulatory Visit: Payer: Self-pay | Admitting: Family Medicine

## 2024-04-10 DIAGNOSIS — E1142 Type 2 diabetes mellitus with diabetic polyneuropathy: Secondary | ICD-10-CM

## 2024-04-10 MED ORDER — OXYCODONE HCL 5 MG PO TABS: 2.5000 mg | ORAL_TABLET | Freq: Four times a day (QID) | ORAL | 0 refills | Status: DC | PRN

## 2024-04-11 ENCOUNTER — Other Ambulatory Visit: Payer: Self-pay

## 2024-04-11 ENCOUNTER — Ambulatory Visit: Payer: Self-pay | Admitting: Obstetrics and Gynecology

## 2024-04-11 ENCOUNTER — Encounter: Payer: Self-pay | Admitting: Obstetrics and Gynecology

## 2024-04-11 ENCOUNTER — Other Ambulatory Visit: Payer: Self-pay | Admitting: Family Medicine

## 2024-04-11 ENCOUNTER — Other Ambulatory Visit (HOSPITAL_COMMUNITY)
Admission: RE | Admit: 2024-04-11 | Discharge: 2024-04-11 | Disposition: A | Source: Ambulatory Visit | Attending: Obstetrics and Gynecology | Admitting: Obstetrics and Gynecology

## 2024-04-11 VITALS — BP 141/84 | HR 84 | Ht 61.0 in | Wt 191.9 lb

## 2024-04-11 DIAGNOSIS — N9089 Other specified noninflammatory disorders of vulva and perineum: Secondary | ICD-10-CM | POA: Diagnosis not present

## 2024-04-11 MED ORDER — OXYCODONE HCL 5 MG PO TABS: 2.5000 mg | ORAL_TABLET | Freq: Four times a day (QID) | ORAL | 0 refills | Status: DC | PRN

## 2024-04-11 NOTE — Progress Notes (Signed)
 VULVAR BIOPSY NOTE The indications for vulvar biopsy (rule out neoplasia, establish lichen sclerosus diagnosis) were reviewed.   Risks of the biopsy including pain, bleeding, infection, inadequate specimen, scarring and need for additional procedures  were discussed. The patient stated understanding and agreed to undergo procedure today. Consent was signed,  time out performed.  The patient's vulva was prepped with hurricane spray followed by alcohol swab for local anesthesia. 1% lidocaine  was injected into right upper vulva where obvious lesion was present. A 3-mm punch biopsy was done, biopsy tissue was picked up with sterile forceps and sterile scissors were used to excise the lesion.  Small bleeding was noted and hemostasis was achieved using silver nitrate sticks and pressure.   Pt was on blood thinners so pressure was held for approx 10 minutes.The patient tolerated the procedure well. Post-procedure instructions  (pelvic rest for one week) were given to the patient. The patient is to call with heavy bleeding, fever greater than 100.4, foul smelling vaginal discharge or other concerns. The patient will be referred to gyn inc due to new lesion and history of vulvar cancer.

## 2024-04-11 NOTE — Progress Notes (Signed)
" °  CC: vulvar lesion, hx of vulvar cancer Subjective:    Patient ID: Holly Smith, female    DOB: 1945-04-04, 79 y.o.   MRN: [id number]  HPI 79 yo G2P1 seen for evaluation of right vulvar lesion.  Pt has hx of vulvar cancer treated with wide local excision.  Per path report the margins were clear of cancer, but were close.  She has been biopsied since treatment and had some lichen planus.  Pt noted appearance of new lesion 1-2 months ago.  It is painful to touch and when urine touches it.   Review of Systems     Objective:   Physical Exam Vitals:   04/11/24 1059 04/11/24 1122  BP: (!) 150/84 (!) 141/84  Pulse: 92 84   Vulva: upper right vulva with hypopigmented ulcerated lesion. Lesion 2 x 4 cm  Pictures taken and sent to media      Assessment & Plan:   1. Vulvar lesion (Primary) Biopsy taken, see separate note Due to patient history and new lesion will refer to gyn onc while results are pending  - Surgical pathology( [facility]/ [facility]) - Ambulatory referral to Gynecologic Oncology    Jerilynn DELENA Buddle, MD Faculty Attending, [facility]  "

## 2024-04-11 NOTE — Progress Notes (Signed)
 Pt presents for vulvar lesion. Pt had cancer in the same place in 2016. Pt has no questions or concerns at this time

## 2024-04-12 ENCOUNTER — Telehealth: Payer: Self-pay | Admitting: *Deleted

## 2024-04-12 NOTE — Telephone Encounter (Signed)
 Spoke with the patient regarding the referral to GYN oncology. Patient scheduled as new patient with Dr Viktoria 1/19 at 2:45 pm. Patient given an arrival time of 2:15 pm.  Explained to the patient the the doctor will perform a pelvic exam at this visit. Patient given the policy that only one visitor allowed and that visitor must be over 16 yrs are allowed in the Cancer Center. Patient given the address/phone number for the clinic and that the center offers free valet service. Patient aware that masks optional.

## 2024-04-13 ENCOUNTER — Encounter: Payer: Self-pay | Admitting: Gynecologic Oncology

## 2024-04-13 ENCOUNTER — Other Ambulatory Visit: Payer: Self-pay | Admitting: Gynecologic Oncology

## 2024-04-13 ENCOUNTER — Telehealth: Payer: Self-pay | Admitting: Oncology

## 2024-04-13 DIAGNOSIS — C519 Malignant neoplasm of vulva, unspecified: Secondary | ICD-10-CM

## 2024-04-13 LAB — SURGICAL PATHOLOGY

## 2024-04-13 NOTE — Telephone Encounter (Signed)
 Called Holly Smith and advised that Dr. Viktoria would like her to have a PET scan and is planning for surgery on 04/25/24.  She requested to have an early surgery time if possible and agreed to the plan.  Advised her the PET scan has been scheduled at [facility] on 04/23/24 at 9:00.  Advised her to arrive at 8:30 and for NPO 6 hours before except water.   Discussed that she will need to hold her semaglutide  for 7 days before surgery and also that we will work on getting cardiac clearance for guidance on holding her Xaralto.

## 2024-04-16 ENCOUNTER — Ambulatory Visit: Payer: Self-pay | Admitting: Obstetrics and Gynecology

## 2024-04-17 ENCOUNTER — Telehealth: Payer: Self-pay

## 2024-04-17 ENCOUNTER — Encounter: Payer: Self-pay | Admitting: Obstetrics and Gynecology

## 2024-04-17 ENCOUNTER — Telehealth: Payer: Self-pay | Admitting: *Deleted

## 2024-04-17 NOTE — Telephone Encounter (Signed)
"  °  Patient Consent for Virtual Visit        Holly Smith has provided verbal consent on 04/17/2024 for a virtual visit (video or telephone).   CONSENT FOR VIRTUAL VISIT FOR:  Holly Smith  By participating in this virtual visit I agree to the following:  I hereby voluntarily request, consent and authorize [facility] and its employed or contracted physicians, physician assistants, nurse practitioners or other licensed health care professionals (the Practitioner), to provide me with telemedicine health care services (the Services) as deemed necessary by the treating Practitioner. I acknowledge and consent to receive the Services by the Practitioner via telemedicine. I understand that the telemedicine visit will involve communicating with the Practitioner through live audiovisual communication technology and the disclosure of certain medical information by electronic transmission. I acknowledge that I have been given the opportunity to request an in-person assessment or other available alternative prior to the telemedicine visit and am voluntarily participating in the telemedicine visit.  I understand that I have the right to withhold or withdraw my consent to the use of telemedicine in the course of my care at any time, without affecting my right to future care or treatment, and that the Practitioner or I may terminate the telemedicine visit at any time. I understand that I have the right to inspect all information obtained and/or recorded in the course of the telemedicine visit and may receive copies of available information for a reasonable fee.  I understand that some of the potential risks of receiving the Services via telemedicine include:  Delay or interruption in medical evaluation due to technological equipment failure or disruption; Information transmitted may not be sufficient (e.g. poor resolution of images) to allow for appropriate medical decision making by the  Practitioner; and/or  In rare instances, security protocols could fail, causing a breach of personal health information.  Furthermore, I acknowledge that it is my responsibility to provide information about my medical history, conditions and care that is complete and accurate to the best of my ability. I acknowledge that Practitioner's advice, recommendations, and/or decision may be based on factors not within their control, such as incomplete or inaccurate data provided by me or distortions of diagnostic images or specimens that may result from electronic transmissions. I understand that the practice of medicine is not an exact science and that Practitioner makes no warranties or guarantees regarding treatment outcomes. I acknowledge that a copy of this consent can be made available to me via my patient portal [facility] MyChart), or I can request a printed copy by calling the office of [facility].    I understand that my insurance will be billed for this visit.   I have read or had this consent read to me. I understand the contents of this consent, which adequately explains the benefits and risks of the Services being provided via telemedicine.  I have been provided ample opportunity to ask questions regarding this consent and the Services and have had my questions answered to my satisfaction. I give my informed consent for the services to be provided through the use of telemedicine in my medical care    "

## 2024-04-17 NOTE — Telephone Encounter (Signed)
 Patient with diagnosis of afib on Xarelto  for anticoagulation.    Procedure: Vulvectomy  Date of procedure: 04/19/24   CHA2DS2-VASc Score = 7   This indicates a 11.2% annual risk of stroke. The patient's score is based upon: CHF History: 1 HTN History: 1 Diabetes History: 1 Stroke History: 0 Vascular Disease History: 1 Age Score: 2 Gender Score: 1      CrCl 33.5 ml/min Platelet count 266  Patient has not had an Afib/aflutter ablation in the last 3 months, DCCV within the last 4 weeks or a watchman implanted in the last 45 days   Per office protocol, patient can hold Xarelto  for 2 days prior to procedure.    **This guidance is not considered finalized until pre-operative APP has relayed final recommendations.**

## 2024-04-17 NOTE — Telephone Encounter (Signed)
 Per provider fax surgival optimization form to the patient's PCP office (Dr Frann [phone])

## 2024-04-17 NOTE — Telephone Encounter (Signed)
 Per Dr Viktoria fax surgical optimization form to the patient's cardiology office (Dr Wendel [phone])

## 2024-04-17 NOTE — Telephone Encounter (Signed)
 Will route to PharmD for rec's re: holding anticoagulation. Glendia Ferrier, PA-C    04/17/2024 3:50 PM

## 2024-04-17 NOTE — Telephone Encounter (Signed)
" ° °  Name: Holly Smith  DOB: 10-01-1945  MRN: [id number]  Primary Cardiologist: Arun K Thukkani, MD   Preoperative team, please contact this patient and set up a phone call appointment for further preoperative risk assessment. Please obtain consent and complete medication review. Thank you for your help.  I confirm that guidance regarding antiplatelet and oral anticoagulation therapy has been completed and, if necessary, noted below. - Pending review by PharmD.  I also confirmed the patient resides in the state of [place] . As per [facility] Medical Board telemedicine laws, the patient must reside in the state in which the provider is licensed.   Glendia Ferrier, PA-C 04/17/2024, 3:51 PM [facility]    "

## 2024-04-17 NOTE — Telephone Encounter (Signed)
 Preop tele appt now scheduled, med rec and consent done.

## 2024-04-17 NOTE — Telephone Encounter (Signed)
"  ° °  Pre-operative Risk Assessment    Patient Name: Holly Smith  DOB: 03/15/1946 MRN: [id number]   Date of last office visit: 12/13/23 Date of next office visit: Not scheduled   Request for Surgical Clearance    Procedure:  Vulvectomy  Date of Surgery:  Clearance 04/19/24                                Surgeon:  Comer Dollar Surgeon's Group or Practice Name:  [facility] -- Gynecology Oncology Phone number:  [phone] Fax number:  [phone]   Type of Clearance Requested:   - Medical  - Pharmacy:  Hold Rivaroxaban  (Xarelto ) Pre and post procedure   Type of Anesthesia:  General    Additional requests/questions:    [name]   04/17/2024, 1:38 PM   "

## 2024-04-18 ENCOUNTER — Ambulatory Visit

## 2024-04-18 ENCOUNTER — Encounter: Payer: Self-pay | Admitting: Physician Assistant

## 2024-04-18 ENCOUNTER — Other Ambulatory Visit: Payer: Self-pay | Admitting: Family Medicine

## 2024-04-18 DIAGNOSIS — Z01810 Encounter for preprocedural cardiovascular examination: Secondary | ICD-10-CM

## 2024-04-18 NOTE — H&P (View-Only) (Signed)
 GYNECOLOGIC ONCOLOGY NEW PATIENT CONSULTATION   Patient Name: Holly Smith  Patient Age: 79 y.o. Date of Service: 04/19/24 Referring Provider: Dr. Jerilynn Buddle  Primary Care Provider: Frann Mabel Mt, DO Consulting Provider: Comer Dollar, MD   Assessment/Plan:  79 year old with history of invasive verrucous carcinoma of the vulva diagnosed approximately 12 years ago now with recurrent versus new verrucous carcinoma of the vulva.  Reviewed with the patient her history as well as recent biopsy, which confirmed the same type of cancer that she was initially diagnosed with in 2014.  Although more than a decade has passed since her original diagnosis, the presentation with verrucous features and the location would point towards recurrent disease rather than a new vulvar cancer.  Although no adenopathy is appreciated on exam, discussed importance of getting imaging to rule out local or distant metastatic disease.  She is scheduled for a PET scan on 2/2.  In terms of treatment of her recurrent cancer, since she did not receive radiation previously, discussed recommendation for surgical treatment.  The location of her cancer will necessitate at least a partial clitorectomy.  There appears to be efficient tissue between the inferior aspect of her cancer and the urethra to resect it without necessitating distal urethrectomy.  Given the size of her lesion, she would be a candidate for sentinel lymph node biopsy.  She had a unilateral sentinel lymph node biopsy procedure at the time of her original diagnosis.  Given central location of current lesion, discussed plan for sentinel lymph node injection and biopsy on the left.  There are limited data describing sentinel lymph node mapping and biopsy when this procedure has been previously performed.  Given similar location compared to her original diagnosis, I recommended completion lymphadenectomy on the right whether or not she maps on the  side.  We reviewed the plan for a bilateral inguinal sentinel lymph node biopsy, right versus bilateral inguinal lymphadenectomy, partial anterior modified radical vulvectomy with clitorectomy. The risks of surgery were discussed in detail and she understands these to include infection; wound separation; hernia; vaginal cuff separation, injury to adjacent organs; bleeding which may require blood transfusion; anesthesia risk; thromboembolic events; possible death; unforeseen complications; possible need for re-exploration; medical complications such as heart attack, stroke, pleural effusion and pneumonia; and, if full lymphadenectomy is performed the risk of lymphedema and lymphocyst. The patient will receive DVT and antibiotic prophylaxis as indicated. She voiced a clear understanding. She had the opportunity to ask questions. Perioperative instructions were reviewed with her. Prescriptions for post-op medications were sent to her pharmacy of choice.  In her family history, I recommended referral to our genetic counselors for consideration of genetic testing.  We talked about the benefit of this both to her but also to her family members.  At this time, she is not interested.  We have received cardiac clearance from her cardiologist.  Plan to hold her Xarelto  for 2 days prior to surgery and use a prophylactic dose for 5-7 days after surgery.  A copy of this note was sent to the patient's referring provider.   75 minutes of total time was spent for this patient encounter, including preparation, face-to-face counseling with the patient and coordination of care, and documentation of the encounter.  Comer Dollar, MD  Division of Gynecologic Oncology  Department of Obstetrics and Gynecology  [facility]  ___________________________________________  Chief Complaint: No chief complaint on file.   History of Present Illness:  Holly Smith is a  79 y.o. y.o. female  who is seen in consultation at the request of Dr. Zina for an evaluation of recurrent versus new vulvar cancer.  The patient was initially seen in 2014 for invasive verrucous carcinoma of the vulva. At the time of presentation, she reported a 2 year history of a wart lesion in the upper right labia.  A biopsy in 02/2012 at 2 o'clock was negative and at 9 o'clock HPV effect. In February 2014, she presented to her gynecologist with a mass on the right anterior vulva. A WLE was performed by Dr Lovetta which revealed grade 1 verrucous carcinoma with 2.4 mm invasion. Negative margins but within 2mm of cancer. No LVSI.  Given close margins, reexcision and sentinel lymph node mapping and biopsy were recommended. On 07/02/14, she underwent partial right modified radical vulvectomy with negative SLNs (two on the right, one additional lymph node removed). Vulvectomy with no residual carcinoma, lichen sclerosus present.   Vulvar biopsy performed in 2019 for an erythematous 8 mm lesion along the suture line revealed lichen planus and inflammation.  Mild cytologic atypia present.  Patient was started on clobetasol .  Biopsy performed again from the right vulva in 2021 consistent with fibroepithelial polyp, no significant dysplasia seen.  She was last seen by Dr. Mancil in 2021 for follow-up.  She has not seen any GYN or GYN oncologist until her visit last week.  Patient seen recently for a new right vulvar lesion for the last 1-2 months. Biopsy of this lesion showed recurrent verrucous squamous cell carcinoma, depth of invasion approximately 1 mm, extending to the biopsy margins.   Today, the patient reports overall doing well.  She notes vulvar irritation for the last 2 to 3 months that was similar to when her cancer was diagnosed in 2014.  Since the biopsy, she has had increased irritation, especially when any urine gets on the area.  Bled for 2 days but this has stopped.  She is using oxycodone  as needed for  pain.  She endorses bowel function at her baseline.  Has some urinary incontinence.  Endorses decreased appetite since her diabetes regimen was changed recently and has lost about 10 pounds in the last several months.  Checks her CBGs at home, typically in the morning.  Notes that the highest it has been recently was 154.  Denies ever having genetic testing in the setting of her family history of pancreatic cancer and 1 brother and prostate cancer and another brother.  PAST MEDICAL HISTORY:  Past Medical History:  Diagnosis Date   Abnormal Pap smear of vagina and vaginal HPV    Anticoagulation adequate 08/02/2014   Overview:  Xarelto  20hs; CHADS=1+ htn, female, age  Overview:  Xarelto  20hs; CHADS=1+ htn, female, age   Arthritis    bones ache (01/23/2015)   Atrial fibrillation, persistent (HCC) 08/13/2015   Overview:  symptomatic (fatigue, DOE, decreased exercise tolerance, palpitations), drug refractory (propafenone, amiodarone), complicated recovery from DCCV possibly related to repiratory obstruction [facility] (patient fear of DCCV)  Overview:  symptomatic (fatigue, DOE, decreased exercise tolerance, palpitations), drug refractory (propafenone, amiodarone), complicated recovery from DCCV possibly relat   Benign neoplasm of ascending colon    Benign neoplasm of descending colon    Benign neoplasm of sigmoid colon    Breath shortness 08/20/2013   CHF (congestive heart failure) (HCC)    Chronic atrial fibrillation (HCC)    Chronic combined systolic and diastolic CHF, NYHA class 3 (HCC) 01/28/2015   Essential hypertension 10/07/2014   Trial  off acei 10/07/2014  Due to atypical sob (talking = walking)> no real change 11/18/2014     Family history of adverse reaction to anesthesia    sister gets very nauseated and vomits   GERD (gastroesophageal reflux disease)    History of kidney stones    HPV (human papilloma virus) anogenital infection    03/21/12 PAP + HR HPV   Mitral regurgitation  04/23/2013   Overview:  Moderate to severe by TTE, mild to mod by TEE  Overview:  Moderate to severe by TTE, mild to mod by TEE   Obesity    OSA (obstructive sleep apnea) 11/27/2014   07/2013 RDI 17/h CPAP 8    Other specified diseases of esophagus    Postoperative nausea and vomiting 1983   many years ago with hysterectomy   PVC (premature ventricular contraction) 12/16/2014   TI (tricuspid incompetence) 04/23/2013   Overview:  moderate by TTE    Urolithiasis    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 History of genital warts and vulvar condyloma     PAST SURGICAL HISTORY:  Past Surgical History:  Procedure Laterality Date   ABDOMINAL HYSTERECTOMY     APPENDECTOMY     BILATERAL SALPINGOOPHORECTOMY  2003   benign ovarian cancer   CARDIOVERSION  2014-2015   [facility]   COLONOSCOPY WITH PROPOFOL  N/A 06/10/2015   Procedure: COLONOSCOPY WITH PROPOFOL ;  Surgeon: Rogelia Copping, MD;  Location: [facility] ENDOSCOPY;  Service: Endoscopy;  Laterality: N/A;   CORONARY STENT INTERVENTION N/A 01/18/2019   Procedure: CORONARY STENT INTERVENTION;  Surgeon: Dann Candyce RAMAN, MD;  Location: [facility] INVASIVE CV LAB;  Service: Cardiovascular;  Laterality: N/A;   CYSTOSCOPY W/ RETROGRADES Bilateral 10/19/2016   Procedure: CYSTOSCOPY WITH RETROGRADE PYELOGRAM;  Surgeon: Penne Knee, MD;  Location: [facility] ORS;  Service: Urology;  Laterality: Bilateral;   CYSTOSCOPY W/ URETERAL STENT PLACEMENT Left 12/29/2016   Procedure: CYSTOSCOPY WITH STENT REPLACEMENT;  Surgeon: Penne Knee, MD;  Location: [facility] ORS;  Service: Urology;  Laterality: Left;   CYSTOSCOPY WITH BIOPSY N/A 10/19/2016   Procedure: CYSTOSCOPY WITH BLADDER BIOPSY, VAGINAL WALL BIOPSY;  Surgeon: Penne Knee, MD;  Location: [facility] ORS;  Service: Urology;  Laterality: N/A;   CYSTOSCOPY WITH STENT PLACEMENT Left 12/14/2016   Procedure: CYSTOSCOPY WITH STENT PLACEMENT;  Surgeon: Penne Knee, MD;   Location: [facility] ORS;  Service: Urology;  Laterality: Left;   ESOPHAGOGASTRODUODENOSCOPY (EGD) WITH PROPOFOL  N/A 10/05/2016   Surgeon: Copping Rogelia, MD;  Location: [facility] ENDOSCOPY;  Results: Barrett's Esophagus- repeat in 3 years 09/2019   LEFT HEART CATH AND CORONARY ANGIOGRAPHY N/A 01/18/2019   Procedure: LEFT HEART CATH AND CORONARY ANGIOGRAPHY;  Surgeon: Dann Candyce RAMAN, MD;  Location: [facility] INVASIVE CV LAB;  Service: Cardiovascular;  Laterality: N/A;   LITHOTRIPSY     STONE EXTRACTION WITH BASKET Left 12/29/2016   Procedure: STONE EXTRACTION WITH BASKET;  Surgeon: Penne Knee, MD;  Location: [facility] ORS;  Service: Urology;  Laterality: Left;   TONSILLECTOMY     URETEROSCOPY Left 12/29/2016   Procedure: URETEROSCOPY;  Surgeon: Penne Knee, MD;  Location: [facility] ORS;  Service: Urology;  Laterality: Left;   VULVECTOMY Right 05/10/2014   Excisional biopsy of the superior right labial majora mass; [facility]   VULVECTOMY PARTIAL  07/02/2014   Re-excision and sentinel node dissection at [facility].     OB/GYN HISTORY:  OB History  Gravida Para Term Preterm AB Living  2    1 1   SAB IAB  Ectopic Multiple Live Births  1        # Outcome Date GA Lbr Len/2nd Weight Sex Type Anes PTL Lv  2 Gravida           1 SAB             No LMP recorded. Patient has had a hysterectomy.   SCREENING STUDIES:  Last mammogram: 2024  Last colonoscopy: 2017  MEDICATIONS: Outpatient Encounter Medications as of 04/19/2024  Medication Sig   atorvastatin  (LIPITOR) 40 MG tablet TAKE 1 TABLET BY MOUTH EVERY DAY   Cholecalciferol (VITAMIN D3) 1000 units CAPS Take by mouth.   FARXIGA  10 MG TABS tablet TAKE 1 TABLET BY MOUTH EVERY DAY   fluconazole  (DIFLUCAN ) 150 MG tablet Take 1 tab, repeat in 72 hours if no improvement.   fluticasone  (FLONASE ) 50 MCG/ACT nasal spray Place 2 sprays into both nostrils daily.   glipiZIDE  (GLUCOTROL ) 10 MG tablet TAKE 1 TABLET (10 MG TOTAL) BY MOUTH TWICE A DAY BEFORE A MEAL    irbesartan  (AVAPRO ) 75 MG tablet Take 0.5 tablets (37.5 mg total) by mouth daily.   metoprolol  tartrate (LOPRESSOR ) 100 MG tablet Take 1 & 1/2 tabs (150mg ) by mouth 2 (two) times daily.   nitroGLYCERIN  (NITROSTAT ) 0.4 MG SL tablet Place 1 tablet (0.4 mg total) under the tongue every 5 (five) minutes as needed.   oxyCODONE  (OXY IR/ROXICODONE ) 5 MG immediate release tablet Take 0.5-1 tablets (2.5-5 mg total) by mouth every 6 (six) hours as needed for severe pain (pain score 7-10).   pantoprazole  (PROTONIX ) 40 MG tablet Take 1 tablet (40 mg total) by mouth daily.   Rivaroxaban  (XARELTO ) 15 MG TABS tablet Take 1 tablet (15 mg total) by mouth daily with supper.   senna-docusate (SENOKOT-S) 8.6-50 MG tablet Take 2 tablets by mouth at bedtime. For AFTER surgery, do not take if having diarrhea   torsemide  (DEMADEX ) 20 MG tablet TAKE 2 TABLETS BY MOUTH EVERY DAY   traZODone  (DESYREL ) 100 MG tablet Take 1 tablet (100 mg total) by mouth at bedtime.   vitamin B-12 (CYANOCOBALAMIN ) 1000 MCG tablet Take 1,000 mcg by mouth every other day.    [DISCONTINUED] Semaglutide  (RYBELSUS ) 7 MG TABS Take 1 tablet (7 mg total) by mouth daily. Needs appt   DULoxetine  (CYMBALTA ) 20 MG capsule TAKE 1 CAPSULE BY MOUTH EVERY DAY   [DISCONTINUED] potassium chloride  SA (K-DUR,KLOR-CON ) 20 MEQ tablet Take 20 mEq by mouth daily.   No facility-administered encounter medications on file as of 04/19/2024.    ALLERGIES:  Allergies[1]   FAMILY HISTORY:  Family History  Problem Relation Age of Onset   Prostate cancer Brother 54       still living and well   Heart attack Father    Stroke Mother    Diabetes Mother    Hypertension Mother    Heart attack Brother    Pancreatic cancer Brother    Heart disease Sister    Breast cancer Neg Hx    Kidney cancer Neg Hx    Bladder Cancer Neg Hx      SOCIAL HISTORY:  Social Connections: Socially Isolated (04/06/2024)   Social Connection and Isolation Panel    Frequency of  Communication with Friends and Family: More than three times a week    Frequency of Social Gatherings with Friends and Family: More than three times a week    Attends Religious Services: Never    Database Administrator or Organizations: No    Attends  Banker Meetings: Not on file    Marital Status: Widowed    REVIEW OF SYSTEMS:  + frequency Denies appetite changes, fevers, chills, fatigue, unexplained weight changes. Denies hearing loss, neck lumps or masses, mouth sores, ringing in ears or voice changes. Denies cough or wheezing.  Denies shortness of breath. Denies chest pain or palpitations. Denies leg swelling. Denies abdominal distention, pain, blood in stools, constipation, diarrhea, nausea, vomiting, or early satiety. Denies pain with intercourse, dysuria, hematuria or incontinence. Denies hot flashes, pelvic pain, vaginal bleeding or vaginal discharge.   Denies joint pain, back pain or muscle pain/cramps. Denies itching, rash, or wounds. Denies dizziness, headaches, numbness or seizures. Denies swollen lymph nodes or glands, denies easy bruising or bleeding. Denies anxiety, depression, confusion, or decreased concentration.  Physical Exam:  Vital Signs for this encounter:  Blood pressure 126/78, pulse 91, temperature 98 F (36.7 C), temperature source Oral, resp. rate 19, height 5' 1 (1.549 m), weight 188 lb 12.8 oz (85.6 kg), SpO2 96%. Body mass index is 35.67 kg/m. General: Alert, oriented, no acute distress.  HEENT: Normocephalic, atraumatic. Sclera anicteric.  Chest: Clear to auscultation bilaterally. No wheezes, rhonchi, or rales. Cardiovascular: Regular rate and rhythm, no murmurs, rubs, or gallops.  Abdomen: Obese. Normoactive bowel sounds. Soft, nondistended, nontender to palpation. No masses or hepatosplenomegaly appreciated. No palpable fluid wave.  Extremities: Grossly normal range of motion. Warm, well perfused. No edema bilaterally.  Skin: No  rashes or lesions.  Lymphatics: No cervical, supraclavicular, or inguinal adenopathy.  GU:  Normal external female genitalia.  No discharge or bleeding.  There is an approximately 3 x 4 cm verrucous appearing lesion that is hypopigmented and replacing the clitoris, more on the right but crossing midline.  It is at least 2-3 cm from the urethral opening.  No other vulvar lesions.             Bladder/urethra:  No lesions or masses, well supported bladder             Vagina: On gentle 1 digit exam, no masses palpated within the vagina.             Cervix/uterus: surgically absent.             Adnexa: No masses palpated.  Rectal: Deferred.   LABORATORY AND RADIOLOGIC DATA:  Outside medical records were reviewed to synthesize the above history, along with the history and physical obtained during the visit.   Lab Results  Component Value Date   WBC 9.4 03/26/2024   HGB 16.5 (H) 03/26/2024   HCT 51.0 (H) 03/26/2024   PLT 266 03/26/2024   GLUCOSE 108 (H) 03/26/2024   CHOL 177 06/20/2023   TRIG 207.0 (H) 06/20/2023   HDL 38.60 (L) 06/20/2023   LDLCALC 97 06/20/2023   ALT 35 03/26/2024   AST 39 03/26/2024   NA 138 03/26/2024   K 3.6 03/26/2024   CL 100 03/26/2024   CREATININE 1.34 (H) 03/26/2024   BUN 25 (H) 03/26/2024   CO2 23 03/26/2024   TSH 1.37 08/12/2023   INR 1.85 (H) 01/21/2015   HGBA1C 6.5 06/20/2023   MICROALBUR 4.1 (H) 06/20/2023        [1]  Allergies Allergen Reactions   Ciprofloxacin  Other (See Comments)    SOB   Macrobid  [Nitrofurantoin ] Shortness Of Breath

## 2024-04-18 NOTE — Telephone Encounter (Signed)
 Notes faxed to surgeon.  Glendia Ferrier, PA-C  04/18/2024 4:59 PM

## 2024-04-18 NOTE — Progress Notes (Unsigned)
 GYNECOLOGIC ONCOLOGY NEW PATIENT CONSULTATION   Patient Name: Holly Smith  Patient Age: 79 y.o. Date of Service: 04/19/24 Referring Provider: Dr. Jerilynn Buddle  Primary Care Provider: Frann Mabel Mt, DO Consulting Provider: Comer Dollar, MD   Assessment/Plan:  79 year old with history of invasive verrucous carcinoma of the vulva diagnosed approximately 12 years ago now with recurrent versus new verrucous carcinoma of the vulva.  Reviewed with the patient her history as well as recent biopsy, which confirmed the same type of cancer that she was initially diagnosed with in 2014.  Although more than a decade has passed since her original diagnosis, the presentation with verrucous features and the location would point towards recurrent disease rather than a new vulvar cancer.  Although no adenopathy is appreciated on exam, discussed importance of getting imaging to rule out local or distant metastatic disease.  She is scheduled for a PET scan on 2/2.  In terms of treatment of her recurrent cancer, since she did not receive radiation previously, discussed recommendation for surgical treatment.  The location of her cancer will necessitate at least a partial clitorectomy.  There appears to be efficient tissue between the inferior aspect of her cancer and the urethra to resect it without necessitating distal urethrectomy.  Given the size of her lesion, she would be a candidate for sentinel lymph node biopsy.  She had a unilateral sentinel lymph node biopsy procedure at the time of her original diagnosis.  Given central location of current lesion, discussed plan for sentinel lymph node injection and biopsy on the left.  There are limited data describing sentinel lymph node mapping and biopsy when this procedure has been previously performed.  Given similar location compared to her original diagnosis, I recommended completion lymphadenectomy on the right whether or not she maps on the  side.  We reviewed the plan for a bilateral inguinal sentinel lymph node biopsy, right versus bilateral inguinal lymphadenectomy, partial anterior modified radical vulvectomy with clitorectomy. The risks of surgery were discussed in detail and she understands these to include infection; wound separation; hernia; vaginal cuff separation, injury to adjacent organs; bleeding which may require blood transfusion; anesthesia risk; thromboembolic events; possible death; unforeseen complications; possible need for re-exploration; medical complications such as heart attack, stroke, pleural effusion and pneumonia; and, if full lymphadenectomy is performed the risk of lymphedema and lymphocyst. The patient will receive DVT and antibiotic prophylaxis as indicated. She voiced a clear understanding. She had the opportunity to ask questions. Perioperative instructions were reviewed with her. Prescriptions for post-op medications were sent to her pharmacy of choice.  In her family history, I recommended referral to our genetic counselors for consideration of genetic testing.  We talked about the benefit of this both to her but also to her family members.  At this time, she is not interested.  We have received cardiac clearance from her cardiologist.  Plan to hold her Xarelto  for 2 days prior to surgery and use a prophylactic dose for 5-7 days after surgery.  A copy of this note was sent to the patient's referring provider.   75 minutes of total time was spent for this patient encounter, including preparation, face-to-face counseling with the patient and coordination of care, and documentation of the encounter.  Comer Dollar, MD  Division of Gynecologic Oncology  Department of Obstetrics and Gynecology  [facility]  ___________________________________________  Chief Complaint: No chief complaint on file.   History of Present Illness:  Holly Smith is a  79 y.o. y.o. female  who is seen in consultation at the request of Dr. Zina for an evaluation of recurrent versus new vulvar cancer.  The patient was initially seen in 2014 for invasive verrucous carcinoma of the vulva. At the time of presentation, she reported a 2 year history of a wart lesion in the upper right labia.  A biopsy in 02/2012 at 2 o'clock was negative and at 9 o'clock HPV effect. In February 2014, she presented to her gynecologist with a mass on the right anterior vulva. A WLE was performed by Dr Lovetta which revealed grade 1 verrucous carcinoma with 2.4 mm invasion. Negative margins but within 2mm of cancer. No LVSI.  Given close margins, reexcision and sentinel lymph node mapping and biopsy were recommended. On 07/02/14, she underwent partial right modified radical vulvectomy with negative SLNs (two on the right, one additional lymph node removed). Vulvectomy with no residual carcinoma, lichen sclerosus present.   Vulvar biopsy performed in 2019 for an erythematous 8 mm lesion along the suture line revealed lichen planus and inflammation.  Mild cytologic atypia present.  Patient was started on clobetasol .  Biopsy performed again from the right vulva in 2021 consistent with fibroepithelial polyp, no significant dysplasia seen.  She was last seen by Dr. Mancil in 2021 for follow-up.  She has not seen any GYN or GYN oncologist until her visit last week.  Patient seen recently for a new right vulvar lesion for the last 1-2 months. Biopsy of this lesion showed recurrent verrucous squamous cell carcinoma, depth of invasion approximately 1 mm, extending to the biopsy margins.   Today, the patient reports overall doing well.  She notes vulvar irritation for the last 2 to 3 months that was similar to when her cancer was diagnosed in 2014.  Since the biopsy, she has had increased irritation, especially when any urine gets on the area.  Bled for 2 days but this has stopped.  She is using oxycodone  as needed for  pain.  She endorses bowel function at her baseline.  Has some urinary incontinence.  Endorses decreased appetite since her diabetes regimen was changed recently and has lost about 10 pounds in the last several months.  Checks her CBGs at home, typically in the morning.  Notes that the highest it has been recently was 154.  Denies ever having genetic testing in the setting of her family history of pancreatic cancer and 1 brother and prostate cancer and another brother.  PAST MEDICAL HISTORY:  Past Medical History:  Diagnosis Date   Abnormal Pap smear of vagina and vaginal HPV    Anticoagulation adequate 08/02/2014   Overview:  Xarelto  20hs; CHADS=1+ htn, female, age  Overview:  Xarelto  20hs; CHADS=1+ htn, female, age   Arthritis    bones ache (01/23/2015)   Atrial fibrillation, persistent (HCC) 08/13/2015   Overview:  symptomatic (fatigue, DOE, decreased exercise tolerance, palpitations), drug refractory (propafenone, amiodarone), complicated recovery from DCCV possibly related to repiratory obstruction [facility] (patient fear of DCCV)  Overview:  symptomatic (fatigue, DOE, decreased exercise tolerance, palpitations), drug refractory (propafenone, amiodarone), complicated recovery from DCCV possibly relat   Benign neoplasm of ascending colon    Benign neoplasm of descending colon    Benign neoplasm of sigmoid colon    Breath shortness 08/20/2013   CHF (congestive heart failure) (HCC)    Chronic atrial fibrillation (HCC)    Chronic combined systolic and diastolic CHF, NYHA class 3 (HCC) 01/28/2015   Essential hypertension 10/07/2014   Trial  off acei 10/07/2014  Due to atypical sob (talking = walking)> no real change 11/18/2014     Family history of adverse reaction to anesthesia    sister gets very nauseated and vomits   GERD (gastroesophageal reflux disease)    History of kidney stones    HPV (human papilloma virus) anogenital infection    03/21/12 PAP + HR HPV   Mitral regurgitation  04/23/2013   Overview:  Moderate to severe by TTE, mild to mod by TEE  Overview:  Moderate to severe by TTE, mild to mod by TEE   Obesity    OSA (obstructive sleep apnea) 11/27/2014   07/2013 RDI 17/h CPAP 8    Other specified diseases of esophagus    Postoperative nausea and vomiting 1983   many years ago with hysterectomy   PVC (premature ventricular contraction) 12/16/2014   TI (tricuspid incompetence) 04/23/2013   Overview:  moderate by TTE    Urolithiasis    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 History of genital warts and vulvar condyloma     PAST SURGICAL HISTORY:  Past Surgical History:  Procedure Laterality Date   ABDOMINAL HYSTERECTOMY     APPENDECTOMY     BILATERAL SALPINGOOPHORECTOMY  2003   benign ovarian cancer   CARDIOVERSION  2014-2015   [facility]   COLONOSCOPY WITH PROPOFOL  N/A 06/10/2015   Procedure: COLONOSCOPY WITH PROPOFOL ;  Surgeon: Rogelia Copping, MD;  Location: [facility] ENDOSCOPY;  Service: Endoscopy;  Laterality: N/A;   CORONARY STENT INTERVENTION N/A 01/18/2019   Procedure: CORONARY STENT INTERVENTION;  Surgeon: Dann Candyce RAMAN, MD;  Location: [facility] INVASIVE CV LAB;  Service: Cardiovascular;  Laterality: N/A;   CYSTOSCOPY W/ RETROGRADES Bilateral 10/19/2016   Procedure: CYSTOSCOPY WITH RETROGRADE PYELOGRAM;  Surgeon: Penne Knee, MD;  Location: [facility] ORS;  Service: Urology;  Laterality: Bilateral;   CYSTOSCOPY W/ URETERAL STENT PLACEMENT Left 12/29/2016   Procedure: CYSTOSCOPY WITH STENT REPLACEMENT;  Surgeon: Penne Knee, MD;  Location: [facility] ORS;  Service: Urology;  Laterality: Left;   CYSTOSCOPY WITH BIOPSY N/A 10/19/2016   Procedure: CYSTOSCOPY WITH BLADDER BIOPSY, VAGINAL WALL BIOPSY;  Surgeon: Penne Knee, MD;  Location: [facility] ORS;  Service: Urology;  Laterality: N/A;   CYSTOSCOPY WITH STENT PLACEMENT Left 12/14/2016   Procedure: CYSTOSCOPY WITH STENT PLACEMENT;  Surgeon: Penne Knee, MD;   Location: [facility] ORS;  Service: Urology;  Laterality: Left;   ESOPHAGOGASTRODUODENOSCOPY (EGD) WITH PROPOFOL  N/A 10/05/2016   Surgeon: Copping Rogelia, MD;  Location: [facility] ENDOSCOPY;  Results: Barrett's Esophagus- repeat in 3 years 09/2019   LEFT HEART CATH AND CORONARY ANGIOGRAPHY N/A 01/18/2019   Procedure: LEFT HEART CATH AND CORONARY ANGIOGRAPHY;  Surgeon: Dann Candyce RAMAN, MD;  Location: [facility] INVASIVE CV LAB;  Service: Cardiovascular;  Laterality: N/A;   LITHOTRIPSY     STONE EXTRACTION WITH BASKET Left 12/29/2016   Procedure: STONE EXTRACTION WITH BASKET;  Surgeon: Penne Knee, MD;  Location: [facility] ORS;  Service: Urology;  Laterality: Left;   TONSILLECTOMY     URETEROSCOPY Left 12/29/2016   Procedure: URETEROSCOPY;  Surgeon: Penne Knee, MD;  Location: [facility] ORS;  Service: Urology;  Laterality: Left;   VULVECTOMY Right 05/10/2014   Excisional biopsy of the superior right labial majora mass; [facility]   VULVECTOMY PARTIAL  07/02/2014   Re-excision and sentinel node dissection at [facility].     OB/GYN HISTORY:  OB History  Gravida Para Term Preterm AB Living  2    1 1   SAB IAB  Ectopic Multiple Live Births  1        # Outcome Date GA Lbr Len/2nd Weight Sex Type Anes PTL Lv  2 Gravida           1 SAB             No LMP recorded. Patient has had a hysterectomy.   SCREENING STUDIES:  Last mammogram: 2024  Last colonoscopy: 2017  MEDICATIONS: Outpatient Encounter Medications as of 04/19/2024  Medication Sig   atorvastatin  (LIPITOR) 40 MG tablet TAKE 1 TABLET BY MOUTH EVERY DAY   Cholecalciferol (VITAMIN D3) 1000 units CAPS Take by mouth.   FARXIGA  10 MG TABS tablet TAKE 1 TABLET BY MOUTH EVERY DAY   fluconazole  (DIFLUCAN ) 150 MG tablet Take 1 tab, repeat in 72 hours if no improvement.   fluticasone  (FLONASE ) 50 MCG/ACT nasal spray Place 2 sprays into both nostrils daily.   glipiZIDE  (GLUCOTROL ) 10 MG tablet TAKE 1 TABLET (10 MG TOTAL) BY MOUTH TWICE A DAY BEFORE A MEAL    irbesartan  (AVAPRO ) 75 MG tablet Take 0.5 tablets (37.5 mg total) by mouth daily.   metoprolol  tartrate (LOPRESSOR ) 100 MG tablet Take 1 & 1/2 tabs (150mg ) by mouth 2 (two) times daily.   nitroGLYCERIN  (NITROSTAT ) 0.4 MG SL tablet Place 1 tablet (0.4 mg total) under the tongue every 5 (five) minutes as needed.   oxyCODONE  (OXY IR/ROXICODONE ) 5 MG immediate release tablet Take 0.5-1 tablets (2.5-5 mg total) by mouth every 6 (six) hours as needed for severe pain (pain score 7-10).   pantoprazole  (PROTONIX ) 40 MG tablet Take 1 tablet (40 mg total) by mouth daily.   Rivaroxaban  (XARELTO ) 15 MG TABS tablet Take 1 tablet (15 mg total) by mouth daily with supper.   senna-docusate (SENOKOT-S) 8.6-50 MG tablet Take 2 tablets by mouth at bedtime. For AFTER surgery, do not take if having diarrhea   torsemide  (DEMADEX ) 20 MG tablet TAKE 2 TABLETS BY MOUTH EVERY DAY   traZODone  (DESYREL ) 100 MG tablet Take 1 tablet (100 mg total) by mouth at bedtime.   vitamin B-12 (CYANOCOBALAMIN ) 1000 MCG tablet Take 1,000 mcg by mouth every other day.    [DISCONTINUED] Semaglutide  (RYBELSUS ) 7 MG TABS Take 1 tablet (7 mg total) by mouth daily. Needs appt   DULoxetine  (CYMBALTA ) 20 MG capsule TAKE 1 CAPSULE BY MOUTH EVERY DAY   [DISCONTINUED] potassium chloride  SA (K-DUR,KLOR-CON ) 20 MEQ tablet Take 20 mEq by mouth daily.   No facility-administered encounter medications on file as of 04/19/2024.    ALLERGIES:  Allergies[1]   FAMILY HISTORY:  Family History  Problem Relation Age of Onset   Prostate cancer Brother 50       still living and well   Heart attack Father    Stroke Mother    Diabetes Mother    Hypertension Mother    Heart attack Brother    Pancreatic cancer Brother    Heart disease Sister    Breast cancer Neg Hx    Kidney cancer Neg Hx    Bladder Cancer Neg Hx      SOCIAL HISTORY:  Social Connections: Socially Isolated (04/06/2024)   Social Connection and Isolation Panel    Frequency of  Communication with Friends and Family: More than three times a week    Frequency of Social Gatherings with Friends and Family: More than three times a week    Attends Religious Services: Never    Database Administrator or Organizations: No    Attends  Banker Meetings: Not on file    Marital Status: Widowed    REVIEW OF SYSTEMS:  + frequency Denies appetite changes, fevers, chills, fatigue, unexplained weight changes. Denies hearing loss, neck lumps or masses, mouth sores, ringing in ears or voice changes. Denies cough or wheezing.  Denies shortness of breath. Denies chest pain or palpitations. Denies leg swelling. Denies abdominal distention, pain, blood in stools, constipation, diarrhea, nausea, vomiting, or early satiety. Denies pain with intercourse, dysuria, hematuria or incontinence. Denies hot flashes, pelvic pain, vaginal bleeding or vaginal discharge.   Denies joint pain, back pain or muscle pain/cramps. Denies itching, rash, or wounds. Denies dizziness, headaches, numbness or seizures. Denies swollen lymph nodes or glands, denies easy bruising or bleeding. Denies anxiety, depression, confusion, or decreased concentration.  Physical Exam:  Vital Signs for this encounter:  Blood pressure 126/78, pulse 91, temperature 98 F (36.7 C), temperature source Oral, resp. rate 19, height 5' 1 (1.549 m), weight 188 lb 12.8 oz (85.6 kg), SpO2 96%. Body mass index is 35.67 kg/m. General: Alert, oriented, no acute distress.  HEENT: Normocephalic, atraumatic. Sclera anicteric.  Chest: Clear to auscultation bilaterally. No wheezes, rhonchi, or rales. Cardiovascular: Regular rate and rhythm, no murmurs, rubs, or gallops.  Abdomen: Obese. Normoactive bowel sounds. Soft, nondistended, nontender to palpation. No masses or hepatosplenomegaly appreciated. No palpable fluid wave.  Extremities: Grossly normal range of motion. Warm, well perfused. No edema bilaterally.  Skin: No  rashes or lesions.  Lymphatics: No cervical, supraclavicular, or inguinal adenopathy.  GU:  Normal external female genitalia.  No discharge or bleeding.  There is an approximately 3 x 4 cm verrucous appearing lesion that is hypopigmented and replacing the clitoris, more on the right but crossing midline.  It is at least 2-3 cm from the urethral opening.  No other vulvar lesions.             Bladder/urethra:  No lesions or masses, well supported bladder             Vagina: On gentle 1 digit exam, no masses palpated within the vagina.             Cervix/uterus: surgically absent.             Adnexa: No masses palpated.  Rectal: Deferred.   LABORATORY AND RADIOLOGIC DATA:  Outside medical records were reviewed to synthesize the above history, along with the history and physical obtained during the visit.   Lab Results  Component Value Date   WBC 9.4 03/26/2024   HGB 16.5 (H) 03/26/2024   HCT 51.0 (H) 03/26/2024   PLT 266 03/26/2024   GLUCOSE 108 (H) 03/26/2024   CHOL 177 06/20/2023   TRIG 207.0 (H) 06/20/2023   HDL 38.60 (L) 06/20/2023   LDLCALC 97 06/20/2023   ALT 35 03/26/2024   AST 39 03/26/2024   NA 138 03/26/2024   K 3.6 03/26/2024   CL 100 03/26/2024   CREATININE 1.34 (H) 03/26/2024   BUN 25 (H) 03/26/2024   CO2 23 03/26/2024   TSH 1.37 08/12/2023   INR 1.85 (H) 01/21/2015   HGBA1C 6.5 06/20/2023   MICROALBUR 4.1 (H) 06/20/2023        [1]  Allergies Allergen Reactions   Ciprofloxacin  Other (See Comments)    SOB   Macrobid  [Nitrofurantoin ] Shortness Of Breath

## 2024-04-18 NOTE — Progress Notes (Signed)
"  ° °  Virtual Visit via Telephone Note for Surgical Clearance    Because of [name] co-morbid illnesses, she is at least at moderate risk for complications without adequate follow up.  This format is felt to be most appropriate for this patient at this time.  Due to technical limitations with video connection web designer), today's appointment will be conducted as an audio only telehealth visit, and [name] verbally agreed to proceed in this manner.   All issues noted in this document were discussed and addressed.  No physical exam could be performed with this format.  Evaluation Performed:  Preoperative cardiovascular risk assessment _____________   Date:  04/18/2024   Patient ID:  Holly Smith, Holly Smith 1946/01/19, MRN [id number]  Patient Location:  Provider location:  Home Office   Primary Care Provider:  Frann Mabel Mt, DO Primary Cardiologist:  Lurena MARLA Red, MD  Patient Profile  Permanent atrial fibrillation (HFpEF) heart failure with preserved ejection fraction TTE 01/17/2024: EF 60-65, mild MR Coronary artery disease S/p 2.75 x 20 mm, 2.25 x 28 mm DES (not overlapping) to mLAD Diabetes mellitus Chronic kidney disease Hypertension Hyperlipidemia  History of Present Illness    Holly Smith is a 79 y.o. female who presents via audio/video conferencing for a telehealth visit today.   Pt was last seen in cardiology clinic on 12/13/23 by Dr. Red.  At that time Arnesia Braedyn Riggle was doing well .    Pending Surgery: Vulvectomy Type of anesthesia: General  Medications to be held: Xarelto   Since her last visit, she has done   Physical Exam  Vital Signs:  Reneshia Naviyah Schaffert does not have vital signs available for review today. Given telephonic nature of communication, physical exam is limited. AAOx3. NAD. Normal affect.  Speech and respirations are unlabored.  Recent Labs    03/26/24 2359  CREATININE 1.34*    Assessment  & Plan   Assessment & Plan Preoperative cardiovascular examination Ms. Fister's perioperative risk of a major cardiac event is 0.9% according to the Revised Cardiac Risk Index (RCRI).  Therefore, she is at low risk for perioperative complications.   Her functional capacity is fair at 4.06 METs according to the Duke Activity Status Index (DASI). Recommendations: According to ACC/AHA guidelines, no further cardiovascular testing needed.  The patient may proceed to surgery at acceptable risk.   Antiplatelet and/or Anticoagulation Recommendations: Xarelto  (Rivaroxaban ) can be held for 2 days prior to surgery.  Please resume post op when felt to be safe.     A copy of this note will be routed to requesting surgeon.  Time:   Today, I have spent 13 minutes with the patient with telehealth technology discussing medical history, symptoms, and management plan.     Glendia Ferrier, PA-C 04/18/2024, 4:01 PM  "

## 2024-04-19 ENCOUNTER — Ambulatory Visit

## 2024-04-19 ENCOUNTER — Other Ambulatory Visit (HOSPITAL_COMMUNITY): Payer: Self-pay | Admitting: Gynecologic Oncology

## 2024-04-19 ENCOUNTER — Inpatient Hospital Stay

## 2024-04-19 ENCOUNTER — Inpatient Hospital Stay: Admitting: Gynecologic Oncology

## 2024-04-19 ENCOUNTER — Encounter: Payer: Self-pay | Admitting: Gynecologic Oncology

## 2024-04-19 VITALS — BP 126/78 | HR 91 | Temp 98.0°F | Resp 19 | Ht 61.0 in | Wt 188.8 lb

## 2024-04-19 DIAGNOSIS — Z90710 Acquired absence of both cervix and uterus: Secondary | ICD-10-CM | POA: Insufficient documentation

## 2024-04-19 DIAGNOSIS — Z7984 Long term (current) use of oral hypoglycemic drugs: Secondary | ICD-10-CM | POA: Insufficient documentation

## 2024-04-19 DIAGNOSIS — Z90722 Acquired absence of ovaries, bilateral: Secondary | ICD-10-CM | POA: Insufficient documentation

## 2024-04-19 DIAGNOSIS — Z87442 Personal history of urinary calculi: Secondary | ICD-10-CM | POA: Insufficient documentation

## 2024-04-19 DIAGNOSIS — Z8042 Family history of malignant neoplasm of prostate: Secondary | ICD-10-CM | POA: Insufficient documentation

## 2024-04-19 DIAGNOSIS — L439 Lichen planus, unspecified: Secondary | ICD-10-CM | POA: Diagnosis not present

## 2024-04-19 DIAGNOSIS — C519 Malignant neoplasm of vulva, unspecified: Secondary | ICD-10-CM | POA: Insufficient documentation

## 2024-04-19 DIAGNOSIS — M1990 Unspecified osteoarthritis, unspecified site: Secondary | ICD-10-CM | POA: Insufficient documentation

## 2024-04-19 DIAGNOSIS — I4819 Other persistent atrial fibrillation: Secondary | ICD-10-CM | POA: Diagnosis not present

## 2024-04-19 DIAGNOSIS — I110 Hypertensive heart disease with heart failure: Secondary | ICD-10-CM | POA: Insufficient documentation

## 2024-04-19 DIAGNOSIS — I081 Rheumatic disorders of both mitral and tricuspid valves: Secondary | ICD-10-CM | POA: Insufficient documentation

## 2024-04-19 DIAGNOSIS — K219 Gastro-esophageal reflux disease without esophagitis: Secondary | ICD-10-CM | POA: Insufficient documentation

## 2024-04-19 DIAGNOSIS — E119 Type 2 diabetes mellitus without complications: Secondary | ICD-10-CM | POA: Insufficient documentation

## 2024-04-19 DIAGNOSIS — Z9079 Acquired absence of other genital organ(s): Secondary | ICD-10-CM | POA: Insufficient documentation

## 2024-04-19 DIAGNOSIS — Z800 Family history of malignant neoplasm of digestive organs: Secondary | ICD-10-CM | POA: Insufficient documentation

## 2024-04-19 DIAGNOSIS — Z7901 Long term (current) use of anticoagulants: Secondary | ICD-10-CM | POA: Insufficient documentation

## 2024-04-19 DIAGNOSIS — B977 Papillomavirus as the cause of diseases classified elsewhere: Secondary | ICD-10-CM | POA: Insufficient documentation

## 2024-04-19 DIAGNOSIS — Z79899 Other long term (current) drug therapy: Secondary | ICD-10-CM | POA: Insufficient documentation

## 2024-04-19 MED ORDER — SENNOSIDES-DOCUSATE SODIUM 8.6-50 MG PO TABS: 2.0000 | ORAL_TABLET | Freq: Every day | ORAL | 1 refills | Status: AC

## 2024-04-19 NOTE — Patient Instructions (Addendum)
 SURGICAL WAITING ROOM VISITATION Patients having surgery or a procedure may have no more than 2 support people in the waiting area - these visitors may rotate.    Children under the age of 87 will not be allowed to visit due to the increase in respiratory illness  Children under the age of 19 must have an adult with them who is not the patient.  If the patient needs to stay at the hospital during part of their recovery, the visitor guidelines for inpatient rooms apply. Pre-op nurse will coordinate an appropriate time for 1 support person to accompany patient in pre-op.  This support person may not rotate.    Please refer to the [facility] website for the visitor guidelines for Inpatients (after your surgery is over and you are in a regular room).       Your procedure is scheduled on: 04-26-24   Report to [facility] Main Entrance    Report to admitting at 12:00 PM (noon)   Call this number if you have problems the morning of surgery [phone]   Do not eat food :After Midnight.   After Midnight you may have the following liquids until 11:00 AM DAY OF SURGERY  Water  Non-Citrus Juices (without pulp, NO RED-Apple, White grape, White cranberry) Black Coffee (NO MILK/CREAM OR CREAMERS, sugar ok)  Clear Tea (NO MILK/CREAM OR CREAMERS, sugar ok) regular and decaf                             Plain Jell-O (NO RED)                                           Fruit ices (not with fruit pulp, NO RED)                                     Popsicles (NO RED)                                                               Sports drinks like Gatorade (NO RED)                       If you have questions, please contact your surgeons office.   FOLLOW  ANY ADDITIONAL PRE OP INSTRUCTIONS YOU RECEIVED FROM YOUR SURGEON'S OFFICE!!!     Oral Hygiene is also important to reduce your risk of infection.                                    Remember - BRUSH YOUR TEETH THE MORNING OF SURGERY WITH  YOUR REGULAR TOOTHPASTE   Do NOT smoke after Midnight   Take these medicines the morning of surgery with A SIP OF WATER :    Metoprolol  (Toprol )   Pantoprazole  (Protonix )   Oxycodone  if needed  Stop all vitamins and herbal supplements 7 days before surgery  Hold Xarelto  2 days before surgery (do not take after 04-22-24)  How to  Manage Your Diabetes Before and After Surgery  Why is it important to control my blood sugar before and after surgery? Improving blood sugar levels before and after surgery helps healing and can limit problems. A way of improving blood sugar control is eating a healthy diet by:  Eating less sugar and carbohydrates  Increasing activity/exercise  Talking with your doctor about reaching your blood sugar goals High blood sugars (greater than 180 mg/dL) can raise your risk of infections and slow your recovery, so you will need to focus on controlling your diabetes during the weeks before surgery. Make sure that the doctor who takes care of your diabetes knows about your planned surgery including the date and location.  How do I manage my blood sugar before surgery? Check your blood sugar at least 4 times a day, starting 2 days before surgery, to make sure that the level is not too high or low. Check your blood sugar the morning of your surgery when you wake up and every 2 hours until you get to the Short Stay unit. If your blood sugar is less than 70 mg/dL, you will need to treat for low blood sugar: Do not take insulin . Treat a low blood sugar (less than 70 mg/dL) with  cup of clear juice (cranberry or apple), 4 glucose tablets, OR glucose gel. Recheck blood sugar in 15 minutes after treatment (to make sure it is greater than 70 mg/dL). If your blood sugar is not greater than 70 mg/dL on recheck, call [phone] for further instructions. Report your blood sugar to the short stay nurse when you get to Short Stay.  If you are admitted to the hospital after  surgery: Your blood sugar will be checked by the staff and you will probably be given insulin  after surgery (instead of oral diabetes medicines) to make sure you have good blood sugar levels. The goal for blood sugar control after surgery is 80-180 mg/dL.   WHAT DO I DO ABOUT MY DIABETES MEDICATION?  Do not take oral diabetes medicines (pills) the morning of surgery.  Hold Farxiga  3 days before surgery (do not take after 04-21-24)        Hold Semaglutide  (Rybelsus  24 hours prior to surgery)  THE NIGHT BEFORE SURGERY:  do not take an evening dose of Glipizide .      Reviewed and Endorsed by [facility] Patient Education Committee, August 2015  Bring CPAP mask and tubing day of surgery.                              You may not have any metal on your body including hair pins, jewelry, and body piercing             Do not wear make-up, lotions, powders, perfumes or deodorant  Do not wear nail polish including gel and S&S, artificial/acrylic nails, or any other type of covering on natural nails including finger and toenails. If you have artificial nails, gel coating, etc. that needs to be removed by a nail salon please have this removed prior to surgery or surgery may need to be canceled/ delayed if the surgeon/ anesthesia feels like they are unable to be safely monitored.   Do not shave  48 hours prior to surgery.          Do not bring valuables to the hospital. [facility] IS NOT RESPONSIBLE   FOR VALUABLES.   Contacts, dentures or bridgework may not  be worn into surgery.   DO NOT BRING YOUR HOME MEDICATIONS TO THE HOSPITAL. PHARMACY WILL DISPENSE MEDICATIONS LISTED ON YOUR MEDICATION LIST TO YOU DURING YOUR ADMISSION IN THE HOSPITAL!    Patients discharged on the day of surgery will not be allowed to drive home.  Someone NEEDS to stay with you for the first 24 hours after anesthesia.   Special Instructions: Bring a copy of your healthcare power of attorney and living will documents  the day of surgery if you haven't scanned them before.              Please read over the following fact sheets you were given: IF YOU HAVE QUESTIONS ABOUT YOUR PRE-OP INSTRUCTIONS PLEASE CALL [phone] [facility] or [phone]  If you received a COVID test during your pre-op visit  it is requested that you wear a mask when out in public, stay away from anyone that may not be feeling well and notify your surgeon if you develop symptoms. If you test positive for Covid or have been in contact with anyone that has tested positive in the last 10 days please notify you surgeon.  [facility] - Preparing for Surgery Before surgery, you can play an important role.  Because skin is not sterile, your skin needs to be as free of germs as possible.  You can reduce the number of germs on your skin by washing with CHG (chlorahexidine gluconate) soap before surgery.  CHG is an antiseptic cleaner which kills germs and bonds with the skin to continue killing germs even after washing. Please DO NOT use if you have an allergy to CHG or antibacterial soaps.  If your skin becomes reddened/irritated stop using the CHG and inform your nurse when you arrive at Short Stay. Do not shave (including legs and underarms) for at least 48 hours prior to the first CHG shower.  You may shave your face/neck.  Please follow these instructions carefully:  1.  Shower with CHG Soap the night before surgery ONLY (DO NOT USE THE SOAP THE MORNING OF SURGERY).  2.  If you choose to wash your hair, wash your hair first as usual with your normal  shampoo.  3.  After you shampoo, rinse your hair and body thoroughly to remove the shampoo.                             4.  Use CHG as you would any other liquid soap.  You can apply chg directly to the skin and wash.  Gently with a scrungie or clean washcloth.  5.  Apply the CHG Soap to your body ONLY FROM THE NECK DOWN.   Do   not use on face/ open                           Wound or open sores. Avoid  contact with eyes, ears mouth and   genitals (private parts).                       Wash face,  Genitals (private parts) with your normal soap.             6.  Wash thoroughly, paying special attention to the area where your    surgery  will be performed.  7.  Thoroughly rinse your body with warm water  from the neck down.  8.  DO NOT  shower/wash with your normal soap after using and rinsing off the CHG Soap.                9.  Pat yourself dry with a clean towel.            10.  Wear clean pajamas.            11.  Place clean sheets on your bed the night of your first shower and do not  sleep with pets. Day of Surgery :  Shower with regular soap Do not apply any CHG, lotions/deodorants the morning of surgery.  Please wear clean clothes to the hospital/surgery center.  FAILURE TO FOLLOW THESE INSTRUCTIONS MAY RESULT IN THE CANCELLATION OF YOUR SURGERY  PATIENT SIGNATURE_________________________________  NURSE SIGNATURE__________________________________  ________________________________________________________________________  WHAT IS A BLOOD TRANSFUSION? Blood Transfusion Information  A transfusion is the replacement of blood or some of its parts. Blood is made up of multiple cells which provide different functions. Red blood cells carry oxygen  and are used for blood loss replacement. White blood cells fight against infection. Platelets control bleeding. Plasma helps clot blood. Other blood products are available for specialized needs, such as hemophilia or other clotting disorders. BEFORE THE TRANSFUSION  Who gives blood for transfusions?  Healthy volunteers who are fully evaluated to make sure their blood is safe. This is blood bank blood. Transfusion therapy is the safest it has ever been in the practice of medicine. Before blood is taken from a donor, a complete history is taken to make sure that person has no history of diseases nor engages in risky social behavior (examples are  intravenous drug use or sexual activity with multiple partners). The donor's travel history is screened to minimize risk of transmitting infections, such as malaria. The donated blood is tested for signs of infectious diseases, such as HIV and hepatitis. The blood is then tested to be sure it is compatible with you in order to minimize the chance of a transfusion reaction. If you or a relative donates blood, this is often done in anticipation of surgery and is not appropriate for emergency situations. It takes many days to process the donated blood. RISKS AND COMPLICATIONS Although transfusion therapy is very safe and saves many lives, the main dangers of transfusion include:  Getting an infectious disease. Developing a transfusion reaction. This is an allergic reaction to something in the blood you were given. Every precaution is taken to prevent this. The decision to have a blood transfusion has been considered carefully by your caregiver before blood is given. Blood is not given unless the benefits outweigh the risks. AFTER THE TRANSFUSION Right after receiving a blood transfusion, you will usually feel much better and more energetic. This is especially true if your red blood cells have gotten low (anemic). The transfusion raises the level of the red blood cells which carry oxygen , and this usually causes an energy increase. The nurse administering the transfusion will monitor you carefully for complications. HOME CARE INSTRUCTIONS  No special instructions are needed after a transfusion. You may find your energy is better. Speak with your caregiver about any limitations on activity for underlying diseases you may have. SEEK MEDICAL CARE IF:  Your condition is not improving after your transfusion. You develop redness or irritation at the intravenous (IV) site. SEEK IMMEDIATE MEDICAL CARE IF:  Any of the following symptoms occur over the next 12 hours: Shaking chills. You have a temperature by  mouth above 102 F (38.9 C),  not controlled by medicine. Chest, back, or muscle pain. People around you feel you are not acting correctly or are confused. Shortness of breath or difficulty breathing. Dizziness and fainting. You get a rash or develop hives. You have a decrease in urine output. Your urine turns a dark color or changes to pink, red, or brown. Any of the following symptoms occur over the next 10 days: You have a temperature by mouth above 102 F (38.9 C), not controlled by medicine. Shortness of breath. Weakness after normal activity. The white part of the eye turns yellow (jaundice). You have a decrease in the amount of urine or are urinating less often. Your urine turns a dark color or changes to pink, red, or brown. Document Released: 03/05/2000 Document Revised: 05/31/2011 Document Reviewed: 10/23/2007 [facility] Patient Information 2014 [place], [place].  _______________________________________________________________________

## 2024-04-19 NOTE — Progress Notes (Signed)
 Patient here for a consult with Dr. Viktoria and for a pre-operative appointment prior to her scheduled surgery on 04/26/2024. She is scheduled for a pelvic exam under anesthesia, partial radical vulvectomy including removal of the clitoris, possible bilateral sentinel lymph node biopsy, right groin lymph node dissection. The surgery was discussed in detail.  See after visit summary for additional details.       Discussed post-op pain management in detail including the aspects of the enhanced recovery pathway.  Advised her that a new prescription would be sent in closer to surgery for Oxycodone  and it is only to be used for after her upcoming surgery.  We discussed the use of tylenol  post-op and to monitor for a maximum of 4,000 mg in a 24 hour period.  Also let her now that sennakot will be prescribed to be used after surgery and to hold if having loose stools.  Discussed bowel regimen in detail.    Went over the JP drain, how to empty and how to record the drainage.   Discussed the use of SCDs and measures to take at home to prevent DVT including frequent mobility.  Reportable signs and symptoms of DVT discussed. Post-operative instructions discussed and expectations for after surgery. Incisional care discussed as well including reportable signs and symptoms including erythema, drainage, wound separation.     30 minutes spent with the patient.  Verbalizing understanding of material discussed. No needs or concerns voiced at the end of the visit.   Advised patient to call for any needs.  Advised that her post-operative medications had been prescribed and could be picked up at any time.    This appointment is included in the global surgical bundle as pre-operative teaching and has no charge.

## 2024-04-19 NOTE — Telephone Encounter (Signed)
 Received cardio clearance

## 2024-04-19 NOTE — Patient Instructions (Addendum)
 Preparing for your Surgery  Plan for surgery on 04/26/2024 with Dr. Comer Dollar at [facility]. You will be scheduled for pelvic exam under anesthesia, partial radical vulvectomy including removal of the clitoris, possible bilateral sentinel lymph node biopsy, right groin lymph node dissection.  Plan for overnight stay in the hospital and if doing well the next am, you will be discharged home.    Pre-operative Testing -You will receive a phone call from presurgical testing at [facility] to arrange for a pre-operative appointment and lab work.  -Bring your insurance card, copy of an advanced directive if applicable, medication list  -At that visit, you will be asked to sign a consent for a possible blood transfusion in case a transfusion becomes necessary during surgery.  The need for a blood transfusion is rare but having consent is a necessary part of your care.     -YOU WILL NEED TO HOLD XARELTO  2 DAYS BEFORE SURGERY.  -YOU WILL NEED TO HOLD FARXIGA  3 DAYS BEFORE SURGERY AND SEMAGLUTIDE  FOR 7 DAYS BEFORE SURGERY.  -Do not take supplements such as fish oil (omega 3), red yeast rice, turmeric before your surgery. STOP TAKING AT LEAST 10 DAYS BEFORE SURGERY. You want to avoid medications with aspirin  in them including headache powders such as BC or Goody's), Excedrin migraine.  -If you are taking a GLP-1 medication/injection such as Ozempic, Mounjaro, [id number], this needs to be held before surgery for at least 7 days before.  Day Before Surgery at Home - You will be advised you can have clear liquids up until 3 hours before your surgery.    Your role in recovery Your role is to become active as soon as directed by your doctor, while still giving yourself time to heal.  Rest when you feel tired. You will be asked to do the following in order to speed your recovery:  - Cough and breathe deeply. This helps to clear and expand your lungs and can prevent pneumonia after  surgery.  - STAY ACTIVE WHEN YOU GET HOME. Do mild physical activity. Walking or moving your legs help your circulation and body functions return to normal. Do not try to get up or walk alone the first time after surgery.   -If you develop swelling on one leg or the other, pain in the back of your leg, redness/warmth in one of your legs, please call the office or go to the Emergency Room to have a doppler to rule out a blood clot. For shortness of breath, chest pain-seek care in the Emergency Room as soon as possible. - Actively manage your pain. Managing your pain lets you move in comfort. We will ask you to rate your pain on a scale of zero to 10. It is your responsibility to tell your doctor or nurse where and how much you hurt so your pain can be treated.  Special Considerations -If you are diabetic, you may be placed on insulin  after surgery to have closer control over your blood sugars to promote healing and recovery.  This does not mean that you will be discharged on insulin .  If applicable, your oral antidiabetics will be resumed when you are tolerating a solid diet.  -Your final pathology results from surgery should be available around one week after surgery and the results will be relayed to you when available.  -FMLA forms can be faxed to [phone] and please allow 5-7 business days for completion.  Pain Management After Surgery -You will be  prescribed your pain medication (right before surgery since recently filled oxy) and bowel regimen medications before surgery so that you can have these available when you are discharged from the hospital. The pain medication is for use ONLY AFTER surgery and a new prescription will not be given.   -Make sure that you have Tylenol  IF YOU ARE ABLE TO TAKE THESE MEDICATION at home to use on a regular basis after surgery for pain control.   -Review the attached handout on narcotic use and their risks and side effects.   Bowel Regimen -You will be  prescribed Sennakot-S to take nightly to prevent constipation especially if you are taking the narcotic pain medication intermittently.  It is important to prevent constipation and drink adequate amounts of liquids. You can stop taking this medication when you are not taking pain medication and you are back on your normal bowel routine.  Risks of Surgery Risks of surgery are low but include bleeding, infection, damage to surrounding structures, re-operation, blood clots, and very rarely death.   Blood Transfusion Information (For the consent to be signed before surgery)  We will be checking your blood type before surgery so in case of emergencies, we will know what type of blood you would need.                                            WHAT IS A BLOOD TRANSFUSION?  A transfusion is the replacement of blood or some of its parts. Blood is made up of multiple cells which provide different functions. Red blood cells carry oxygen  and are used for blood loss replacement. White blood cells fight against infection. Platelets control bleeding. Plasma helps clot blood. Other blood products are available for specialized needs, such as hemophilia or other clotting disorders. BEFORE THE TRANSFUSION  Who gives blood for transfusions?  You may be able to donate blood to be used at a later date on yourself (autologous donation). Relatives can be asked to donate blood. This is generally not any safer than if you have received blood from a stranger. The same precautions are taken to ensure safety when a relative's blood is donated. Healthy volunteers who are fully evaluated to make sure their blood is safe. This is blood bank blood. Transfusion therapy is the safest it has ever been in the practice of medicine. Before blood is taken from a donor, a complete history is taken to make sure that person has no history of diseases nor engages in risky social behavior (examples are intravenous drug use or sexual  activity with multiple partners). The donor's travel history is screened to minimize risk of transmitting infections, such as malaria. The donated blood is tested for signs of infectious diseases, such as HIV and hepatitis. The blood is then tested to be sure it is compatible with you in order to minimize the chance of a transfusion reaction. If you or a relative donates blood, this is often done in anticipation of surgery and is not appropriate for emergency situations. It takes many days to process the donated blood. RISKS AND COMPLICATIONS Although transfusion therapy is very safe and saves many lives, the main dangers of transfusion include:  Getting an infectious disease. Developing a transfusion reaction. This is an allergic reaction to something in the blood you were given. Every precaution is taken to prevent this. The decision to have a  blood transfusion has been considered carefully by your caregiver before blood is given. Blood is not given unless the benefits outweigh the risks.  AFTER SURGERY INSTRUCTIONS  Return to work: 4-6 weeks if applicable  You will be discharged home with a drain in place coming from the right groin. You will need to empty this drain several times a day and keep a record of the output.  Activity: 1. Be up and out of the bed during the day.  Take a nap if needed.  You may walk up steps but be careful and use the hand rail.  Stair climbing will tire you more than you think, you may need to stop part way and rest.   2. No lifting or straining for 6 weeks over 10 pounds. No pushing, pulling, straining for 6 weeks.  3. No driving for 4-89 days when the following criteria have been met: Do not drive if you are taking narcotic pain medicine and make sure that your reaction time has returned.   4. You can shower as soon as the next day after surgery. Shower daily.  Use your regular soap and water (not directly on the incision) and pat your incision(s) dry afterwards;  don't rub.  No tub baths or submerging your body in water until cleared by your surgeon. If you have the soap that was given to you by pre-surgical testing that was used before surgery, you do not need to use it afterwards because this can irritate your incisions.   5. No sexual activity and nothing in the vagina for 6 weeks.  6. You may experience a small amount of clear drainage from your incisions, which is normal.  If the drainage persists, increases, or changes color please call the office.  7. Do not use creams, lotions, or ointments such as neosporin on your incisions after surgery until advised by your surgeon because they can cause removal of the dermabond glue on your incisions.    8. You may experience vulvar spotting after surgery.  The spotting is normal but if you experience heavy bleeding, call our office.  9. Take Tylenol  first for pain if you are able to take these medication and only use narcotic pain medication for severe pain not relieved by the Tylenol .  Monitor your Tylenol  intake to a max of 4,000 mg in a 24 hour period.   Diet: 1. Low sodium Heart Healthy Diet is recommended but you are cleared to resume your normal (before surgery) diet after your procedure.  2. It is safe to use a laxative, such as Miralax  or Colace, if you have difficulty moving your bowels before surgery. You have been prescribed Sennakot-S to take at bedtime every evening after surgery to keep bowel movements regular and to prevent constipation.    Wound Care: 1. Keep clean and dry.  Shower daily.  Reasons to call the Doctor: Fever - Oral temperature greater than 100.4 degrees Fahrenheit Foul-smelling vaginal discharge Difficulty urinating Nausea and vomiting Increased pain at the site of the incision that is unrelieved with pain medicine. Difficulty breathing with or without chest pain New calf pain especially if only on one side Sudden, continuing increased vaginal bleeding with or without  clots.   Contacts: For questions or concerns you should contact:  Dr. Comer Dollar at [phone]  Eleanor Epps, NP at [phone]  After Hours: call [phone] and have the GYN Oncologist paged/contacted (after 5 pm or on the weekends). You will speak with an after hours RN  and let he or she know you have had surgery.  Messages sent via mychart are for non-urgent matters and are not responded to after hours so for urgent needs, please call the after hours number.

## 2024-04-19 NOTE — Progress Notes (Addendum)
 Date of COVID positive in last 90 days:  Had flu like symptoms 03-27-24, all symptoms resolved.  PCP - Mabel Pry, DP Cardiologist - Lurena Red, MD  Cardiac clearance in Epic dated 04-18-24  Chest x-ray - N/A EKG - 03-26-24 Epic Stress Test - 10-29-14 Epic ECHO - 01-17-24 Epic Cardiac Cath - 01-18-19 Epic Pacemaker/ICD device last checked:N/A Spinal Cord Stimulator:N/A  Bowel Prep - N/A  Sleep Study - Yes, +sleep apnea CPAP - Yes  Fasting Blood Sugar - 120 range Checks Blood Sugar - 2 to 3 times a day  Rybelsus  qd Last dose of GLP1 agonist-  N/A GLP1 instructions:  Hold 24 hours prior to surgery      Farxiga  Last dose of SGLT-2 inhibitors-  N/A SGLT-2 instructions:  Do not take after  04-21-24  Blood Thinner Instructions: Jeralyn (hold 2 days prior to surgery) Patient is aware : Aspirin  Instructions:N/A Last Dose:  Activity level:  Can go up a flight of stairs and perform activities of daily living without stopping and without symptoms of chest pain.  Has shortness of breath at times with exertion at times.  Anesthesia review: CHF, Afib, HTN, OSA  Patient denies shortness of breath, fever, cough and chest pain at PAT appointment  Patient verbalized understanding of instructions that were given to them at the PAT appointment. Patient was also instructed that they will need to review over the PAT instructions again at home before surgery.

## 2024-04-20 ENCOUNTER — Telehealth: Payer: Self-pay | Admitting: *Deleted

## 2024-04-20 ENCOUNTER — Encounter (HOSPITAL_COMMUNITY)
Admission: RE | Admit: 2024-04-20 | Discharge: 2024-04-20 | Disposition: A | Source: Ambulatory Visit | Attending: Gynecologic Oncology

## 2024-04-20 ENCOUNTER — Encounter (HOSPITAL_COMMUNITY): Payer: Self-pay

## 2024-04-20 ENCOUNTER — Ambulatory Visit: Payer: Self-pay | Admitting: Gynecologic Oncology

## 2024-04-20 ENCOUNTER — Other Ambulatory Visit: Payer: Self-pay | Admitting: Gynecologic Oncology

## 2024-04-20 ENCOUNTER — Other Ambulatory Visit: Payer: Self-pay

## 2024-04-20 ENCOUNTER — Telehealth: Payer: Self-pay | Admitting: Family Medicine

## 2024-04-20 VITALS — BP 140/74 | HR 69 | Temp 98.0°F | Resp 16 | Ht 61.0 in | Wt 187.0 lb

## 2024-04-20 DIAGNOSIS — Z7901 Long term (current) use of anticoagulants: Secondary | ICD-10-CM | POA: Insufficient documentation

## 2024-04-20 DIAGNOSIS — C519 Malignant neoplasm of vulva, unspecified: Secondary | ICD-10-CM | POA: Diagnosis not present

## 2024-04-20 DIAGNOSIS — D582 Other hemoglobinopathies: Secondary | ICD-10-CM

## 2024-04-20 DIAGNOSIS — Z7902 Long term (current) use of antithrombotics/antiplatelets: Secondary | ICD-10-CM | POA: Diagnosis not present

## 2024-04-20 DIAGNOSIS — I4821 Permanent atrial fibrillation: Secondary | ICD-10-CM | POA: Diagnosis not present

## 2024-04-20 DIAGNOSIS — N183 Chronic kidney disease, stage 3 unspecified: Secondary | ICD-10-CM | POA: Insufficient documentation

## 2024-04-20 DIAGNOSIS — Z7984 Long term (current) use of oral hypoglycemic drugs: Secondary | ICD-10-CM | POA: Diagnosis not present

## 2024-04-20 DIAGNOSIS — I340 Nonrheumatic mitral (valve) insufficiency: Secondary | ICD-10-CM | POA: Insufficient documentation

## 2024-04-20 DIAGNOSIS — I5032 Chronic diastolic (congestive) heart failure: Secondary | ICD-10-CM | POA: Diagnosis not present

## 2024-04-20 DIAGNOSIS — E119 Type 2 diabetes mellitus without complications: Secondary | ICD-10-CM

## 2024-04-20 DIAGNOSIS — Z01812 Encounter for preprocedural laboratory examination: Secondary | ICD-10-CM | POA: Diagnosis present

## 2024-04-20 DIAGNOSIS — I428 Other cardiomyopathies: Secondary | ICD-10-CM | POA: Insufficient documentation

## 2024-04-20 DIAGNOSIS — I130 Hypertensive heart and chronic kidney disease with heart failure and stage 1 through stage 4 chronic kidney disease, or unspecified chronic kidney disease: Secondary | ICD-10-CM | POA: Diagnosis not present

## 2024-04-20 DIAGNOSIS — E1122 Type 2 diabetes mellitus with diabetic chronic kidney disease: Secondary | ICD-10-CM | POA: Diagnosis not present

## 2024-04-20 DIAGNOSIS — G4733 Obstructive sleep apnea (adult) (pediatric): Secondary | ICD-10-CM | POA: Diagnosis not present

## 2024-04-20 LAB — CBC
HCT: 46.2 % — ABNORMAL HIGH (ref 36.0–46.0)
Hemoglobin: 14.5 g/dL (ref 12.0–15.0)
MCH: 30.9 pg (ref 26.0–34.0)
MCHC: 31.4 g/dL (ref 30.0–36.0)
MCV: 98.3 fL (ref 80.0–100.0)
Platelets: 271 10*3/uL (ref 150–400)
RBC: 4.7 MIL/uL (ref 3.87–5.11)
RDW: 13.9 % (ref 11.5–15.5)
WBC: 9.4 10*3/uL (ref 4.0–10.5)
nRBC: 0 % (ref 0.0–0.2)

## 2024-04-20 LAB — BASIC METABOLIC PANEL WITH GFR
Anion gap: 10 (ref 5–15)
BUN: 18 mg/dL (ref 8–23)
CO2: 29 mmol/L (ref 22–32)
Calcium: 10 mg/dL (ref 8.9–10.3)
Chloride: 103 mmol/L (ref 98–111)
Creatinine, Ser: 1.16 mg/dL — ABNORMAL HIGH (ref 0.44–1.00)
GFR, Estimated: 47 mL/min — ABNORMAL LOW
Glucose, Bld: 100 mg/dL — ABNORMAL HIGH (ref 70–99)
Potassium: 3.7 mmol/L (ref 3.5–5.1)
Sodium: 143 mmol/L (ref 135–145)

## 2024-04-20 LAB — GLUCOSE, CAPILLARY: Glucose-Capillary: 146 mg/dL — ABNORMAL HIGH (ref 70–99)

## 2024-04-20 LAB — HEMOGLOBIN A1C
Hgb A1c MFr Bld: 6.4 % — ABNORMAL HIGH (ref 4.8–5.6)
Mean Plasma Glucose: 136.98 mg/dL

## 2024-04-20 NOTE — Telephone Encounter (Signed)
 Copied from CRM [id number]. Topic: General - Other >> Apr 20, 2024  9:42 AM Terri MATSU wrote: Reason for CRM: Rosaline from GYN stated they faxed over some paperwork for her surgery clearance that's scheduled for next Wednesday and wanted to know if we received it. Verify fax and it was correct.

## 2024-04-20 NOTE — Telephone Encounter (Signed)
 Received PCP clearance.

## 2024-04-20 NOTE — Telephone Encounter (Signed)
 Spoke with Holly Smith who is aware that she will have repeat labs today at her pre-admission appointment to recheck her hemoglobin that was elevated on 1/6.   Patient states she is staying with her son in [place] and is requesting to move her Pet Scan to [employer].   Patient's Pet scan has been moved to Tuesday, February 3 rd. At 1:30 pm. Pt is arrive to have nothing to eat or drink after 0730 and to arrive by 1pm. Pt thanked the office for calling.

## 2024-04-20 NOTE — Telephone Encounter (Signed)
 Haven't seen anything yet.

## 2024-04-20 NOTE — Telephone Encounter (Addendum)
 Spoke with patient and relayed results of CBC and advised patient that Holly Epps, NP consulted with Dr. Lonn the  hematologist and since labs are better no further recommendations are needed. Pt verbalized understanding and thanked the office for calling back. Pt was also reminded of her Pet Scan appt. On Tuesday with instructions and the office will call on Wednesday for her pre-op call.

## 2024-04-20 NOTE — Telephone Encounter (Signed)
 Spoke with patient and relayed message from provider that recommendations are to hold faxiga 4 days prior to her procedure with Dr. Viktoria instead of 3 days. Last dose of faxiga will be tomorrow, Saturday, January 31st.  Pt verbalized understanding. Also advised patient if her lab results from CBC are still elevated the office would call back with new recommendations. Pt thanked the office for calling.

## 2024-04-23 ENCOUNTER — Ambulatory Visit

## 2024-04-23 NOTE — Progress Notes (Signed)
 " Case: [id number] Date/Time: 04/26/24 1358   Procedures:      LYMPH NODE BIOPSY (Bilateral) - SENTINEL LYMPH NODE BIOPSY     VULVECTOMY, WITH LYMPHADENECTOMY (Bilateral)   Anesthesia type: General   Pre-op diagnosis: RECURRENT VULVAR CANCER   Location: [facility] ROOM 05 / [facility]   Surgeons: Viktoria Comer SAUNDERS, MD       DISCUSSION: Holly Smith is an 79 yo female with PMH of HTN, permanent A-fib on Xarelto , CAD s/p DES x2 to LAD (12/2018), HFpEF, NICM with recovered EF, mitral regurgitation, OSA on CPAP, GERD, T2DM (A1c 6.4), CKD 3, arthritis, vulvar cancer, PONV  Patient follows with Cardiology for above hx. Last seen by Dr. Wendel on 12/13/2023.  She denies any cardiac symptoms and was stable.  She was advised to restart irbesartan  and echocardiogram was ordered which was done in 12/2023 and showed normal LVEF 60 to 65%, severely dilated LA, mildly dilated RA, mild MR.  Cardiac clearance televisit done on 04/10/2024.  Cleared for surgery:  Preoperative cardiovascular examination Holly Smith's perioperative risk of a major cardiac event is 0.9% according to the Revised Cardiac Risk Index (RCRI).  Therefore, she is at low risk for perioperative complications.   Her functional capacity is fair at 4.06 METs according to the Duke Activity Status Index (DASI). Recommendations: According to ACC/AHA guidelines, no further cardiovascular testing needed.  The patient may proceed to surgery at acceptable risk.   Antiplatelet and/or Anticoagulation Recommendations: Xarelto  (Rivaroxaban ) can be held for 2 days prior to surgery.  Please resume post op when felt to be safe.   VS: BP (!) 140/74   Pulse 69   Temp 36.7 C (Oral)   Resp 16   Ht 5' 1 (1.549 m)   Wt 84.8 kg   SpO2 96%   BMI 35.33 kg/m   PROVIDERS: Frann Mabel Mt, DO   LABS: Labs reviewed: Acceptable for surgery. (all labs ordered are listed, but only abnormal results are displayed)  Labs Reviewed  GLUCOSE, CAPILLARY -  Abnormal; Notable for the following components:      Result Value   Glucose-Capillary 146 (*)    All other components within normal limits  CBC - Abnormal; Notable for the following components:   HCT 46.2 (*)    All other components within normal limits  BASIC METABOLIC PANEL WITH GFR - Abnormal; Notable for the following components:   Glucose, Bld 100 (*)    Creatinine, Ser 1.16 (*)    GFR, Estimated 47 (*)    All other components within normal limits  HEMOGLOBIN A1C - Abnormal; Notable for the following components:   Hgb A1c MFr Bld 6.4 (*)    All other components within normal limits  TYPE AND SCREEN     EKG 03/26/2024:  Atrial fibrillation Incomplete right bundle branch block Nonspecific ST and T wave abnormality   Echo 01/17/2024:  IMPRESSIONS    1. Left ventricular ejection fraction, by estimation, is 60 to 65%. Left ventricular ejection fraction by 3D volume is 63 %. The left ventricle has normal function. The left ventricle has no regional wall motion abnormalities. Left ventricular diastolic  function could not be evaluated.  2. Right ventricular systolic function is normal. The right ventricular size is normal. There is normal pulmonary artery systolic pressure.  3. Left atrial size was severely dilated.  4. Right atrial size was mildly dilated.  5. The mitral valve is normal in structure. Mild mitral valve regurgitation. No evidence of mitral stenosis.  6. The aortic valve is tricuspid. Aortic valve regurgitation is not visualized. No aortic stenosis is present.  7. The inferior vena cava is normal in size with greater than 50% respiratory variability, suggesting right atrial pressure of 3 mmHg.  Left heart cath 01/18/2019:   Prox Cx lesion is 50% stenosed. More distal mid LAD-1 lesion is 80% stenosed. A drug-eluting stent was successfully placed using a STENT SYNERGY DES 2.25X28. Post intervention, there is a 0% residual stenosis. More proximal mid  LAD-2 lesion is 80% stenosed. A drug-eluting stent was successfully placed using a STENT SYNERGY DES 2.75X20 postdilated to 3.0 mm. Post intervention, there is a 0% residual stenosis. Mid RCA lesion is 25% stenosed. Acute Mrg lesion is 75% stenosed. This is a small vessel. RPDA lesion is 70% stenosed. This is a small vessel. The left ventricular systolic function is normal. LV end diastolic pressure is moderately elevated. LVEDP 26 mm Hg. The left ventricular ejection fraction is 55-65% by visual estimate. There is no aortic valve stenosis.   Restart Xarelto  tomorrow.  Continue Plavix  75 mg daily in addition to Xarelto .  To reduce bleeding risk, will hold off on continuing aspirin .     She has a fair amount of volume overload as well.  Will need to increase diuretics at least temporarily after she recovers from the cath.  This may help her breathing.    Continue aggressive secondary prevention.     Past Medical History:  Diagnosis Date   Abnormal Pap smear of vagina and vaginal HPV    Anticoagulation adequate 08/02/2014   Overview:  Xarelto  20hs; CHADS=1+ htn, female, age  Overview:  Xarelto  20hs; CHADS=1+ htn, female, age   Arthritis    bones ache (01/23/2015)   Atrial fibrillation, persistent (HCC) 08/13/2015   Overview:  symptomatic (fatigue, DOE, decreased exercise tolerance, palpitations), drug refractory (propafenone, amiodarone), complicated recovery from DCCV possibly related to repiratory obstruction [facility] (patient fear of DCCV)  Overview:  symptomatic (fatigue, DOE, decreased exercise tolerance, palpitations), drug refractory (propafenone, amiodarone), complicated recovery from DCCV possibly relat   Benign neoplasm of ascending colon    Benign neoplasm of descending colon    Benign neoplasm of sigmoid colon    Breath shortness 08/20/2013   CHF (congestive heart failure) (HCC)    Chronic atrial fibrillation (HCC)    Chronic combined systolic and diastolic CHF, NYHA class 3  (HCC) 01/28/2015   Essential hypertension 10/07/2014   Trial off acei 10/07/2014  Due to atypical sob (talking = walking)> no real change 11/18/2014     Family history of adverse reaction to anesthesia    sister gets very nauseated and vomits   GERD (gastroesophageal reflux disease)    History of kidney stones    HPV (human papilloma virus) anogenital infection    03/21/12 PAP + HR HPV   Mitral regurgitation 04/23/2013   Overview:  Moderate to severe by TTE, mild to mod by TEE  Overview:  Moderate to severe by TTE, mild to mod by TEE   Obesity    OSA (obstructive sleep apnea) 11/27/2014   07/2013 RDI 17/h CPAP 8    Other specified diseases of esophagus    Postoperative nausea and vomiting 1983   many years ago with hysterectomy   PVC (premature ventricular contraction) 12/16/2014   TI (tricuspid incompetence) 04/23/2013   Overview:  moderate by TTE    Urolithiasis    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 History of genital warts  and vulvar condyloma    Past Surgical History:  Procedure Laterality Date   ABDOMINAL HYSTERECTOMY     APPENDECTOMY     BILATERAL SALPINGOOPHORECTOMY  2003   benign ovarian cancer   CARDIOVERSION  2014-2015   [facility]   COLONOSCOPY WITH PROPOFOL  N/A 06/10/2015   Procedure: COLONOSCOPY WITH PROPOFOL ;  Surgeon: Rogelia Copping, MD;  Location: [facility] ENDOSCOPY;  Service: Endoscopy;  Laterality: N/A;   CORONARY STENT INTERVENTION N/A 01/18/2019   Procedure: CORONARY STENT INTERVENTION;  Surgeon: Dann Candyce RAMAN, MD;  Location: [facility] INVASIVE CV LAB;  Service: Cardiovascular;  Laterality: N/A;   CYSTOSCOPY W/ RETROGRADES Bilateral 10/19/2016   Procedure: CYSTOSCOPY WITH RETROGRADE PYELOGRAM;  Surgeon: Penne Knee, MD;  Location: [facility] ORS;  Service: Urology;  Laterality: Bilateral;   CYSTOSCOPY W/ URETERAL STENT PLACEMENT Left 12/29/2016   Procedure: CYSTOSCOPY WITH STENT REPLACEMENT;  Surgeon: Penne Knee,  MD;  Location: [facility] ORS;  Service: Urology;  Laterality: Left;   CYSTOSCOPY WITH BIOPSY N/A 10/19/2016   Procedure: CYSTOSCOPY WITH BLADDER BIOPSY, VAGINAL WALL BIOPSY;  Surgeon: Penne Knee, MD;  Location: [facility] ORS;  Service: Urology;  Laterality: N/A;   CYSTOSCOPY WITH STENT PLACEMENT Left 12/14/2016   Procedure: CYSTOSCOPY WITH STENT PLACEMENT;  Surgeon: Penne Knee, MD;  Location: [facility] ORS;  Service: Urology;  Laterality: Left;   ESOPHAGOGASTRODUODENOSCOPY (EGD) WITH PROPOFOL  N/A 10/05/2016   Surgeon: Copping Rogelia, MD;  Location: [facility] ENDOSCOPY;  Results: Barrett's Esophagus- repeat in 3 years 09/2019   LEFT HEART CATH AND CORONARY ANGIOGRAPHY N/A 01/18/2019   Procedure: LEFT HEART CATH AND CORONARY ANGIOGRAPHY;  Surgeon: Dann Candyce RAMAN, MD;  Location: [facility] INVASIVE CV LAB;  Service: Cardiovascular;  Laterality: N/A;   LITHOTRIPSY     STONE EXTRACTION WITH BASKET Left 12/29/2016   Procedure: STONE EXTRACTION WITH BASKET;  Surgeon: Penne Knee, MD;  Location: [facility] ORS;  Service: Urology;  Laterality: Left;   TONSILLECTOMY     URETEROSCOPY Left 12/29/2016   Procedure: URETEROSCOPY;  Surgeon: Penne Knee, MD;  Location: [facility] ORS;  Service: Urology;  Laterality: Left;   VULVECTOMY Right 05/10/2014   Excisional biopsy of the superior right labial majora mass; [facility]   VULVECTOMY PARTIAL  07/02/2014   Re-excision and sentinel node dissection at [facility] 2.     MEDICATIONS:  atorvastatin  (LIPITOR) 40 MG tablet   Cholecalciferol (VITAMIN D3) 1000 units CAPS   DULoxetine  (CYMBALTA ) 20 MG capsule   FARXIGA  10 MG TABS tablet   fluconazole  (DIFLUCAN ) 150 MG tablet   fluticasone  (FLONASE ) 50 MCG/ACT nasal spray   glipiZIDE  (GLUCOTROL ) 10 MG tablet   irbesartan  (AVAPRO ) 75 MG tablet   metoprolol  tartrate (LOPRESSOR ) 100 MG tablet   nitroGLYCERIN  (NITROSTAT ) 0.4 MG SL tablet   oxyCODONE  (OXY IR/ROXICODONE ) 5 MG immediate release tablet   pantoprazole  (PROTONIX ) 40 MG tablet    Rivaroxaban  (XARELTO ) 15 MG TABS tablet   Semaglutide  (RYBELSUS ) 7 MG TABS   senna-docusate (SENOKOT-S) 8.6-50 MG tablet   torsemide  (DEMADEX ) 20 MG tablet   traZODone  (DESYREL ) 100 MG tablet   vitamin B-12 (CYANOCOBALAMIN ) 1000 MCG tablet   No current facility-administered medications for this encounter.    Burnard CHRISTELLA Odis DEVONNA [facility] Surgical Short Stay/Anesthesiology [facility] Phone [phone] 04/23/2024 12:32 PM       "

## 2024-04-24 ENCOUNTER — Ambulatory Visit: Payer: Self-pay | Admitting: Gynecologic Oncology

## 2024-04-24 ENCOUNTER — Ambulatory Visit (HOSPITAL_COMMUNITY): Admission: RE | Admit: 2024-04-24

## 2024-04-24 DIAGNOSIS — C519 Malignant neoplasm of vulva, unspecified: Secondary | ICD-10-CM

## 2024-04-24 LAB — GLUCOSE, CAPILLARY: Glucose-Capillary: 145 mg/dL — ABNORMAL HIGH (ref 70–99)

## 2024-04-24 MED ORDER — FLUDEOXYGLUCOSE F - 18 (FDG) INJECTION
9.4510 | Freq: Once | INTRAVENOUS | Status: AC
  Administered 2024-04-24: 9.451 via INTRAVENOUS

## 2024-04-25 ENCOUNTER — Telehealth: Payer: Self-pay | Admitting: Oncology

## 2024-04-25 ENCOUNTER — Telehealth: Payer: Self-pay | Admitting: *Deleted

## 2024-04-25 DIAGNOSIS — C519 Malignant neoplasm of vulva, unspecified: Secondary | ICD-10-CM

## 2024-04-25 NOTE — Telephone Encounter (Signed)
 Called Holly Smith discussed that Dr. Viktoria will need to do a procedure in nuclear medicine tomorrow.  Advised her she will need to apply lidocaine  to the vulvar area and arranged for her to arrive at 7:45 to the cancer center and we will meet her to give her the lidocaine  and help her apply it.  Advised the procedure in Nuc Med will be at 8:00.  She verbalized understanding and agreement and will arrive at 7:45 tomorrow.

## 2024-04-25 NOTE — Telephone Encounter (Signed)
 Telephone call to check on pre-operative status.  Patient compliant with pre-operative instructions.  Reinforced nothing to eat after midnight. Clear liquids until 0930. Patient to arrive at 1030.  No questions or concerns voiced.  Patient states last dose of xarelto  Monday 2/2 and last dose of  Farxiga  Sunday, 2/1. Instructed to call for any needs.

## 2024-04-26 ENCOUNTER — Encounter (HOSPITAL_COMMUNITY): Payer: Self-pay | Admitting: Gynecologic Oncology

## 2024-04-26 ENCOUNTER — Other Ambulatory Visit: Payer: Self-pay

## 2024-04-26 ENCOUNTER — Other Ambulatory Visit (HOSPITAL_COMMUNITY)

## 2024-04-26 ENCOUNTER — Encounter (HOSPITAL_COMMUNITY): Payer: Self-pay | Admitting: Medical

## 2024-04-26 ENCOUNTER — Encounter (HOSPITAL_COMMUNITY): Payer: Self-pay | Admitting: Anesthesiology

## 2024-04-26 ENCOUNTER — Encounter (HOSPITAL_COMMUNITY): Admission: RE | Admit: 2024-04-26

## 2024-04-26 ENCOUNTER — Encounter (HOSPITAL_COMMUNITY): Admission: RE | Payer: Self-pay | Source: Home / Self Care

## 2024-04-26 ENCOUNTER — Inpatient Hospital Stay (HOSPITAL_COMMUNITY): Admission: RE | Admit: 2024-04-26 | Source: Home / Self Care | Admitting: Gynecologic Oncology

## 2024-04-26 DIAGNOSIS — C519 Malignant neoplasm of vulva, unspecified: Secondary | ICD-10-CM

## 2024-04-26 DIAGNOSIS — E88819 Insulin resistance, unspecified: Secondary | ICD-10-CM

## 2024-04-26 DIAGNOSIS — E119 Type 2 diabetes mellitus without complications: Secondary | ICD-10-CM

## 2024-04-26 DIAGNOSIS — G4700 Insomnia, unspecified: Secondary | ICD-10-CM

## 2024-04-26 LAB — GLUCOSE, CAPILLARY
Glucose-Capillary: 150 mg/dL — ABNORMAL HIGH (ref 70–99)
Glucose-Capillary: 176 mg/dL — ABNORMAL HIGH (ref 70–99)
Glucose-Capillary: 168 mg/dL — ABNORMAL HIGH (ref 70–99)

## 2024-04-26 MED ORDER — HYDROMORPHONE HCL 2 MG/ML IJ SOLN
INTRAMUSCULAR | Status: AC
  Filled 2024-04-26: qty 1

## 2024-04-26 MED ORDER — ALBUMIN HUMAN 5 % IV SOLN: INTRAVENOUS | Status: DC | PRN

## 2024-04-26 MED ORDER — PHENYLEPHRINE HCL (PRESSORS) 10 MG/ML IV SOLN
INTRAVENOUS | Status: DC | PRN
  Administered 2024-04-26 (×2): 80 ug via INTRAVENOUS

## 2024-04-26 MED ORDER — ACETIC ACID 5 % SOLN
Status: AC
  Filled 2024-04-26: qty 12

## 2024-04-26 MED ORDER — FLUTICASONE PROPIONATE 50 MCG/ACT NA SUSP: 2.0000 | Freq: Every day | NASAL | Status: DC

## 2024-04-26 MED ORDER — ORAL CARE MOUTH RINSE: 15.0000 mL | Freq: Once | OROMUCOSAL | Status: AC

## 2024-04-26 MED ORDER — TECHNETIUM TC 99M TILMANOCEPT KIT
1.0400 | PACK | Freq: Once | INTRAVENOUS | Status: AC
  Administered 2024-04-26: 1.04 via INTRADERMAL

## 2024-04-26 MED ORDER — FENTANYL CITRATE (PF) 100 MCG/2ML IJ SOLN
INTRAMUSCULAR | Status: AC
  Filled 2024-04-26: qty 2

## 2024-04-26 MED ORDER — OXYCODONE HCL 5 MG PO TABS
5.0000 mg | ORAL_TABLET | ORAL | Status: AC | PRN
  Administered 2024-04-26 – 2024-04-27 (×2): 10 mg via ORAL
  Filled 2024-04-26 (×2): qty 2

## 2024-04-26 MED ORDER — METOPROLOL TARTRATE 50 MG PO TABS
150.0000 mg | ORAL_TABLET | Freq: Two times a day (BID) | ORAL | Status: DC
  Administered 2024-04-26: 150 mg via ORAL
  Filled 2024-04-26 (×2): qty 3

## 2024-04-26 MED ORDER — LACTATED RINGERS IV SOLN: INTRAVENOUS | Status: DC

## 2024-04-26 MED ORDER — OXYCODONE HCL 5 MG PO TABS: 5.0000 mg | ORAL_TABLET | Freq: Once | ORAL | Status: DC | PRN

## 2024-04-26 MED ORDER — SUGAMMADEX SODIUM 200 MG/2ML IV SOLN
INTRAVENOUS | Status: AC
  Filled 2024-04-26: qty 2

## 2024-04-26 MED ORDER — PHENYLEPHRINE 80 MCG/ML (10ML) SYRINGE FOR IV PUSH (FOR BLOOD PRESSURE SUPPORT)
PREFILLED_SYRINGE | INTRAVENOUS | Status: AC
  Filled 2024-04-26: qty 10

## 2024-04-26 MED ORDER — PROPOFOL 10 MG/ML IV BOLUS
INTRAVENOUS | Status: AC
  Filled 2024-04-26: qty 20

## 2024-04-26 MED ORDER — STERILE WATER FOR IRRIGATION IR SOLN
Status: DC | PRN
  Administered 2024-04-26: 1000 mL

## 2024-04-26 MED ORDER — ESMOLOL HCL 100 MG/10ML IV SOLN
INTRAVENOUS | Status: AC
  Filled 2024-04-26: qty 10

## 2024-04-26 MED ORDER — PROPOFOL 10 MG/ML IV BOLUS
INTRAVENOUS | Status: DC | PRN
  Administered 2024-04-26: 80 mg via INTRAVENOUS

## 2024-04-26 MED ORDER — ONDANSETRON HCL 4 MG/2ML IJ SOLN: 4.0000 mg | Freq: Once | INTRAMUSCULAR | Status: DC | PRN

## 2024-04-26 MED ORDER — SODIUM CHLORIDE (PF) 0.9 % IJ SOLN
INTRAMUSCULAR | Status: AC
  Filled 2024-04-26: qty 20

## 2024-04-26 MED ORDER — FENTANYL CITRATE (PF) 100 MCG/2ML IJ SOLN
INTRAMUSCULAR | Status: DC | PRN
  Administered 2024-04-26 (×4): 50 ug via INTRAVENOUS

## 2024-04-26 MED ORDER — BUPIVACAINE LIPOSOME 1.3 % IJ SUSP
INTRAMUSCULAR | Status: AC
  Filled 2024-04-26: qty 20

## 2024-04-26 MED ORDER — LABETALOL HCL 5 MG/ML IV SOLN
INTRAVENOUS | Status: AC
  Filled 2024-04-26: qty 4

## 2024-04-26 MED ORDER — HEPARIN SODIUM (PORCINE) 5000 UNIT/ML IJ SOLN
5000.0000 [IU] | INTRAMUSCULAR | Status: AC
  Administered 2024-04-26: 5000 [IU] via SUBCUTANEOUS
  Filled 2024-04-26: qty 1

## 2024-04-26 MED ORDER — TRAMADOL HCL 50 MG PO TABS: 50.0000 mg | ORAL_TABLET | Freq: Four times a day (QID) | ORAL | Status: AC | PRN

## 2024-04-26 MED ORDER — ONDANSETRON HCL 4 MG/2ML IJ SOLN
INTRAMUSCULAR | Status: AC
  Filled 2024-04-26: qty 2

## 2024-04-26 MED ORDER — BUPIVACAINE HCL (PF) 0.25 % IJ SOLN
INTRAMUSCULAR | Status: AC
  Filled 2024-04-26: qty 30

## 2024-04-26 MED ORDER — INSULIN ASPART 100 UNIT/ML IJ SOLN: 0.0000 [IU] | Freq: Three times a day (TID) | INTRAMUSCULAR | Status: DC

## 2024-04-26 MED ORDER — DEXAMETHASONE SOD PHOSPHATE PF 10 MG/ML IJ SOLN
4.0000 mg | INTRAMUSCULAR | Status: AC
  Administered 2024-04-26: 4 mg via INTRAVENOUS

## 2024-04-26 MED ORDER — ENOXAPARIN SODIUM 40 MG/0.4ML IJ SOSY
40.0000 mg | PREFILLED_SYRINGE | INTRAMUSCULAR | Status: AC
  Administered 2024-04-27: 40 mg via SUBCUTANEOUS

## 2024-04-26 MED ORDER — SODIUM CHLORIDE 0.9 % IR SOLN
Status: DC | PRN
  Administered 2024-04-26: 1000 mL

## 2024-04-26 MED ORDER — ESMOLOL HCL 100 MG/10ML IV SOLN
INTRAVENOUS | Status: DC | PRN
  Administered 2024-04-26: 20 mg via INTRAVENOUS
  Administered 2024-04-26: 10 mg via INTRAVENOUS

## 2024-04-26 MED ORDER — HYDROMORPHONE HCL 1 MG/ML IJ SOLN
INTRAMUSCULAR | Status: DC | PRN
  Administered 2024-04-26 (×3): .5 mg via INTRAVENOUS

## 2024-04-26 MED ORDER — SUGAMMADEX SODIUM 200 MG/2ML IV SOLN
INTRAVENOUS | Status: DC | PRN
  Administered 2024-04-26: 150 mg via INTRAVENOUS

## 2024-04-26 MED ORDER — NITROGLYCERIN 0.4 MG SL SUBL: 0.4000 mg | SUBLINGUAL_TABLET | SUBLINGUAL | Status: AC | PRN

## 2024-04-26 MED ORDER — EPHEDRINE 5 MG/ML INJ
INTRAVENOUS | Status: AC
  Filled 2024-04-26: qty 5

## 2024-04-26 MED ORDER — LIDOCAINE HCL (PF) 2 % IJ SOLN
INTRAMUSCULAR | Status: AC
  Filled 2024-04-26: qty 5

## 2024-04-26 MED ORDER — ONDANSETRON HCL 4 MG/2ML IJ SOLN: 4.0000 mg | Freq: Four times a day (QID) | INTRAMUSCULAR | Status: AC | PRN

## 2024-04-26 MED ORDER — SODIUM CHLORIDE 0.45 % IV SOLN: INTRAVENOUS | Status: DC

## 2024-04-26 MED ORDER — ONDANSETRON HCL 4 MG/2ML IJ SOLN
INTRAMUSCULAR | Status: DC | PRN
  Administered 2024-04-26: 4 mg via INTRAVENOUS

## 2024-04-26 MED ORDER — MEPERIDINE HCL 25 MG/ML IJ SOLN: 6.2500 mg | INTRAMUSCULAR | Status: DC | PRN

## 2024-04-26 MED ORDER — SODIUM CHLORIDE (PF) 0.9 % IJ SOLN
INTRAMUSCULAR | Status: DC | PRN
  Administered 2024-04-26 (×2): 10 mL
  Administered 2024-04-26: 40 mL

## 2024-04-26 MED ORDER — ACETAMINOPHEN 500 MG PO TABS: 1000.0000 mg | ORAL_TABLET | Freq: Once | ORAL | Status: DC

## 2024-04-26 MED ORDER — IODINE STRONG (LUGOLS) 5 % PO SOLN
ORAL | Status: AC
  Filled 2024-04-26: qty 1

## 2024-04-26 MED ORDER — SENNOSIDES-DOCUSATE SODIUM 8.6-50 MG PO TABS
2.0000 | ORAL_TABLET | Freq: Every day | ORAL | Status: AC
  Administered 2024-04-26 – 2024-04-27 (×2): 2 via ORAL
  Filled 2024-04-26 (×2): qty 2

## 2024-04-26 MED ORDER — METHYLENE BLUE (ANTIDOTE) 1 % IV SOLN
INTRAVENOUS | Status: AC
  Filled 2024-04-26: qty 10

## 2024-04-26 MED ORDER — PANTOPRAZOLE SODIUM 40 MG PO TBEC
40.0000 mg | DELAYED_RELEASE_TABLET | Freq: Every day | ORAL | Status: AC
  Administered 2024-04-27: 40 mg via ORAL
  Filled 2024-04-26: qty 1

## 2024-04-26 MED ORDER — DEXAMETHASONE SOD PHOSPHATE PF 10 MG/ML IJ SOLN
INTRAMUSCULAR | Status: AC
  Filled 2024-04-26: qty 1

## 2024-04-26 MED ORDER — LABETALOL HCL 5 MG/ML IV SOLN
10.0000 mg | Freq: Once | INTRAVENOUS | Status: AC
  Administered 2024-04-26: 10 mg via INTRAVENOUS

## 2024-04-26 MED ORDER — CELECOXIB 200 MG PO CAPS
200.0000 mg | ORAL_CAPSULE | Freq: Once | ORAL | Status: AC
  Administered 2024-04-26: 200 mg via ORAL
  Filled 2024-04-26: qty 1

## 2024-04-26 MED ORDER — CEFAZOLIN SODIUM-DEXTROSE 2-4 GM/100ML-% IV SOLN
2.0000 g | INTRAVENOUS | Status: AC
  Administered 2024-04-26: 2 g via INTRAVENOUS
  Filled 2024-04-26: qty 100

## 2024-04-26 MED ORDER — ACETAMINOPHEN 500 MG PO TABS
1000.0000 mg | ORAL_TABLET | ORAL | Status: AC
  Administered 2024-04-26: 1000 mg via ORAL
  Filled 2024-04-26: qty 2

## 2024-04-26 MED ORDER — CHLORHEXIDINE GLUCONATE 0.12 % MT SOLN
15.0000 mL | Freq: Once | OROMUCOSAL | Status: AC
  Administered 2024-04-26: 15 mL via OROMUCOSAL

## 2024-04-26 MED ORDER — ONDANSETRON HCL 4 MG PO TABS: 4.0000 mg | ORAL_TABLET | Freq: Four times a day (QID) | ORAL | Status: AC | PRN

## 2024-04-26 MED ORDER — LIDOCAINE HCL (CARDIAC) PF 100 MG/5ML IV SOSY
PREFILLED_SYRINGE | INTRAVENOUS | Status: DC | PRN
  Administered 2024-04-26: 60 mg via INTRAVENOUS

## 2024-04-26 MED ORDER — ROCURONIUM BROMIDE 10 MG/ML (PF) SYRINGE
PREFILLED_SYRINGE | INTRAVENOUS | Status: AC
  Filled 2024-04-26: qty 10

## 2024-04-26 MED ORDER — OXYCODONE HCL 5 MG/5ML PO SOLN: 5.0000 mg | Freq: Once | ORAL | Status: DC | PRN

## 2024-04-26 MED ORDER — HYDROMORPHONE HCL 1 MG/ML IJ SOLN
0.5000 mg | INTRAMUSCULAR | Status: AC | PRN
  Administered 2024-04-27: 1 mg via INTRAVENOUS
  Filled 2024-04-26: qty 1

## 2024-04-26 MED ORDER — FENTANYL CITRATE (PF) 50 MCG/ML IJ SOSY: 25.0000 ug | PREFILLED_SYRINGE | INTRAMUSCULAR | Status: DC | PRN

## 2024-04-26 MED ORDER — ROCURONIUM BROMIDE 100 MG/10ML IV SOLN
INTRAVENOUS | Status: DC | PRN
  Administered 2024-04-26: 40 mg via INTRAVENOUS

## 2024-04-26 NOTE — Treatment Plan (Signed)
 Message received from patient's RN that patient and family are not accepting of insulin . Despite my recommendation to use sliding scale if high, they do not want the patient to receive insulin . Request made that I cancel insulin  orders so that no one gives the patient insulin  inadvertently.  MARLA Dollar MD

## 2024-04-26 NOTE — Transfer of Care (Signed)
 Immediate Anesthesia Transfer of Care Note  Patient: Holly Smith  Procedure(s) Performed: LYMPH NODE BIOPSY (Bilateral: Pelvis) VULVECTOMY, WITH LYMPHADENECTOMY (Bilateral: Vulva)  Patient Location: PACU  Anesthesia Type:General  Level of Consciousness: awake, alert , oriented, and patient cooperative  Airway & Oxygen  Therapy: Patient Spontanous Breathing and Patient connected to face mask oxygen   Post-op Assessment: Report given to RN and Post -op Vital signs reviewed and stable  Post vital signs: Reviewed and stable  Last Vitals:  Vitals Value Taken Time  BP 159/106 04/26/24 15:38  Temp    Pulse 104 04/26/24 15:41  Resp 16 04/26/24 15:41  SpO2 99 % 04/26/24 15:41  Vitals shown include unfiled device data.  Last Pain:  Vitals:   04/26/24 1034  TempSrc:   PainSc: 0-No pain         Complications: No notable events documented.

## 2024-04-26 NOTE — Progress Notes (Signed)
 Patient's family granddaughter made request to not give any insulin  per sliding scale order. Patient family also requested not to give any maintenance fluids as this causes patient to swell up and cause difficulty breathing. Patient family wants to avoid maintenance fluid to prevent complications with this later after being discharge. Patient's family is aware of the orders both being discontinued and MD Viktoria aware of this situation. Will continue to monitor patient.

## 2024-04-26 NOTE — Anesthesia Preprocedure Evaluation (Addendum)
 "                                  Anesthesia Evaluation  Patient identified by MRN, date of birth, ID band Patient awake    Reviewed: Allergy & Precautions, NPO status , Patient's Chart, lab work & pertinent test results  History of Anesthesia Complications (+) PONV, Family history of anesthesia reaction and history of anesthetic complications  Airway Mallampati: III  TM Distance: >3 FB Neck ROM: Full    Dental no notable dental hx. (+) Teeth Intact, Caps, Poor Dentition   Pulmonary shortness of breath, sleep apnea , COPD, former smoker   Pulmonary exam normal breath sounds clear to auscultation       Cardiovascular hypertension, + angina  + CAD and +CHF  Normal cardiovascular exam Rhythm:Regular Rate:Normal     Neuro/Psych    GI/Hepatic ,GERD  ,,  Endo/Other  diabetes    Renal/GU Renal disease     Musculoskeletal  (+) Arthritis ,    Abdominal   Peds  Hematology   Anesthesia Other Findings   Reproductive/Obstetrics                              Anesthesia Physical Anesthesia Plan  ASA: 3  Anesthesia Plan: General   Post-op Pain Management: Tylenol  PO (pre-op)*, Celebrex  PO (pre-op)* and Minimal or no pain anticipated   Induction: Intravenous  PONV Risk Score and Plan: 4 or greater and Ondansetron , Dexamethasone , Midazolam  and Treatment may vary due to age or medical condition  Airway Management Planned: Oral ETT  Additional Equipment: None  Intra-op Plan:   Post-operative Plan: Extubation in OR  Informed Consent: I have reviewed the patients History and Physical, chart, labs and discussed the procedure including the risks, benefits and alternatives for the proposed anesthesia with the patient or authorized representative who has indicated his/her understanding and acceptance.       Plan Discussed with: Anesthesiologist and CRNA  Anesthesia Plan Comments: ( DISCUSSION: Holly Smith is an 79 yo female  with PMH of HTN, permanent A-fib on Xarelto , CAD s/p DES x2 to LAD (12/2018), HFpEF, NICM with recovered EF, mitral regurgitation, OSA on CPAP, GERD, T2DM (A1c 6.4), CKD 3, arthritis, vulvar cancer, PONV   Patient follows with Cardiology for above hx. Last seen by Dr. Wendel on 12/13/2023.  She denies any cardiac symptoms and was stable.  She was advised to restart irbesartan  and echocardiogram was ordered which was done in 12/2023 and showed normal LVEF 60 to 65%, severely dilated LA, mildly dilated RA, mild MR.  Cardiac clearance televisit done on 04/10/2024.  Cleared for surgery:   Preoperative cardiovascular examination [name]'s perioperative risk of a major cardiac event is 0.9% according to the Revised Cardiac Risk Index (RCRI).  Therefore, she is at low risk for perioperative complications.   Her functional capacity is fair at 4.06 METs according to the Duke Activity Status Index (DASI). Recommendations: According to ACC/AHA guidelines, no further cardiovascular testing needed.  The patient may proceed to surgery at acceptable risk.   Antiplatelet and/or Anticoagulation Recommendations: Xarelto  (Rivaroxaban ) can be held for 2 days prior to surgery.  Please resume post op when felt to be safe.     VS: BP (!) 140/74   Pulse 69   Temp 36.7 C (Oral)   Resp 16   Ht 5' 1 (1.549 m)  Wt 84.8 kg   SpO2 96%   BMI 35.33 kg/m       Echo 01/17/2024:   IMPRESSIONS      1. Left ventricular ejection fraction, by estimation, is 60 to 65%. Left ventricular ejection fraction by 3D volume is 63 %. The left ventricle has normal function. The left ventricle has no regional wall motion abnormalities. Left ventricular diastolic  function could not be evaluated.  2. Right ventricular systolic function is normal. The right ventricular size is normal. There is normal pulmonary artery systolic pressure.  3. Left atrial size was severely dilated.  4. Right atrial size was mildly dilated.   5. The mitral valve is normal in structure. Mild mitral valve regurgitation. No evidence of mitral stenosis.  6. The aortic valve is tricuspid. Aortic valve regurgitation is not visualized. No aortic stenosis is present.  7. The inferior vena cava is normal in size with greater than 50% respiratory variability, suggesting right atrial pressure of 3 mmHg.   Left heart cath 01/18/2019:     Prox Cx lesion is 50% stenosed. More distal mid LAD-1 lesion is 80% stenosed. A drug-eluting stent was successfully placed using a STENT SYNERGY DES 2.25X28. Post intervention, there is a 0% residual stenosis. More proximal mid LAD-2 lesion is 80% stenosed. A drug-eluting stent was successfully placed using a STENT SYNERGY DES 2.75X20 postdilated to 3.0 mm. Post intervention, there is a 0% residual stenosis. Mid RCA lesion is 25% stenosed. Acute Mrg lesion is 75% stenosed. This is a small vessel. RPDA lesion is 70% stenosed. This is a small vessel. The left ventricular systolic function is normal. LV end diastolic pressure is moderately elevated. LVEDP 26 mm Hg. The left ventricular ejection fraction is 55-65% by visual estimate. There is no aortic valve stenosis.   Restart Xarelto  tomorrow.  Continue Plavix  75 mg daily in addition to Xarelto .  To reduce bleeding risk, will hold off on continuing aspirin .     She has a fair amount of volume overload as well.  Will need to increase diuretics at least temporarily after she recovers from the cath.  This may help her breathing.  )         Anesthesia Quick Evaluation  "

## 2024-04-26 NOTE — Anesthesia Procedure Notes (Signed)
 Procedure Name: Intubation Date/Time: 04/26/2024 11:37 AM  Performed by: Nada Corean CROME, CRNAPre-anesthesia Checklist: Suction available, Emergency Drugs available, Patient identified, Patient being monitored and Timeout performed Patient Re-evaluated:Patient Re-evaluated prior to induction Oxygen  Delivery Method: Circle system utilized Preoxygenation: Pre-oxygenation with 100% oxygen  Induction Type: IV induction Ventilation: Mask ventilation without difficulty Laryngoscope Size: Glidescope Grade View: Grade I Tube type: Oral Tube size: 7.0 mm Number of attempts: 2 Airway Equipment and Method: Video-laryngoscopy Placement Confirmation: ETT inserted through vocal cords under direct vision, CO2 detector, breath sounds checked- equal and bilateral and positive ETCO2 Secured at: 21 cm Tube secured with: Tape Dental Injury: Teeth and Oropharynx as per pre-operative assessment  Comments: Attempt #1 with grade 4 view, attempt #2 with Glidescope

## 2024-04-26 NOTE — Op Note (Signed)
 Operative Report  PATIENT: Holly Smith DATE: 04/26/24  Preop Diagnosis: Recurrent vulvar cancer  Postoperative Diagnosis: same as above  Surgery: Bilateral inguinal sentinel lymph node biopsy, right inguinal lymph node dissection, partial modified radical anterior vulvectomy with clitoral ectomy  Surgeons:  Viktoria Crank, MD  Assistant: Eleanor Epps, NP  Anesthesia: General   Estimated blood loss: 250 ml  IVF:  see I&O flowsheet  Urine output: 100 ml   Complications: None apparent  Pathology: Bilateral inguinal sentinel lymph node biopsy, right blue and hot (node 363, background 39), left blue only.  Right inguinal lymph nodes.  Anterior vulva with marking stitch at 12 o'clock.  Deep and lateral margin from the right side.  Inferior margin.  Operative findings: No palpably enlarged inguinal lymph nodes.  Approximately 3 x 3 cm verrucous appearing lesion replacing the clitoris and right aspect of the clitoral tissue.  Some leukoplakia extending inferiorly.  Procedure: The patient was identified in the preoperative holding area after she had been seen in nuclear medicine for technetium 99 injection with sentinel lymph node mapping on the right.. Informed consent was signed on the chart. Patient was seen history was reviewed and exam was performed.   She was taken to the operating room where general anesthesia was achieved without difficulty. She was placed in the dorsal lithotomy position, prepped, and draped.  Foley catheter was placed.  Methylene blue  was injected in the dermis around the lesion to allow for intraoperative visual mapping.  On the right, an approximately 8-10 cm incision was made parallel to and 2 cm below the inguinal ligament; the incision was opened and the inguinal space explored.  Neoprobe was used to identify the sentinel lymph node on the right.  Methylene blue  channels were identified and noted to be leading to a blue node; this node was  identified as the sentinel lymph node with technetium as well and the node was removed. An inguinal nodal dissection was then carried out using the borders of the adductor longus tendon, sartorius tendon and inguinal ligament. The saphenous vein was identified and taken. Vascular clips were placed on larger vessels and on the methylene blue  identified lymphatic channels in the dissection bed. The wound was irrigated and hemostasis noted.  JP drain was then placed in the bed of the incision and brought out through the skin at the superior and lateral aspect.  Drain stitch was used to secure the drain.  The incision was then closed in multiple layers of vicryl taking care to re-approximate the fascia. The skin was closed using 4-0 Monocryl.  A 4-6 cm incision was made on the left approximately 2 cm below the inguinal ligament parallel to the ligament. The subcutaneous tissue was dissected and the NeoProbe used although no candidate sentinel lymph node identified.  Blue tails were identified and a blue node was removed as the sentinel lymph node.  Small clips were used to control the vessels and blue lymphatic channels were also clipped. Excellent hemostasis was noted. The wound was irrigated and hemostasis noted.  The incision was then closed in multiple layers of vicryl taking care to re-approximate the fascia. The skin was closed using 4-0 Monocryl.  Both incisions were covered in Dermabond. A sterile towel was placed over the field and attention turned to the vulva.    The anterior vulvar lesion was then identified.  Marking pen was used to draw an arrowhead appearing area around the lesion achieving at least a 2 cm margin of healthy  appearing tissue anteriorly and laterally.  Inferiorly, given some leukoplakia, marking was drawn closer to this area of leukoplakia (1 cm).  The skin was incised using a scalpel and the Bovie and handheld Ligasure device were used to achieve hemostasis circumferentially and  deep to the specimen.  The dissection was carried down to the endopelvic fascia to achieve a good margin posterior to the lesion.  The clitoral stalk itself was identified, isolated, clamped, transected, and suture-ligated using 2-0 Vicryl.  The specimen was removed and a stitch placed at 12 o'clock.  Additional medial and lateral margin was taken from the right and inferior margin was taken to help with cosmesis of the closure.  Excellent hemostasis was noted. The vulvar excision bed was then closed with multiple layers of interrupted stitches of 2-0 Vicryl suture. 3-0 Vicryl suture was used to place interrupted deep dermal sutures and the skin was re-approximated using vertical mattress sutures of 4-0 Vicryl.   All instrument, suture, laparotomy, Ray-Tec, and needle counts were correct x2. The patient tolerated the procedure well and was taken recovery room in stable condition.  Comer Dollar MD Gynecologic Oncology

## 2024-04-26 NOTE — Anesthesia Postprocedure Evaluation (Signed)
"   Anesthesia Post Note  Patient: Holly Smith  Procedure(s) Performed: LYMPH NODE BIOPSY (Bilateral: Pelvis) VULVECTOMY, WITH LYMPHADENECTOMY (Bilateral: Vulva)     Patient location during evaluation: PACU Anesthesia Type: General Level of consciousness: awake and alert Pain management: pain level controlled Vital Signs Assessment: post-procedure vital signs reviewed and stable Respiratory status: spontaneous breathing, nonlabored ventilation, respiratory function stable and patient connected to nasal cannula oxygen  Cardiovascular status: blood pressure returned to baseline and stable Postop Assessment: no apparent nausea or vomiting Anesthetic complications: no   No notable events documented.  Last Vitals:  Vitals:   04/26/24 1020  BP: (!) 148/77  Pulse: 77  Resp: 16  Temp: 36.7 C  SpO2: 95%    Last Pain:  Vitals:   04/26/24 1034  TempSrc:   PainSc: 0-No pain                 Jamyiah Labella      "

## 2024-04-26 NOTE — Interval H&P Note (Signed)
 History and Physical Interval Note:  04/26/2024 10:49 AM  Holly Smith  has presented today for surgery, with the diagnosis of RECURRENT VULVAR CANCER.  The various methods of treatment have been discussed with the patient and family. After consideration of risks, benefits and other options for treatment, the patient has consented to  Procedures with comments: LYMPH NODE BIOPSY (Bilateral) - SENTINEL LYMPH NODE BIOPSY VULVECTOMY, WITH LYMPHADENECTOMY (Bilateral) as a surgical intervention.  The patient's history has been reviewed, patient examined, no change in status, stable for surgery.  I have reviewed the patient's chart and labs.  Questions were answered to the patient's satisfaction.     Comer JONELLE Dollar

## 2024-04-26 NOTE — Brief Op Note (Signed)
 OR Date: 04/26/2024 OR Patient Start Time: 12:43 PM  3:41 PM  PATIENT:  Annita Jonne Pool  79 y.o. female  PRE-OPERATIVE DIAGNOSIS:  RECURRENT VULVAR CANCER  POST-OPERATIVE DIAGNOSIS:  RECURRENT VULVAR CANCER  PROCEDURE:  Procedures with comments: LYMPH NODE BIOPSY (Bilateral) - SENTINEL LYMPH NODE BIOPSY VULVECTOMY, WITH LYMPHADENECTOMY (Bilateral)  SURGEON:  Surgeons and Role:    DEWAINE Viktoria Comer JONELLE, MD - Primary  ASSISTANTS: Eleanor Epps, NP   ANESTHESIA:   general  EBL:  150 cc   UOP: 100 cc  BLOOD ADMINISTERED:none  DRAINS: JP drain to right groin   LOCAL MEDICATIONS USED:  Exparel  and marcaine   SPECIMEN:  bilateral inguinal SLNs, right inguinal lymph nodes, anterior vulva stitch as 12, lateral/deep margin on right, inferior margin on right  DISPOSITION OF SPECIMEN:  pathology  COUNTS:  YES  TOURNIQUET:  * No tourniquets in log *  DICTATION: .Note written in EPIC  PLAN OF CARE: Admit for overnight observation  PATIENT DISPOSITION:  PACU - hemodynamically stable.   Delay start of Pharmacological VTE agent (>24hrs) due to surgical blood loss or risk of bleeding: not applicable

## 2024-04-27 ENCOUNTER — Encounter (HOSPITAL_COMMUNITY): Payer: Self-pay | Admitting: Gynecologic Oncology

## 2024-04-27 ENCOUNTER — Other Ambulatory Visit (HOSPITAL_COMMUNITY): Payer: Self-pay

## 2024-04-27 LAB — BASIC METABOLIC PANEL WITH GFR
Anion gap: 8 (ref 5–15)
BUN: 18 mg/dL (ref 8–23)
CO2: 29 mmol/L (ref 22–32)
Calcium: 9.7 mg/dL (ref 8.9–10.3)
Chloride: 105 mmol/L (ref 98–111)
Creatinine, Ser: 1.04 mg/dL — ABNORMAL HIGH (ref 0.44–1.00)
GFR, Estimated: 54 mL/min — ABNORMAL LOW
Glucose, Bld: 136 mg/dL — ABNORMAL HIGH (ref 70–99)
Potassium: 4.4 mmol/L (ref 3.5–5.1)
Sodium: 141 mmol/L (ref 135–145)

## 2024-04-27 LAB — CBC
HCT: 39.8 % (ref 36.0–46.0)
Hemoglobin: 12.4 g/dL (ref 12.0–15.0)
MCH: 30.9 pg (ref 26.0–34.0)
MCHC: 31.2 g/dL (ref 30.0–36.0)
MCV: 99.3 fL (ref 80.0–100.0)
Platelets: 240 10*3/uL (ref 150–400)
RBC: 4.01 MIL/uL (ref 3.87–5.11)
RDW: 13.8 % (ref 11.5–15.5)
WBC: 11 10*3/uL — ABNORMAL HIGH (ref 4.0–10.5)
nRBC: 0 % (ref 0.0–0.2)

## 2024-04-27 LAB — TYPE AND SCREEN
ABO/RH(D): O NEG
Antibody Screen: NEGATIVE

## 2024-04-27 LAB — GLUCOSE, CAPILLARY
Glucose-Capillary: 129 mg/dL — ABNORMAL HIGH (ref 70–99)
Glucose-Capillary: 170 mg/dL — ABNORMAL HIGH (ref 70–99)
Glucose-Capillary: 150 mg/dL — ABNORMAL HIGH (ref 70–99)
Glucose-Capillary: 121 mg/dL — ABNORMAL HIGH (ref 70–99)

## 2024-04-27 MED ORDER — OXYCODONE HCL 5 MG PO TABS
5.0000 mg | ORAL_TABLET | Freq: Four times a day (QID) | ORAL | 0 refills | Status: AC | PRN
  Filled 2024-04-27: qty 20, 5d supply, fill #0

## 2024-04-27 MED ORDER — GLIPIZIDE 10 MG PO TABS
10.0000 mg | ORAL_TABLET | Freq: Two times a day (BID) | ORAL | Status: AC
  Administered 2024-04-27: 10 mg via ORAL
  Filled 2024-04-27: qty 1

## 2024-04-27 MED ORDER — RIVAROXABAN 10 MG PO TABS
10.0000 mg | ORAL_TABLET | Freq: Every day | ORAL | 0 refills | Status: AC
  Filled 2024-04-27: qty 7, 7d supply, fill #0

## 2024-04-27 MED ORDER — METOPROLOL TARTRATE 12.5 MG HALF TABLET
12.5000 mg | ORAL_TABLET | Freq: Two times a day (BID) | ORAL | Status: AC
  Administered 2024-04-27 (×2): 12.5 mg via ORAL
  Filled 2024-04-27 (×2): qty 1

## 2024-04-27 MED ORDER — TRAZODONE HCL 100 MG PO TABS
100.0000 mg | ORAL_TABLET | Freq: Every day | ORAL | Status: AC
  Administered 2024-04-27: 100 mg via ORAL
  Filled 2024-04-27: qty 1

## 2024-04-27 NOTE — Discharge Instructions (Addendum)
 AFTER SURGERY INSTRUCTIONS   Return to work: 4-6 weeks if applicable   You will be discharged home with a drain in place coming from the right groin. You will need to empty this drain several times a day and keep a record of the output.  You will also be discharged home with the foley catheter in place in your bladder. You will need to empty this as well.   You will need to monitor your blood pressure at home and contact your PCP/cardiologist if your values are running low to get recommendations on your medications.   You will need to be on a reduced dose (10 mg) of xarelto  for 7 days after surgery. We have sent this to the pharmacy. At the 7 day mark, our office will give you a call. If you are having no bleeding, you can increase the dose back to your regular full dose (15 mg).    Activity: 1. Be up and out of the bed during the day.  Take a nap if needed.  You may walk up steps but be careful and use the hand rail.  Stair climbing will tire you more than you think, you may need to stop part way and rest.    2. No lifting or straining for 6 weeks over 10 pounds. No pushing, pulling, straining for 6 weeks.   3. No driving for 4-89 days when the following criteria have been met: Do not drive if you are taking narcotic pain medicine and make sure that your reaction time has returned.    4. You can shower as soon as the next day after surgery. Shower daily.  Use your regular soap and water  (not directly on the incision) and pat your incision(s) dry afterwards; don't rub.  No tub baths or submerging your body in water  until cleared by your surgeon. If you have the soap that was given to you by pre-surgical testing that was used before surgery, you do not need to use it afterwards because this can irritate your incisions.    5. No sexual activity and nothing in the vagina for 6 weeks.   6. You may experience a small amount of clear drainage from your incisions, which is normal.  If the drainage  persists, increases, or changes color please call the office.   7. Do not use creams, lotions, or ointments such as neosporin on your incisions after surgery until advised by your surgeon because they can cause removal of the dermabond glue on your incisions.     8. You may experience vulvar spotting after surgery.  The spotting is normal but if you experience heavy bleeding, call our office.   9. Take Tylenol  first for pain if you are able to take these medication and only use narcotic pain medication for severe pain not relieved by the Tylenol .  Monitor your Tylenol  intake to a max of 4,000 mg in a 24 hour period.    Diet: 1. Low sodium Heart Healthy Diet is recommended but you are cleared to resume your normal (before surgery) diet after your procedure.   2. It is safe to use a laxative, such as Miralax  or Colace, if you have difficulty moving your bowels before surgery. You have been prescribed Sennakot-S to take at bedtime every evening after surgery to keep bowel movements regular and to prevent constipation.     Wound Care: 1. Keep clean and dry.  Shower daily.   Reasons to call the Doctor: Fever - Oral  temperature greater than 100.4 degrees Fahrenheit Foul-smelling vaginal discharge Difficulty urinating Nausea and vomiting Increased pain at the site of the incision that is unrelieved with pain medicine. Difficulty breathing with or without chest pain New calf pain especially if only on one side Sudden, continuing increased vaginal bleeding with or without clots.   Contacts: For questions or concerns you should contact:   Dr. Comer Dollar at [phone]   Eleanor Epps, NP at [phone]   After Hours: call [phone] and have the GYN Oncologist paged/contacted (after 5 pm or on the weekends). You will speak with an after hours RN and let he or she know you have had surgery.   Messages sent via mychart are for non-urgent matters and are not responded to after hours  so for urgent needs, please call the after hours number.

## 2024-04-27 NOTE — Progress Notes (Signed)
 GYN Oncology Progress Note  Patient is alert, oriented, resting in bed, with son at the bedside.  She reports overall doing well.  She is tolerating her diet with no nausea or emesis.  She ambulated in the halls and felt steady without lightheadedness. Vulvar pain is controlled with oral medications.  After discussion the patient and her family feel most comfortable with her staying another night for continued pain management, BP monitoring, urine output monitoring, and drain care.  Bilateral groin incisions are intact with dermabond with no drainage.  The right JP drain was emptied with serosanguinous drainage.  The Foley catheter was emptied with 150 cc of urine.  The vulvar incision was assessed due to patient stating she felt like there was an odor and she may have had a bowel movement.  The incision itself is intact.  Mild mons edema.  No stool present.  SCDs connected and power at home.  We will plan to continue with the current plan of care.  Plan for discharge tomorrow.  Patient will need drain and Foley teaching.

## 2024-04-27 NOTE — Progress Notes (Signed)
 1 Day Post-Op Procedures (LRB): LYMPH NODE BIOPSY (Bilateral) VULVECTOMY, WITH LYMPHADENECTOMY (Bilateral)  Subjective: Patient reports ambulating in the halls with assist last pm and tolerating this well. Vulvar pain when coughing and with movement with this being managed with oral pain medications. Denies chest pain, dyspnea. Tolerated a sandwich last pm. Drank water  and ginger ale last pm. No nausea or emesis. Feels she was given her BP med last pm but advised this was not ordered (only lopressor  ordered). Passing flatus.    Objective: Vital signs in last 24 hours: Temp:  [97.4 F (36.3 C)-98.5 F (36.9 C)] 97.4 F (36.3 C) (02/06 0528) Pulse Rate:  [70-108] 85 (02/06 0528) Resp:  [14-18] 18 (02/06 0528) BP: (114-162)/(55-110) 114/55 (02/06 0528) SpO2:  [93 %-99 %] 96 % (02/06 0528) Weight:  [186 lb 1.1 oz (84.4 kg)] 186 lb 1.1 oz (84.4 kg) (02/05 1854) Last BM Date : 04/25/24  Intake/Output from previous day: 02/05 0701 - 02/06 0700 In: 1680.7 [P.O.:120; I.V.:1210.7; IV Piggyback:350] Out: 467 [Urine:410; Drains:57]  Physical Examination: General: alert, cooperative, and no distress Resp: clear to auscultation bilaterally Cardio: irregular beats noted intermittently (hx a fib) GI: soft, non-tender; bowel sounds normal; no masses,  no organomegaly Extremities: extremities normal, atraumatic, no cyanosis or edema SCD machine currently off Right groin incision intact with dermabond with no active drainage. Drain dressing around right JP drain intact. JP with serosanguinous drainage. Left inguinal incision is intact with no drainage. Vulvar incision intact without excessive drainage.   Labs: WBC/Hgb/Hct/Plts:  11.0/12.4/39.8/240 (02/06 0535) BUN/Cr/glu/ALT/AST/amyl/lip:  18/1.04/--/--/--/--/-- (02/06 0535)  Assessment: 79 y.o. s/p Procedures: LYMPH NODE BIOPSY VULVECTOMY, WITH LYMPHADENECTOMY: stable Pain:  Pain is well-controlled on PRN medications.  Heme: Hgb 12.4 and Hct  39.8 this am. Appropriate given preop values and surgical losses.  ID: WBC 11.0. Given decadron  and intra-op antibiotics. No sign of infection at this time.  CV: Mildly hypotensive. Continue to monitor. Encouraged oral intake. Pt typically takes diuretic but given BP will hold at this time.   GI:  Tolerating po: yes. Antiemetics ordered if needed.  GU: Foley in place. Hx CKD. Creatinine at 1.04 this am. Decreased urine output overnight. Patient refused IV fluids ordered post-op. Will continue to monitor. Plan for discharge home with foley in place.      FEN: No critical values.  Endo: Hx Diabetes. CBG (last 3)  Recent Labs    04/26/24 1025 04/26/24 1554 04/26/24 1824  GLUCAP 150* 176* 168*  Pt refusing sliding scale coverage.  Prophylaxis: SCDs and lovenox  ordered.  Plan: Monitor BP and urine output this am.  O2 removed this am and staff to recheck. Plan for probable discharge home later this afternoon if the above improve. Encourage increasing mobility with assist. Continue with current plan of care.  Plan for discharge home with JP and foley in place. Patient will need teaching and supplies.   LOS: 1 day    Alonte Wulff D Justyna Timoney 04/27/2024, 8:08 AM

## 2024-05-03 ENCOUNTER — Inpatient Hospital Stay: Attending: Gynecologic Oncology | Admitting: Gynecologic Oncology

## 2024-05-17 ENCOUNTER — Inpatient Hospital Stay: Admitting: Gynecologic Oncology

## 2024-09-05 ENCOUNTER — Ambulatory Visit

## 2024-10-17 ENCOUNTER — Telehealth: Admitting: Neurology
